# Patient Record
Sex: Male | Born: 1960
Health system: Southern US, Community
[De-identification: ages and names within clinical notes are randomized; demographics above are authoritative.]

## PROBLEM LIST (undated history)

## (undated) DIAGNOSIS — R011 Cardiac murmur, unspecified: Secondary | ICD-10-CM

## (undated) DIAGNOSIS — G4730 Sleep apnea, unspecified: Secondary | ICD-10-CM

## (undated) DIAGNOSIS — E785 Hyperlipidemia, unspecified: Secondary | ICD-10-CM

## (undated) DIAGNOSIS — K219 Gastro-esophageal reflux disease without esophagitis: Secondary | ICD-10-CM

## (undated) DIAGNOSIS — E119 Type 2 diabetes mellitus without complications: Secondary | ICD-10-CM

## (undated) DIAGNOSIS — D509 Iron deficiency anemia, unspecified: Secondary | ICD-10-CM

## (undated) DIAGNOSIS — R21 Rash and other nonspecific skin eruption: Secondary | ICD-10-CM

## (undated) DIAGNOSIS — I10 Essential (primary) hypertension: Secondary | ICD-10-CM

## (undated) HISTORY — DX: Gastro-esophageal reflux disease without esophagitis: K21.9

## (undated) HISTORY — DX: Hyperlipidemia, unspecified: E78.5

## (undated) HISTORY — PX: HAND SURGERY: SHX662

## (undated) HISTORY — DX: Iron deficiency anemia, unspecified: D50.9

## (undated) HISTORY — DX: Sleep apnea, unspecified: G47.30

---

## 1991-10-17 HISTORY — PX: BACK SURGERY: SHX140

## 1998-10-16 HISTORY — PX: CERVICAL SPINE SURGERY: SHX589

## 2001-03-04 ENCOUNTER — Encounter: Payer: Self-pay | Admitting: Neurosurgery

## 2001-03-04 ENCOUNTER — Inpatient Hospital Stay (HOSPITAL_COMMUNITY): Admission: RE | Admit: 2001-03-04 | Discharge: 2001-03-05 | Payer: Self-pay | Admitting: Neurosurgery

## 2001-04-02 ENCOUNTER — Encounter: Payer: Self-pay | Admitting: Neurosurgery

## 2001-04-02 ENCOUNTER — Encounter: Admission: RE | Admit: 2001-04-02 | Discharge: 2001-04-02 | Payer: Self-pay | Admitting: Neurosurgery

## 2001-06-04 ENCOUNTER — Encounter: Payer: Self-pay | Admitting: Neurosurgery

## 2001-06-04 ENCOUNTER — Encounter: Admission: RE | Admit: 2001-06-04 | Discharge: 2001-06-04 | Payer: Self-pay | Admitting: Neurosurgery

## 2003-10-27 ENCOUNTER — Encounter: Admission: RE | Admit: 2003-10-27 | Discharge: 2003-11-19 | Payer: Self-pay | Admitting: *Deleted

## 2003-11-18 ENCOUNTER — Ambulatory Visit (HOSPITAL_COMMUNITY): Admission: RE | Admit: 2003-11-18 | Discharge: 2003-11-18 | Payer: Self-pay | Admitting: Specialist

## 2005-01-23 ENCOUNTER — Emergency Department (HOSPITAL_COMMUNITY): Admission: EM | Admit: 2005-01-23 | Discharge: 2005-01-23 | Payer: Self-pay | Admitting: Family Medicine

## 2005-03-19 ENCOUNTER — Emergency Department (HOSPITAL_COMMUNITY): Admission: EM | Admit: 2005-03-19 | Discharge: 2005-03-19 | Payer: Self-pay | Admitting: Emergency Medicine

## 2011-08-03 ENCOUNTER — Emergency Department (HOSPITAL_COMMUNITY)
Admission: EM | Admit: 2011-08-03 | Discharge: 2011-08-04 | Disposition: A | Discharge status: Home / Self Care | Payer: Self-pay | Attending: Emergency Medicine | Admitting: Emergency Medicine

## 2011-08-03 ENCOUNTER — Emergency Department (HOSPITAL_COMMUNITY): Payer: Self-pay

## 2011-08-03 DIAGNOSIS — I10 Essential (primary) hypertension: Secondary | ICD-10-CM | POA: Insufficient documentation

## 2011-08-03 DIAGNOSIS — R55 Syncope and collapse: Secondary | ICD-10-CM | POA: Insufficient documentation

## 2011-08-03 DIAGNOSIS — F29 Unspecified psychosis not due to a substance or known physiological condition: Secondary | ICD-10-CM | POA: Insufficient documentation

## 2011-08-03 DIAGNOSIS — R569 Unspecified convulsions: Secondary | ICD-10-CM | POA: Insufficient documentation

## 2011-08-03 DIAGNOSIS — R51 Headache: Secondary | ICD-10-CM | POA: Insufficient documentation

## 2011-08-03 HISTORY — DX: Essential (primary) hypertension: I10

## 2011-08-03 LAB — COMPREHENSIVE METABOLIC PANEL
ALT: 46 U/L (ref 0–53)
AST: 35 U/L (ref 0–37)
Alkaline Phosphatase: 74 U/L (ref 39–117)
CO2: 26 mEq/L (ref 19–32)
Chloride: 98 mEq/L (ref 96–112)
Creatinine, Ser: 0.62 mg/dL (ref 0.50–1.35)
GFR calc non Af Amer: >90 mL/min (ref >90)
Potassium: 3.9 mEq/L (ref 3.5–5.1)
Sodium: 135 mEq/L (ref 135–145)
Total Bilirubin: 0.4 mg/dL (ref 0.3–1.2)

## 2011-08-03 LAB — CBC
HCT: 46.1 % (ref 39.0–52.0)
Hemoglobin: 16.3 g/dL (ref 13.0–17.0)
MCH: 31.1 pg (ref 26.0–34.0)
MCHC: 35.4 g/dL (ref 30.0–36.0)
MCV: 88 fL (ref 78.0–100.0)
Platelets: 162 10*3/uL (ref 150–400)
RBC: 5.24 MIL/uL (ref 4.22–5.81)
RDW: 12.2 % (ref 11.5–15.5)
WBC: 6.4 10*3/uL (ref 4.0–10.5)

## 2011-08-03 LAB — DIFFERENTIAL
Basophils Absolute: 0.1 10*3/uL (ref 0.0–0.1)
Basophils Relative: 1 % (ref 0–1)
Eosinophils Absolute: 0.3 10*3/uL (ref 0.0–0.7)
Eosinophils Relative: 4 % (ref 0–5)
Lymphocytes Relative: 32 % (ref 12–46)
Lymphs Abs: 2 10*3/uL (ref 0.7–4.0)
Monocytes Absolute: 0.6 10*3/uL (ref 0.1–1.0)
Monocytes Relative: 10 % (ref 3–12)
Neutro Abs: 3.5 10*3/uL (ref 1.7–7.7)
Neutrophils Relative %: 54 % (ref 43–77)

## 2011-08-04 ENCOUNTER — Encounter (HOSPITAL_COMMUNITY): Payer: Self-pay | Admitting: Radiology

## 2013-04-02 ENCOUNTER — Emergency Department (HOSPITAL_COMMUNITY): Payer: Self-pay

## 2013-04-02 ENCOUNTER — Emergency Department (HOSPITAL_COMMUNITY)
Admission: EM | Admit: 2013-04-02 | Discharge: 2013-04-02 | Disposition: A | Discharge status: Home / Self Care | Payer: Self-pay | Attending: Emergency Medicine | Admitting: Emergency Medicine

## 2013-04-02 ENCOUNTER — Encounter (HOSPITAL_COMMUNITY): Payer: Self-pay | Admitting: Emergency Medicine

## 2013-04-02 DIAGNOSIS — I10 Essential (primary) hypertension: Secondary | ICD-10-CM | POA: Insufficient documentation

## 2013-04-02 DIAGNOSIS — Z79899 Other long term (current) drug therapy: Secondary | ICD-10-CM | POA: Insufficient documentation

## 2013-04-02 DIAGNOSIS — N281 Cyst of kidney, acquired: Secondary | ICD-10-CM

## 2013-04-02 DIAGNOSIS — Q619 Cystic kidney disease, unspecified: Secondary | ICD-10-CM | POA: Insufficient documentation

## 2013-04-02 DIAGNOSIS — N200 Calculus of kidney: Secondary | ICD-10-CM | POA: Insufficient documentation

## 2013-04-02 DIAGNOSIS — R109 Unspecified abdominal pain: Secondary | ICD-10-CM | POA: Insufficient documentation

## 2013-04-02 DIAGNOSIS — E119 Type 2 diabetes mellitus without complications: Secondary | ICD-10-CM | POA: Insufficient documentation

## 2013-04-02 DIAGNOSIS — R110 Nausea: Secondary | ICD-10-CM | POA: Insufficient documentation

## 2013-04-02 DIAGNOSIS — Z791 Long term (current) use of non-steroidal anti-inflammatories (NSAID): Secondary | ICD-10-CM | POA: Insufficient documentation

## 2013-04-02 HISTORY — DX: Type 2 diabetes mellitus without complications: E11.9

## 2013-04-02 LAB — CBC WITH DIFFERENTIAL/PLATELET
Basophils Absolute: 0.1 10*3/uL (ref 0.0–0.1)
Basophils Relative: 1 % (ref 0–1)
Eosinophils Relative: 5 % (ref 0–5)
HCT: 44.3 % (ref 39.0–52.0)
Hemoglobin: 15.7 g/dL (ref 13.0–17.0)
Lymphocytes Relative: 36 % (ref 12–46)
MCHC: 35.4 g/dL (ref 30.0–36.0)
MCV: 87 fL (ref 78.0–100.0)
Monocytes Absolute: 0.6 10*3/uL (ref 0.1–1.0)
Monocytes Relative: 8 % (ref 3–12)
RDW: 12.1 % (ref 11.5–15.5)

## 2013-04-02 LAB — COMPREHENSIVE METABOLIC PANEL
AST: 25 U/L (ref 0–37)
BUN: 14 mg/dL (ref 6–23)
CO2: 24 mEq/L (ref 19–32)
Calcium: 9.5 mg/dL (ref 8.4–10.5)
Chloride: 100 mEq/L (ref 96–112)
Creatinine, Ser: 0.64 mg/dL (ref 0.50–1.35)
GFR calc non Af Amer: >90 mL/min (ref >90)
Total Bilirubin: 0.3 mg/dL (ref 0.3–1.2)

## 2013-04-02 LAB — LIPASE, BLOOD: Lipase: 54 U/L (ref 11–59)

## 2013-04-02 LAB — URINALYSIS, ROUTINE W REFLEX MICROSCOPIC
Bilirubin Urine: NEGATIVE
Glucose, UA: >1000 mg/dL — AB
Hgb urine dipstick: NEGATIVE
Ketones, ur: NEGATIVE mg/dL
Protein, ur: NEGATIVE mg/dL
Urobilinogen, UA: 0.2 mg/dL (ref 0.0–1.0)

## 2013-04-02 MED ORDER — TAMSULOSIN HCL 0.4 MG PO CAPS: 0.4000 mg | ORAL_CAPSULE | Freq: Two times a day (BID) | ORAL | Status: DC

## 2013-04-02 MED ORDER — HYDROCODONE-ACETAMINOPHEN 5-325 MG PO TABS: 1.0000 | ORAL_TABLET | Freq: Four times a day (QID) | ORAL | Status: DC | PRN

## 2013-04-02 MED ORDER — SODIUM CHLORIDE 0.9 % IV SOLN
Freq: Once | INTRAVENOUS | Status: AC
  Administered 2013-04-02: 02:00:00 via INTRAVENOUS

## 2013-04-02 MED ORDER — FENTANYL CITRATE 0.05 MG/ML IJ SOLN
50.0000 ug | Freq: Once | INTRAMUSCULAR | Status: AC
  Administered 2013-04-02: 50 ug via INTRAVENOUS
  Filled 2013-04-02: qty 2

## 2013-04-02 NOTE — ED Provider Notes (Signed)
History     CSN: [id number]  Arrival date & time 04/02/13  0005   First MD Initiated Contact with Patient 04/02/13 0157      Chief Complaint  Patient presents with  . Flank Pain    (Consider location/radiation/quality/duration/timing/severity/associated sxs/prior treatment) HPI Comments: R flank pain that radiates to R testicle pain waxes and wanes in intensity    Patient is a 52 y.o. male presenting with flank pain. The history is provided by the patient.  Flank Pain This is a recurrent problem. The current episode started yesterday. The problem occurs constantly. The problem has been gradually worsening. Associated symptoms include nausea. Pertinent negatives include no abdominal pain, change in bowel habit, congestion, coughing, diaphoresis, fever, headaches, myalgias, rash, urinary symptoms, vertigo or weakness. Nothing aggravates the symptoms. He has tried nothing for the symptoms. The treatment provided no relief.    Past Medical History  Diagnosis Date  . Hypertension   . Diabetes mellitus without complication     Past Surgical History  Procedure Laterality Date  . Back surgery    . Cervical spine surgery    . Hand surgery      Family History  Problem Relation Age of Onset  . CAD Other   . Cancer Other     History  Substance Use Topics  . Smoking status: Never Smoker   . Smokeless tobacco: Not on file  . Alcohol Use: No      Review of Systems  Constitutional: Negative for fever and diaphoresis.  HENT: Negative for congestion.   Respiratory: Negative for cough.   Gastrointestinal: Positive for nausea. Negative for abdominal pain and change in bowel habit.  Genitourinary: Positive for flank pain.  Musculoskeletal: Negative for myalgias.  Skin: Negative for rash.  Neurological: Negative for vertigo, weakness and headaches.    Allergies  Review of patient's allergies indicates no known allergies.  Home Medications   Current Outpatient Rx  Name   Route  Sig  Dispense  Refill  . loratadine (CLARITIN) 10 MG tablet   Oral   Take 10 mg by mouth daily.         . naproxen sodium (ANAPROX) 220 MG tablet   Oral   Take 440 mg by mouth 2 (two) times daily with a meal.         . potassium chloride SA (K-DUR,KLOR-CON) 20 MEQ tablet   Oral   Take 20 mEq by mouth 2 (two) times daily.         . ranitidine (ZANTAC) 75 MG tablet   Oral   Take 75 mg by mouth every evening.         Marland Kitchen HYDROcodone-acetaminophen (NORCO/VICODIN) 5-325 MG per tablet   Oral   Take 1 tablet by mouth every 6 (six) hours as needed for pain.   20 tablet   0     BP 141/58  Pulse 69  Temp(Src) 97.5 F (36.4 C) (Oral)  Resp 20  Wt 235 lb (106.595 kg)  SpO2 99%  Physical Exam  Nursing note and vitals reviewed. Constitutional: He appears well-developed and well-nourished.  HENT:  Head: Normocephalic.  Eyes: Pupils are equal, round, and reactive to light.  Neck: Normal range of motion.  Cardiovascular: Normal rate and regular rhythm.   Pulmonary/Chest: Effort normal and breath sounds normal.  Abdominal: Soft. Bowel sounds are normal. He exhibits no distension. There is no tenderness.  Musculoskeletal: Normal range of motion.  Neurological: He is alert.  Skin: Skin is warm.  ED Course  Procedures (including critical care time)  Labs Reviewed  URINALYSIS, ROUTINE W REFLEX MICROSCOPIC - Abnormal; Notable for the following:    Specific Gravity, Urine 1.045 (*)    Glucose, UA >1000 (*)    All other components within normal limits  COMPREHENSIVE METABOLIC PANEL - Abnormal; Notable for the following:    Glucose, Bld 293 (*)    All other components within normal limits  URINE MICROSCOPIC-ADD ON  CBC WITH DIFFERENTIAL  LIPASE, BLOOD   Ct Abdomen Wo Contrast  04/02/2013   *RADIOLOGY REPORT*  Clinical Data:  Right-sided flank pain.  CT ABDOMEN AND PELVIS WITHOUT CONTRAST  Technique:  Multidetector CT imaging of the abdomen and pelvis was performed  following the standard protocol without intravenous contrast.  Comparison:  No priors.  Findings:  Lung Bases: Atherosclerosis in the mid and distal right coronary artery and the left circumflex coronary artery.  Abdomen/Pelvis:  2 mm nonobstructive calculus in the upper pole collecting system of the left kidney.  No additional calculi are noted within the collecting system of the right kidney, along the course of either ureter, or within the lumen of the urinary bladder.  No hydroureteronephrosis or perinephric stranding to suggest urinary tract obstruction at this time.  A 1.7 cm low attenuation lesion in the interpolar region of the right kidney is incompletely characterized on today's noncontrast CT examination, but may represent a cyst.  The unenhanced appearance of the liver, gallbladder, pancreas, spleen and bilateral adrenal glands is unremarkable.  Normal appendix.  There are a few scattered colonic diverticula, without surrounding inflammatory changes to suggest acute diverticulitis at this time.  No significant volume of ascites.  No pneumoperitoneum. No pathologic distension of small bowel.  No definite pathologic lymphadenopathy identified within the abdomen or pelvis on today's noncontrast CT examination.  Prostate gland and urinary bladder are unremarkable.  Musculoskeletal: There are no aggressive appearing lytic or blastic lesions noted in the visualized portions of the skeleton.  IMPRESSION: 1.  2 mm nonobstructive calculus in the upper pole collecting system of the left kidney. 2.  No ureteral stones or findings of urinary tract obstruction at this time. 3.  Normal appendix. 4.  Mild colonic diverticulosis without evidence to suggest acute diverticulitis at this time. 5.  Atherosclerosis, including at least two-vessel coronary artery disease, as above. Please note that although the presence of coronary artery calcium documents the presence of coronary artery disease, the severity of this disease  and any potential stenosis cannot be assessed on this non-gated CT examination.  Assessment for potential risk factor modification, dietary therapy or pharmacologic therapy may be warranted, if clinically indicated.  6.  1.7 cm low attenuation area in the interpolar region of the right kidney is favored to represent a cyst, but is incompletely characterized on today's noncontrast CT examination.  This could be further evaluated with non emergent renal ultrasound if of clinical concern.   Original Report Authenticated By: Trudie Reed, M.D.     1. Renal cyst   2. Flank pain   3. Kidney stone       MDM    Will DC home with Hydrocodone and FU with urology        Arman Filter, NP 04/02/13 [phone]

## 2013-04-02 NOTE — ED Notes (Signed)
Pt states he is having right flank pain that started yesterday but became very severe about an hour ago  Pt has history of prostatitis but states this pain is worse than that

## 2013-04-04 NOTE — ED Provider Notes (Signed)
Medical screening examination/treatment/procedure(s) were performed by non-physician practitioner and as supervising physician I was immediately available for consultation/collaboration.  Sunnie Nielsen, MD 04/04/13 [phone]

## 2014-04-09 ENCOUNTER — Ambulatory Visit: Payer: Self-pay

## 2015-05-08 ENCOUNTER — Encounter (HOSPITAL_COMMUNITY): Payer: Self-pay | Admitting: Emergency Medicine

## 2015-05-08 ENCOUNTER — Emergency Department (HOSPITAL_COMMUNITY): Payer: Self-pay

## 2015-05-08 ENCOUNTER — Emergency Department (HOSPITAL_COMMUNITY)
Admission: EM | Admit: 2015-05-08 | Discharge: 2015-05-08 | Disposition: A | Discharge status: Home / Self Care | Payer: Self-pay | Attending: Emergency Medicine | Admitting: Emergency Medicine

## 2015-05-08 DIAGNOSIS — G8929 Other chronic pain: Secondary | ICD-10-CM | POA: Insufficient documentation

## 2015-05-08 DIAGNOSIS — R110 Nausea: Secondary | ICD-10-CM | POA: Insufficient documentation

## 2015-05-08 DIAGNOSIS — R0602 Shortness of breath: Secondary | ICD-10-CM | POA: Insufficient documentation

## 2015-05-08 DIAGNOSIS — E119 Type 2 diabetes mellitus without complications: Secondary | ICD-10-CM | POA: Insufficient documentation

## 2015-05-08 DIAGNOSIS — F1123 Opioid dependence with withdrawal: Secondary | ICD-10-CM | POA: Insufficient documentation

## 2015-05-08 DIAGNOSIS — R002 Palpitations: Secondary | ICD-10-CM | POA: Insufficient documentation

## 2015-05-08 DIAGNOSIS — Z791 Long term (current) use of non-steroidal anti-inflammatories (NSAID): Secondary | ICD-10-CM | POA: Insufficient documentation

## 2015-05-08 DIAGNOSIS — F1193 Opioid use, unspecified with withdrawal: Secondary | ICD-10-CM

## 2015-05-08 DIAGNOSIS — I10 Essential (primary) hypertension: Secondary | ICD-10-CM | POA: Insufficient documentation

## 2015-05-08 DIAGNOSIS — F419 Anxiety disorder, unspecified: Secondary | ICD-10-CM | POA: Insufficient documentation

## 2015-05-08 DIAGNOSIS — Z87891 Personal history of nicotine dependence: Secondary | ICD-10-CM | POA: Insufficient documentation

## 2015-05-08 DIAGNOSIS — Z79899 Other long term (current) drug therapy: Secondary | ICD-10-CM | POA: Insufficient documentation

## 2015-05-08 LAB — I-STAT TROPONIN, ED: TROPONIN I, POC: 0 ng/mL (ref 0.00–0.08)

## 2015-05-08 LAB — BASIC METABOLIC PANEL
Anion gap: 7 (ref 5–15)
BUN: 10 mg/dL (ref 6–20)
CALCIUM: 9.3 mg/dL (ref 8.9–10.3)
CO2: 26 mmol/L (ref 22–32)
CREATININE: 0.73 mg/dL (ref 0.61–1.24)
Chloride: 102 mmol/L (ref 101–111)
GFR calc Af Amer: >60 mL/min (ref >60)
Glucose, Bld: 251 mg/dL — ABNORMAL HIGH (ref 65–99)
POTASSIUM: 3.9 mmol/L (ref 3.5–5.1)
Sodium: 135 mmol/L (ref 135–145)

## 2015-05-08 LAB — CBC
HEMATOCRIT: 42.2 % (ref 39.0–52.0)
Hemoglobin: 14.8 g/dL (ref 13.0–17.0)
MCH: 30.9 pg (ref 26.0–34.0)
MCHC: 35.1 g/dL (ref 30.0–36.0)
MCV: 88.1 fL (ref 78.0–100.0)
Platelets: 163 10*3/uL (ref 150–400)
RBC: 4.79 MIL/uL (ref 4.22–5.81)
RDW: 12.4 % (ref 11.5–15.5)
WBC: 6 10*3/uL (ref 4.0–10.5)

## 2015-05-08 MED ORDER — GI COCKTAIL ~~LOC~~
30.0000 mL | Freq: Once | ORAL | Status: DC
  Filled 2015-05-08: qty 30

## 2015-05-08 MED ORDER — CLONIDINE HCL 0.1 MG PO TABS: 0.1000 mg | ORAL_TABLET | Freq: Two times a day (BID) | ORAL | Status: DC

## 2015-05-08 NOTE — ED Notes (Signed)
PT would not let me finish sticking him for an IV and labs.

## 2015-05-08 NOTE — ED Notes (Signed)
Pt states "I hope you can drive the wheelchair better than you stick."

## 2015-05-08 NOTE — ED Notes (Signed)
Unsuccessful attempt at IV during first attempt. Patient declined 2nd attempt at this time.

## 2015-05-08 NOTE — ED Notes (Signed)
Patient reports reaction to chemical trial drug "[id number] in opioid naive subjects with moderate to severe chronic low back pain". Patient reports the last time he took this medication was May 04, 2015. Side effects: chest pain, heart racing, feeling anxious. Reports pain on left side of chest, in a dull nature.

## 2015-05-08 NOTE — ED Notes (Signed)
Pt returned from XRAY. Phlebotomy aware he needs labs drawn.

## 2015-05-08 NOTE — ED Provider Notes (Addendum)
CSN: [id number]     Arrival date & time 05/08/15  7829 History   First MD Initiated Contact with Patient 05/08/15 [phone]     Chief Complaint  Patient presents with  . Chest Pain  . Anxiety     (Consider location/radiation/quality/duration/timing/severity/associated sxs/prior Treatment) HPI Comments: Patient is a 54 year old male with a prior history of chronic back pain who sees pain management. On June 25 started a new trial medication of an opiate-based medication that was not supposed to cross the blood-brain barrier. He had been increasing the dose slowly as directed by his physicians and then on July 17 developed a rash in his upper extremities feeling like his throat was closing and concern for an allergic reaction so he stopped the medication. He took one Benadryl and his symptoms resolved. A day later he started to notice a racing heart, feeling anxious, intermittent diaphoresis and occasional nausea. This is been ongoing for the last 4 days. This morning he woke up at 5 AM with sweating and now some left-sided chest pain. The chest pain has been persistent for over 2 hours now without any exacerbating symptoms. He currently is only taking Aleve and Tylenol. He also denies taking any type of blood pressure medication and has not taken anymore of the trial medication.  Patient is a 54 y.o. male presenting with chest pain and anxiety. The history is provided by the patient.  Chest Pain Pain location:  L chest Pain quality: aching   Pain radiates to:  Does not radiate Pain radiates to the back: no   Pain severity:  Moderate Onset quality:  Sudden Duration:  2 hours Timing:  Constant Progression:  Unchanged Chronicity:  New Relieved by:  None tried Worsened by:  Nothing tried Ineffective treatments:  None tried Associated symptoms: anxiety, diaphoresis, nausea, palpitations and shortness of breath   Associated symptoms: no cough, no dizziness, no dysphagia, no fever, no lower extremity  edema and not vomiting   Risk factors: male sex   Risk factors: no coronary artery disease, not obese, no smoking and no surgery   Risk factors comment:  No family hx of MI Anxiety Associated symptoms include chest pain and shortness of breath.    Past Medical History  Diagnosis Date  . Hypertension   . Diabetes mellitus without complication    Past Surgical History  Procedure Laterality Date  . Back surgery    . Cervical spine surgery    . Hand surgery     Family History  Problem Relation Age of Onset  . CAD Other   . Cancer Other    History  Substance Use Topics  . Smoking status: Former Smoker    Quit date: 10/16/1988  . Smokeless tobacco: Not on file  . Alcohol Use: No    Review of Systems  Constitutional: Positive for diaphoresis. Negative for fever.  HENT: Negative for trouble swallowing.   Respiratory: Positive for shortness of breath. Negative for cough.   Cardiovascular: Positive for chest pain and palpitations.  Gastrointestinal: Positive for nausea. Negative for vomiting.  Neurological: Negative for dizziness.  All other systems reviewed and are negative.     Allergies  Review of patient's allergies indicates no known allergies.  Home Medications   Prior to Admission medications   Medication Sig Start Date End Date Taking? Authorizing Provider  HYDROcodone-acetaminophen (NORCO/VICODIN) 5-325 MG per tablet Take 1 tablet by mouth every 6 (six) hours as needed for pain. 04/02/13   Earley Favor, NP  loratadine (  CLARITIN) 10 MG tablet Take 10 mg by mouth daily.    Historical Provider, MD  naproxen sodium (ANAPROX) 220 MG tablet Take 440 mg by mouth 2 (two) times daily with a meal.    Historical Provider, MD  potassium chloride SA (K-DUR,KLOR-CON) 20 MEQ tablet Take 20 mEq by mouth 2 (two) times daily.    Historical Provider, MD  ranitidine (ZANTAC) 75 MG tablet Take 75 mg by mouth every evening.    Historical Provider, MD  tamsulosin (FLOMAX) 0.4 MG CAPS  Take 1 capsule (0.4 mg total) by mouth 2 (two) times daily. 04/02/13   Kaitlyn Szekalski, PA-C   BP 198/91 mmHg  Pulse 62  Temp(Src) 98 F (36.7 C) (Oral)  Resp 18  Ht 5\' 10"  (1.778 m)  Wt 230 lb (104.327 kg)  BMI 33.00 kg/m2  SpO2 98% Physical Exam  Constitutional: He is oriented to person, place, and time. He appears well-developed and well-nourished. No distress.  HENT:  Head: Normocephalic and atraumatic.  Mouth/Throat: Oropharynx is clear and moist.  Eyes: Conjunctivae and EOM are normal. Pupils are equal, round, and reactive to light.  Neck: Normal range of motion. Neck supple.  Cardiovascular: Normal rate, regular rhythm and intact distal pulses.   No murmur heard. Pulmonary/Chest: Effort normal and breath sounds normal. No respiratory distress. He has no wheezes. He has no rales. He exhibits no tenderness.  Abdominal: Soft. He exhibits no distension. There is no tenderness. There is no rebound and no guarding.  Musculoskeletal: Normal range of motion. He exhibits no edema or tenderness.  Neurological: He is alert and oriented to person, place, and time.  Skin: Skin is warm and dry. No rash noted. No erythema.  Psychiatric: He has a normal mood and affect. His behavior is normal.  Nursing note and vitals reviewed.   ED Course  Procedures (including critical care time) Labs Review Labs Reviewed  BASIC METABOLIC PANEL - Abnormal; Notable for the following:    Glucose, Bld 251 (*)    All other components within normal limits  CBC  I-STAT TROPOININ, ED    Imaging Review Dg Chest 2 View  05/08/2015   CLINICAL DATA:  Left chest pain beginning 6:00 a.m. today.  EXAM: CHEST  2 VIEW  COMPARISON:  None.  FINDINGS: The lungs are clear. Heart size is normal. No pneumothorax pleural effusion. The patient is status post lower cervical fusion.  IMPRESSION: No acute disease.   Electronically Signed   By: Drusilla Kanner M.D.   On: 05/08/2015 08:00     EKG  Interpretation   Date/Time:  Saturday May 08 2015 07:03:47 EDT Ventricular Rate:  58 PR Interval:  159 QRS Duration: 95 QT Interval:  396 QTC Calculation: 389 R Axis:   78 Text Interpretation:  Sinus arrhythmia No significant change since last  tracing Confirmed by Anitra Lauth  MD, Aemilia Dedrick ([id number]) on 05/08/2015 7:28:53 AM      MDM   Final diagnoses:  Opiate withdrawal    Patient presenting with an atypical chest pain that started 2 hours ago when it woke him from sleep in a sweat. Patient recently has discontinued his study medication which was opiate-based less than a week ago when he started having an allergic reaction including a rash and feeling like his throat was closing. After taking Benadryl those symptoms went away and he has had no other symptoms such as that. However for the last 4-5 days he has had intermittent palpitations, feelings of anxiousness, intermittent sweating and occasional  nausea. Today was the first day he developed chest pain. He denies being a smoker and does not drink alcohol. He has been taking intermittent NSAIDs and Tylenol but denies any other medications. His blood pressure has been elevated which is also something new. Normally his blood pressure is in the 130s. Patient appears slightly uncomfortable on exam but has a normal EKG and a low risk for cardiac causes. Heart score 2.  Patient denies any infectious symptoms or respiratory symptoms. Strong suspicion the patient's symptoms are related to opiate withdrawal. He was taking 300 mg of this opiate-based medication and stopped cold [place]. He had been on the medication for over a month. Spoke with his pain specialist on the phone as well who also feels that is most likely withdrawal. We'll check a troponin, chest x-ray, Chem-8 and CBC. If all these are normal we'll treat him for withdrawal with clonidine and he will follow-up with his chronic pain specialist.  9:37 AM Labs and imaging within normal limits.  We'll discharge patient home with clonidine.  Gwyneth Sprout, MD 05/08/15 1478  Gwyneth Sprout, MD 05/08/15 [phone]

## 2015-05-08 NOTE — ED Notes (Signed)
Patient transported to X-ray 

## 2015-05-08 NOTE — ED Notes (Signed)
Patient states he has taken tylenol and aleve for pain management with no results.

## 2015-05-08 NOTE — ED Notes (Signed)
Pt verbally abusive toward staff. Pt stated "those registered nuts aren't going to stick me again". Phlebotomy was able to draw blood. Pt refusing IV at this time.

## 2016-03-16 DIAGNOSIS — R21 Rash and other nonspecific skin eruption: Secondary | ICD-10-CM

## 2016-03-16 HISTORY — DX: Rash and other nonspecific skin eruption: R21

## 2016-04-05 ENCOUNTER — Emergency Department (HOSPITAL_COMMUNITY)
Admission: EM | Admit: 2016-04-05 | Discharge: 2016-04-05 | Disposition: A | Discharge status: Home / Self Care | Payer: Self-pay | Attending: Emergency Medicine | Admitting: Emergency Medicine

## 2016-04-05 ENCOUNTER — Other Ambulatory Visit: Payer: Self-pay

## 2016-04-05 ENCOUNTER — Emergency Department (HOSPITAL_COMMUNITY): Payer: MEDICAID

## 2016-04-05 ENCOUNTER — Encounter (HOSPITAL_COMMUNITY): Payer: Self-pay | Admitting: *Deleted

## 2016-04-05 DIAGNOSIS — R21 Rash and other nonspecific skin eruption: Secondary | ICD-10-CM | POA: Insufficient documentation

## 2016-04-05 DIAGNOSIS — Z87891 Personal history of nicotine dependence: Secondary | ICD-10-CM | POA: Insufficient documentation

## 2016-04-05 DIAGNOSIS — Z791 Long term (current) use of non-steroidal anti-inflammatories (NSAID): Secondary | ICD-10-CM | POA: Insufficient documentation

## 2016-04-05 DIAGNOSIS — I10 Essential (primary) hypertension: Secondary | ICD-10-CM | POA: Insufficient documentation

## 2016-04-05 DIAGNOSIS — E119 Type 2 diabetes mellitus without complications: Secondary | ICD-10-CM | POA: Insufficient documentation

## 2016-04-05 DIAGNOSIS — R0602 Shortness of breath: Secondary | ICD-10-CM | POA: Insufficient documentation

## 2016-04-05 DIAGNOSIS — Z79899 Other long term (current) drug therapy: Secondary | ICD-10-CM | POA: Insufficient documentation

## 2016-04-05 LAB — CBC WITH DIFFERENTIAL/PLATELET
BASOS ABS: 0 10*3/uL (ref 0.0–0.1)
Basophils Relative: 1 %
Eosinophils Absolute: 0.1 10*3/uL (ref 0.0–0.7)
Eosinophils Relative: 2 %
HCT: 42.4 % (ref 39.0–52.0)
Hemoglobin: 14.1 g/dL (ref 13.0–17.0)
LYMPHS PCT: 22 %
Lymphs Abs: 0.8 10*3/uL (ref 0.7–4.0)
MCH: 29.6 pg (ref 26.0–34.0)
MCHC: 33.3 g/dL (ref 30.0–36.0)
MCV: 88.9 fL (ref 78.0–100.0)
Monocytes Absolute: 0.3 10*3/uL (ref 0.1–1.0)
Monocytes Relative: 8 %
NEUTROS ABS: 2.4 10*3/uL (ref 1.7–7.7)
NEUTROS PCT: 66 %
PLATELETS: 111 10*3/uL — AB (ref 150–400)
RBC: 4.77 MIL/uL (ref 4.22–5.81)
RDW: 12.4 % (ref 11.5–15.5)
WBC: 3.6 10*3/uL — AB (ref 4.0–10.5)

## 2016-04-05 LAB — BASIC METABOLIC PANEL
ANION GAP: 8 (ref 5–15)
BUN: 9 mg/dL (ref 6–20)
CO2: 26 mmol/L (ref 22–32)
Calcium: 9.2 mg/dL (ref 8.9–10.3)
Chloride: 102 mmol/L (ref 101–111)
Creatinine, Ser: 0.78 mg/dL (ref 0.61–1.24)
GFR calc Af Amer: >60 mL/min (ref >60)
Glucose, Bld: 305 mg/dL — ABNORMAL HIGH (ref 65–99)
Potassium: 3.7 mmol/L (ref 3.5–5.1)
SODIUM: 136 mmol/L (ref 135–145)

## 2016-04-05 LAB — I-STAT TROPONIN, ED: Troponin i, poc: 0 ng/mL (ref 0.00–0.08)

## 2016-04-05 MED ORDER — DIPHENHYDRAMINE HCL 25 MG PO CAPS
25.0000 mg | ORAL_CAPSULE | Freq: Once | ORAL | Status: AC
  Administered 2016-04-05: 25 mg via ORAL
  Filled 2016-04-05: qty 1

## 2016-04-05 MED ORDER — PREDNISONE 20 MG PO TABS: 40.0000 mg | ORAL_TABLET | Freq: Every day | ORAL | Status: DC

## 2016-04-05 MED ORDER — HYDROXYZINE HCL 25 MG PO TABS
25.0000 mg | ORAL_TABLET | Freq: Once | ORAL | Status: AC
  Administered 2016-04-05: 25 mg via ORAL
  Filled 2016-04-05: qty 1

## 2016-04-05 MED ORDER — PREDNISONE 20 MG PO TABS
60.0000 mg | ORAL_TABLET | Freq: Once | ORAL | Status: AC
  Administered 2016-04-05: 60 mg via ORAL
  Filled 2016-04-05: qty 3

## 2016-04-05 MED ORDER — DOXYCYCLINE HYCLATE 100 MG PO CAPS: 100.0000 mg | ORAL_CAPSULE | Freq: Two times a day (BID) | ORAL | Status: DC

## 2016-04-05 MED ORDER — HYDROXYZINE HCL 25 MG PO TABS: 25.0000 mg | ORAL_TABLET | Freq: Three times a day (TID) | ORAL | Status: DC | PRN

## 2016-04-05 MED FILL — DOXYCYCLINE 100 MG TABLET: 100 | 5 days supply | Qty: 10 | Fill #0

## 2016-04-05 MED FILL — ?HYDROXYZINE HCL 25 MG TAB: 25 | 7 days supply | Qty: 21 | Fill #0

## 2016-04-05 MED FILL — predniSONE 20 MG TABS: 20 | 8 days supply | Qty: 12 | Fill #0

## 2016-04-05 NOTE — ED Provider Notes (Signed)
CSN: [id number]     Arrival date & time 04/05/16  4098 History   First MD Initiated Contact with Patient 04/05/16 1014     Chief Complaint  Patient presents with  . Rash     (Consider location/radiation/quality/duration/timing/severity/associated sxs/prior Treatment) HPI Comments: Jeremy [name] is a 55 y.o. male with history of T2DM and hypertension presents to ED today with rash. Patient states he first noticed the rash approximately 2 days ago on his feet. The rash has since spread to his chest, back, and scalp. Patient states rash is extremely pruritic. He has tried benadryl without relief. He denies changes in soaps, lotions, or laundry detergent. Patient does endorse doing yard work over the weekend and does have dogs, does not believe he has been bit by a tick. No one else in house has similar rash. No known exposure to scabies or bed bugs. NO IVDU. No history of syphilis, HIV, or liver disease. He reports a fever of 101.7 and chills. No night sweats. He is afebrile in ED.   Of note, patient endorses SOB over the last few days. He denies cough or chest pain. No recent long distance travel/hospitalization/surgeries. No h/o blood clot. No h/o cancer or cancer treatment. No hemoptysis.   Patient is a 55 y.o. male presenting with rash. The history is provided by the patient and medical records.  Rash Associated symptoms: abdominal pain ( epigastric, "feels like I'm hungry"), fatigue, fever, shortness of breath and sore throat   Associated symptoms: no diarrhea, no headaches, no nausea and not vomiting     Past Medical History  Diagnosis Date  . Diabetes mellitus without complication (HCC)   . Hypertension    Past Surgical History  Procedure Laterality Date  . Back surgery    . Cervical spine surgery    . Hand surgery     Family History  Problem Relation Age of Onset  . CAD Other   . Cancer Other    Social History  Substance Use Topics  . Smoking status: Former Smoker    Quit  date: 10/16/1988  . Smokeless tobacco: None  . Alcohol Use: No    Review of Systems  Constitutional: Positive for fever, chills, diaphoresis and fatigue.  HENT: Positive for sore throat and trouble swallowing ( Patient is managing his oral secretions).   Eyes: Negative for visual disturbance.  Respiratory: Positive for shortness of breath. Negative for cough.   Cardiovascular: Positive for palpitations. Negative for chest pain.  Gastrointestinal: Positive for abdominal pain ( epigastric, "feels like I'm hungry"). Negative for nausea, vomiting, diarrhea and constipation.  Genitourinary: Negative for dysuria and hematuria.  Musculoskeletal: Negative for neck pain.  Skin: Positive for rash.  Neurological: Negative for weakness, numbness and headaches.      Allergies  Review of patient's allergies indicates no known allergies.  Home Medications   Prior to Admission medications   Medication Sig Start Date End Date Taking? Authorizing Provider  acetaminophen (TYLENOL) 500 MG tablet Take 500 mg by mouth 2 (two) times daily.   Yes Historical Provider, MD  diphenhydrAMINE (BENADRYL) 25 MG tablet Take 25 mg by mouth every 6 (six) hours as needed for itching or allergies.   Yes Historical Provider, MD  loratadine (CLARITIN) 10 MG tablet Take 10 mg by mouth daily.   Yes Historical Provider, MD  naproxen sodium (ANAPROX) 220 MG tablet Take 440 mg by mouth 2 (two) times daily with a meal.   Yes Historical Provider, MD  OVER THE  COUNTER MEDICATION Take 2 tablets by mouth at bedtime. "RESTLESS LEGS OTC"   Yes Historical Provider, MD  POTASSIUM GLUCONATE PO Take 1 tablet by mouth 2 (two) times daily.   Yes Historical Provider, MD  ranitidine (ZANTAC) 150 MG tablet Take 150 mg by mouth 2 (two) times daily.   Yes Historical Provider, MD  doxycycline (VIBRAMYCIN) 100 MG capsule Take 1 capsule (100 mg total) by mouth 2 (two) times daily. 04/05/16   Lona KettleAshley Laurel Haroon Shatto, PA-C  hydrOXYzine  (ATARAX/VISTARIL) 25 MG tablet Take 1 tablet (25 mg total) by mouth every 8 (eight) hours as needed for itching. 04/05/16   Lona KettleAshley Laurel Oaklyn Mans, PA-C  predniSONE (DELTASONE) 20 MG tablet Take 2 tablets (40 mg total) by mouth daily with breakfast. Take 40mg  daily for 4 days. Then take 20mg  daily for four days 04/05/16   Lona KettleAshley Laurel Sinclaire Artiga, PA-C   BP 144/60 mmHg  Pulse 80  Temp(Src) 98.2 F (36.8 C) (Oral)  Resp 16  Ht 5\' 10"  (1.778 m)  Wt 102.059 kg  BMI 32.28 kg/m2  SpO2 97% Physical Exam  Constitutional: He appears well-developed and well-nourished. No distress.  HENT:  Head: Normocephalic and atraumatic.  Right Ear: Tympanic membrane, external ear and ear canal normal.  Left Ear: Tympanic membrane, external ear and ear canal normal.  Mouth/Throat: Uvula is midline, oropharynx is clear and moist and mucous membranes are normal. No trismus in the jaw. No uvula swelling. No oropharyngeal exudate, posterior oropharyngeal edema, posterior oropharyngeal erythema or tonsillar abscesses.  Eyes: Conjunctivae and EOM are normal. Pupils are equal, round, and reactive to light. Right eye exhibits no discharge. Left eye exhibits no discharge. No scleral icterus.  Neck: Normal range of motion. Neck supple.  Cardiovascular: Normal rate, regular rhythm, normal heart sounds and intact distal pulses.   No murmur heard. Pulmonary/Chest: Effort normal and breath sounds normal. No respiratory distress. He has no wheezes. He has no rales.  Abdominal: Soft. Bowel sounds are normal. There is no tenderness. There is no rebound and no guarding.  Musculoskeletal: Normal range of motion.  Lymphadenopathy:    He has no cervical adenopathy.  Neurological: He is alert. Coordination normal.  Skin: Skin is warm and dry. Rash noted. He is not diaphoretic.  Diffuse erythematous papular rash noted on soles of feet, chest, back and scalp. Erosion of some papules noted. No purulent drainage or discharge seen. Lesions on  scalp have scaling.   Psychiatric: He has a normal mood and affect. His behavior is normal.    ED Course  Procedures (including critical care time) Labs Review Labs Reviewed  BASIC METABOLIC PANEL - Abnormal; Notable for the following:    Glucose, Bld 305 (*)    All other components within normal limits  CBC WITH DIFFERENTIAL/PLATELET - Abnormal; Notable for the following:    WBC 3.6 (*)    Platelets 111 (*)    All other components within normal limits  ROCKY MTN SPOTTED FVR ABS PNL(IGG+IGM)  B. BURGDORFI ANTIBODIES  I-STAT TROPOININ, ED    Imaging Review Dg Chest 2 View  04/05/2016  CLINICAL DATA:  Acute onset of hives today from unknown origin. Intermittent shortness of breath. History of hypertension and diabetes. EXAM: CHEST  2 VIEW COMPARISON:  05/08/2015 FINDINGS: The heart size and mediastinal contours are stable. The lungs are clear. There is no pleural effusion or pneumothorax. No acute osseous findings are seen status post lower cervical fusion. IMPRESSION: Stable chest.  No acute cardiopulmonary process. Electronically Signed  By: Carey Bullocks M.D.   On: 04/05/2016 13:21   I have personally reviewed and evaluated these images and lab results as part of my medical decision-making.   EKG Interpretation   Date/Time:  Wednesday April 05 2016 13:29:01 EDT Ventricular Rate:  77 PR Interval:  164 QRS Duration: 94 QT Interval:  360 QTC Calculation: 407 R Axis:   94 Text Interpretation:  Normal sinus rhythm Rightward axis ST abnormality,  possible digitalis effect Abnormal ECG Confirmed by ZAMMIT  MD, Jomarie Longs  [phone]) on 04/05/2016 1:31:27 PM Also confirmed by ZAMMIT  MD, Jomarie Longs  [phone]), editor [place] CT, Jola Babinski [phone])  on 04/05/2016 1:47:13 PM      MDM   Final diagnoses:  Rash  Shortness of breath   Patient is afebrile and non-toxic appearing. His vital signs are stable. Doubt scabies - no linear burrows or rash noted in interdigital web of hands or feet. Doubt  bed bugs - no linear appearance and no one else in house with rash. Concern for possible tick borne rash vs. Dermatitis. Check RMSF and lyme's labs. Will tx for possible tick borne infection. This will also help in covering for possible secondary bacterial infection from scratching. Steroids and vistaril also prescribed.   Regarding shortness of breath - Troponin negative. CXR negative. Well's negative, low suspicion for PE. CBC remarkable for mildly low WBC and platelets. BMP remarkable for elevated blood glucose. Discussed results with patient. Encouraged patient to establish a PCP, provided contact information for [facility]. Provided return precautions. Patient voiced understanding and is agreeable.     Lona Kettle, [place] 04/05/16 2104  Bethann Berkshire, MD 04/06/16 [phone]

## 2016-04-05 NOTE — ED Notes (Signed)
Pt here with rash that started on feet 2 night's ago as blisters on feet.  PT advises he now has rash covering his head, back, and chest.

## 2016-04-05 NOTE — Discharge Instructions (Signed)
Read the information below.   Your blood sugar is elevated today. Your white blood cell count and platelets are mildly low. Be sure to follow up with a primary care provider for further evaluation. Labs were sent to evaluate for [facility] spotted fever and Lyme's disease. If abnormal your be contacted regarding the results. In the meantime you are prescribed an antibiotic. Take as prescribed. You are also prescribed Vistaril and prednisone for itch relief. Use the prescribed medication as directed.  Please discuss all new medications with your pharmacist.   It is important that you establish a primary care provider for continuity of care. I provided the contact information for [place] [facility]. Be sure to schedule a follow-up appointment for reevaluation or rash. You may return to the Emergency Department at any time for worsening condition or any new symptoms that concern you. Return to ED if he develops chest pain, shortness of breath, or loss of consciousness.  [employer] Resource Guide Financial Assistance The United Ways 211 is a great source of information about community services available.  Access by dialing 2-1-1 from anywhere in [place] , or by website -  PooledIncome.plwww.nc211.org.   Other Local Resources (Updated 10/2015)  Financial Assistance   Services    Phone Number and Address  [facility]  Low-cost medical care - 1st and 3rd Saturday of every month  Must not qualify for public or private insurance and must have limited income [phone] [address] [place], [place]    [place] Department of Social Services  Child care  Emergency assistance for housing and [employer]ly[employer]Clarkutilities  Food stamps  Medicaid [phone] [address] [place], [place] [address]   [facility]  Low-cost medical care for children, communicable diseases, sexually-transmitted diseases, immunizations, maternity care, womens health  and family planning [phone] [address] [place], [place] [address]  [facility]   Medication assistance for [facility] [place] residents  Must meet income requirements [phone] [address] [place], [place].    [facility] Social Services  Child care  Emergency assistance for housing and Kimberly-Clarkutilities  Food stamps  Medicaid [phone] [address] [place], [place] [address]  [facility]   Low-cost medical care,   Monday through Friday, 9 am to 6 pm.   Accepts Medicare/Medicaid, and self-pay [phone] [address] [place], [place] [address]  [facility]  Low-cost medical care - Monday through Friday, 8:30 am - 5:30 pm  Accepts Medicaid and self-pay [phone] [address] [place], [place] [address]   [facility]  Primary medical care, including for those with sickle cell disease  Accepts Medicare, Medicaid, insurance and self-pay [phone] [address] [place], [place]  [facility]   Primary medical care  Accepts Medicare, [place], insurance and self-pay [phone] [address] [place], [place] [address]   [facility] Department of Social Services  Child care  Emergency assistance for housing and Kimberly-Clarkutilities  Food stamps  Medicaid [phone] [address] [place], [place] [address]  [facility] Department of Health and [employer]Human Services  Child care  Emergency assistance for housing and Kimberly-Clarkutilities  Food stamps  Medicaid [phone] [address] [place], [place] [address]   [facility] Medication Assistance Program  Medication assistance for [facility] residents with no insurance only  Must have a primary care doctor [phone] [address] [place], [place]  [facility]   Primary medical  care  [place], [place], insurance  [phone] [address] [place], [place]  MedAssist   Medication assistance [phone]  [facility]   Primary medical care  Accepts Medicare, [place], insurance and self-pay [phone] [address] [place], [place] [address]  [facility]   Primary medical care  Accepts Medicare, [place], insurance and self-pay [phone] [address] [place], [place] [address]  [facility]  For [place] residents between the ages of 63 and 49 who do not have any form of health insurance, Medicare, [place], or [place] benefits.  Services are provided free of charge to uninsured patients who fall within federal poverty guidelines.    Hours: Tuesdays and Thursdays, 4:15 - 8 pm [phone] [address] [place], [place] [address]  [facility]     Primary medical care  Dental care  Nutritional counseling  Pharmacy  Accepts Medicaid, Medicare, most insurance.  Fees are adjusted based on ability to pay.   [phone] [facility] [address] [place], [place]  [phone] [facility] [address] [place], [place]  [phone] [facility] [place], [place]  [phone] [facility], [address] [place], [place]  [phone] [facility] [address] [place], [place]  [facility]  Womens health and family planning [phone] [address] [place], [place]  [facility] Department of Social Services  Child care  Emergency assistance for housing and [employer]  Medicaid [phone] [address] [place], [place] [address]   [facility]    Ages 40 and older  Hours: Mondays and Thursdays, 7:00 am - 9:00 am Patients are seen on a first come, first served basis.  [phone], ext. [phone] [place], [place]  [facility] Division of Social Services  Child care  Emergency assistance for housing and [employer]  Medicaid [phone] 65 [place], [place] [address]  [facility]  Medication assistance  Rental assistance  Food pantry  Medication assistance  Housing assistance  Emergency food distribution  Utility assistance [phone] [address] [place], [place]  [phone]  [address] [place], [place] [address] Hours: Tuesdays and Thursdays from 9am - 12 noon by appointment only  [phone] [address] [place], [place] [address]  [facility] Lanae Boast   Accepts private insurance, [place], and [place].  Payment is based on a sliding scale for those without insurance.  Hours: Mondays, Tuesdays and Thursdays, 8:30 am - 5:30 pm.   [phone] [address] [place], [place]  [facility] - Family Medicine at [facility], [place], and [place].  Payment is based on a sliding scale for those without insurance. [phone] [address] [place], [place]  [facility] - Pediatrics at E. Scientist, research (physical sciences), [employer], and [place].  Payment is based on a sliding scale for those without insurance [phone] [address], [employer], [place]  [facility] - Pediatrics at [employer], [place], and [place].  Payment is based on a sliding scale for those without insurance. [phone] [address] [place], [place]  [facility] - Pediatrics at [facility],  Medicare, and Medicaid.  Payment is based on a sliding scale for those without insurance. [phone], ext. [phone] [place], [place].    [facility]  Outpatient Clinic  Maternity care.  Accepts Medicaid and self-pay. [phone] [address] [place], [place]

## 2016-04-06 LAB — B. BURGDORFI ANTIBODIES

## 2016-04-07 ENCOUNTER — Encounter (HOSPITAL_COMMUNITY): Payer: Self-pay | Admitting: Emergency Medicine

## 2016-04-07 ENCOUNTER — Other Ambulatory Visit: Payer: Self-pay

## 2016-04-07 ENCOUNTER — Observation Stay (HOSPITAL_COMMUNITY)
Admission: EM | Admit: 2016-04-07 | Discharge: 2016-04-08 | Discharge status: Against Medical Advice | Payer: MEDICAID | Attending: Internal Medicine | Admitting: Internal Medicine

## 2016-04-07 DIAGNOSIS — B9789 Other viral agents as the cause of diseases classified elsewhere: Secondary | ICD-10-CM | POA: Insufficient documentation

## 2016-04-07 DIAGNOSIS — R0602 Shortness of breath: Secondary | ICD-10-CM

## 2016-04-07 DIAGNOSIS — J069 Acute upper respiratory infection, unspecified: Secondary | ICD-10-CM

## 2016-04-07 DIAGNOSIS — Z87891 Personal history of nicotine dependence: Secondary | ICD-10-CM | POA: Insufficient documentation

## 2016-04-07 DIAGNOSIS — R21 Rash and other nonspecific skin eruption: Secondary | ICD-10-CM | POA: Diagnosis present

## 2016-04-07 DIAGNOSIS — I10 Essential (primary) hypertension: Secondary | ICD-10-CM | POA: Insufficient documentation

## 2016-04-07 DIAGNOSIS — J028 Acute pharyngitis due to other specified organisms: Secondary | ICD-10-CM

## 2016-04-07 DIAGNOSIS — R509 Fever, unspecified: Secondary | ICD-10-CM

## 2016-04-07 DIAGNOSIS — E1165 Type 2 diabetes mellitus with hyperglycemia: Secondary | ICD-10-CM | POA: Insufficient documentation

## 2016-04-07 DIAGNOSIS — E119 Type 2 diabetes mellitus without complications: Secondary | ICD-10-CM

## 2016-04-07 DIAGNOSIS — B029 Zoster without complications: Principal | ICD-10-CM | POA: Insufficient documentation

## 2016-04-07 DIAGNOSIS — D696 Thrombocytopenia, unspecified: Secondary | ICD-10-CM | POA: Insufficient documentation

## 2016-04-07 HISTORY — DX: Rash and other nonspecific skin eruption: R21

## 2016-04-07 LAB — LIPASE, BLOOD: LIPASE: 54 U/L — AB (ref 11–51)

## 2016-04-07 LAB — COMPREHENSIVE METABOLIC PANEL
ALK PHOS: 67 U/L (ref 38–126)
ALT: 184 U/L — ABNORMAL HIGH (ref 17–63)
AST: 165 U/L — AB (ref 15–41)
Albumin: 3.8 g/dL (ref 3.5–5.0)
Anion gap: 10 (ref 5–15)
BILIRUBIN TOTAL: 0.6 mg/dL (ref 0.3–1.2)
BUN: 14 mg/dL (ref 6–20)
CALCIUM: 9.9 mg/dL (ref 8.9–10.3)
CHLORIDE: 97 mmol/L — AB (ref 101–111)
CO2: 28 mmol/L (ref 22–32)
Creatinine, Ser: 0.95 mg/dL (ref 0.61–1.24)
Glucose, Bld: 382 mg/dL — ABNORMAL HIGH (ref 65–99)
Potassium: 4 mmol/L (ref 3.5–5.1)
Sodium: 135 mmol/L (ref 135–145)
Total Protein: 7.4 g/dL (ref 6.5–8.1)

## 2016-04-07 LAB — ROCKY MTN SPOTTED FVR ABS PNL(IGG+IGM)
RMSF IGM: 0.25 {index} (ref 0.00–0.89)
RMSF IgG: NEGATIVE

## 2016-04-07 LAB — CBC WITH DIFFERENTIAL/PLATELET
BASOS PCT: 3 %
Basophils Absolute: 0.2 10*3/uL — ABNORMAL HIGH (ref 0.0–0.1)
EOS PCT: 0 %
Eosinophils Absolute: 0 10*3/uL (ref 0.0–0.7)
HEMATOCRIT: 46.5 % (ref 39.0–52.0)
HEMOGLOBIN: 16.1 g/dL (ref 13.0–17.0)
LYMPHS ABS: 1.9 10*3/uL (ref 0.7–4.0)
LYMPHS PCT: 29 %
MCH: 31.2 pg (ref 26.0–34.0)
MCHC: 34.6 g/dL (ref 30.0–36.0)
MCV: 90.1 fL (ref 78.0–100.0)
Monocytes Absolute: 0.8 10*3/uL (ref 0.1–1.0)
Monocytes Relative: 12 %
NEUTROS ABS: 3.6 10*3/uL (ref 1.7–7.7)
Neutrophils Relative %: 56 %
Platelets: 135 10*3/uL — ABNORMAL LOW (ref 150–400)
RBC: 5.16 MIL/uL (ref 4.22–5.81)
RDW: 12.7 % (ref 11.5–15.5)
WBC: 6.5 10*3/uL (ref 4.0–10.5)

## 2016-04-07 LAB — CBG MONITORING, ED: Glucose-Capillary: 245 mg/dL — ABNORMAL HIGH (ref 65–99)

## 2016-04-07 LAB — URINE MICROSCOPIC-ADD ON: WBC UA: NONE SEEN WBC/hpf (ref 0–5)

## 2016-04-07 LAB — GC/CHLAMYDIA PROBE AMP (~~LOC~~) NOT AT ARMC
Chlamydia: NEGATIVE
Neisseria Gonorrhea: NEGATIVE

## 2016-04-07 LAB — RAPID STREP SCREEN (MED CTR MEBANE ONLY): Streptococcus, Group A Screen (Direct): NEGATIVE

## 2016-04-07 LAB — RAPID HIV SCREEN (HIV 1/2 AB+AG)
HIV 1/2 Antibodies: NONREACTIVE
HIV-1 P24 Antigen - HIV24: NONREACTIVE

## 2016-04-07 LAB — URINALYSIS, ROUTINE W REFLEX MICROSCOPIC
BILIRUBIN URINE: NEGATIVE
Glucose, UA: >1000 mg/dL — AB
HGB URINE DIPSTICK: NEGATIVE
KETONES UR: NEGATIVE mg/dL
Leukocytes, UA: NEGATIVE
NITRITE: NEGATIVE
Protein, ur: NEGATIVE mg/dL
Specific Gravity, Urine: 1.015 (ref 1.005–1.030)
pH: 5 (ref 5.0–8.0)

## 2016-04-07 LAB — I-STAT CG4 LACTIC ACID, ED
LACTIC ACID, VENOUS: 3.48 mmol/L — AB (ref 0.5–2.0)
LACTIC ACID, VENOUS: 1.89 mmol/L (ref 0.5–2.0)

## 2016-04-07 LAB — GLUCOSE, CAPILLARY: GLUCOSE-CAPILLARY: 341 mg/dL — AB (ref 65–99)

## 2016-04-07 LAB — RPR: RPR: NONREACTIVE

## 2016-04-07 LAB — PROTIME-INR
INR: 1.27 (ref 0.00–1.49)
Prothrombin Time: 16 seconds — ABNORMAL HIGH (ref 11.6–15.2)

## 2016-04-07 LAB — APTT: aPTT: 32 seconds (ref 24–37)

## 2016-04-07 LAB — MONONUCLEOSIS SCREEN: Mono Screen: NEGATIVE

## 2016-04-07 MED ORDER — IPRATROPIUM-ALBUTEROL 0.5-2.5 (3) MG/3ML IN SOLN
3.0000 mL | Freq: Once | RESPIRATORY_TRACT | Status: AC
  Administered 2016-04-07: 3 mL via RESPIRATORY_TRACT
  Filled 2016-04-07: qty 3

## 2016-04-07 MED ORDER — ACETAMINOPHEN 650 MG RE SUPP: 650.0000 mg | Freq: Four times a day (QID) | RECTAL | Status: DC | PRN

## 2016-04-07 MED ORDER — SODIUM CHLORIDE 0.9 % IV SOLN
INTRAVENOUS | Status: DC
  Administered 2016-04-07: 10:00:00 via INTRAVENOUS

## 2016-04-07 MED ORDER — ACETAMINOPHEN 325 MG PO TABS: 650.0000 mg | ORAL_TABLET | Freq: Four times a day (QID) | ORAL | Status: DC | PRN

## 2016-04-07 MED ORDER — ENOXAPARIN SODIUM 40 MG/0.4ML ~~LOC~~ SOLN
40.0000 mg | SUBCUTANEOUS | Status: DC
  Filled 2016-04-07: qty 0.4

## 2016-04-07 MED ORDER — VALACYCLOVIR HCL 500 MG PO TABS
1000.0000 mg | ORAL_TABLET | Freq: Three times a day (TID) | ORAL | Status: DC
  Administered 2016-04-07: 1000 mg via ORAL
  Filled 2016-04-07 (×2): qty 2

## 2016-04-07 MED ORDER — IPRATROPIUM-ALBUTEROL 0.5-2.5 (3) MG/3ML IN SOLN
3.0000 mL | Freq: Four times a day (QID) | RESPIRATORY_TRACT | Status: DC
  Administered 2016-04-07: 3 mL via RESPIRATORY_TRACT
  Filled 2016-04-07 (×2): qty 3

## 2016-04-07 MED ORDER — ALBUTEROL SULFATE (2.5 MG/3ML) 0.083% IN NEBU: 2.5000 mg | INHALATION_SOLUTION | RESPIRATORY_TRACT | Status: AC | PRN

## 2016-04-07 MED ORDER — DIPHENHYDRAMINE HCL 50 MG/ML IJ SOLN
25.0000 mg | Freq: Once | INTRAMUSCULAR | Status: AC
  Administered 2016-04-07: 25 mg via INTRAVENOUS
  Filled 2016-04-07: qty 1

## 2016-04-07 MED ORDER — ACETAMINOPHEN 500 MG PO TABS
1000.0000 mg | ORAL_TABLET | Freq: Once | ORAL | Status: AC
  Administered 2016-04-07: 1000 mg via ORAL
  Filled 2016-04-07: qty 2

## 2016-04-07 MED ORDER — LORAZEPAM 2 MG/ML IJ SOLN
1.0000 mg | INTRAMUSCULAR | Status: DC | PRN
Start: 2016-04-07 — End: 2016-04-08

## 2016-04-07 MED ORDER — ONDANSETRON HCL 4 MG/2ML IJ SOLN: 4.0000 mg | Freq: Four times a day (QID) | INTRAMUSCULAR | Status: DC | PRN

## 2016-04-07 MED ORDER — SODIUM CHLORIDE 0.9 % IV BOLUS (SEPSIS)
1000.0000 mL | Freq: Once | INTRAVENOUS | Status: AC
Start: 2016-04-07 — End: 2016-04-07
  Administered 2016-04-07: 1000 mL via INTRAVENOUS

## 2016-04-07 MED ORDER — METHYLPREDNISOLONE SODIUM SUCC 125 MG IJ SOLR
125.0000 mg | Freq: Once | INTRAMUSCULAR | Status: AC
  Administered 2016-04-07: 125 mg via INTRAVENOUS
  Filled 2016-04-07: qty 2

## 2016-04-07 MED ORDER — ONDANSETRON HCL 4 MG PO TABS
4.0000 mg | ORAL_TABLET | Freq: Four times a day (QID) | ORAL | Status: DC | PRN
Start: 2016-04-07 — End: 2016-04-08

## 2016-04-07 MED ORDER — INSULIN ASPART 100 UNIT/ML ~~LOC~~ SOLN
0.0000 [IU] | Freq: Three times a day (TID) | SUBCUTANEOUS | Status: DC
  Administered 2016-04-07: 11 [IU] via SUBCUTANEOUS

## 2016-04-07 MED ORDER — SODIUM CHLORIDE 0.9% FLUSH
3.0000 mL | Freq: Two times a day (BID) | INTRAVENOUS | Status: DC
  Administered 2016-04-07: 3 mL via INTRAVENOUS

## 2016-04-07 MED ORDER — SENNOSIDES-DOCUSATE SODIUM 8.6-50 MG PO TABS: 1.0000 | ORAL_TABLET | Freq: Every evening | ORAL | Status: DC | PRN

## 2016-04-07 MED ORDER — TRIAMCINOLONE ACETONIDE 0.1 % EX CREA
TOPICAL_CREAM | Freq: Four times a day (QID) | CUTANEOUS | Status: DC
  Administered 2016-04-07 (×2): via TOPICAL
  Filled 2016-04-07 (×3): qty 15

## 2016-04-07 MED ORDER — DOXYCYCLINE HYCLATE 100 MG PO TABS
100.0000 mg | ORAL_TABLET | Freq: Two times a day (BID) | ORAL | Status: DC
  Administered 2016-04-07: 100 mg via ORAL
  Filled 2016-04-07: qty 1

## 2016-04-07 MED ORDER — SODIUM CHLORIDE 0.9 % IV BOLUS (SEPSIS)
1000.0000 mL | Freq: Once | INTRAVENOUS | Status: AC
  Administered 2016-04-07: 1000 mL via INTRAVENOUS

## 2016-04-07 MED ORDER — FAMOTIDINE IN NACL 20-0.9 MG/50ML-% IV SOLN
20.0000 mg | Freq: Once | INTRAVENOUS | Status: AC
  Administered 2016-04-07: 20 mg via INTRAVENOUS
  Filled 2016-04-07: qty 50

## 2016-04-07 MED ORDER — LORAZEPAM 2 MG/ML IJ SOLN
1.0000 mg | Freq: Once | INTRAMUSCULAR | Status: AC
  Administered 2016-04-07: 1 mg via INTRAVENOUS
  Filled 2016-04-07: qty 1

## 2016-04-07 MED ORDER — SODIUM CHLORIDE 0.9 % IV SOLN: 250.0000 mL | INTRAVENOUS | Status: DC | PRN

## 2016-04-07 MED ORDER — SODIUM CHLORIDE 0.9% FLUSH: 3.0000 mL | INTRAVENOUS | Status: DC | PRN

## 2016-04-07 NOTE — Progress Notes (Signed)
Procedure Note  PRE-OP DIAGNOSIS:  Rash  POST-OP DIAGNOSIS: Same  PROCEDURE: shave biopsy  Performing Physician:  Dr Johnny BridgeSaraiya Supervising Physician: Dr. Oswaldo DoneVincent   PROCEDURE:  Shave Biopsy  The area surrounding the skin lesion on the left lateral thigh was cleaned and 1% lidocaine was used for local anesthesia. The lesion was removed in the usual manner by the biopsy method noted above. Hemostasis was assured and silver nitrate was applied. The patient tolerated the procedure well. There were no immediate complications.  Signed Quita SkyeParth A Coury Grieger, MD

## 2016-04-07 NOTE — ED Provider Notes (Signed)
CSN: [id number]     Arrival date & time 04/07/16  0004 History   First MD Initiated Contact with Patient 04/07/16 0344     Chief Complaint  Patient presents with  . Rash  . Urticaria     (Consider location/radiation/quality/duration/timing/severity/associated sxs/prior Treatment) The history is provided by the patient.     Patient is a 55 year old male with history of untreated DM, he return to the ER with persistent and worsening itchy rash and hives which was evaluated in the ER 2 days ago, treated with Doxy, Atarax and prednisone, without any improvement.  He states that nearly one week ago he began with vague symptoms including cough, scratchy throat, intermittent wheeziness and shortness of breath.  He then developed body aches, fever, hot and cold chills. 3 days ago he first noticed a red patch on his left hip which then spread to the bottom of his feet and then progressed all over his whole body.  His rash has areas red patches, some areas of smooth red bumps, and also red hives which have become most severe all over his face.  He denies any swelling of his lips, denies numbness tingling or metal taste in his mouth, no sensation of throat closure. He does have ulcers in his mouth located on his bottom lip, and he discontinued complain of sore throat. He also complains of abdomen pain that is generalized across his upper abdomen and left upper quadrant is intermittent and feels like hunger pains.  When he was evaluated 2 days ago he was worked up for tick borne illnesses, which was negative. He was covered with doxycycline for possible tickborne illness and to cover for any secondary infection from some of his excoriated rash.  Since leaving the ER he has been compliant with medications prescribed but he has also been taking 4-8 Benadryl tablets every 2-3 hours for severe itchiness.  He feels extremely hot.  He continues to cough with wheeze and feel short of breath with coughing spells and with  ambulation, he was evaluated for the two days ago and is scheduled to see pulmonology (per pt).  He denies any headache or neck stiffness. He denies any constipation, diarrhea, vomiting, nausea.  He is scheduled to [facility] next week to establish care.   He has a history of smoking but quit in 1990. He denies any illegal drug use. He denies alcohol use. He had a history of hepatitis A when he was 55 years old but otherwise denies any liver disease. He denies any recent new medications or stopped medications over the past 2-3 months, he states he only has over-the-counter medicines which she uses occasionally.  No known back or tick bites. His wife does not have any similar rash. No change to detergents, no new or suspicious foods.   Past Medical History  Diagnosis Date  . Diabetes mellitus without complication (HCC)   . Hypertension    Past Surgical History  Procedure Laterality Date  . Back surgery    . Cervical spine surgery    . Hand surgery     Family History  Problem Relation Age of Onset  . CAD Other   . Cancer Other    Social History  Substance Use Topics  . Smoking status: Former Smoker    Quit date: 10/16/1988  . Smokeless tobacco: None  . Alcohol Use: No    Review of Systems  Constitutional: Positive for fever, chills, diaphoresis and fatigue. Negative  for appetite change.  HENT: Positive for facial swelling, mouth sores and sore throat. Negative for congestion, postnasal drip, rhinorrhea and voice change.   Eyes: Negative.   Respiratory: Positive for cough, shortness of breath and wheezing.   Cardiovascular: Positive for palpitations. Negative for chest pain and leg swelling.  Gastrointestinal: Positive for abdominal pain. Negative for nausea, vomiting, diarrhea, constipation, blood in stool and abdominal distention.  Endocrine: Negative.   Genitourinary: Positive for urgency and frequency. Negative for dysuria, hematuria, decreased  urine volume, discharge, penile swelling, scrotal swelling, difficulty urinating, genital sores, penile pain and testicular pain.  Musculoskeletal: Positive for myalgias. Negative for back pain, joint swelling, arthralgias, gait problem, neck pain and neck stiffness.  Skin: Positive for color change, pallor (in extremeties, finger nails and feet) and rash. Negative for wound.  Allergic/Immunologic: Negative.   Neurological: Positive for headaches. Negative for dizziness, tremors, syncope, facial asymmetry, speech difficulty, weakness, light-headedness and numbness.  Hematological: Negative.   Psychiatric/Behavioral: The patient is hyperactive.   All other systems reviewed and are negative.     Allergies  Review of patient's allergies indicates no known allergies.  Home Medications   Prior to Admission medications   Medication Sig Start Date End Date Taking? Authorizing Provider  acetaminophen (TYLENOL) 500 MG tablet Take 500 mg by mouth 2 (two) times daily.   Yes Historical Provider, MD  diphenhydrAMINE (BENADRYL) 25 MG tablet Take 25 mg by mouth every 6 (six) hours as needed for itching or allergies.   Yes Historical Provider, MD  doxycycline (VIBRAMYCIN) 100 MG capsule Take 1 capsule (100 mg total) by mouth 2 (two) times daily. 04/05/16  Yes Lona Kettle, PA-C  hydrOXYzine (ATARAX/VISTARIL) 25 MG tablet Take 1 tablet (25 mg total) by mouth every 8 (eight) hours as needed for itching. 04/05/16  Yes Lona Kettle, PA-C  loratadine (CLARITIN) 10 MG tablet Take 10 mg by mouth daily.   Yes Historical Provider, MD  naproxen sodium (ANAPROX) 220 MG tablet Take 440 mg by mouth 2 (two) times daily with a meal.   Yes Historical Provider, MD  OVER THE COUNTER MEDICATION Take 2 tablets by mouth at bedtime. "RESTLESS LEGS OTC"   Yes Historical Provider, MD  POTASSIUM GLUCONATE PO Take 1 tablet by mouth 2 (two) times daily.   Yes Historical Provider, MD  predniSONE (DELTASONE) 20 MG tablet  Take 2 tablets (40 mg total) by mouth daily with breakfast. Take  daily for 4 days. Then take  daily for four days 04/05/16  Yes Lona Kettle, PA-C  ranitidine (ZANTAC) 150 MG tablet Take 150 mg by mouth 2 (two) times daily.   Yes Historical Provider, MD   BP 142/60 mmHg  Pulse 107  Temp(Src) 98.6 F (37 C) (Oral)  Resp 26  Ht  (1.778 m)  Wt 102.059 kg  BMI 32.28 kg/m2  SpO2 95% Physical Exam  Constitutional: He is oriented to person, place, and time. He appears well-developed and well-nourished. No distress.  HENT:  Head: Normocephalic and atraumatic.  Right Ear: External ear normal.  Left Ear: External ear normal.  Nose: Nose normal.  Mouth/Throat: Uvula is midline. Mucous membranes are dry and not cyanotic. Oral lesions present. Posterior oropharyngeal erythema present. No oropharyngeal exudate, posterior oropharyngeal edema or tonsillar abscesses.  Eyes: Conjunctivae and EOM are normal. Pupils are equal, round, and reactive to light. Right eye exhibits no discharge. Left eye exhibits no discharge. No scleral icterus.  Neck: Normal range of motion. No JVD present.  No tracheal deviation present. No thyromegaly present.  Cardiovascular: Normal rate, regular rhythm, normal heart sounds and intact distal pulses.  Exam reveals no gallop and no friction rub.   No murmur heard. Pulmonary/Chest: Effort normal. No respiratory distress. He has wheezes. He has no rales. He exhibits no tenderness.  Intermittent cough, expiratory wheeze  Abdominal: Soft. Bowel sounds are normal. He exhibits no distension and no mass. There is tenderness. There is no rebound and no guarding.  ttp in epigastrum w/ mild guarding  Musculoskeletal: Normal range of motion. He exhibits no edema or tenderness.  Lymphadenopathy:    He has no cervical adenopathy.  Neurological: He is alert and oriented to person, place, and time. He has normal reflexes. No cranial nerve deficit. He exhibits normal  muscle tone. Coordination normal.  Skin: Skin is warm and dry. Rash noted. He is not diaphoretic. There is erythema. No pallor.  Pale to cyanotic nailbeds bilaterally in EU with delayed capillary refill, cool to the touch CLammy pale   Psychiatric: He has a normal mood and affect. His behavior is normal. Judgment and thought content normal.  Nursing note and vitals reviewed.   ED Course  Procedures (including critical care time) Labs Review Labs Reviewed  CBC WITH DIFFERENTIAL/PLATELET - Abnormal; Notable for the following:    Platelets 135 (*)    Basophils Absolute 0.2 (*)    All other components within normal limits  COMPREHENSIVE METABOLIC PANEL - Abnormal; Notable for the following:    Chloride 97 (*)    Glucose, Bld 382 (*)    AST 165 (*)    ALT 184 (*)    All other components within normal limits  URINALYSIS, ROUTINE W REFLEX MICROSCOPIC (NOT AT [facility]) - Abnormal; Notable for the following:    Glucose, UA >1000 (*)    All other components within normal limits  URINE MICROSCOPIC-ADD ON - Abnormal; Notable for the following:    Squamous Epithelial / LPF 0-5 (*)    Bacteria, UA RARE (*)    All other components within normal limits  LIPASE, BLOOD - Abnormal; Notable for the following:    Lipase 54 (*)    All other components within normal limits  I-STAT CG4 LACTIC ACID, ED - Abnormal; Notable for the following:    Lactic Acid, Venous 3.48 (*)    All other components within normal limits  CBG MONITORING, ED - Abnormal; Notable for the following:    Glucose-Capillary 245 (*)    All other components within normal limits  RAPID STREP SCREEN (NOT AT [facility])  CULTURE, BLOOD (ROUTINE X 2)  CULTURE, BLOOD (ROUTINE X 2)  CULTURE, GROUP A STREP ([facility])  MONONUCLEOSIS SCREEN  RAPID HIV SCREEN (HIV 1/2 AB+AG)  HEPATITIS PANEL, ACUTE  RPR  ROCKY MTN SPOTTED FVR ABS PNL(IGG+IGM)  I-STAT CG4 LACTIC ACID, ED  GC/CHLAMYDIA PROBE AMP ([place]) NOT AT [facility]    Imaging Review Dg  Chest 2 View  04/05/2016  CLINICAL DATA:  Acute onset of hives today from unknown origin. Intermittent shortness of breath. History of hypertension and diabetes. EXAM: CHEST  2 VIEW COMPARISON:  05/08/2015 FINDINGS: The heart size and mediastinal contours are stable. The lungs are clear. There is no pleural effusion or pneumothorax. No acute osseous findings are seen status post lower cervical fusion. IMPRESSION: Stable chest.  No acute cardiopulmonary process. Electronically Signed   By: Carey Bullocks M.D.   On: 04/05/2016 13:21   I have personally reviewed and evaluated these images and  lab results as part of my medical decision-making.   EKG Interpretation   Date/Time:  Friday April 07 2016 04:31:58 EDT Ventricular Rate:  87 PR Interval:    QRS Duration: 89 QT Interval:  360 QTC Calculation: 433 R Axis:   81 Text Interpretation:  Sinus rhythm Probable left atrial enlargement  Baseline wander in lead(s) V3 Confirmed by HORTON  MD, COURTNEY ([id number]) on  04/07/2016 7:26:44 AM      MDM   6 days of multiple symptoms starting with vague sx including myalgias, arthralgias, fever and chills, then developed runny nose, ST, cough with wheeze and rash.  He also complained of SOB and intermittent abdominal pain.  He states he noticed a "bug bite" on his left thigh initially, which then spread to souls of feet, palms of hands, and eventually extremities, torso and face.  Rash is pruritic, unrelieved by benadryl.  No known bug or tick bite.  He was evaluated in the ER 2 days ago for his rash, workup was significant for leukopenia, from pancytopenia, hyperglycemia.  Patient was afebrile, well-appearing with stable vital signs rash was most concerning for possible tickborne illness versus dermatitis, RMSF and lyme labs obtained, and patient was discharged with doxycycline, steroids and Vistaril.  He complained of shortness of breath and cough, cardiac workup was negative and chest x-ray was negative, he was  then set up for follow-up [place] [facility].  He returns today for worsening rash unimproved despite with large frequent doses of Benadryl, patient also states he's been compliant steroids, Vistaril and doxycycline.  His wife states that he has had on behavior over the past 6 days including agitation, insomnia, talking rapidly.     On exam patient was extremely hyperactive, erythematous, with expiratory wheeze, no evidence of respiratory distress he did have oral ulcers, and rash includes wide distribution of multiple areas including palms and souls of feet, and multiple morphologies of rash. He did give conflicting history of dosing of Benadryl, once stated he took 200 mg at a time every 2 hours, later stated he took 2 benadryl PTA.  He took Tylenol at 8 PM and then presents to the ER little after midnight, temperature is 99.5 VSS.  He did become febrile while in the ER, was given a repeat dose of Tylenol.  Workup was significant for positive lactic acid 3.48, mildly elevated lipase, elevated AST/ALT, 165/184, Mild thrombocytopenia, WBC 6.5, mildly elevated absolute basophils, atypical lymphocytes morphology.  Pt has some cyanotic nail beds in UE when I first examined him, no oral cyanosis, no respiratory distress, normal oxygen saturation, capillary refill improved was brisk after receiving fluids. He was given Ativan and was able to rest and stop itching.    The case was discussed with Dr. Wilkie AyeHorton, who has personally seen and evaluated the pt, feels rash is most suspicious for viral etiology, tick born illness also possible, testing repeated (pt received 2 days of doxy), also disseminated GC/chlamydia/syphilis possible given distribution of rash.  Currently pending is blood cultures, GC chlamydia, RPR, RMSF, hepatitis panel.  Rapid HIV is negative, and a screen and strep screen is negative.  Dr. Wilkie AyeHorton advised that pt should be admitted for further workup and treatment.  Unassigned  paged for admission.  I spoke with Dr. Darl Pikesushil, who will admit from teaching service, Dr. Heide SparkNarendra attending.  I have asked re: abx  tx, they will first evaluate.   Final diagnoses:  Rash  Fever, unspecified fever cause  URI (upper respiratory infection)  SOB (shortness of breath)     Danelle Berry, PA-C 04/09/16 2037  Shon Baton, MD 04/10/16 2257

## 2016-04-07 NOTE — H&P (Signed)
Date: 04/07/2016               Patient Name:  Jeremy [name] MRN: [id number]  DOB: 19-Feb-1961 Age / Sex: 55 y.o., male   PCP: No Pcp Per Patient         Medical Service: Internal Medicine Teaching Service         Attending Physician: Dr. Aldine Contes, MD    First Contact: Dr. Tiburcio Pea Pager: [phone]  Second Contact: Dr. Posey Pronto Pager: [phone]       After Hours (After 5p/  First Contact Pager: [phone]  weekends / holidays): Second Contact Pager: [phone]   Chief Complaint: rash  History of Present Illness:   This is a 55 yo man with history of T2DM and HTn who is here for rash on his body.  He says that last Friday he was working in his yard cutting [place]. He was wearing long pants and shoes. Then Friday night, he noticed an itchy rash on his left lateral thigh.  Then the next day the rash was on his right foot sole.  And subsequently he developed rash over his trunk, underarms, and everywhere else.  He was evaluated in the ER on Wednesday 6/21, and there was concern for tick borne illness, and patient was sent home on doxycycline, vistaril, and prednisone. Since Wednesday, the rash has progressed per his wife and was also having some fevers.  He has been taking his doxycycline and taking both benadryl and vistaril. But both benadryl and vistaril has failed to help his rash.   He is having associated myalgias, joint pains, and flu like symptoms for few days, some blurry vision. He had low grade temp to 100.5, chills, some nausea. Decreased apetite. No chest pain, had some dyspnea and wheezing and he experiences pain when swallowing due to the palatal lesions. Denies any headaches or nuchal rigidity. Denies any dysuria, hematuria, or hematochezia. No penile discharge. He is only sexually active with his wife and no other partners.  He denies past history of chicken pox, and no vaccination for varicella. He denies any history oo travel, any tick bites, no change in soaps or lotion,  no scabies or bed bugs. No history of HIV.  He has a history of Hepatitis A when he was young.   Lab work up from Wednesday ER visit is: RMSF IgG is negative, IgM is < 0.25, borrelia antibody is negative, rapid HIV p24 antigen is negative, mono screen negative,   CBC is notable for mild thrombocytopenia to 135, and elevated AST and ALT of 165 and 184 respectively. Lactate of 3.8  FHL Mi in father, pancreatic cancer in mother . SH: former smoker, quit in 1990. No illicit drug use. No alcohol use.   Meds: Current Facility-Administered Medications  Medication Dose Route Frequency Provider Last Rate Last Dose  . LORazepam (ATIVAN) injection 1 mg  1 mg Intravenous Q2H PRN Delsa Grana, PA-C       Current Outpatient Prescriptions  Medication Sig Dispense Refill  . acetaminophen (TYLENOL) 500 MG tablet Take 500 mg by mouth 2 (two) times daily.    . diphenhydrAMINE (BENADRYL) 25 MG tablet Take 25 mg by mouth every 6 (six) hours as needed for itching or allergies.    Marland Kitchen doxycycline (VIBRAMYCIN) 100 MG capsule Take 1 capsule (100 mg total) by mouth 2 (two) times daily. 10 capsule 0  . hydrOXYzine (ATARAX/VISTARIL) 25 MG tablet Take 1 tablet (25 mg total) by mouth every 8 (eight)  hours as needed for itching. 21 tablet 0  . loratadine (CLARITIN) 10 MG tablet Take 10 mg by mouth daily.    . naproxen sodium (ANAPROX) 220 MG tablet Take 440 mg by mouth 2 (two) times daily with a meal.    . OVER THE COUNTER MEDICATION Take 2 tablets by mouth at bedtime. "RESTLESS LEGS OTC"    . POTASSIUM GLUCONATE PO Take 1 tablet by mouth 2 (two) times daily.    . predniSONE (DELTASONE) 20 MG tablet Take 2 tablets (40 mg total) by mouth daily with breakfast. Take 67m daily for 4 days. Then take 233mdaily for four days 12 tablet 0  . ranitidine (ZANTAC) 150 MG tablet Take 150 mg by mouth 2 (two) times daily.      Allergies: Allergies as of 04/07/2016  . (No Known Allergies)   Past Medical History  Diagnosis Date  .  Diabetes mellitus without complication ([facility]  . Hypertension    Past Surgical History  Procedure Laterality Date  . Back surgery    . Cervical spine surgery    . Hand surgery     Family History  Problem Relation Age of Onset  . CAD Father   . Pancreatic cancer Mother    Social History   Social History  . Marital Status: Married    Spouse Name: N/A  . Number of Children: N/A  . Years of Education: N/A   Occupational History  . Not on file.   Social History Main Topics  . Smoking status: Former Smoker    Quit date: 10/16/1988  . Smokeless tobacco: Not on file  . Alcohol Use: No  . Drug Use: No  . Sexual Activity: Not on file   Other Topics Concern  . Not on file   Social History Narrative   Volunteers as firefighter    Review of Systems: Pertinent items noted in HPI and remainder of comprehensive ROS otherwise negative.  Physical Exam: Blood pressure 142/60, pulse 100, temperature 98.6 F (37 C), temperature source Oral, resp. rate 19, height 5' 10" (1.778 m), weight 225 lb (102.059 kg), SpO2 95 %.  General:  A&O, in NAD.  HEENT: PERRLA, EOMI. Has palatal lesions and 2 lesions in oral mucosa Neck: supple, no lymphadenopathy CV: Regular rate, regular rhythm, no murmurs or rubs appreciated. Resp: equal and symmetric breath sounds, some wheezing Abdomen: soft, nontender, nondistended, +BS in all 4 quadrants GU: lesion around genital area. No penile discharge  Skin:  Pictures present in Dr. HoEliezer Bottomote       Lesions at various stages of healing all over the body, from the scalp to the feet.  Extremities: pulses intact b/l, no edema  Neurologic exam: MS: A&O  CN II-XII grossly intact DTRs: 2+ and symmetric. No hyperreflexia  Sensory: intact to light touch Motor: 5/5 strength in upper, 5/5 in lower extremities, normal muscle tone Psyc: pleasant personality, affect appropriate to mood, no focal deficits      Lab results: Results for orders placed or  performed during the hospital encounter of 04/07/16 (from the past 24 hour(s))  CBC with Differential     Status: Abnormal   Collection Time: 04/07/16 12:32 AM  Result Value Ref Range   WBC 6.5 4.0 - 10.5 K/uL   RBC 5.16 4.22 - 5.81 MIL/uL   Hemoglobin 16.1 13.0 - 17.0 g/dL   HCT 46.5 39.0 - 52.0 %   MCV 90.1 78.0 - 100.0 fL   MCH 31.2 26.0 - 34.0  pg   MCHC 34.6 30.0 - 36.0 g/dL   RDW 12.7 11.5 - 15.5 %   Platelets 135 (L) 150 - 400 K/uL   Neutrophils Relative % 56 %   Lymphocytes Relative 29 %   Monocytes Relative 12 %   Eosinophils Relative 0 %   Basophils Relative 3 %   Neutro Abs 3.6 1.7 - 7.7 K/uL   Lymphs Abs 1.9 0.7 - 4.0 K/uL   Monocytes Absolute 0.8 0.1 - 1.0 K/uL   Eosinophils Absolute 0.0 0.0 - 0.7 K/uL   Basophils Absolute 0.2 (H) 0.0 - 0.1 K/uL   WBC Morphology ATYPICAL LYMPHOCYTES   Comprehensive metabolic panel     Status: Abnormal   Collection Time: 04/07/16 12:32 AM  Result Value Ref Range   Sodium 135 135 - 145 mmol/L   Potassium 4.0 3.5 - 5.1 mmol/L   Chloride 97 (L) 101 - 111 mmol/L   CO2 28 22 - 32 mmol/L   Glucose, Bld 382 (H) 65 - 99 mg/dL   BUN 14 6 - 20 mg/dL   Creatinine, Ser 0.95 0.61 - 1.24 mg/dL   Calcium 9.9 8.9 - 10.3 mg/dL   Total Protein 7.4 6.5 - 8.1 g/dL   Albumin 3.8 3.5 - 5.0 g/dL   AST 165 (H) 15 - 41 U/L   ALT 184 (H) 17 - 63 U/L   Alkaline Phosphatase 67 38 - 126 U/L   Total Bilirubin 0.6 0.3 - 1.2 mg/dL   GFR calc non Af Amer >60 >60 mL/min   GFR calc Af Amer >60 >60 mL/min   Anion gap 10 5 - 15  Urinalysis, Routine w reflex microscopic (not at [facility])     Status: Abnormal   Collection Time: 04/07/16  1:27 AM  Result Value Ref Range   Color, Urine YELLOW YELLOW   APPearance CLEAR CLEAR   Specific Gravity, Urine 1.015 1.005 - 1.030   pH 5.0 5.0 - 8.0   Glucose, UA >1000 (A) NEGATIVE mg/dL   Hgb urine dipstick NEGATIVE NEGATIVE   Bilirubin Urine NEGATIVE NEGATIVE   Ketones, ur NEGATIVE NEGATIVE mg/dL   Protein, ur NEGATIVE  NEGATIVE mg/dL   Nitrite NEGATIVE NEGATIVE   Leukocytes, UA NEGATIVE NEGATIVE  Urine microscopic-add on     Status: Abnormal   Collection Time: 04/07/16  1:27 AM  Result Value Ref Range   Squamous Epithelial / LPF 0-5 (A) NONE SEEN   WBC, UA NONE SEEN 0 - 5 WBC/hpf   RBC / HPF 0-5 0 - 5 RBC/hpf   Bacteria, UA RARE (A) NONE SEEN  Mononucleosis screen     Status: None   Collection Time: 04/07/16  5:08 AM  Result Value Ref Range   Mono Screen NEGATIVE NEGATIVE  Rapid HIV screen (HIV 1/2 Ab+Ag)     Status: None   Collection Time: 04/07/16  5:08 AM  Result Value Ref Range   HIV-1 P24 Antigen - HIV24 NON REACTIVE NON REACTIVE   HIV 1/2 Antibodies NON REACTIVE NON REACTIVE   Interpretation (HIV Ag Ab)      A non reactive test result means that HIV 1 or HIV 2 antibodies and HIV 1 p24 antigen were not detected in the specimen.  Lipase, blood     Status: Abnormal   Collection Time: 04/07/16  5:08 AM  Result Value Ref Range   Lipase 54 (H) 11 - 51 U/L  I-Stat CG4 Lactic Acid, ED     Status: Abnormal   Collection Time: 04/07/16  5:13 AM  Result Value Ref Range   Lactic Acid, Venous 3.48 (HH) 0.5 - 2.0 mmol/L   Comment NOTIFIED PHYSICIAN   Rapid strep screen     Status: None   Collection Time: 04/07/16  5:44 AM  Result Value Ref Range   Streptococcus, Group A Screen (Direct) NEGATIVE NEGATIVE  CBG monitoring, ED     Status: Abnormal   Collection Time: 04/07/16  7:22 AM  Result Value Ref Range   Glucose-Capillary 245 (H) 65 - 99 mg/dL   Comment 1 Notify RN    Comment 2 Document in Chart   I-Stat CG4 Lactic Acid, ED     Status: None   Collection Time: 04/07/16  8:10 AM  Result Value Ref Range   Lactic Acid, Venous 1.89 0.5 - 2.0 mmol/L     Imaging results:  Dg Chest 2 View  04/05/2016  CLINICAL DATA:  Acute onset of hives today from unknown origin. Intermittent shortness of breath. History of hypertension and diabetes. EXAM: CHEST  2 VIEW COMPARISON:  05/08/2015 FINDINGS: The heart  size and mediastinal contours are stable. The lungs are clear. There is no pleural effusion or pneumothorax. No acute osseous findings are seen status post lower cervical fusion. IMPRESSION: Stable chest.  No acute cardiopulmonary process. Electronically Signed   By: Richardean Sale M.D.   On: 04/05/2016 13:21    Other results:   Assessment & Plan by Problem: Active Problems:   Rash  Rash: Patient has rash at varying stages of healing all over his body. The initial rash on his left lateral thigh is crusted and weeping. He also has small palatal lesions in his mouth. The rash spares his hands. He has not had varicella before and no one in family is ill.  Leading differentials are coxsackie virus, and tick borne illnesses. Drug reaction also on differential. Coxsackie virus because of the palatal lesions. His mild thrombocytopenia, and elevated LFTs make tick borne illness likely. No eosinophilia but atypical lymphocytes.   Lab work up from Wednesday ER visit is: RMSF IgG is negative, IgM is < 0.25, borrelia antibody is negative, rapid HIV p24 antigen is negative, mono screen negative. His behavioral change is likely due to the use of the steroids. He received ativan and solumedrol once in the ER.  Other differentials  TTP, ehriliosis, anaplasmosis, measles, viral hepatitis, parvovirus, roseola, vasculitis. Consider ESR.   -droplet precautions -NS 100 cc/hr -continuing doxycycline 100 mg BID  -AM CBC, CMET, PT, PTT -follow up GC , RPR,  -f/u blood cultures    T2DM -A1C -CBGs  HTN -monitor blood pressure  F NS 100cc/hr E N carb modified  DVT ppx: lovenox Code- full    Dispo: Disposition is deferred at this time, awaiting improvement of current medical problems. Anticipated discharge in approximately 2 day(s).   The patient does not have a current PCP (No Pcp Per Patient) and does not need an [facility] hospital follow-up appointment after discharge.  The patient does not have  transportation limitations that hinder transportation to clinic appointments.  Signed: Burgess Estelle, MD 04/07/2016, 8:54 AM

## 2016-04-07 NOTE — Progress Notes (Signed)
Pt refuses bed rail x1; advised it was safety protocol.  Pt still refused.

## 2016-04-07 NOTE — ED Provider Notes (Signed)
Medical screening examination/treatment/procedure(s) were conducted as a shared visit with non-physician practitioner(s) and myself.  I personally evaluated the patient during the encounter.   EKG Interpretation   Date/Time:  Friday April 07 2016 04:31:58 EDT Ventricular Rate:  87 PR Interval:    QRS Duration: 89 QT Interval:  360 QTC Calculation: 433 R Axis:   81 Text Interpretation:  Sinus rhythm Probable left atrial enlargement  Baseline wander in lead(s) V3 Confirmed by Pamla Pangle  MD, Jaysean Manville ([id number]) on  04/07/2016 7:26:44 AM      Patient presents with persistent rash and low-grade fevers at home. Was seen and evaluated 2 days ago for the same. Concern at that time for tick borne illness. RMSF titer sent. Patient was sent home on doxycycline, prednisone, and Vistaril. He reports persistent itchy rash that has now progressed. Initially it was over the palms and soles but now involves his face, trunk and genital area. He also has lesions in his mouth. He reports low-grade fevers at home to 100.4. Denies any sick contacts with similar symptoms. Thinks initially he may have gotten bitten on Friday on the left thigh. However, he did not see a tick. Denies any recent travel. He is nontoxic on exam. Temperature of 100.5. He has a rash at varying stages over his trunk, face, GU area, and the soles of his feet. He also has lesions in his mouth.  Considerations include chickenpox, coxsackie, tick borne illness, syphilis, disseminated GC. Repeat RMSF sent, RPR, blood cultures. Patient was placed on droplet precautions. Lab work notable for mild thrombocytopenia and mild elevation in LFTs which would be most consistent with tick borne illness: However, he has gotten worse on doxycycline. Lactate 3.48. Fluids ordered. He does not appear septic. Leading diagnosis at this time tick borne illness versus coxsackievirus given distribution in the hands or feet, mouth, and GU area. Will discuss with the admitting  hospitalist. Discuss broadening antibiotics with the admitting hospitalist. Will hold at this time.               Jeremy Batonourtney F Karleen Seebeck, MD 04/07/16 [phone]

## 2016-04-07 NOTE — Progress Notes (Signed)
Spoke to Dr. Drue SecondSnider who states that the "misc [facility] lab test" is a swab and does not have to be collected as it can be tested from the earlier swabs that had already been sent to lab.   Spoke to [employer] ControlsLisa, Main Lab, relaying above, and she states that she will call Dr. Drue SecondSnider to clarify this lab collection.

## 2016-04-07 NOTE — Progress Notes (Signed)
Scheduled neb not given, Informed by unit staff that patient left AMA

## 2016-04-07 NOTE — Consult Note (Signed)
[place]         Reason for Consult: Rash    Referring Physician: Internal Medicine Teaching Service  Active Problems:   Rash  HPI: Jeremy [name] is a 55 y.o. male with PMHx of HTN, hyperglycemia, nephrolithiasis, atherosclerosis, and cervical degenerative disc disease s/p cervical fusion who originally presented to the ED on 6/21 with complaint of rash.  Patient states that on 6/18 he developed a blister on his left hip then two blisters on his feet which spread to his head, back and chest and were intensely pruritic. Patient states his pruritic rash was associated with fever, chills, sore throat, and dysphagia. The lesions contain a clear liquid which scab over after rupturing. He denied any new soaps, detergents, or lotions. He denied any sick contacts, recent travel or known tick bites. Patient thinks he may have been bit on the leg on 6/16 but denies finding any ticks. He denies any history or family members with scabies or bedbugs. He denies any pets at home. He denied any IVDU. He denies every having chicken pox or being vaccinated for it in the past.  In the ED on 6/21, patient was afebrile, mildly hypertensive at 159/77, HR 80, RR 18, satting 99% on room air. Lbs showed no leukocytosis, but new mild thrombocytopenia at 111. BMET showed hyperglycemia. CXR was normal. Given concern for Lyme disease and RMSF, titers were obtained which showed Borrelia Burgdorferi antibody less than 0.91 and RMSF IgG negative, and IgM within normal range. Patient was discharged from the ED on Vistaril and Doxycycline.   Patient returned to the ED on 6/23 with persistent and progressive pruritic rash which had now spread more diffusely across his body. Vitals signs this morning showed fever of 100.5, hypertension at 168/85, HR 100, RR 20 and pulse ox of 98% on room air. CBC showed mild thrombocytopenia at 135, no leukocytosis. CMET revealed elevated AST (165) and ALT (184).  Group A Strep was negative. Lactive acid was elevated at 3.48 which improved to 1.89 without fluids. UA negative for infection, HIV non-reactive, BCx were drawn and are pending.   Patient was seen and examined this afternoon. He states he feels well- the pruritus is improving. He denies any other symptoms. He denies any prodrome of symptoms prior to rash such as fever, chills, malaise or pharyngitis. He is around his young grandchildren, but does not know of any of them being sick. He is unsure if he ever had chickenpox as a child. 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   He denies any alcohol, tobacco or illicit drug use.   Past Medical History  Diagnosis Date  . Diabetes mellitus without complication ([place])   . Hypertension   . Rash of entire body 03/2016    Allergies: No Known Allergies  MEDICATIONS: . doxycycline  100 mg Oral BID  . enoxaparin (LOVENOX) injection  40 mg Subcutaneous Q24H  . insulin aspart  0-15 Units Subcutaneous TID WC  . ipratropium-albuterol  3 mL Nebulization Q6H  . sodium chloride flush  3 mL Intravenous Q12H  . triamcinolone cream   Topical QID    Social History  Substance Use Topics  . Smoking status: Former Smoker    Quit date: 10/16/1988  . Smokeless tobacco: Never Used  . Alcohol Use: No    Family History  Problem Relation Age of Onset  . CAD Father   . Pancreatic cancer Mother     Review of Systems: General: Admits to fever, chills (resolved). Denies fatigue, change in appetite and diaphoresis.  HEENT: Admits to sore throat, dysphagia. Respiratory: Denies SOB, cough, chest tightness, and wheezing.   Cardiovascular: Denies chest pain and palpitations.  Gastrointestinal: Denies nausea, vomiting, abdominal pain, diarrhea, constipation Genitourinary: Denies dysuria, urgency, frequency. Endocrine: Denies hot or cold intolerance. Musculoskeletal:  Denies myalgias, joint swelling, arthralgias.  Skin: Admits to diffuse pruritic rash. Denies wounds.  Neurological: Denies dizziness, headaches, weakness, lightheadedness.  OBJECTIVE: Filed Vitals:   04/07/16 0730 04/07/16 0745 04/07/16 1404 04/07/16 1439  BP:    150/70  Pulse: 105 100  90  Temp:    98.2 F (36.8 C)  TempSrc:      Resp: 26 19  17   Height:      Weight:      SpO2: 93% 95% 92%    General: Vital signs reviewed.  Patient is well-developed and well-nourished, in no acute distress and cooperative with exam.  Head: Normocephalic and atraumatic. Eyes: Conjunctivae normal, no scleral icterus.  Mouth: Vesicular lesions on the hard palate on roof and floor of mouth.  Neck: Supple, trachea midline.  Cardiovascular: RRR, S1 normal, S2 normal, no murmurs, gallops, or rubs. Pulmonary/Chest: Mild expiratory wheezes, mild inspiratory crackles in bases. Abdominal: Soft, non-tender, non-distended, BS +, no organomegaly or guarding present.  Extremities: No lower extremity edema bilaterally, pulses symmetric and intact bilaterally.  Neurological: Awake and alert.  Skin: Diffuse vesicular rash on soles of feet, legs, hip (clustered vesicles on left hip), abdomen, chest, back, face, scalp and hard palate of mouth. Different stages- small, unruptured maculopapular to vesicles and scabbed over lesions. Clear fluid filled vesicles.  Psychiatric: Normal mood and affect. speech and behavior is normal. Cognition and memory are normal.   LABS: Results for orders placed or performed during the hospital encounter of 04/07/16 (from the past 48 hour(s))  GC/Chlamydia probe amp ([place])not at [facility]     Status: None   Collection Time: 04/07/16 12:00 AM  Result Value Ref Range   Chlamydia Negative     Comment: Normal Reference Range - Negative   Neisseria gonorrhea Negative     Comment: Normal Reference Range - Negative  CBC with Differential     Status: Abnormal   Collection Time: 04/07/16  12:32 AM  Result Value Ref Range   WBC 6.5 4.0 - 10.5 K/uL   RBC 5.16 4.22 - 5.81 MIL/uL   Hemoglobin 16.1 13.0 - 17.0 g/dL   HCT 46.5 39.0 - 52.0 %   MCV 90.1 78.0 - 100.0 fL   MCH 31.2 26.0 - 34.0 pg   MCHC 34.6 30.0 - 36.0 g/dL   RDW 12.7 11.5 - 15.5 %   Platelets 135 (L) 150 - 400 K/uL   Neutrophils Relative % 56 %   Lymphocytes Relative 29 %   Monocytes Relative 12 %   Eosinophils Relative 0 %   Basophils Relative 3 %   Neutro Abs 3.6 1.7 - 7.7 K/uL   Lymphs Abs 1.9 0.7 - 4.0 K/uL   Monocytes Absolute 0.8 0.1 - 1.0 K/uL   Eosinophils Absolute 0.0 0.0 - 0.7 K/uL   Basophils Absolute 0.2 (H) 0.0 - 0.1 K/uL   WBC Morphology ATYPICAL LYMPHOCYTES   Comprehensive metabolic panel     Status: Abnormal   Collection Time: 04/07/16 12:32 AM  Result Value Ref Range  Sodium 135 135 - 145 mmol/L   Potassium 4.0 3.5 - 5.1 mmol/L   Chloride 97 (L) 101 - 111 mmol/L   CO2 28 22 - 32 mmol/L   Glucose, Bld 382 (H) 65 - 99 mg/dL   BUN 14 6 - 20 mg/dL   Creatinine, Ser 0.95 0.61 - 1.24 mg/dL   Calcium 9.9 8.9 - 10.3 mg/dL   Total Protein 7.4 6.5 - 8.1 g/dL   Albumin 3.8 3.5 - 5.0 g/dL   AST 165 (H) 15 - 41 U/L   ALT 184 (H) 17 - 63 U/L   Alkaline Phosphatase 67 38 - 126 U/L   Total Bilirubin 0.6 0.3 - 1.2 mg/dL   GFR calc non Af Amer >60 >60 mL/min   GFR calc Af Amer >60 >60 mL/min    Comment: (NOTE) The eGFR has been calculated using the CKD EPI equation. This calculation has not been validated in all clinical situations. eGFR's persistently <60 mL/min signify possible Chronic Kidney Disease.    Anion gap 10 5 - 15  Urinalysis, Routine w reflex microscopic (not at [facility])     Status: Abnormal   Collection Time: 04/07/16  1:27 AM  Result Value Ref Range   Color, Urine YELLOW YELLOW   APPearance CLEAR CLEAR   Specific Gravity, Urine 1.015 1.005 - 1.030   pH 5.0 5.0 - 8.0   Glucose, UA >1000 (A) NEGATIVE mg/dL   Hgb urine dipstick NEGATIVE NEGATIVE   Bilirubin Urine NEGATIVE  NEGATIVE   Ketones, ur NEGATIVE NEGATIVE mg/dL   Protein, ur NEGATIVE NEGATIVE mg/dL   Nitrite NEGATIVE NEGATIVE   Leukocytes, UA NEGATIVE NEGATIVE  Urine microscopic-add on     Status: Abnormal   Collection Time: 04/07/16  1:27 AM  Result Value Ref Range   Squamous Epithelial / LPF 0-5 (A) NONE SEEN   WBC, UA NONE SEEN 0 - 5 WBC/hpf   RBC / HPF 0-5 0 - 5 RBC/hpf   Bacteria, UA RARE (A) NONE SEEN  Mononucleosis screen     Status: None   Collection Time: 04/07/16  5:08 AM  Result Value Ref Range   Mono Screen NEGATIVE NEGATIVE  Rapid HIV screen (HIV 1/2 Ab+Ag)     Status: None   Collection Time: 04/07/16  5:08 AM  Result Value Ref Range   HIV-1 P24 Antigen - HIV24 NON REACTIVE NON REACTIVE   HIV 1/2 Antibodies NON REACTIVE NON REACTIVE   Interpretation (HIV Ag Ab)      A non reactive test result means that HIV 1 or HIV 2 antibodies and HIV 1 p24 antigen were not detected in the specimen.  Lipase, blood     Status: Abnormal   Collection Time: 04/07/16  5:08 AM  Result Value Ref Range   Lipase 54 (H) 11 - 51 U/L  RPR     Status: None   Collection Time: 04/07/16  5:08 AM  Result Value Ref Range   RPR Ser Ql Non Reactive Non Reactive    Comment: (NOTE) Performed At: [facility] [address] [place], [place] [id number] Lindon Romp MD ME:[id number]   I-Stat CG4 Lactic Acid, ED     Status: Abnormal   Collection Time: 04/07/16  5:13 AM  Result Value Ref Range   Lactic Acid, Venous 3.48 (HH) 0.5 - 2.0 mmol/L   Comment NOTIFIED PHYSICIAN   Rapid strep screen     Status: None   Collection Time: 04/07/16  5:44 AM  Result Value Ref  Range   Streptococcus, Group A Screen (Direct) NEGATIVE NEGATIVE    Comment: (NOTE) A Rapid Antigen test may result negative if the antigen level in the sample is below the detection level of this test. The FDA has not cleared this test as a stand-alone test therefore the rapid antigen negative result has reflexed to a Group A Strep  culture.   CBG monitoring, ED     Status: Abnormal   Collection Time: 04/07/16  7:22 AM  Result Value Ref Range   Glucose-Capillary 245 (H) 65 - 99 mg/dL   Comment 1 Notify RN    Comment 2 Document in Chart   I-Stat CG4 Lactic Acid, ED     Status: None   Collection Time: 04/07/16  8:10 AM  Result Value Ref Range   Lactic Acid, Venous 1.89 0.5 - 2.0 mmol/L  APTT     Status: None   Collection Time: 04/07/16 10:48 AM  Result Value Ref Range   aPTT 32 24 - 37 seconds  Protime-INR     Status: Abnormal   Collection Time: 04/07/16 10:48 AM  Result Value Ref Range   Prothrombin Time 16.0 (H) 11.6 - 15.2 seconds   INR 1.27 0.00 - 1.49    MICRO: Rapid Group A Strep: Negative Group A Strep Cx: Pending BCx 6/23: Pending  Antibiotics: Doxycycline 6/21>>6/23  IMAGING: CXR 6/21: Stable chest. No acute cardiopulmonary process.  Assessment/Plan:   Mr. Wendorff is a 55 yo male with PMHx of HTN, hyperglycemia, nephrolithiasis, atherosclerosis, and cervical degenerative disc disease s/p cervical fusion who originally presented to the ED on 6/21 with complaint of rash and returned to the ED with worsening pruritc rash.   Diffuse Pruritic Vesicular Rash: Patient has had a 4-5 day history of a diffuse pruritic vesicular rash worsening over the course, and now improving. Pruritus and eruption of new lesions has improved. Vesicles are filled with clear fluid and scab over. Clinically, his rash seems most consistent with Varicella. He is unsure if he has ever had chickenpox as a child or if was ever exposed. 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 The rash normally appears about 15 days after exposure. Normally, varicella rash appears after a prodrome of symptoms which patient denies. His subclinical elevated liver enzymes can also be seen in Varicella infection. Consider treatment with  valacyclovir 1 gram TID as patient still has active infection to reduce the clinical course of disease. However, now that patient is 24 hours out of the infection and improving, he likely can be managed conservatively given he is immunocompetent. Other differentials include Coxsackievirus or other viral illnesses.  -Airborne and contact precautions -Consider Valacyclovir versus conservative approach  Elevated Liver Enzymes: AST 165 and ALT 184 on admission. Likely secondary to viral illness. LFTs were normal in 2012 and 2014. CT abdomen in 2014 showed a normal liver. He denies alcohol use. Abdomen is non-tender.  -Continue to monitor  Mild Thrombocytopenia: Baseline platelets are in the 160s. On 6/21, plts 111 and on 6/23 135. Likely secondary to viral infection. Patient has no know hepatic disease and CT abdomen in 2014 showed a normal liver. He denies alcohol use. -Continue to monitor  Martyn Malay, DO PGY-2 Internal Medicine Resident Pager # [phone] 04/07/2016 2:55 PM

## 2016-04-07 NOTE — ED Notes (Signed)
Admitting at bedside 

## 2016-04-07 NOTE — Progress Notes (Addendum)
Pt leaving AMA; CBG  At 1705 = 341, admin ordered Novolog 11 units.  Pt has history of DM2.  CBG at 0722 = 245.  After administering, pt stated that he was "not" diabetic.  Became violently angry, cursing, pushing bedside table across floor and into window.  Pt self-removed PIV and cardiac monitor. CCMD notified.   Notified and spoke to Dr. Johnny BridgeSaraiya, stating that pt left AMA, even though suspect diagnosis is chickenpox.  Dr. Johnny BridgeSaraiya stated to hold [facility], room 18 until 0001 for pt, just in case he decided to return to hospital.

## 2016-04-07 NOTE — ED Notes (Signed)
Pt. reports persistent itchy skin rashes / hives at face , body and extremities onset 2 days ago seen here this week prescribed with Vistaril , Prednisone and Doxycycline with no relief. Airway intact / respirations unlabored . Pt. added urinary frequency today .

## 2016-04-07 NOTE — Progress Notes (Signed)
Inpatient Diabetes Program Recommendations  AACE/ADA: New Consensus Statement on Inpatient Glycemic Control (2015)  Target Ranges:  Prepandial:   less than 140 mg/dL      Peak postprandial:   less than 180 mg/dL (1-2 hours)      Critically ill patients:  140 - 180 mg/dL   Lab Results  Component Value Date   GLUCAP 245* 04/07/2016    Review of Glycemic Control:  Results for Jeremy [name], Jeremy L (MRN [id number]) as of 04/07/2016 14:37  Ref. Range 04/07/2016 07:22  Glucose-Capillary Latest Ref Range: 65-99 mg/dL 409245 (H)    Diabetes history: Type 2 diabetes Outpatient Diabetes medications: None noted  Inpatient Diabetes Program Recommendations:    Called and discussed with MD.  Recommended Novolog correction scale tid with meals and HS while patient is in the hospital.   Will follow.  Thanks, Beryl MeagerJenny Delbert Darley, RN, BC-ADM Inpatient Diabetes Coordinator Pager [phone] (8a-5p)

## 2016-04-08 DIAGNOSIS — R509 Fever, unspecified: Secondary | ICD-10-CM

## 2016-04-08 DIAGNOSIS — J029 Acute pharyngitis, unspecified: Secondary | ICD-10-CM

## 2016-04-08 DIAGNOSIS — R0602 Shortness of breath: Secondary | ICD-10-CM

## 2016-04-08 LAB — HEMOGLOBIN A1C
HEMOGLOBIN A1C: 9.5 % — AB (ref 4.8–5.6)
MEAN PLASMA GLUCOSE: 226 mg/dL

## 2016-04-08 LAB — HEPATITIS PANEL, ACUTE
HCV AB: 0.2 {s_co_ratio} (ref 0.0–0.9)
HEP A IGM: NEGATIVE
HEP B S AG: NEGATIVE
Hep B C IgM: NEGATIVE

## 2016-04-09 LAB — CULTURE, GROUP A STREP (THRC)

## 2016-04-10 DIAGNOSIS — R0602 Shortness of breath: Secondary | ICD-10-CM | POA: Insufficient documentation

## 2016-04-10 DIAGNOSIS — R509 Fever, unspecified: Secondary | ICD-10-CM | POA: Insufficient documentation

## 2016-04-10 DIAGNOSIS — J028 Acute pharyngitis due to other specified organisms: Secondary | ICD-10-CM

## 2016-04-10 DIAGNOSIS — B9789 Other viral agents as the cause of diseases classified elsewhere: Secondary | ICD-10-CM | POA: Insufficient documentation

## 2016-04-10 LAB — VARICELLA-ZOSTER BY PCR: Varicella-Zoster, PCR: POSITIVE — AB

## 2016-04-11 ENCOUNTER — Telehealth: Payer: Self-pay | Admitting: Internal Medicine

## 2016-04-11 LAB — ROCKY MTN SPOTTED FVR ABS PNL(IGG+IGM)
RMSF IGM: 0.27 {index} (ref 0.00–0.89)
RMSF IgG: NEGATIVE

## 2016-04-11 NOTE — Telephone Encounter (Signed)
i spoke to the patient to let him know that his test came back + for virus that causes chicken pox. Informed him that it is contagious and close family members should follow up with their PCP to ensure they have immunity.   He mentioned that he left the hospital since he was treated poorly once he transferred to airborne isolation room. I recommended that he speak with patient services

## 2016-04-12 ENCOUNTER — Encounter: Payer: Self-pay | Admitting: Critical Care Medicine

## 2016-04-12 ENCOUNTER — Ambulatory Visit: Payer: Self-pay | Attending: Critical Care Medicine | Admitting: Critical Care Medicine

## 2016-04-12 VITALS — BP 146/81 | HR 67 | Temp 98.6°F | Resp 18

## 2016-04-12 DIAGNOSIS — B019 Varicella without complication: Secondary | ICD-10-CM | POA: Insufficient documentation

## 2016-04-12 DIAGNOSIS — B029 Zoster without complications: Secondary | ICD-10-CM | POA: Insufficient documentation

## 2016-04-12 DIAGNOSIS — E119 Type 2 diabetes mellitus without complications: Secondary | ICD-10-CM | POA: Insufficient documentation

## 2016-04-12 DIAGNOSIS — Z8249 Family history of ischemic heart disease and other diseases of the circulatory system: Secondary | ICD-10-CM | POA: Insufficient documentation

## 2016-04-12 DIAGNOSIS — Z79899 Other long term (current) drug therapy: Secondary | ICD-10-CM | POA: Insufficient documentation

## 2016-04-12 DIAGNOSIS — I10 Essential (primary) hypertension: Secondary | ICD-10-CM | POA: Insufficient documentation

## 2016-04-12 DIAGNOSIS — Z87891 Personal history of nicotine dependence: Secondary | ICD-10-CM | POA: Insufficient documentation

## 2016-04-12 LAB — CULTURE, BLOOD (ROUTINE X 2)
Culture: NO GROWTH
Culture: NO GROWTH

## 2016-04-12 LAB — GLUCOSE, POCT (MANUAL RESULT ENTRY): POC Glucose: 201 mg/dl — AB (ref 70–99)

## 2016-04-12 MED ORDER — METFORMIN HCL 500 MG PO TABS: 500.0000 mg | ORAL_TABLET | Freq: Two times a day (BID) | ORAL | Status: DC

## 2016-04-12 MED ORDER — CLONIDINE HCL 0.1 MG PO TABS: 0.1000 mg | ORAL_TABLET | Freq: Two times a day (BID) | ORAL | Status: DC

## 2016-04-12 MED ORDER — TRUE METRIX METER W/DEVICE KIT: 1.0000 | PACK | Freq: Three times a day (TID) | Status: DC

## 2016-04-12 MED ORDER — GLUCOSE BLOOD VI STRP: ORAL_STRIP | Status: DC

## 2016-04-12 MED ORDER — TRUEPLUS LANCETS 28G MISC: 1.0000 | Freq: Three times a day (TID) | Status: DC

## 2016-04-12 MED ORDER — FREESTYLE SYSTEM KIT: 1.0000 | PACK | Status: DC | PRN

## 2016-04-12 NOTE — Progress Notes (Signed)
Patient is here for ED FU SOB  Patient denies any SOB at this time.  Patient denies any pain.  Patient is here for Chicken Pox outbreak and Hypertension.  Patient has not taken medication today and patient has not eaten.

## 2016-04-12 NOTE — Assessment & Plan Note (Addendum)
DM 2  No complication A1c 9.5 chk cbg and A1c Start meformin 500 bid DM education

## 2016-04-12 NOTE — Assessment & Plan Note (Signed)
Acute varicella zoster infection with viremia Now resolving No further rx needed Avoid close contacts till last vesicle dries up No anti viral needed Case discussed with Dr Drue SecondSnider of ID F/u in two weeks Let family members know of exposure

## 2016-04-12 NOTE — Progress Notes (Signed)
Subjective:    Patient ID: Jeremy [name], male    DOB: 11/07/1960, 55 y.o.   MRN: [id number]  HPI 04/12/2016 Chief Complaint  Patient presents with  . Shortness of Breath   Post hosp visit for Varicella Zoster infection with rash.  Pt adm 6/21 but left AMA 6/23  Pt did receive a short course of Valtrex.  Pt here for post hosp f/u and to establish with PCP. Previous hx of CBGs elevated.  Hx of onset of rash one week ago, noted fever, some dyspnea, noted a cough, muscle aches and wheezing noted.  No GI issue.  Noted loss of appt.   One episode of emesis since d/c.  No real chest pain.  Some sinus and sore throat.  Since left hosp no fever.  No dyspnea now, just fatigued.  No hx of chicken pox. Nonsmoker.  No itching     Past Medical History  Diagnosis Date  . Diabetes mellitus without complication ([place])   . Hypertension   . Rash of entire body 03/2016     Family History  Problem Relation Age of Onset  . CAD Father   . Pancreatic cancer Mother      Social History   Social History  . Marital Status: Married    Spouse Name: N/A  . Number of Children: N/A  . Years of Education: N/A   Occupational History  . general contractor    Social History Main Topics  . Smoking status: Former Smoker    Quit date: 10/16/1988  . Smokeless tobacco: Never Used  . Alcohol Use: No  . Drug Use: No  . Sexual Activity: Not on file   Other Topics Concern  . Not on file   Social History Narrative   Volunteers as firefighter     No Known Allergies   Outpatient Prescriptions Prior to Visit  Medication Sig Dispense Refill  . acetaminophen (TYLENOL) 500 MG tablet Take 500 mg by mouth 2 (two) times daily.    . hydrOXYzine (ATARAX/VISTARIL) 25 MG tablet Take 1 tablet (25 mg total) by mouth every 8 (eight) hours as needed for itching. 21 tablet 0  . loratadine (CLARITIN) 10 MG tablet Take 10 mg by mouth daily.    . naproxen sodium (ANAPROX) 220 MG tablet Take 440 mg by mouth 2 (two)  times daily with a meal.    . OVER THE COUNTER MEDICATION Take 2 tablets by mouth at bedtime. "RESTLESS LEGS OTC"    . POTASSIUM GLUCONATE PO Take 1 tablet by mouth 2 (two) times daily.    . ranitidine (ZANTAC) 150 MG tablet Take 150 mg by mouth at bedtime.     . diphenhydrAMINE (BENADRYL) 25 MG tablet Take 25 mg by mouth every 6 (six) hours as needed for itching or allergies. Reported on 04/12/2016    . doxycycline (VIBRAMYCIN) 100 MG capsule Take 1 capsule (100 mg total) by mouth 2 (two) times daily. (Patient not taking: Reported on 04/12/2016) 10 capsule 0  . predniSONE (DELTASONE) 20 MG tablet Take 2 tablets (40 mg total) by mouth daily with breakfast. Take 28m daily for 4 days. Then take 245mdaily for four days (Patient not taking: Reported on 04/12/2016) 12 tablet 0   No facility-administered medications prior to visit.     Review of Systems  Constitutional: Positive for fever, appetite change and fatigue.  HENT: Positive for congestion, postnasal drip, rhinorrhea and sore throat. Negative for sinus pressure and sneezing.   Respiratory: Positive for  cough, shortness of breath and wheezing.   Skin: Positive for rash.        Objective:   Physical Exam  Filed Vitals:   04/12/16 0934  BP: 146/81  Pulse: 67  Temp: 98.6 F (37 C)  TempSrc: Oral  Resp: 18  SpO2: 97%    Gen: Pleasant, well-nourished, in no distress,  normal affect  [id number] lesion in mouth,  mouth clear, no postnasal drip  Neck: No JVD, no TMG, no carotid bruits  Lungs: No use of accessory muscles, no dullness to percussion, clear without rales or rhonchi  Cardiovascular: RRR, heart sounds normal, no murmur or gallops, no peripheral edema  Abdomen: soft and NT, no HSM,  BS normal  Musculoskeletal: No deformities, no cyanosis or clubbing  Neuro: alert, non focal  Skin: Warm,diffuse varicella lesions over entire body All crusting except for few on feet  No results found.'  HgbA1c 9.5 6/23       Assessment & Plan:  I personally reviewed all images and lab data in the [facility] system as well as any outside material available during this office visit and agree with the  radiology impressions.   Essential hypertension HTN  No renal disease Plan Resume clonidene 0.84m bid F/u with PCP to establish  Type 2 diabetes mellitus without complication, without long-term current use of insulin (HCC) DM 2  No complication AQ3F9.5 chk cbg and A1c Start meformin 500 bid DM education  Varicella-zoster infection Acute varicella zoster infection with viremia Now resolving No further rx needed Avoid close contacts till last vesicle dries up No anti viral needed Case discussed with Dr SBaxter Flatteryof ID F/u in two weeks Let family members know of exposure    GBrooxwas seen today for shortness of breath.  Diagnoses and all orders for this visit:  Varicella-zoster infection  Type 2 diabetes mellitus without complication, without long-term current use of insulin ([place]  Essential hypertension  Other orders -     cloNIDine (CATAPRES) 0.1 MG tablet; Take 1 tablet (0.1 mg total) by mouth 2 (two) times daily. -     metFORMIN (GLUCOPHAGE) 500 MG tablet; Take 1 tablet (500 mg total) by mouth 2 (two) times daily with a meal. -     glucose monitoring kit (FREESTYLE) monitoring kit; 1 each by Does not apply route as needed for other. Test glucose twice daily -     glucose blood test strip; Use as instructed    I had an extended discussion with the patient and or family lasting 15 minutes of a 25 minute visit including:  DM education HTN education  Need for f/u

## 2016-04-12 NOTE — Assessment & Plan Note (Signed)
HTN  No renal disease Plan Resume clonidene 0.1mg  bid F/u with PCP to establish

## 2016-04-12 NOTE — Discharge Summary (Signed)
Name: Jeremy AsaGary L [name] MRN: [id number] DOB: November 07, 1960 55 y.o. PCP: No Pcp Per Patient  Date of Admission: 04/07/2016  3:30 AM Date of Discharge: 04/07/2016 Attending Physician: Dr Heide Sparknarendra   Discharge Diagnosis: 1. Varicella zoster Active Problems:   Rash   Discharge Medications: Pt left AMA so discharge meds not reconciled    Medication List    ASK your doctor about these medications        acetaminophen 500 MG tablet  Commonly known as:  TYLENOL  Take 500 mg by mouth 2 (two) times daily.     diphenhydrAMINE 25 MG tablet  Commonly known as:  BENADRYL  Take 25 mg by mouth every 6 (six) hours as needed for itching or allergies. Reported on 04/12/2016     hydrOXYzine 25 MG tablet  Commonly known as:  ATARAX/VISTARIL  Take 1 tablet (25 mg total) by mouth every 8 (eight) hours as needed for itching.     loratadine 10 MG tablet  Commonly known as:  CLARITIN  Take 10 mg by mouth daily.     naproxen sodium 220 MG tablet  Commonly known as:  ANAPROX  Take 440 mg by mouth 2 (two) times daily with a meal.     OVER THE COUNTER MEDICATION  Take 2 tablets by mouth at bedtime. "RESTLESS LEGS OTC"     POTASSIUM GLUCONATE PO  Take 1 tablet by mouth 2 (two) times daily.     ranitidine 150 MG tablet  Commonly known as:  ZANTAC  Take 150 mg by mouth at bedtime.        Disposition and follow-up:   Mr.Jeremy [name] was [facility] in Stable condition.  At the hospital follow up visit please address:  1.  Varicella zoster viremia: VZV PCR positive. He apparently has followed up with community health and wellness. Continue to follow up with PCP.   T2DM: A1c of 9.5 . Follow up with PCP. May need to be started on metformin   2.  Labs / imaging needed at time of follow-up:   3.  Pending labs/ test needing follow-up:   Follow-up Appointments:   HPI: This is a 55 yo man with history of T2DM and HTn who is here for rash on his body.  He says  that last Friday he was working in his yard cutting [place] tree. He was wearing long pants and shoes. Then Friday night, he noticed an itchy rash on his left lateral thigh. Then the next day the rash was on his right foot sole. And subsequently he developed rash over his trunk, underarms, and everywhere else. He was evaluated in the ER on Wednesday 6/21, and there was concern for tick borne illness, and patient was sent home on doxycycline, vistaril, and prednisone. Since Wednesday, the rash has progressed per his wife and was also having some fevers.  He has been taking his doxycycline and taking both benadryl and vistaril. But both benadryl and vistaril has failed to help his rash.   He is having associated myalgias, joint pains, and flu like symptoms for few days, some blurry vision. He had low grade temp to 100.5, chills, some nausea. Decreased apetite. No chest pain, had some dyspnea and wheezing and he experiences pain when swallowing due to the palatal lesions. Denies any headaches or nuchal rigidity. Denies any dysuria, hematuria, or hematochezia. No penile discharge. He is only sexually active with his wife and no other partners. He denies past history  of chicken pox, and no vaccination for varicella. He denies any history oo travel, any tick bites, no change in soaps or lotion, no scabies or bed bugs. No history of HIV. He has a history of Hepatitis A when he was young.   Lab work up from Wednesday ER visit is: RMSF IgG is negative, IgM is < 0.25, borrelia antibody is negative, rapid HIV p24 antigen is negative, mono screen negative,   CBC is notable for mild thrombocytopenia to 135, and elevated AST and ALT of 165 and 184 respectively. Lactate of 3.8  FHL Mi in father, pancreatic cancer in mother . SH: former smoker, quit in 1990. No illicit drug use. No alcohol use.   Hospital Course by problem list: Active Problems:   Rash   Acute varicella zoster infection: Patient has rash at  varying stages of healing all over his body. The initial rash on his left lateral thigh is crusted and weeping. He also has small palatal lesions in his mouth. The rash spares his hands. He has not had varicella before and no one in family is ill. L Lab work up from Wednesday ER visit is: RMSF IgG is negative, IgM is < 0.25, borrelia antibody is negative, rapid HIV p24 antigen is negative, mono screen negative. His behavioral change is likely due to the use of the steroids. He received ativan and solumedrol once in the ER. A shave biopsy was performed. ID was consulted. His varicella zoster by PCR was POSITIVE. He was started on valtrex  Patient left AMA the evening of admission   Discharge Vitals:   BP 149/73 mmHg  Pulse 80  Temp(Src) 98.4 F (36.9 C) (Oral)  Resp 18  Ht 5\' 10"  (1.778 m)  Wt 222 lb 9.6 oz (100.971 kg)  BMI 31.94 kg/m2  SpO2 98%  Pertinent Labs, Studies, and Procedures:  VZV positive  Discharge Instructions:   Signed: Deneise LeverParth Iverson Sees, MD 04/12/2016, 3:54 PM

## 2016-04-12 NOTE — Patient Instructions (Addendum)
Start metformin one twice daily Start clonidene one twice daily Obtain a glucose meter and strips and check blood sugar twice daily Diabetic education will be obtained Stop prednisone and doxycycline Avoid contact until all vesicles scab over Let all family members know you have chicken pox Avoid public areas until your lesions all scab over Return for recheck in two weeks here A Primary Care MD will be obtained

## 2016-04-14 ENCOUNTER — Encounter: Payer: Self-pay | Admitting: Critical Care Medicine

## 2016-04-19 MED FILL — !TRUE METRIX BLOOD GLUCOSE: 1 days supply | Qty: 1 | Fill #0

## 2016-04-19 MED FILL — TRUEplus LANCETS 28G MISC: 25 days supply | Qty: 100 | Fill #0

## 2016-04-19 MED FILL — TRUE METRIX TEST STRIP: 30 days supply | Qty: 100 | Fill #0

## 2016-04-19 MED FILL — ?METFORMIN HCL 500MG TABLET: 500 | 30 days supply | Qty: 60 | Fill #0

## 2016-04-19 MED FILL — cloNIDine HCL 0.1 MG TABS: 0.1 | 30 days supply | Qty: 60 | Fill #0

## 2016-04-26 ENCOUNTER — Ambulatory Visit: Payer: Self-pay | Attending: Internal Medicine | Admitting: Pharmacist

## 2016-04-26 DIAGNOSIS — E119 Type 2 diabetes mellitus without complications: Secondary | ICD-10-CM

## 2016-04-26 MED ORDER — METFORMIN HCL 500 MG PO TABS: 1000.0000 mg | ORAL_TABLET | Freq: Two times a day (BID) | ORAL | Status: DC

## 2016-04-26 NOTE — Patient Instructions (Signed)
Thanks for coming to see me -sorry for the confusion!  Take 2 tablets of the metformin in the morning and 2 in the evening.  If this is too difficult for your schedule or if you get stomach upset, let me know and I can change it to one you only have to take once a day  Most important blood sugar to get is first thing in the morning right now but continue to get some throughout the day a few times a week  Schedule an appointment with a new primary care provider.

## 2016-04-27 ENCOUNTER — Encounter: Payer: Self-pay | Admitting: Pharmacist

## 2016-04-27 NOTE — Progress Notes (Signed)
    S:    Patient arrives in good spirits with his wife.  Presents for diabetes evaluation, education, and management at the request of Dr. Delford FieldWright. Patient was referred on 04/12/16.    Patient reports adherence with medications but does report that he will sometimes miss the morning dose of his metformin if he gets too busy.  Current diabetes medications include: metformin 500 mg BID   Patient denies hypoglycemic events.  Patient reported dietary habits: Eats 3 meals/day and received a lot of information last time he was here on decreasing his carbohydrate intake.   Patient reported exercise habits: none   Patient reports nocturia.  Patient denies neuropathy. Patient denies visual changes. Patient reports self foot exams.    O:  Lab Results  Component Value Date   HGBA1C 9.5* 04/07/2016   There were no vitals filed for this visit.  Home fasting CBG: 180s-200s 2 hour post-prandial/random CBG: 200s-314  A/P: Diabetes newly diagnosed currently UNcontrolled based on A1c of 9.5 but improving control based on CBGs in the upper 100s-200s. Patient denies hypoglycemic events and is able to verbalize appropriate hypoglycemia management plan. Patient reports adherence with medication. Control is suboptimal due to dietary indiscretion and sedentary lifestyle.  Increase metformin to 1000 mg BID. Reviewed the side effects of metformin, including GI upset, and to take the medication with food to prevent GI upset. Reviewed diabetes topics with him, including goal blood glucose levels, goal A1c, hypoglycemia management, importance of lifestyle changes such as decreased carbohydrate intake and exercise.  Next A1C anticipated 06/2016.    Written patient instructions provided.  Total time in face to face counseling 30 minutes.  Follow up in Pharmacist Clinic Visit PRN, next visit with new primary care provider.

## 2016-05-08 MED FILL — ?METFORMIN HCL 500MG TABLET: 500 | 30 days supply | Qty: 120 | Fill #0

## 2016-05-22 MED FILL — cloNIDine HCL 0.1 MG TABS: 0.1 | 30 days supply | Qty: 60 | Fill #1

## 2016-05-23 MED FILL — TRUE METRIX TEST STRIP: 30 days supply | Qty: 100 | Fill #1

## 2016-05-23 MED FILL — TRUEplus LANCETS 28G MISC: 25 days supply | Qty: 100 | Fill #1

## 2016-06-02 ENCOUNTER — Encounter: Payer: Self-pay | Admitting: Family Medicine

## 2016-06-02 ENCOUNTER — Ambulatory Visit: Payer: Self-pay | Attending: Family Medicine | Admitting: Family Medicine

## 2016-06-02 VITALS — BP 127/71 | HR 75 | Temp 97.9°F | Ht 67.5 in | Wt 226.2 lb

## 2016-06-02 DIAGNOSIS — Z7984 Long term (current) use of oral hypoglycemic drugs: Secondary | ICD-10-CM | POA: Insufficient documentation

## 2016-06-02 DIAGNOSIS — T887XXA Unspecified adverse effect of drug or medicament, initial encounter: Secondary | ICD-10-CM

## 2016-06-02 DIAGNOSIS — E119 Type 2 diabetes mellitus without complications: Secondary | ICD-10-CM | POA: Insufficient documentation

## 2016-06-02 DIAGNOSIS — T50905A Adverse effect of unspecified drugs, medicaments and biological substances, initial encounter: Secondary | ICD-10-CM

## 2016-06-02 DIAGNOSIS — I10 Essential (primary) hypertension: Secondary | ICD-10-CM | POA: Insufficient documentation

## 2016-06-02 LAB — GLUCOSE, POCT (MANUAL RESULT ENTRY): POC Glucose: 173 mg/dl — AB (ref 70–99)

## 2016-06-02 MED ORDER — GLIPIZIDE 10 MG PO TABS: 10.0000 mg | ORAL_TABLET | Freq: Two times a day (BID) | ORAL | 3 refills | Status: DC

## 2016-06-02 NOTE — Progress Notes (Signed)
Medication questions- "creeping and crawling skin"

## 2016-06-02 NOTE — Progress Notes (Signed)
Subjective:  Patient ID: Jeremy [name], male    DOB: 04-Feb-1961  Age: 55 y.o. MRN: [id number]  CC: Diabetes and Hypertension   HPI Jeremy [name] is a 55 year old male with a history of type 2 diabetes mellitus (A1c 9.5 from 03/2016), hypertension who comes into the clinic complaining that metformin is causing "creeping and crawling under his skin". She denies visual or auditory hallucinations.   He states the last 2 medications he was placed on were metformin and clonidine and he thinks this is coming from metformin. The sensation occurs in his right temple and then some other times on his elbow or in his arm but not generalized and he denies fever symptoms are described as mild. Also denies change of soaps or creams or lotions. Of note he was treated for varicella a couple of months ago but that has resolved.  I have reviewed his blood sugar log which reveals fasting sugars in the 137 -212 range and random sugars of 200-270  Past Medical History:  Diagnosis Date  . Diabetes mellitus without complication ([place])   . Hypertension   . Rash of entire body 03/2016    Past Surgical History:  Procedure Laterality Date  . BACK SURGERY    . CERVICAL SPINE SURGERY    . HAND SURGERY      No Known Allergies   Outpatient Medications Prior to Visit  Medication Sig Dispense Refill  . acetaminophen (TYLENOL) 500 MG tablet Take 500 mg by mouth 2 (two) times daily.    . Blood Glucose Monitoring Suppl (TRUE METRIX METER) w/Device KIT 1 each by Does not apply route 4 (four) times daily -  before meals and at bedtime. 1 kit 0  . cloNIDine (CATAPRES) 0.1 MG tablet Take 1 tablet (0.1 mg total) by mouth 2 (two) times daily. 60 tablet 4  . diphenhydrAMINE (BENADRYL) 25 MG tablet Take 25 mg by mouth every 6 (six) hours as needed for itching or allergies. Reported on 04/12/2016    . glucose blood (TRUE METRIX BLOOD GLUCOSE TEST) test strip Use as instructed 100 each 12  . glucose blood test strip Use  as instructed 100 each 12  . glucose monitoring kit (FREESTYLE) monitoring kit 1 each by Does not apply route as needed for other. Test glucose twice daily 1 each 0  . loratadine (CLARITIN) 10 MG tablet Take 10 mg by mouth daily.    . naproxen sodium (ANAPROX) 220 MG tablet Take 440 mg by mouth 2 (two) times daily with a meal.    . OVER THE COUNTER MEDICATION Take 2 tablets by mouth at bedtime. "RESTLESS LEGS OTC"    . POTASSIUM GLUCONATE PO Take 1 tablet by mouth 2 (two) times daily.    . ranitidine (ZANTAC) 150 MG tablet Take 150 mg by mouth at bedtime.     . TRUEPLUS LANCETS 28G MISC 1 each by Does not apply route 4 (four) times daily -  before meals and at bedtime. 100 each 12  . metFORMIN (GLUCOPHAGE) 500 MG tablet Take 2 tablets (1,000 mg total) by mouth 2 (two) times daily with a meal. 120 tablet 3  . hydrOXYzine (ATARAX/VISTARIL) 25 MG tablet Take 1 tablet (25 mg total) by mouth every 8 (eight) hours as needed for itching. (Patient not taking: Reported on 06/02/2016) 21 tablet 0   No facility-administered medications prior to visit.     ROS Review of Systems  Constitutional: Negative for activity change and appetite change.  HENT: Negative  for sinus pressure and sore throat.   Eyes: Negative for visual disturbance.  Respiratory: Negative for cough, chest tightness and shortness of breath.   Cardiovascular: Negative for chest pain and leg swelling.  Gastrointestinal: Negative for abdominal distention, abdominal pain, constipation and diarrhea.  Endocrine: Negative.   Genitourinary: Negative for dysuria.  Musculoskeletal: Negative for joint swelling and myalgias.  Skin:       See history of present illness   Allergic/Immunologic: Negative.   Neurological: Negative for weakness, light-headedness and numbness.  Psychiatric/Behavioral: Negative for dysphoric mood and suicidal ideas.    Objective:  BP 127/71 (BP Location: Right Arm, Patient Position: Sitting, Cuff Size: Large)    Pulse 75   Temp 97.9 F (36.6 C) (Oral)   Ht 5' 7.5" (1.715 m)   Wt 226 lb 3.2 oz (102.6 kg)   SpO2 99%   BMI 34.91 kg/m   BP/Weight 06/02/2016 04/12/2016 3/43/5686  Systolic BP 168 372 902  Diastolic BP 71 81 73  Wt. (Lbs) 226.2 - 222.6  BMI 34.91 - 31.94      Physical Exam  Constitutional: He is oriented to person, place, and time. He appears well-developed and well-nourished.  Cardiovascular: Normal rate, normal heart sounds and intact distal pulses.   No murmur heard. Pulmonary/Chest: Effort normal and breath sounds normal. He has no wheezes. He has no rales. He exhibits no tenderness.  Abdominal: Soft. Bowel sounds are normal. He exhibits no distension and no mass. There is no tenderness.  Musculoskeletal: Normal range of motion.  Neurological: He is alert and oriented to person, place, and time.     Assessment & Plan:   1. Type 2 diabetes mellitus without complication, without long-term current use of insulin (HCC) Uncontrolled with A1c of 9.5 Switch from metformin to glipizide given his conception that metformin is causing the current side effects We'll review blood sugar log at next visit Advised to apply for the [facility] discount in the event that he will need a medications from the patient assistance program He has expressed to me that he is not interested in going on insulin if his blood sugars are not controlled - Glucose (CBG) - glipiZIDE (GLUCOTROL) 10 MG tablet; Take 1 tablet (10 mg total) by mouth 2 (two) times daily before a meal.  Dispense: 60 tablet; Refill: 3  2. Essential hypertension Controlled Continue clonidine If symptoms persist we might have to substitute clonidine  3. Medication side effects, initial encounter Will work on the elimination to identify an inciting agent of skin sensation   Meds ordered this encounter  Medications  . glipiZIDE (GLUCOTROL) 10 MG tablet    Sig: Take 1 tablet (10 mg total) by mouth 2 (two) times daily before  a meal.    Dispense:  60 tablet    Refill:  3    Discontinue metformin    Follow-up: Return in about 2 weeks (around 06/16/2016) for follow up on medication change.   Arnoldo Morale MD

## 2016-06-15 MED FILL — ?GLIPIZIDE 10 MG TABLET: 10 | 30 days supply | Qty: 60 | Fill #0

## 2016-06-15 MED FILL — TRUE METRIX TEST STRIP: 30 days supply | Qty: 100 | Fill #2

## 2016-06-15 MED FILL — TRUEplus LANCETS 28G MISC: 25 days supply | Qty: 100 | Fill #2

## 2016-06-23 ENCOUNTER — Telehealth: Payer: Self-pay | Admitting: Internal Medicine

## 2016-06-23 MED ORDER — CLONIDINE HCL 0.1 MG PO TABS: 0.1000 mg | ORAL_TABLET | Freq: Two times a day (BID) | ORAL | 0 refills | Status: DC

## 2016-06-23 NOTE — Telephone Encounter (Signed)
Patient came in to office to pick up prescription but didn't realize that our pharmacy was closed. He needs a refill for his blood pressure medication. Please call it in to [facility] on [address].   Thank you

## 2016-06-23 NOTE — Telephone Encounter (Signed)
[facility]idine [facility] to [facility]

## 2016-07-18 MED FILL — glipiZIDE 10 MG TABS: 10 | 30 days supply | Qty: 60 | Fill #1

## 2016-07-28 MED FILL — cloNIDine HCL 0.1 MG TABS: 0.1 | 30 days supply | Qty: 60 | Fill #2

## 2016-08-21 MED FILL — TRUE METRIX TEST STRIP: 30 days supply | Qty: 100 | Fill #3

## 2016-08-21 MED FILL — TRUEplus LANCETS 28G MISC: 25 days supply | Qty: 100 | Fill #3

## 2016-08-23 MED FILL — ?GLIPIZIDE 10 MG TABLET: 10 | 30 days supply | Qty: 60 | Fill #2

## 2016-09-04 MED FILL — cloNIDine HCL 0.1 MG TABS: 0.1 | 30 days supply | Qty: 60 | Fill #3

## 2016-09-26 MED FILL — glipiZIDE 10 MG TABS: 10 | 30 days supply | Qty: 60 | Fill #3

## 2016-10-02 MED FILL — TRUE METRIX TEST STRIP: 30 days supply | Qty: 100 | Fill #4

## 2016-10-02 MED FILL — TRUEplus LANCETS 28G MISC: 25 days supply | Qty: 100 | Fill #4

## 2016-10-03 ENCOUNTER — Telehealth: Payer: Self-pay | Admitting: *Deleted

## 2016-10-03 NOTE — Telephone Encounter (Addendum)
Pt called to set up appointment to see physician about blood sugars. He stated for about 1 week his blood sugars have been fluctuating. He takes Glipizide but feels he had better control over blood sugars when he was taking Metformin. He stated his blood sugar this morning at 1200 a.m. Was 169. He had eaten tenderloin biscuit, eggs, hash browns, and gravy. This afternoon around 12 noon, "his  Breakfast"  Consisted of cajun filet biscuit meal and sweet tea. His blood sugar was 269. He stated in the past he has been able to tolerate this diet. Informed patient that he has apt 10/17/16 at 0930 with Dr. Julien NordmannLangeland.  He denies any urinary symptoms or dizziness. Just states he feels thirsty and worn out. Pt was encouraged to drink water and do moderate exercise to lower blood sugar. Also instructed to modify carbohydrates intake.  Encouraged to go to the Urgent Care or ED if he is not able to get blood sugar lower from 400 or becomes symptomatic were he feels bad before appointment.  Also instructed to call daily to for any cancellations. Pt verbalized understanding. Guy Francoravia Chaundra Abreu, RN

## 2016-10-04 NOTE — Telephone Encounter (Signed)
Great patient care, excellent documentation. Thank you

## 2016-10-17 ENCOUNTER — Ambulatory Visit: Payer: Self-pay | Attending: Internal Medicine | Admitting: Internal Medicine

## 2016-10-17 ENCOUNTER — Encounter: Payer: Self-pay | Admitting: Internal Medicine

## 2016-10-17 VITALS — BP 170/80 | HR 72 | Temp 98.5°F | Resp 16 | Wt 239.0 lb

## 2016-10-17 DIAGNOSIS — I10 Essential (primary) hypertension: Secondary | ICD-10-CM | POA: Insufficient documentation

## 2016-10-17 DIAGNOSIS — N401 Enlarged prostate with lower urinary tract symptoms: Secondary | ICD-10-CM | POA: Insufficient documentation

## 2016-10-17 DIAGNOSIS — R3911 Hesitancy of micturition: Secondary | ICD-10-CM | POA: Insufficient documentation

## 2016-10-17 DIAGNOSIS — E1143 Type 2 diabetes mellitus with diabetic autonomic (poly)neuropathy: Secondary | ICD-10-CM | POA: Insufficient documentation

## 2016-10-17 DIAGNOSIS — Z6836 Body mass index (BMI) 36.0-36.9, adult: Secondary | ICD-10-CM | POA: Insufficient documentation

## 2016-10-17 DIAGNOSIS — E079 Disorder of thyroid, unspecified: Secondary | ICD-10-CM | POA: Insufficient documentation

## 2016-10-17 DIAGNOSIS — Z114 Encounter for screening for human immunodeficiency virus [HIV]: Secondary | ICD-10-CM | POA: Insufficient documentation

## 2016-10-17 DIAGNOSIS — Z1329 Encounter for screening for other suspected endocrine disorder: Secondary | ICD-10-CM

## 2016-10-17 DIAGNOSIS — N529 Male erectile dysfunction, unspecified: Secondary | ICD-10-CM | POA: Insufficient documentation

## 2016-10-17 DIAGNOSIS — Z8249 Family history of ischemic heart disease and other diseases of the circulatory system: Secondary | ICD-10-CM | POA: Insufficient documentation

## 2016-10-17 DIAGNOSIS — Z87891 Personal history of nicotine dependence: Secondary | ICD-10-CM | POA: Insufficient documentation

## 2016-10-17 DIAGNOSIS — R109 Unspecified abdominal pain: Secondary | ICD-10-CM | POA: Insufficient documentation

## 2016-10-17 LAB — CBC WITH DIFFERENTIAL/PLATELET
BASOS PCT: 1 %
Basophils Absolute: 64 cells/uL (ref 0–200)
Eosinophils Absolute: 384 cells/uL (ref 15–500)
Eosinophils Relative: 6 %
HCT: 48.4 % (ref 38.5–50.0)
Hemoglobin: 16.3 g/dL (ref 13.2–17.1)
Lymphocytes Relative: 35 %
Lymphs Abs: 2240 cells/uL (ref 850–3900)
MCH: 30.8 pg (ref 27.0–33.0)
MCHC: 33.7 g/dL (ref 32.0–36.0)
MCV: 91.5 fL (ref 80.0–100.0)
MONO ABS: 448 {cells}/uL (ref 200–950)
MONOS PCT: 7 %
MPV: 11.3 fL (ref 7.5–12.5)
Neutro Abs: 3264 cells/uL (ref 1500–7800)
Neutrophils Relative %: 51 %
PLATELETS: 212 10*3/uL (ref 140–400)
RBC: 5.29 MIL/uL (ref 4.20–5.80)
RDW: 13.3 % (ref 11.0–15.0)
WBC: 6.4 10*3/uL (ref 3.8–10.8)

## 2016-10-17 LAB — GLUCOSE, POCT (MANUAL RESULT ENTRY): POC Glucose: 124 mg/dl — AB (ref 70–99)

## 2016-10-17 LAB — TSH: TSH: 1.34 mIU/L (ref 0.40–4.50)

## 2016-10-17 LAB — POCT GLYCOSYLATED HEMOGLOBIN (HGB A1C): HEMOGLOBIN A1C: 7.4

## 2016-10-17 MED ORDER — SILDENAFIL CITRATE 50 MG PO TABS: 50.0000 mg | ORAL_TABLET | Freq: Every day | ORAL | 0 refills | Status: DC | PRN

## 2016-10-17 MED ORDER — TAMSULOSIN HCL 0.4 MG PO CAPS: 0.4000 mg | ORAL_CAPSULE | Freq: Every day | ORAL | 3 refills | Status: DC

## 2016-10-17 MED ORDER — PRAVASTATIN SODIUM 20 MG PO TABS: 20.0000 mg | ORAL_TABLET | Freq: Every day | ORAL | 3 refills | Status: DC

## 2016-10-17 MED ORDER — METFORMIN HCL 1000 MG PO TABS: 1000.0000 mg | ORAL_TABLET | Freq: Two times a day (BID) | ORAL | 3 refills | Status: DC

## 2016-10-17 MED ORDER — LISINOPRIL-HYDROCHLOROTHIAZIDE 10-12.5 MG PO TABS: 1.0000 | ORAL_TABLET | Freq: Every day | ORAL | 3 refills | Status: DC

## 2016-10-17 MED FILL — PRAVASTATIN NA 20 MG TAB: 20 | 30 days supply | Qty: 30 | Fill #0

## 2016-10-17 MED FILL — !VIAGRA 50 MG TABLET: 50 | 30 days supply | Qty: 3 | Fill #0

## 2016-10-17 MED FILL — LISINOPRIL-HCTZ 10-12.5 MG: 10-12.5 | 30 days supply | Qty: 30 | Fill #0

## 2016-10-17 MED FILL — ?TAMSULOSIN HCL 0.4 MG CAP: 0.4 | 30 days supply | Qty: 30 | Fill #0

## 2016-10-17 MED FILL — ?METFORMIN HCL 1,000 MG TAB: 1000 | 30 days supply | Qty: 60 | Fill #0

## 2016-10-17 NOTE — Progress Notes (Signed)
Jeremy [name], is a 56 y.o. male  BTD:[id number]  XMI:[id number]  DOB - [birth year]  CC:  Chief Complaint  Patient presents with  . Diabetes  . Hypertension  . Abdominal Pain       HPI: Jeremy [name] is a 56 y.o. male here today to establish medical care, last seen in clinic 06/02/16, w/ sig PMHx of htn, dm w/ neuropathy, morbid obesity, hx of hep A as child.  Pt states he is taking all his meds, would like to go back to metformin b/c glipizide causes his sugars to drop at night, sometimes as low as 40s.  He does not watch his diet much, but says he does not add extra salt.  Ran out of his bp meds about 2 days ago. Denies tob and etoh abuse.  Co of urinary hesitancy, inability to complete urine stream at times.  Also c/o of ED, he does admit he does not have sex drive as much as prior.  Able to get erection, but cannot sustain. Difficult focusing at times during intercourse as well.  Fiance had hysterectomy, so has decrease drive as well.  Last 1-2 days, had abd cramps, n w/ diarrhea. Ate out at [place] 2 days ago, and fiancee also sick w/ similar gi bug. Of note, there are other ppl in his household also sick w/ similar complaints, who did not eat at [place].  Able to keep food/fluids down at this time.    Patient has No headache, No chest pain,  No new weakness tingling or numbness, No Cough - SOB.    Review of Systems: Per hpi, o/w all systems reviewed and negative.    No Known Allergies Past Medical History:  Diagnosis Date  . Diabetes mellitus without complication ([place])   . Hypertension   . Rash of entire body 03/2016   Current Outpatient Prescriptions on File Prior to Visit  Medication Sig Dispense Refill  . acetaminophen (TYLENOL) 500 MG tablet Take 500 mg by mouth 2 (two) times daily.    . Blood Glucose Monitoring Suppl (TRUE METRIX METER) w/Device KIT 1 each by Does not apply route 4 (four) times daily -  before meals and at bedtime. 1 kit 0  . glucose  blood (TRUE METRIX BLOOD GLUCOSE TEST) test strip Use as instructed 100 each 12  . glucose blood test strip Use as instructed 100 each 12  . glucose monitoring kit (FREESTYLE) monitoring kit 1 each by Does not apply route as needed for other. Test glucose twice daily 1 each 0  . loratadine (CLARITIN) 10 MG tablet Take 10 mg by mouth daily.    . naproxen sodium (ANAPROX) 220 MG tablet Take 440 mg by mouth 2 (two) times daily with a meal.    . OVER THE COUNTER MEDICATION Take 2 tablets by mouth at bedtime. "RESTLESS LEGS OTC"    . POTASSIUM GLUCONATE PO Take 1 tablet by mouth 2 (two) times daily.    . ranitidine (ZANTAC) 150 MG tablet Take 150 mg by mouth at bedtime.     . TRUEPLUS LANCETS 28G MISC 1 each by Does not apply route 4 (four) times daily -  before meals and at bedtime. 100 each 12  . diphenhydrAMINE (BENADRYL) 25 MG tablet Take 25 mg by mouth every 6 (six) hours as needed for itching or allergies. Reported on 04/12/2016     No current facility-administered medications on file prior to visit.    Family History  Problem Relation Age of  Onset  . Pancreatic cancer Mother   . CAD Father    Social History   Social History  . Marital status: Married    Spouse name: N/A  . Number of children: N/A  . Years of education: N/A   Occupational History  . general contractor    Social History Main Topics  . Smoking status: Former Smoker    Quit date: 10/16/1988  . Smokeless tobacco: Never Used  . Alcohol use No  . Drug use: No  . Sexual activity: Not on file   Other Topics Concern  . Not on file   Social History Narrative   Volunteers as firefighter    Objective:   Vitals:   10/17/16 0941  BP: (!) 170/80  Pulse: 72  Resp: 16  Temp: 98.5 F (36.9 C)    Filed Weights   10/17/16 0941  Weight: 239 lb (108.4 kg)    BP Readings from Last 3 Encounters:  10/17/16 (!) 170/80  06/02/16 127/71  04/12/16 (!) 146/81    Physical Exam: Constitutional: Patient appears  well-developed and well-nourished. No distress. AAOx3, pleasant. No acute distress.  HENT: Normocephalic, atraumatic, External right and left ear normal. Oropharynx is clear and moist.  bilat TMs clear. Eyes: Conjunctivae and EOM are normal. PERRL, no scleral icterus. Neck: Normal ROM. Neck supple. No JVD.  No goiter palpated. CVS: RRR, S1/S2 +, no murmurs, no gallops, no carotid bruit.  Pulmonary: Effort and breath sounds normal, no stridor, rhonchi, wheezes, rales.  Abdominal: Soft. BS +, obese, NTTP, no g/r. Musculoskeletal: Normal range of motion. No edema and no tenderness.  LE: bilat/ no c/c/e, pulses 2+ bilateral. Foot exam: bilateral peripheral pulses 2+ (dorsalis pedis and post tibialis pulses), no ulcers noted/no ecchymosis, warm to touch, monofilament testing 1/3 bilat. Sensation intact but decreased bilat.  No c/c/e. Neuro: Alert.  muscle tone coordination wnl. No cranial nerve deficit grossly. Skin: Skin is warm and dry. No rash noted. Not diaphoretic. No erythema. No pallor. Psychiatric: Normal mood and affect. Behavior, judgment, thought content normal.  Lab Results  Component Value Date   WBC 6.5 04/07/2016   HGB 16.1 04/07/2016   HCT 46.5 04/07/2016   MCV 90.1 04/07/2016   PLT 135 (L) 04/07/2016   Lab Results  Component Value Date   CREATININE 0.95 04/07/2016   BUN 14 04/07/2016   NA 135 04/07/2016   K 4.0 04/07/2016   CL 97 (L) 04/07/2016   CO2 28 04/07/2016    Lab Results  Component Value Date   HGBA1C 7.4 10/17/2016   Lipid Panel  No results found for: CHOL, TRIG, HDL, CHOLHDL, VLDL, LDLCALC      Depression screen [facility] 2/9 10/17/2016 06/02/2016 04/12/2016 04/12/2016  Decreased Interest 0 0 0 0  Down, Depressed, Hopeless 0 0 0 0  PHQ - 2 Score 0 0 0 0  Altered sleeping 0 - 0 -  Tired, decreased energy 0 - 2 -  Change in appetite 0 - 2 -  Feeling bad or failure about yourself  0 - 1 -  Trouble concentrating 0 - 0 -  Moving slowly or fidgety/restless 0 - 0  -  Suicidal thoughts 0 - 0 -  PHQ-9 Score 0 - 5 -    Assessment and plan:   1. Essential hypertension Uncontrolled. C/o of rebound ha w/ clonidine at times. - dc clonidine. - prinzide 10-12.5 qd - CBC with Differential - Basic Metabolic Panel - bp check w/ RN in 2 wks., if  sbp >130, change prinzide to 20-25 qd  2. 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  3. Benign prostatic hyperplasia with lower urinary tract symptoms, symptom details unspecified - trial flomax 0.4 mg qd - PSA  4. Erectile dysfunction, unspecified erectile dysfunction type Recd spending quality time w/ fiance, stress can play huge part in performance. Better bp control and dm control will also help as well. - Testosterone Total,Free,Bio, Males - trial viagra if can get at pharmacy  5. Encounter for screening for HIV - HIV antibody (with reflex)  6. Thyroid disorder screen - TSH  7. Pt declined pneumococcal vac/flu vac/tdap today.  8. Recd financial aid packet.  Return in about 3 months (around 01/15/2017), or if symptoms worsen or fail to improve.  The patient was given clear instructions to go to ER or return to medical center if symptoms don't improve, worsen or new problems develop. The patient verbalized understanding. The patient was told to call to get lab results if they haven't heard anything in the next week.    This note has been created with [employer], [employer] Any transcriptional errors are unintentional.   DMaren Reamer MD, [place]  10/17/2016, 11:19 AM

## 2016-10-17 NOTE — Patient Instructions (Addendum)
- blood pressure check w/ RN - 2 wks / Jeremy [name]  -  Low-Sodium Eating Plan Sodium raises blood pressure and causes water to be held in the body. Getting less sodium from food will help lower your blood pressure, reduce any swelling, and protect your heart, liver, and kidneys. We get sodium by adding salt (sodium chloride) to food. Most of our sodium comes from canned, boxed, and frozen foods. Restaurant foods, fast foods, and pizza are also very high in sodium. Even if you take medicine to lower your blood pressure or to reduce fluid in your body, getting less sodium from your food is important. What is my plan? Most people should limit their sodium intake to 2,300 mg a day. Your health care provider recommends that you limit your sodium intake to 2,000mg  a day. What do I need to know about this eating plan? For the low-sodium eating plan, you will follow these general guidelines:  Choose foods with a % Daily Value for sodium of less than 5% (as listed on the food label).  Use salt-free seasonings or herbs instead of table salt or sea salt.  Check with your health care provider or pharmacist before using salt substitutes.  Eat fresh foods.  Eat more vegetables and fruits.  Limit canned vegetables. If you do use them, rinse them well to decrease the sodium.  Limit cheese to 1 oz (28 g) per day.  Eat lower-sodium products, often labeled as "lower sodium" or "no salt added."  Avoid foods that contain monosodium glutamate (MSG). MSG is sometimes added to [place] food and some canned foods.  Check food labels (Nutrition Facts labels) on foods to learn how much sodium is in one serving.  Eat more home-cooked food and less restaurant, buffet, and fast food.  When eating at a restaurant, ask that your food be prepared with less salt, or no salt if possible. How do I read food labels for sodium information? The Nutrition Facts label lists the amount of sodium in one serving of the food. If  you eat more than one serving, you must multiply the listed amount of sodium by the number of servings. Food labels may also identify foods as:  Sodium free-Less than 5 mg in a serving.  Very low sodium-35 mg or less in a serving.  Low sodium-140 mg or less in a serving.  Light in sodium-50% less sodium in a serving. For example, if a food that usually has 300 mg of sodium is changed to become light in sodium, it will have 150 mg of sodium.  Reduced sodium-25% less sodium in a serving. For example, if a food that usually has 400 mg of sodium is changed to reduced sodium, it will have 300 mg of sodium. What foods can I eat? Grains  Low-sodium cereals, including oats, puffed wheat and rice, and shredded wheat cereals. Low-sodium crackers. Unsalted rice and pasta. Lower-sodium bread. Vegetables  Frozen or fresh vegetables. Low-sodium or reduced-sodium canned vegetables. Low-sodium or reduced-sodium tomato sauce and paste. Low-sodium or reduced-sodium tomato and vegetable juices. Fruits  Fresh, frozen, and canned fruit. Fruit juice. Meat and Other Protein Products  Low-sodium canned tuna and salmon. Fresh or frozen meat, poultry, seafood, and fish. Lamb. Unsalted nuts. Dried beans, peas, and lentils without added salt. Unsalted canned beans. Homemade soups without salt. Eggs. Dairy  Milk. Soy milk. Ricotta cheese. Low-sodium or reduced-sodium cheeses. Yogurt. Condiments  Fresh and dried herbs and spices. Salt-free seasonings. Onion and garlic powders. Low-sodium varieties of  mustard and ketchup. Fresh or refrigerated horseradish. Lemon juice. Fats and Oils  Reduced-sodium salad dressings. Unsalted butter. Other  Unsalted popcorn and pretzels. The items listed above may not be a complete list of recommended foods or beverages. Contact your dietitian for more options.  What foods are not recommended? Grains  Instant hot cereals. Bread stuffing, pancake, and biscuit mixes. Croutons. Seasoned  rice or pasta mixes. Noodle soup cups. Boxed or frozen macaroni and cheese. Self-rising flour. Regular salted crackers. Vegetables  Regular canned vegetables. Regular canned tomato sauce and paste. Regular tomato and vegetable juices. Frozen vegetables in sauces. Salted [place] fries. Olives. Rosita FirePickles. Relishes. Sauerkraut. Salsa. Meat and Other Protein Products  Salted, canned, smoked, spiced, or pickled meats, seafood, or fish. Bacon, ham, sausage, hot dogs, corned beef, chipped beef, and packaged luncheon meats. Salt pork. Jerky. Pickled herring. Anchovies, regular canned tuna, and sardines. Salted nuts. Dairy  Processed cheese and cheese spreads. Cheese curds. Blue cheese and cottage cheese. Buttermilk. Condiments  Onion and garlic salt, seasoned salt, table salt, and sea salt. Canned and packaged gravies. Worcestershire sauce. Tartar sauce. Barbecue sauce. Teriyaki sauce. Soy sauce, including reduced sodium. Steak sauce. Fish sauce. Oyster sauce. Cocktail sauce. Horseradish that you find on the shelf. Regular ketchup and mustard. Meat flavorings and tenderizers. Bouillon cubes. Hot sauce. Tabasco sauce. Marinades. Taco seasonings. Relishes. Fats and Oils  Regular salad dressings. Salted butter. Margarine. Ghee. Bacon fat. Other  Potato and tortilla chips. Corn chips and puffs. Salted popcorn and pretzels. Canned or dried soups. Pizza. Frozen entrees and pot pies. The items listed above may not be a complete list of foods and beverages to avoid. Contact your dietitian for more information.  This information is not intended to replace advice given to you by your health care provider. Make sure you discuss any questions you have with your health care provider. Document Released: 03/24/2002 Document Revised: 03/09/2016 Document Reviewed: 08/06/2013 Elsevier Interactive Patient Education  2017 Elsevier Inc.   -  Erectile Dysfunction Erectile dysfunction is the inability to get or sustain a good  enough erection to have sexual intercourse. Erectile dysfunction may involve:  Inability to get an erection.  Lack of enough hardness to allow penetration.  Loss of the erection before sex is finished.  Premature ejaculation. CAUSES  Certain drugs, such as:  Pain relievers.  Antihistamines.  Antidepressants.  Blood pressure medicines.  Water pills (diuretics).  Ulcer medicines.  Muscle relaxants.  Illegal drugs.  Excessive drinking.  Psychological causes, such as:  Anxiety.  Depression.  Sadness.  Exhaustion.  Performance fear.  Stress.  Physical causes, such as:  Artery problems. This may include diabetes, smoking, liver disease, or atherosclerosis.  High blood pressure.  Hormonal problems, such as low testosterone.  Obesity.  Nerve problems. This may include back or pelvic injuries, diabetes mellitus, multiple sclerosis, or Parkinson disease. SYMPTOMS  Inability to get an erection.  Lack of enough hardness to allow penetration.  Loss of the erection before sex is finished.  Premature ejaculation.  Normal erections at some times, but with frequent unsatisfactory episodes.  Orgasms that are not satisfactory in sensation or frequency.  Low sexual satisfaction in either partner because of erection problems.  A curved penis occurring with erection. The curve may cause pain or may be too curved to allow for intercourse.  Never having nighttime erections. DIAGNOSIS Your caregiver can often diagnose this condition by:  Performing a physical exam to find other diseases or specific problems with the penis.  Asking  you detailed questions about the problem.  Performing blood tests to check for diabetes mellitus or to measure hormone levels.  Performing urine tests to find other underlying health conditions.  Performing an ultrasound exam to check for scarring.  Performing a test to check blood flow to the penis.  Doing a sleep study at  home to measure nighttime erections. TREATMENT   You may be prescribed medicines by mouth.  You may be given medicine injections into the penis.  You may be prescribed a vacuum pump with a ring.  Penile implant surgery may be performed. You may receive:  An inflatable implant.  A semirigid implant.  Blood vessel surgery may be performed. HOME CARE INSTRUCTIONS  If you are prescribed oral medicine, you should take the medicine as prescribed. Do not increase the dosage without first discussing it with your physician.  If you are using self-injections, be careful to avoid any veins that are on the surface of the penis. Apply pressure to the injection site for 5 minutes.  If you are using a vacuum pump, make sure you have read the instructions before using it. Discuss any questions with your physician before taking the pump home. SEEK MEDICAL CARE IF:  You experience pain that is not responsive to the pain medicine you have been prescribed.  You experience nausea or vomiting. SEEK IMMEDIATE MEDICAL CARE IF:   When taking oral or injectable medications, you experience an erection that lasts longer than 4 hours. If your physician is unavailable, go to the nearest emergency room for evaluation. An erection that lasts much longer than 4 hours can result in permanent damage to your penis.  You have pain that is severe.  You develop redness, severe pain, or severe swelling of your penis.  You have redness spreading up into your groin or lower abdomen.  You are unable to pass your urine. This information is not intended to replace advice given to you by your health care provider. Make sure you discuss any questions you have with your health care provider. Document Released: 09/29/2000 Document Revised: 06/04/2013 Document Reviewed: 03/06/2013 Elsevier Interactive Patient Education  2017 Elsevier Inc.  -  Diabetes Mellitus and Food It is important for you to manage your blood sugar  (glucose) level. Your blood glucose level can be greatly affected by what you eat. Eating healthier foods in the appropriate amounts throughout the day at about the same time each day will help you control your blood glucose level. It can also help slow or prevent worsening of your diabetes mellitus. Healthy eating may even help you improve the level of your blood pressure and reach or maintain a healthy weight. General recommendations for healthful eating and cooking habits include:  Eating meals and snacks regularly. Avoid going long periods of time without eating to lose weight.  Eating a diet that consists mainly of plant-based foods, such as fruits, vegetables, nuts, legumes, and whole grains.  Using low-heat cooking methods, such as baking, instead of high-heat cooking methods, such as deep frying. Work with your dietitian to make sure you understand how to use the Nutrition Facts information on food labels. How can food affect me? Carbohydrates  Carbohydrates affect your blood glucose level more than any other type of food. Your dietitian will help you determine how many carbohydrates to eat at each meal and teach you how to count carbohydrates. Counting carbohydrates is important to keep your blood glucose at a healthy level, especially if you are using insulin or  taking certain medicines for diabetes mellitus. Alcohol  Alcohol can cause sudden decreases in blood glucose (hypoglycemia), especially if you use insulin or take certain medicines for diabetes mellitus. Hypoglycemia can be a life-threatening condition. Symptoms of hypoglycemia (sleepiness, dizziness, and disorientation) are similar to symptoms of having too much alcohol. If your health care provider has given you approval to drink alcohol, do so in moderation and use the following guidelines:  Women should not have more than one drink per day, and men should not have more than two drinks per day. One drink is equal to:  12 oz of  beer.  5 oz of wine.  1 oz of hard liquor.  Do not drink on an empty stomach.  Keep yourself hydrated. Have water, diet soda, or unsweetened iced tea.  Regular soda, juice, and other mixers might contain a lot of carbohydrates and should be counted. What foods are not recommended? As you make food choices, it is important to remember that all foods are not the same. Some foods have fewer nutrients per serving than other foods, even though they might have the same number of calories or carbohydrates. It is difficult to get your body what it needs when you eat foods with fewer nutrients. Examples of foods that you should avoid that are high in calories and carbohydrates but low in nutrients include:  Trans fats (most processed foods list trans fats on the Nutrition Facts label).  Regular soda.  Juice.  Candy.  Sweets, such as cake, pie, doughnuts, and cookies.  Fried foods. What foods can I eat? Eat nutrient-rich foods, which will nourish your body and keep you healthy. The food you should eat also will depend on several factors, including:  The calories you need.  The medicines you take.  Your weight.  Your blood glucose level.  Your blood pressure level.  Your cholesterol level. You should eat a variety of foods, including:  Protein.  Lean cuts of meat.  Proteins low in saturated fats, such as fish, egg whites, and beans. Avoid processed meats.  Fruits and vegetables.  Fruits and vegetables that may help control blood glucose levels, such as apples, mangoes, and yams.  Dairy products.  Choose fat-free or low-fat dairy products, such as milk, yogurt, and cheese.  Grains, bread, pasta, and rice.  Choose whole grain products, such as multigrain bread, whole oats, and brown rice. These foods may help control blood pressure.  Fats.  Foods containing healthful fats, such as nuts, avocado, olive oil, canola oil, and fish. Does everyone with diabetes mellitus  have the same meal plan? Because every person with diabetes mellitus is different, there is not one meal plan that works for everyone. It is very important that you meet with a dietitian who will help you create a meal plan that is just right for you. This information is not intended to replace advice given to you by your health care provider. Make sure you discuss any questions you have with your health care provider. Document Released: 06/29/2005 Document Revised: 03/09/2016 Document Reviewed: 08/29/2013 Elsevier Interactive Patient Education  2017 [employer]. --   Tips for Eating Away From Home If You Have Diabetes Controlling your level of blood glucose, also known as blood sugar, can be challenging. It can be even more difficult when you do not prepare your own meals. The following tips can help you manage your diabetes when you eat away from home. Planning ahead Plan ahead if you know you will be eating  away from home:  Ask your health care provider how to time meals and medicine if you are taking insulin.  Make a list of restaurants near you that offer healthy choices. If they have a carry-out menu, take it home and plan what you will order ahead of time.  Look up the restaurant you want to eat at online. Many chain and fast-food restaurants list nutritional information online. Use this information to choose the healthiest options and to calculate how many carbohydrates will be in your meal.  Use a carbohydrate-counting book or mobile app to look up the carbohydrate content and serving size of the foods you want to eat.  Become familiar with serving sizes and learn to recognize how many servings are in a portion. This will allow you to estimate how many carbohydrates you can eat. Free foods A "free food" is any food or drink that has less than 5 g of carbohydrates per serving. Free foods include:  Many vegetables.  Hard boiled eggs.  Nuts or  seeds.  Olives.  Cheeses.  Meats. These types of foods make good appetizer choices and are often available at salad bars. Lemon juice, vinegar, or a low-calorie salad dressing of fewer than 20 calories per serving can be used as a "free" salad dressing. Choices to reduce carbohydrates  Substitute nonfat sweetened yogurt with a sugar-free yogurt. Yogurt made from soy milk may also be used, but you will still want a sugar-free or plain option to choose a lower carbohydrate amount.  Ask your server to take away the bread basket or chips from your table.  Order fresh fruit. A salad bar often offers fresh fruit choices. Avoid canned fruit because it is usually packed in sugar or syrup.  Order a salad, and eat it without dressing. Or, create a "free" salad dressing.  Ask for substitutions. For example, instead of [place] fries, request an order of a vegetable such as salad, green beans, or broccoli. Other tips  If you take insulin, take the insulin once your food arrives to your table. This will ensure your insulin and food are timed correctly.  Ask your server about the portion size before your order, and ask for a take-out box if the portion has more servings than you should have. When your food comes, leave the amount you should have on the plate, and put the rest in the take-out box.  Consider splitting an entree with someone and ordering a side salad. This information is not intended to replace advice given to you by your health care provider. Make sure you discuss any questions you have with your health care provider. Document Released: 10/02/2005 Document Revised: 03/09/2016 Document Reviewed: 12/30/2013 Elsevier Interactive Patient Education  2017 Elsevier Inc.   -  Diabetes Mellitus and Exercise Exercising regularly is important for your overall health, especially when you have diabetes (diabetes mellitus). Exercising is not only about losing weight. It has many health benefits,  such as increasing muscle strength and bone density and reducing body fat and stress. This leads to improved fitness, flexibility, and endurance, all of which result in better overall health. Exercise has additional benefits for people with diabetes, including:  Reducing appetite.  Helping to lower and control blood glucose.  Lowering blood pressure.  Helping to control amounts of fatty substances (lipids) in the blood, such as cholesterol and triglycerides.  Helping the body to respond better to insulin (improving insulin sensitivity).  Reducing how much insulin the body needs.  Decreasing the risk  for heart disease by:  Lowering cholesterol and triglyceride levels.  Increasing the levels of good cholesterol.  Lowering blood glucose levels. What is my activity plan? Your health care provider or certified diabetes educator can help you make a plan for the type and frequency of exercise (activity plan) that works for you. Make sure that you:  Do at least 150 minutes of moderate-intensity or vigorous-intensity exercise each week. This could be brisk walking, biking, or water aerobics.  Do stretching and strength exercises, such as yoga or weightlifting, at least 2 times a week.  Spread out your activity over at least 3 days of the week.  Get some form of physical activity every day.  Do not go more than 2 days in a row without some kind of physical activity.  Avoid being inactive for more than 90 minutes at a time. Take frequent breaks to walk or stretch.  Choose a type of exercise or activity that you enjoy, and set realistic goals.  Start slowly, and gradually increase the intensity of your exercise over time. What do I need to know about managing my diabetes?  Check your blood glucose before and after exercising.  If your blood glucose is higher than 240 mg/dL (16.1 mmol/L) before you exercise, check your urine for ketones. If you have ketones in your urine, do not  exercise until your blood glucose returns to normal.  Know the symptoms of low blood glucose (hypoglycemia) and how to treat it. Your risk for hypoglycemia increases during and after exercise. Common symptoms of hypoglycemia can include:  Hunger.  Anxiety.  Sweating and feeling clammy.  Confusion.  Dizziness or feeling light-headed.  Increased heart rate or palpitations.  Blurry vision.  Tingling or numbness around the mouth, lips, or tongue.  Tremors or shakes.  Irritability.  Keep a rapid-acting carbohydrate snack available before, during, and after exercise to help prevent or treat hypoglycemia.  Avoid injecting insulin into areas of the body that are going to be exercised. For example, avoid injecting insulin into:  The arms, when playing tennis.  The legs, when jogging.  Keep records of your exercise habits. Doing this can help you and your health care provider adjust your diabetes management plan as needed. Write down:  Food that you eat before and after you exercise.  Blood glucose levels before and after you exercise.  The type and amount of exercise you have done.  When your insulin is expected to peak, if you use insulin. Avoid exercising at times when your insulin is peaking.  When you start a new exercise or activity, work with your health care provider to make sure the activity is safe for you, and to adjust your insulin, medicines, or food intake as needed.  Drink plenty of water while you exercise to prevent dehydration or heat stroke. Drink enough fluid to keep your urine clear or pale yellow. This information is not intended to replace advice given to you by your health care provider. Make sure you discuss any questions you have with your health care provider. Document Released: 12/23/2003 Document Revised: 04/21/2016 Document Reviewed: 03/13/2016 Elsevier Interactive Patient Education  2017 [employer].

## 2016-10-18 ENCOUNTER — Other Ambulatory Visit: Payer: Self-pay | Admitting: Internal Medicine

## 2016-10-18 LAB — TESTOSTERONE TOTAL,FREE,BIO, MALES
Albumin: 4.3 g/dL (ref 3.6–5.1)
SEX HORMONE BINDING: 55 nmol/L — AB (ref 10–50)
TESTOSTERONE: 635 ng/dL (ref 250–827)
Testosterone, Bioavailable: 112.1 ng/dL (ref 110.0–575.0)
Testosterone, Free: 56.9 pg/mL (ref 46.0–224.0)

## 2016-10-18 LAB — BASIC METABOLIC PANEL
BUN: 19 mg/dL (ref 7–25)
CHLORIDE: 103 mmol/L (ref 98–110)
CO2: 22 mmol/L (ref 20–31)
Calcium: 9.4 mg/dL (ref 8.6–10.3)
Creat: 0.72 mg/dL (ref 0.70–1.33)
Glucose, Bld: 178 mg/dL — ABNORMAL HIGH (ref 65–99)
POTASSIUM: 4 mmol/L (ref 3.5–5.3)
SODIUM: 137 mmol/L (ref 135–146)

## 2016-10-18 LAB — LIPID PANEL
CHOLESTEROL: 190 mg/dL (ref <200)
HDL: 40 mg/dL — AB (ref >40)
TRIGLYCERIDES: 523 mg/dL — AB (ref <150)
Total CHOL/HDL Ratio: 4.8 Ratio (ref <5.0)

## 2016-10-18 LAB — PSA: PSA: 0.3 ng/mL (ref <=4.0)

## 2016-10-18 LAB — HIV ANTIBODY (ROUTINE TESTING W REFLEX): HIV: NONREACTIVE

## 2016-10-18 MED ORDER — ATORVASTATIN CALCIUM 40 MG PO TABS: 40.0000 mg | ORAL_TABLET | Freq: Every day | ORAL | 3 refills | Status: DC

## 2016-10-20 MED FILL — ATORVASTATIN 40 MG TABLET: 40 | 30 days supply | Qty: 30 | Fill #0

## 2016-10-23 ENCOUNTER — Other Ambulatory Visit: Payer: Self-pay | Admitting: *Deleted

## 2016-10-23 MED ORDER — SILDENAFIL CITRATE 50 MG PO TABS: 50.0000 mg | ORAL_TABLET | Freq: Every day | ORAL | 3 refills | Status: DC | PRN

## 2016-10-23 NOTE — Telephone Encounter (Signed)
PRINTED FOR PASS PROGRAM 

## 2016-10-24 ENCOUNTER — Telehealth: Payer: Self-pay | Admitting: Internal Medicine

## 2016-10-24 DIAGNOSIS — R682 Dry mouth, unspecified: Secondary | ICD-10-CM

## 2016-10-24 NOTE — Telephone Encounter (Signed)
States he would like to change medication "Can feel HR in teeth"  States he has had a HA for days States that his allergy medication does not agree with the BP medications. He c/o dehydtraiton, dry mouth and cough. Pt informed that dry cough was a common side effect of medication.Congestion for 1 week, feels like he has the flu  Yesterday he checked BP at home. Reading was 164/96. He states he did not take medication yesterday or today.  Denies chest pain, SOB, or swelling in BLE. pls advise

## 2016-10-24 NOTE — Telephone Encounter (Signed)
Patient would like to change his bp med..he is currently taking lisinopril but states he is experiencing dehydration and elevated heart rate.  Please advise

## 2016-10-24 NOTE — Telephone Encounter (Signed)
Can you add him to your clinic or my clinic tomorw am. thanks

## 2016-10-24 NOTE — Telephone Encounter (Signed)
Called patient to add him to nurse schedule, no answer and unable to leave message on VM.

## 2016-10-25 MED ORDER — HYDROXYZINE HCL 10 MG PO TABS: 10.0000 mg | ORAL_TABLET | Freq: Three times a day (TID) | ORAL | 0 refills | Status: DC | PRN

## 2016-10-25 NOTE — Telephone Encounter (Signed)
Pt would like to be back on the medication he was on before he was put on the Lisinopril.

## 2016-10-25 NOTE — Telephone Encounter (Signed)
Called pt, confirmed dob.  He stopped his prinzide Monday, having agitation/hear racing, dry mouth,   Suspect all his sx are from clonidine w/d. dw with him at length about clonidine and risk of dependence/withdrawal problems/rebound ha/ and just not a good med for him  Recd rn /Travia bp chk today. chk bmp - ordered - recd resume his prinzide 10/12.5 qd - rx hydroxyzine rx prn for w/d symptoms on clonidine  - antihistamine, ordered - would NOT recd resuming clonidine. - fu w/ me or bp chk in 2 wks. Thereafter.

## 2016-10-26 ENCOUNTER — Ambulatory Visit: Payer: Self-pay | Attending: Internal Medicine | Admitting: *Deleted

## 2016-10-26 ENCOUNTER — Other Ambulatory Visit: Payer: Self-pay | Admitting: Pharmacist

## 2016-10-26 VITALS — BP 148/64 | HR 81 | Resp 20

## 2016-10-26 DIAGNOSIS — I10 Essential (primary) hypertension: Secondary | ICD-10-CM | POA: Insufficient documentation

## 2016-10-26 DIAGNOSIS — Z79899 Other long term (current) drug therapy: Secondary | ICD-10-CM | POA: Insufficient documentation

## 2016-10-26 MED ORDER — LORATADINE 10 MG PO TABS: 10.0000 mg | ORAL_TABLET | Freq: Every day | ORAL | 0 refills | Status: DC

## 2016-10-26 MED ORDER — ATORVASTATIN CALCIUM 40 MG PO TABS: 40.0000 mg | ORAL_TABLET | Freq: Every day | ORAL | 0 refills | Status: DC

## 2016-10-26 MED ORDER — TAMSULOSIN HCL 0.4 MG PO CAPS: 0.4000 mg | ORAL_CAPSULE | Freq: Every day | ORAL | 3 refills | Status: DC

## 2016-10-26 MED ORDER — RANITIDINE HCL 150 MG PO TABS: 150.0000 mg | ORAL_TABLET | Freq: Every day | ORAL | 0 refills | Status: DC

## 2016-10-26 MED ORDER — HYDROXYZINE HCL 10 MG PO TABS: 10.0000 mg | ORAL_TABLET | Freq: Three times a day (TID) | ORAL | 0 refills | Status: DC | PRN

## 2016-10-26 MED ORDER — METFORMIN HCL 1000 MG PO TABS: 1000.0000 mg | ORAL_TABLET | Freq: Two times a day (BID) | ORAL | 0 refills | Status: DC

## 2016-10-26 MED ORDER — LISINOPRIL-HYDROCHLOROTHIAZIDE 10-12.5 MG PO TABS: 1.0000 | ORAL_TABLET | Freq: Every day | ORAL | 0 refills | Status: DC

## 2016-10-26 MED ORDER — SILDENAFIL CITRATE 50 MG PO TABS: 50.0000 mg | ORAL_TABLET | Freq: Every day | ORAL | 0 refills | Status: DC | PRN

## 2016-10-26 MED FILL — raNITIdine HCL 150 MG TABS: 150 | 30 days supply | Qty: 30 | Fill #0

## 2016-10-26 MED FILL — hydrOXYzine HCL 10 MG TABS: 10 | 10 days supply | Qty: 30 | Fill #0

## 2016-10-26 NOTE — Progress Notes (Signed)
Pt is not excited about changing medication back to Lisinopril and also states he would rather not take HCTZ either. Clarified that physician wanted him to take medications for Clonidine withdrawal Atarax not Adderall. Pt verbalized understanding. Pharmacist assistance, Juanita CraverStacey Karl and Amy used to answer questions about medications for blood pressure. Alternative drug classification made for ARB category. Pt encouraged to go to emergency department if s/s of stroke or heart attack presented. Guy Francoravia Weltha Cathy, RN

## 2016-10-31 ENCOUNTER — Ambulatory Visit: Payer: Self-pay | Attending: Internal Medicine | Admitting: *Deleted

## 2016-10-31 VITALS — BP 130/70 | HR 83 | Resp 20

## 2016-10-31 DIAGNOSIS — I10 Essential (primary) hypertension: Secondary | ICD-10-CM

## 2016-10-31 NOTE — Progress Notes (Signed)
Pt here for BP Check 132/70 - Right arm, manully  130/70- Left arm, manually He denies chest pain, dizziness, BLE edema,  SOB, blurred vision, noticed on yesterday when his blood sugar was elevated.   He is taking Prinzide but he stated before that he would like to discontinue this medication and try different medication for BP due to the side effects.

## 2016-11-17 ENCOUNTER — Ambulatory Visit: Payer: Self-pay | Attending: Internal Medicine

## 2016-11-21 MED FILL — LISINOPRIL-HCTZ 10-12.5 MG: 10-12.5 | 30 days supply | Qty: 30 | Fill #1

## 2016-11-21 MED FILL — ?TAMSULOSIN HCL 0.4 MG CAP: 0.4 | 30 days supply | Qty: 30 | Fill #1

## 2016-11-21 MED FILL — ?METFORMIN HCL 1,000 MG TAB: 1000 | 30 days supply | Qty: 60 | Fill #1

## 2016-11-23 MED FILL — $Viagra 50mg tablet: 50 | 30 days supply | Qty: 30 | Fill #0

## 2016-11-29 MED FILL — ATORVASTATIN 40 MG TABLET: 40 | 30 days supply | Qty: 30 | Fill #1

## 2016-12-07 ENCOUNTER — Telehealth: Payer: Self-pay | Admitting: Internal Medicine

## 2016-12-07 NOTE — Telephone Encounter (Signed)
Pt calling stating he is experiencing lightheadeness, constant stomach pains, jerking of muscles and he states one minute he is sweating and then the next he is freezing cold  Requesting to speak to triage nurse

## 2016-12-07 NOTE — Telephone Encounter (Addendum)
Pt calls to report severe stomach pain almost all the time,  feels shaky inside and when he hold his hands out. He stopped taking his medications for 1 day to see which medication was making him feel bad. He reports dizziness x 2 weeks. He states when he began to take his medication again, he felt pain after taking Ranitidine. Last night he took several TUMS to alleviate the pain. He denies blood in stool or emesis. Two days ago his blood sugar was 260. He checked his blood pressure while on the phone with the writer and reading was at 160/95.  Informed patient next available apt was in 2 weeks. Pt  Does not feel like he can work like this. Recommended to proceed to UC for further eval. Guy Francoravia Beda Dula, RN

## 2016-12-09 ENCOUNTER — Encounter (HOSPITAL_COMMUNITY): Payer: Self-pay | Admitting: Emergency Medicine

## 2016-12-09 ENCOUNTER — Ambulatory Visit (HOSPITAL_COMMUNITY)
Admission: EM | Admit: 2016-12-09 | Discharge: 2016-12-09 | Disposition: A | Discharge status: Home / Self Care | Payer: Self-pay | Attending: Family Medicine | Admitting: Family Medicine

## 2016-12-09 DIAGNOSIS — K2900 Acute gastritis without bleeding: Secondary | ICD-10-CM

## 2016-12-09 DIAGNOSIS — B354 Tinea corporis: Secondary | ICD-10-CM

## 2016-12-09 MED ORDER — GI COCKTAIL ~~LOC~~
30.0000 mL | Freq: Once | ORAL | Status: AC
  Administered 2016-12-09: 30 mL via ORAL

## 2016-12-09 MED ORDER — OMEPRAZOLE 20 MG PO CPDR: 20.0000 mg | DELAYED_RELEASE_CAPSULE | Freq: Every day | ORAL | 1 refills | Status: DC

## 2016-12-09 MED ORDER — GI COCKTAIL ~~LOC~~
ORAL | Status: AC
  Filled 2016-12-09: qty 30

## 2016-12-09 NOTE — Discharge Instructions (Signed)
Consider Lotrimin cream twice a day for the rash if the spray does not work  If the stomach pain is not improving in 3 days, see your physician for further evaluation

## 2016-12-09 NOTE — ED Provider Notes (Signed)
[facility]    CSN: [id number] Arrival date & time: 12/09/16  1407     History   Chief Complaint Chief Complaint  Patient presents with  . Abdominal Pain    HPI Jeremy [name] is a 56 y.o. male.   Pt has been having RUQ gnawing pain for the last week.  He states eating takes the pain away for an hour or two.  He tried Zantac but claims the medication causes gas.  He states if he does not eat at least every two hours his heart races and he gets a headache.  Pt was unable to get in to see his PCP so he came here.  Patient states that he's been on Zantac for a number of years since he had an ulcer that was reportedly caused by taking too much ibuprofen when he had an orthopedic problem. Her graft he stopped the Zantac a couple weeks ago for day or 2 and then developed severe burping and gas. He restarted the Zantac but it didn't work.  Patient describes the pain is in the right upper quadrant and epigastrium. It's burning in nature. He's tried Pepto-Bismol which helped a little bit.  Patient works doing [employer].  Patient also notes that he has jock itch and a rash on his left leg that he's using athlete's foot spray 4.      Past Medical History:  Diagnosis Date  . Diabetes mellitus without complication ([place])   . Hypertension   . Rash of entire body 03/2016    Patient Active Problem List   Diagnosis Date Noted  . Varicella-zoster infection 04/12/2016  . Type 2 diabetes mellitus without complication, without long-term current use of insulin ([place]) 04/12/2016  . Essential hypertension 04/12/2016  . Rash 04/07/2016    Past Surgical History:  Procedure Laterality Date  . BACK SURGERY    . CERVICAL SPINE SURGERY    . HAND SURGERY         Home Medications    Prior to Admission medications   Medication Sig Start Date End Date Taking? Authorizing Provider  atorvastatin (LIPITOR) 40 MG tablet Take 1 tablet (40 mg total) by mouth daily. 10/26/16  Yes Maren Reamer, MD  Blood Glucose Monitoring Suppl (TRUE METRIX METER) w/Device KIT 1 each by Does not apply route 4 (four) times daily -  before meals and at bedtime. 04/12/16  Yes Elsie Stain, MD  diphenhydrAMINE (BENADRYL) 25 MG tablet Take 25 mg by mouth every 6 (six) hours as needed for itching or allergies. Reported on 04/12/2016   Yes Historical Provider, MD  glucose blood (TRUE METRIX BLOOD GLUCOSE TEST) test strip Use as instructed 04/12/16  Yes Elsie Stain, MD  glucose blood test strip Use as instructed 04/12/16  Yes Elsie Stain, MD  glucose monitoring kit (FREESTYLE) monitoring kit 1 each by Does not apply route as needed for other. Test glucose twice daily 04/12/16  Yes Elsie Stain, MD  hydrOXYzine (ATARAX/VISTARIL) 10 MG tablet Take 1 tablet (10 mg total) by mouth 3 (three) times daily as needed for anxiety (agitation, from clonidine w/d). 10/26/16  Yes Maren Reamer, MD  lisinopril-hydrochlorothiazide (PRINZIDE,ZESTORETIC) 10-12.5 MG tablet Take 1 tablet by mouth daily. 10/26/16  Yes Maren Reamer, MD  loratadine (CLARITIN) 10 MG tablet Take 1 tablet (10 mg total) by mouth daily. 10/26/16  Yes Maren Reamer, MD  metFORMIN (GLUCOPHAGE) 1000 MG tablet Take 1 tablet (1,000 mg total) by  mouth 2 (two) times daily with a meal. 10/26/16  Yes Maren Reamer, MD  naproxen sodium (ANAPROX) 220 MG tablet Take 440 mg by mouth 2 (two) times daily with a meal.   Yes Historical Provider, MD  OVER THE COUNTER MEDICATION Take 2 tablets by mouth at bedtime. "RESTLESS LEGS OTC"   Yes Historical Provider, MD  POTASSIUM GLUCONATE PO Take 1 tablet by mouth 2 (two) times daily.   Yes Historical Provider, MD  ranitidine (ZANTAC) 150 MG tablet Take 1 tablet (150 mg total) by mouth at bedtime. 10/26/16  Yes Maren Reamer, MD  tamsulosin (FLOMAX) 0.4 MG CAPS capsule Take 1 capsule (0.4 mg total) by mouth daily. 10/26/16  Yes Dawn Lazarus Gowda, MD  TRUEPLUS LANCETS 28G MISC 1 each by Does not  apply route 4 (four) times daily -  before meals and at bedtime. 04/12/16  Yes Elsie Stain, MD  acetaminophen (TYLENOL) 500 MG tablet Take 500 mg by mouth 2 (two) times daily.    Historical Provider, MD  omeprazole (PRILOSEC) 20 MG capsule Take 1 capsule (20 mg total) by mouth daily. 12/09/16   Robyn Haber, MD  sildenafil (VIAGRA) 50 MG tablet Take 1 tablet (50 mg total) by mouth daily as needed for erectile dysfunction. Trial 1/2 tab first to see if enough 10/26/16   Maren Reamer, MD    Family History Family History  Problem Relation Age of Onset  . Pancreatic cancer Mother   . CAD Father     Social History Social History  Substance Use Topics  . Smoking status: Former Smoker    Quit date: 10/16/1988  . Smokeless tobacco: Never Used  . Alcohol use No     Allergies   Patient has no known allergies.   Review of Systems Review of Systems  Constitutional: Negative.   HENT: Negative.   Respiratory: Negative.   Cardiovascular: Negative.   Gastrointestinal: Positive for abdominal distention and abdominal pain. Negative for constipation, diarrhea, nausea and vomiting.  Musculoskeletal: Negative.   Skin: Positive for rash.  Neurological: Negative.      Physical Exam Triage Vital Signs ED Triage Vitals [12/09/16 1438]  Enc Vitals Group     BP 136/86     Pulse Rate 78     Resp      Temp 97.9 F (36.6 C)     Temp src      SpO2 97 %     Weight      Height      Head Circumference      Peak Flow      Pain Score 4     Pain Loc      Pain Edu?      Excl. in [place]?    No data found.   Updated Vital Signs BP 136/86 (BP Location: Left Arm)   Pulse 78   Temp 97.9 F (36.6 C)   SpO2 97%    Physical Exam  Constitutional: He is oriented to person, place, and time. He appears well-developed and well-nourished.  HENT:  Head: Normocephalic.  Right Ear: External ear normal.  Left Ear: External ear normal.  Mouth/Throat: Oropharynx is clear and moist.  Eyes:  Conjunctivae and EOM are normal. Pupils are equal, round, and reactive to light.  Neck: Normal range of motion. Neck supple.  Cardiovascular: Normal rate, regular rhythm, normal heart sounds and intact distal pulses.   Pulmonary/Chest: Effort normal and breath sounds normal.  Abdominal: Soft. Bowel sounds are normal.  He exhibits distension. He exhibits no mass. There is no tenderness. There is no rebound and no guarding.  Musculoskeletal: Normal range of motion.  Neurological: He is alert and oriented to person, place, and time.  Skin: Skin is warm and dry. Rash noted.  Annular pink rash with some central clearing on the left inside knee  Nursing note and vitals reviewed.    UC Treatments / Results  Labs (all labs ordered are listed, but only abnormal results are displayed) Labs Reviewed - No data to display  EKG  EKG Interpretation None       Radiology No results found.  Procedures Procedures (including critical care time)  Medications Ordered in UC Medications  gi cocktail (Maalox,Lidocaine,Donnatal) (30 mLs Oral Given 12/09/16 1500)     Initial Impression / Assessment and Plan / UC Course  I have reviewed the triage vital signs and the nursing notes.  Pertinent labs & imaging results that were available during my care of the patient were reviewed by me and considered in my medical decision making (see chart for details).     Final Clinical Impressions(s) / UC Diagnoses   Final diagnoses:  Acute superficial gastritis without hemorrhage  Tinea corporis    New Prescriptions New Prescriptions   OMEPRAZOLE (PRILOSEC) 20 MG CAPSULE    Take 1 capsule (20 mg total) by mouth daily.     Robyn Haber, MD 12/09/16 [phone]

## 2016-12-09 NOTE — ED Triage Notes (Signed)
Pt has been having RUQ gnawing pain for the last week.  He states eating takes the pain away for an hour or two.  He tried Zantac but claims the medication causes gas.  He states if he does not eat at least every two hours his heart races and he gets a headache.  Pt was unable to get in to see his PCP so he came here.

## 2016-12-14 MED FILL — ?OMEPRAZOLE DR 20 MG CAPSUL: 20 | 15 days supply | Qty: 15 | Fill #0

## 2016-12-25 ENCOUNTER — Ambulatory Visit: Payer: Self-pay | Attending: Internal Medicine | Admitting: Internal Medicine

## 2016-12-25 ENCOUNTER — Encounter: Payer: Self-pay | Admitting: Internal Medicine

## 2016-12-25 ENCOUNTER — Ambulatory Visit (HOSPITAL_COMMUNITY)
Admission: RE | Admit: 2016-12-25 | Discharge: 2016-12-25 | Disposition: A | Discharge status: Home / Self Care | Payer: Self-pay | Source: Ambulatory Visit | Attending: Internal Medicine | Admitting: Internal Medicine

## 2016-12-25 VITALS — BP 137/69 | HR 66 | Temp 98.1°F | Resp 18 | Ht 67.5 in | Wt 235.6 lb

## 2016-12-25 DIAGNOSIS — M5442 Lumbago with sciatica, left side: Secondary | ICD-10-CM | POA: Insufficient documentation

## 2016-12-25 DIAGNOSIS — I10 Essential (primary) hypertension: Secondary | ICD-10-CM | POA: Insufficient documentation

## 2016-12-25 DIAGNOSIS — M545 Low back pain: Secondary | ICD-10-CM

## 2016-12-25 DIAGNOSIS — G8929 Other chronic pain: Secondary | ICD-10-CM

## 2016-12-25 DIAGNOSIS — Z23 Encounter for immunization: Secondary | ICD-10-CM | POA: Insufficient documentation

## 2016-12-25 DIAGNOSIS — M5136 Other intervertebral disc degeneration, lumbar region: Secondary | ICD-10-CM | POA: Insufficient documentation

## 2016-12-25 DIAGNOSIS — Z7984 Long term (current) use of oral hypoglycemic drugs: Secondary | ICD-10-CM | POA: Insufficient documentation

## 2016-12-25 DIAGNOSIS — E119 Type 2 diabetes mellitus without complications: Secondary | ICD-10-CM | POA: Insufficient documentation

## 2016-12-25 DIAGNOSIS — K219 Gastro-esophageal reflux disease without esophagitis: Secondary | ICD-10-CM | POA: Insufficient documentation

## 2016-12-25 DIAGNOSIS — M5441 Lumbago with sciatica, right side: Secondary | ICD-10-CM | POA: Insufficient documentation

## 2016-12-25 DIAGNOSIS — Z1211 Encounter for screening for malignant neoplasm of colon: Secondary | ICD-10-CM

## 2016-12-25 DIAGNOSIS — M48061 Spinal stenosis, lumbar region without neurogenic claudication: Secondary | ICD-10-CM | POA: Insufficient documentation

## 2016-12-25 LAB — GLUCOSE, POCT (MANUAL RESULT ENTRY): POC GLUCOSE: 173 mg/dL — AB (ref 70–99)

## 2016-12-25 MED ORDER — OMEPRAZOLE 40 MG PO CPDR: 40.0000 mg | DELAYED_RELEASE_CAPSULE | Freq: Every day | ORAL | 0 refills | Status: DC

## 2016-12-25 MED ORDER — LISINOPRIL-HYDROCHLOROTHIAZIDE 10-12.5 MG PO TABS: 1.0000 | ORAL_TABLET | Freq: Every day | ORAL | 3 refills | Status: DC

## 2016-12-25 MED ORDER — ATORVASTATIN CALCIUM 40 MG PO TABS: 40.0000 mg | ORAL_TABLET | Freq: Every day | ORAL | 3 refills | Status: DC

## 2016-12-25 MED ORDER — TAMSULOSIN HCL 0.4 MG PO CAPS: 0.4000 mg | ORAL_CAPSULE | Freq: Every day | ORAL | 3 refills | Status: DC

## 2016-12-25 MED ORDER — GLYBURIDE 5 MG PO TABS
5.0000 mg | ORAL_TABLET | Freq: Every day | ORAL | 3 refills | Status: DC
Start: 2016-12-25 — End: 2017-02-12

## 2016-12-25 MED ORDER — METFORMIN HCL 1000 MG PO TABS: 1000.0000 mg | ORAL_TABLET | Freq: Two times a day (BID) | ORAL | 3 refills | Status: DC

## 2016-12-25 MED ORDER — SUCRALFATE 1 GM/10ML PO SUSP: 1.0000 g | Freq: Three times a day (TID) | ORAL | 0 refills | Status: DC

## 2016-12-25 MED ORDER — DICLOFENAC SODIUM 1 % TD GEL: 2.0000 g | Freq: Four times a day (QID) | TRANSDERMAL | 2 refills | Status: DC

## 2016-12-25 MED FILL — LISINOPRIL-HCTZ 10-12.5 MG: 10-12.5 | 30 days supply | Qty: 30 | Fill #0

## 2016-12-25 MED FILL — ATORVASTATIN 40 MG TABLET: 40 | 30 days supply | Qty: 30 | Fill #0

## 2016-12-25 MED FILL — OMEPRAZOLE DR 40 MG CAPSULE: 40 | 30 days supply | Qty: 30 | Fill #0 | Status: TO

## 2016-12-25 MED FILL — ?TAMSULOSIN HCL 0.4 MG CAP: 0.4 | 30 days supply | Qty: 30 | Fill #0

## 2016-12-25 NOTE — Patient Instructions (Signed)
Low-Sodium Eating Plan Sodium, which is an element that makes up salt, helps you maintain a healthy balance of fluids in your body. Too much sodium can increase your blood pressure and cause fluid and waste to be held in your body. Your health care provider or dietitian may recommend following this plan if you have high blood pressure (hypertension), kidney disease, liver disease, or heart failure. Eating less sodium can help lower your blood pressure, reduce swelling, and protect your heart, liver, and kidneys. What are tips for following this plan? General guidelines   Most people on this plan should limit their sodium intake to 1,500-2,000 mg (milligrams) of sodium each day. Reading food labels   The Nutrition Facts label lists the amount of sodium in one serving of the food. If you eat more than one serving, you must multiply the listed amount of sodium by the number of servings.  Choose foods with less than 140 mg of sodium per serving.  Avoid foods with 300 mg of sodium or more per serving. Shopping   Look for lower-sodium products, often labeled as "low-sodium" or "no salt added."  Always check the sodium content even if foods are labeled as "unsalted" or "no salt added".  Buy fresh foods.  Avoid canned foods and premade or frozen meals.  Avoid canned, cured, or processed meats  Buy breads that have less than 80 mg of sodium per slice. Cooking   Eat more home-cooked food and less restaurant, buffet, and fast food.  Avoid adding salt when cooking. Use salt-free seasonings or herbs instead of table salt or sea salt. Check with your health care provider or pharmacist before using salt substitutes.  Cook with plant-based oils, such as canola, sunflower, or olive oil. Meal planning   When eating at a restaurant, ask that your food be prepared with less salt or no salt, if possible.  Avoid foods that contain MSG (monosodium glutamate). MSG is sometimes added to [place] food,  bouillon, and some canned foods. What foods are recommended? The items listed may not be a complete list. Talk with your dietitian about what dietary choices are best for you. Grains  Low-sodium cereals, including oats, puffed wheat and rice, and shredded wheat. Low-sodium crackers. Unsalted rice. Unsalted pasta. Low-sodium bread. Whole-grain breads and whole-grain pasta. Vegetables  Fresh or frozen vegetables. "No salt added" canned vegetables. "No salt added" tomato sauce and paste. Low-sodium or reduced-sodium tomato and vegetable juice. Fruits  Fresh, frozen, or canned fruit. Fruit juice. Meats and other protein foods  Fresh or frozen (no salt added) meat, poultry, seafood, and fish. Low-sodium canned tuna and salmon. Unsalted nuts. Dried peas, beans, and lentils without added salt. Unsalted canned beans. Eggs. Unsalted nut butters. Dairy  Milk. Soy milk. Cheese that is naturally low in sodium, such as ricotta cheese, fresh mozzarella, or Swiss cheese Low-sodium or reduced-sodium cheese. Cream cheese. Yogurt. Fats and oils  Unsalted butter. Unsalted margarine with no trans fat. Vegetable oils such as canola or olive oils. Seasonings and other foods  Fresh and dried herbs and spices. Salt-free seasonings. Low-sodium mustard and ketchup. Sodium-free salad dressing. Sodium-free light mayonnaise. Fresh or refrigerated horseradish. Lemon juice. Vinegar. Homemade, reduced-sodium, or low-sodium soups. Unsalted popcorn and pretzels. Low-salt or salt-free chips. What foods are not recommended? The items listed may not be a complete list. Talk with your dietitian about what dietary choices are best for you. Grains  Instant hot cereals. Bread stuffing, pancake, and biscuit mixes. Croutons. Seasoned rice or pasta  mixes. Noodle soup cups. Boxed or frozen macaroni and cheese. Regular salted crackers. Self-rising flour. Vegetables  Sauerkraut, pickled vegetables, and relishes. Olives. [place] fries. Onion  rings. Regular canned vegetables (not low-sodium or reduced-sodium). Regular canned tomato sauce and paste (not low-sodium or reduced-sodium). Regular tomato and vegetable juice (not low-sodium or reduced-sodium). Frozen vegetables in sauces. Meats and other protein foods  Meat or fish that is salted, canned, smoked, spiced, or pickled. Bacon, ham, sausage, hotdogs, corned beef, chipped beef, packaged lunch meats, salt pork, jerky, pickled herring, anchovies, regular canned tuna, sardines, salted nuts. Dairy  Processed cheese and cheese spreads. Cheese curds. Blue cheese. Feta cheese. String cheese. Regular cottage cheese. Buttermilk. Canned milk. Fats and oils  Salted butter. Regular margarine. Ghee. Bacon fat. Seasonings and other foods  Onion salt, garlic salt, seasoned salt, table salt, and sea salt. Canned and packaged gravies. Worcestershire sauce. Tartar sauce. Barbecue sauce. Teriyaki sauce. Soy sauce, including reduced-sodium. Steak sauce. Fish sauce. Oyster sauce. Cocktail sauce. Horseradish that you find on the shelf. Regular ketchup and mustard. Meat flavorings and tenderizers. Bouillon cubes. Hot sauce and Tabasco sauce. Premade or packaged marinades. Premade or packaged taco seasonings. Relishes. Regular salad dressings. Salsa. Potato and tortilla chips. Corn chips and puffs. Salted popcorn and pretzels. Canned or dried soups. Pizza. Frozen entrees and pot pies. Summary  Eating less sodium can help lower your blood pressure, reduce swelling, and protect your heart, liver, and kidneys.  Most people on this plan should limit their sodium intake to 1,500-2,000 mg (milligrams) of sodium each day.  Canned, boxed, and frozen foods are high in sodium. Restaurant foods, fast foods, and pizza are also very high in sodium. You also get sodium by adding salt to food.  Try to cook at home, eat more fresh fruits and vegetables, and eat less fast food, canned, processed, or prepared foods. This  information is not intended to replace advice given to you by your health care provider. Make sure you discuss any questions you have with your health care provider. Document Released: 03/24/2002 Document Revised: 09/25/2016 Document Reviewed: 09/25/2016 Elsevier Interactive Patient Education  2017 [employer]   -   Hypertension Hypertension is another name for high blood pressure. High blood pressure forces your heart to work harder to pump blood. This can cause problems over time. There are two numbers in a blood pressure reading. There is a top number (systolic) over a bottom number (diastolic). It is best to have a blood pressure below 120/80. Healthy choices can help lower your blood pressure. You may need medicine to help lower your blood pressure if:  Your blood pressure cannot be lowered with healthy choices.  Your blood pressure is higher than 130/80. Follow these instructions at home: Eating and drinking   If directed, follow the DASH eating plan. This diet includes:  Filling half of your plate at each meal with fruits and vegetables.  Filling one quarter of your plate at each meal with whole grains. Whole grains include whole wheat pasta, brown rice, and whole grain bread.  Eating or drinking low-fat dairy products, such as skim milk or low-fat yogurt.  Filling one quarter of your plate at each meal with low-fat (lean) proteins. Low-fat proteins include fish, skinless chicken, eggs, beans, and tofu.  Avoiding fatty meat, cured and processed meat, or chicken with skin.  Avoiding premade or processed food.  Eat less than 1,500 mg of salt (sodium) a day.  Limit alcohol use to no more than 1  drink a day for nonpregnant women and 2 drinks a day for men. One drink equals 12 oz of beer, 5 oz of wine, or 1 oz of hard liquor. Lifestyle   Work with your doctor to stay at a healthy weight or to lose weight. Ask your doctor what the best weight is for you.  Get at least 30  minutes of exercise that causes your heart to beat faster (aerobic exercise) most days of the week. This may include walking, swimming, or biking.  Get at least 30 minutes of exercise that strengthens your muscles (resistance exercise) at least 3 days a week. This may include lifting weights or pilates.  Do not use any products that contain nicotine or tobacco. This includes cigarettes and e-cigarettes. If you need help quitting, ask your doctor.  Check your blood pressure at home as told by your doctor.  Keep all follow-up visits as told by your doctor. This is important. Medicines   Take over-the-counter and prescription medicines only as told by your doctor. Follow directions carefully.  Do not skip doses of blood pressure medicine. The medicine does not work as well if you skip doses. Skipping doses also puts you at risk for problems.  Ask your doctor about side effects or reactions to medicines that you should watch for. Contact a doctor if:  You think you are having a reaction to the medicine you are taking.  You have headaches that keep coming back (recurring).  You feel dizzy.  You have swelling in your ankles.  You have trouble with your vision. Get help right away if:  You get a very bad headache.  You start to feel confused.  You feel weak or numb.  You feel faint.  You get very bad pain in your:  Chest.  Belly (abdomen).  You throw up (vomit) more than once.  You have trouble breathing. Summary  Hypertension is another name for high blood pressure.  Making healthy choices can help lower blood pressure. If your blood pressure cannot be controlled with healthy choices, you may need to take medicine. This information is not intended to replace advice given to you by your health care provider. Make sure you discuss any questions you have with your health care provider. Document Released: 03/20/2008 Document Revised: 08/30/2016 Document Reviewed:  08/30/2016 Elsevier Interactive Patient Education  2017 [employer].  -   Tips for Eating Away From Home If You Have Diabetes Controlling your level of blood glucose, also known as blood sugar, can be challenging. It can be even more difficult when you do not prepare your own meals. The following tips can help you manage your diabetes when you eat away from home. Planning ahead Plan ahead if you know you will be eating away from home:  Ask your health care provider how to time meals and medicine if you are taking insulin.  Make a list of restaurants near you that offer healthy choices. If they have a carry-out menu, take it home and plan what you will order ahead of time.  Look up the restaurant you want to eat at online. Many chain and fast-food restaurants list nutritional information online. Use this information to choose the healthiest options and to calculate how many carbohydrates will be in your meal.  Use a carbohydrate-counting book or mobile app to look up the carbohydrate content and serving size of the foods you want to eat.  Become familiar with serving sizes and learn to recognize how many servings  are in a portion. This will allow you to estimate how many carbohydrates you can eat. Free foods A "free food" is any food or drink that has less than 5 g of carbohydrates per serving. Free foods include:  Many vegetables.  Hard boiled eggs.  Nuts or seeds.  Olives.  Cheeses.  Meats. These types of foods make good appetizer choices and are often available at salad bars. Lemon juice, vinegar, or a low-calorie salad dressing of fewer than 20 calories per serving can be used as a "free" salad dressing. Choices to reduce carbohydrates  Substitute nonfat sweetened yogurt with a sugar-free yogurt. Yogurt made from soy milk may also be used, but you will still want a sugar-free or plain option to choose a lower carbohydrate amount.  Ask your server to take away the bread  basket or chips from your table.  Order fresh fruit. A salad bar often offers fresh fruit choices. Avoid canned fruit because it is usually packed in sugar or syrup.  Order a salad, and eat it without dressing. Or, create a "free" salad dressing.  Ask for substitutions. For example, instead of [place] fries, request an order of a vegetable such as salad, green beans, or broccoli. Other tips  If you take insulin, take the insulin once your food arrives to your table. This will ensure your insulin and food are timed correctly.  Ask your server about the portion size before your order, and ask for a take-out box if the portion has more servings than you should have. When your food comes, leave the amount you should have on the plate, and put the rest in the take-out box.  Consider splitting an entree with someone and ordering a side salad. This information is not intended to replace advice given to you by your health care provider. Make sure you discuss any questions you have with your health care provider. Document Released: 10/02/2005 Document Revised: 03/09/2016 Document Reviewed: 12/30/2013 Elsevier Interactive Patient Education  2017 [employer]  - Diabetes Mellitus and Food It is important for you to manage your blood sugar (glucose) level. Your blood glucose level can be greatly affected by what you eat. Eating healthier foods in the appropriate amounts throughout the day at about the same time each day will help you control your blood glucose level. It can also help slow or prevent worsening of your diabetes mellitus. Healthy eating may even help you improve the level of your blood pressure and reach or maintain a healthy weight. General recommendations for healthful eating and cooking habits include:  Eating meals and snacks regularly. Avoid going long periods of time without eating to lose weight.  Eating a diet that consists mainly of plant-based foods, such as fruits, vegetables,  nuts, legumes, and whole grains.  Using low-heat cooking methods, such as baking, instead of high-heat cooking methods, such as deep frying. Work with your dietitian to make sure you understand how to use the Nutrition Facts information on food labels. How can food affect me? Carbohydrates  Carbohydrates affect your blood glucose level more than any other type of food. Your dietitian will help you determine how many carbohydrates to eat at each meal and teach you how to count carbohydrates. Counting carbohydrates is important to keep your blood glucose at a healthy level, especially if you are using insulin or taking certain medicines for diabetes mellitus. Alcohol  Alcohol can cause sudden decreases in blood glucose (hypoglycemia), especially if you use insulin or take certain medicines for  diabetes mellitus. Hypoglycemia can be a life-threatening condition. Symptoms of hypoglycemia (sleepiness, dizziness, and disorientation) are similar to symptoms of having too much alcohol. If your health care provider has given you approval to drink alcohol, do so in moderation and use the following guidelines:  Women should not have more than one drink per day, and men should not have more than two drinks per day. One drink is equal to:  12 oz of beer.  5 oz of wine.  1 oz of hard liquor.  Do not drink on an empty stomach.  Keep yourself hydrated. Have water, diet soda, or unsweetened iced tea.  Regular soda, juice, and other mixers might contain a lot of carbohydrates and should be counted. What foods are not recommended? As you make food choices, it is important to remember that all foods are not the same. Some foods have fewer nutrients per serving than other foods, even though they might have the same number of calories or carbohydrates. It is difficult to get your body what it needs when you eat foods with fewer nutrients. Examples of foods that you should avoid that are high in calories and  carbohydrates but low in nutrients include:  Trans fats (most processed foods list trans fats on the Nutrition Facts label).  Regular soda.  Juice.  Candy.  Sweets, such as cake, pie, doughnuts, and cookies.  Fried foods. What foods can I eat? Eat nutrient-rich foods, which will nourish your body and keep you healthy. The food you should eat also will depend on several factors, including:  The calories you need.  The medicines you take.  Your weight.  Your blood glucose level.  Your blood pressure level.  Your cholesterol level. You should eat a variety of foods, including:  Protein.  Lean cuts of meat.  Proteins low in saturated fats, such as fish, egg whites, and beans. Avoid processed meats.  Fruits and vegetables.  Fruits and vegetables that may help control blood glucose levels, such as apples, mangoes, and yams.  Dairy products.  Choose fat-free or low-fat dairy products, such as milk, yogurt, and cheese.  Grains, bread, pasta, and rice.  Choose whole grain products, such as multigrain bread, whole oats, and brown rice. These foods may help control blood pressure.  Fats.  Foods containing healthful fats, such as nuts, avocado, olive oil, canola oil, and fish. Does everyone with diabetes mellitus have the same meal plan? Because every person with diabetes mellitus is different, there is not one meal plan that works for everyone. It is very important that you meet with a dietitian who will help you create a meal plan that is just right for you. This information is not intended to replace advice given to you by your health care provider. Make sure you discuss any questions you have with your health care provider. Document Released: 06/29/2005 Document Revised: 03/09/2016 Document Reviewed: 08/29/2013 Elsevier Interactive Patient Education  2017 [employer]

## 2016-12-25 NOTE — Progress Notes (Signed)
Patient is here for medication concern.  Patient denies pain at this time.  Patient has taken medication today. Patient has not eaten today.  Patient complains of gas/stomach cramping/black stool while taking zantac. Urgent care provided Prilosec as an alternative.  Patient tolerated pneumo 23 and tdap well today.

## 2016-12-25 NOTE — Progress Notes (Signed)
Jeremy [name], is a 56 y.o. male  QNV:[id number]  BMB:[id number]  DOB - 1961-02-20  Chief Complaint  Patient presents with  . Medication Problem        Subjective:   Jeremy [name] is a 56 y.o. male here today for a follow up visit.  Recent urgent care visit 2/24 for indigestion/increase gas/distenstion/abd discomfort w/ zantac for which he has been on for years. They gave him omeprazole 20 qd which is helping some. Of note, hx of ulcers, still taking naproxen daily and tylenol daily for pain. He states he tries to take them w/ food.  Wakes up w/ stomach discomfort/aches, and better w/ food.  Pt had dark stools weeks ago when saw urgent care, thought had pud, but now improving. Stool caliber and color nml now. Denies hematochezia/melena/hemetemasis.  Now [facility].  per pt thought he saw worms in his stool, but white tubes, not moving.   Patient has No headache, No chest pain, No abdominal pain currently - No Nausea, No new weakness tingling or numbness, No Cough - SOB.  No problems updated.  ALLERGIES: No Known Allergies  PAST MEDICAL HISTORY: Past Medical History:  Diagnosis Date  . Diabetes mellitus without complication ([place])   . Hypertension   . Rash of entire body 03/2016    MEDICATIONS AT HOME: Prior to Admission medications   Medication Sig Start Date End Date Taking? Authorizing Provider  acetaminophen (TYLENOL) 500 MG tablet Take 500 mg by mouth 2 (two) times daily.   Yes Historical Provider, MD  atorvastatin (LIPITOR) 40 MG tablet Take 1 tablet (40 mg total) by mouth daily. 12/25/16  Yes Maren Reamer, MD  Blood Glucose Monitoring Suppl (TRUE METRIX METER) w/Device KIT 1 each by Does not apply route 4 (four) times daily -  before meals and at bedtime. 04/12/16  Yes Elsie Stain, MD  diphenhydrAMINE (BENADRYL) 25 MG tablet Take 25 mg by mouth every 6 (six) hours as needed for itching or allergies. Reported on 04/12/2016   Yes Historical  Provider, MD  glucose blood (TRUE METRIX BLOOD GLUCOSE TEST) test strip Use as instructed 04/12/16  Yes Elsie Stain, MD  glucose blood test strip Use as instructed 04/12/16  Yes Elsie Stain, MD  glucose monitoring kit (FREESTYLE) monitoring kit 1 each by Does not apply route as needed for other. Test glucose twice daily 04/12/16  Yes Elsie Stain, MD  lisinopril-hydrochlorothiazide (PRINZIDE,ZESTORETIC) 10-12.5 MG tablet Take 1 tablet by mouth daily. 12/25/16  Yes Maren Reamer, MD  loratadine (CLARITIN) 10 MG tablet Take 1 tablet (10 mg total) by mouth daily. 10/26/16  Yes Maren Reamer, MD  metFORMIN (GLUCOPHAGE) 1000 MG tablet Take 1 tablet (1,000 mg total) by mouth 2 (two) times daily with a meal. 12/25/16  Yes Maren Reamer, MD  naproxen sodium (ANAPROX) 220 MG tablet Take 440 mg by mouth 2 (two) times daily with a meal.   Yes Historical Provider, MD  omeprazole (PRILOSEC) 40 MG capsule Take 1 capsule (40 mg total) by mouth daily. 12/25/16  Yes Maren Reamer, MD  OVER THE COUNTER MEDICATION Take 2 tablets by mouth at bedtime. "RESTLESS LEGS OTC"   Yes Historical Provider, MD  POTASSIUM GLUCONATE PO Take 1 tablet by mouth 2 (two) times daily.   Yes Historical Provider, MD  sildenafil (VIAGRA) 50 MG tablet Take 1 tablet (50 mg total) by mouth daily as needed for erectile dysfunction. Trial 1/2 tab first to see  if enough 10/26/16  Yes Maren Reamer, MD  tamsulosin (FLOMAX) 0.4 MG CAPS capsule Take 1 capsule (0.4 mg total) by mouth daily. 12/25/16  Yes Wylan Gentzler Lazarus Gowda, MD  TRUEPLUS LANCETS 28G MISC 1 each by Does not apply route 4 (four) times daily -  before meals and at bedtime. 04/12/16  Yes Elsie Stain, MD  diclofenac sodium (VOLTAREN) 1 % GEL Apply 2 g topically 4 (four) times daily. 12/25/16   Maren Reamer, MD  glyBURIDE (DIABETA) 5 MG tablet Take 1 tablet (5 mg total) by mouth daily with breakfast. 12/25/16   Maren Reamer, MD  sucralfate (CARAFATE) 1 GM/10ML  suspension Take 10 mLs (1 g total) by mouth 4 (four) times daily -  with meals and at bedtime. 12/25/16   Maren Reamer, MD     Objective:   Vitals:   12/25/16 0925  BP: 137/69  Pulse: 66  Resp: 18  Temp: 98.1 F (36.7 C)  TempSrc: Oral  SpO2: 98%  Weight: 235 lb 9.6 oz (106.9 kg)  Height: 5' 7.5" (1.715 m)    Exam General appearance : Awake, alert, not in any distress. Speech Clear. Not toxic looking, pleasant. Obese. HEENT: Atraumatic and Normocephalic, pupils equally reactive to light. Neck: supple, no JVD.  Chest:Good air entry bilaterally, no added sounds. Nttp on palpation of chest. CVS: S1 S2 regular, no murmurs/gallups or rubs. Abdomen: Bowel sounds active, obese, Non tender and not distended with no gaurding, rigidity or rebound. Extremities: B/L Lower Ext shows no edema, both legs are warm to touch Neurology: Awake alert, and oriented X 3, CN II-XII grossly intact, Non focal Skin:No Rash  Data Review Lab Results  Component Value Date   HGBA1C 7.4 10/17/2016   HGBA1C 9.5 (H) 04/07/2016    Depression screen PHQ 2/9 10/17/2016 06/02/2016 04/12/2016 04/12/2016  Decreased Interest 0 0 0 0  Down, Depressed, Hopeless 0 0 0 0  PHQ - 2 Score 0 0 0 0  Altered sleeping 0 - 0 -  Tired, decreased energy 0 - 2 -  Change in appetite 0 - 2 -  Feeling bad or failure about yourself  0 - 1 -  Trouble concentrating 0 - 0 -  Moving slowly or fidgety/restless 0 - 0 -  Suicidal thoughts 0 - 0 -  PHQ-9 Score 0 - 5 -      Assessment & Plan   1. Type 2 diabetes mellitus without complication, without long-term current use of insulin ([facility]) - add glyburide 5 qd, cautioned hypoglycemia w/ this rx and signs/sx to watch out for. - continue metformin 1000bid - Glucose (CBG) 173 (a1c 7.4 on 10/17/16) - Microalbumin/Creatinine Ratio, Urine  2. Colon cancer screening - Ambulatory referral to Gastroenterology - Hemoccult - 1 Card (office) - Ova and parasite examination -   3. Jerrye Bushy,  concern for pud - increase ppi to 40 qd - added carafate suspension w/ qid x 2 wks  - gi cs., may need egd - avoid NSAIDS.  4. Chronic bilateral low back pain, with sciatica presence unspecified - trail voltaren gel for pain, concern causing ulcers. - avoid NSAIDS as able. - Ambulatory referral to Physical Medicine Rehab - DG Lumbar Spine Complete; Future  5. HTN (hypertension), benign Well controlled - cont prinzide 10-12.5 qd - prior had panic attacks /w/d w/ clonidine - resolved.  6. Pneumococcal 23 v and tdap today.   Patient have been counseled extensively about nutrition and exercise  Return in about  3 months (around 03/27/2017), or if symptoms worsen or fail to improve.  The patient was given clear instructions to go to ER or return to medical center if symptoms don't improve, worsen or new problems develop. The patient verbalized understanding. The patient was told to call to get lab results if they haven't heard anything in the next week.   This note has been created with Surveyor, quantity. Any transcriptional errors are unintentional.   Maren Reamer, MD, [place] [facility] [place], [place]   12/25/2016, 10:24 AM

## 2016-12-26 ENCOUNTER — Other Ambulatory Visit: Payer: Self-pay | Admitting: Internal Medicine

## 2016-12-26 DIAGNOSIS — M48061 Spinal stenosis, lumbar region without neurogenic claudication: Secondary | ICD-10-CM

## 2016-12-26 MED FILL — ?METFORMIN HCL 1,000 MG TAB: 1000 | 30 days supply | Qty: 60 | Fill #2

## 2016-12-27 LAB — MICROALBUMIN / CREATININE URINE RATIO
CREATININE, URINE: 190 mg/dL (ref 20–370)
MICROALB UR: 2.5 mg/dL
MICROALB/CREAT RATIO: 13 ug/mg{creat} (ref <30)

## 2016-12-29 LAB — OVA AND PARASITE EXAMINATION: OP: NONE SEEN

## 2017-01-04 ENCOUNTER — Telehealth: Payer: Self-pay

## 2017-01-04 DIAGNOSIS — Z48816 Encounter for surgical aftercare following surgery on the genitourinary system: Secondary | ICD-10-CM

## 2017-01-04 NOTE — Telephone Encounter (Signed)
Contacted pt to go over lab results pt is aware of results. Pt states he had some other concerns when he was seen in the office. Pt states he doesn't know if he needs to see a urologist or a general surgeon but he needs to be circumcised. Pt states he has been out of his metformin for a week and when they had it ready he didn't have the money for it. Pt states sometimes he feel lightheaded and he checks his sugar his sugar be in the normal range I ask patient does he check his bp and he states he doesn't I informed pt that when he feels like this he needs to check his bp and his sugar pt states he understand and doesn't have any questions or concerns

## 2017-01-04 NOTE — Telephone Encounter (Signed)
While I talked with pt about lab results I also made aware fo his MRI appointment that is scheduled for January 13, 2017 @9am  but need to [facility]

## 2017-01-06 NOTE — Addendum Note (Signed)
Addended byDierdre Searles: Jalyiah Shelley T on: 01/06/2017 07:22 AM   Modules accepted: Orders

## 2017-01-06 NOTE — Telephone Encounter (Signed)
Agree  - next time he feels lightheaded, check his cbg and bp. Or come in for Triage RN visit. I put in referral for Uro for eval for circumcision.

## 2017-01-08 ENCOUNTER — Other Ambulatory Visit: Payer: Self-pay | Admitting: Internal Medicine

## 2017-01-08 ENCOUNTER — Ambulatory Visit: Payer: Self-pay | Attending: Internal Medicine

## 2017-01-08 ENCOUNTER — Encounter: Payer: Self-pay | Admitting: Internal Medicine

## 2017-01-08 DIAGNOSIS — I10 Essential (primary) hypertension: Secondary | ICD-10-CM

## 2017-01-08 DIAGNOSIS — M25562 Pain in left knee: Principal | ICD-10-CM

## 2017-01-08 DIAGNOSIS — M25561 Pain in right knee: Secondary | ICD-10-CM

## 2017-01-08 MED ORDER — OMEPRAZOLE 20 MG PO CPDR: 20.0000 mg | DELAYED_RELEASE_CAPSULE | Freq: Every day | ORAL | 3 refills | Status: DC

## 2017-01-08 NOTE — Progress Notes (Signed)
Will reduce protonix back to 20mg  qd.

## 2017-01-09 ENCOUNTER — Other Ambulatory Visit: Payer: Self-pay | Admitting: Internal Medicine

## 2017-01-09 LAB — BASIC METABOLIC PANEL
BUN / CREAT RATIO: 16 (ref 9–20)
BUN: 12 mg/dL (ref 6–24)
CHLORIDE: 100 mmol/L (ref 96–106)
CO2: 25 mmol/L (ref 18–29)
Calcium: 9.1 mg/dL (ref 8.7–10.2)
Creatinine, Ser: 0.76 mg/dL (ref 0.76–1.27)
GFR calc Af Amer: 120 mL/min/{1.73_m2} (ref >59)
GFR calc non Af Amer: 104 mL/min/{1.73_m2} (ref >59)
GLUCOSE: 223 mg/dL — AB (ref 65–99)
POTASSIUM: 3.4 mmol/L — AB (ref 3.5–5.2)
SODIUM: 142 mmol/L (ref 134–144)

## 2017-01-09 MED ORDER — POTASSIUM CHLORIDE ER 10 MEQ PO TBCR: 10.0000 meq | EXTENDED_RELEASE_TABLET | Freq: Every day | ORAL | 2 refills | Status: DC

## 2017-01-09 NOTE — Telephone Encounter (Signed)
Patient request

## 2017-01-10 ENCOUNTER — Telehealth: Payer: Self-pay | Admitting: Internal Medicine

## 2017-01-10 NOTE — Telephone Encounter (Signed)
Contacted pt and just made him aware that he can stop by tomorrow to pick up 3 new stool cards

## 2017-01-10 NOTE — Telephone Encounter (Signed)
Pt called stating that he had picked up some equipment for an at home test and that he messed them up. He states it was for 2 cc tests and that he dropped them in the toilet. Please f/u with pt.

## 2017-01-13 ENCOUNTER — Ambulatory Visit (HOSPITAL_COMMUNITY)
Admission: RE | Admit: 2017-01-13 | Discharge: 2017-01-13 | Disposition: A | Discharge status: Home / Self Care | Payer: Self-pay | Source: Ambulatory Visit | Attending: Internal Medicine | Admitting: Internal Medicine

## 2017-01-13 DIAGNOSIS — M4807 Spinal stenosis, lumbosacral region: Secondary | ICD-10-CM | POA: Insufficient documentation

## 2017-01-13 DIAGNOSIS — M5126 Other intervertebral disc displacement, lumbar region: Secondary | ICD-10-CM | POA: Insufficient documentation

## 2017-01-13 DIAGNOSIS — M48061 Spinal stenosis, lumbar region without neurogenic claudication: Secondary | ICD-10-CM | POA: Insufficient documentation

## 2017-01-13 MED ORDER — GADOBENATE DIMEGLUMINE 529 MG/ML IV SOLN
20.0000 mL | Freq: Once | INTRAVENOUS | Status: AC | PRN
  Administered 2017-01-13: 20 mL via INTRAVENOUS

## 2017-01-17 ENCOUNTER — Telehealth: Payer: Self-pay

## 2017-01-17 NOTE — Telephone Encounter (Signed)
Pt would like to have colonoscopy scheduled as well as urologist appointment scheduled.

## 2017-01-19 ENCOUNTER — Other Ambulatory Visit: Payer: Self-pay | Admitting: Pharmacist

## 2017-01-19 DIAGNOSIS — Z1211 Encounter for screening for malignant neoplasm of colon: Secondary | ICD-10-CM

## 2017-01-22 ENCOUNTER — Other Ambulatory Visit: Payer: Self-pay | Admitting: Internal Medicine

## 2017-01-22 DIAGNOSIS — M48061 Spinal stenosis, lumbar region without neurogenic claudication: Secondary | ICD-10-CM

## 2017-01-25 MED FILL — LISINOPRIL-HCTZ 10-12.5 MG: 10-12.5 | 30 days supply | Qty: 30 | Fill #1

## 2017-02-12 ENCOUNTER — Other Ambulatory Visit: Payer: Self-pay | Admitting: Internal Medicine

## 2017-02-12 MED ORDER — GLYBURIDE 5 MG PO TABS: 5.0000 mg | ORAL_TABLET | Freq: Every day | ORAL | 3 refills | Status: DC

## 2017-02-14 MED FILL — glyBURIDE 5 MG TABS: 5 | 30 days supply | Qty: 30 | Fill #0

## 2017-02-14 MED FILL — metFORMIN HCL 1000 MG TABS: 1000 | 30 days supply | Qty: 60 | Fill #3

## 2017-02-14 MED FILL — ?ATORVASTATIN 40MG TABLET: 40 | 30 days supply | Qty: 30 | Fill #1

## 2017-02-19 MED FILL — LISINOPRIL-HCTZ 10-12.5 MG: 10-12.5 | 30 days supply | Qty: 30 | Fill #2

## 2017-02-20 MED FILL — TAMSULOSIN HCL 0.4 MG CAP: 0.4 | 30 days supply | Qty: 30 | Fill #2

## 2017-03-01 ENCOUNTER — Encounter: Payer: Self-pay | Admitting: Internal Medicine

## 2017-03-02 ENCOUNTER — Other Ambulatory Visit: Payer: Self-pay | Admitting: Urology

## 2017-03-19 ENCOUNTER — Encounter (HOSPITAL_BASED_OUTPATIENT_CLINIC_OR_DEPARTMENT_OTHER): Payer: Self-pay | Admitting: *Deleted

## 2017-03-19 NOTE — Progress Notes (Signed)
[facility] on [facility]-Ekg with chart Npo after Mn.

## 2017-03-23 MED FILL — ?TAMSULOSIN HCL 0.4 MG CAP: 0.4 | 30 days supply | Qty: 30 | Fill #3

## 2017-03-23 MED FILL — metFORMIN HCL 1000 MG TABS: 1000 | 30 days supply | Qty: 60 | Fill #4

## 2017-03-23 MED FILL — ATORVASTATIN 40 MG TABLET: 40 | 30 days supply | Qty: 30 | Fill #2

## 2017-03-23 MED FILL — glyBURIDE 5 MG TABS: 5 | 30 days supply | Qty: 30 | Fill #1

## 2017-03-23 MED FILL — LISINOPRIL-HCTZ 10-12.5 MG: 10-12.5 | 30 days supply | Qty: 30 | Fill #3

## 2017-03-29 ENCOUNTER — Ambulatory Visit (HOSPITAL_BASED_OUTPATIENT_CLINIC_OR_DEPARTMENT_OTHER): Admission: RE | Admit: 2017-03-29 | Payer: Self-pay | Source: Ambulatory Visit | Admitting: Urology

## 2017-03-29 SURGERY — CIRCUMCISION, ADULT
Anesthesia: General

## 2017-04-06 ENCOUNTER — Ambulatory Visit (HOSPITAL_COMMUNITY)
Admission: RE | Admit: 2017-04-06 | Discharge: 2017-04-06 | Disposition: A | Discharge status: Home / Self Care | Payer: Self-pay | Source: Ambulatory Visit | Attending: Internal Medicine | Admitting: Internal Medicine

## 2017-04-06 DIAGNOSIS — X58XXXA Exposure to other specified factors, initial encounter: Secondary | ICD-10-CM | POA: Insufficient documentation

## 2017-04-06 DIAGNOSIS — M11262 Other chondrocalcinosis, left knee: Secondary | ICD-10-CM | POA: Insufficient documentation

## 2017-04-06 DIAGNOSIS — S83011A Lateral subluxation of right patella, initial encounter: Secondary | ICD-10-CM | POA: Insufficient documentation

## 2017-04-06 DIAGNOSIS — M25562 Pain in left knee: Secondary | ICD-10-CM

## 2017-04-06 DIAGNOSIS — M25561 Pain in right knee: Secondary | ICD-10-CM

## 2017-04-13 ENCOUNTER — Encounter: Payer: Self-pay | Admitting: Internal Medicine

## 2017-04-13 ENCOUNTER — Ambulatory Visit: Payer: Self-pay | Attending: Internal Medicine | Admitting: Internal Medicine

## 2017-04-13 VITALS — BP 146/81 | HR 81 | Temp 98.2°F | Resp 16 | Wt 233.0 lb

## 2017-04-13 DIAGNOSIS — E119 Type 2 diabetes mellitus without complications: Secondary | ICD-10-CM

## 2017-04-13 DIAGNOSIS — B356 Tinea cruris: Secondary | ICD-10-CM

## 2017-04-13 DIAGNOSIS — M25561 Pain in right knee: Secondary | ICD-10-CM

## 2017-04-13 DIAGNOSIS — Z87891 Personal history of nicotine dependence: Secondary | ICD-10-CM | POA: Insufficient documentation

## 2017-04-13 DIAGNOSIS — I10 Essential (primary) hypertension: Secondary | ICD-10-CM

## 2017-04-13 DIAGNOSIS — M545 Low back pain: Secondary | ICD-10-CM

## 2017-04-13 DIAGNOSIS — K219 Gastro-esophageal reflux disease without esophagitis: Secondary | ICD-10-CM

## 2017-04-13 DIAGNOSIS — Z1211 Encounter for screening for malignant neoplasm of colon: Secondary | ICD-10-CM

## 2017-04-13 DIAGNOSIS — M25562 Pain in left knee: Secondary | ICD-10-CM

## 2017-04-13 DIAGNOSIS — Z7984 Long term (current) use of oral hypoglycemic drugs: Secondary | ICD-10-CM | POA: Insufficient documentation

## 2017-04-13 DIAGNOSIS — Z79899 Other long term (current) drug therapy: Secondary | ICD-10-CM | POA: Insufficient documentation

## 2017-04-13 DIAGNOSIS — G8929 Other chronic pain: Secondary | ICD-10-CM

## 2017-04-13 MED ORDER — DULOXETINE HCL 20 MG PO CPEP: 20.0000 mg | ORAL_CAPSULE | Freq: Every day | ORAL | 3 refills | Status: DC

## 2017-04-13 MED ORDER — DICLOFENAC SODIUM 1 % TD GEL: 2.0000 g | Freq: Four times a day (QID) | TRANSDERMAL | 2 refills | Status: DC

## 2017-04-13 MED ORDER — OMEPRAZOLE 20 MG PO CPDR: 20.0000 mg | DELAYED_RELEASE_CAPSULE | Freq: Every day | ORAL | 3 refills | Status: DC

## 2017-04-13 MED ORDER — MICONAZOLE NITRATE 2 % EX CREA: 1.0000 "application " | TOPICAL_CREAM | Freq: Two times a day (BID) | CUTANEOUS | 0 refills | Status: DC

## 2017-04-13 NOTE — Progress Notes (Signed)
Patient ID: DEDRICK [name], male    DOB: November 02, 1960  MRN: [id number]  CC: re-establish   Subjective: Jeremy [name] is a 56 y.o. male who presents for chronic ds management and to become establish with me as PCP. Jeremy [name], is with him. His concerns today include:  Pt with hx of DM, HL, HTN,GERD, chronic LBP. Last saw Jeremy [name] 12/2016  1. DM: -Glucotrol added on last visit. However he discontinued taking due to frequent hypoglycemia.  -checks blood sugars several times a wk in a.m with range 130-150 -eating habits: feels like he  over eating but tries to control portion sizes.  Eating some veggies  2. HTN Compliant with Lisinopril/HCTZ -limits salt Has device but not checking regularly  3.  Colon CA screening -He has the [facility] card. Never received appointment to GI -not pleased that Omeprazole increased to 40 mg on last visit.  20 mg worked just fine. He denies any further abdominal pain or bloating.  4. Knee pain -locking up when he squats down and feel like they want to give out when he first stands up from a seated position No stiffness or swelling.  Had recent x-rays of both knees. I went over the results with him. Has subtle lateral meniscal chondrocalcinosis of the left knee. Has slight lateral patellar subluxation of the right knee  5. Chronic lower back pain -Laminectomy to L4-L5 20 years ago. -Complains of stiffness and tired feeling in his back.   -No radiation of pain down the legs. no numbness or tingling. No weakness in legs only knees.   Neck and shoulders hurt sometimes. Had disc fusion in neck many years ago -taking Aleve 220 BID. Denies any stomach upset with this dose. States he cannot take ibuprofen because it upsets his stomach  6.  Complaints of having jock itch  Patient Active Problem List   Diagnosis Date Noted  . Varicella-zoster infection 04/12/2016  . Type 2 diabetes mellitus without complication, without long-term current use of insulin  ([place]) 04/12/2016  . Essential hypertension 04/12/2016  . Rash 04/07/2016     Current Outpatient Prescriptions on File Prior to Visit  Medication Sig Dispense Refill  . acetaminophen (TYLENOL) 500 MG tablet Take 500 mg by mouth 2 (two) times daily.    Marland Kitchen atorvastatin (LIPITOR) 40 MG tablet Take 1 tablet (40 mg total) by mouth daily. 90 tablet 3  . Blood Glucose Monitoring Suppl (TRUE METRIX METER) w/Device KIT 1 each by Does not apply route 4 (four) times daily -  before meals and at bedtime. 1 kit 0  . diphenhydrAMINE (BENADRYL) 25 MG tablet Take 25 mg by mouth every 6 (six) hours as needed for itching or allergies. Reported on 04/12/2016    . glucose blood (TRUE METRIX BLOOD GLUCOSE TEST) test strip Use as instructed 100 each 12  . glucose blood test strip Use as instructed 100 each 12  . glucose monitoring kit (FREESTYLE) monitoring kit 1 each by Does not apply route as needed for other. Test glucose twice daily 1 each 0  . lisinopril-hydrochlorothiazide (PRINZIDE,ZESTORETIC) 10-12.5 MG tablet Take 1 tablet by mouth daily. 90 tablet 3  . loratadine (CLARITIN) 10 MG tablet Take 1 tablet (10 mg total) by mouth daily. 90 tablet 0  . metFORMIN (GLUCOPHAGE) 1000 MG tablet Take 1 tablet (1,000 mg total) by mouth 2 (two) times daily with a meal. 180 tablet 3  . naproxen sodium (ANAPROX) 220 MG tablet Take 440 mg by mouth 2 (two)  times daily with a meal.    . OVER THE COUNTER MEDICATION Take 2 tablets by mouth at bedtime. "RESTLESS LEGS OTC"    . potassium chloride (K-DUR) 10 MEQ tablet Take 1 tablet (10 mEq total) by mouth daily. 30 tablet 2  . sildenafil (VIAGRA) 50 MG tablet Take 1 tablet (50 mg total) by mouth daily as needed for erectile dysfunction. Trial 1/2 tab first to see if enough 30 tablet 0  . tamsulosin (FLOMAX) 0.4 MG CAPS capsule Take 1 capsule (0.4 mg total) by mouth daily. 90 capsule 3  . TRUEPLUS LANCETS 28G MISC 1 each by Does not apply route 4 (four) times daily -  before meals and  at bedtime. 100 each 12   No current facility-administered medications on file prior to visit.     No Known Allergies  Social History   Social History  . Marital status: Legally Separated    Spouse name: N/A  . Number of children: N/A  . Years of education: N/A   Occupational History  . general contractor    Social History Main Topics  . Smoking status: Former Smoker    Quit date: 10/16/1988  . Smokeless tobacco: Never Used  . Alcohol use No  . Drug use: No  . Sexual activity: Not on file   Other Topics Concern  . Not on file   Social History Narrative   Volunteers as Airline pilot    Family History  Problem Relation Age of Onset  . Pancreatic cancer Mother   . CAD Father     Past Surgical History:  Procedure Laterality Date  . BACK SURGERY  1993   lumbar  . CERVICAL SPINE SURGERY  2000  . HAND SURGERY      ROS: Review of Systems  PHYSICAL EXAM: BP (!) 146/81   Pulse 81   Temp 98.2 F (36.8 C) (Oral)   Resp 16   Wt 233 lb (105.7 kg)   SpO2 96%   BMI 35.95 kg/m   Wt Readings from Last 3 Encounters:  04/13/17 233 lb (105.7 kg)  12/25/16 235 lb 9.6 oz (106.9 kg)  10/17/16 239 lb (108.4 kg)    Physical Exam  General appearance - alert, well appearing, middle-age Caucasian male and in no distress Mental status - alert, oriented to person, place, and time, normal mood, behavior, speech, dress, motor activity, and thought processes Eyes - pupils equal and reactive, extraocular eye movements intact Mouth - mucous membranes moist, pharynx normal without lesions Neck - supple, no significant adenopathy Chest - clear to auscultation, no wheezes, rales or rhonchi, symmetric air entry Heart - normal rate, regular rhythm, normal S1, S2, no murmurs, rubs, clicks or gallops Musculoskeletal -knees: No significant joint enlargement. No point tenderness. Good range of motion.  Lumbar spine: No tenderness on palpation. Extremities -no lower extremity edema. Skin  - slight erythematous rash upper inner thighs  Lab Results  Component Value Date   HGBA1C 7.4 10/17/2016   Depression screen [facility] 2/9 12/25/2016 10/17/2016 06/02/2016 04/12/2016 04/12/2016  Decreased Interest 0 0 0 0 0  Down, Depressed, Hopeless 0 0 0 0 0  PHQ - 2 Score 0 0 0 0 0  Altered sleeping 0 0 - 0 -  Tired, decreased energy 3 0 - 2 -  Change in appetite 0 0 - 2 -  Feeling bad or failure about yourself  0 0 - 1 -  Trouble concentrating 0 0 - 0 -  Moving slowly or fidgety/restless 0  0 - 0 -  Suicidal thoughts 0 0 - 0 -  PHQ-9 Score 3 0 - 5 -    ASSESSMENT AND PLAN: 1. Type 2 diabetes mellitus without complication, without long-term current use of insulin ([place]) -Reported blood sugar levels are okay. Discontinue glipizide due to hypoglycemia. Continue metformin  2. Essential hypertension -Continue lisinopril/HCTZ. Continue DASH diet  3. Colon cancer screening - Ambulatory referral to Gastroenterology  4. Chronic pain of both knees 5. Chronic bilateral low back pain without sciatica -Patient advised that daily use of NSAIDs can sometimes affect the kidneys especially in patients with diabetes. He is willing to try Voltaren gel and a low dose of Cymbalta for his chronic pains. -He declines referral to orthopedics or to a spine surgeon. - diclofenac sodium (VOLTAREN) 1 % GEL; Apply 2 g topically 4 (four) times daily.  Dispense: 100 g; Refill: 2 - DULoxetine (CYMBALTA) 20 MG capsule; Take 1 capsule (20 mg total) by mouth daily.  Dispense: 30 capsule; Refill: 3  6. Gastroesophageal reflux disease without esophagitis - omeprazole (PRILOSEC) 20 MG capsule; Take 1 capsule (20 mg total) by mouth daily.  Dispense: 30 capsule; Refill: 3  7. Tinea cruris - miconazole (MICOTIN) 2 % cream; Apply 1 application topically 2 (two) times daily.  Dispense: 28.35 g; Refill: 0  Patient was given the opportunity to ask questions.  Patient verbalized understanding of the plan and was able to repeat  key elements of the plan.   Orders Placed This Encounter  Procedures  . Ambulatory referral to Gastroenterology     Requested Prescriptions   Signed Prescriptions Disp Refills  . omeprazole (PRILOSEC) 20 MG capsule 30 capsule 3    Sig: Take 1 capsule (20 mg total) by mouth daily.  . diclofenac sodium (VOLTAREN) 1 % GEL 100 g 2    Sig: Apply 2 g topically 4 (four) times daily.  . miconazole (MICOTIN) 2 % cream 28.35 g 0    Sig: Apply 1 application topically 2 (two) times daily.  . DULoxetine (CYMBALTA) 20 MG capsule 30 capsule 3    Sig: Take 1 capsule (20 mg total) by mouth daily.    Return in about 3 months (around 07/14/2017).  Karle Plumber, MD, FACP

## 2017-04-13 NOTE — Patient Instructions (Signed)
Stop Glucotrol.  Continue Metformin. Try using the Voltarin gel and Cymbalta for pain.  Try to cut back on daily use of Aleve.   Duloxetine delayed-release capsules What is this medicine? DULOXETINE (doo LOX e teen) is used to treat depression, anxiety, and different types of chronic pain. This medicine may be used for other purposes; ask your health care provider or pharmacist if you have questions. COMMON BRAND NAME(S): Cymbalta, Irenka What should I tell my health care provider before I take this medicine? They need to know if you have any of these conditions: -bipolar disorder or a family history of bipolar disorder -glaucoma -kidney disease -liver disease -suicidal thoughts or a previous suicide attempt -taken medicines called MAOIs like Carbex, Eldepryl, Marplan, Nardil, and Parnate within 14 days -an unusual reaction to duloxetine, other medicines, foods, dyes, or preservatives -pregnant or trying to get pregnant -breast-feeding How should I use this medicine? Take this medicine by mouth with a glass of water. Follow the directions on the prescription label. Do not cut, crush or chew this medicine. You can take this medicine with or without food. Take your medicine at regular intervals. Do not take your medicine more often than directed. Do not stop taking this medicine suddenly except upon the advice of your doctor. Stopping this medicine too quickly may cause serious side effects or your condition may worsen. A special MedGuide will be given to you by the pharmacist with each prescription and refill. Be sure to read this information carefully each time. Talk to your pediatrician regarding the use of this medicine in children. While this drug may be prescribed for children as young as 587 years of age for selected conditions, precautions do apply. Overdosage: If you think you have taken too much of this medicine contact a poison control center or emergency room at once. NOTE: This  medicine is only for you. Do not share this medicine with others. What if I miss a dose? If you miss a dose, take it as soon as you can. If it is almost time for your next dose, take only that dose. Do not take double or extra doses. What may interact with this medicine? Do not take this medicine with any of the following medications: -desvenlafaxine -levomilnacipran -linezolid -MAOIs like Carbex, Eldepryl, Marplan, Nardil, and Parnate -methylene blue (injected into a vein) -milnacipran -thioridazine -venlafaxine This medicine may also interact with the following medications: -alcohol -amphetamines -aspirin and aspirin-like medicines -certain antibiotics like ciprofloxacin and enoxacin -certain medicines for blood pressure, heart disease, irregular heart beat -certain medicines for depression, anxiety, or psychotic disturbances -certain medicines for migraine headache like almotriptan, eletriptan, frovatriptan, naratriptan, rizatriptan, sumatriptan, zolmitriptan -certain medicines that treat or prevent blood clots like warfarin, enoxaparin, and dalteparin -cimetidine -fentanyl -lithium -NSAIDS, medicines for pain and inflammation, like ibuprofen or naproxen -phentermine -procarbazine -rasagiline -sibutramine -St. John's wort -theophylline -tramadol -tryptophan This list may not describe all possible interactions. Give your health care provider a list of all the medicines, herbs, non-prescription drugs, or dietary supplements you use. Also tell them if you smoke, drink alcohol, or use illegal drugs. Some items may interact with your medicine. What should I watch for while using this medicine? Tell your doctor if your symptoms do not get better or if they get worse. Visit your doctor or health care professional for regular checks on your progress. Because it may take several weeks to see the full effects of this medicine, it is important to continue your treatment as prescribed by  your doctor. Patients and their families should watch out for new or worsening thoughts of suicide or depression. Also watch out for sudden changes in feelings such as feeling anxious, agitated, panicky, irritable, hostile, aggressive, impulsive, severely restless, overly excited and hyperactive, or not being able to sleep. If this happens, especially at the beginning of treatment or after a change in dose, call your health care professional. Bonita Quin may get drowsy or dizzy. Do not drive, use machinery, or do anything that needs mental alertness until you know how this medicine affects you. Do not stand or sit up quickly, especially if you are an older patient. This reduces the risk of dizzy or fainting spells. Alcohol may interfere with the effect of this medicine. Avoid alcoholic drinks. This medicine can cause an increase in blood pressure. This medicine can also cause a sudden drop in your blood pressure, which may make you feel faint and increase the chance of a fall. These effects are most common when you first start the medicine or when the dose is increased, or during use of other medicines that can cause a sudden drop in blood pressure. Check with your doctor for instructions on monitoring your blood pressure while taking this medicine. Your mouth may get dry. Chewing sugarless gum or sucking hard candy, and drinking plenty of water may help. Contact your doctor if the problem does not go away or is severe. What side effects may I notice from receiving this medicine? Side effects that you should report to your doctor or health care professional as soon as possible: -allergic reactions like skin rash, itching or hives, swelling of the face, lips, or tongue -anxious -breathing problems -confusion -changes in vision -chest pain -confusion -elevated mood, decreased need for sleep, racing thoughts, impulsive behavior -eye pain -fast, irregular heartbeat -feeling faint or lightheaded, falls -feeling  agitated, angry, or irritable -hallucination, loss of contact with reality -high blood pressure -loss of balance or coordination -palpitations -redness, blistering, peeling or loosening of the skin, including inside the mouth -restlessness, pacing, inability to keep still -seizures -stiff muscles -suicidal thoughts or other mood changes -trouble passing urine or change in the amount of urine -trouble sleeping -unusual bleeding or bruising -unusually weak or tired -vomiting -yellowing of the eyes or skin Side effects that usually do not require medical attention (report to your doctor or health care professional if they continue or are bothersome): -change in sex drive or performance -change in appetite or weight -constipation -dizziness -dry mouth -headache -increased sweating -nausea -tired This list may not describe all possible side effects. Call your doctor for medical advice about side effects. You may report side effects to FDA at 1-800-FDA-1088. Where should I keep my medicine? Keep out of the reach of children. Store at room temperature between 20 and 25 degrees C (68 to 77 degrees F). Throw away any unused medicine after the expiration date. NOTE: This sheet is a summary. It may not cover all possible information. If you have questions about this medicine, talk to your doctor, pharmacist, or health care provider.  2018 Elsevier/Gold Standard (2016-03-02 18:16:03)

## 2017-04-16 ENCOUNTER — Encounter: Payer: Self-pay | Admitting: Gastroenterology

## 2017-04-16 MED FILL — VOLTAREN 1% GEL: 1 | 12 days supply | Qty: 100 | Fill #0

## 2017-04-16 MED FILL — DULoxetine HCL 20 MG CPEP: 20 | 30 days supply | Qty: 30 | Fill #0

## 2017-04-24 ENCOUNTER — Telehealth: Payer: Self-pay | Admitting: Internal Medicine

## 2017-04-24 MED FILL — ?OMEPRAZOLE DR 20 MG CAPSUL: 20 | 30 days supply | Qty: 30 | Fill #0

## 2017-04-24 NOTE — Telephone Encounter (Signed)
Pt's finace called stating that the miconazole that he has been prescribed is not working and would like to know if there is anything stronger that may be prescribed. Please f/u. Thank you.

## 2017-04-26 MED ORDER — FLUCONAZOLE 150 MG PO TABS: ORAL_TABLET | ORAL | 0 refills | Status: DC

## 2017-04-26 MED FILL — FLUCONAZOLE 150 MG TABLET: 150 | 21 days supply | Qty: 3 | Fill #0

## 2017-04-26 NOTE — Telephone Encounter (Signed)
Contacted pt to make aware about the rx being sent to the pharmacy pt states he will come pick it up

## 2017-04-27 ENCOUNTER — Encounter: Payer: Self-pay | Admitting: Internal Medicine

## 2017-04-30 MED FILL — TAMSULOSIN HCL 0.4 MG CAP: 0.4 | 30 days supply | Qty: 30 | Fill #0

## 2017-04-30 MED FILL — ?METFORMIN HCL 1,000 MG TAB: 1000 | 30 days supply | Qty: 60 | Fill #5

## 2017-04-30 MED FILL — ?ATORVASTATIN 40MG TABLET: 40 | 30 days supply | Qty: 30 | Fill #3

## 2017-05-18 MED FILL — glyBURIDE 5 MG TABS: 5 | 30 days supply | Qty: 30 | Fill #2

## 2017-05-18 MED FILL — LISINOPRIL-HCTZ 10-12.5 MG: 10-12.5 | 30 days supply | Qty: 30 | Fill #4

## 2017-05-18 MED FILL — ?OMEPRAZOLE DR 20 MG CAPSUL: 20 | 30 days supply | Qty: 30 | Fill #1

## 2017-05-18 MED FILL — DULoxetine HCL 20 MG CPEP: 20 | 30 days supply | Qty: 30 | Fill #1

## 2017-06-03 ENCOUNTER — Encounter: Payer: Self-pay | Admitting: Internal Medicine

## 2017-06-03 DIAGNOSIS — M25562 Pain in left knee: Secondary | ICD-10-CM

## 2017-06-03 DIAGNOSIS — M545 Low back pain: Secondary | ICD-10-CM

## 2017-06-03 DIAGNOSIS — K219 Gastro-esophageal reflux disease without esophagitis: Secondary | ICD-10-CM | POA: Insufficient documentation

## 2017-06-03 DIAGNOSIS — G8929 Other chronic pain: Secondary | ICD-10-CM | POA: Insufficient documentation

## 2017-06-03 DIAGNOSIS — M25561 Pain in right knee: Secondary | ICD-10-CM

## 2017-06-04 ENCOUNTER — Telehealth: Payer: Self-pay | Admitting: Internal Medicine

## 2017-06-04 ENCOUNTER — Ambulatory Visit: Payer: Self-pay | Attending: Internal Medicine | Admitting: Internal Medicine

## 2017-06-04 ENCOUNTER — Encounter: Payer: Self-pay | Admitting: Internal Medicine

## 2017-06-04 VITALS — BP 125/80 | HR 84 | Temp 98.6°F | Resp 16 | Wt 226.0 lb

## 2017-06-04 DIAGNOSIS — Z7984 Long term (current) use of oral hypoglycemic drugs: Secondary | ICD-10-CM | POA: Insufficient documentation

## 2017-06-04 DIAGNOSIS — F5232 Male orgasmic disorder: Secondary | ICD-10-CM | POA: Insufficient documentation

## 2017-06-04 DIAGNOSIS — G8929 Other chronic pain: Secondary | ICD-10-CM | POA: Insufficient documentation

## 2017-06-04 DIAGNOSIS — I10 Essential (primary) hypertension: Secondary | ICD-10-CM | POA: Insufficient documentation

## 2017-06-04 DIAGNOSIS — M545 Low back pain, unspecified: Secondary | ICD-10-CM

## 2017-06-04 DIAGNOSIS — E119 Type 2 diabetes mellitus without complications: Secondary | ICD-10-CM | POA: Insufficient documentation

## 2017-06-04 DIAGNOSIS — Z79899 Other long term (current) drug therapy: Secondary | ICD-10-CM | POA: Insufficient documentation

## 2017-06-04 DIAGNOSIS — K219 Gastro-esophageal reflux disease without esophagitis: Secondary | ICD-10-CM | POA: Insufficient documentation

## 2017-06-04 NOTE — Progress Notes (Signed)
Patient ID: Jeremy [name], male    DOB: [birth year]  MRN: [id number]  CC: unable to climax  Subjective:  Jeremy [name] is a 56 y.o. male who presents for UC visit. His fianc Lattie Haw is with him. His concerns today include:  1.  Has problem climaxing during sexual intercourse x 1 mth.  -His fianc noticed that it started around the time that he started Cymbalta given on last visit for chronic pain. Stop Cymbalta this past wkend and has not had intercourse since stopping the medication -Denies any stress in his current relationship. They have been together for 5 years.  2.  Now taking Aleve 220 ans Tylenol 500 mg BID for chronic pain of the knees and lower back. -Did not find the Cymbalta to be helpful. -Did not find Voltaren gel to be helpful but when he did use it, he applied it about once a day  Patient Active Problem List   Diagnosis Date Noted  . GERD (gastroesophageal reflux disease) 06/03/2017  . Chronic pain of both knees 06/03/2017  . Chronic lower back pain 06/03/2017  . Type 2 diabetes mellitus without complication, without long-term current use of insulin ([place]) 04/12/2016  . Essential hypertension 04/12/2016     Current Outpatient Prescriptions on File Prior to Visit  Medication Sig Dispense Refill  . acetaminophen (TYLENOL) 500 MG tablet Take 500 mg by mouth 2 (two) times daily.    Marland Kitchen atorvastatin (LIPITOR) 40 MG tablet Take 1 tablet (40 mg total) by mouth daily. 90 tablet 3  . Blood Glucose Monitoring Suppl (TRUE METRIX METER) w/Device KIT 1 each by Does not apply route 4 (four) times daily -  before meals and at bedtime. 1 kit 0  . diclofenac sodium (VOLTAREN) 1 % GEL Apply 2 g topically 4 (four) times daily. 100 g 2  . diphenhydrAMINE (BENADRYL) 25 MG tablet Take 25 mg by mouth every 6 (six) hours as needed for itching or allergies. Reported on 04/12/2016    . fluconazole (DIFLUCAN) 150 MG tablet 1 tab PO Q week. 3 tablet 0  . glucose blood (TRUE METRIX BLOOD GLUCOSE  TEST) test strip Use as instructed 100 each 12  . glucose blood test strip Use as instructed 100 each 12  . glucose monitoring kit (FREESTYLE) monitoring kit 1 each by Does not apply route as needed for other. Test glucose twice daily 1 each 0  . lisinopril-hydrochlorothiazide (PRINZIDE,ZESTORETIC) 10-12.5 MG tablet Take 1 tablet by mouth daily. 90 tablet 3  . loratadine (CLARITIN) 10 MG tablet Take 1 tablet (10 mg total) by mouth daily. 90 tablet 0  . metFORMIN (GLUCOPHAGE) 1000 MG tablet Take 1 tablet (1,000 mg total) by mouth 2 (two) times daily with a meal. 180 tablet 3  . miconazole (MICOTIN) 2 % cream Apply 1 application topically 2 (two) times daily. 28.35 g 0  . naproxen sodium (ANAPROX) 220 MG tablet Take 440 mg by mouth 2 (two) times daily with a meal.    . omeprazole (PRILOSEC) 20 MG capsule Take 1 capsule (20 mg total) by mouth daily. 30 capsule 3  . OVER THE COUNTER MEDICATION Take 2 tablets by mouth at bedtime. "RESTLESS LEGS OTC"    . potassium chloride (K-DUR) 10 MEQ tablet Take 1 tablet (10 mEq total) by mouth daily. 30 tablet 2  . sildenafil (VIAGRA) 50 MG tablet Take 1 tablet (50 mg total) by mouth daily as needed for erectile dysfunction. Trial 1/2 tab first to see if enough 30 tablet  0  . tamsulosin (FLOMAX) 0.4 MG CAPS capsule Take 1 capsule (0.4 mg total) by mouth daily. 90 capsule 3  . TRUEPLUS LANCETS 28G MISC 1 each by Does not apply route 4 (four) times daily -  before meals and at bedtime. 100 each 12   No current facility-administered medications on file prior to visit.     Allergies  Allergen Reactions  . 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 -Discontinue Cymbalta. If he is still experiencing problem with anorgasmia he may try using the Viagra that was prescribed to him by previous PCP.  2. Chronic bilateral low back pain without sciatica -Advised patient that he can use the Voltaren gel up to 4 times a day. -Last creatinine was 0.76. Okay to use the Aleve and Tylenol BID since this brings relief..   Patient was given the opportunity to ask questions.  Patient verbalized understanding of the plan and was able to repeat key elements of the plan.   No orders of the defined types were placed in this encounter.    Requested Prescriptions    No prescriptions requested or ordered in this encounter    Future Appointments Date Time Provider [place]  06/11/2017 4:30 PM [facility] PRE[facility]  06/25/2017 11:00 AM Armbruster, Renelda Loma, MD [facility]    Karle Plumber, MD, FACP

## 2017-06-04 NOTE — Telephone Encounter (Signed)
Pt came to the office to request that can you please correct the pharmacy on Jeremy [name] MRN [id number], he want only [facility] pharmacy please

## 2017-06-04 NOTE — Patient Instructions (Signed)
Stop Cymbalta. If after 2 weeks you are still unable to have an orgasm, then you can try the Viagra as needed.

## 2017-06-04 NOTE — Telephone Encounter (Signed)
It done

## 2017-06-11 ENCOUNTER — Ambulatory Visit (AMBULATORY_SURGERY_CENTER): Payer: Self-pay | Admitting: *Deleted

## 2017-06-11 VITALS — Ht 68.0 in | Wt 228.0 lb

## 2017-06-11 DIAGNOSIS — Z1211 Encounter for screening for malignant neoplasm of colon: Secondary | ICD-10-CM

## 2017-06-11 MED ORDER — NA SULFATE-K SULFATE-MG SULF 17.5-3.13-1.6 GM/177ML PO SOLN: ORAL | 0 refills | Status: DC

## 2017-06-11 NOTE — Progress Notes (Signed)
Patient denies any allergies to eggs or soy. Patient denies any problems with anesthesia/sedation. Patient denies any oxygen use at home and does not take any diet/weight loss medications. EMMI education assisgned to patient on colonoscopy, this was explained and instructions given to patient. Patient has [place] card, Suprep sample given to patient.

## 2017-06-11 NOTE — Progress Notes (Signed)
After patient read the entire consent and asked many questions he states "forget it!" and got up and walked out of my pre-visit office leaving his wife in chair in my office. I tried to explain that we do not tape the procedure but do take pictures. I then notified wife that his procedure will be cancelled and he may call back to reschedule this. She verbalizes understanding. Suprep was NOT given to patient at this time since he did not stay for instructions.

## 2017-06-12 ENCOUNTER — Encounter: Payer: Self-pay | Admitting: Internal Medicine

## 2017-06-13 ENCOUNTER — Encounter: Payer: Self-pay | Admitting: Internal Medicine

## 2017-06-19 MED FILL — ?OMEPRAZOLE DR 20 MG CAPSUL: 20 | 30 days supply | Qty: 30 | Fill #2

## 2017-06-21 MED FILL — ?TAMSULOSIN HCL 0.4 MG CAP: 0.4 | 30 days supply | Qty: 30 | Fill #1

## 2017-06-21 MED FILL — LISINOPRIL-HCTZ 10-12.5 MG: 10-12.5 | 30 days supply | Qty: 30 | Fill #5

## 2017-06-21 MED FILL — glyBURIDE 5 MG TABS: 5 | 30 days supply | Qty: 30 | Fill #3

## 2017-06-21 MED FILL — ?ATORVASTATIN 40MG TABLET: 40 | 30 days supply | Qty: 30 | Fill #4

## 2017-06-21 MED FILL — ?METFORMIN HCL 1,000 MG TAB: 1000 | 30 days supply | Qty: 60 | Fill #6

## 2017-06-21 MED FILL — DULoxetine HCL 20 MG CPEP: 20 | 30 days supply | Qty: 30 | Fill #2

## 2017-06-25 ENCOUNTER — Encounter: Payer: Self-pay | Admitting: Gastroenterology

## 2017-07-16 MED FILL — ?OMEPRAZOLE DR 20 MG CAPSUL: 20 | 30 days supply | Qty: 30 | Fill #3

## 2017-07-16 MED FILL — ?METFORMIN HCL 1,000 MG TAB: 1000 | 30 days supply | Qty: 60 | Fill #7

## 2017-07-18 MED FILL — ?ATORVASTATIN 40MG TABLET: 40 | 30 days supply | Qty: 30 | Fill #5

## 2017-07-18 MED FILL — ?TAMSULOSIN HCL 0.4 MG CAP: 0.4 | 30 days supply | Qty: 30 | Fill #2

## 2017-08-01 MED FILL — LISINOPRIL-HCTZ 10-12.5 MG: 10-12.5 | 30 days supply | Qty: 30 | Fill #6

## 2017-08-10 MED FILL — ?METFORMIN HCL 1,000 MG TAB: 1000 | 30 days supply | Qty: 60 | Fill #8

## 2017-08-13 ENCOUNTER — Telehealth: Payer: Self-pay | Admitting: Internal Medicine

## 2017-08-13 MED FILL — ?TAMSULOSIN HCL 0.4 MG CAP: 0.4 | 30 days supply | Qty: 30 | Fill #3

## 2017-08-13 MED FILL — ?ATORVASTATIN 40MG TABLET: 40 | 30 days supply | Qty: 30 | Fill #6

## 2017-08-13 MED FILL — ?OMEPRAZOLE DR 20 MG CAPSUL: 20 | 15 days supply | Qty: 15 | Fill #1

## 2017-08-13 NOTE — Telephone Encounter (Signed)
Pt called to request a refill for glyBURIDE (DIABETA) 5 MG tablet   Please sent it to [facility], please follow up

## 2017-08-14 NOTE — Telephone Encounter (Signed)
I think was was discontinued at the last visit due to hypoglycemia. Will forward to Dr. Laural BenesJohnson.

## 2017-08-15 ENCOUNTER — Ambulatory Visit: Payer: Self-pay | Attending: Internal Medicine

## 2017-08-16 ENCOUNTER — Telehealth: Payer: Self-pay | Admitting: Internal Medicine

## 2017-08-16 NOTE — Telephone Encounter (Signed)
Pt fiance call to speak with the nurse about his Glyburide Since no one call him, please call and have a talk with him

## 2017-08-17 NOTE — Telephone Encounter (Signed)
Returned pt to inform him of Dr. Laural BenesJohnson response. Pt states he understands and doesn't have any questions or concerns

## 2017-08-17 NOTE — Telephone Encounter (Signed)
Will forward to pcp

## 2017-08-17 NOTE — Telephone Encounter (Signed)
Pt states he hasn't been checking his sugar. Pt states he doesn't remember having that discussion about beening taken off the Glyburide. Pt states he hasn't taken the medication for almost 2 weeks. Pt states he will check his sugar first and will call us back to tell us his sugar number to even see if he needs the medication

## 2017-08-17 NOTE — Telephone Encounter (Signed)
Pt called back states blood sugar reading was 63.

## 2017-08-21 ENCOUNTER — Telehealth: Payer: Self-pay | Admitting: Internal Medicine

## 2017-08-21 NOTE — Telephone Encounter (Signed)
Pt called to informed you of his Blood sugar level for the last couple of days, they are 163 / 149 / 187 / 192, if you need any more information please call him back

## 2017-08-22 ENCOUNTER — Encounter: Payer: Self-pay | Admitting: Internal Medicine

## 2017-08-22 MED ORDER — GLIPIZIDE 5 MG PO TABS
2.5000 mg | ORAL_TABLET | Freq: Two times a day (BID) | ORAL | 6 refills | Status: DC
Start: 2017-08-22 — End: 2017-11-05

## 2017-08-22 MED FILL — glipiZIDE 5 MG TABS: 5 | 30 days supply | Qty: 30 | Fill #0

## 2017-08-22 NOTE — Telephone Encounter (Signed)
I have replied to pt via MyChart: I received your message regarding your blood sugar readings. Are these morning blood sugar readings? The goal for morning blood sugars before meal is 90-130. We should add a low dose of a diabetic medication called Glucotrol, similar to what you were taking.  I have sent the prescription to our pharmacy.

## 2017-08-30 ENCOUNTER — Other Ambulatory Visit: Payer: Self-pay | Admitting: *Deleted

## 2017-08-30 DIAGNOSIS — K219 Gastro-esophageal reflux disease without esophagitis: Secondary | ICD-10-CM

## 2017-08-30 MED ORDER — OMEPRAZOLE 20 MG PO CPDR: 20.0000 mg | DELAYED_RELEASE_CAPSULE | Freq: Every day | ORAL | 5 refills | Status: DC

## 2017-09-11 MED FILL — ?OMEPRAZOLE DR 20 MG CAPSUL: 20 | 30 days supply | Qty: 30 | Fill #0

## 2017-09-12 MED FILL — LISINOPRIL-HCTZ 10-12.5 MG: 10-12.5 | 30 days supply | Qty: 30 | Fill #7

## 2017-09-17 MED FILL — ?ATORVASTATIN 40MG TABLET: 40 | 30 days supply | Qty: 30 | Fill #7

## 2017-09-17 MED FILL — ?TAMSULOSIN HCL 0.4 MG CAP: 0.4 | 30 days supply | Qty: 30 | Fill #1

## 2017-09-17 MED FILL — ?METFORMIN HCL 1,000 MG TAB: 1000 | 30 days supply | Qty: 60 | Fill #9

## 2017-09-24 MED FILL — ?GLIPIZIDE 5MG TABLET: 5 | 30 days supply | Qty: 30 | Fill #1

## 2017-10-11 MED FILL — LISINOPRIL-HCTZ 10-12.5 MG: 10-12.5 | 30 days supply | Qty: 30 | Fill #8

## 2017-10-20 ENCOUNTER — Other Ambulatory Visit: Payer: Self-pay

## 2017-10-20 ENCOUNTER — Ambulatory Visit (HOSPITAL_COMMUNITY)
Admission: EM | Admit: 2017-10-20 | Discharge: 2017-10-20 | Disposition: A | Discharge status: Home / Self Care | Payer: Self-pay | Attending: Family Medicine | Admitting: Family Medicine

## 2017-10-20 ENCOUNTER — Encounter (HOSPITAL_COMMUNITY): Payer: Self-pay | Admitting: Emergency Medicine

## 2017-10-20 DIAGNOSIS — R059 Cough, unspecified: Secondary | ICD-10-CM

## 2017-10-20 DIAGNOSIS — R05 Cough: Secondary | ICD-10-CM

## 2017-10-20 MED ORDER — HYDROCODONE-HOMATROPINE 5-1.5 MG/5ML PO SYRP: 5.0000 mL | ORAL_SOLUTION | Freq: Four times a day (QID) | ORAL | 0 refills | Status: DC | PRN

## 2017-10-20 MED ORDER — AZITHROMYCIN 250 MG PO TABS: 250.0000 mg | ORAL_TABLET | Freq: Every day | ORAL | 0 refills | Status: DC

## 2017-10-20 NOTE — ED Triage Notes (Signed)
Pt c/o coughing x1 week.

## 2017-10-20 NOTE — Discharge Instructions (Signed)
Be aware, your cough medication may cause drowsiness. Please do not drive, operate heavy machinery or make important decisions while on this medication, it can cloud your judgement.  

## 2017-10-22 MED FILL — ?METFORMIN HCL 1,000 MG TAB: 1000 | 30 days supply | Qty: 60 | Fill #0

## 2017-10-22 MED FILL — ?ATORVASTATIN 40MG TABLET: 40 | 30 days supply | Qty: 30 | Fill #8

## 2017-10-22 MED FILL — ?TAMSULOSIN HCL 0.4 MG CAP: 0.4 | 30 days supply | Qty: 30 | Fill #2

## 2017-10-22 MED FILL — ?OMEPRAZOLE DR 20MG CAPSULE: 20 | 30 days supply | Qty: 30 | Fill #1

## 2017-10-22 NOTE — ED Provider Notes (Signed)
[facility]   [id number] 10/20/17 Arrival Time: 1912  ASSESSMENT & PLAN:  1. Cough    Meds ordered this encounter  Medications  . HYDROcodone-homatropine (HYCODAN) 5-1.5 MG/5ML syrup    Sig: Take 5 mLs by mouth every 6 (six) hours as needed for cough.    Dispense:  90 mL    Refill:  0  . azithromycin (ZITHROMAX) 250 MG tablet    Sig: Take 1 tablet (250 mg total) by mouth daily. Take first 2 tablets together, then 1 every day until finished.    Dispense:  6 tablet    Refill:  0   Medication sedation precautions. OTC symptom care as needed. Ensure adequate fluid intake and rest. May f/u with PCP or here as needed.  Reviewed expectations re: course of current medical issues. Questions answered. Outlined signs and symptoms indicating need for more acute intervention. Patient verbalized understanding. After Visit Summary given.   SUBJECTIVE: History from: patient.  Jeremy [name] is a 57 y.o. male who presents with complaint a persistent dry cough with mild nasal congestion. Onset abrupt, approximately 1 week ago. Some fatigue. SOB: none. Wheezing: none. Fever: no. Overall normal PO intake without n/v. Sick contacts: no. No specific aggravating or alleviating factors reported. OTC treatment: Cough medication without relief.  Social History   Tobacco Use  Smoking Status Former Smoker  . Last attempt to quit: 10/16/1988  . Years since quitting: 29.0  Smokeless Tobacco Never Used    ROS: As per HPI.   OBJECTIVE:  Vitals:   10/20/17 1942  BP: 140/61  Pulse: 86  Resp: 16  Temp: 98.1 F (36.7 C)  SpO2: 97%     General appearance: alert; appears fatigued HEENT: nasal congestion; clear runny nose; throat irritation secondary to post-nasal drainage Neck: supple without LAD Lungs: unlabored respirations, symmetrical air entry; cough: moderate; no respiratory distress Skin: warm and dry Psychological: alert and cooperative; normal mood and  affect   Allergies  Allergen Reactions  . Cymbalta [Duloxetine Hcl] Other (See Comments)    Caused anorgasmia    Past Medical History:  Diagnosis Date  . Diabetes mellitus without complication (HCC)   . GERD (gastroesophageal reflux disease)   . Hyperlipidemia   . Hypertension   . Rash of entire body 03/2016  . Sleep apnea    NO CPAP per pt   Family History  Problem Relation Age of Onset  . Pancreatic cancer Mother   . CAD Father   . Colon cancer Paternal Grandfather    Social History   Socioeconomic History  . Marital status: Legally Separated    Spouse name: Not on file  . Number of children: Not on file  . Years of education: Not on file  . Highest education level: Not on file  Social Needs  . Financial resource strain: Not on file  . Food insecurity - worry: Not on file  . Food insecurity - inability: Not on file  . Transportation needs - medical: Not on file  . Transportation needs - non-medical: Not on file  Occupational History  . Occupation: Product/process development scientistgeneral contractor  Tobacco Use  . Smoking status: Former Smoker    Last attempt to quit: 10/16/1988    Years since quitting: 29.0  . Smokeless tobacco: Never Used  Substance and Sexual Activity  . Alcohol use: No  . Drug use: No  . Sexual activity: Not on file  Other Topics Concern  . Not on file  Social History Narrative  Volunteers as firefighter           Mardella Layman, MD 10/22/17 [phone]

## 2017-10-29 MED FILL — ?GLIPIZIDE 5MG TABLET: 5 | 30 days supply | Qty: 30 | Fill #2

## 2017-11-05 ENCOUNTER — Encounter: Payer: Self-pay | Admitting: Internal Medicine

## 2017-11-05 ENCOUNTER — Ambulatory Visit: Payer: Self-pay | Attending: Internal Medicine | Admitting: Internal Medicine

## 2017-11-05 VITALS — BP 133/74 | HR 73 | Temp 98.0°F | Resp 16 | Ht 70.0 in | Wt 235.0 lb

## 2017-11-05 DIAGNOSIS — Z7984 Long term (current) use of oral hypoglycemic drugs: Secondary | ICD-10-CM | POA: Insufficient documentation

## 2017-11-05 DIAGNOSIS — M545 Low back pain: Secondary | ICD-10-CM | POA: Insufficient documentation

## 2017-11-05 DIAGNOSIS — K219 Gastro-esophageal reflux disease without esophagitis: Secondary | ICD-10-CM | POA: Insufficient documentation

## 2017-11-05 DIAGNOSIS — M25552 Pain in left hip: Secondary | ICD-10-CM | POA: Insufficient documentation

## 2017-11-05 DIAGNOSIS — E669 Obesity, unspecified: Secondary | ICD-10-CM | POA: Insufficient documentation

## 2017-11-05 DIAGNOSIS — Z8249 Family history of ischemic heart disease and other diseases of the circulatory system: Secondary | ICD-10-CM | POA: Insufficient documentation

## 2017-11-05 DIAGNOSIS — G8929 Other chronic pain: Secondary | ICD-10-CM | POA: Insufficient documentation

## 2017-11-05 DIAGNOSIS — I10 Essential (primary) hypertension: Secondary | ICD-10-CM | POA: Insufficient documentation

## 2017-11-05 DIAGNOSIS — M79605 Pain in left leg: Secondary | ICD-10-CM | POA: Insufficient documentation

## 2017-11-05 DIAGNOSIS — Z79899 Other long term (current) drug therapy: Secondary | ICD-10-CM | POA: Insufficient documentation

## 2017-11-05 DIAGNOSIS — E119 Type 2 diabetes mellitus without complications: Secondary | ICD-10-CM | POA: Insufficient documentation

## 2017-11-05 DIAGNOSIS — M25562 Pain in left knee: Secondary | ICD-10-CM | POA: Insufficient documentation

## 2017-11-05 DIAGNOSIS — M25561 Pain in right knee: Secondary | ICD-10-CM | POA: Insufficient documentation

## 2017-11-05 DIAGNOSIS — Z1211 Encounter for screening for malignant neoplasm of colon: Secondary | ICD-10-CM | POA: Insufficient documentation

## 2017-11-05 DIAGNOSIS — Z6830 Body mass index (BMI) 30.0-30.9, adult: Secondary | ICD-10-CM | POA: Insufficient documentation

## 2017-11-05 DIAGNOSIS — Z87891 Personal history of nicotine dependence: Secondary | ICD-10-CM | POA: Insufficient documentation

## 2017-11-05 LAB — POCT GLYCOSYLATED HEMOGLOBIN (HGB A1C): Hemoglobin A1C: 7.7

## 2017-11-05 LAB — GLUCOSE, POCT (MANUAL RESULT ENTRY): POC Glucose: 211 mg/dl — AB (ref 70–99)

## 2017-11-05 MED ORDER — GLIPIZIDE 5 MG PO TABS: 5.0000 mg | ORAL_TABLET | Freq: Two times a day (BID) | ORAL | 6 refills | Status: DC

## 2017-11-05 NOTE — Progress Notes (Signed)
Patient ID: Jeremy [name], male    DOB: 24-Sep-1961  MRN: [id number]  CC: No chief complaint on file.   Subjective: Jeremy [name] is a 57 y.o. male who presents for chronic ds management.  His fiance is with him. His concerns today include:  Pt with hx of DM, HL, HTN,GERD, chronic LBP, ED.  1. DM: Checking BS 3-4 x a mth Eating habits: down to 2 meals a day. Loves white carbs and sweet tea.  Saw a dietian several yrs ago and states he was told to eat seafood and lobster which he felt he could not afford. Med: compliant with Metformin and Glucotrol  2. HTN: compliant with lisinopril/HCTZ No chest pains or shortness of breath at rest on exertion.  3.  Obesity: Gained 8 pounds since August of last year. See #1 above for report on eating habits Not getting in much exercise  4. Slow stabbing pain intermittent mainly in LEs - toe, leg, foot . Lasts 5-8 sec.  Sometimes 3-4 x a day, sometimes Q 3-4 days.  Felt like he pulled a muscle in groin LT in Sept 2018.  Starting after stubbing left foot against a tree trunk.  Since then any wrong move aggravates the pain.  He has not noticed any swelling in the groin to suggest hernia  Patient Active Problem List   Diagnosis Date Noted  . GERD (gastroesophageal reflux disease) 06/03/2017  . Chronic pain of both knees 06/03/2017  . Chronic lower back pain 06/03/2017  . Type 2 diabetes mellitus without complication, without long-term current use of insulin ([place]) 04/12/2016  . Essential hypertension 04/12/2016     Current Outpatient Medications on File Prior to Visit  Medication Sig Dispense Refill  . acetaminophen (TYLENOL) 500 MG tablet Take 500 mg by mouth 2 (two) times daily.    Marland Kitchen atorvastatin (LIPITOR) 40 MG tablet Take 1 tablet (40 mg total) by mouth daily. 90 tablet 3  . Blood Glucose Monitoring Suppl (TRUE METRIX METER) w/Device KIT 1 each by Does not apply route 4 (four) times daily -  before meals and at bedtime. 1 kit 0  . diclofenac  sodium (VOLTAREN) 1 % GEL Apply 2 g topically 4 (four) times daily. 100 g 2  . diphenhydrAMINE (BENADRYL) 25 MG tablet Take 25 mg by mouth every 6 (six) hours as needed for itching or allergies. Reported on 04/12/2016    . glucose blood (TRUE METRIX BLOOD GLUCOSE TEST) test strip Use as instructed 100 each 12  . lisinopril-hydrochlorothiazide (PRINZIDE,ZESTORETIC) 10-12.5 MG tablet Take 1 tablet by mouth daily. 90 tablet 3  . loratadine (CLARITIN) 10 MG tablet Take 1 tablet (10 mg total) by mouth daily. 90 tablet 0  . metFORMIN (GLUCOPHAGE) 1000 MG tablet Take 1 tablet (1,000 mg total) by mouth 2 (two) times daily with a meal. 180 tablet 3  . Na Sulfate-K Sulfate-Mg Sulf 17.5-3.13-1.6 GM/180ML SOLN Suprep (no substitutions)-TAKE AS DIRECTED.  Sample of this drug were given to the patient, quantity x 1 kit, Lot Number [id number] , exp.6/20 354 mL 0  . naproxen sodium (ANAPROX) 220 MG tablet Take 440 mg by mouth 2 (two) times daily with a meal.    . omeprazole (PRILOSEC) 20 MG capsule Take 1 capsule (20 mg total) daily by mouth. 30 capsule 5  . OVER THE COUNTER MEDICATION Take 2 tablets by mouth at bedtime. "RESTLESS LEGS OTC"    . potassium chloride (K-DUR) 10 MEQ tablet Take 1 tablet (10 mEq total) by  mouth daily. 30 tablet 2  . tamsulosin (FLOMAX) 0.4 MG CAPS capsule Take 1 capsule (0.4 mg total) by mouth daily. 90 capsule 3   No current facility-administered medications on file prior to visit.     Allergies  Allergen Reactions  . Cymbalta [Duloxetine Hcl] Other (See Comments)    Caused anorgasmia    Social History   Socioeconomic History  . Marital status: Legally Separated    Spouse name: Not on file  . Number of children: Not on file  . Years of education: Not on file  . Highest education level: Not on file  Social Needs  . Financial resource strain: Not on file  . Food insecurity - worry: Not on file  . Food insecurity - inability: Not on file  . Transportation needs - medical: Not  on file  . Transportation needs - non-medical: Not on file  Occupational History  . Occupation: Clinical biochemist  Tobacco Use  . Smoking status: Former Smoker    Last attempt to quit: 10/16/1988    Years since quitting: 29.0  . Smokeless tobacco: Never Used  Substance and Sexual Activity  . Alcohol use: No  . Drug use: No  . Sexual activity: Not on file  Other Topics Concern  . Not on file  Social History Narrative   Volunteers as Airline pilot    Family History  Problem Relation Age of Onset  . Pancreatic cancer Mother   . CAD Father   . Colon cancer Paternal Grandfather     Past Surgical History:  Procedure Laterality Date  . BACK SURGERY  1993   lumbar  . CERVICAL SPINE SURGERY  2000  . HAND SURGERY      ROS: Review of Systems Negative except as stated above PHYSICAL EXAM: BP 133/74   Pulse 73   Temp 98 F (36.7 C) (Oral)   Resp 16   Ht _0  (1.778 m)   Wt 235 lb (106.6 kg)   SpO2 97%   BMI 33.72 kg/m   Wt Readings from Last 3 Encounters:  11/05/17 235 lb (106.6 kg)  06/11/17 228 lb (103.4 kg)  06/04/17 226 lb (102.5 kg)    Physical Exam General appearance - alert, well appearing, middle age caucasian male  and in no distress Mental status - alert, oriented to person, place, and time, normal mood, behavior, speech, dress, motor activity, and thought processes Neck - supple, no significant adenopathy Chest - clear to auscultation, no wheezes, rales or rhonchi, symmetric air entry Heart - normal rate, regular rhythm, normal S1, S2, no murmurs, rubs, clicks or gallops Musculoskeletal - LT groin: No hernia noted.  Good range of motion of the left hip.  Mild discomfort with flexion of the hip Extremities -no lower extremity edema.  Dorsalis pedis, posterior tibialis pulses are normal bilaterally.  Feet warm. Neurologic: Power in lower extremities 5 out of 5 bilaterally.  Sensation intact.  Gait is normal Diabetic Foot Exam - Simple   Simple Foot  Form Visual Inspection No deformities, no ulcerations, no other skin breakdown bilaterally:  Yes Sensation Testing Intact to touch and monofilament testing bilaterally:  Yes Pulse Check Posterior Tibialis and Dorsalis pulse intact bilaterally:  Yes Comments     Lab Results  Component Value Date   HGBA1C 7.7 11/05/2017   Results for orders placed or performed in visit on 11/05/17  POCT glucose (manual entry)  Result Value Ref Range   POC Glucose 211 (A) 70 - 99 mg/dl  POCT  glycosylated hemoglobin (Hb A1C)  Result Value Ref Range   Hemoglobin A1C 7.7     ASSESSMENT AND PLAN:  1. Essential hypertension At goal.  Continue lisinopril/HCTZ. - Comprehensive metabolic panel; Future - CBC; Future - Lipid panel; Future  2. Colon cancer screening - Fecal occult blood, imunochemical([facility])  3. Type 2 diabetes mellitus without complication, without long-term current use of insulin (HCC) Not at goal.  Increase glipizide to 5 mg twice a day Discussed healthy eating habits.  Encourage him to cut back on white carbohydrates and to discontinue drinking sweet tea.  As a compromise he will try to do half sweet and half on sweet tea Encourage regular aerobic exercise 3-4 times a week for 30 minutes - POCT glucose (manual entry) - POCT glycosylated hemoglobin (Hb A1C) - glipiZIDE (GLUCOTROL) 5 MG tablet; Take 1 tablet (5 mg total) by mouth 2 (two) times daily before a meal.  Dispense: 60 tablet; Refill: 6  4. Pain of left hip joint - DG HIP UNILAT WITH PELVIS 2-3 VIEWS LEFT; Future  5. Obesity (BMI 30-39.9) See #3 above  Patient was given the opportunity to ask questions.  Patient verbalized understanding of the plan and was able to repeat key elements of the plan.   Orders Placed This Encounter  Procedures  . Fecal occult blood, imunochemical([facility])  . DG HIP UNILAT WITH PELVIS 2-3 VIEWS LEFT  . Comprehensive metabolic panel  . CBC  . Lipid panel  . POCT  glucose (manual entry)  . POCT glycosylated hemoglobin (Hb A1C)     Requested Prescriptions   Signed Prescriptions Disp Refills  . glipiZIDE (GLUCOTROL) 5 MG tablet 60 tablet 6    Sig: Take 1 tablet (5 mg total) by mouth 2 (two) times daily before a meal.    Return in about 3 months (around 02/03/2018).  Karle Plumber, MD, FACP

## 2017-11-05 NOTE — Progress Notes (Signed)
Pt states his left leg has been hurting for 4 months  Pt states he has pain is in feet on and off. Pt states it is like a sharp stabbing pain  Pt states his bladder leaks. Pt states it be a time when he gets up and his underwear is wet and smells like urine  Pt states his urine smells  Pt states he has been having gas  Pt states he was prescribed anti-fungal pill for jock itch and would like a refill because the itching is back

## 2017-11-05 NOTE — Patient Instructions (Signed)
Increase Glipizide to 5 mg twice a day.  Cut back on white carbohydrates and sweet tea.    Check your blood sugars 3-4 times a week for the next 2 weeks.

## 2017-11-06 ENCOUNTER — Telehealth: Payer: Self-pay | Admitting: Internal Medicine

## 2017-11-06 ENCOUNTER — Ambulatory Visit (HOSPITAL_COMMUNITY)
Admission: RE | Admit: 2017-11-06 | Discharge: 2017-11-06 | Disposition: A | Discharge status: Home / Self Care | Payer: Self-pay | Source: Ambulatory Visit | Attending: Internal Medicine | Admitting: Internal Medicine

## 2017-11-06 DIAGNOSIS — M25552 Pain in left hip: Secondary | ICD-10-CM | POA: Insufficient documentation

## 2017-11-06 DIAGNOSIS — M47816 Spondylosis without myelopathy or radiculopathy, lumbar region: Secondary | ICD-10-CM | POA: Insufficient documentation

## 2017-11-06 MED ORDER — FLUCONAZOLE 150 MG PO TABS
ORAL_TABLET | ORAL | 0 refills | Status: DC
Start: 2017-11-06 — End: 2018-01-31

## 2017-11-06 NOTE — Telephone Encounter (Signed)
Saw pt in lobby today.  States he has had recurrence of jock itch and request pill that I prescribed back in July of last yr.  I will send rxn to pharmacy for Diflucan.

## 2017-11-06 NOTE — Telephone Encounter (Signed)
Patient called to request a Anti fungal pill  If approved please send to  [facility]

## 2017-11-07 MED FILL — FLUCONAZOLE 150 MG TABLET: 150 | 28 days supply | Qty: 3 | Fill #0

## 2017-11-08 ENCOUNTER — Encounter: Payer: Self-pay | Admitting: Internal Medicine

## 2017-11-08 LAB — FECAL OCCULT BLOOD, IMMUNOCHEMICAL: Fecal Occult Bld: NEGATIVE

## 2017-11-08 NOTE — Telephone Encounter (Signed)
Mychart concern

## 2017-11-12 ENCOUNTER — Other Ambulatory Visit: Payer: Self-pay | Admitting: Internal Medicine

## 2017-11-12 ENCOUNTER — Ambulatory Visit: Payer: Self-pay | Attending: Internal Medicine

## 2017-11-12 DIAGNOSIS — I10 Essential (primary) hypertension: Secondary | ICD-10-CM

## 2017-11-12 DIAGNOSIS — R300 Dysuria: Secondary | ICD-10-CM

## 2017-11-12 NOTE — Progress Notes (Signed)
Patient here for lab visit only 

## 2017-11-13 ENCOUNTER — Ambulatory Visit: Payer: Self-pay | Attending: Internal Medicine

## 2017-11-13 DIAGNOSIS — R300 Dysuria: Secondary | ICD-10-CM

## 2017-11-13 LAB — COMPREHENSIVE METABOLIC PANEL
A/G RATIO: 1.8 (ref 1.2–2.2)
ALBUMIN: 4.6 g/dL (ref 3.5–5.5)
ALT: 43 IU/L (ref 0–44)
AST: 32 IU/L (ref 0–40)
Alkaline Phosphatase: 53 IU/L (ref 39–117)
BILIRUBIN TOTAL: 0.2 mg/dL (ref 0.0–1.2)
BUN / CREAT RATIO: 23 — AB (ref 9–20)
BUN: 16 mg/dL (ref 6–24)
CHLORIDE: 105 mmol/L (ref 96–106)
CO2: 25 mmol/L (ref 20–29)
Calcium: 9.4 mg/dL (ref 8.7–10.2)
Creatinine, Ser: 0.7 mg/dL — ABNORMAL LOW (ref 0.76–1.27)
GFR calc non Af Amer: 107 mL/min/{1.73_m2} (ref >59)
GFR, EST AFRICAN AMERICAN: 124 mL/min/{1.73_m2} (ref >59)
GLOBULIN, TOTAL: 2.6 g/dL (ref 1.5–4.5)
Glucose: 110 mg/dL — ABNORMAL HIGH (ref 65–99)
POTASSIUM: 4.3 mmol/L (ref 3.5–5.2)
SODIUM: 144 mmol/L (ref 134–144)
TOTAL PROTEIN: 7.2 g/dL (ref 6.0–8.5)

## 2017-11-13 LAB — LIPID PANEL
CHOLESTEROL TOTAL: 119 mg/dL (ref 100–199)
Chol/HDL Ratio: 2.3 ratio (ref 0.0–5.0)
HDL: 51 mg/dL (ref >39)
LDL Calculated: 53 mg/dL (ref 0–99)
Triglycerides: 74 mg/dL (ref 0–149)
VLDL CHOLESTEROL CAL: 15 mg/dL (ref 5–40)

## 2017-11-13 LAB — CBC
HEMATOCRIT: 39.4 % (ref 37.5–51.0)
Hemoglobin: 13.2 g/dL (ref 13.0–17.7)
MCH: 29.8 pg (ref 26.6–33.0)
MCHC: 33.5 g/dL (ref 31.5–35.7)
MCV: 89 fL (ref 79–97)
Platelets: 201 10*3/uL (ref 150–379)
RBC: 4.43 x10E6/uL (ref 4.14–5.80)
RDW: 14.1 % (ref 12.3–15.4)
WBC: 6.4 10*3/uL (ref 3.4–10.8)

## 2017-11-17 ENCOUNTER — Encounter (HOSPITAL_COMMUNITY): Payer: Self-pay | Admitting: *Deleted

## 2017-11-17 ENCOUNTER — Ambulatory Visit (HOSPITAL_COMMUNITY)
Admission: EM | Admit: 2017-11-17 | Discharge: 2017-11-17 | Disposition: A | Discharge status: Home / Self Care | Payer: Self-pay | Attending: Family Medicine | Admitting: Family Medicine

## 2017-11-17 ENCOUNTER — Other Ambulatory Visit: Payer: Self-pay

## 2017-11-17 DIAGNOSIS — J111 Influenza due to unidentified influenza virus with other respiratory manifestations: Secondary | ICD-10-CM

## 2017-11-17 DIAGNOSIS — R69 Illness, unspecified: Secondary | ICD-10-CM

## 2017-11-17 MED ORDER — HYDROCODONE-HOMATROPINE 5-1.5 MG/5ML PO SYRP: 5.0000 mL | ORAL_SOLUTION | Freq: Four times a day (QID) | ORAL | 0 refills | Status: DC | PRN

## 2017-11-17 MED ORDER — PREDNISONE 10 MG (21) PO TBPK: ORAL_TABLET | Freq: Every day | ORAL | 0 refills | Status: DC

## 2017-11-17 NOTE — Discharge Instructions (Signed)

## 2017-11-19 MED FILL — ?GLIPIZIDE 5MG TABLET: 5 | 30 days supply | Qty: 60 | Fill #0

## 2017-11-19 MED FILL — LISINOPRIL-HCTZ 10-12.5 MG: 10-12.5 | 30 days supply | Qty: 30 | Fill #9

## 2017-11-19 MED FILL — ?OMEPRAZOLE 20 MG CPDR: 20 | 30 days supply | Qty: 30 | Fill #2

## 2017-11-19 NOTE — ED Provider Notes (Signed)
[facility]   [id number] 11/17/17 Arrival Time: 1930  ASSESSMENT & PLAN:  1. Influenza-like illness     Meds ordered this encounter  Medications  . predniSONE (STERAPRED UNI-PAK 21 TAB) 10 MG (21) TBPK tablet    Sig: Take by mouth daily. Take as directed.    Dispense:  21 tablet    Refill:  0  . HYDROcodone-homatropine (HYCODAN) 5-1.5 MG/5ML syrup    Sig: Take 5 mLs by mouth every 6 (six) hours as needed for cough.    Dispense:  90 mL    Refill:  0   Cough medication sedation precautions. Discussed typical duration of symptoms. OTC symptom care as needed. Ensure adequate fluid intake and rest. May f/u with PCP or here as needed.  Reviewed expectations re: course of current medical issues. Questions answered. Outlined signs and symptoms indicating need for more acute intervention. Patient verbalized understanding. After Visit Summary given.   SUBJECTIVE: History from: patient.  Jeremy [name] is a 57 y.o. male who presents with complaint of nasal congestion, post-nasal drainage, and a persistent dry cough. Onset abrupt, approximately a few days ago. Overall fatigued with body aches. SOB: none. Wheezing: mild. Fever: unsure. Overall normal PO intake without n/v. Sick contacts: no. OTC treatment: cough med without relief.  Received flu shot this year: no.  Social History   Tobacco Use  Smoking Status Former Smoker  . Last attempt to quit: 10/16/1988  . Years since quitting: 29.1  Smokeless Tobacco Never Used    ROS: As per HPI.   OBJECTIVE:  Vitals:   11/17/17 2032  BP: 124/63  Pulse: 95  Resp: 18  Temp: 98.6 F (37 C)  SpO2: 99%     General appearance: alert; appears fatigued HEENT: nasal congestion; clear runny nose; throat irritation secondary to post-nasal drainage Neck: supple without LAD Lungs: unlabored respirations, symmetrical air entry; few exp wheeezes; cough: moderate; no respiratory distress Skin: warm and dry Psychological:  alert and cooperative; normal mood and affect   Allergies  Allergen Reactions  . Cymbalta [Duloxetine Hcl] Other (See Comments)    Caused anorgasmia    Past Medical History:  Diagnosis Date  . Diabetes mellitus without complication (HCC)   . GERD (gastroesophageal reflux disease)   . Hyperlipidemia   . Hypertension   . Rash of entire body 03/2016  . Sleep apnea    NO CPAP per pt   Family History  Problem Relation Age of Onset  . Pancreatic cancer Mother   . CAD Father   . Colon cancer Paternal Grandfather    Social History   Socioeconomic History  . Marital status: Legally Separated    Spouse name: Not on file  . Number of children: Not on file  . Years of education: Not on file  . Highest education level: Not on file  Social Needs  . Financial resource strain: Not on file  . Food insecurity - worry: Not on file  . Food insecurity - inability: Not on file  . Transportation needs - medical: Not on file  . Transportation needs - non-medical: Not on file  Occupational History  . Occupation: Product/process development scientist  Tobacco Use  . Smoking status: Former Smoker    Last attempt to quit: 10/16/1988    Years since quitting: 29.1  . Smokeless tobacco: Never Used  Substance and Sexual Activity  . Alcohol use: No  . Drug use: No  . Sexual activity: Not on file  Other Topics Concern  .  Not on file  Social History Narrative   Volunteers as firefighter           Mardella LaymanHagler, Jairy Angulo, MD 11/19/17 [phone]

## 2017-11-26 ENCOUNTER — Ambulatory Visit: Payer: Self-pay | Attending: Internal Medicine | Admitting: Internal Medicine

## 2017-11-26 ENCOUNTER — Encounter: Payer: Self-pay | Admitting: Internal Medicine

## 2017-11-26 VITALS — BP 144/82 | HR 72 | Temp 98.3°F | Ht 70.0 in | Wt 224.0 lb

## 2017-11-26 DIAGNOSIS — Z6830 Body mass index (BMI) 30.0-30.9, adult: Secondary | ICD-10-CM | POA: Insufficient documentation

## 2017-11-26 DIAGNOSIS — E119 Type 2 diabetes mellitus without complications: Secondary | ICD-10-CM | POA: Insufficient documentation

## 2017-11-26 DIAGNOSIS — G4700 Insomnia, unspecified: Secondary | ICD-10-CM | POA: Insufficient documentation

## 2017-11-26 DIAGNOSIS — R059 Cough, unspecified: Secondary | ICD-10-CM

## 2017-11-26 DIAGNOSIS — E669 Obesity, unspecified: Secondary | ICD-10-CM | POA: Insufficient documentation

## 2017-11-26 DIAGNOSIS — Z7984 Long term (current) use of oral hypoglycemic drugs: Secondary | ICD-10-CM | POA: Insufficient documentation

## 2017-11-26 DIAGNOSIS — M25511 Pain in right shoulder: Secondary | ICD-10-CM | POA: Insufficient documentation

## 2017-11-26 DIAGNOSIS — J209 Acute bronchitis, unspecified: Secondary | ICD-10-CM | POA: Insufficient documentation

## 2017-11-26 DIAGNOSIS — R079 Chest pain, unspecified: Secondary | ICD-10-CM | POA: Insufficient documentation

## 2017-11-26 DIAGNOSIS — R05 Cough: Secondary | ICD-10-CM | POA: Insufficient documentation

## 2017-11-26 DIAGNOSIS — Z888 Allergy status to other drugs, medicaments and biological substances status: Secondary | ICD-10-CM | POA: Insufficient documentation

## 2017-11-26 DIAGNOSIS — K219 Gastro-esophageal reflux disease without esophagitis: Secondary | ICD-10-CM | POA: Insufficient documentation

## 2017-11-26 DIAGNOSIS — Z79899 Other long term (current) drug therapy: Secondary | ICD-10-CM | POA: Insufficient documentation

## 2017-11-26 DIAGNOSIS — M25512 Pain in left shoulder: Secondary | ICD-10-CM | POA: Insufficient documentation

## 2017-11-26 DIAGNOSIS — Z87891 Personal history of nicotine dependence: Secondary | ICD-10-CM | POA: Insufficient documentation

## 2017-11-26 DIAGNOSIS — I10 Essential (primary) hypertension: Secondary | ICD-10-CM | POA: Insufficient documentation

## 2017-11-26 MED ORDER — AZITHROMYCIN 250 MG PO TABS: ORAL_TABLET | ORAL | 0 refills | Status: DC

## 2017-11-26 MED ORDER — HYDROCOD POLST-CPM POLST ER 10-8 MG/5ML PO SUER: 5.0000 mL | Freq: Two times a day (BID) | ORAL | 0 refills | Status: DC | PRN

## 2017-11-26 MED ORDER — ALBUTEROL SULFATE HFA 108 (90 BASE) MCG/ACT IN AERS: 2.0000 | INHALATION_SPRAY | Freq: Four times a day (QID) | RESPIRATORY_TRACT | 0 refills | Status: DC | PRN

## 2017-11-26 MED FILL — !VENTOLIN HFA INHALER: 108 (90 BAS | 25 days supply | Qty: 18 | Fill #0

## 2017-11-26 MED FILL — AZITHROMYCIN 250 MG TABLET: 250 | 5 days supply | Qty: 6 | Fill #0

## 2017-11-26 NOTE — Progress Notes (Signed)
Patient ID: GEVON MARKUS, male    DOB: 25-Nov-1960  MRN: [id number]  CC: Cough; Insomnia (due to cough); Pain (shoulders, throat, chest); Hypertension; and Diabetes   Subjective: Lennell Shanks is a 57 y.o. male who presents for UC visit His concerns today include:  Pt c/o cough productive of green phlegm, SOB x 2 wks  + hoarsness -+ chest pains from coughing -Fever off and on highest 100.3 last wk Tried Dayquil/Nyquil, Robitussin DM  Patient Active Problem List   Diagnosis Date Noted  . Obesity (BMI 30-39.9) 11/05/2017  . GERD (gastroesophageal reflux disease) 06/03/2017  . Chronic pain of both knees 06/03/2017  . Chronic lower back pain 06/03/2017  . Type 2 diabetes mellitus without complication, without long-term current use of insulin ([place]) 04/12/2016  . Essential hypertension 04/12/2016     Current Outpatient Medications on File Prior to Visit  Medication Sig Dispense Refill  . acetaminophen (TYLENOL) 500 MG tablet Take 500 mg by mouth 2 (two) times daily.    Marland Kitchen atorvastatin (LIPITOR) 40 MG tablet Take 1 tablet (40 mg total) by mouth daily. 90 tablet 3  . Blood Glucose Monitoring Suppl (TRUE METRIX METER) w/Device KIT 1 each by Does not apply route 4 (four) times daily -  before meals and at bedtime. 1 kit 0  . fluconazole (DIFLUCAN) 150 MG tablet 1 tab po Q wk x 3 wks 3 tablet 0  . glipiZIDE (GLUCOTROL) 5 MG tablet Take 1 tablet (5 mg total) by mouth 2 (two) times daily before a meal. 60 tablet 6  . glucose blood (TRUE METRIX BLOOD GLUCOSE TEST) test strip Use as instructed 100 each 12  . lisinopril-hydrochlorothiazide (PRINZIDE,ZESTORETIC) 10-12.5 MG tablet Take 1 tablet by mouth daily. 90 tablet 3  . metFORMIN (GLUCOPHAGE) 1000 MG tablet Take 1 tablet (1,000 mg total) by mouth 2 (two) times daily with a meal. 180 tablet 3  . naproxen sodium (ANAPROX) 220 MG tablet Take 440 mg by mouth 2 (two) times daily with a meal.    . omeprazole (PRILOSEC) 20 MG capsule Take 1 capsule  (20 mg total) daily by mouth. 30 capsule 5  . OVER THE COUNTER MEDICATION Take 2 tablets by mouth at bedtime. "RESTLESS LEGS OTC"    . potassium chloride (K-DUR) 10 MEQ tablet Take 1 tablet (10 mEq total) by mouth daily. 30 tablet 2  . tamsulosin (FLOMAX) 0.4 MG CAPS capsule Take 1 capsule (0.4 mg total) by mouth daily. 90 capsule 3  . diclofenac sodium (VOLTAREN) 1 % GEL Apply 2 g topically 4 (four) times daily. (Patient not taking: Reported on 11/26/2017) 100 g 2  . diphenhydrAMINE (BENADRYL) 25 MG tablet Take 25 mg by mouth every 6 (six) hours as needed for itching or allergies. Reported on 04/12/2016    . loratadine (CLARITIN) 10 MG tablet Take 1 tablet (10 mg total) by mouth daily. (Patient not taking: Reported on 11/26/2017) 90 tablet 0  . Na Sulfate-K Sulfate-Mg Sulf 17.5-3.13-1.6 GM/180ML SOLN Suprep (no substitutions)-TAKE AS DIRECTED.  Sample of this drug were given to the patient, quantity x 1 kit, Lot Number [id number] , exp.6/20 (Patient not taking: Reported on 11/26/2017) 354 mL 0  . predniSONE (STERAPRED UNI-PAK 21 TAB) 10 MG (21) TBPK tablet Take by mouth daily. Take as directed. (Patient not taking: Reported on 11/26/2017) 21 tablet 0   No current facility-administered medications on file prior to visit.     Allergies  Allergen Reactions  . Cymbalta [Duloxetine Hcl] Other (See  Comments)    Caused anorgasmia    Social History   Socioeconomic History  . Marital status: Legally Separated    Spouse name: Not on file  . Number of children: Not on file  . Years of education: Not on file  . Highest education level: Not on file  Social Needs  . Financial resource strain: Not on file  . Food insecurity - worry: Not on file  . Food insecurity - inability: Not on file  . Transportation needs - medical: Not on file  . Transportation needs - non-medical: Not on file  Occupational History  . Occupation: Clinical biochemist  Tobacco Use  . Smoking status: Former Smoker    Last attempt  to quit: 10/16/1988    Years since quitting: 29.1  . Smokeless tobacco: Never Used  Substance and Sexual Activity  . Alcohol use: No  . Drug use: No  . Sexual activity: Not on file  Other Topics Concern  . Not on file  Social History Narrative   Volunteers as Airline pilot    Family History  Problem Relation Age of Onset  . Pancreatic cancer Mother   . CAD Father   . Colon cancer Paternal Grandfather     Past Surgical History:  Procedure Laterality Date  . BACK SURGERY  1993   lumbar  . CERVICAL SPINE SURGERY  2000  . HAND SURGERY      ROS: Review of Systems As above PHYSICAL EXAM: BP (!) 144/82 (BP Location: Right Arm, Patient Position: Sitting, Cuff Size: Normal)   Pulse 72   Temp 98.3 F (36.8 C) (Oral)   Ht 5' 10"  (1.778 m)   Wt 224 lb (101.6 kg)   SpO2 95%   BMI 32.14 kg/m   Physical Exam General appearance - alert, middle-aged Caucasian male in NAD.  He has mild audible congestion and hoarseness  mental status - alert, oriented to person, place, and time, normal mood, behavior, speech, dress, motor activity, and thought processes Nose - normal and patent, no erythema, discharge or polyps Neck - supple, no significant adenopathy Chest -popping sounds bilaterally both bases of the lung fields are clear Heart - normal rate, regular rhythm, normal S1, S2, no murmurs, rubs, clicks or gallops   ASSESSMENT AND PLAN: 1. Acute bronchitis, unspecified organism -Bronchitis versus bilateral pneumonia versus flu -Start Zithromax and narcotic cough syrup.  Patient to have chest x-ray done today - chlorpheniramine-HYDROcodone (TUSSIONEX PENNKINETIC ER) 10-8 MG/5ML SUER; Take 5 mLs by mouth every 12 (twelve) hours as needed for cough.  Dispense: 100 mL; Refill: 0 - azithromycin (ZITHROMAX) 250 MG tablet; 2 tabs PO x 1 than 1 tab PO daily  Dispense: 6 tablet; Refill: 0 - albuterol (PROVENTIL HFA;VENTOLIN HFA) 108 (90 Base) MCG/ACT inhaler; Inhale 2 puffs into the lungs every  6 (six) hours as needed for wheezing or shortness of breath.  Dispense: 1 Inhaler; Refill: 0  2. Cough - DG Chest 2 View; Future   Patient was given the opportunity to ask questions.  Patient verbalized understanding of the plan and was able to repeat key elements of the plan.   Orders Placed This Encounter  Procedures  . DG Chest 2 View     Requested Prescriptions   Signed Prescriptions Disp Refills  . chlorpheniramine-HYDROcodone (TUSSIONEX PENNKINETIC ER) 10-8 MG/5ML SUER 100 mL 0    Sig: Take 5 mLs by mouth every 12 (twelve) hours as needed for cough.  Marland Kitchen azithromycin (ZITHROMAX) 250 MG tablet 6 tablet 0  Sig: 2 tabs PO x 1 than 1 tab PO daily  . albuterol (PROVENTIL HFA;VENTOLIN HFA) 108 (90 Base) MCG/ACT inhaler 1 Inhaler 0    Sig: Inhale 2 puffs into the lungs every 6 (six) hours as needed for wheezing or shortness of breath.    No Follow-up on file.  Karle Plumber, MD, FACP

## 2017-11-27 ENCOUNTER — Ambulatory Visit (HOSPITAL_COMMUNITY)
Admission: RE | Admit: 2017-11-27 | Discharge: 2017-11-27 | Disposition: A | Discharge status: Home / Self Care | Payer: Self-pay | Source: Ambulatory Visit | Attending: Internal Medicine | Admitting: Internal Medicine

## 2017-11-27 DIAGNOSIS — R059 Cough, unspecified: Secondary | ICD-10-CM

## 2017-11-27 DIAGNOSIS — R05 Cough: Secondary | ICD-10-CM | POA: Insufficient documentation

## 2017-11-27 MED FILL — ?ATORVASTATIN 40MG TABLET: 40 | 30 days supply | Qty: 30 | Fill #9

## 2017-11-27 MED FILL — ?METFORMIN HCL 1,000 MG TAB: 1000 | 30 days supply | Qty: 60 | Fill #1

## 2017-11-27 MED FILL — ?TAMSULOSIN HCL 0.4 MG CAP: 0.4 | 30 days supply | Qty: 30 | Fill #3

## 2017-12-24 MED FILL — ?OMEPRAZOLE DR 20MG CAPSULE: 20 | 30 days supply | Qty: 30 | Fill #3

## 2017-12-24 MED FILL — ?TAMSULOSIN HCL 0.4 MG CAP: 0.4 | 30 days supply | Qty: 30 | Fill #4

## 2017-12-24 MED FILL — ?ATORVASTATIN 40MG TABLET: 40 | 30 days supply | Qty: 30 | Fill #10

## 2017-12-24 MED FILL — LISINOPRIL-HCTZ 10-12.5 MG: 10-12.5 | 30 days supply | Qty: 30 | Fill #10

## 2018-01-01 ENCOUNTER — Ambulatory Visit: Payer: Self-pay | Attending: Internal Medicine

## 2018-01-01 DIAGNOSIS — R300 Dysuria: Secondary | ICD-10-CM | POA: Insufficient documentation

## 2018-01-01 NOTE — Progress Notes (Signed)
Patient here for lab visit only 

## 2018-01-02 ENCOUNTER — Other Ambulatory Visit: Payer: Self-pay

## 2018-01-02 LAB — URINALYSIS
BILIRUBIN UA: NEGATIVE
KETONES UA: NEGATIVE
Leukocytes, UA: NEGATIVE
Nitrite, UA: NEGATIVE
Protein, UA: NEGATIVE
RBC, UA: NEGATIVE
Specific Gravity, UA: >=1.03 — AB (ref 1.005–1.030)
Urobilinogen, Ur: 0.2 mg/dL (ref 0.2–1.0)
pH, UA: 5.5 (ref 5.0–7.5)

## 2018-01-02 MED ORDER — METFORMIN HCL 1000 MG PO TABS: 1000.0000 mg | ORAL_TABLET | Freq: Two times a day (BID) | ORAL | 3 refills | Status: DC

## 2018-01-02 MED FILL — ?METFORMIN HCL 1,000 MG TAB: 1000 | 30 days supply | Qty: 60 | Fill #0

## 2018-01-24 MED FILL — ?OMEPRAZOLE DR 20MG CAPSULE: 20 | 30 days supply | Qty: 30 | Fill #4

## 2018-01-31 ENCOUNTER — Ambulatory Visit: Payer: Self-pay | Attending: Internal Medicine | Admitting: Internal Medicine

## 2018-01-31 ENCOUNTER — Encounter: Payer: Self-pay | Admitting: Internal Medicine

## 2018-01-31 VITALS — BP 154/74 | HR 70 | Temp 98.6°F | Resp 16 | Wt 229.0 lb

## 2018-01-31 DIAGNOSIS — Z79899 Other long term (current) drug therapy: Secondary | ICD-10-CM | POA: Insufficient documentation

## 2018-01-31 DIAGNOSIS — I10 Essential (primary) hypertension: Secondary | ICD-10-CM | POA: Insufficient documentation

## 2018-01-31 DIAGNOSIS — N39498 Other specified urinary incontinence: Secondary | ICD-10-CM

## 2018-01-31 DIAGNOSIS — R32 Unspecified urinary incontinence: Secondary | ICD-10-CM | POA: Insufficient documentation

## 2018-01-31 DIAGNOSIS — E119 Type 2 diabetes mellitus without complications: Secondary | ICD-10-CM | POA: Insufficient documentation

## 2018-01-31 DIAGNOSIS — N3943 Post-void dribbling: Secondary | ICD-10-CM | POA: Insufficient documentation

## 2018-01-31 DIAGNOSIS — K219 Gastro-esophageal reflux disease without esophagitis: Secondary | ICD-10-CM | POA: Insufficient documentation

## 2018-01-31 DIAGNOSIS — N401 Enlarged prostate with lower urinary tract symptoms: Secondary | ICD-10-CM | POA: Insufficient documentation

## 2018-01-31 DIAGNOSIS — G8929 Other chronic pain: Secondary | ICD-10-CM | POA: Insufficient documentation

## 2018-01-31 DIAGNOSIS — Z87891 Personal history of nicotine dependence: Secondary | ICD-10-CM | POA: Insufficient documentation

## 2018-01-31 DIAGNOSIS — Z7984 Long term (current) use of oral hypoglycemic drugs: Secondary | ICD-10-CM | POA: Insufficient documentation

## 2018-01-31 DIAGNOSIS — Z888 Allergy status to other drugs, medicaments and biological substances status: Secondary | ICD-10-CM | POA: Insufficient documentation

## 2018-01-31 LAB — POCT GLYCOSYLATED HEMOGLOBIN (HGB A1C): Hemoglobin A1C: 8.1

## 2018-01-31 LAB — GLUCOSE, POCT (MANUAL RESULT ENTRY): POC GLUCOSE: 195 mg/dL — AB (ref 70–99)

## 2018-01-31 MED ORDER — TAMSULOSIN HCL 0.4 MG PO CAPS: 0.4000 mg | ORAL_CAPSULE | Freq: Every day | ORAL | 3 refills | Status: DC

## 2018-01-31 MED ORDER — ATORVASTATIN CALCIUM 40 MG PO TABS: 40.0000 mg | ORAL_TABLET | Freq: Every day | ORAL | 3 refills | Status: DC

## 2018-01-31 MED ORDER — GLIPIZIDE 5 MG PO TABS: 5.0000 mg | ORAL_TABLET | Freq: Two times a day (BID) | ORAL | 6 refills | Status: DC

## 2018-01-31 MED ORDER — LISINOPRIL 20 MG PO TABS
20.0000 mg | ORAL_TABLET | Freq: Every day | ORAL | 3 refills | Status: DC
Start: 2018-01-31 — End: 2019-02-24

## 2018-01-31 MED FILL — ?TAMSULOSIN HCL 0.4 MG CAP: 0.4 | 30 days supply | Qty: 30 | Fill #0

## 2018-01-31 MED FILL — LISINOPRIL 20 MG TAB: 20 | 30 days supply | Qty: 30 | Fill #0

## 2018-01-31 MED FILL — glipiZIDE 5 MG TABS: 5 | 30 days supply | Qty: 60 | Fill #0

## 2018-01-31 MED FILL — ?ATORVASTATIN 40MG TABLET: 40 | 30 days supply | Qty: 30 | Fill #0

## 2018-01-31 NOTE — Progress Notes (Signed)
Patient ID: Jeremy [name], male    DOB: Apr 15, 1961  MRN: [id number]  CC: Diabetes and Hypertension   Subjective: Jeremy [name] is a 57 y.o. male who presents for chronic ds managementt His concerns today include:  Pt with hx of DM, HL, HTN,GERD, chronic LBP, ED.  1.  DM: checking BS once a mth. Has medicines with him today and I do not see glipizide bottle.  Patient thinks he may have been out of it for a week.  He is taking the Metformin.  He has cut back on eating white carbohydrates.  He is exercising more; walking his dog twice a week and also working more. -No numbness or tingling in the feet.  2.  Complains of some urinary incontinence over the past 1 month.  He has noticed post void dribbling and then intermittently he can be sitting down and he feels a small amount of leakage that stains his underwear.  He has had 1-2 episodes over the past week where he got leakage of urine before he was able to get to the restroom .he wakes up about once at nights to urinate.  During the day he urinates a little bit more frequently.  If he drinks an 8 of 12 ounce bottle of water he will urinate several times in about 2 hours.  He is on lisinopril HCTZ.  Denies any dysuria or hematuria.  Some incontinue - post void dribbling.  Also feels a drip of urine x 1 mth  3.  HTN: compliant with lisinopril HCTZ.  He is taking an over-the-counter potassium supplement.  Reports dizziness and low blood pressure of  90/72 1 day a few weeks ago.  He held off on taking his blood pressure medicine for about 2 days after that.  He is not had any recurrent episodes.   Patient Active Problem List   Diagnosis Date Noted  . Obesity (BMI 30-39.9) 11/05/2017  . GERD (gastroesophageal reflux disease) 06/03/2017  . Chronic pain of both knees 06/03/2017  . Chronic lower back pain 06/03/2017  . Type 2 diabetes mellitus without complication, without long-term current use of insulin ([place]) 04/12/2016  . Essential hypertension  04/12/2016     Current Outpatient Medications on File Prior to Visit  Medication Sig Dispense Refill  . acetaminophen (TYLENOL) 500 MG tablet Take 500 mg by mouth 2 (two) times daily.    Marland Kitchen albuterol (PROVENTIL HFA;VENTOLIN HFA) 108 (90 Base) MCG/ACT inhaler Inhale 2 puffs into the lungs every 6 (six) hours as needed for wheezing or shortness of breath. 1 Inhaler 0  . Blood Glucose Monitoring Suppl (TRUE METRIX METER) w/Device KIT 1 each by Does not apply route 4 (four) times daily -  before meals and at bedtime. 1 kit 0  . chlorpheniramine-HYDROcodone (TUSSIONEX PENNKINETIC ER) 10-8 MG/5ML SUER Take 5 mLs by mouth every 12 (twelve) hours as needed for cough. (Patient not taking: Reported on 01/31/2018) 100 mL 0  . diclofenac sodium (VOLTAREN) 1 % GEL Apply 2 g topically 4 (four) times daily. (Patient not taking: Reported on 11/26/2017) 100 g 2  . diphenhydrAMINE (BENADRYL) 25 MG tablet Take 25 mg by mouth every 6 (six) hours as needed for itching or allergies. Reported on 04/12/2016    . glucose blood (TRUE METRIX BLOOD GLUCOSE TEST) test strip Use as instructed 100 each 12  . metFORMIN (GLUCOPHAGE) 1000 MG tablet Take 1 tablet (1,000 mg total) by mouth 2 (two) times daily with a meal. 180 tablet 3  .  Na Sulfate-K Sulfate-Mg Sulf 17.5-3.13-1.6 GM/180ML SOLN Suprep (no substitutions)-TAKE AS DIRECTED.  Sample of this drug were given to the patient, quantity x 1 kit, Lot Number [id number] , exp.6/20 (Patient not taking: Reported on 11/26/2017) 354 mL 0  . naproxen sodium (ANAPROX) 220 MG tablet Take 440 mg by mouth 2 (two) times daily with a meal.    . omeprazole (PRILOSEC) 20 MG capsule Take 1 capsule (20 mg total) daily by mouth. 30 capsule 5  . OVER THE COUNTER MEDICATION Take 2 tablets by mouth at bedtime. "RESTLESS LEGS OTC"    . potassium chloride (K-DUR) 10 MEQ tablet Take 1 tablet (10 mEq total) by mouth daily. (Patient not taking: Reported on 01/31/2018) 30 tablet 2   No current  facility-administered medications on file prior to visit.     Allergies  Allergen Reactions  . Cymbalta [Duloxetine Hcl] Other (See Comments)    Caused anorgasmia    Social History   Socioeconomic History  . Marital status: Legally Separated    Spouse name: Not on file  . Number of children: Not on file  . Years of education: Not on file  . Highest education level: Not on file  Occupational History  . Occupation: Furniture conservator/restorer  . Financial resource strain: Not on file  . Food insecurity:    Worry: Not on file    Inability: Not on file  . Transportation needs:    Medical: Not on file    Non-medical: Not on file  Tobacco Use  . Smoking status: Former Smoker    Last attempt to quit: 10/16/1988    Years since quitting: 29.3  . Smokeless tobacco: Never Used  Substance and Sexual Activity  . Alcohol use: No  . Drug use: No  . Sexual activity: Not on file  Lifestyle  . Physical activity:    Days per week: Not on file    Minutes per session: Not on file  . Stress: Not on file  Relationships  . Social connections:    Talks on phone: Not on file    Gets together: Not on file    Attends religious service: Not on file    Active member of club or organization: Not on file    Attends meetings of clubs or organizations: Not on file    Relationship status: Not on file  . Intimate partner violence:    Fear of current or ex partner: Not on file    Emotionally abused: Not on file    Physically abused: Not on file    Forced sexual activity: Not on file  Other Topics Concern  . Not on file  Social History Narrative   Volunteers as Airline pilot    Family History  Problem Relation Age of Onset  . Pancreatic cancer Mother   . CAD Father   . Colon cancer Paternal Grandfather     Past Surgical History:  Procedure Laterality Date  . BACK SURGERY  1993   lumbar  . CERVICAL SPINE SURGERY  2000  . HAND SURGERY      ROS: Review of Systems Negative except  as stated above PHYSICAL EXAM: BP (!) 154/74   Pulse 70   Temp 98.6 F (37 C) (Oral)   Resp 16   Wt 229 lb (103.9 kg)   SpO2 97%   BMI 32.86 kg/m   Wt Readings from Last 3 Encounters:  01/31/18 229 lb (103.9 kg)  11/26/17 224 lb (101.6 kg)  11/05/17 235  lb (106.6 kg)    Physical Exam  General appearance - alert, well appearing, and in no distress Mental status - alert, oriented to person, place, and time, normal mood, behavior, speech, dress, motor activity, and thought processes Neck - supple, no significant adenopathy Chest - clear to auscultation, no wheezes, rales or rhonchi, symmetric air entry Heart - normal rate, regular rhythm, normal S1, S2, no murmurs, rubs, clicks or gallops Extremities - peripheral pulses normal, no pedal edema, no clubbing or cyanosis Diabetic Foot Exam - Simple   Simple Foot Form Visual Inspection See comments:  Yes Sensation Testing Intact to touch and monofilament testing bilaterally:  Yes Pulse Check Posterior Tibialis and Dorsalis pulse intact bilaterally:  Yes Comments Mild peeling of skin around the medial edge of the left foot.  Toenails are clipped.  Ingrown toenail noted of the left big toe.      BS 195/A1C 8.1  ASSESSMENT AND PLAN: 1. Type 2 diabetes mellitus without complication, without long-term current use of insulin (HCC) Not at goal.  Patient has been out of glipizide.  Refill given today.  Continue metformin.  Continue to encourage healthy eating habits and commended him on getting in more physical activity. - POCT glucose (manual entry) - POCT glycosylated hemoglobin (Hb A1C) - glipiZIDE (GLUCOTROL) 5 MG tablet; Take 1 tablet (5 mg total) by mouth 2 (two) times daily before a meal.  Dispense: 60 tablet; Refill: 6  2. Essential hypertension Not at goal.  Given his urinary symptoms we discussed stopping the lisinopril HCTZ and changing him just to lisinopril 20 mg.  Continue to limit salt in the foods. - lisinopril  (PRINIVIL,ZESTRIL) 20 MG tablet; Take 1 tablet (20 mg total) by mouth daily. Stop Lisinopril/HCTZ  Dispense: 90 tablet; Refill: 3  3. Other urinary incontinence Patient is having some post void dribbling and also symptoms suggesting overactive bladder.  We will check a urinalysis to rule out infection.  We agreed to discontinue the diuretic portion of his blood pressure medication to see whether symptoms improve.  If they do not we can put him on medication for overactive bladder and refer to urology.  Follow-up in 6 weeks to see how he is doing. - Urinalysis  4. Benign prostatic hyperplasia with post-void dribbling - tamsulosin (FLOMAX) 0.4 MG CAPS capsule; Take 1 capsule (0.4 mg total) by mouth daily.  Dispense: 90 capsule; Refill: 3  Patient was given the opportunity to ask questions.  Patient verbalized understanding of the plan and was able to repeat key elements of the plan.   Orders Placed This Encounter  Procedures  . Urinalysis  . POCT glucose (manual entry)  . POCT glycosylated hemoglobin (Hb A1C)     Requested Prescriptions   Signed Prescriptions Disp Refills  . atorvastatin (LIPITOR) 40 MG tablet 90 tablet 3    Sig: Take 1 tablet (40 mg total) by mouth daily.  . tamsulosin (FLOMAX) 0.4 MG CAPS capsule 90 capsule 3    Sig: Take 1 capsule (0.4 mg total) by mouth daily.  Marland Kitchen glipiZIDE (GLUCOTROL) 5 MG tablet 60 tablet 6    Sig: Take 1 tablet (5 mg total) by mouth 2 (two) times daily before a meal.  . lisinopril (PRINIVIL,ZESTRIL) 20 MG tablet 90 tablet 3    Sig: Take 1 tablet (20 mg total) by mouth daily. Stop Lisinopril/HCTZ    Return in about 6 weeks (around 03/14/2018).  Karle Plumber, MD, FACP

## 2018-01-31 NOTE — Patient Instructions (Signed)
Stop Lisinopril/HCTZ. Start Lisinopril 20 mg daily.

## 2018-02-04 NOTE — Telephone Encounter (Signed)
Lab visit

## 2018-02-06 MED FILL — ?METFORMIN HCL 1,000 MG TAB: 1000 | 30 days supply | Qty: 60 | Fill #1

## 2018-02-13 LAB — URINALYSIS
BILIRUBIN UA: NEGATIVE
Leukocytes, UA: NEGATIVE
Nitrite, UA: NEGATIVE
PH UA: 5.5 (ref 5.0–7.5)
PROTEIN UA: NEGATIVE
RBC UA: NEGATIVE
UUROB: 1 mg/dL (ref 0.2–1.0)

## 2018-02-22 MED FILL — OMEPRAZOLE 20 MG CAP: 20 | 30 days supply | Qty: 30 | Fill #5

## 2018-03-06 MED FILL — ?TAMSULOSIN HCL 0.4 MG CAP: 0.4 | 30 days supply | Qty: 30 | Fill #1

## 2018-03-06 MED FILL — glipiZIDE 5 MG TABS: 5 | 30 days supply | Qty: 60 | Fill #1

## 2018-03-06 MED FILL — ?METFORMIN HCL 1,000 MG TAB: 1000 | 30 days supply | Qty: 60 | Fill #2

## 2018-03-06 MED FILL — ?ATORVASTATIN 40MG TABLET: 40 | 30 days supply | Qty: 30 | Fill #1

## 2018-03-06 MED FILL — LISINOPRIL 20 MG TAB: 20 | 30 days supply | Qty: 30 | Fill #1

## 2018-03-14 ENCOUNTER — Encounter: Payer: Self-pay | Admitting: Internal Medicine

## 2018-03-14 ENCOUNTER — Ambulatory Visit: Payer: Self-pay | Attending: Internal Medicine | Admitting: Internal Medicine

## 2018-03-14 VITALS — BP 134/78 | HR 70 | Temp 98.1°F | Resp 16 | Wt 233.8 lb

## 2018-03-14 DIAGNOSIS — Z6833 Body mass index (BMI) 33.0-33.9, adult: Secondary | ICD-10-CM | POA: Insufficient documentation

## 2018-03-14 DIAGNOSIS — Z87891 Personal history of nicotine dependence: Secondary | ICD-10-CM | POA: Insufficient documentation

## 2018-03-14 DIAGNOSIS — N400 Enlarged prostate without lower urinary tract symptoms: Secondary | ICD-10-CM | POA: Insufficient documentation

## 2018-03-14 DIAGNOSIS — G8929 Other chronic pain: Secondary | ICD-10-CM | POA: Insufficient documentation

## 2018-03-14 DIAGNOSIS — R32 Unspecified urinary incontinence: Secondary | ICD-10-CM | POA: Insufficient documentation

## 2018-03-14 DIAGNOSIS — Z7984 Long term (current) use of oral hypoglycemic drugs: Secondary | ICD-10-CM | POA: Insufficient documentation

## 2018-03-14 DIAGNOSIS — Z888 Allergy status to other drugs, medicaments and biological substances status: Secondary | ICD-10-CM | POA: Insufficient documentation

## 2018-03-14 DIAGNOSIS — E669 Obesity, unspecified: Secondary | ICD-10-CM | POA: Insufficient documentation

## 2018-03-14 DIAGNOSIS — I10 Essential (primary) hypertension: Secondary | ICD-10-CM | POA: Insufficient documentation

## 2018-03-14 DIAGNOSIS — Z79899 Other long term (current) drug therapy: Secondary | ICD-10-CM | POA: Insufficient documentation

## 2018-03-14 DIAGNOSIS — K219 Gastro-esophageal reflux disease without esophagitis: Secondary | ICD-10-CM | POA: Insufficient documentation

## 2018-03-14 DIAGNOSIS — E119 Type 2 diabetes mellitus without complications: Secondary | ICD-10-CM | POA: Insufficient documentation

## 2018-03-14 DIAGNOSIS — N39498 Other specified urinary incontinence: Secondary | ICD-10-CM

## 2018-03-14 LAB — GLUCOSE, POCT (MANUAL RESULT ENTRY): POC GLUCOSE: 148 mg/dL — AB (ref 70–99)

## 2018-03-14 NOTE — Progress Notes (Signed)
Patient ID: Jeremy [name], male    DOB: 25-Nov-1960  MRN: [id number]  CC: Diabetes and Hypertension   Subjective: Jeremy [name] is a 57 y.o. male who presents for 1 mth f/u on urinary incontinence His concerns today include:  Pt with hx of DM, HL, HTN,GERD, chronic LBP, ED.  On last visit we changed lisinopril HCTZ to just lisinopril to see whether it was decreased incontinence issue.  We also started him on Flomax.  Patient reports no improvement.     Patient Active Problem List   Diagnosis Date Noted  . Benign prostatic hyperplasia with post-void dribbling 01/31/2018  . Obesity (BMI 30-39.9) 11/05/2017  . GERD (gastroesophageal reflux disease) 06/03/2017  . Chronic pain of both knees 06/03/2017  . Chronic lower back pain 06/03/2017  . Type 2 diabetes mellitus without complication, without long-term current use of insulin ([place]) 04/12/2016  . Essential hypertension 04/12/2016     Current Outpatient Medications on File Prior to Visit  Medication Sig Dispense Refill  . acetaminophen (TYLENOL) 500 MG tablet Take 500 mg by mouth 2 (two) times daily.    Marland Kitchen albuterol (PROVENTIL HFA;VENTOLIN HFA) 108 (90 Base) MCG/ACT inhaler Inhale 2 puffs into the lungs every 6 (six) hours as needed for wheezing or shortness of breath. 1 Inhaler 0  . atorvastatin (LIPITOR) 40 MG tablet Take 1 tablet (40 mg total) by mouth daily. 90 tablet 3  . Blood Glucose Monitoring Suppl (TRUE METRIX METER) w/Device KIT 1 each by Does not apply route 4 (four) times daily -  before meals and at bedtime. 1 kit 0  . chlorpheniramine-HYDROcodone (TUSSIONEX PENNKINETIC ER) 10-8 MG/5ML SUER Take 5 mLs by mouth every 12 (twelve) hours as needed for cough. (Patient not taking: Reported on 01/31/2018) 100 mL 0  . diclofenac sodium (VOLTAREN) 1 % GEL Apply 2 g topically 4 (four) times daily. (Patient not taking: Reported on 11/26/2017) 100 g 2  . diphenhydrAMINE (BENADRYL) 25 MG tablet Take 25 mg by mouth every 6 (six) hours as  needed for itching or allergies. Reported on 04/12/2016    . glipiZIDE (GLUCOTROL) 5 MG tablet Take 1 tablet (5 mg total) by mouth 2 (two) times daily before a meal. 60 tablet 6  . glucose blood (TRUE METRIX BLOOD GLUCOSE TEST) test strip Use as instructed 100 each 12  . lisinopril (PRINIVIL,ZESTRIL) 20 MG tablet Take 1 tablet (20 mg total) by mouth daily. Stop Lisinopril/HCTZ 90 tablet 3  . metFORMIN (GLUCOPHAGE) 1000 MG tablet Take 1 tablet (1,000 mg total) by mouth 2 (two) times daily with a meal. 180 tablet 3  . Na Sulfate-K Sulfate-Mg Sulf 17.5-3.13-1.6 GM/180ML SOLN Suprep (no substitutions)-TAKE AS DIRECTED.  Sample of this drug were given to the patient, quantity x 1 kit, Lot Number [id number] , exp.6/20 (Patient not taking: Reported on 11/26/2017) 354 mL 0  . naproxen sodium (ANAPROX) 220 MG tablet Take 440 mg by mouth 2 (two) times daily with a meal.    . omeprazole (PRILOSEC) 20 MG capsule Take 1 capsule (20 mg total) daily by mouth. 30 capsule 5  . OVER THE COUNTER MEDICATION Take 2 tablets by mouth at bedtime. "RESTLESS LEGS OTC"    . potassium chloride (K-DUR) 10 MEQ tablet Take 1 tablet (10 mEq total) by mouth daily. (Patient not taking: Reported on 01/31/2018) 30 tablet 2  . tamsulosin (FLOMAX) 0.4 MG CAPS capsule Take 1 capsule (0.4 mg total) by mouth daily. 90 capsule 3   No current facility-administered medications  on file prior to visit.     Allergies  Allergen Reactions  . Cymbalta [Duloxetine Hcl] Other (See Comments)    Caused anorgasmia    Social History   Socioeconomic History  . Marital status: Legally Separated    Spouse name: Not on file  . Number of children: Not on file  . Years of education: Not on file  . Highest education level: Not on file  Occupational History  . Occupation: Furniture conservator/restorer  . Financial resource strain: Not on file  . Food insecurity:    Worry: Not on file    Inability: Not on file  . Transportation needs:    Medical:  Not on file    Non-medical: Not on file  Tobacco Use  . Smoking status: Former Smoker    Last attempt to quit: 10/16/1988    Years since quitting: 29.4  . Smokeless tobacco: Never Used  Substance and Sexual Activity  . Alcohol use: No  . Drug use: No  . Sexual activity: Not on file  Lifestyle  . Physical activity:    Days per week: Not on file    Minutes per session: Not on file  . Stress: Not on file  Relationships  . Social connections:    Talks on phone: Not on file    Gets together: Not on file    Attends religious service: Not on file    Active member of club or organization: Not on file    Attends meetings of clubs or organizations: Not on file    Relationship status: Not on file  . Intimate partner violence:    Fear of current or ex partner: Not on file    Emotionally abused: Not on file    Physically abused: Not on file    Forced sexual activity: Not on file  Other Topics Concern  . Not on file  Social History Narrative   Volunteers as Airline pilot    Family History  Problem Relation Age of Onset  . Pancreatic cancer Mother   . CAD Father   . Colon cancer Paternal Grandfather     Past Surgical History:  Procedure Laterality Date  . BACK SURGERY  1993   lumbar  . CERVICAL SPINE SURGERY  2000  . HAND SURGERY      ROS: Review of Systems Negative except as stated above PHYSICAL EXAM: BP 134/78   Pulse 70   Temp 98.1 F (36.7 C) (Oral)   Resp 16   Wt 233 lb 12.8 oz (106.1 kg)   SpO2 96%   BMI 33.55 kg/m   Physical Exam  General appearance - alert, well appearing, and in no distress Mental status - normal mood, behavior, speech, dress, motor activity, and thought processes  Results for orders placed or performed in visit on 03/14/18  POCT glucose (manual entry)  Result Value Ref Range   POC Glucose 148 (A) 70 - 99 mg/dl    ASSESSMENT AND PLAN: 1. Other urinary incontinence - Ambulatory referral to Urology  2. Type 2 diabetes mellitus  without complication, without long-term current use of insulin (HCC) - POCT glucose (manual entry)  Patient was given the opportunity to ask questions.  Patient verbalized understanding of the plan and was able to repeat key elements of the plan.   Orders Placed This Encounter  Procedures  . Ambulatory referral to Urology  . POCT glucose (manual entry)     Requested Prescriptions    No prescriptions requested or  ordered in this encounter    Return in about 3 months (around 06/14/2018).  Karle Plumber, MD, FACP

## 2018-03-25 ENCOUNTER — Other Ambulatory Visit: Payer: Self-pay | Admitting: Internal Medicine

## 2018-03-25 DIAGNOSIS — K219 Gastro-esophageal reflux disease without esophagitis: Secondary | ICD-10-CM

## 2018-03-25 MED FILL — OMEPRAZOLE 20 MG CAP: 20 | 30 days supply | Qty: 30 | Fill #0

## 2018-04-08 MED FILL — ?ATORVASTATIN 40MG TABLET: 40 | 30 days supply | Qty: 30 | Fill #2

## 2018-04-08 MED FILL — glipiZIDE 5 MG TABS: 5 | 30 days supply | Qty: 60 | Fill #2

## 2018-04-08 MED FILL — TAMSULOSIN HCL 0.4 MG CAP: 0.4 | 30 days supply | Qty: 30 | Fill #2

## 2018-04-10 ENCOUNTER — Ambulatory Visit: Payer: Self-pay | Attending: Internal Medicine

## 2018-04-15 MED FILL — metFORMIN HCL 1000 MG TABS: 1000 | 30 days supply | Qty: 60 | Fill #3

## 2018-04-22 MED FILL — OMEPRAZOLE 20 MG CAP: 20 | 30 days supply | Qty: 30 | Fill #1

## 2018-04-22 MED FILL — LISINOPRIL 20 MG TAB: 20 | 30 days supply | Qty: 30 | Fill #2

## 2018-05-22 MED FILL — TAMSULOSIN HCL 0.4 MG CAP: 0.4 | 30 days supply | Qty: 30 | Fill #3

## 2018-05-22 MED FILL — ATORVASTATIN 40 MG TABLET: 40 | 30 days supply | Qty: 30 | Fill #3

## 2018-05-22 MED FILL — metFORMIN HCL 1000 MG TABS: 1000 | 30 days supply | Qty: 60 | Fill #4

## 2018-05-22 MED FILL — glipiZIDE 5 MG TABS: 5 | 30 days supply | Qty: 60 | Fill #1

## 2018-05-23 MED FILL — OMEPRAZOLE 20 MG CAP: 20 | 30 days supply | Qty: 30 | Fill #2

## 2018-05-30 MED FILL — LISINOPRIL 20 MG TAB: 20 | 30 days supply | Qty: 30 | Fill #3

## 2018-05-31 ENCOUNTER — Ambulatory Visit: Payer: Self-pay | Attending: Family Medicine

## 2018-06-14 ENCOUNTER — Ambulatory Visit: Payer: Self-pay | Attending: Internal Medicine | Admitting: Internal Medicine

## 2018-06-14 ENCOUNTER — Encounter: Payer: Self-pay | Admitting: Internal Medicine

## 2018-06-14 VITALS — BP 118/74 | HR 71 | Temp 98.3°F | Resp 16 | Wt 233.8 lb

## 2018-06-14 DIAGNOSIS — Z23 Encounter for immunization: Secondary | ICD-10-CM

## 2018-06-14 DIAGNOSIS — E119 Type 2 diabetes mellitus without complications: Secondary | ICD-10-CM

## 2018-06-14 DIAGNOSIS — E785 Hyperlipidemia, unspecified: Secondary | ICD-10-CM

## 2018-06-14 DIAGNOSIS — I10 Essential (primary) hypertension: Secondary | ICD-10-CM

## 2018-06-14 LAB — POCT GLYCOSYLATED HEMOGLOBIN (HGB A1C): HBA1C, POC (PREDIABETIC RANGE): 6.4 % (ref 5.7–6.4)

## 2018-06-14 LAB — GLUCOSE, POCT (MANUAL RESULT ENTRY): POC Glucose: 157 mg/dl — AB (ref 70–99)

## 2018-06-14 MED FILL — metFORMIN HCL 1000 MG TABS: 1000 | 30 days supply | Qty: 60 | Fill #5

## 2018-06-14 MED FILL — glipiZIDE 5 MG TABS: 5 | 30 days supply | Qty: 60 | Fill #2

## 2018-06-14 MED FILL — TAMSULOSIN HCL 0.4 MG CAP: 0.4 | 30 days supply | Qty: 30 | Fill #4

## 2018-06-14 MED FILL — ATORVASTATIN 40 MG TABLET: 40 | 30 days supply | Qty: 30 | Fill #4

## 2018-06-14 NOTE — Progress Notes (Signed)
Patient ID: Jeremy [name], male    DOB: 1960-12-20  MRN: [id number]  CC: Diabetes and Hypertension   Subjective: Jeremy [name] is a 57 y.o. male who presents for chronic ds management.  His concerns today include:  Pt with hx of DM, HL, HTN,GERD, chronic LBP, ED.  C/o cough x 1.5 wks.   Little congestion. No fever. Some rhinorrhea and sneezing.  Use to take Claritin but does not work any more. Taking a different OTC allergy pill.  Symptoms getting better.  DM:  Does not check BS Eating Habits:  Does okay during the day but once he gets home, he eats out of boredom Med:  Compliant with meds -metformin and glipizide Exercise: Does not set aside time for exercise but states that he is always on the go.    HTN:  Compliant with lisinopril and salt restriction.  Denies any chest pains or shortness of breath.  No lower extremity edema.    Patient Active Problem List   Diagnosis Date Noted  . Benign prostatic hyperplasia with post-void dribbling 01/31/2018  . Obesity (BMI 30-39.9) 11/05/2017  . GERD (gastroesophageal reflux disease) 06/03/2017  . Chronic pain of both knees 06/03/2017  . Chronic lower back pain 06/03/2017  . Type 2 diabetes mellitus without complication, without long-term current use of insulin ([place]) 04/12/2016  . Essential hypertension 04/12/2016     Current Outpatient Medications on File Prior to Visit  Medication Sig Dispense Refill  . acetaminophen (TYLENOL) 500 MG tablet Take 500 mg by mouth 2 (two) times daily.    Marland Kitchen albuterol (PROVENTIL HFA;VENTOLIN HFA) 108 (90 Base) MCG/ACT inhaler Inhale 2 puffs into the lungs every 6 (six) hours as needed for wheezing or shortness of breath. 1 Inhaler 0  . atorvastatin (LIPITOR) 40 MG tablet Take 1 tablet (40 mg total) by mouth daily. 90 tablet 3  . Blood Glucose Monitoring Suppl (TRUE METRIX METER) w/Device KIT 1 each by Does not apply route 4 (four) times daily -  before meals and at bedtime. 1 kit 0  .  chlorpheniramine-HYDROcodone (TUSSIONEX PENNKINETIC ER) 10-8 MG/5ML SUER Take 5 mLs by mouth every 12 (twelve) hours as needed for cough. (Patient not taking: Reported on 01/31/2018) 100 mL 0  . diclofenac sodium (VOLTAREN) 1 % GEL Apply 2 g topically 4 (four) times daily. (Patient not taking: Reported on 11/26/2017) 100 g 2  . diphenhydrAMINE (BENADRYL) 25 MG tablet Take 25 mg by mouth every 6 (six) hours as needed for itching or allergies. Reported on 04/12/2016    . glipiZIDE (GLUCOTROL) 5 MG tablet Take 1 tablet (5 mg total) by mouth 2 (two) times daily before a meal. 60 tablet 6  . glucose blood (TRUE METRIX BLOOD GLUCOSE TEST) test strip Use as instructed 100 each 12  . lisinopril (PRINIVIL,ZESTRIL) 20 MG tablet Take 1 tablet (20 mg total) by mouth daily. Stop Lisinopril/HCTZ 90 tablet 3  . metFORMIN (GLUCOPHAGE) 1000 MG tablet Take 1 tablet (1,000 mg total) by mouth 2 (two) times daily with a meal. 180 tablet 3  . Na Sulfate-K Sulfate-Mg Sulf 17.5-3.13-1.6 GM/180ML SOLN Suprep (no substitutions)-TAKE AS DIRECTED.  Sample of this drug were given to the patient, quantity x 1 kit, Lot Number [id number] , exp.6/20 (Patient not taking: Reported on 11/26/2017) 354 mL 0  . naproxen sodium (ANAPROX) 220 MG tablet Take 440 mg by mouth 2 (two) times daily with a meal.    . omeprazole (PRILOSEC) 20 MG capsule TAKE 1 CAPSULE BY  MOUTH DAILY. 30 capsule 5  . OVER THE COUNTER MEDICATION Take 2 tablets by mouth at bedtime. "RESTLESS LEGS OTC"    . potassium chloride (K-DUR) 10 MEQ tablet Take 1 tablet (10 mEq total) by mouth daily. (Patient not taking: Reported on 01/31/2018) 30 tablet 2  . tamsulosin (FLOMAX) 0.4 MG CAPS capsule Take 1 capsule (0.4 mg total) by mouth daily. 90 capsule 3   No current facility-administered medications on file prior to visit.     Allergies  Allergen Reactions  . Cymbalta [Duloxetine Hcl] Other (See Comments)    Caused anorgasmia    Social History   Socioeconomic History  .  Marital status: Legally Separated    Spouse name: Not on file  . Number of children: Not on file  . Years of education: Not on file  . Highest education level: Not on file  Occupational History  . Occupation: Furniture conservator/restorer  . Financial resource strain: Not on file  . Food insecurity:    Worry: Not on file    Inability: Not on file  . Transportation needs:    Medical: Not on file    Non-medical: Not on file  Tobacco Use  . Smoking status: Former Smoker    Last attempt to quit: 10/16/1988    Years since quitting: 29.6  . Smokeless tobacco: Never Used  Substance and Sexual Activity  . Alcohol use: No  . Drug use: No  . Sexual activity: Not on file  Lifestyle  . Physical activity:    Days per week: Not on file    Minutes per session: Not on file  . Stress: Not on file  Relationships  . Social connections:    Talks on phone: Not on file    Gets together: Not on file    Attends religious service: Not on file    Active member of club or organization: Not on file    Attends meetings of clubs or organizations: Not on file    Relationship status: Not on file  . Intimate partner violence:    Fear of current or ex partner: Not on file    Emotionally abused: Not on file    Physically abused: Not on file    Forced sexual activity: Not on file  Other Topics Concern  . Not on file  Social History Narrative   Volunteers as Airline pilot    Family History  Problem Relation Age of Onset  . Pancreatic cancer Mother   . CAD Father   . Colon cancer Paternal Grandfather     Past Surgical History:  Procedure Laterality Date  . BACK SURGERY  1993   lumbar  . CERVICAL SPINE SURGERY  2000  . HAND SURGERY      ROS: Review of Systems Negative except as stated above.  PHYSICAL EXAM: BP 118/74   Pulse 71   Temp 98.3 F (36.8 C) (Oral)   Resp 16   Wt 233 lb 12.8 oz (106.1 kg)   SpO2 98%   BMI 33.55 kg/m   Physical Exam  General appearance - alert, well  appearing, and in no distress Mental status - normal mood, behavior, speech, dress, motor activity, and thought processes Eyes - pupils equal and reactive, extraocular eye movements intact Mouth - mucous membranes moist, pharynx normal without lesions Neck - supple, no significant adenopathy Chest - clear to auscultation, no wheezes, rales or rhonchi, symmetric air entry Heart - normal rate, regular rhythm, normal S1, S2, no murmurs,  rubs, clicks or gallops Extremities - peripheral pulses normal, no pedal edema, no clubbing or cyanosis  A1C 6.4/BS 157.  Lab Results  Component Value Date   WBC 6.4 11/12/2017   HGB 13.2 11/12/2017   HCT 39.4 11/12/2017   MCV 89 11/12/2017   PLT 201 11/12/2017     Chemistry      Component Value Date/Time   NA 144 11/12/2017 1625   K 4.3 11/12/2017 1625   CL 105 11/12/2017 1625   CO2 25 11/12/2017 1625   BUN 16 11/12/2017 1625   CREATININE 0.70 (L) 11/12/2017 1625   CREATININE 0.72 10/17/2016 1012      Component Value Date/Time   CALCIUM 9.4 11/12/2017 1625   ALKPHOS 53 11/12/2017 1625   AST 32 11/12/2017 1625   ALT 43 11/12/2017 1625   BILITOT 0.2 11/12/2017 1625      ASSESSMENT AND PLAN: 1. Type 2 diabetes mellitus without complication, without long-term current use of insulin (HCC) Well-controlled.  Commended him on his A1c dropping 2 points.  Continue to encourage healthy eating habits.  Encourage him to get in some form of physical activity at least 3 to 4 days a week for 30 minutes. - POCT glucose (manual entry) - POCT glycosylated hemoglobin (Hb A1C)  2. Essential hypertension At goal.  Continue lisinopril.  3. Hyperlipidemia, unspecified hyperlipidemia type Continue atorvastatin.  4. Need for immunization against influenza - Flu Vaccine QUAD 36+ mos IM   Patient was given the opportunity to ask questions.  Patient verbalized understanding of the plan and was able to repeat key elements of the plan.   Orders Placed This  Encounter  Procedures  . Flu Vaccine QUAD 36+ mos IM  . POCT glucose (manual entry)  . POCT glycosylated hemoglobin (Hb A1C)     Requested Prescriptions    No prescriptions requested or ordered in this encounter    Return in about 4 months (around 10/14/2018).  Karle Plumber, MD, FACP

## 2018-06-14 NOTE — Patient Instructions (Signed)

## 2018-06-24 MED FILL — OMEPRAZOLE 20 MG CAP: 20 | 30 days supply | Qty: 30 | Fill #3

## 2018-06-26 ENCOUNTER — Encounter: Payer: Self-pay | Admitting: Internal Medicine

## 2018-06-30 ENCOUNTER — Encounter: Payer: Self-pay | Admitting: Internal Medicine

## 2018-07-01 MED FILL — LISINOPRIL 20 MG TAB: 20 | 30 days supply | Qty: 30 | Fill #4

## 2018-07-05 ENCOUNTER — Encounter: Payer: Self-pay | Admitting: Internal Medicine

## 2018-07-08 ENCOUNTER — Ambulatory Visit: Payer: Self-pay | Attending: Family Medicine | Admitting: Family Medicine

## 2018-07-08 ENCOUNTER — Other Ambulatory Visit: Payer: Self-pay

## 2018-07-08 VITALS — BP 139/75 | HR 62 | Temp 98.4°F | Resp 16 | Wt 232.4 lb

## 2018-07-08 DIAGNOSIS — R197 Diarrhea, unspecified: Secondary | ICD-10-CM | POA: Insufficient documentation

## 2018-07-08 DIAGNOSIS — M25562 Pain in left knee: Secondary | ICD-10-CM | POA: Insufficient documentation

## 2018-07-08 DIAGNOSIS — E1165 Type 2 diabetes mellitus with hyperglycemia: Secondary | ICD-10-CM | POA: Insufficient documentation

## 2018-07-08 DIAGNOSIS — Z79899 Other long term (current) drug therapy: Secondary | ICD-10-CM | POA: Insufficient documentation

## 2018-07-08 DIAGNOSIS — Z7984 Long term (current) use of oral hypoglycemic drugs: Secondary | ICD-10-CM | POA: Insufficient documentation

## 2018-07-08 DIAGNOSIS — N3943 Post-void dribbling: Secondary | ICD-10-CM | POA: Insufficient documentation

## 2018-07-08 DIAGNOSIS — Z87891 Personal history of nicotine dependence: Secondary | ICD-10-CM | POA: Insufficient documentation

## 2018-07-08 DIAGNOSIS — G8929 Other chronic pain: Secondary | ICD-10-CM | POA: Insufficient documentation

## 2018-07-08 DIAGNOSIS — N401 Enlarged prostate with lower urinary tract symptoms: Secondary | ICD-10-CM | POA: Insufficient documentation

## 2018-07-08 DIAGNOSIS — K2100 Gastro-esophageal reflux disease with esophagitis, without bleeding: Secondary | ICD-10-CM

## 2018-07-08 DIAGNOSIS — Z6839 Body mass index (BMI) 39.0-39.9, adult: Secondary | ICD-10-CM | POA: Insufficient documentation

## 2018-07-08 DIAGNOSIS — E669 Obesity, unspecified: Secondary | ICD-10-CM | POA: Insufficient documentation

## 2018-07-08 DIAGNOSIS — R0789 Other chest pain: Secondary | ICD-10-CM | POA: Insufficient documentation

## 2018-07-08 DIAGNOSIS — R112 Nausea with vomiting, unspecified: Secondary | ICD-10-CM

## 2018-07-08 DIAGNOSIS — I10 Essential (primary) hypertension: Secondary | ICD-10-CM | POA: Insufficient documentation

## 2018-07-08 DIAGNOSIS — M25561 Pain in right knee: Secondary | ICD-10-CM | POA: Insufficient documentation

## 2018-07-08 LAB — GLUCOSE, POCT (MANUAL RESULT ENTRY): POC Glucose: 224 mg/dl — AB (ref 70–99)

## 2018-07-08 MED ORDER — TAMSULOSIN HCL 0.4 MG PO CAPS: 0.4000 mg | ORAL_CAPSULE | Freq: Every day | ORAL | 3 refills | Status: DC

## 2018-07-08 MED ORDER — PANTOPRAZOLE SODIUM 40 MG PO TBEC: 40.0000 mg | DELAYED_RELEASE_TABLET | Freq: Every day | ORAL | 0 refills | Status: DC

## 2018-07-08 MED FILL — ?TAMSULOSIN HCL 0.4 MG CAP: 0.4 | 30 days supply | Qty: 30 | Fill #0

## 2018-07-08 MED FILL — ?PANTOPRAZOLE SO DR 40MG TA: 40 | 30 days supply | Qty: 30 | Fill #0

## 2018-07-08 NOTE — Progress Notes (Signed)
Patient ID: Jeremy [name], male    DOB: 1961/06/09, 57 y.o.   MRN: [id number]  PCP: [name], MD  Chief Complaint  Patient presents with  . Diarrhea  . Emesis    Subjective:  HPI Jeremy [name] is a 57 y.o. male presents for evaluation diarrhea, nausea, and vomiting intermittently.  He has Type 2 diabetes mellitus without complication, without long-term current use of insulin ([place]); Essential hypertension; GERD (gastroesophageal reflux disease); Chronic pain of both knees; Chronic lower back pain; Obesity (BMI 30-39.9); and Benign prostatic hyperplasia with post-void dribbling on their problem list.  Jeremy [name] describes several weeks of abrupt onset of acid reflux and indigestions symptoms occurring approximately 2 hours after lying down.  Patient reports a long history of GERD and has taken omeprazole chronically for two years but feels his symptoms are non-responsive to medication at this time. Symptoms include chest burning, chest discomfort, throat burning, emesis which causes burning in throat, gas, and persistent belching. Admits to a poor diet although notes symptoms worsen with intake of tomato based foods.  He drinks minimal water and reports hydration obtained from sweet tea and soda. He suffers from T2DM which has been well controlled as of recent. No routine monitoring of blood sugar but check last night reading was 147. He felt symptoms was related to medications and completely stopped taking 3 days ago which did not resolve symptoms. Last A1C  6.4.  He denies SOB, headaches, weakness, abdominal pain, rectal bleeding, or hemoptysis.  Social History   Socioeconomic History  . Marital status: Significant Other    Spouse name: Not on file  . Number of children: Not on file  . Years of education: Not on file  . Highest education level: Not on file  Occupational History  . Occupation: Furniture conservator/restorer  . Financial resource strain: Not on file  . Food  insecurity:    Worry: Not on file    Inability: Not on file  . Transportation needs:    Medical: Not on file    Non-medical: Not on file  Tobacco Use  . Smoking status: Former Smoker    Last attempt to quit: 10/16/1988    Years since quitting: 29.7  . Smokeless tobacco: Never Used  Substance and Sexual Activity  . Alcohol use: No  . Drug use: No  . Sexual activity: Not on file  Lifestyle  . Physical activity:    Days per week: Not on file    Minutes per session: Not on file  . Stress: Not on file  Relationships  . Social connections:    Talks on phone: Not on file    Gets together: Not on file    Attends religious service: Not on file    Active member of club or organization: Not on file    Attends meetings of clubs or organizations: Not on file    Relationship status: Not on file  . Intimate partner violence:    Fear of current or ex partner: Not on file    Emotionally abused: Not on file    Physically abused: Not on file    Forced sexual activity: Not on file  Other Topics Concern  . Not on file  Social History Narrative   Volunteers as Airline pilot    Family History  Problem Relation Age of Onset  . Pancreatic cancer Mother   . CAD Father   . Colon cancer Paternal Grandfather    Review of Systems  HENT:       Burning of throat   Respiratory:       Chest burning   Gastrointestinal: Positive for diarrhea, nausea and vomiting.    Patient Active Problem List   Diagnosis Date Noted  . Benign prostatic hyperplasia with post-void dribbling 01/31/2018  . Obesity (BMI 30-39.9) 11/05/2017  . GERD (gastroesophageal reflux disease) 06/03/2017  . Chronic pain of both knees 06/03/2017  . Chronic lower back pain 06/03/2017  . Type 2 diabetes mellitus without complication, without long-term current use of insulin ([place]) 04/12/2016  . Essential hypertension 04/12/2016    Allergies  Allergen Reactions  . Cymbalta [Duloxetine Hcl] Other (See Comments)    Caused  anorgasmia    Prior to Admission medications   Medication Sig Start Date End Date Taking? Authorizing Provider  acetaminophen (TYLENOL) 500 MG tablet Take 500 mg by mouth 2 (two) times daily.    [provider]  albuterol (PROVENTIL HFA;VENTOLIN HFA) 108 (90 Base) MCG/ACT inhaler Inhale 2 puffs into the lungs every 6 (six) hours as needed for wheezing or shortness of breath. 11/26/17   [name], MD  atorvastatin (LIPITOR) 40 MG tablet Take 1 tablet (40 mg total) by mouth daily. 01/31/18   [name], MD  Blood Glucose Monitoring Suppl (TRUE METRIX METER) w/Device KIT 1 each by Does not apply route 4 (four) times daily -  before meals and at bedtime. 04/12/16   Elsie Stain, MD  chlorpheniramine-HYDROcodone (TUSSIONEX PENNKINETIC ER) 10-8 MG/5ML SUER Take 5 mLs by mouth every 12 (twelve) hours as needed for cough. Patient not taking: Reported on 01/31/2018 11/26/17   [name], MD  diclofenac sodium (VOLTAREN) 1 % GEL Apply 2 g topically 4 (four) times daily. Patient not taking: Reported on 11/26/2017 04/13/17   [name], MD  diphenhydrAMINE (BENADRYL) 25 MG tablet Take 25 mg by mouth every 6 (six) hours as needed for itching or allergies. Reported on 04/12/2016    [provider]  glipiZIDE (GLUCOTROL) 5 MG tablet Take 1 tablet (5 mg total) by mouth 2 (two) times daily before a meal. 01/31/18   [name], MD  glucose blood (TRUE METRIX BLOOD GLUCOSE TEST) test strip Use as instructed 04/12/16   Elsie Stain, MD  lisinopril (PRINIVIL,ZESTRIL) 20 MG tablet Take 1 tablet (20 mg total) by mouth daily. Stop Lisinopril/HCTZ 01/31/18   [name], MD  metFORMIN (GLUCOPHAGE) 1000 MG tablet Take 1 tablet (1,000 mg total) by mouth 2 (two) times daily with a meal. 01/02/18   [name], MD  Na Sulfate-K Sulfate-Mg Sulf 17.5-3.13-1.6 GM/180ML SOLN Suprep (no substitutions)-TAKE AS DIRECTED.  Sample of this drug were given to the  patient, quantity x 1 kit, Lot Number [id number] , exp.6/20 Patient not taking: Reported on 11/26/2017 06/11/17   Yetta Flock, MD  naproxen sodium (ANAPROX) 220 MG tablet Take 440 mg by mouth 2 (two) times daily with a meal.    [provider]  omeprazole (PRILOSEC) 20 MG capsule TAKE 1 CAPSULE BY MOUTH DAILY. 03/25/18   [name], MD  OVER THE COUNTER MEDICATION Take 2 tablets by mouth at bedtime. "RESTLESS LEGS OTC"    [provider]  potassium chloride (K-DUR) 10 MEQ tablet Take 1 tablet (10 mEq total) by mouth daily. Patient not taking: Reported on 01/31/2018 01/09/17   Maren Reamer, MD  tamsulosin (FLOMAX) 0.4 MG CAPS capsule Take 1 capsule (0.4 mg total) by mouth daily.  01/31/18   [name], MD    Past Medical, Surgical Family and Social History reviewed and updated.    Objective:  There were no vitals filed for this visit.  Wt Readings from Last 3 Encounters:  06/14/18 233 lb 12.8 oz (106.1 kg)  03/14/18 233 lb 12.8 oz (106.1 kg)  01/31/18 229 lb (103.9 kg)    Physical Exam BP 139/75   Pulse 62   Temp 98.4 F (36.9 C) (Oral)   Resp 16   Wt 232 lb 6.4 oz (105.4 kg)   SpO2 96%   BMI 33.35 kg/m   General Appearance:    Alert, cooperative, no distress, appears stated age  Head:    Normocephalic, without obvious abnormality, atraumatic  Throat:   Lips, mucosa, and tongue normal; oropharynx normal   Neck:   Supple, symmetrical, trachea midline, no adenopathy;       thyroid:  No enlargement/tenderness/nodules; no carotid   bruit or JVD  Lungs:     Clear to auscultation bilaterally, respirations unlabored  Chest wall:    No tenderness or deformity  Heart:    Regular rate and rhythm, S1 and S2 normal, no murmur, rub   or gallop  Abdomen:     Soft, non-tender, non-distended,  bowel sounds active all four quadrants,    no masses, no organomegaly  Pulses:   2+ and symmetric all extremities  Skin:   Skin color, texture, turgor normal,  no rashes or lesions   Assessment & Plan:  1. Type 2 diabetes mellitus with hyperglycemia, unspecified whether long term insulin use ([place]), uncontrolled today.BG today 224 (non-fasting) although markedly elevated. Patient discontinued medication 72 hours ago due to GI symptoms which did not resolve with discontinuation of medication. -resume medications as previously prescribed -check blood sugar at least once daily (fasting preferably) or check when feeling poorly -reinforced the importance of improving dietary choices in efforts to better improve glycemic control -patient agreed to start by decreasing consumption of sugary beverages and replacing with water or sugar free beverage.  2. Chest discomfort, new Risk factors for MI include positive family history of cardiovascular disease, T2DM, OSA, and history of smoking (quit). EKG today was NSR, negative of ST changes or concern for ischemia. Educated patient to follow-up if chest discomfort continues or doesn't completely resolve with treatment and resolution of acid reflux symptoms.  Patient verbalized understanding and agreement with plan.  3. Nausea and vomiting, intractability of vomiting not specified, unspecified vomiting type-not active today  4. Diarrhea, unspecified type, not active today Patient is currently taking long-term chronic low-dose potassium. Will check potassium level to rule out elevated or low potassium as cause of current symptoms. - Potassium, pending   5. Benign prostatic hyperplasia with post-void dribbling, patient out of medication. Refilled per request. -stable tamsulosin (FLOMAX) 0.4 MG CAPS capsule; Take 1 capsule (0.4 mg total) by mouth daily.  Dispense: 90 capsule; Refill: 3  6. Gastroesophageal reflux disease with esophagitis, currently uncontrolled. Will discontinue omeprazole today and trial pantoprazole 40 mg once daily. Patient advised to avoid foods that known to worsen his symptoms.  Eat at least 4-5  hours prior to bedtime. Encouraged water consumption.  If symptoms worsen or do not improve, return for follow-up, follow-up with PCP, or at the emergency department if severity of symptoms warrant a higher level of care.    Jeremy Sage. Kenton Kingfisher, MSN, [facility], [place], [place] [id number] [phone]

## 2018-07-08 NOTE — Patient Instructions (Signed)
Discontinue omeprazole. Start Protonix 40 mg once daily for treatment of acid reflux and indigestion symptoms. Please schedule a follow-up with Dr. Laural Benes in 6 weeks for follow-up on the effectiveness of changing medication for management of acid reflux symptoms.   I encourage you to resume all other medications. Your blood sugar was elevated today which may also contribute to the nausea, diarrhea you have experienced intermittently.  Make efforts to reduce intake of sugary beverages as this will potentially worsen you overall diabetes control long-term. Make efforts to check you blood sugar daily fasting in the morning and check anytime you are feeling poorly. If blood sugars are consistently greater than 200, notify us here in the office.  Your EKG today is completely normal. If the chest discomfort continues or doesn't improve return here for evaluation or go immediately to the ER for further work-up and evaluation.  I have filled your tamulosin which is used to manage BPH.     Hyperglycemia Hyperglycemia is when the sugar (glucose) level in your blood is too high. It may not cause symptoms. If you do have symptoms, they may include warning signs, such as:  Feeling more thirsty than normal.  Hunger.  Feeling tired.  Needing to pee (urinate) more than normal.  Blurry eyesight (vision).  You may get other symptoms as it gets worse, such as:  Dry mouth.  Not being hungry (loss of appetite).  Fruity-smelling breath.  Weakness.  Weight gain or loss that is not planned. Weight loss may be fast.  A tingling or numb feeling in your hands or feet.  Headache.  Skin that does not bounce back quickly when it is lightly pinched and released (poor skin turgor).  Pain in your belly (abdomen).  Cuts or bruises that heal slowly.  High blood sugar can happen to people who do or do not have diabetes. High blood sugar can happen slowly or quickly, and it can be an emergency. Follow  these instructions at home: General instructions  Take over-the-counter and prescription medicines only as told by your doctor.  Do not use products that contain nicotine or tobacco, such as cigarettes and e-cigarettes. If you need help quitting, ask your doctor.  Limit alcohol intake to no more than 1 drink per day for nonpregnant women and 2 drinks per day for men. One drink equals 12 oz of beer, 5 oz of wine, or 1 oz of hard liquor.  Manage stress. If you need help with this, ask your doctor.  Keep all follow-up visits as told by your doctor. This is important. Eating and drinking  Stay at a healthy weight.  Exercise regularly, as told by your doctor.  Drink enough fluid, especially when you: ? Exercise. ? Get sick. ? Are in hot temperatures.  Eat healthy foods, such as: ? Low-fat (lean) proteins. ? Complex carbs (complex carbohydrates), such as whole wheat bread or brown rice. ? Fresh fruits and vegetables. ? Low-fat dairy products. ? Healthy fats.  Drink enough fluid to keep your pee (urine) clear or pale yellow. If you have diabetes:  Make sure you know the symptoms of hyperglycemia.  Follow your diabetes management plan, as told by your doctor. Make sure you: ? Take insulin and medicines as told. ? Follow your exercise plan. ? Follow your meal plan. Eat on time. Do not skip meals. ? Check your blood sugar as often as told. Make sure to check before and after exercise. If you exercise longer or in a different way  than you normally do, check your blood sugar more often. ? Follow your sick day plan whenever you cannot eat or drink normally. Make this plan ahead of time with your doctor.  Share your diabetes management plan with people in your workplace, school, and household.  Check your urine for ketones when you are ill and as told by your doctor.  Carry a card or wear jewelry that says that you have diabetes. Contact a doctor if:  Your blood sugar level is  higher than 240 mg/dL (16.113.3 mmol/L) for 2 days in a row.  You have problems keeping your blood sugar in your target range.  High blood sugar happens often for you. Get help right away if:  You have trouble breathing.  You have a change in how you think, feel, or act (mental status).  You feel sick to your stomach (nauseous), and that feeling does not go away.  You cannot stop throwing up (vomiting). These symptoms may be an emergency. Do not wait to see if the symptoms will go away. Get medical help right away. Call your local emergency services (911 in the [place] Do not drive yourself to the hospital. Summary  Hyperglycemia is when the sugar (glucose) level in your blood is too high.  High blood sugar can happen to people who do or do not have diabetes.  Make sure you drink enough fluids, eat healthy foods, and exercise regularly.  Contact your doctor if you have problems keeping your blood sugar in your target range. This information is not intended to replace advice given to you by your health care provider. Make sure you discuss any questions you have with your health care provider. Document Released: 07/30/2009 Document Revised: 06/19/2016 Document Reviewed: 06/19/2016 Elsevier Interactive Patient Education  2017 [employer]   Nonspecific Chest Pain Chest pain can be caused by many different conditions. There is a chance that your pain could be related to something serious, such as a heart attack or a blood clot in your lungs. Chest pain can also be caused by conditions that are not life-threatening. If you have chest pain, it is very important to follow up with your doctor. Follow these instructions at home: Medicines  If you were prescribed an antibiotic medicine, take it as told by your doctor. Do not stop taking the antibiotic even if you start to feel better.  Take over-the-counter and prescription medicines only as told by your doctor. Lifestyle  Do not use any  products that contain nicotine or tobacco, such as cigarettes and e-cigarettes. If you need help quitting, ask your doctor.  Do not drink alcohol.  Make lifestyle changes as told by your doctor. These may include: ? Getting regular exercise. Ask your doctor for some activities that are safe for you. ? Eating a heart-healthy diet. A diet specialist (dietitian) can help you to learn healthy eating options. ? Staying at a healthy weight. ? Managing diabetes, if needed. ? Lowering your stress, as with deep breathing or spending time in nature. General instructions  Avoid any activities that make you feel chest pain.  If your chest pain is because of heartburn: ? Raise (elevate) the head of your bed about 6 inches (15 cm). You can do this by putting blocks under the bed legs at the head of the bed. ? Do not sleep with extra pillows under your head. That does not help heartburn.  Keep all follow-up visits as told by your doctor. This is important. This includes any  further testing if your chest pain does not go away. Contact a doctor if:  Your chest pain does not go away.  You have a rash with blisters on your chest.  You have a fever.  You have chills. Get help right away if:  Your chest pain is worse.  You have a cough that gets worse, or you cough up blood.  You have very bad (severe) pain in your belly (abdomen).  You are very weak.  You pass out (faint).  You have either of these for no clear reason: ? Sudden chest discomfort. ? Sudden discomfort in your arms, back, neck, or jaw.  You have shortness of breath at any time.  You suddenly start to sweat, or your skin gets clammy.  You feel sick to your stomach (nauseous).  You throw up (vomit).  You suddenly feel light-headed or dizzy.  Your heart starts to beat fast, or it feels like it is skipping beats. These symptoms may be an emergency. Do not wait to see if the symptoms will go away. Get medical help right  away. Call your local emergency services (911 in the [place] Do not drive yourself to the hospital. This information is not intended to replace advice given to you by your health care provider. Make sure you discuss any questions you have with your health care provider. Document Released: 03/20/2008 Document Revised: 06/26/2016 Document Reviewed: 06/26/2016 Elsevier Interactive Patient Education  2017 [employer].      Food Choices for Gastroesophageal Reflux Disease, Adult When you have gastroesophageal reflux disease (GERD), the foods you eat and your eating habits are very important. Choosing the right foods can help ease your discomfort. What guidelines do I need to follow?  Choose fruits, vegetables, whole grains, and low-fat dairy products.  Choose low-fat meat, fish, and poultry.  Limit fats such as oils, salad dressings, butter, nuts, and avocado.  Keep a food diary. This helps you identify foods that cause symptoms.  Avoid foods that cause symptoms. These may be different for everyone.  Eat small meals often instead of 3 large meals a day.  Eat your meals slowly, in a place where you are relaxed.  Limit fried foods.  Cook foods using methods other than frying.  Avoid drinking alcohol.  Avoid drinking large amounts of liquids with your meals.  Avoid bending over or lying down until 2-3 hours after eating. What foods are not recommended? These are some foods and drinks that may make your symptoms worse: Vegetables Tomatoes. Tomato juice. Tomato and spaghetti sauce. Chili peppers. Onion and garlic. Horseradish. Fruits Oranges, grapefruit, and lemon (fruit and juice). Meats High-fat meats, fish, and poultry. This includes hot dogs, ribs, ham, sausage, salami, and bacon. Dairy Whole milk and chocolate milk. Sour cream. Cream. Butter. Ice cream. Cream cheese. Drinks Coffee and tea. Bubbly (carbonated) drinks or energy drinks. Condiments Hot sauce. Barbecue  sauce. Sweets/Desserts Chocolate and cocoa. Donuts. Peppermint and spearmint. Fats and Oils High-fat foods. This includes [place] fries and potato chips. Other Vinegar. Strong spices. This includes black pepper, white pepper, red pepper, cayenne, curry powder, cloves, ginger, and chili powder. The items listed above may not be a complete list of foods and drinks to avoid. Contact your dietitian for more information. This information is not intended to replace advice given to you by your health care provider. Make sure you discuss any questions you have with your health care provider. Document Released: 04/02/2012 Document Revised: 03/09/2016 Document Reviewed: 08/06/2013 Elsevier Interactive Patient  Education  2017 Elsevier Inc.  

## 2018-07-09 ENCOUNTER — Encounter: Payer: Self-pay | Admitting: Family Medicine

## 2018-07-09 LAB — POTASSIUM: Potassium: 4.1 mmol/L (ref 3.5–5.2)

## 2018-07-10 ENCOUNTER — Ambulatory Visit: Payer: Self-pay

## 2018-07-24 MED FILL — ?METFORMIN HCL 1000MG TABS: 1000 | 30 days supply | Qty: 60 | Fill #6

## 2018-07-24 MED FILL — TAMSULOSIN HCL 0.4 MG CAP: 0.4 | 30 days supply | Qty: 30 | Fill #5

## 2018-07-24 MED FILL — ?ATORVASTATIN 40MG TABLET: 40 | 30 days supply | Qty: 30 | Fill #5

## 2018-07-30 MED FILL — LISINOPRIL 20 MG TAB: 20 | 30 days supply | Qty: 30 | Fill #5

## 2018-08-05 MED FILL — ?PANTOPRAZOLE SO DR 40MG TA: 40 | 30 days supply | Qty: 30 | Fill #1

## 2018-08-19 ENCOUNTER — Encounter: Payer: Self-pay | Admitting: Internal Medicine

## 2018-08-19 ENCOUNTER — Ambulatory Visit: Payer: Self-pay | Attending: Internal Medicine | Admitting: Internal Medicine

## 2018-08-19 VITALS — BP 144/78 | HR 70 | Temp 97.9°F | Resp 16 | Wt 232.8 lb

## 2018-08-19 DIAGNOSIS — Z79899 Other long term (current) drug therapy: Secondary | ICD-10-CM | POA: Insufficient documentation

## 2018-08-19 DIAGNOSIS — R32 Unspecified urinary incontinence: Secondary | ICD-10-CM | POA: Insufficient documentation

## 2018-08-19 DIAGNOSIS — E669 Obesity, unspecified: Secondary | ICD-10-CM | POA: Insufficient documentation

## 2018-08-19 DIAGNOSIS — Z888 Allergy status to other drugs, medicaments and biological substances status: Secondary | ICD-10-CM | POA: Insufficient documentation

## 2018-08-19 DIAGNOSIS — R1310 Dysphagia, unspecified: Secondary | ICD-10-CM

## 2018-08-19 DIAGNOSIS — Z87891 Personal history of nicotine dependence: Secondary | ICD-10-CM | POA: Insufficient documentation

## 2018-08-19 DIAGNOSIS — K219 Gastro-esophageal reflux disease without esophagitis: Secondary | ICD-10-CM

## 2018-08-19 DIAGNOSIS — E119 Type 2 diabetes mellitus without complications: Secondary | ICD-10-CM

## 2018-08-19 DIAGNOSIS — Z7984 Long term (current) use of oral hypoglycemic drugs: Secondary | ICD-10-CM | POA: Insufficient documentation

## 2018-08-19 DIAGNOSIS — R1319 Other dysphagia: Secondary | ICD-10-CM

## 2018-08-19 DIAGNOSIS — I10 Essential (primary) hypertension: Secondary | ICD-10-CM | POA: Insufficient documentation

## 2018-08-19 DIAGNOSIS — Z6833 Body mass index (BMI) 33.0-33.9, adult: Secondary | ICD-10-CM | POA: Insufficient documentation

## 2018-08-19 DIAGNOSIS — N400 Enlarged prostate without lower urinary tract symptoms: Secondary | ICD-10-CM | POA: Insufficient documentation

## 2018-08-19 DIAGNOSIS — N39498 Other specified urinary incontinence: Secondary | ICD-10-CM

## 2018-08-19 LAB — GLUCOSE, POCT (MANUAL RESULT ENTRY): POC GLUCOSE: 107 mg/dL — AB (ref 70–99)

## 2018-08-19 MED ORDER — FAMOTIDINE 20 MG PO TABS: 20.0000 mg | ORAL_TABLET | Freq: Every day | ORAL | 6 refills | Status: DC

## 2018-08-19 MED FILL — FAMOTIDINE 20 MG TABLET: 20 | 30 days supply | Qty: 30 | Fill #0

## 2018-08-19 NOTE — Progress Notes (Signed)
Pt states his right wrist has been hurting as well

## 2018-08-19 NOTE — Progress Notes (Signed)
Patient ID: Jeremy [name], male    DOB: Sep 11, 1961  MRN: [id number]  CC: Follow-up (6 week)   Subjective: Jeremy [name] is a 57 y.o. male who presents for f/u GERd symptoms His concerns today include:  Pt with hx of DM, HL, HTN,GERD, chronic LBP, ED.  Patient seen 6 weeks ago by NP for acid reflux symptoms.  Counseling was given and omeprazole was changed to pantoprazole. Since then patient reports that he was doing good until 1 week ago when he started having symptoms again.  Symptoms occur only at night times.  He eats his last meal anywhere from 1 to 3 hours before laying down at nights.  He sleeps on 2 pillows. -Reports waking up about 3 hours after going to bed with stomach pains and burning in the chest, back of the throat and into the nostrils.  He has thrown up once or twice with these episodes.  Goes away by eating a slice of bread and drinking Dr. Malachi Bonds or tea.  He takes Aleve 440 mg twice a day for his back.  He takes the pantoprazole in the evenings. Also reports feeling of food being stuck in the chest when he eats x 1-2 wks -He does not have any of these symptoms during the day when he is up and active.  Denies any chest pains or shortness of breath on exertion.  Referred to urology in May of this yr but did not have the orange card or cone discount card at the time.  He now has it and would like to move forward with the referral.  Patient was having some incontinence with post void dribbling.  Intermittently he can be sitting down and feels a small amount of leakage that stains his underwear.  We had stopped HCTZ and placed him on Flomax which did not help. Patient Active Problem List   Diagnosis Date Noted  . Benign prostatic hyperplasia with post-void dribbling 01/31/2018  . Obesity (BMI 30-39.9) 11/05/2017  . GERD (gastroesophageal reflux disease) 06/03/2017  . Chronic pain of both knees 06/03/2017  . Chronic lower back pain 06/03/2017  . Type 2 diabetes mellitus  without complication, without long-term current use of insulin ([place]) 04/12/2016  . Essential hypertension 04/12/2016     Current Outpatient Medications on File Prior to Visit  Medication Sig Dispense Refill  . acetaminophen (TYLENOL) 500 MG tablet Take 500 mg by mouth 2 (two) times daily.    Marland Kitchen albuterol (PROVENTIL HFA;VENTOLIN HFA) 108 (90 Base) MCG/ACT inhaler Inhale 2 puffs into the lungs every 6 (six) hours as needed for wheezing or shortness of breath. (Patient not taking: Reported on 08/19/2018) 1 Inhaler 0  . atorvastatin (LIPITOR) 40 MG tablet Take 1 tablet (40 mg total) by mouth daily. 90 tablet 3  . Blood Glucose Monitoring Suppl (TRUE METRIX METER) w/Device KIT 1 each by Does not apply route 4 (four) times daily -  before meals and at bedtime. 1 kit 0  . chlorpheniramine-HYDROcodone (TUSSIONEX PENNKINETIC ER) 10-8 MG/5ML SUER Take 5 mLs by mouth every 12 (twelve) hours as needed for cough. (Patient not taking: Reported on 01/31/2018) 100 mL 0  . diclofenac sodium (VOLTAREN) 1 % GEL Apply 2 g topically 4 (four) times daily. (Patient not taking: Reported on 11/26/2017) 100 g 2  . diphenhydrAMINE (BENADRYL) 25 MG tablet Take 25 mg by mouth every 6 (six) hours as needed for itching or allergies. Reported on 04/12/2016    . glipiZIDE (GLUCOTROL) 5 MG tablet  Take 1 tablet (5 mg total) by mouth 2 (two) times daily before a meal. 60 tablet 6  . glucose blood (TRUE METRIX BLOOD GLUCOSE TEST) test strip Use as instructed 100 each 12  . lisinopril (PRINIVIL,ZESTRIL) 20 MG tablet Take 1 tablet (20 mg total) by mouth daily. Stop Lisinopril/HCTZ 90 tablet 3  . metFORMIN (GLUCOPHAGE) 1000 MG tablet Take 1 tablet (1,000 mg total) by mouth 2 (two) times daily with a meal. 180 tablet 3  . Na Sulfate-K Sulfate-Mg Sulf 17.5-3.13-1.6 GM/180ML SOLN Suprep (no substitutions)-TAKE AS DIRECTED.  Sample of this drug were given to the patient, quantity x 1 kit, Lot Number [id number] , exp.6/20 (Patient not taking: Reported  on 11/26/2017) 354 mL 0  . naproxen sodium (ANAPROX) 220 MG tablet Take 440 mg by mouth 2 (two) times daily with a meal.    . OVER THE COUNTER MEDICATION Take 2 tablets by mouth at bedtime. "RESTLESS LEGS OTC"    . pantoprazole (PROTONIX) 40 MG tablet Take 1 tablet (40 mg total) by mouth daily. 90 tablet 0  . potassium chloride (K-DUR) 10 MEQ tablet Take 1 tablet (10 mEq total) by mouth daily. (Patient not taking: Reported on 01/31/2018) 30 tablet 2  . tamsulosin (FLOMAX) 0.4 MG CAPS capsule Take 1 capsule (0.4 mg total) by mouth daily. 90 capsule 3   No current facility-administered medications on file prior to visit.     Allergies  Allergen Reactions  . Cymbalta [Duloxetine Hcl] Other (See Comments)    Caused anorgasmia    Social History   Socioeconomic History  . Marital status: Significant Other    Spouse name: Not on file  . Number of children: Not on file  . Years of education: Not on file  . Highest education level: Not on file  Occupational History  . Occupation: Furniture conservator/restorer  . Financial resource strain: Not on file  . Food insecurity:    Worry: Not on file    Inability: Not on file  . Transportation needs:    Medical: Not on file    Non-medical: Not on file  Tobacco Use  . Smoking status: Former Smoker    Last attempt to quit: 10/16/1988    Years since quitting: 29.8  . Smokeless tobacco: Never Used  Substance and Sexual Activity  . Alcohol use: No  . Drug use: No  . Sexual activity: Not on file  Lifestyle  . Physical activity:    Days per week: Not on file    Minutes per session: Not on file  . Stress: Not on file  Relationships  . Social connections:    Talks on phone: Not on file    Gets together: Not on file    Attends religious service: Not on file    Active member of club or organization: Not on file    Attends meetings of clubs or organizations: Not on file    Relationship status: Not on file  . Intimate partner violence:     Fear of current or ex partner: Not on file    Emotionally abused: Not on file    Physically abused: Not on file    Forced sexual activity: Not on file  Other Topics Concern  . Not on file  Social History Narrative   Volunteers as Airline pilot    Family History  Problem Relation Age of Onset  . Pancreatic cancer Mother   . CAD Father   . Colon cancer Paternal Grandfather  Past Surgical History:  Procedure Laterality Date  . BACK SURGERY  1993   lumbar  . CERVICAL SPINE SURGERY  2000  . HAND SURGERY      ROS: Review of Systems Negative except as above PHYSICAL EXAM: BP (!) 144/78   Pulse 70   Temp 97.9 F (36.6 C) (Oral)   Resp 16   Wt 232 lb 12.8 oz (105.6 kg)   SpO2 97%   BMI 33.40 kg/m   Wt Readings from Last 3 Encounters:  08/19/18 232 lb 12.8 oz (105.6 kg)  07/08/18 232 lb 6.4 oz (105.4 kg)  06/14/18 233 lb 12.8 oz (106.1 kg)    Physical Exam  General appearance - alert, well appearing, and in no distress Mental status - normal mood, behavior, speech, dress, motor activity, and thought processes Abdomen - soft, nontender, nondistended, no masses or organomegaly  Results for orders placed or performed in visit on 08/19/18  POCT glucose (manual entry)  Result Value Ref Range   POC Glucose 107 (A) 70 - 99 mg/dl    ASSESSMENT AND PLAN: 1. Gastroesophageal reflux disease without esophagitis Continue pantoprazole but I recommend taking it in the mornings about 1 hour before meal.  Add Pepcid to take in the evenings.  Referral to GI for further evaluation. GERD precautions discussed and encouraged.  Advised to eat his last meal at least 3 hours before laying down and to sleep with his head a little elevated.  Advised to avoid foods that are tomato based, spicy foods, fruit juices, caffeine etc. Stop naproxen.  Use Voltaren gel on his back instead - Ambulatory referral to Gastroenterology - famotidine (PEPCID) 20 MG tablet; Take 1 tablet (20 mg total) by  mouth at bedtime.  Dispense: 30 tablet; Refill: 6  2. Esophageal dysphagia - Ambulatory referral to Gastroenterology  3. Other urinary incontinence - Ambulatory referral to Urology  4. Type 2 diabetes mellitus without complication, without long-term current use of insulin ([facility]) This was not addressed today - POCT glucose (manual entry)   Patient was given the opportunity to ask questions.  Patient verbalized understanding of the plan and was able to repeat key elements of the plan.   Orders Placed This Encounter  Procedures  . POCT glucose (manual entry)     Requested Prescriptions    No prescriptions requested or ordered in this encounter    No follow-ups on file.  Karle Plumber, MD, FACP

## 2018-08-19 NOTE — Patient Instructions (Addendum)
Stop Naprosyn/Aleve. Please each her last meal of the day at least 3 hours before you lay down at nights.  Sleep with your head a little elevated.  Try to avoid foods that cause flare of your symptoms.  Take the pantoprazole in the mornings at least an hour before meals. Take the famotidine at bedtime. You have been referred to the gastroenterologist.

## 2018-08-20 MED FILL — glipiZIDE 5 MG TABS: 5 | 30 days supply | Qty: 60 | Fill #3

## 2018-09-02 ENCOUNTER — Telehealth: Payer: Self-pay | Admitting: Internal Medicine

## 2018-09-02 ENCOUNTER — Encounter: Payer: Self-pay | Admitting: Gastroenterology

## 2018-09-02 NOTE — Telephone Encounter (Signed)
Pt called stating he has had diarrhea for 3 days. He has take imodium. Denies blood in stool or  being on ATB's. Yesterday he did not eat so he did not have diarrhea. Advised patient to drink plenty of fluids. Instructed to go to Urgent Care or ED if sx's worsen or fail to improve before appointment on Thursday.  Pt also encourage to take blood sugars despite not eating to ensure blood sugars are not elevated or too low.

## 2018-09-02 NOTE — Telephone Encounter (Signed)
Patient called wanting to speak triad regarding diarrhea he has had for the past 3 days. He says it started Friday morning. Please follow up.

## 2018-09-04 MED FILL — LISINOPRIL 20 MG TAB: 20 | 30 days supply | Qty: 30 | Fill #6

## 2018-09-05 ENCOUNTER — Ambulatory Visit: Payer: Self-pay

## 2018-09-08 ENCOUNTER — Encounter (HOSPITAL_COMMUNITY): Payer: Self-pay

## 2018-09-08 ENCOUNTER — Other Ambulatory Visit: Payer: Self-pay

## 2018-09-08 ENCOUNTER — Ambulatory Visit (HOSPITAL_COMMUNITY)
Admission: EM | Admit: 2018-09-08 | Discharge: 2018-09-08 | Disposition: A | Discharge status: Home / Self Care | Payer: Self-pay | Attending: Family Medicine | Admitting: Family Medicine

## 2018-09-08 ENCOUNTER — Ambulatory Visit (INDEPENDENT_AMBULATORY_CARE_PROVIDER_SITE_OTHER): Payer: Self-pay

## 2018-09-08 DIAGNOSIS — R197 Diarrhea, unspecified: Secondary | ICD-10-CM

## 2018-09-08 DIAGNOSIS — R112 Nausea with vomiting, unspecified: Secondary | ICD-10-CM

## 2018-09-08 DIAGNOSIS — R109 Unspecified abdominal pain: Secondary | ICD-10-CM

## 2018-09-08 LAB — POCT I-STAT, CHEM 8
BUN: 17 mg/dL (ref 6–20)
CALCIUM ION: 1.16 mmol/L (ref 1.15–1.40)
CHLORIDE: 97 mmol/L — AB (ref 98–111)
Creatinine, Ser: 1 mg/dL (ref 0.61–1.24)
Glucose, Bld: 187 mg/dL — ABNORMAL HIGH (ref 70–99)
HEMATOCRIT: 37 % — AB (ref 39.0–52.0)
Hemoglobin: 12.6 g/dL — ABNORMAL LOW (ref 13.0–17.0)
Potassium: 4.2 mmol/L (ref 3.5–5.1)
SODIUM: 134 mmol/L — AB (ref 135–145)
TCO2: 27 mmol/L (ref 22–32)

## 2018-09-08 LAB — POCT URINALYSIS DIP (DEVICE)
BILIRUBIN URINE: NEGATIVE
Glucose, UA: 250 mg/dL — AB
Hgb urine dipstick: NEGATIVE
KETONES UR: NEGATIVE mg/dL
Leukocytes, UA: NEGATIVE
NITRITE: NEGATIVE
PH: 5.5 (ref 5.0–8.0)
PROTEIN: NEGATIVE mg/dL
Specific Gravity, Urine: >=1.03 (ref 1.005–1.030)
Urobilinogen, UA: 0.2 mg/dL (ref 0.0–1.0)

## 2018-09-08 MED ORDER — ONDANSETRON HCL 8 MG PO TABS: 8.0000 mg | ORAL_TABLET | Freq: Three times a day (TID) | ORAL | 0 refills | Status: DC | PRN

## 2018-09-08 NOTE — Discharge Instructions (Addendum)
As we discussed, your symptoms can be caused by a virus.  Sometimes they are caused by a bacterial infection such as food poisoning.  The only way to tell the difference by obtaining a stool sample.  Most infections, bacterial and viral, will clear on their own. The important part of treatment is keeping up with fluids and not becoming dehydrated.  You are slightly dehydrated now. I am giving you Zofran for nausea.  Take this 2 or 3 times a day.  This will keep you from throwing up, and allow you to drink more fluids You may advance to a bland diet as tolerated If you continue to have diarrhea it is safe to take Imodium as indicated on the box (over-the-counter) If you continue to have diarrhea then a stool sample for bacterial testing would be helpful

## 2018-09-08 NOTE — ED Triage Notes (Signed)
Pt  Diarrhea and vomiting  X 2 weeks or more. Pt states she's not eating much.

## 2018-09-08 NOTE — ED Provider Notes (Signed)
[facility]    CSN: [id number] Arrival date & time: 09/08/18  1338     History   Chief Complaint Chief Complaint  Patient presents with  . Diarrhea    HPI Jeremy [name] is a 57 y.o. male.   HPI  Patient is here for nausea vomiting and diarrhea.  Is been going on for almost 2 weeks.  He started off with diarrhea.  Multiple watery stools a day.  This seems like it was getting better after about 5 days.  He had a couple days where he only had minimal loose bowels.  Then he started vomiting.  Now he vomits frequently throughout the day.  When he vomits he also has diarrhea.  He thinks he is keeping enough water down not to be dehydrated.  Every time he tries to eat something he ends up with loose bowels.  He has had some cramping.  He has upper abdominal pain consistent with ulcers and GERD.  This is treated by his PCP.  He has not been able to take his GERD medicines for the last week.  No fever or chills.  He is fatigued.  He has a headache.  He does not feel dizzy when he stands up quickly. Other than his GERD he does not have any known gastrointestinal disorders.  No diverticulosis, no abdominal surgeries, no gallbladder disease. He is a well-controlled diabetic who is compliant with medication. He is a well-controlled hypertensive who is compliant with medication. No change in medicines or new medicines recently. No recent travel No ingestion of food he thought was suspicious No recent antibiotics No one else in his family is sick  Past Medical History:  Diagnosis Date  . Diabetes mellitus without complication ([place])   . GERD (gastroesophageal reflux disease)   . Hyperlipidemia   . Hypertension   . Rash of entire body 03/2016  . Sleep apnea    NO CPAP per pt    Patient Active Problem List   Diagnosis Date Noted  . Esophageal dysphagia 08/19/2018  . Benign prostatic hyperplasia with post-void dribbling 01/31/2018  . Absence of bladder continence 01/31/2018  .  Obesity (BMI 30-39.9) 11/05/2017  . GERD (gastroesophageal reflux disease) 06/03/2017  . Chronic pain of both knees 06/03/2017  . Chronic lower back pain 06/03/2017  . Type 2 diabetes mellitus without complication, without long-term current use of insulin ([place]) 04/12/2016  . Essential hypertension 04/12/2016    Past Surgical History:  Procedure Laterality Date  . BACK SURGERY  1993   lumbar  . CERVICAL SPINE SURGERY  2000  . HAND SURGERY         Home Medications    Prior to Admission medications   Medication Sig Start Date End Date Taking? Authorizing Provider  acetaminophen (TYLENOL) 500 MG tablet Take 500 mg by mouth 2 (two) times daily.    [provider]  atorvastatin (LIPITOR) 40 MG tablet Take 1 tablet (40 mg total) by mouth daily. 01/31/18   [name], MD  Blood Glucose Monitoring Suppl (TRUE METRIX METER) w/Device KIT 1 each by Does not apply route 4 (four) times daily -  before meals and at bedtime. 04/12/16   Elsie Stain, MD  diphenhydrAMINE (BENADRYL) 25 MG tablet Take 25 mg by mouth every 6 (six) hours as needed for itching or allergies. Reported on 04/12/2016    [provider]  famotidine (PEPCID) 20 MG tablet Take 1 tablet (20 mg total) by mouth at bedtime. 08/19/18  [name], MD  glipiZIDE (GLUCOTROL) 5 MG tablet Take 1 tablet (5 mg total) by mouth 2 (two) times daily before a meal. 01/31/18   [name], MD  glucose blood (TRUE METRIX BLOOD GLUCOSE TEST) test strip Use as instructed 04/12/16   Elsie Stain, MD  lisinopril (PRINIVIL,ZESTRIL) 20 MG tablet Take 1 tablet (20 mg total) by mouth daily. Stop Lisinopril/HCTZ 01/31/18   [name], MD  metFORMIN (GLUCOPHAGE) 1000 MG tablet Take 1 tablet (1,000 mg total) by mouth 2 (two) times daily with a meal. 01/02/18   [name], MD  ondansetron (ZOFRAN) 8 MG tablet Take 1 tablet (8 mg total) by mouth every 8 (eight) hours as needed for nausea or vomiting.  09/08/18   Raylene Everts, MD  OVER THE COUNTER MEDICATION Take 2 tablets by mouth at bedtime. "RESTLESS LEGS OTC"    [provider]  pantoprazole (PROTONIX) 40 MG tablet Take 1 tablet (40 mg total) by mouth daily. 07/08/18   Scot Jun, FNP  tamsulosin (FLOMAX) 0.4 MG CAPS capsule Take 1 capsule (0.4 mg total) by mouth daily. 07/08/18   Scot Jun, FNP    Family History Family History  Problem Relation Age of Onset  . Pancreatic cancer Mother   . CAD Father   . Colon cancer Paternal Grandfather     Social History Social History   Tobacco Use  . Smoking status: Former Smoker    Last attempt to quit: 10/16/1988    Years since quitting: 29.9  . Smokeless tobacco: Never Used  Substance Use Topics  . Alcohol use: No  . Drug use: No     Allergies   Cymbalta [duloxetine hcl]   Review of Systems Review of Systems  Constitutional: Positive for fatigue. Negative for chills and fever.  HENT: Negative for ear pain and sore throat.   Eyes: Negative for pain and visual disturbance.  Respiratory: Negative for cough and shortness of breath.   Cardiovascular: Negative for chest pain and palpitations.  Gastrointestinal: Positive for abdominal pain, diarrhea, nausea and vomiting.  Genitourinary: Negative for dysuria and hematuria.  Musculoskeletal: Negative for arthralgias and back pain.  Skin: Negative for color change and rash.  Neurological: Negative for seizures and syncope.  Psychiatric/Behavioral: Negative for sleep disturbance. The patient is not nervous/anxious.   All other systems reviewed and are negative.    Physical Exam Triage Vital Signs ED Triage Vitals  Enc Vitals Group     BP 09/08/18 1515 (!) 152/65     Pulse Rate 09/08/18 1515 66     Resp 09/08/18 1515 18     Temp 09/08/18 1515 98 F (36.7 C)     Temp Source 09/08/18 1515 Oral     SpO2 09/08/18 1515 98 %     Weight 09/08/18 1514 232 lb (105.2 kg)     Height --      Head  Circumference --      Peak Flow --      Pain Score 09/08/18 1514 6     Pain Loc --      Pain Edu? --      Excl. in [place]? --    No data found.  Updated Vital Signs BP (!) 152/65 (BP Location: Right Arm)   Pulse 66   Temp 98 F (36.7 C) (Oral)   Resp 18   Wt 105.2 kg   SpO2 98%   BMI 33.29 kg/m  Physical Exam  Constitutional: He appears well-developed and well-nourished. No distress.  HENT:  Head: Normocephalic and atraumatic.  Right Ear: External ear normal.  Left Ear: External ear normal.  Mouth/Throat: Oropharynx is clear and moist.  Mucous membranes moist  Eyes: Pupils are equal, round, and reactive to light. Conjunctivae are normal.  Neck: Normal range of motion. Neck supple.  Cardiovascular: Normal rate, regular rhythm and normal heart sounds.  Pulmonary/Chest: Effort normal and breath sounds normal. No respiratory distress.  Abdominal: Soft. Bowel sounds are normal. He exhibits no distension. There is tenderness.  Tenderness in the midepigastrium.  Active bowel sounds.  Soft.  No guarding or tenderness.  No rebound.  No organomegaly  Musculoskeletal: Normal range of motion. He exhibits no edema.  Neurological: He is alert.  Skin: Skin is warm and dry.  Psychiatric: He has a normal mood and affect. His behavior is normal.     UC Treatments / Results  Labs (all labs ordered are listed, but only abnormal results are displayed) Labs Reviewed  POCT URINALYSIS DIP (DEVICE) - Abnormal; Notable for the following components:      Result Value   Glucose, UA 250 (*)    All other components within normal limits  POCT I-STAT, CHEM 8 - Abnormal; Notable for the following components:   Sodium 134 (*)    Chloride 97 (*)    Glucose, Bld 187 (*)    Hemoglobin 12.6 (*)    HCT 37.0 (*)    All other components within normal limits  GASTROINTESTINAL PANEL BY PCR, STOOL (REPLACES STOOL CULTURE)    EKG None  Radiology Dg Abdomen Acute W/chest  Result Date:  09/08/2018 CLINICAL DATA:  Diarrhea and vomiting for 2 weeks.  Pain. EXAM: DG ABDOMEN ACUTE W/ 1V CHEST COMPARISON:  Chest x-ray November 27, 2017 FINDINGS: There is no evidence of dilated bowel loops or free intraperitoneal air. No radiopaque calculi or other significant radiographic abnormality is seen. Heart size and mediastinal contours are within normal limits. Both lungs are clear. IMPRESSION: Negative abdominal radiographs.  No acute cardiopulmonary disease. Electronically Signed   By: Dorise Bullion III M.D   On: 09/08/2018 16:36    Procedures Procedures (including critical care time)  Medications Ordered in UC Medications - No data to display  Initial Impression / Assessment and Plan / UC Course  I have reviewed the triage vital signs and the nursing notes.  Pertinent labs & imaging results that were available during my care of the patient were reviewed by me and considered in my medical decision making (see chart for details).     I reviewed the lab test and x-ray test with the patient and his wife.  We discussed that this is a gastroenteritis type of infection.  It can be bacterial or viral.  See the discharge instructions.  I discussed the proper method and timing of bringing back a stool sample.  Discussed symptomatic care.  Discussed prevention of dehydration.  Careful checking of sugar.  Last hemoglobin A1c was 6.4.  Final Clinical Impressions(s) / UC Diagnoses   Final diagnoses:  Nausea vomiting and diarrhea     Discharge Instructions     As we discussed, your symptoms can be caused by a virus.  Sometimes they are caused by a bacterial infection such as food poisoning.  The only way to tell the difference by obtaining a stool sample.  Most infections, bacterial and viral, will clear on their own. The important part of treatment is keeping  up with fluids and not becoming dehydrated.  You are slightly dehydrated now. I am giving you Zofran for nausea.  Take this 2 or 3  times a day.  This will keep you from throwing up, and allow you to drink more fluids You may advance to a bland diet as tolerated If you continue to have diarrhea it is safe to take Imodium as indicated on the box (over-the-counter) If you continue to have diarrhea then a stool sample for bacterial testing would be helpful    ED Prescriptions    Medication Sig Dispense Auth. Provider   ondansetron (ZOFRAN) 8 MG tablet Take 1 tablet (8 mg total) by mouth every 8 (eight) hours as needed for nausea or vomiting. 20 tablet Raylene Everts, MD     Controlled Substance Prescriptions [place] Controlled Substance Registry consulted? Not Applicable   Raylene Everts, MD 09/08/18 1710

## 2018-09-09 MED FILL — PANTOPRAZOLE SOD DR 40 MG T: 40 | 30 days supply | Qty: 30 | Fill #2

## 2018-09-09 MED FILL — ?ATORVASTATIN 40MG TABLET: 40 | 30 days supply | Qty: 30 | Fill #6

## 2018-09-09 MED FILL — ?METFORMIN HCL 1000MG TABS: 1000 | 30 days supply | Qty: 60 | Fill #7

## 2018-09-09 MED FILL — TAMSULOSIN HCL 0.4 MG CAP: 0.4 | 30 days supply | Qty: 30 | Fill #6

## 2018-09-10 MED FILL — ?ONDANSETON HCL 8MG TAB: 8 | 6 days supply | Qty: 20 | Fill #0

## 2018-09-27 MED FILL — glipiZIDE 5 MG TABS: 5 | 30 days supply | Qty: 60 | Fill #4

## 2018-10-11 MED FILL — LISINOPRIL 20 MG TAB: 20 | 30 days supply | Qty: 30 | Fill #7

## 2018-10-11 MED FILL — ?METFORMIN HCL 1,000 MG TAB: 1000 | 30 days supply | Qty: 60 | Fill #8

## 2018-10-11 MED FILL — ?ATORVASTATIN 40MG TABLET: 40 | 30 days supply | Qty: 30 | Fill #7

## 2018-10-18 MED FILL — TAMSULOSIN HCL 0.4 MG CAP: 0.4 | 30 days supply | Qty: 30 | Fill #7

## 2018-10-30 ENCOUNTER — Encounter: Payer: Self-pay | Admitting: Internal Medicine

## 2018-10-30 NOTE — Progress Notes (Signed)
See by [facility] 10/24/2018.  Dx with phimosis, ED and post-void dribbling.  Offered circumcision vs trail of topical steroid cream (Betamethasone).  Pt opted for the latter.

## 2018-11-04 MED FILL — ?GLIPIZIDE 5MG TABLET: 5 | 30 days supply | Qty: 60 | Fill #5

## 2018-11-05 MED FILL — BETAMETHASONE DP 0.05% CRM: 0.05 | 28 days supply | Qty: 45 | Fill #0

## 2018-11-11 ENCOUNTER — Other Ambulatory Visit: Payer: Self-pay | Admitting: Family Medicine

## 2018-11-11 MED FILL — ?ATORVASTATIN 40MG TABLET: 40 | 30 days supply | Qty: 30 | Fill #8

## 2018-11-12 MED FILL — ?PANTOPRAZOLE SO DR 40MG TA: 40 | 30 days supply | Qty: 30 | Fill #0

## 2018-11-18 MED FILL — TAMSULOSIN HCL 0.4 MG CAP: 0.4 | 30 days supply | Qty: 30 | Fill #8

## 2018-11-18 MED FILL — LISINOPRIL 20 MG TAB: 20 | 30 days supply | Qty: 30 | Fill #8

## 2018-11-18 MED FILL — ?METFORMIN HCL 1000MG TABS: 1000 | 30 days supply | Qty: 60 | Fill #9

## 2018-11-19 ENCOUNTER — Encounter: Payer: Self-pay | Admitting: Internal Medicine

## 2018-11-19 ENCOUNTER — Ambulatory Visit: Payer: Self-pay | Attending: Internal Medicine | Admitting: Internal Medicine

## 2018-11-19 VITALS — BP 124/60 | HR 67 | Temp 97.6°F | Resp 16 | Ht 70.0 in | Wt 234.6 lb

## 2018-11-19 DIAGNOSIS — D649 Anemia, unspecified: Secondary | ICD-10-CM

## 2018-11-19 DIAGNOSIS — R200 Anesthesia of skin: Secondary | ICD-10-CM

## 2018-11-19 DIAGNOSIS — E669 Obesity, unspecified: Secondary | ICD-10-CM

## 2018-11-19 DIAGNOSIS — I10 Essential (primary) hypertension: Secondary | ICD-10-CM

## 2018-11-19 DIAGNOSIS — E119 Type 2 diabetes mellitus without complications: Secondary | ICD-10-CM

## 2018-11-19 DIAGNOSIS — N471 Phimosis: Secondary | ICD-10-CM

## 2018-11-19 LAB — POCT URINALYSIS DIP (CLINITEK)
Bilirubin, UA: NEGATIVE
Glucose, UA: 500 mg/dL — AB
Ketones, POC UA: NEGATIVE mg/dL
LEUKOCYTES UA: NEGATIVE
NITRITE UA: NEGATIVE
POC PROTEIN,UA: NEGATIVE
RBC UA: NEGATIVE
Spec Grav, UA: 1.025 (ref 1.010–1.025)
UROBILINOGEN UA: 0.2 U/dL
pH, UA: 5.5 (ref 5.0–8.0)

## 2018-11-19 LAB — POCT GLYCOSYLATED HEMOGLOBIN (HGB A1C): HbA1c, POC (controlled diabetic range): 6.9 % (ref 0.0–7.0)

## 2018-11-19 LAB — GLUCOSE, POCT (MANUAL RESULT ENTRY): POC GLUCOSE: 116 mg/dL — AB (ref 70–99)

## 2018-11-19 MED ORDER — METFORMIN HCL 1000 MG PO TABS: 500.0000 mg | ORAL_TABLET | Freq: Two times a day (BID) | ORAL | 3 refills | Status: DC

## 2018-11-19 MED ORDER — GLIPIZIDE 5 MG PO TABS: ORAL_TABLET | ORAL | 6 refills | Status: DC

## 2018-11-19 MED FILL — metFORMIN HCL 1000 MG TABS: 1000 | 30 days supply | Qty: 30 | Fill #0

## 2018-11-19 MED FILL — ?GLIPIZIDE 5MG TABLET: 5 | 30 days supply | Qty: 90 | Fill #0

## 2018-11-19 NOTE — Patient Instructions (Signed)
Decrease metformin to 500 mg twice a day. Increase glipizide to 10 mg in the morning and 5 mg in the evenings. Try to get your eye exam done as soon as possible.  Follow a Healthy Eating Plan - You can do it! Limit sugary drinks.  Avoid sodas, sweet tea, sport or energy drinks, or fruit drinks.  Drink water, lo-fat milk, or diet drinks. Limit snack foods.   Cut back on candy, cake, cookies, chips, ice cream.  These are a special treat, only in small amounts. Eat plenty of vegetables.  Especially dark green, red, and orange vegetables. Aim for at least 3 servings a day. More is better! Include fruit in your daily diet.  Whole fruit is much healthier than fruit juice! Limit "white" bread, "white" pasta, "white" rice.   Choose "100% whole grain" products, brown or wild rice. Avoid fatty meats. Try "Meatless Monday" and choose eggs or beans one day a week.  When eating meat, choose lean meats like chicken, [place], and fish.  Grill, broil, or bake meats instead of frying, and eat poultry without the skin. Eat less salt.  Avoid frozen pizzas, frozen dinners and salty foods.  Use seasonings other than salt in cooking.  This can help blood pressure and keep you from swelling Beer, wine and liquor have calories.  If you can safely drink alcohol, limit to 1 drink per day for women, 2 drinks for men

## 2018-11-19 NOTE — Progress Notes (Signed)
Patient ID: Jeremy [name], male    DOB: 1961-07-18  MRN: [id number]  CC: Hypertension and Diabetes   Subjective: Jeremy [name] is a 58 y.o. male who presents for chronic ds management. His concerns today include:  Pt with hx of DM, HL, HTN,GERD, chronic LBP, ED.  On last visit, patient was referred to gastroenterology for evaluation of dysphasia and reflux.  He has an appointment with GI later this month.  He has seen the urologist. Dx with phimosis, ED and post-void dribbling.  Offered circumcision vs trail of topical steroid cream (Betamethasone).  Pt opted for the latter. He got the cream but he forgets to use it.  "My kidneys have been feeling heavy. Not really pain but more of a discomfort."  He notices this discomfort first thing in the mornings and when he has to urinate.  He has not felt it in the past few days however.  No fever. No dysuria or hematuria  DM: checks BS 2-3 x a mths Compliant with meds.  Gets bothersome diarrhea about once a week where he has to cancel any appointments that he may have that day. "I'm trying to cut back on some of the starches." Never had eye exam. Has problems seeing things close up.   Intermittent numbness in the forearms and hands that occur about once a day and last for several seconds.  He has to shake the hands " to get the circulation going again."  HTN: Reports compliance with lisinopril.  He limits salt in the foods.  He has not taken lisinopril as yet for the day.  No chest pains or shortness of breath. Reports some dizziness if he is on bended knees and then stands up too quickly. Patient Active Problem List   Diagnosis Date Noted  . Esophageal dysphagia 08/19/2018  . Benign prostatic hyperplasia with post-void dribbling 01/31/2018  . Absence of bladder continence 01/31/2018  . Obesity (BMI 30-39.9) 11/05/2017  . GERD (gastroesophageal reflux disease) 06/03/2017  . Chronic pain of both knees 06/03/2017  . Chronic lower back pain  06/03/2017  . Type 2 diabetes mellitus without complication, without long-term current use of insulin ([place]) 04/12/2016  . Essential hypertension 04/12/2016     Current Outpatient Medications on File Prior to Visit  Medication Sig Dispense Refill  . acetaminophen (TYLENOL) 500 MG tablet Take 500 mg by mouth 2 (two) times daily.    Marland Kitchen atorvastatin (LIPITOR) 40 MG tablet Take 1 tablet (40 mg total) by mouth daily. 90 tablet 3  . Blood Glucose Monitoring Suppl (TRUE METRIX METER) w/Device KIT 1 each by Does not apply route 4 (four) times daily -  before meals and at bedtime. 1 kit 0  . diphenhydrAMINE (BENADRYL) 25 MG tablet Take 25 mg by mouth every 6 (six) hours as needed for itching or allergies. Reported on 04/12/2016    . famotidine (PEPCID) 20 MG tablet Take 1 tablet (20 mg total) by mouth at bedtime. 30 tablet 6  . glucose blood (TRUE METRIX BLOOD GLUCOSE TEST) test strip Use as instructed 100 each 12  . lisinopril (PRINIVIL,ZESTRIL) 20 MG tablet Take 1 tablet (20 mg total) by mouth daily. Stop Lisinopril/HCTZ 90 tablet 3  . ondansetron (ZOFRAN) 8 MG tablet Take 1 tablet (8 mg total) by mouth every 8 (eight) hours as needed for nausea or vomiting. 20 tablet 0  . OVER THE COUNTER MEDICATION Take 2 tablets by mouth at bedtime. "RESTLESS LEGS OTC"    . pantoprazole (PROTONIX)  40 MG tablet TAKE 1 TABLET (40 MG TOTAL) BY MOUTH DAILY. 90 tablet 0  . tamsulosin (FLOMAX) 0.4 MG CAPS capsule Take 1 capsule (0.4 mg total) by mouth daily. 90 capsule 3   No current facility-administered medications on file prior to visit.     Allergies  Allergen Reactions  . Cymbalta [Duloxetine Hcl] Other (See Comments)    Caused anorgasmia    Social History   Socioeconomic History  . Marital status: Significant Other    Spouse name: Not on file  . Number of children: Not on file  . Years of education: Not on file  . Highest education level: Not on file  Occupational History  . Occupation: Statistician  . Financial resource strain: Not on file  . Food insecurity:    Worry: Not on file    Inability: Not on file  . Transportation needs:    Medical: Not on file    Non-medical: Not on file  Tobacco Use  . Smoking status: Former Smoker    Last attempt to quit: 10/16/1988    Years since quitting: 30.1  . Smokeless tobacco: Never Used  Substance and Sexual Activity  . Alcohol use: No  . Drug use: No  . Sexual activity: Not on file  Lifestyle  . Physical activity:    Days per week: Not on file    Minutes per session: Not on file  . Stress: Not on file  Relationships  . Social connections:    Talks on phone: Not on file    Gets together: Not on file    Attends religious service: Not on file    Active member of club or organization: Not on file    Attends meetings of clubs or organizations: Not on file    Relationship status: Not on file  . Intimate partner violence:    Fear of current or ex partner: Not on file    Emotionally abused: Not on file    Physically abused: Not on file    Forced sexual activity: Not on file  Other Topics Concern  . Not on file  Social History Narrative   Volunteers as Airline pilot    Family History  Problem Relation Age of Onset  . Pancreatic cancer Mother   . CAD Father   . Colon cancer Paternal Grandfather     Past Surgical History:  Procedure Laterality Date  . BACK SURGERY  1993   lumbar  . CERVICAL SPINE SURGERY  2000  . HAND SURGERY      ROS: Review of Systems Negative except as above PHYSICAL EXAM: BP 124/60   Pulse 67   Temp 97.6 F (36.4 C) (Oral)   Resp 16   Ht _0  (1.778 m)   Wt 234 lb 9.6 oz (106.4 kg)   SpO2 97%   BMI 33.66 kg/m   Wt Readings from Last 3 Encounters:  11/19/18 234 lb 9.6 oz (106.4 kg)  09/08/18 232 lb (105.2 kg)  08/19/18 232 lb 12.8 oz (105.6 kg)   BP 124/60 Physical Exam General appearance - alert, well appearing, and in no distress Mental status - normal mood,  behavior, speech, dress, motor activity, and thought processes Mouth - mucous membranes moist, pharynx normal without lesions Neck - supple, no significant adenopathy Chest - clear to auscultation, no wheezes, rales or rhonchi, symmetric air entry Heart - normal rate, regular rhythm, normal S1, S2, no murmurs, rubs, clicks or gallops Abdomen -normal bowel sounds,  soft and nontender.  No guarding or rebound.  No flank tenderness Extremities - peripheral pulses normal including radials and brachial, no pedal edema, no clubbing or cyanosis Neurologic: Grip 5/5 bilaterally.  No wasting of intrinsic muscles of the hands.  Gross sensation intact in both hands and arms. Results for orders placed or performed in visit on 11/19/18  POCT glucose (manual entry)  Result Value Ref Range   POC Glucose 116 (A) 70 - 99 mg/dl  POCT glycosylated hemoglobin (Hb A1C)  Result Value Ref Range   Hemoglobin A1C     HbA1c POC (<> result, manual entry)     HbA1c, POC (prediabetic range)     HbA1c, POC (controlled diabetic range) 6.9 0.0 - 7.0 %  POCT URINALYSIS DIP (CLINITEK)  Result Value Ref Range   Color, UA yellow yellow   Clarity, UA clear clear   Glucose, UA =500 (A) negative mg/dL   Bilirubin, UA negative negative   Ketones, POC UA negative negative mg/dL   Spec Grav, UA 1.025 1.010 - 1.025   Blood, UA negative negative   pH, UA 5.5 5.0 - 8.0   POC PROTEIN,UA negative negative, trace   Urobilinogen, UA 0.2 0.2 or 1.0 E.U./dL   Nitrite, UA Negative Negative   Leukocytes, UA Negative Negative    ASSESSMENT AND PLAN: 1. Type 2 diabetes mellitus without complication, without long-term current use of insulin (HCC) Intermittent numbness in hands and arms may be due to diabetes or B12 deficiency given that he has been on metformin for a while.  We will check B12 level. -Patient reports bothersome diarrhea once a week on metformin.  I recommend that we cut the dose in half to 500 mg twice a day to see if  this decreases the diarrhea episodes.  Increase Glucotrol to 10 mg in the morning and continue 5 mg in the evenings - POCT glucose (manual entry) - POCT glycosylated hemoglobin (Hb A1C) - POCT URINALYSIS DIP (CLINITEK) - CBC - Comprehensive metabolic panel - Lipid panel - metFORMIN (GLUCOPHAGE) 1000 MG tablet; Take 0.5 tablets (500 mg total) by mouth 2 (two) times daily with a meal.  Dispense: 90 tablet; Refill: 3 - glipiZIDE (GLUCOTROL) 5 MG tablet; Take 2 tablets PO in the mornings and 1 tablet in the evenings.  Dispense: 90 tablet; Refill: 6  2. Essential hypertension At goal.  Continue lisinopril  3. Obesity (BMI 30.0-34.9) Dietary counseling given.  Encouraged him to increase his physical activity with goal of trying to get in at least 30 minutes 3 to 4 days a week  4. Arm numbness See #1 above - Vitamin B12  5. Phimosis of penis Encourage him to use the cream that was prescribed by urology.  He has a follow-up appointment with them in several weeks to report how he is doing     Patient was given the opportunity to ask questions.  Patient verbalized understanding of the plan and was able to repeat key elements of the plan.   Orders Placed This Encounter  Procedures  . CBC  . Comprehensive metabolic panel  . Lipid panel  . Vitamin B12  . POCT glucose (manual entry)  . POCT glycosylated hemoglobin (Hb A1C)  . POCT URINALYSIS DIP (CLINITEK)     Requested Prescriptions   Signed Prescriptions Disp Refills  . metFORMIN (GLUCOPHAGE) 1000 MG tablet 90 tablet 3    Sig: Take 0.5 tablets (500 mg total) by mouth 2 (two) times daily with a meal.  . glipiZIDE (  GLUCOTROL) 5 MG tablet 90 tablet 6    Sig: Take 2 tablets PO in the mornings and 1 tablet in the evenings.    Return in about 3 months (around 02/17/2019).  Karle Plumber, MD, FACP

## 2018-11-20 ENCOUNTER — Other Ambulatory Visit: Payer: Self-pay | Admitting: Internal Medicine

## 2018-11-20 DIAGNOSIS — D649 Anemia, unspecified: Secondary | ICD-10-CM

## 2018-11-20 LAB — CBC
HEMATOCRIT: 36.2 % — AB (ref 37.5–51.0)
Hemoglobin: 12.1 g/dL — ABNORMAL LOW (ref 13.0–17.7)
MCH: 29.2 pg (ref 26.6–33.0)
MCHC: 33.4 g/dL (ref 31.5–35.7)
MCV: 87 fL (ref 79–97)
Platelets: 206 10*3/uL (ref 150–450)
RBC: 4.15 x10E6/uL (ref 4.14–5.80)
RDW: 13.5 % (ref 11.6–15.4)
WBC: 5.8 10*3/uL (ref 3.4–10.8)

## 2018-11-20 LAB — COMPREHENSIVE METABOLIC PANEL
A/G RATIO: 1.8 (ref 1.2–2.2)
ALBUMIN: 4.3 g/dL (ref 3.8–4.9)
ALK PHOS: 55 IU/L (ref 39–117)
ALT: 37 IU/L (ref 0–44)
AST: 29 IU/L (ref 0–40)
BUN / CREAT RATIO: 20 (ref 9–20)
BUN: 15 mg/dL (ref 6–24)
CHLORIDE: 101 mmol/L (ref 96–106)
CO2: 21 mmol/L (ref 20–29)
CREATININE: 0.74 mg/dL — AB (ref 0.76–1.27)
Calcium: 9.7 mg/dL (ref 8.7–10.2)
GFR calc Af Amer: 120 mL/min/{1.73_m2} (ref >59)
GFR calc non Af Amer: 104 mL/min/{1.73_m2} (ref >59)
GLOBULIN, TOTAL: 2.4 g/dL (ref 1.5–4.5)
Glucose: 102 mg/dL — ABNORMAL HIGH (ref 65–99)
POTASSIUM: 4.1 mmol/L (ref 3.5–5.2)
SODIUM: 137 mmol/L (ref 134–144)
Total Protein: 6.7 g/dL (ref 6.0–8.5)

## 2018-11-20 LAB — LIPID PANEL
CHOLESTEROL TOTAL: 95 mg/dL — AB (ref 100–199)
Chol/HDL Ratio: 2.2 ratio (ref 0.0–5.0)
HDL: 43 mg/dL (ref >39)
LDL CALC: 33 mg/dL (ref 0–99)
Triglycerides: 97 mg/dL (ref 0–149)
VLDL Cholesterol Cal: 19 mg/dL (ref 5–40)

## 2018-11-20 LAB — VITAMIN B12: Vitamin B-12: 469 pg/mL (ref 232–1245)

## 2018-11-20 NOTE — Addendum Note (Signed)
Addended by: Jonah Blue B on: 11/20/2018 09:00 AM   Modules accepted: Orders

## 2018-11-22 ENCOUNTER — Telehealth: Payer: Self-pay

## 2018-11-22 ENCOUNTER — Telehealth: Payer: Self-pay | Admitting: Internal Medicine

## 2018-11-22 ENCOUNTER — Other Ambulatory Visit: Payer: Self-pay | Admitting: Internal Medicine

## 2018-11-22 LAB — IRON,TIBC AND FERRITIN PANEL
FERRITIN: 16 ng/mL — AB (ref 30–400)
IRON SATURATION: 12 % — AB (ref 15–55)
IRON: 42 ug/dL (ref 38–169)
Total Iron Binding Capacity: 339 ug/dL (ref 250–450)
UIBC: 297 ug/dL (ref 111–343)

## 2018-11-22 LAB — SPECIMEN STATUS REPORT

## 2018-11-22 MED ORDER — FERROUS SULFATE 325 (65 FE) MG PO TABS: 325.0000 mg | ORAL_TABLET | Freq: Every day | ORAL | 0 refills | Status: DC

## 2018-11-22 MED FILL — FERROUS SULFATE 325 MG TAB: 325 (65 FE) | 30 days supply | Qty: 30 | Fill #0

## 2018-11-22 NOTE — Telephone Encounter (Signed)
Contacted pt to go over lab results pt didn't answer lvm asking pt to give me a call at his earliest convenience  

## 2018-11-22 NOTE — Telephone Encounter (Signed)
Pt called in wants to be called back by nurse pt did not disclose reason for call

## 2018-11-24 ENCOUNTER — Encounter: Payer: Self-pay | Admitting: Internal Medicine

## 2018-11-25 ENCOUNTER — Encounter: Payer: Self-pay | Admitting: Gastroenterology

## 2018-11-25 ENCOUNTER — Ambulatory Visit (INDEPENDENT_AMBULATORY_CARE_PROVIDER_SITE_OTHER): Payer: Self-pay | Admitting: Gastroenterology

## 2018-11-25 ENCOUNTER — Encounter: Payer: Self-pay | Admitting: *Deleted

## 2018-11-25 ENCOUNTER — Other Ambulatory Visit: Payer: Self-pay | Admitting: *Deleted

## 2018-11-25 VITALS — BP 149/68 | HR 69 | Temp 97.2°F | Ht 70.0 in | Wt 241.2 lb

## 2018-11-25 DIAGNOSIS — K219 Gastro-esophageal reflux disease without esophagitis: Secondary | ICD-10-CM

## 2018-11-25 DIAGNOSIS — D509 Iron deficiency anemia, unspecified: Secondary | ICD-10-CM

## 2018-11-25 MED ORDER — PANTOPRAZOLE SODIUM 40 MG PO TBEC: 40.0000 mg | DELAYED_RELEASE_TABLET | Freq: Two times a day (BID) | ORAL | 1 refills | Status: DC

## 2018-11-25 MED FILL — PANTOPRAZOLE SOD DR 40 MG T: 40 | 30 days supply | Qty: 60 | Fill #0

## 2018-11-25 NOTE — Assessment & Plan Note (Signed)
New onset iron deficiency anemia in the setting of refractory GERD, chronic NSAID use, no prior colonoscopy.  Need to evaluate for occult GI bleeding.  Need for colonoscopy with upper endoscopy in the near future.  I have discussed the risks, alternatives, benefits with regards to but not limited to the risk of reaction to medication, bleeding, infection, perforation and the patient is agreeable to proceed. Written consent to be obtained.

## 2018-11-25 NOTE — Progress Notes (Addendum)
Primary Care Physician:  [name], MD  Primary Gastroenterologist:  Barney Drain, MD REVIEWED. TCS/POSSIBLE EGD DUE TO OBSCURE GI BLEED/NEW ONSET IRON DEFICIENCY ANEMIA. CANCER.  Chief Complaint  Patient presents with  . Gastroesophageal Reflux    HPI:  Jeremy [name] is a 58 y.o. male here at the request of Dr. Wynetta Emery for further evaluation of GERD, dysphagia, iron deficiency anemia.  Patient tells me he was on Zantac for years and eventually it stopped working.  He was then put on a PPI, he cannot remember the name but according to his PCP records he was on omeprazole 20 mg daily for couple of years.  Couple months ago started having refractory reflux particularly at nighttime.  He was switched to pantoprazole in the morning, Pepcid at nighttime.  Really does not feel like Pepcid was helping therefore he stopped it.  He complains of waking up in the middle the night with burning, burping up undigested food.  Last night he burped up onions.  He has had diabetes for more than 10 years.  Really denies any nausea or vomiting.  He notes that he is lactose intolerant.  If he misses a PPI more than 2 days he develops epigastric pain.  He denies any problems swallowing whatsoever.  He was asked multiple times if he felt like food was sticking in the chest and he denies.  He denies any constipation or diarrhea.  No melena or rectal bleeding.  He states he has a history of ulcer diagnosed based on symptoms back in the 90s, after taking ibuprofen thousand milligrams every 4 hours after back surgery.  Currently is taking Aleve 2 in the morning and 2 at bedtime.  No prior colonoscopy.  Paternal grand father had colon cancer.  Labs 1 year ago indicated normal hemoglobin of 13.2.  TWO months ago hemoglobin was 12.6. Labs LAST WEEK 12.1.  MCV normal at 87.  Iron saturations 12% low, TIBC 339, ferritin 16 low, vitamin B12 469.  Current Outpatient Medications  Medication Sig Dispense Refill  .  acetaminophen (TYLENOL) 500 MG tablet Take 500 mg by mouth 2 (two) times daily.    Marland Kitchen atorvastatin (LIPITOR) 40 MG tablet Take 1 tablet (40 mg total) by mouth daily. 90 tablet 3  . Blood Glucose Monitoring Suppl (TRUE METRIX METER) w/Device KIT 1 each by Does not apply route 4 (four) times daily -  before meals and at bedtime. 1 kit 0  . diphenhydrAMINE (BENADRYL) 25 MG tablet Take 25 mg by mouth every 6 (six) hours as needed for itching or allergies. Reported on 04/12/2016    . ferrous sulfate 325 (65 FE) MG tablet Take 1 tablet (325 mg total) by mouth daily with breakfast. 90 tablet 0  . glipiZIDE (GLUCOTROL) 5 MG tablet Take 2 tablets PO in the mornings and 1 tablet in the evenings. 90 tablet 6  . glucose blood (TRUE METRIX BLOOD GLUCOSE TEST) test strip Use as instructed 100 each 12  . lisinopril (PRINIVIL,ZESTRIL) 20 MG tablet Take 1 tablet (20 mg total) by mouth daily. Stop Lisinopril/HCTZ 90 tablet 3  . metFORMIN (GLUCOPHAGE) 1000 MG tablet Take 0.5 tablets (500 mg total) by mouth 2 (two) times daily with a meal. 90 tablet 3  . Naproxen Sodium (ALEVE) 220 MG CAPS Take 2 capsules by mouth 2 (two) times daily.    . ondansetron (ZOFRAN) 8 MG tablet Take 1 tablet (8 mg total) by mouth every 8 (eight) hours as needed for nausea or  vomiting. 20 tablet 0  . OVER THE COUNTER MEDICATION Take 2 tablets by mouth at bedtime. "RESTLESS LEGS OTC"    . pantoprazole (PROTONIX) 40 MG tablet Take 1 tablet (40 mg total) by mouth 2 (two) times daily before a meal. 90 tablet 1  . tamsulosin (FLOMAX) 0.4 MG CAPS capsule Take 1 capsule (0.4 mg total) by mouth daily. 90 capsule 3   No current facility-administered medications for this visit.     Allergies as of 11/25/2018 - Review Complete 11/25/2018  Allergen Reaction Noted  . Cymbalta [duloxetine hcl] Other (See Comments) 06/05/2017    Past Medical History:  Diagnosis Date  . Diabetes mellitus without complication ([place])   . GERD (gastroesophageal reflux  disease)   . Hyperlipidemia   . Hypertension   . Rash of entire body 03/2016  . Sleep apnea    NO CPAP per pt    Past Surgical History:  Procedure Laterality Date  . BACK SURGERY  1993   lumbar  . CERVICAL SPINE SURGERY  2000  . HAND SURGERY      Family History  Problem Relation Age of Onset  . Pancreatic cancer Mother   . CAD Father   . Colon cancer Paternal Grandfather     Social History   Socioeconomic History  . Marital status: Significant Other    Spouse name: Not on file  . Number of children: Not on file  . Years of education: Not on file  . Highest education level: Not on file  Occupational History  . Occupation: Furniture conservator/restorer  . Financial resource strain: Not on file  . Food insecurity:    Worry: Not on file    Inability: Not on file  . Transportation needs:    Medical: Not on file    Non-medical: Not on file  Tobacco Use  . Smoking status: Former Smoker    Last attempt to quit: 10/16/1988    Years since quitting: 30.1  . Smokeless tobacco: Never Used  Substance and Sexual Activity  . Alcohol use: No  . Drug use: No  . Sexual activity: Not on file  Lifestyle  . Physical activity:    Days per week: Not on file    Minutes per session: Not on file  . Stress: Not on file  Relationships  . Social connections:    Talks on phone: Not on file    Gets together: Not on file    Attends religious service: Not on file    Active member of club or organization: Not on file    Attends meetings of clubs or organizations: Not on file    Relationship status: Not on file  . Intimate partner violence:    Fear of current or ex partner: Not on file    Emotionally abused: Not on file    Physically abused: Not on file    Forced sexual activity: Not on file  Other Topics Concern  . Not on file  Social History Narrative   Volunteers as firefighter      ROS:  General: Negative for anorexia, weight loss, fever, chills, fatigue,  weakness. Eyes: Negative for vision changes.  ENT: Negative for hoarseness, difficulty swallowing , nasal congestion. CV: Negative for chest pain, angina, palpitations, dyspnea on exertion, peripheral edema.  Respiratory: Negative for dyspnea at rest, dyspnea on exertion, cough, sputum, wheezing.  GI: See history of present illness. GU:  Negative for dysuria, hematuria, urinary incontinence, urinary frequency, nocturnal urination.  MS:  Negative for joint pain, low back pain.  Derm: Negative for rash or itching.  Neuro: Negative for weakness, abnormal sensation, seizure, frequent headaches, memory loss, confusion.  Psych: Negative for anxiety, depression, suicidal ideation, hallucinations.  Endo: Negative for unusual weight change.  Heme: Negative for bruising or bleeding. Allergy: Negative for rash or hives.    Physical Examination:  BP (!) 149/68   Pulse 69   Temp (!) 97.2 F (36.2 C) (Oral)   Ht 5' 10"  (1.778 m)   Wt 241 lb 3.2 oz (109.4 kg)   BMI 34.61 kg/m    General: Well-nourished, well-developed in no acute distress.  Head: Normocephalic, atraumatic.   Eyes: Conjunctiva pink, no icterus. Mouth: Oropharyngeal mucosa moist and pink , no lesions erythema or exudate. Neck: Supple without thyromegaly, masses, or lymphadenopathy.  Lungs: Clear to auscultation bilaterally.  Heart: Regular rate and rhythm, no murmurs rubs or gallops.  Abdomen: Bowel sounds are normal, nontender, nondistended, no hepatosplenomegaly or masses, no abdominal bruits or    hernia , no rebound or guarding.   Rectal: Not performed Extremities: No lower extremity edema. No clubbing or deformities.  Neuro: Alert and oriented x 4 , grossly normal neurologically.  Skin: Warm and dry, no rash or jaundice.   Psych: Alert and cooperative, normal mood and affect.  Labs: Lab Results  Component Value Date   WBC 5.8 11/19/2018   HGB 12.1 (L) 11/19/2018   HCT 36.2 (L) 11/19/2018   MCV 87 11/19/2018   PLT  206 11/19/2018   Lab Results  Component Value Date   CREATININE 0.74 (L) 11/19/2018   BUN 15 11/19/2018   NA 137 11/19/2018   K 4.1 11/19/2018   CL 101 11/19/2018   CO2 21 11/19/2018   Lab Results  Component Value Date   IRON 42 11/19/2018   TIBC 339 11/19/2018   FERRITIN 16 (L) 11/19/2018   Lab Results  Component Value Date   ALT 37 11/19/2018   AST 29 11/19/2018   ALKPHOS 55 11/19/2018   BILITOT <0.2 11/19/2018   Lab Results  Component Value Date   VITAMINB12 469 11/19/2018   Lab Results  Component Value Date   HGBA1C 6.9 11/19/2018     Imaging Studies: No results found.

## 2018-11-25 NOTE — Patient Instructions (Addendum)
I would encourage you to take pantoprazole twice daily before breakfast and before evening meal to help with your night time symptoms. New RX sent to your pharmacy.   Colonoscopy and upper endoscopy as scheduled. See separate instructions.

## 2018-11-25 NOTE — Assessment & Plan Note (Addendum)
Pleasant 57 year old gentleman with history of chronic GERD, previously well controlled up until the last several months.  More recently on pantoprazole 40 mg daily in the morning, was on Pepcid at bedtime but he stopped this because it was not helping.  Majority of his symptoms occurring at nighttime after he lays down.  Describes heartburn, regurgitation and belching of old food.  If he skips PPI for more than 2 days he has epigastric pain.  He denies any esophageal dysphagia.    He also has epigastric pain in the setting of NSAID use.  Recently determined to have mild iron deficiency anemia as well.  Cannot exclude underlying gastritis, peptic ulcer disease.  Offered him an upper endoscopy for further evaluation of his symptoms.  I have discussed the risks, alternatives, benefits with regards to but not limited to the risk of reaction to medication, bleeding, infection, perforation and the patient is agreeable to proceed. Written consent to be obtained.  Also recommended increasing pantoprazole to 40 mg before breakfast and 40 mg before his evening meal daily.  Prescription sent to the pharmacy.

## 2018-11-25 NOTE — Progress Notes (Signed)
cc'd to pcp 

## 2018-11-25 NOTE — Telephone Encounter (Signed)
Returned pt call pt states he is aware of results and doesn't have any questions or concerns

## 2018-12-02 ENCOUNTER — Other Ambulatory Visit: Payer: Self-pay | Admitting: Internal Medicine

## 2018-12-02 DIAGNOSIS — D509 Iron deficiency anemia, unspecified: Secondary | ICD-10-CM

## 2018-12-25 MED FILL — ?PANTOPRAZOLE SOD DR 40MG T: 40 | 30 days supply | Qty: 30 | Fill #1

## 2018-12-25 MED FILL — FERROUS SULFATE 325 MG TAB: 325 (65 FE) | 30 days supply | Qty: 30 | Fill #1

## 2018-12-25 MED FILL — LISINOPRIL 20 MG TAB: 20 | 30 days supply | Qty: 30 | Fill #9

## 2018-12-25 MED FILL — TAMSULOSIN HCL 0.4 MG CAP: 0.4 | 30 days supply | Qty: 30 | Fill #9

## 2019-01-08 ENCOUNTER — Ambulatory Visit: Payer: Self-pay

## 2019-01-18 MED FILL — ?GLIPIZIDE 5MG TABLET: 5 | 30 days supply | Qty: 90 | Fill #1

## 2019-01-21 ENCOUNTER — Encounter: Payer: Self-pay | Admitting: Internal Medicine

## 2019-01-25 ENCOUNTER — Other Ambulatory Visit: Payer: Self-pay | Admitting: Critical Care Medicine

## 2019-01-25 MED FILL — LISINOPRIL 20 MG TAB: 20 | 30 days supply | Qty: 30 | Fill #10

## 2019-01-25 MED FILL — TAMSULOSIN HCL 0.4 MG CAP: 0.4 | 30 days supply | Qty: 30 | Fill #10

## 2019-01-25 MED FILL — FAMOTIDINE 20 MG TABLET: 20 | 30 days supply | Qty: 30 | Fill #1

## 2019-01-30 ENCOUNTER — Telehealth: Payer: Self-pay

## 2019-01-30 NOTE — Telephone Encounter (Signed)
Called pt, he hasn't started the study medication yet. He is unsure of name of med, it hasn't been approved by FDA. He has an appt 02/04/19 for a breath test and he will be given eggs with seaweed. He's unsure if he will start the new medication next week. He had to stop Pantoprazole for the study and was started on Pepcid. However, Pepcid gave him diarrhea so he stopped it. Now taking Mylanta PRN. He wants to make sure it will be ok for him to have TCS 02/07/19.

## 2019-01-30 NOTE — Telephone Encounter (Signed)
Pt has questions about his procedure next week with SF.He's been taking a medication for a study. Pt can't recall the name of the medicine, he said it's suppose to help digest food. The study made him d/c iron per the study and took the medication that is suppose to help with digestion. Please call. [phone] to help pt with his questions.

## 2019-02-01 ENCOUNTER — Encounter: Payer: Self-pay | Admitting: Internal Medicine

## 2019-02-03 NOTE — Telephone Encounter (Signed)
The egg test and breath test on 4/21 will not interfere with TCS for 02/07/19.   I would tell patient that he should let the study coordinator know he is on schedule for TCS 02/07/19 and preferably not start new medication until after that.   IS [facility] STILL PERFORMING HIS TCS THIS WEEK WITH COVID 19??

## 2019-02-03 NOTE — Telephone Encounter (Signed)
noted 

## 2019-02-03 NOTE — Telephone Encounter (Signed)
Called and informed pt. He decided over the weekend not to do the study medication.  [facility] didn't advise to reschedule TCS.

## 2019-02-03 NOTE — Progress Notes (Unsigned)
Called the patient and left a vm for him to call back made the appointment for in person

## 2019-02-04 ENCOUNTER — Ambulatory Visit: Payer: Self-pay | Admitting: Primary Care

## 2019-02-04 ENCOUNTER — Encounter: Payer: Self-pay | Admitting: Internal Medicine

## 2019-02-04 ENCOUNTER — Other Ambulatory Visit: Payer: Self-pay

## 2019-02-04 ENCOUNTER — Telehealth: Payer: Self-pay | Admitting: *Deleted

## 2019-02-04 NOTE — Telephone Encounter (Signed)
Spoke with patient to see if he would be able to move up procedure time on 4/24 to 9:15am. He stated that was fine. Patient aware to arrive at 8:15am. He will drink 2nd half of prep at 4:15am and NPO after 6:15am. He voiced understanding. Called endo, spoke with Selena Batten and made aware.

## 2019-02-04 NOTE — Telephone Encounter (Signed)
REVIEWED AGAIN APR 21. PROCEED WITH TCS/POSSIBLE EGD DUE TO OBSCURE GI BLEED/NEW ONSET IRON DEFICIENCY ANEMIA ON APR 24.

## 2019-02-04 NOTE — Telephone Encounter (Signed)
Noted  

## 2019-02-05 NOTE — Telephone Encounter (Signed)
TCS/POSSIBLE EGD APR 24 FOR IRON DEFICIENCY ANEMIA/EVALUATE FOR OCCULT MALIGNANCY.

## 2019-02-05 NOTE — Telephone Encounter (Signed)
Noted  

## 2019-02-07 ENCOUNTER — Encounter (HOSPITAL_COMMUNITY): Payer: Self-pay | Admitting: *Deleted

## 2019-02-07 ENCOUNTER — Other Ambulatory Visit: Payer: Self-pay

## 2019-02-07 ENCOUNTER — Encounter (HOSPITAL_COMMUNITY)
Admission: RE | Disposition: A | Discharge status: Home / Self Care | Payer: Self-pay | Source: Home / Self Care | Attending: Gastroenterology

## 2019-02-07 ENCOUNTER — Ambulatory Visit (HOSPITAL_COMMUNITY)
Admission: RE | Admit: 2019-02-07 | Discharge: 2019-02-07 | Disposition: A | Discharge status: Home / Self Care | Payer: Self-pay | Attending: Gastroenterology | Admitting: Gastroenterology

## 2019-02-07 DIAGNOSIS — Z7984 Long term (current) use of oral hypoglycemic drugs: Secondary | ICD-10-CM | POA: Insufficient documentation

## 2019-02-07 DIAGNOSIS — K2950 Unspecified chronic gastritis without bleeding: Secondary | ICD-10-CM | POA: Insufficient documentation

## 2019-02-07 DIAGNOSIS — Q438 Other specified congenital malformations of intestine: Secondary | ICD-10-CM | POA: Insufficient documentation

## 2019-02-07 DIAGNOSIS — K2980 Duodenitis without bleeding: Secondary | ICD-10-CM

## 2019-02-07 DIAGNOSIS — K644 Residual hemorrhoidal skin tags: Secondary | ICD-10-CM | POA: Insufficient documentation

## 2019-02-07 DIAGNOSIS — K2270 Barrett's esophagus without dysplasia: Secondary | ICD-10-CM

## 2019-02-07 DIAGNOSIS — K2970 Gastritis, unspecified, without bleeding: Secondary | ICD-10-CM

## 2019-02-07 DIAGNOSIS — D509 Iron deficiency anemia, unspecified: Secondary | ICD-10-CM | POA: Insufficient documentation

## 2019-02-07 DIAGNOSIS — K5710 Diverticulosis of small intestine without perforation or abscess without bleeding: Secondary | ICD-10-CM | POA: Insufficient documentation

## 2019-02-07 DIAGNOSIS — E119 Type 2 diabetes mellitus without complications: Secondary | ICD-10-CM | POA: Insufficient documentation

## 2019-02-07 DIAGNOSIS — K648 Other hemorrhoids: Secondary | ICD-10-CM

## 2019-02-07 DIAGNOSIS — Z87891 Personal history of nicotine dependence: Secondary | ICD-10-CM | POA: Insufficient documentation

## 2019-02-07 DIAGNOSIS — Z800 Family history of malignant neoplasm of digestive organs: Secondary | ICD-10-CM | POA: Insufficient documentation

## 2019-02-07 DIAGNOSIS — K219 Gastro-esophageal reflux disease without esophagitis: Secondary | ICD-10-CM | POA: Insufficient documentation

## 2019-02-07 DIAGNOSIS — K228 Other specified diseases of esophagus: Secondary | ICD-10-CM

## 2019-02-07 DIAGNOSIS — I10 Essential (primary) hypertension: Secondary | ICD-10-CM | POA: Insufficient documentation

## 2019-02-07 DIAGNOSIS — G4730 Sleep apnea, unspecified: Secondary | ICD-10-CM | POA: Insufficient documentation

## 2019-02-07 DIAGNOSIS — Z8249 Family history of ischemic heart disease and other diseases of the circulatory system: Secondary | ICD-10-CM | POA: Insufficient documentation

## 2019-02-07 DIAGNOSIS — Z888 Allergy status to other drugs, medicaments and biological substances status: Secondary | ICD-10-CM | POA: Insufficient documentation

## 2019-02-07 DIAGNOSIS — Z79899 Other long term (current) drug therapy: Secondary | ICD-10-CM | POA: Insufficient documentation

## 2019-02-07 DIAGNOSIS — K315 Obstruction of duodenum: Secondary | ICD-10-CM | POA: Insufficient documentation

## 2019-02-07 DIAGNOSIS — E785 Hyperlipidemia, unspecified: Secondary | ICD-10-CM | POA: Insufficient documentation

## 2019-02-07 HISTORY — PX: ESOPHAGOGASTRODUODENOSCOPY: SHX5428

## 2019-02-07 HISTORY — PX: BIOPSY: SHX5522

## 2019-02-07 HISTORY — PX: COLONOSCOPY: SHX5424

## 2019-02-07 LAB — GLUCOSE, CAPILLARY: Glucose-Capillary: 184 mg/dL — ABNORMAL HIGH (ref 70–99)

## 2019-02-07 SURGERY — COLONOSCOPY
Anesthesia: Moderate Sedation

## 2019-02-07 MED ORDER — MEPERIDINE HCL 100 MG/ML IJ SOLN
INTRAMUSCULAR | Status: DC | PRN
  Administered 2019-02-07: 50 mg
  Administered 2019-02-07 (×2): 25 mg

## 2019-02-07 MED ORDER — LIDOCAINE VISCOUS HCL 2 % MT SOLN
OROMUCOSAL | Status: AC
  Filled 2019-02-07: qty 15

## 2019-02-07 MED ORDER — MIDAZOLAM HCL 5 MG/5ML IJ SOLN
INTRAMUSCULAR | Status: AC
  Filled 2019-02-07: qty 10

## 2019-02-07 MED ORDER — MIDAZOLAM HCL 5 MG/5ML IJ SOLN
INTRAMUSCULAR | Status: DC | PRN
  Administered 2019-02-07: 2 mg via INTRAVENOUS
  Administered 2019-02-07: 1 mg via INTRAVENOUS

## 2019-02-07 MED ORDER — MIDAZOLAM HCL 5 MG/5ML IJ SOLN
INTRAMUSCULAR | Status: AC
  Filled 2019-02-07: qty 5

## 2019-02-07 MED ORDER — PROMETHAZINE HCL 25 MG/ML IJ SOLN
INTRAMUSCULAR | Status: AC
  Filled 2019-02-07: qty 1

## 2019-02-07 MED ORDER — MEPERIDINE HCL 100 MG/ML IJ SOLN
INTRAMUSCULAR | Status: AC
  Filled 2019-02-07: qty 2

## 2019-02-07 MED ORDER — PROMETHAZINE HCL 25 MG/ML IJ SOLN
INTRAMUSCULAR | Status: DC | PRN
  Administered 2019-02-07: 12.5 mg via INTRAVENOUS

## 2019-02-07 MED ORDER — SODIUM CHLORIDE 0.9 % IV SOLN
INTRAVENOUS | Status: DC
  Administered 2019-02-07: 10:00:00 via INTRAVENOUS

## 2019-02-07 MED ORDER — STERILE WATER FOR IRRIGATION IR SOLN
Status: DC | PRN
  Administered 2019-02-07: 11:00:00

## 2019-02-07 MED ORDER — PANTOPRAZOLE SODIUM 40 MG PO TBEC: 40.0000 mg | DELAYED_RELEASE_TABLET | Freq: Two times a day (BID) | ORAL | 3 refills | Status: DC

## 2019-02-07 MED ORDER — LIDOCAINE VISCOUS HCL 2 % MT SOLN
OROMUCOSAL | Status: DC | PRN
  Administered 2019-02-07: 1 via OROMUCOSAL

## 2019-02-07 NOTE — Op Note (Signed)
[facility] Patient Name: Jeremy [name] Procedure Date: 02/07/2019 9:21 AM MRN: [id number] Date of Birth: 08/31/1961 Attending MD: Jonette Eva MD, MD CSN: [id number] Age: 58 Admit Type: Outpatient Procedure:                Colonoscopy, DIAGNOSTIC Indications:              Iron deficiency anemia: FEB 2020 Hb 12.1, FERRITIN                            16 Providers:                Jonette Eva MD, MD, Jannett Celestine, RN, Edythe Clarity,                            Technician, Pandora Leiter, Technician Referring MD:             Marcine Matar Medicines:                Meperidine 75 mg IV, Midazolam 2 mg IV,                            Promethazine 12.5 mg IV Complications:            No immediate complications. Estimated Blood Loss:     Estimated blood loss: none. Procedure:                Pre-Anesthesia Assessment:                           - Prior to the procedure, a History and Physical                            was performed, and patient medications and                            allergies were reviewed. The patient's tolerance of                            previous anesthesia was also reviewed. The risks                            and benefits of the procedure and the sedation                            options and risks were discussed with the patient.                            All questions were answered, and informed consent                            was obtained. Prior Anticoagulants: The patient has                            taken no previous anticoagulant or antiplatelet  agents except for NSAID medication. ASA Grade                            Assessment: II - A patient with mild systemic                            disease. After reviewing the risks and benefits,                            the patient was deemed in satisfactory condition to                            undergo the procedure. After obtaining informed                            consent,  the colonoscope was passed under direct                            vision. Throughout the procedure, the patient's                            blood pressure, pulse, and oxygen saturations were                            monitored continuously. The CF-[id number] ([id number])                            scope was introduced through the anus and advanced                            to the 10 cm into the ileum. The colonoscopy was                            performed without difficulty. The patient tolerated                            the procedure well. The quality of the bowel                            preparation was excellent. The terminal ileum,                            ileocecal valve, appendiceal orifice, and rectum                            were photographed. Scope In: Scope Out: 10:53:46 AM Scope Withdrawal Time: 0 hours 19 minutes 41 seconds  Findings:      The terminal ileum appeared normal.      The recto-sigmoid colon and sigmoid colon were mildly redundant.      External hemorrhoids were found. The hemorrhoids were small.      The exam was otherwise without abnormality. Impression:               - NO SOURCE FOR FEDA IDENTIFIED.                           -  MILDLY Redundant LEFT colon.                           - External hemorrhoids. Moderate Sedation:      Moderate (conscious) sedation was administered by the endoscopy nurse       and supervised by the endoscopist. The following parameters were       monitored: oxygen saturation, heart rate, blood pressure, and response       to care. Total physician intraservice time was 72 minutes. Recommendation:           - Patient has a contact number available for                            emergencies. The signs and symptoms of potential                            delayed complications were discussed with the                            patient. Return to normal activities tomorrow.                            Written discharge instructions  were provided to the                            patient.                           - High fiber diet.                           - Continue present medications.                           - Repeat colonoscopy in 10 years for surveillance.                            PROCEED WITH EGD.                           - Return to GI office in 4 months. Procedure Code(s):        --- Professional ---                           [phone], Colonoscopy, flexible; diagnostic, including                            collection of specimen(s) by brushing or washing,                            when performed (separate procedure)                           99153, Moderate sedation; each additional 15  minutes intraservice time                           99153, Moderate sedation; each additional 15                            minutes intraservice time                           99153, Moderate sedation; each additional 15                            minutes intraservice time                           99153, Moderate sedation; each additional 15                            minutes intraservice time                           G0500, Moderate sedation services provided by the                            same physician or other qualified health care                            professional performing a gastrointestinal                            endoscopic service that sedation supports,                            requiring the presence of an independent trained                            observer to assist in the monitoring of the                            patient's level of consciousness and physiological                            status; initial 15 minutes of intra-service time;                            patient age 50 years or older (additional time may                            be reported with 16109, as appropriate) Diagnosis Code(s):        --- Professional ---                           K64.4,  Residual hemorrhoidal skin tags                           D50.9, Iron deficiency anemia, unspecified  Q43.8, Other specified congenital malformations of                            intestine CPT copyright 2019 [employer]. All rights reserved. The codes documented in this report are preliminary and upon coder review may  be revised to meet current compliance requirements. Jonette Eva, MD Jonette Eva MD, MD 02/07/2019 11:49:35 AM This report has been signed electronically. Number of Addenda: 0

## 2019-02-07 NOTE — H&P (Signed)
Primary Care Physician:  [name], MD Primary Gastroenterologist:  Dr. Oneida Alar  Pre-Procedure History & Physical: HPI:  Jeremy [name] is a 58 y.o. male here for IRON DEFICIENCY ANEMIA.  Past Medical History:  Diagnosis Date  . Diabetes mellitus without complication ([place])   . GERD (gastroesophageal reflux disease)   . Hyperlipidemia   . Hypertension   . Rash of entire body 03/2016  . Sleep apnea    NO CPAP per pt    Past Surgical History:  Procedure Laterality Date  . BACK SURGERY  1993   lumbar  . CERVICAL SPINE SURGERY  2000  . HAND SURGERY      Prior to Admission medications   Medication Sig Start Date End Date Taking? Authorizing Provider  acetaminophen (TYLENOL) 500 MG tablet Take 1,000 mg by mouth 2 (two) times daily.    Yes [provider]  alum & mag hydroxide-simeth (MAALOX/MYLANTA) 200-200-20 MG/5ML suspension Take 15 mLs by mouth every 6 (six) hours as needed for indigestion or heartburn.   Yes [provider]  famotidine (PEPCID) 20 MG tablet Take 20 mg by mouth at bedtime.   Yes [provider]  ferrous sulfate 325 (65 FE) MG tablet Take 1 tablet (325 mg total) by mouth daily with breakfast. 11/22/18  Yes [name], MD  glipiZIDE (GLUCOTROL) 5 MG tablet Take 2 tablets PO in the mornings and 1 tablet in the evenings. Patient taking differently: Take 5-10 mg by mouth See admin instructions. Take 10 mg in the morning and 5 mg at night 11/19/18  Yes [name], MD  Liniments (BLUE-EMU SUPER STRENGTH EX) Apply 1 application topically daily as needed (pain).   Yes [provider]  lisinopril (PRINIVIL,ZESTRIL) 20 MG tablet Take 1 tablet (20 mg total) by mouth daily. Stop Lisinopril/HCTZ 01/31/18  Yes [name], MD  loperamide (IMODIUM A-D) 2 MG tablet Take 2-4 mg by mouth as needed for diarrhea or loose stools.   Yes [provider]  loratadine (CLARITIN) 10 MG tablet Take 10 mg by mouth daily.    Yes [provider]  metFORMIN (GLUCOPHAGE) 1000 MG tablet Take 0.5 tablets (500 mg total) by mouth 2 (two) times daily with a meal. 11/19/18  Yes [name], MD  naproxen sodium (ALEVE) 220 MG tablet Take 440 mg by mouth 2 (two) times daily.   Yes [provider]  OVER THE COUNTER MEDICATION Take 2 tablets by mouth at bedtime. "RESTLESS LEGS PM OTC"   Yes [provider]  pantoprazole (PROTONIX) 40 MG tablet Take 1 tablet (40 mg total) by mouth 2 (two) times daily before a meal. Patient taking differently: Take 40 mg by mouth daily.  11/25/18  Yes Mahala Menghini, PA-C  Potassium 99 MG TABS Take 99 mg by mouth daily.   Yes [provider]  tamsulosin (FLOMAX) 0.4 MG CAPS capsule Take 1 capsule (0.4 mg total) by mouth daily. Patient taking differently: Take 0.4 mg by mouth every evening.  07/08/18  Yes Scot Jun, FNP  atorvastatin (LIPITOR) 40 MG tablet Take 1 tablet (40 mg total) by mouth daily. 01/31/18   [name], MD  Blood Glucose Monitoring Suppl (TRUE METRIX METER) w/Device KIT 1 each by Does not apply route 4 (four) times daily -  before meals and at bedtime. 04/12/16   Elsie Stain, MD  diphenhydrAMINE (BENADRYL) 25 MG tablet Take 50 mg by mouth every 6 (six) hours as needed for  itching or allergies (bee stings).     [provider]  glucose blood (TRUE METRIX BLOOD GLUCOSE TEST) test strip Use as instructed 04/12/16   Elsie Stain, MD  ondansetron (ZOFRAN) 8 MG tablet Take 1 tablet (8 mg total) by mouth every 8 (eight) hours as needed for nausea or vomiting. Patient not taking: Reported on 01/24/2019 09/08/18   Raylene Everts, MD    Allergies as of 11/25/2018 - Review Complete 11/25/2018  Allergen Reaction Noted  . Cymbalta [duloxetine hcl] Other (See Comments) 06/05/2017    Family History  Problem Relation Age of Onset  . Pancreatic cancer Mother   . CAD Father   . Colon cancer Paternal Grandfather      Social History   Socioeconomic History  . Marital status: Significant Other    Spouse name: Not on file  . Number of children: Not on file  . Years of education: Not on file  . Highest education level: Not on file  Occupational History  . Occupation: Furniture conservator/restorer  . Financial resource strain: Not on file  . Food insecurity:    Worry: Not on file    Inability: Not on file  . Transportation needs:    Medical: Not on file    Non-medical: Not on file  Tobacco Use  . Smoking status: Former Smoker    Last attempt to quit: 10/16/1988    Years since quitting: 30.3  . Smokeless tobacco: Never Used  Substance and Sexual Activity  . Alcohol use: No  . Drug use: No  . Sexual activity: Not on file  Lifestyle  . Physical activity:    Days per week: Not on file    Minutes per session: Not on file  . Stress: Not on file  Relationships  . Social connections:    Talks on phone: Not on file    Gets together: Not on file    Attends religious service: Not on file    Active member of club or organization: Not on file    Attends meetings of clubs or organizations: Not on file    Relationship status: Not on file  . Intimate partner violence:    Fear of current or ex partner: Not on file    Emotionally abused: Not on file    Physically abused: Not on file    Forced sexual activity: Not on file  Other Topics Concern  . Not on file  Social History Narrative   Volunteers as Airline pilot    Review of Systems: See HPI, otherwise negative ROS   Physical Exam: BP (!) 162/84   Pulse (!) 59   Temp 98.3 F (36.8 C) (Oral)   Resp 19   Ht 5' 10"  (1.778 m)   Wt 103.4 kg   SpO2 100%   BMI 32.71 kg/m  General:   Alert,  pleasant and cooperative in NAD Head:  Normocephalic and atraumatic. Neck:  Supple; Lungs:  Clear throughout to auscultation.    Heart:  Regular rate and rhythm. Abdomen:  Soft, nontender and nondistended. Normal bowel sounds, without guarding,  and without rebound.   Neurologic:  Alert and  oriented x4;  grossly normal neurologically.  Impression/Plan:    IRON DEFICIENCY ANEMIA.  Plan:  1. TCS TODAY DISCUSSED PROCEDURE, BENEFITS, & RISKS: < 1% chance of medication reaction, bleeding, perforation, ASPIRATION, or rupture of spleen/liver requiring surgery to fix it and missed polyps < 1 cm 10-20% of the time.

## 2019-02-07 NOTE — Discharge Instructions (Signed)
NO OBVIOUS SOURCE FOR YOUR LOW BLOOD COUNT/IRON WAS IDENTIFIED. YOU HAVE EXTERNAL hemorrhoids. YOU DID NOT HAVE ANY POLYPS. 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 You have gastritis MOST LIKELY DUE TO ASPIRIN AND NAPROXEN. YOU HAVE A DUODENAL WEB THAT CAN CAUSE INTERMITTENT OBSTRUCTION AND ABDOMINAL PAIN. IT CAME FROM HAVING ULCERS IN THE PAST. YOU HAVE DIVERTICULOSIS IN YOUR SMALL BOWL.  I BIOPSIED YOUR ESOPHAGUS, STOMACH, AND SMALL BOWEL.    DRINK WATER TO KEEP YOUR URINE LIGHT YELLOW.  FOLLOW A HIGH FIBER/LOW FAT DIET. AVOID ITEMS THAT CAUSE BLOATING. SEE INFO BELOW.  CONTINUE PROTONIX. TAKE 30 MINUTES PRIOR TO MEALS TWICE DAILY.   YOUR BIOPSY RESULTS WILL BE BACK IN 5 BUSINESS DAYS. IF YOUR BIOPSIES DO NOT REVEAL A SOURCE FOR LOW BLOOD COUNT/IRON, YOU WILL NEED THE SMALL BOWEL CAPSULE STUDY placed via upper endoscopy.  FOLLOW UP IN 4 MOS.   NEXT UPPER ENDOSCOPY in 3-5 years.  Next colonoscopy in 10 years.   ENDOSCOPY Care After Read the instructions outlined below and refer to this sheet in the next week. These discharge instructions provide you with general information on caring for yourself after you leave the hospital. While your treatment has been planned according to the most current medical practices available, unavoidable complications occasionally occur. If you have any problems or questions after discharge, call DR. Jammie Troup, [phone].  ACTIVITY  You may resume your regular activity, but move at a slower pace for the next 24 hours.   Take frequent rest periods for the next 24 hours.   Walking will help get rid of the air and reduce the bloated feeling in your belly (abdomen).   No driving for 24 hours (because of the medicine (anesthesia) used during the test).   You may shower.   Do not sign any important legal documents or operate any machinery for 24 hours (because of the anesthesia used during the test).    NUTRITION  Drink plenty of fluids.   You may  resume your normal diet as instructed by your doctor.   Begin with a light meal and progress to your normal diet. Heavy or fried foods are harder to digest and may make you feel sick to your stomach (nauseated).   Avoid alcoholic beverages for 24 hours or as instructed.    MEDICATIONS  You may resume your normal medications.   WHAT YOU CAN EXPECT TODAY  Some feelings of bloating in the abdomen.   Passage of more gas than usual.   Spotting of blood in your stool or on the toilet paper  .  IF YOU HAD POLYPS REMOVED DURING THE ENDOSCOPY:  Eat a soft diet IF YOU HAVE NAUSEA, BLOATING, ABDOMINAL PAIN, OR VOMITING.    FINDING OUT THE RESULTS OF YOUR TEST Not all test results are available during your visit. DR. Darrick PennaFIELDS WILL CALL YOU WITHIN 14 DAYS OF YOUR PROCEDUE WITH YOUR RESULTS. Do not assume everything is normal if you have not heard from DR. Karlisa Gaubert IN TWO WEEKS, CALL HER OFFICE AT [phone].  SEEK IMMEDIATE MEDICAL ATTENTION AND CALL THE OFFICE: [phone] IF:  You have more than a spotting of blood in your stool.   Your belly is swollen (abdominal distention).   You are nauseated or vomiting.   You have a temperature over 101F.   You have abdominal pain or discomfort that is severe or gets worse throughout the day.   Gastritis  Gastritis is an inflammation (the body's way of reacting to injury and/or infection) of  the stomach. It is often caused by viral or bacterial (germ) infections. It can also be caused BY ALCOHOL, ASPIRIN, BC/GOODY POWDER'S, (IBUPROFEN) MOTRIN, OR ALEVE (NAPROXEN), chemicals (including alcohol), SPICY FOODS, and medications. This illness may be associated with generalized malaise (feeling tired, not well), UPPER ABDOMINAL STOMACH cramps, and fever. One common bacterial cause of gastritis is an organism known as H. Pylori. This can be treated with antibiotics.    High-Fiber Diet A high-fiber diet changes your normal diet to include more whole  grains, legumes, fruits, and vegetables. Changes in the diet involve replacing refined carbohydrates with unrefined foods. The calorie level of the diet is essentially unchanged. The Dietary Reference Intake (recommended amount) for adult males is 38 grams per day. For adult females, it is 25 grams per day. Pregnant and lactating women should consume 28 grams of fiber per day. Fiber is the intact part of a plant that is not broken down during digestion. Functional fiber is fiber that has been isolated from the plant to provide a beneficial effect in the body.  PURPOSE  Increase stool bulk.   Ease and regulate bowel movements.   Lower cholesterol.   REDUCE RISK OF COLON CANCER  INDICATIONS THAT YOU NEED MORE FIBER  Constipation and hemorrhoids.   Uncomplicated diverticulosis (intestine condition) and irritable bowel syndrome.   Weight management.   As a protective measure against hardening of the arteries (atherosclerosis), diabetes, and cancer.   GUIDELINES FOR INCREASING FIBER IN THE DIET  Start adding fiber to the diet slowly. A gradual increase of about 5 more grams (2 slices of whole-wheat bread, 2 servings of most fruits or vegetables, or 1 bowl of high-fiber cereal) per day is best. Too rapid an increase in fiber may result in constipation, flatulence, and bloating.   Drink enough water and fluids to keep your urine clear or pale yellow. Water, juice, or caffeine-free drinks are recommended. Not drinking enough fluid may cause constipation.   Eat a variety of high-fiber foods rather than one type of fiber.   Try to increase your intake of fiber through using high-fiber foods rather than fiber pills or supplements that contain small amounts of fiber.   The goal is to change the types of food eaten. Do not supplement your present diet with high-fiber foods, but replace foods in your present diet.   INCLUDE A VARIETY OF FIBER SOURCES  Replace refined and processed grains with  whole grains, canned fruits with fresh fruits, and incorporate other fiber sources. White rice, white breads, and most bakery goods contain little or no fiber.   Brown whole-grain rice, buckwheat oats, and many fruits and vegetables are all good sources of fiber. These include: broccoli, Brussels sprouts, cabbage, cauliflower, beets, sweet potatoes, white potatoes (skin on), carrots, tomatoes, eggplant, squash, berries, fresh fruits, and dried fruits.   Cereals appear to be the richest source of fiber. Cereal fiber is found in whole grains and bran. Bran is the fiber-rich outer coat of cereal grain, which is largely removed in refining. In whole-grain cereals, the bran remains. In breakfast cereals, the largest amount of fiber is found in those with "bran" in their names. The fiber content is sometimes indicated on the label.   You may need to include additional fruits and vegetables each day.   In baking, for 1 cup white flour, you may use the following substitutions:   1 cup whole-wheat flour minus 2 tablespoons.   1/2 cup white flour plus 1/2 cup whole-wheat flour.  Low-Fat Diet BREADS, CEREALS, PASTA, RICE, DRIED PEAS, AND BEANS These products are high in carbohydrates and most are low in fat. Therefore, they can be increased in the diet as substitutes for fatty foods. They too, however, contain calories and should not be eaten in excess. Cereals can be eaten for snacks as well as for breakfast.  Include foods that contain fiber (fruits, vegetables, whole grains, and legumes). Research shows that fiber may lower blood cholesterol levels, especially the water-soluble fiber found in fruits, vegetables, oat products, and legumes. FRUITS AND VEGETABLES It is good to eat fruits and vegetables. Besides being sources of fiber, both are rich in vitamins and some minerals. They help you get the daily allowances of these nutrients. Fruits and vegetables can be used for snacks and  desserts. MEATS Limit lean meat, chicken, [place], and fish to no more than 6 ounces per day. Beef, Pork, and Lamb Use lean cuts of beef, pork, and lamb. Lean cuts include:  Extra-lean ground beef.  Arm roast.  Sirloin tip.  Center-cut ham.  Round steak.  Loin chops.  Rump roast.  Tenderloin.  Trim all fat off the outside of meats before cooking. It is not necessary to severely decrease the intake of red meat, but lean choices should be made. Lean meat is rich in protein and contains a highly absorbable form of iron. Premenopausal women, in particular, should avoid reducing lean red meat because this could increase the risk for low red blood cells (iron-deficiency anemia). The organ meats, such as liver, sweetbreads, kidneys, and brain are very rich in cholesterol. They should be limited. Chicken and [place] These are good sources of protein. The fat of poultry can be reduced by removing the skin and underlying fat layers before cooking. Chicken and [place] can be substituted for lean red meat in the diet. Poultry should not be fried or covered with high-fat sauces. Fish and Shellfish Fish is a good source of protein. Shellfish contain cholesterol, but they usually are low in saturated fatty acids. The preparation of fish is important. Like chicken and [place], they should not be fried or covered with high-fat sauces. EGGS Egg whites contain no fat or cholesterol. They can be eaten often. Try 1 to 2 egg whites instead of whole eggs in recipes or use egg substitutes that do not contain yolk. MILK AND DAIRY PRODUCTS Use skim or 1% milk instead of 2% or whole milk. Decrease whole milk, natural, and processed cheeses. Use nonfat or low-fat (2%) cottage cheese or low-fat cheeses made from vegetable oils. Choose nonfat or low-fat (1 to 2%) yogurt. Experiment with evaporated skim milk in recipes that call for heavy cream. Substitute low-fat yogurt or low-fat cottage cheese for sour cream in dips and salad  dressings. Have at least 2 servings of low-fat dairy products, such as 2 glasses of skim (or 1%) milk each day to help get your daily calcium intake.  FATS AND OILS Reduce the total intake of fats, especially saturated fat. Butterfat, lard, and beef fats are high in saturated fat and cholesterol. These should be avoided as much as possible. Vegetable fats do not contain cholesterol, but certain vegetable fats, such as coconut oil, palm oil, and palm kernel oil are very high in saturated fats. These should be limited. These fats are often used in bakery goods, processed foods, popcorn, oils, and nondairy creamers. Vegetable shortenings and some peanut butters contain hydrogenated oils, which are also saturated fats. Read the labels on these foods and check  for saturated vegetable oils. Unsaturated vegetable oils and fats do not raise blood cholesterol. However, they should be limited because they are fats and are high in calories. Total fat should still be limited to 30% of your daily caloric intake. Desirable liquid vegetable oils are corn oil, cottonseed oil, olive oil, canola oil, safflower oil, soybean oil, and sunflower oil. Peanut oil is not as good, but small amounts are acceptable. Buy a heart-healthy tub margarine that has no partially hydrogenated oils in the ingredients. Mayonnaise and salad dressings often are made from unsaturated fats, but they should also be limited because of their high calorie and fat content. Seeds, nuts, peanut butter, olives, and avocados are high in fat, but the fat is mainly the unsaturated type. These foods should be limited mainly to avoid excess calories and fat. OTHER EATING TIPS Snacks  Most sweets should be limited as snacks. They tend to be rich in calories and fats, and their caloric content outweighs their nutritional value. Some good choices in snacks are graham crackers, melba toast, soda crackers, bagels (no egg), English muffins, fruits, and vegetables.  These snacks are preferable to snack crackers, [place] fries, and chips. Popcorn should be air-popped or cooked in small amounts of liquid vegetable oil. Desserts Eat fruit, low-fat yogurt, and fruit ices. AVOID pastries, cake, and cookies. Sherbet, angel food cake, gelatin dessert, frozen low-fat yogurt, or other frozen products that do not contain saturated fat (pure fruit juice bars, frozen ice pops) are also acceptable.  COOKING METHODS Choose those methods that use little or no fat. They include: Poaching.  Braising.  Steaming.  Grilling.  Baking.  Stir-frying.  Broiling.  Microwaving.  Foods can be cooked in a nonstick pan without added fat, or use a nonfat cooking spray in regular cookware. Limit fried foods and avoid frying in saturated fat. Add moisture to lean meats by using water, broth, cooking wines, and other nonfat or low-fat sauces along with the cooking methods mentioned above. Soups and stews should be chilled after cooking. The fat that forms on top after a few hours in the refrigerator should be skimmed off. When preparing meals, avoid using excess salt. Salt can contribute to raising blood pressure in some people. EATING AWAY FROM HOME Order entres, potatoes, and vegetables without sauces or butter. When meat exceeds the size of a deck of cards (3 to 4 ounces), the rest can be taken home for another meal. Choose vegetable or fruit salads and ask for low-calorie salad dressings to be served on the side. Use dressings sparingly. Limit high-fat toppings, such as bacon, crumbled eggs, cheese, sunflower seeds, and olives. Ask for heart-healthy tub margarine instead of butter.   Hemorrhoids Hemorrhoids are dilated (enlarged) veins around the rectum. Sometimes clots will form in the veins. This makes them swollen and painful. These are called thrombosed hemorrhoids.  Causes of hemorrhoids include:  Constipation.   Straining to have a bowel movement.   HEAVY LIFTING  HOME  CARE INSTRUCTIONS  Eat a well balanced diet and drink 6 to 8 glasses of water every day to avoid constipation. You may also use a bulk laxative.   Avoid straining to have bowel movements.   Keep anal area dry and clean.   Do not use a donut shaped pillow or sit on the toilet for long periods. This increases blood pooling and pain.   Move your bowels when your body has the urge; this will require less straining and will decrease pain and pressure.

## 2019-02-07 NOTE — Op Note (Signed)
[facility] Patient Name: Jeremy [name] Procedure Date: 02/07/2019 10:58 AM MRN: [id number] Date of Birth: 03/07/1961 Attending MD: Jonette Eva MD, MD CSN: [id number] Age: 58 Admit Type: Outpatient Procedure:                Upper GI endoscopy WITH COLD FORCEPS BIOPSY Indications:              Epigastric abdominal pain, Suspected upper                            gastrointestinal bleeding in patient with                            unexplained iron deficiency anemia, Esophageal                            reflux Providers:                Jonette Eva MD, MD, Jannett Celestine, RN, Pandora Leiter, Technician, Edythe Clarity, Technician Referring MD:             Marcine Matar Medicines:                TCS + Meperidine 25 mg IV, Midazolam 1 mg IV Complications:            No immediate complications. Estimated Blood Loss:     Estimated blood loss was minimal. Procedure:                Pre-Anesthesia Assessment:                           - Prior to the procedure, a History and Physical                            was performed, and patient medications and                            allergies were reviewed. The patient's tolerance of                            previous anesthesia was also reviewed. The risks                            and benefits of the procedure and the sedation                            options and risks were discussed with the patient.                            All questions were answered, and informed consent                            was obtained. Prior Anticoagulants: The patient has  taken no previous anticoagulant or antiplatelet                            agents except for NSAID medication. ASA Grade                            Assessment: II - A patient with mild systemic                            disease. After reviewing the risks and benefits,                            the patient was deemed in satisfactory  condition to                            undergo the procedure. After obtaining informed                            consent, the endoscope was passed under direct                            vision. Throughout the procedure, the patient's                            blood pressure, pulse, and oxygen saturations were                            monitored continuously. The GIF-H190 ([id number]) was                            introduced through the mouth, and advanced to the                            second part of duodenum. The upper GI endoscopy was                            accomplished without difficulty. The patient                            tolerated the procedure well. Scope In: Scope Out: 11:26:44 AM Findings:      Circumferential salmon-colored mucosa was present from 30 to 40 cm and       scattered islands of salmon-colored mucosa were present at 38 cm. No       other visible abnormalities were present. The maximum longitudinal       extent of these esophageal mucosal changes was 10 cm in length. Mucosa       was biopsied with a cold forceps for histology in 4 quadrants at       intervals of 2 cm from 30 to 38 cm from the incisors. A total of 5       specimen bottles(#4: 38 CM, #5: 36 CM, #6: 34 CM #7:32 CM, #8: 30 CM       FROM THE INCISORS), were sent to pathology. EGJ 40 CM FROM THE INCISORS.  Diffuse mild inflammation characterized by congestion (edema), erosions       and erythema was found in the entire examined stomach. Biopsies(2: BODY,       1:INCISURA, 2: ANRUM) were taken with a cold forceps for Helicobacter       pylori testing AND ATROPHIC GASTRITIS.      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 Biopsies(3) for       histology were taken with a cold forceps for evaluation of celiac       disease.      A small non-bleeding diverticulum was found at the major papilla.      No  gross lesions were noted in the second portion of the duodenum.       Biopsies(4) for histology were taken with a cold forceps for evaluation       of celiac disease. Impression:               - Salmon-colored mucosa suspicious for long-segment                            Barrett's esophagus. Biopsied.                           - MODERATE NSAID Gastritis.                           - Acquired duodenal WEB LIKELY DUE TO PRIOR PUD.                           - Non-bleeding duodenal diverticulum.                           - NO OBVIOUS SOURCE FOR ANEMIA IDENTIFIED. Moderate Sedation:      Moderate (conscious) sedation was administered by the endoscopy nurse       and supervised by the endoscopist. The following parameters were       monitored: oxygen saturation, heart rate, blood pressure, and response       to care. Total physician intraservice time was 72 minutes. Recommendation:           - Patient has a contact number available for                            emergencies. The signs and symptoms of potential                            delayed complications were discussed with the                            patient. Return to normal activities tomorrow.                            Written discharge instructions were provided to the                            patient.                           -  High fiber diet and low fat diet. AVOID REFLUX                            TRIGGERS.                           - Continue present medications. PROTONIX BID.                           - Await pathology results.                           - Return to GI office in 4 months. Procedure Code(s):        --- Professional ---                           [phone], Esophagogastroduodenoscopy, flexible,                            transoral; with biopsy, single or multiple                           99153, Moderate sedation; each additional 15                            minutes intraservice time                            99153, Moderate sedation; each additional 15                            minutes intraservice time                           99153, Moderate sedation; each additional 15                            minutes intraservice time                           99153, Moderate sedation; each additional 15                            minutes intraservice time                           G0500, Moderate sedation services provided by the                            same physician or other qualified health care                            professional performing a gastrointestinal                            endoscopic service that sedation supports,  requiring the presence of an independent trained                            observer to assist in the monitoring of the                            patient's level of consciousness and physiological                            status; initial 15 minutes of intra-service time;                            patient age 81 years or older (additional time may                            be reported with [id number], as appropriate) Diagnosis Code(s):        --- Professional ---                           K22.8, Other specified diseases of esophagus                           K29.70, Gastritis, unspecified, without bleeding                           K31.5, Obstruction of duodenum                           R10.13, Epigastric pain                           D50.9, Iron deficiency anemia, unspecified                           K21.9, Gastro-esophageal reflux disease without                            esophagitis                           K57.10, Diverticulosis of small intestine without                            perforation or abscess without bleeding CPT copyright 2019 [employer]. All rights reserved. The codes documented in this report are preliminary and upon coder review may  be revised to meet current compliance requirements. Jonette EvaSandi Steven Veazie,  MD Jonette EvaSandi Marshall Roehrich MD, MD 02/07/2019 12:02:52 PM This report has been signed electronically. Number of Addenda: 0

## 2019-02-10 ENCOUNTER — Encounter: Payer: Self-pay | Admitting: Internal Medicine

## 2019-02-10 ENCOUNTER — Other Ambulatory Visit: Payer: Self-pay

## 2019-02-10 ENCOUNTER — Ambulatory Visit: Payer: Self-pay | Admitting: Internal Medicine

## 2019-02-10 ENCOUNTER — Ambulatory Visit: Payer: Self-pay | Attending: Internal Medicine | Admitting: Internal Medicine

## 2019-02-10 ENCOUNTER — Telehealth: Payer: Self-pay | Admitting: Internal Medicine

## 2019-02-10 VITALS — BP 160/89 | HR 61 | Temp 97.9°F | Resp 16 | Wt 238.2 lb

## 2019-02-10 DIAGNOSIS — I10 Essential (primary) hypertension: Secondary | ICD-10-CM

## 2019-02-10 DIAGNOSIS — T39395A Adverse effect of other nonsteroidal anti-inflammatory drugs [NSAID], initial encounter: Secondary | ICD-10-CM

## 2019-02-10 DIAGNOSIS — H9312 Tinnitus, left ear: Secondary | ICD-10-CM

## 2019-02-10 DIAGNOSIS — E119 Type 2 diabetes mellitus without complications: Secondary | ICD-10-CM

## 2019-02-10 DIAGNOSIS — H9192 Unspecified hearing loss, left ear: Secondary | ICD-10-CM

## 2019-02-10 DIAGNOSIS — D509 Iron deficiency anemia, unspecified: Secondary | ICD-10-CM

## 2019-02-10 DIAGNOSIS — K2960 Other gastritis without bleeding: Secondary | ICD-10-CM

## 2019-02-10 MED ORDER — GLIPIZIDE 5 MG PO TABS: ORAL_TABLET | ORAL | 6 refills | Status: DC

## 2019-02-10 MED ORDER — GLUCOSE BLOOD VI STRP: ORAL_STRIP | 12 refills | Status: DC

## 2019-02-10 MED ORDER — TRUEPLUS LANCETS 28G MISC: 12 refills | Status: DC

## 2019-02-10 MED FILL — TRUEplus LANCETS 28G MISC: 30 days supply | Qty: 100 | Fill #0

## 2019-02-10 MED FILL — TRUE METRIX TEST STRIP: 25 days supply | Qty: 100 | Fill #0

## 2019-02-10 NOTE — Patient Instructions (Signed)
Due to the gastritis, I recommend stopping Naprosyn.  It is okay for you to continue using Tylenol thousand milligrams twice a day as needed.  Use the Voltaren gel.  It can be used up to 3 times a day as needed on your lower back.  I have referred you to an ear nose and throat specialist.

## 2019-02-10 NOTE — Progress Notes (Signed)
Patient ID: Jeremy [name], male    DOB: 1960/11/17  MRN: [id number]  CC:  LT ear pain  Subjective:  Jeremy [name] is a 58 y.o. male who presents for UC visit. His concerns today include:  Pt with hx of DM, HL, HTN,GERD, IDA,chronic LBP, ED.  Patient was seen by nurse practitioner last week for left ear pain.  Patient reportedly became upset and left after the nurse practitioner told him that she would look in his ear but that he would have to return to his car outside for her to do a telephone encounter with him.  This was requested because patient had reported symptoms of cough, fever and diarrhea.  We are currently in the [place] pandemic and are trying to limit exposure of ourselves and other patients to any potential COVID case.  Patient states that this was not explained to him in that manner and this is why he became upset.  I did apologize to him today for that experience.  Patient also apologize for the way that he acted on his visit last week.    Pt c/o waking up every night with a "tone" in LT ear.  Like a hum. Duration: 1 mth Dec hearing:  Yes.  Constant.  He denies constant or repeated exposure to loud noise.  .   Pain:  Constant at about a 4 and intermittent at about a 7.  Placed Peroxide in it without improvement.  DM: On last visit we had decreased the dose of metformin due to diarrhea and increase the morning dose of Glucotrol to 10 mg and kept the evening dose at 5 mg.  However patient states that he had to decrease the Glucotrol back to 5 mg twice a day because he was having some hypoglycemic episodes.  He has been out of strips for the past 3 weeks and requests refills.  Prior to running out his blood sugars in the mornings were between 103-112.    GI: Seen by GI for dysphasia and iron deficiency anemia.  Had EGD and colonoscopy.  EGD revealed changes consistent with Barrett's esophagus and moderate gastritis thought to be NSAID induced.  Gastroenterologist increase Protonix to  40 mg twice a day.  He has cut back on Naprosyn to one 220 mg tab BID and Tyl to 500 mg BID.  He takes these for chronic back pain.  He has Voltaren gel at home which he uses once a day on his lower back. States he was told he has OSA when he went in to have his colonoscopy.  This is on his problem list but patient states he was never diagnosed with obstructive sleep apnea and does not have CPAP.    HTN: Blood pressure noted to be elevated today.  Patient states he has not taken his medicine as yet for today.  He plans to take when he returns home Patient Active Problem List   Diagnosis Date Noted  . Iron deficiency anemia 11/25/2018  . Phimosis of penis 11/19/2018  . Esophageal dysphagia 08/19/2018  . Benign prostatic hyperplasia with post-void dribbling 01/31/2018  . Absence of bladder continence 01/31/2018  . Obesity (BMI 30-39.9) 11/05/2017  . GERD (gastroesophageal reflux disease) 06/03/2017  . Chronic pain of both knees 06/03/2017  . Chronic lower back pain 06/03/2017  . Type 2 diabetes mellitus without complication, without long-term current use of insulin ([place]) 04/12/2016  . Essential hypertension 04/12/2016     Current Outpatient Medications on File Prior to Visit  Medication Sig Dispense Refill  . acetaminophen (TYLENOL) 500 MG tablet Take 1,000 mg by mouth 2 (two) times daily.     Marland Kitchen alum & mag hydroxide-simeth (MAALOX/MYLANTA) 200-200-20 MG/5ML suspension Take 15 mLs by mouth every 6 (six) hours as needed for indigestion or heartburn.    Marland Kitchen atorvastatin (LIPITOR) 40 MG tablet Take 1 tablet (40 mg total) by mouth daily. 90 tablet 3  . Blood Glucose Monitoring Suppl (TRUE METRIX METER) w/Device KIT 1 each by Does not apply route 4 (four) times daily -  before meals and at bedtime. 1 kit 0  . diphenhydrAMINE (BENADRYL) 25 MG tablet Take 50 mg by mouth every 6 (six) hours as needed for itching or allergies (bee stings).     . famotidine (PEPCID) 20 MG tablet Take 20 mg by mouth at  bedtime.    . ferrous sulfate 325 (65 FE) MG tablet Take 1 tablet (325 mg total) by mouth daily with breakfast. 90 tablet 0  . glipiZIDE (GLUCOTROL) 5 MG tablet Take 2 tablets PO in the mornings and 1 tablet in the evenings. (Patient taking differently: Take 5-10 mg by mouth See admin instructions. Take 10 mg in the morning and 5 mg at night) 90 tablet 6  . glucose blood (TRUE METRIX BLOOD GLUCOSE TEST) test strip Use as instructed 100 each 12  . Liniments (BLUE-EMU SUPER STRENGTH EX) Apply 1 application topically daily as needed (pain).    Marland Kitchen lisinopril (PRINIVIL,ZESTRIL) 20 MG tablet Take 1 tablet (20 mg total) by mouth daily. Stop Lisinopril/HCTZ 90 tablet 3  . loperamide (IMODIUM A-D) 2 MG tablet Take 2-4 mg by mouth as needed for diarrhea or loose stools.    Marland Kitchen loratadine (CLARITIN) 10 MG tablet Take 10 mg by mouth daily.    . metFORMIN (GLUCOPHAGE) 1000 MG tablet Take 0.5 tablets (500 mg total) by mouth 2 (two) times daily with a meal. 90 tablet 3  . naproxen sodium (ALEVE) 220 MG tablet Take 440 mg by mouth 2 (two) times daily.    Marland Kitchen OVER THE COUNTER MEDICATION Take 2 tablets by mouth at bedtime. "RESTLESS LEGS PM OTC"    . pantoprazole (PROTONIX) 40 MG tablet Take 1 tablet (40 mg total) by mouth 2 (two) times daily before a meal. 180 tablet 3  . Potassium 99 MG TABS Take 99 mg by mouth daily.    . tamsulosin (FLOMAX) 0.4 MG CAPS capsule Take 1 capsule (0.4 mg total) by mouth daily. (Patient taking differently: Take 0.4 mg by mouth every evening. ) 90 capsule 3   No current facility-administered medications on file prior to visit.     Allergies  Allergen Reactions  . Cymbalta [Duloxetine Hcl] Other (See Comments)    Caused anorgasmia  . Other Nausea And Vomiting    Mayonnaise      ROS: Review of Systems Negative except as above.  PHYSICAL EXAM: BP (!) 160/89   Pulse 61   Temp 97.9 F (36.6 C) (Oral)   Resp 16   Wt 238 lb 3.2 oz (108 kg)   SpO2 98%   BMI 34.18 kg/m    Physical Exam   General appearance - alert, well appearing, and in no distress Mental status - normal mood, behavior, speech, dress, motor activity, and thought processes Ears - bilateral TM's and external ear canals normal Nose - normal and patent, no erythema, discharge or polyps Mouth - mucous membranes moist, pharynx normal without lesions Neck - supple, no significant adenopathy Chest - clear  to auscultation, no wheezes, rales or rhonchi, symmetric air entry Heart - normal rate, regular rhythm, normal S1, S2, no murmurs, rubs, clicks or gallops  Results for orders placed or performed during the hospital encounter of 02/07/19  Glucose, capillary  Result Value Ref Range   Glucose-Capillary 184 (H) 70 - 99 mg/dL   ASSESSMENT AND PLAN: 1. Tinnitus of left ear Ear does not appear infected at this time. - Ambulatory referral to ENT  2. Decreased hearing of left ear See #1 above  3. Gastritis due to nonsteroidal anti-inflammatory drug (NSAID) Advised to stop Naprosyn. Advised to use Voltaren gel up to 3 times a day as needed.  Okay to use Tylenol Dose of Protonix increase per gastroenterology  4. Iron deficiency anemia, unspecified iron deficiency anemia type On iron supplement  5. Type 2 diabetes mellitus without complication, without long-term current use of insulin ([place]) Patient told it is okay to cut back the dose of Glucotrol to 5 mg twice a day.  I have refilled his test strips so that he can check his blood sugars daily - glucose blood (TRUE METRIX BLOOD GLUCOSE TEST) test strip; Use as instructed  Dispense: 100 each; Refill: 12 - glipiZIDE (GLUCOTROL) 5 MG tablet; Take 1 tablets PO in the mornings and 1 tablet in the evenings.  Dispense: 60 tablet; Refill: 6  6. Essential hypertension Patient to take lisinopril when he returns home.  I have updated his record and took obstructive sleep apnea off his past medical history list. Patient was given the opportunity to ask  questions.  Patient verbalized understanding of the plan and was able to repeat key elements of the plan.   No orders of the defined types were placed in this encounter.    Requested Prescriptions    No prescriptions requested or ordered in this encounter    Future Appointments  Date Time Provider [place]  02/18/2019  9:10 AM [name], MD [id number] None    Karle Plumber, MD, FACP

## 2019-02-11 ENCOUNTER — Encounter: Payer: Self-pay | Admitting: Gastroenterology

## 2019-02-11 NOTE — Progress Notes (Signed)
Will schedule procedure when opening available on [facility]'s schedule.

## 2019-02-11 NOTE — Progress Notes (Signed)
ON RECALL AND APPT MADE ° °

## 2019-02-13 ENCOUNTER — Encounter (HOSPITAL_COMMUNITY): Payer: Self-pay | Admitting: Gastroenterology

## 2019-02-18 ENCOUNTER — Ambulatory Visit: Payer: Self-pay | Admitting: Internal Medicine

## 2019-02-18 ENCOUNTER — Other Ambulatory Visit: Payer: Self-pay

## 2019-02-18 DIAGNOSIS — D509 Iron deficiency anemia, unspecified: Secondary | ICD-10-CM

## 2019-02-18 NOTE — Progress Notes (Signed)
Called pt, EGD/[facility] placement scheduled for 05/01/19 at 1:15pm (1st available, per [facility] he has been placed on cancellation list). Pt aware he will be contacted if we have cancellation to move procedure up. Orders entered. Instructions mailed.  Pt states he stopped Iron. Also states he does not use a CPAP.

## 2019-02-24 ENCOUNTER — Other Ambulatory Visit: Payer: Self-pay | Admitting: Internal Medicine

## 2019-02-24 ENCOUNTER — Encounter: Payer: Self-pay | Admitting: Internal Medicine

## 2019-02-24 DIAGNOSIS — I10 Essential (primary) hypertension: Secondary | ICD-10-CM

## 2019-02-24 MED FILL — LISINOPRIL 20 MG TAB: 20 | 30 days supply | Qty: 30 | Fill #0

## 2019-02-24 MED FILL — ?METFORMIN HCL 1000 MG TAB: 1000 | 30 days supply | Qty: 30 | Fill #1

## 2019-03-03 MED FILL — TAMSULOSIN HCL 0.4 MG CAP: 0.4 | 30 days supply | Qty: 30 | Fill #1

## 2019-03-04 ENCOUNTER — Telehealth: Payer: Self-pay | Admitting: Gastroenterology

## 2019-03-04 ENCOUNTER — Other Ambulatory Visit: Payer: Self-pay

## 2019-03-04 DIAGNOSIS — D509 Iron deficiency anemia, unspecified: Secondary | ICD-10-CM

## 2019-03-04 NOTE — Telephone Encounter (Signed)
PLEASE CALL PT. HE NEEDS A CBC WITHIN 7 DAYS AND REPEAT IN ONE MONTH BECAUSE HIS EGD HAS BEEN DELAYED UNTIL JUL 2020 DUE TO COVID 19 RESTRICTIONS.

## 2019-03-04 NOTE — Addendum Note (Signed)
Addended by: Corrie Mckusick on: 03/04/2019 12:58 PM   Modules accepted: Orders

## 2019-03-04 NOTE — Telephone Encounter (Signed)
Called and informed pt, states he may go to [facility] and they use [facility]. Orders entered for CBC and mailed to pt.

## 2019-03-13 ENCOUNTER — Ambulatory Visit: Payer: Self-pay | Attending: Family Medicine

## 2019-03-13 ENCOUNTER — Other Ambulatory Visit: Payer: Self-pay

## 2019-03-14 LAB — CBC WITH DIFFERENTIAL/PLATELET
Basophils Absolute: 0.1 10*3/uL (ref 0.0–0.2)
Basos: 1 %
EOS (ABSOLUTE): 0.2 10*3/uL (ref 0.0–0.4)
Eos: 4 %
Hematocrit: 40.3 % (ref 37.5–51.0)
Hemoglobin: 13.4 g/dL (ref 13.0–17.7)
Immature Grans (Abs): 0 10*3/uL (ref 0.0–0.1)
Immature Granulocytes: 1 %
Lymphocytes Absolute: 1.9 10*3/uL (ref 0.7–3.1)
Lymphs: 37 %
MCH: 29.8 pg (ref 26.6–33.0)
MCHC: 33.3 g/dL (ref 31.5–35.7)
MCV: 90 fL (ref 79–97)
Monocytes Absolute: 0.5 10*3/uL (ref 0.1–0.9)
Monocytes: 10 %
Neutrophils Absolute: 2.4 10*3/uL (ref 1.4–7.0)
Neutrophils: 47 %
Platelets: 192 10*3/uL (ref 150–450)
RBC: 4.49 x10E6/uL (ref 4.14–5.80)
RDW: 13.4 % (ref 11.6–15.4)
WBC: 5.2 10*3/uL (ref 3.4–10.8)

## 2019-03-17 ENCOUNTER — Telehealth: Payer: Self-pay | Admitting: Gastroenterology

## 2019-03-17 MED FILL — ?GLIPIZIDE 5MG TABLET: 5 | 30 days supply | Qty: 90 | Fill #2

## 2019-03-17 NOTE — Progress Notes (Signed)
PT is aware.

## 2019-03-17 NOTE — Telephone Encounter (Signed)
[phone] PLEASE CALL PATIENT, HE HAS A QUESTION ABOUT GETTING HIS LABS DONE

## 2019-03-17 NOTE — Telephone Encounter (Signed)
Pt wanted to know if he is still supposed to repeat lab in one month. I told him yes ( see Dr. Darrick Penna note of 03/04/2019).

## 2019-03-27 NOTE — Telephone Encounter (Signed)
Had cancellations for procedures next week. Called pt and offered to move up EGD/[facility]. He was going to move up to 04/04/19 but then declined after being told he would need COVID test and be quarantined after test until procedure. Pt wants to keep procedure 05/01/19 as scheduled. FYI to [facility].

## 2019-03-31 MED FILL — metFORMIN HCL 1000 MG TABS: 1000 | 30 days supply | Qty: 30 | Fill #2

## 2019-03-31 MED FILL — PANTOPRAZOLE SOD DR 40 MG T: 40 | 30 days supply | Qty: 60 | Fill #1

## 2019-03-31 MED FILL — LISINOPRIL 20 MG TABLET: 20 | 30 days supply | Qty: 30 | Fill #1

## 2019-03-31 MED FILL — TAMSULOSIN HCL 0.4 MG CAP: 0.4 | 30 days supply | Qty: 30 | Fill #2

## 2019-04-01 NOTE — Telephone Encounter (Signed)
REVIEWED-NO ADDITIONAL RECOMMENDATIONS. 

## 2019-04-02 ENCOUNTER — Encounter: Payer: Self-pay | Admitting: Internal Medicine

## 2019-04-02 DIAGNOSIS — R0683 Snoring: Secondary | ICD-10-CM

## 2019-04-03 ENCOUNTER — Encounter: Payer: Self-pay | Admitting: Internal Medicine

## 2019-04-03 NOTE — Telephone Encounter (Signed)
Dr Wynetta Emery  I will sent referral to Dr Benjamine Mola at [place] location  Is affiliated with [facility]

## 2019-04-16 ENCOUNTER — Telehealth: Payer: Self-pay | Admitting: *Deleted

## 2019-04-16 ENCOUNTER — Encounter: Payer: Self-pay | Admitting: Gastroenterology

## 2019-04-16 NOTE — Telephone Encounter (Signed)
Called patient to make aware it is a requirement that he will have to have COVID-19 testing prior to procedure scheduled for 7/16. He stated "just cancel everything then I am not doing that". Called endo and LMOVM to cancel procedure. FYI to [facility] and LSL

## 2019-04-16 NOTE — Telephone Encounter (Signed)
PATIENT SCHEDULED  °

## 2019-04-16 NOTE — Telephone Encounter (Signed)
REVIEWED. Pt needs opv in 4 mos to discuss benefits v. Risks of EGD FOR IRON DEFICIENCY ANEMIA.

## 2019-04-22 ENCOUNTER — Ambulatory Visit: Payer: Self-pay | Admitting: Internal Medicine

## 2019-04-22 ENCOUNTER — Telehealth: Payer: Self-pay | Admitting: Gastroenterology

## 2019-04-22 NOTE — Telephone Encounter (Signed)
Pt had recently cancelled his covid test and procedure. SF wanted him to follow up in 4 months and is scheduled to come to the office in [place]. Pt said he is having a covid test this Saturday prior to his sleep study on Sunday and wanted to know if he could go ahead and reschedule his procedure and use the covid test for two different things instead of having the covid test done twice. Please advise. [phone]

## 2019-04-22 NOTE — Telephone Encounter (Signed)
Called patient. Made aware his procedure/slot has already been taken on schedule after he cancelled. Currently nothing sooner and per endo COVID-19 testing is only good prior to procedure being done. He would need to be retested if not. Patient aware [facility] currently booking in October and we do not have that schedule yet. He is scheduled for see LSL and states he will wait until that appt to r/s.

## 2019-04-25 NOTE — Telephone Encounter (Signed)
REVIEWED-NO ADDITIONAL RECOMMENDATIONS. 

## 2019-04-26 ENCOUNTER — Other Ambulatory Visit (HOSPITAL_COMMUNITY)
Admission: RE | Admit: 2019-04-26 | Discharge: 2019-04-26 | Disposition: A | Discharge status: Home / Self Care | Payer: HRSA Program | Source: Ambulatory Visit | Attending: Internal Medicine | Admitting: Internal Medicine

## 2019-04-26 DIAGNOSIS — Z1159 Encounter for screening for other viral diseases: Secondary | ICD-10-CM | POA: Insufficient documentation

## 2019-04-26 DIAGNOSIS — Z01812 Encounter for preprocedural laboratory examination: Secondary | ICD-10-CM | POA: Insufficient documentation

## 2019-04-26 LAB — SARS CORONAVIRUS 2 (TAT 6-24 HRS): SARS Coronavirus 2: NEGATIVE

## 2019-04-27 ENCOUNTER — Ambulatory Visit (HOSPITAL_BASED_OUTPATIENT_CLINIC_OR_DEPARTMENT_OTHER): Payer: Self-pay | Attending: Internal Medicine | Admitting: Internal Medicine

## 2019-04-27 ENCOUNTER — Other Ambulatory Visit: Payer: Self-pay

## 2019-04-27 DIAGNOSIS — E669 Obesity, unspecified: Secondary | ICD-10-CM | POA: Insufficient documentation

## 2019-04-27 DIAGNOSIS — G4700 Insomnia, unspecified: Secondary | ICD-10-CM | POA: Insufficient documentation

## 2019-04-27 DIAGNOSIS — R0683 Snoring: Secondary | ICD-10-CM

## 2019-04-27 DIAGNOSIS — G4733 Obstructive sleep apnea (adult) (pediatric): Secondary | ICD-10-CM | POA: Insufficient documentation

## 2019-04-27 DIAGNOSIS — Z6833 Body mass index (BMI) 33.0-33.9, adult: Secondary | ICD-10-CM | POA: Insufficient documentation

## 2019-04-28 ENCOUNTER — Other Ambulatory Visit: Payer: Self-pay

## 2019-04-28 ENCOUNTER — Encounter: Payer: Self-pay | Admitting: Internal Medicine

## 2019-04-29 ENCOUNTER — Encounter: Payer: Self-pay | Admitting: Internal Medicine

## 2019-04-29 DIAGNOSIS — R0683 Snoring: Secondary | ICD-10-CM

## 2019-04-29 NOTE — Procedures (Signed)
   Patient Name: Jeremy [name], Jeremy [name] Date: 04/27/2019 Gender: Male D.O.B: [birth year] Age (years): 58 Referring Provider: Karle Plumber MD Height (inches): 40 Interpreting Physician: Baird Lyons MD, ABSM Weight (lbs): 228 RPSGT: Baxter Flattery BMI: 33 MRN: [id number] Neck Size: 18.00  CLINICAL INFORMATION Sleep Study Type: NPSG Indication for sleep study: Insomnia, Obesity, Snoring, Witnesses Apnea / Gasping During Sleep Epworth Sleepiness Score: 9  SLEEP STUDY TECHNIQUE As per the AASM Manual for the Scoring of Sleep and Associated Events v2.3 (April 2016) with a hypopnea requiring 4% desaturations.  The channels recorded and monitored were frontal, central and occipital EEG, electrooculogram (EOG), submentalis EMG (chin), nasal and oral airflow, thoracic and abdominal wall motion, anterior tibialis EMG, snore microphone, electrocardiogram, and pulse oximetry.  MEDICATIONS Medications self-administered by patient taken the night of the study : none reported  SLEEP ARCHITECTURE The study was initiated at 11:14:38 PM and ended at 5:23:43 AM.  Sleep onset time was 16.4 minutes and the sleep efficiency was 87.9%%. The total sleep time was 324.5 minutes.  Stage REM latency was 63.0 minutes.  The patient spent 2.2%% of the night in stage N1 sleep, 78.6%% in stage N2 sleep, 0.0%% in stage N3 and 19.3% in REM.  Alpha intrusion was absent.  Supine sleep was 2.16%.  RESPIRATORY PARAMETERS The overall apnea/hypopnea index (AHI) was 11.6 per hour. There were 6 total apneas, including 6 obstructive, 0 central and 0 mixed apneas. There were 57 hypopneas and 0 RERAs.  The AHI during Stage REM sleep was 25.9 per hour.  AHI while supine was 85.7 per hour.  The mean oxygen saturation was 93.0%. The minimum SpO2 during sleep was 86.0%.  loud snoring was noted during this study.  CARDIAC DATA The 2 lead EKG demonstrated sinus rhythm. The mean heart rate was 63.3 beats per minute.  Other EKG findings include: None.  LEG MOVEMENT DATA The total PLMS were 0 with a resulting PLMS index of 0.0. Associated arousal with leg movement index was 0.2 .  IMPRESSIONS - Mild obstructive sleep apnea occurred during this study (AHI = 11.6/h). - No significant central sleep apnea occurred during this study (CAI = 0.0/h). - Mild oxygen desaturation was noted during this study (Min O2 = 86.0%). Mean sat 93%. - The patient snored with loud snoring volume. - No cardiac abnormalities were noted during this study. - Clinically significant periodic limb movements did not occur during sleep. No significant associated arousals.  DIAGNOSIS - Obstructive Sleep Apnea (327.23 [G47.33 ICD-10])  RECOMMENDATIONS - Suggest CPAP autopap or CPAP titration sleep study. Other options would be based on clinical judgment. - Be careful with alcohol, sedatives and other CNS depressants that may worsen sleep apnea and disrupt normal sleep architecture. - Sleep hygiene should be reviewed to assess factors that may improve sleep quality. - Weight management and regular exercise should be initiated or continued if appropriate.  [Electronically signed] 04/29/2019 03:08 PM  Baird Lyons MD, [place], American Board of Sleep Medicine   NPI: [id number]                          [place], [place] of Sleep Medicine  ELECTRONICALLY SIGNED ON:  04/29/2019, 3:06 PM [place] PH: [phone]   FX: [phone] [place]

## 2019-04-30 ENCOUNTER — Other Ambulatory Visit: Payer: Self-pay | Admitting: Internal Medicine

## 2019-04-30 DIAGNOSIS — G4733 Obstructive sleep apnea (adult) (pediatric): Secondary | ICD-10-CM | POA: Insufficient documentation

## 2019-04-30 MED ORDER — MICONAZOLE NITRATE 2 % EX CREA: 1.0000 "application " | TOPICAL_CREAM | Freq: Two times a day (BID) | CUTANEOUS | 0 refills | Status: DC

## 2019-05-01 ENCOUNTER — Encounter (HOSPITAL_COMMUNITY): Payer: Self-pay

## 2019-05-01 ENCOUNTER — Ambulatory Visit (HOSPITAL_COMMUNITY): Admit: 2019-05-01 | Payer: Self-pay | Admitting: Gastroenterology

## 2019-05-01 SURGERY — EGD (ESOPHAGOGASTRODUODENOSCOPY)
Anesthesia: Moderate Sedation

## 2019-05-05 ENCOUNTER — Other Ambulatory Visit: Payer: Self-pay

## 2019-05-05 ENCOUNTER — Ambulatory Visit (INDEPENDENT_AMBULATORY_CARE_PROVIDER_SITE_OTHER): Payer: Self-pay | Admitting: Otolaryngology

## 2019-05-05 DIAGNOSIS — H903 Sensorineural hearing loss, bilateral: Secondary | ICD-10-CM

## 2019-05-05 DIAGNOSIS — H9312 Tinnitus, left ear: Secondary | ICD-10-CM

## 2019-05-08 MED FILL — metFORMIN HCL 1000 MG TABS: 1000 | 30 days supply | Qty: 30 | Fill #3

## 2019-05-08 MED FILL — TAMSULOSIN HCL 0.4 MG CAP: 0.4 | 30 days supply | Qty: 30 | Fill #3

## 2019-05-08 MED FILL — LISINOPRIL 20 MG TABLET: 20 | 30 days supply | Qty: 30 | Fill #2

## 2019-05-15 MED FILL — ?GLIPIZIDE 5MG TABLET: 5 | 30 days supply | Qty: 90 | Fill #3

## 2019-05-19 ENCOUNTER — Encounter: Payer: Self-pay | Admitting: Internal Medicine

## 2019-05-19 ENCOUNTER — Other Ambulatory Visit: Payer: Self-pay

## 2019-05-19 ENCOUNTER — Ambulatory Visit: Payer: Self-pay | Attending: Internal Medicine | Admitting: Internal Medicine

## 2019-05-19 DIAGNOSIS — E119 Type 2 diabetes mellitus without complications: Secondary | ICD-10-CM

## 2019-05-19 DIAGNOSIS — K2270 Barrett's esophagus without dysplasia: Secondary | ICD-10-CM | POA: Insufficient documentation

## 2019-05-19 DIAGNOSIS — I10 Essential (primary) hypertension: Secondary | ICD-10-CM

## 2019-05-19 DIAGNOSIS — G4733 Obstructive sleep apnea (adult) (pediatric): Secondary | ICD-10-CM

## 2019-05-19 DIAGNOSIS — K219 Gastro-esophageal reflux disease without esophagitis: Secondary | ICD-10-CM

## 2019-05-19 DIAGNOSIS — M79671 Pain in right foot: Secondary | ICD-10-CM

## 2019-05-19 NOTE — Progress Notes (Signed)
Virtual Visit via Telephone Note Due to current restrictions/limitations of in-office visits due to the COVID-19 pandemic, this scheduled clinical appointment was converted to a telehealth visit  I connected with Jeremy [name] on 05/19/19 at 2:36 p.m by telephone and verified that I am speaking with the correct person using two identifiers. I am in my office.  The patient is at home.  Only the patient and myself participated in this encounter.  I discussed the limitations, risks, security and privacy concerns of performing an evaluation and management service by telephone and the availability of in person appointments. I also discussed with the patient that there may be a patient responsible charge related to this service. The patient expressed understanding and agreed to proceed.   History of Present Illness: Pt with hx of DM, HL, HTN,GERD, IDA (Barrett's esophagus and moderate gastritis thought to be NSAID induced on EGD) ,chronic LBP, ED, OSA.  OSA: had pos sleep study. Has 2nd part of sleep study next wk  DM:  Taking a supplement nutrition shake called Pleaix which reportedly helps lower BS and helps decrease heart burn.   He is not sure of the ingredients of it.  He checks blood sugars daily.  Gives range of A.m BS 102-140 Doing well with eating habits.  Admits his weakness though is sweet tea.  B/w he and fiancee they drink about 1 gallon sweet tea a day Over due for eye exam.  Not cover by [facility]  HTN:  Checks BP but not lately Compliant with Lisinopril and salt restriction  Barrett's/Gastritis: wean self down to Protonix 40 mg every 3rd day from BID since using this supplement nutrition shake which was mentioned earlier.  Has f/u endoscopy 07/2019  Taking a supplement called Eve that helps to reduce inflammation.  Not sure what is in them  Saw ENT for tinnitis.  Told it was likely a virus.  Reports being told that because it is not getting with them right away it was too late and  he will have the ringing in the ear for the rest of his life.    Wants Podiatry referral for bone spur RT heel.  Pain in heel x 2 wks.  Hurts to step on heel.   Saw ortho in past for same and had inj which helped  Current Outpatient Medications on File Prior to Visit  Medication Sig Dispense Refill  . acetaminophen (TYLENOL) 500 MG tablet Take 1,000 mg by mouth 2 (two) times daily.     Marland Kitchen alum & mag hydroxide-simeth (MAALOX/MYLANTA) 200-200-20 MG/5ML suspension Take 15 mLs by mouth every 6 (six) hours as needed for indigestion or heartburn.    Marland Kitchen atorvastatin (LIPITOR) 40 MG tablet Take 1 tablet (40 mg total) by mouth daily. 90 tablet 3  . Blood Glucose Monitoring Suppl (TRUE METRIX METER) w/Device KIT 1 each by Does not apply route 4 (four) times daily -  before meals and at bedtime. 1 kit 0  . diphenhydrAMINE (BENADRYL) 25 MG tablet Take 50 mg by mouth every 6 (six) hours as needed for itching or allergies (bee stings).     . ferrous sulfate 325 (65 FE) MG tablet Take 1 tablet (325 mg total) by mouth daily with breakfast. 90 tablet 0  . glipiZIDE (GLUCOTROL) 5 MG tablet Take 1 tablets PO in the mornings and 1 tablet in the evenings. 60 tablet 6  . glucose blood (TRUE METRIX BLOOD GLUCOSE TEST) test strip Use as instructed 100 each 12  . Liniments (  BLUE-EMU SUPER STRENGTH EX) Apply 1 application topically daily as needed (pain).    Marland Kitchen lisinopril (ZESTRIL) 20 MG tablet TAKE 1 TABLET BY MOUTH DAILY. STOP LISINOPRIL/HCTZ 90 tablet 0  . loperamide (IMODIUM A-D) 2 MG tablet Take 2-4 mg by mouth as needed for diarrhea or loose stools.    Marland Kitchen loratadine (CLARITIN) 10 MG tablet Take 10 mg by mouth daily.    . metFORMIN (GLUCOPHAGE) 1000 MG tablet Take 0.5 tablets (500 mg total) by mouth 2 (two) times daily with a meal. 90 tablet 3  . miconazole (MICOTIN) 2 % cream Apply 1 application topically 2 (two) times daily. 28.35 g 0  . OVER THE COUNTER MEDICATION Take 2 tablets by mouth at bedtime. "RESTLESS LEGS PM  OTC"    . pantoprazole (PROTONIX) 40 MG tablet Take 1 tablet (40 mg total) by mouth 2 (two) times daily before a meal. 180 tablet 3  . Potassium 99 MG TABS Take 99 mg by mouth daily.    . tamsulosin (FLOMAX) 0.4 MG CAPS capsule Take 1 capsule (0.4 mg total) by mouth daily. (Patient taking differently: Take 0.4 mg by mouth every evening. ) 90 capsule 3  . TRUEplus Lancets 28G MISC Check blood sugar fasting and before meals and again if pt feels bad (symptoms of hypo). 100 each 12   No current facility-administered medications on file prior to visit.     Observations/Objective: Results for orders placed or performed during the hospital encounter of 04/26/19  SARS Coronavirus 2 (Performed in [place] hospital lab)   Specimen: Nasal Swab  Result Value Ref Range   SARS Coronavirus 2 NEGATIVE NEGATIVE     Chemistry      Component Value Date/Time   NA 137 11/19/2018 1040   K 4.1 11/19/2018 1040   CL 101 11/19/2018 1040   CO2 21 11/19/2018 1040   BUN 15 11/19/2018 1040   CREATININE 0.74 (L) 11/19/2018 1040   CREATININE 0.72 10/17/2016 1012      Component Value Date/Time   CALCIUM 9.7 11/19/2018 1040   ALKPHOS 55 11/19/2018 1040   AST 29 11/19/2018 1040   ALT 37 11/19/2018 1040   BILITOT <0.2 11/19/2018 1040     Lab Results  Component Value Date   WBC 5.2 03/13/2019   HGB 13.4 03/13/2019   HCT 40.3 03/13/2019   MCV 90 03/13/2019   PLT 192 03/13/2019  \  Assessment and Plan: 1. Type 2 diabetes mellitus without complication, without long-term current use of insulin ([place]) Reported blood sugars are good. Dietary counseling given.  Encouraged him to cut out sugary drinks including sweet tea. Continue metformin and glipizide.  2. Essential hypertension Continue low-salt diet.  Continue lisinopril.  Advised to check blood pressure at least once a week with goal being 130/80 or lower  3. Gastroesophageal reflux disease without esophagitis Recommend that he does not stop the  Protonix completely given his history of Barrett's esophagus.  Recommend decreasing to once a day  4. Barrett's esophagus without dysplasia See #3 above  5. Pain of right heel - Ambulatory referral to [place] Complete Right; Future  6. OSA (obstructive sleep apnea) CPAP titration study scheduled for next week   Follow Up Instructions: F/u in 3 mths   I discussed the assessment and treatment plan with the patient. The patient was provided an opportunity to ask questions and all were answered. The patient agreed with the plan and demonstrated an understanding of the instructions.   The  patient was advised to call back or seek an in-person evaluation if the symptoms worsen or if the condition fails to improve as anticipated.  I provided 15 minutes of non-face-to-face time during this encounter.   Karle Plumber, MD

## 2019-05-19 NOTE — Progress Notes (Signed)
Pt states he is having some stiffness in his back

## 2019-05-21 ENCOUNTER — Other Ambulatory Visit: Payer: Self-pay

## 2019-05-21 ENCOUNTER — Ambulatory Visit (HOSPITAL_COMMUNITY)
Admission: RE | Admit: 2019-05-21 | Discharge: 2019-05-21 | Disposition: A | Discharge status: Home / Self Care | Payer: Self-pay | Source: Ambulatory Visit | Attending: Internal Medicine | Admitting: Internal Medicine

## 2019-05-21 DIAGNOSIS — M79671 Pain in right foot: Secondary | ICD-10-CM | POA: Insufficient documentation

## 2019-05-23 ENCOUNTER — Other Ambulatory Visit (HOSPITAL_COMMUNITY)
Admission: RE | Admit: 2019-05-23 | Discharge: 2019-05-23 | Disposition: A | Discharge status: Home / Self Care | Payer: No Typology Code available for payment source | Source: Ambulatory Visit | Attending: Internal Medicine | Admitting: Internal Medicine

## 2019-05-23 DIAGNOSIS — Z20828 Contact with and (suspected) exposure to other viral communicable diseases: Secondary | ICD-10-CM | POA: Insufficient documentation

## 2019-05-23 DIAGNOSIS — Z01812 Encounter for preprocedural laboratory examination: Secondary | ICD-10-CM | POA: Insufficient documentation

## 2019-05-23 LAB — SARS CORONAVIRUS 2 (TAT 6-24 HRS): SARS Coronavirus 2: NEGATIVE

## 2019-05-25 ENCOUNTER — Ambulatory Visit (HOSPITAL_BASED_OUTPATIENT_CLINIC_OR_DEPARTMENT_OTHER): Payer: No Typology Code available for payment source | Attending: Internal Medicine | Admitting: Internal Medicine

## 2019-05-25 ENCOUNTER — Other Ambulatory Visit: Payer: Self-pay

## 2019-05-25 DIAGNOSIS — G4761 Periodic limb movement disorder: Secondary | ICD-10-CM | POA: Insufficient documentation

## 2019-05-25 DIAGNOSIS — G4733 Obstructive sleep apnea (adult) (pediatric): Secondary | ICD-10-CM | POA: Insufficient documentation

## 2019-05-29 ENCOUNTER — Ambulatory Visit: Payer: No Typology Code available for payment source | Admitting: Podiatry

## 2019-05-29 ENCOUNTER — Other Ambulatory Visit: Payer: Self-pay

## 2019-05-29 ENCOUNTER — Encounter: Payer: Self-pay | Admitting: Podiatry

## 2019-05-29 ENCOUNTER — Ambulatory Visit: Payer: Self-pay | Attending: Internal Medicine | Admitting: Physician Assistant

## 2019-05-29 VITALS — BP 149/75 | HR 71 | Temp 97.5°F

## 2019-05-29 DIAGNOSIS — E119 Type 2 diabetes mellitus without complications: Secondary | ICD-10-CM

## 2019-05-29 DIAGNOSIS — M79671 Pain in right foot: Secondary | ICD-10-CM

## 2019-05-29 DIAGNOSIS — M25552 Pain in left hip: Secondary | ICD-10-CM

## 2019-05-29 DIAGNOSIS — T148XXA Other injury of unspecified body region, initial encounter: Secondary | ICD-10-CM

## 2019-05-29 DIAGNOSIS — M722 Plantar fascial fibromatosis: Secondary | ICD-10-CM

## 2019-05-29 DIAGNOSIS — M7731 Calcaneal spur, right foot: Secondary | ICD-10-CM

## 2019-05-29 DIAGNOSIS — S3091XA Unspecified superficial injury of lower back and pelvis, initial encounter: Secondary | ICD-10-CM

## 2019-05-29 DIAGNOSIS — X500XXA Overexertion from strenuous movement or load, initial encounter: Secondary | ICD-10-CM

## 2019-05-29 MED ORDER — NAPROXEN 500 MG PO TABS: 500.0000 mg | ORAL_TABLET | Freq: Two times a day (BID) | ORAL | 0 refills | Status: DC

## 2019-05-29 MED ORDER — METHOCARBAMOL 500 MG PO TABS: 1000.0000 mg | ORAL_TABLET | Freq: Three times a day (TID) | ORAL | 0 refills | Status: DC | PRN

## 2019-05-29 MED FILL — NAPROXEN 500 MG TABLET: 500 | 30 days supply | Qty: 60 | Fill #0

## 2019-05-29 MED FILL — METHOCARBAMOL 500 MG TABS: 500 | 15 days supply | Qty: 90 | Fill #0

## 2019-05-29 NOTE — Progress Notes (Signed)
Subjective:  Patient ID: Jeremy [name], male    DOB: 1961-06-09,  MRN: [id number]  Chief Complaint  Patient presents with  . Foot Pain    R bottom of heel; "have history of heel spurs; have one on bottom of foot thats getting painfu; x83mo    58y.o. male presents with the above complaint. Hx as above  Review of Systems: Negative except as noted in the HPI. Denies N/V/F/Ch.  Past Medical History:  Diagnosis Date  . Diabetes mellitus without complication ([place]   . GERD (gastroesophageal reflux disease)   . Hyperlipidemia   . Hypertension   . Rash of entire body 03/2016    Current Outpatient Medications:  .  acetaminophen (TYLENOL) 500 MG tablet, Take 1,000 mg by mouth 2 (two) times daily. , Disp: , Rfl:  .  alum & mag hydroxide-simeth (MAALOX/MYLANTA) 200-200-20 MG/5ML suspension, Take 15 mLs by mouth every 6 (six) hours as needed for indigestion or heartburn., Disp: , Rfl:  .  atorvastatin (LIPITOR) 40 MG tablet, Take 1 tablet (40 mg total) by mouth daily., Disp: 90 tablet, Rfl: 3 .  Blood Glucose Monitoring Suppl (TRUE METRIX METER) w/Device KIT, 1 each by Does not apply route 4 (four) times daily -  before meals and at bedtime., Disp: 1 kit, Rfl: 0 .  diphenhydrAMINE (BENADRYL) 25 MG tablet, Take 50 mg by mouth every 6 (six) hours as needed for itching or allergies (bee stings). , Disp: , Rfl:  .  ferrous sulfate 325 (65 FE) MG tablet, Take 1 tablet (325 mg total) by mouth daily with breakfast., Disp: 90 tablet, Rfl: 0 .  glipiZIDE (GLUCOTROL) 5 MG tablet, Take 1 tablets PO in the mornings and 1 tablet in the evenings., Disp: 60 tablet, Rfl: 6 .  glucose blood (TRUE METRIX BLOOD GLUCOSE TEST) test strip, Use as instructed, Disp: 100 each, Rfl: 12 .  Liniments (BLUE-EMU SUPER STRENGTH EX), Apply 1 application topically daily as needed (pain)., Disp: , Rfl:  .  lisinopril (ZESTRIL) 20 MG tablet, TAKE 1 TABLET BY MOUTH DAILY. STOP LISINOPRIL/HCTZ, Disp: 90 tablet, Rfl: 0 .   loperamide (IMODIUM A-D) 2 MG tablet, Take 2-4 mg by mouth as needed for diarrhea or loose stools., Disp: , Rfl:  .  loratadine (CLARITIN) 10 MG tablet, Take 10 mg by mouth daily., Disp: , Rfl:  .  metFORMIN (GLUCOPHAGE) 1000 MG tablet, Take 0.5 tablets (500 mg total) by mouth 2 (two) times daily with a meal., Disp: 90 tablet, Rfl: 3 .  miconazole (MICOTIN) 2 % cream, Apply 1 application topically 2 (two) times daily., Disp: 28.35 g, Rfl: 0 .  OVER THE COUNTER MEDICATION, Take 2 tablets by mouth at bedtime. "RESTLESS LEGS PM OTC", Disp: , Rfl:  .  pantoprazole (PROTONIX) 40 MG tablet, Take 1 tablet (40 mg total) by mouth 2 (two) times daily before a meal., Disp: 180 tablet, Rfl: 3 .  Potassium 99 MG TABS, Take 99 mg by mouth daily., Disp: , Rfl:  .  tamsulosin (FLOMAX) 0.4 MG CAPS capsule, Take 1 capsule (0.4 mg total) by mouth daily. (Patient taking differently: Take 0.4 mg by mouth every evening. ), Disp: 90 capsule, Rfl: 3 .  TRUEplus Lancets 28G MISC, Check blood sugar fasting and before meals and again if pt feels bad (symptoms of hypo)., Disp: 100 each, Rfl: 12  Social History   Tobacco Use  Smoking Status Former Smoker  . Quit date: 10/16/1988  . Years since quitting: 30.6  Smokeless  Tobacco Never Used    Allergies  Allergen Reactions  . Cymbalta [Duloxetine Hcl] Other (See Comments)    Caused anorgasmia  . Other Nausea And Vomiting    Mayonnaise    Objective:   Vitals:   05/29/19 0927  BP: (!) 149/75  Pulse: 71  Temp: (!) 97.5 F (36.4 C)   There is no height or weight on file to calculate BMI. Constitutional Well developed. Well nourished.  Vascular Dorsalis pedis pulses palpable bilaterally. Posterior tibial pulses palpable bilaterally. Capillary refill normal to all digits.  No cyanosis or clubbing noted. Pedal hair growth normal.  Neurologic Normal speech. Oriented to person, place, and time. Epicritic sensation to light touch grossly present bilaterally.   Dermatologic Nails well groomed and normal in appearance. No open wounds. No skin lesions.  Orthopedic: Normal joint ROM without pain or crepitus bilaterally. No visible deformities. Tender to palpation at the calcaneal tuber right, central heel No pain with calcaneal squeeze right. Ankle ROM diminished range of motion right. Silfverskiold Test: positive right.   Radiographs: Prior XR reviewed Large plantar spur  Assessment:   1. Plantar fasciitis   2. Calcaneal spur of right foot   3. Pain of right heel    Plan:  Patient was evaluated and treated and all questions answered.  Plantar Fasciitis, right - XR reviewed as above.  - Educated on icing and stretching. Instructions given.  - Injection delivered to the plantar fascia as below. - Heel pads dispensed.  Procedure: Injection Tendon/Ligament Location: Right plantar fascia at the glabrous junction; medial approach. Skin Prep: alcohol Injectate: 1 cc 0.5% marcaine plain, 1 cc dexamethasone phosphate, 0.5 cc kenalog 10. Disposition: Patient tolerated procedure well. Injection site dressed with a band-aid.  Return in about 2 months (around 07/29/2019) for Plantar fasciitis .

## 2019-05-29 NOTE — Patient Instructions (Signed)

## 2019-05-29 NOTE — Progress Notes (Signed)
Patient states that her heard something pop in his buttocks.

## 2019-05-29 NOTE — Progress Notes (Signed)
Patient ID: Jeremy [name], male   DOB: 07-24-1961, 58 y.o.   MRN: [id number] Virtual Visit via Telephone Note  I connected with Jeremy [name] on 05/29/19 at 10:30 AM EDT by telephone and verified that I am speaking with the correct person using two identifiers.   I discussed the limitations, risks, security and privacy concerns of performing an evaluation and management service by telephone and the availability of in person appointments. I also discussed with the patient that there may be a patient responsible charge related to this service. The patient expressed understanding and agreed to proceed.  Patient location:  home My Location:  [place] office Persons on the call:  Me and the patient   History of Present Illness:  Patient c/o pain in L buttocks after lifting a toilet in a box 2 days ago.  Felt a pop.  Ambulates ok but painful aleve and tylenol helping some.  No numbness or weakness.  When he sits down, he feels a knot in the area of his L buttocks.  I had him perform flexion and extension of the LLE which he said he was able to perform.   I also asked him to flex and extend from the quadricep and hamstring/buttocks which he was also able to do but stated it was uncomfortable.  He was able to abduct and adduct.     blood sugars under 150.    Observations/Objective:  A&Ox3   Assessment and Plan: 1. Pulled muscle With possible partial tear.  He can cancel the referral appt if he is improving.  RICE therapy.  Muscle massage.    - naproxen (NAPROSYN) 500 MG tablet; Take 1 tablet (500 mg total) by mouth 2 (two) times daily with a meal.  Dispense: 60 tablet; Refill: 0 - methocarbamol (ROBAXIN) 500 MG tablet; Take 2 tablets (1,000 mg total) by mouth every 8 (eight) hours as needed for muscle spasms.  Dispense: 90 tablet; Refill: 0 - Ambulatory referral to Orthopedic Surgery  2. Type 2 diabetes mellitus without complication, without long-term current use of insulin ([place]) Work on diet.   Continue current regimen.   I have had a lengthy discussion and provided education about insulin resistance and the intake of too much sugar/refined carbohydrates.  I have advised the patient to work at a goal of eliminating sugary drinks, candy, desserts, sweets, refined sugars, processed foods, and white carbohydrates.  The patient expresses understanding.   Follow Up Instructions: 11/10 appt with PCP   I discussed the assessment and treatment plan with the patient. The patient was provided an opportunity to ask questions and all were answered. The patient agreed with the plan and demonstrated an understanding of the instructions.   The patient was advised to call back or seek an in-person evaluation if the symptoms worsen or if the condition fails to improve as anticipated.  I provided 14 minutes of non-face-to-face time during this encounter.   Freeman Caldron, PA-C

## 2019-05-31 DIAGNOSIS — G4733 Obstructive sleep apnea (adult) (pediatric): Secondary | ICD-10-CM

## 2019-05-31 NOTE — Procedures (Signed)
Patient Name: Jeremy [name], Jeremy [name] Date: 05/25/2019 Gender: Male D.O.B: 09/10/1961 Age (years): 49 Referring Provider: Karle Plumber MD Height (inches): 70 Interpreting Physician: Baird Lyons MD, ABSM Weight (lbs): 228 RPSGT: Gwenyth Allegra BMI: 33 MRN: [id number] Neck Size: 18.00  CLINICAL INFORMATION The patient is referred for a CPAP titration to treat sleep apnea.  Date of NPSG, Split Night or HST:  NPSG 04/27/2019  AHI 11.6/ hr, desaturation to 86%, body weight 228 lbs  SLEEP STUDY TECHNIQUE As per the AASM Manual for the Scoring of Sleep and Associated Events v2.3 (April 2016) with a hypopnea requiring 4% desaturations.  The channels recorded and monitored were frontal, central and occipital EEG, electrooculogram (EOG), submentalis EMG (chin), nasal and oral airflow, thoracic and abdominal wall motion, anterior tibialis EMG, snore microphone, electrocardiogram, and pulse oximetry. Continuous positive airway pressure (CPAP) was initiated at the beginning of the study and titrated to treat sleep-disordered breathing.  MEDICATIONS Medications self-administered by patient taken the night of the study : none reported  TECHNICIAN COMMENTS Comments added by technician: NONE Comments added by scorer: N/A  RESPIRATORY PARAMETERS Optimal PAP Pressure (cm): 19 AHI at Optimal Pressure (/hr): 0.0 Overall Minimal O2 (%): 90.0 Supine % at Optimal Pressure (%): 32 Minimal O2 at Optimal Pressure (%): 94.0   SLEEP ARCHITECTURE The study was initiated at 10:50:46 PM and ended at 5:00:10 AM.  Sleep onset time was 25.7 minutes and the sleep efficiency was 80.3%%. The total sleep time was 296.7 minutes.  The patient spent 3.5%% of the night in stage N1 sleep, 78.6%% in stage N2 sleep, 0.0%% in stage N3 and 17.9% in REM.Stage REM latency was 90.5 minutes  Wake after sleep onset was 47.0. Alpha intrusion was absent. Supine sleep was 37.75%.  CARDIAC DATA The 2 lead EKG demonstrated  sinus rhythm. The mean heart rate was 61.9 beats per minute. Other EKG findings include: None.  LEG MOVEMENT DATA  A total of 334 Limb Movements recorded. Limb Movements with arousal 13 (PLMA2.6/ hour).  IMPRESSIONS - The optimal PAP pressure was 19 cm of water. - Central sleep apnea was not noted during this titration (CAI = 0.0/h). - Significant oxygen desaturations were not observed during this titration (min O2 = 90.0%). - The patient snored with soft snoring volume during this titration study. - No cardiac abnormalities were observed during this study. - Complaint of sore shoulder noted. Limb movements caused some sleep disturbance.  DIAGNOSIS - Obstructive Sleep Apnea (327.23 [G47.33 ICD-10]) - Periodic Limb Movement Sleep Disturbance  RECOMMENDATIONS - Trial of CPAP therapy on 19 cm H2O or autopap 15-20. - Patient used a Large size Fisher&Paykel Full Face Mask Simplus mask and heated humidification. - Be careful with alcohol, sedatives and other CNS depressants that may worsen sleep apnea and disrupt normal sleep architecture. - Consider trial of Requip, Mirapex or Sinemet if limb movement sleep disorder is clinically significant. - Sleep hygiene should be reviewed to assess factors that may improve sleep quality. - Weight management and regular exercise should be initiated or continued.  [Electronically signed] 05/31/2019 11:28 AM  Baird Lyons MD, ABSM Diplomate, American Board of Sleep Medicine   NPI: [id number]                          [place]  ELECTRONICALLY SIGNED ON:  05/31/2019, 11:22 AM [place] PH: [phone]   FX: [phone] [place]  [place]

## 2019-06-02 ENCOUNTER — Telehealth: Payer: Self-pay

## 2019-06-02 ENCOUNTER — Encounter: Payer: Self-pay | Admitting: Family Medicine

## 2019-06-02 ENCOUNTER — Ambulatory Visit (INDEPENDENT_AMBULATORY_CARE_PROVIDER_SITE_OTHER): Payer: Self-pay | Admitting: Family Medicine

## 2019-06-02 DIAGNOSIS — M25552 Pain in left hip: Secondary | ICD-10-CM

## 2019-06-02 MED ORDER — CYCLOBENZAPRINE HCL 10 MG PO TABS: 10.0000 mg | ORAL_TABLET | Freq: Three times a day (TID) | ORAL | 3 refills | Status: DC | PRN

## 2019-06-02 MED FILL — CYCLOBENZAPRINE 10 MG TAB: 10 | 20 days supply | Qty: 60 | Fill #0

## 2019-06-02 NOTE — Telephone Encounter (Addendum)
Call placed to patient to discuss the need for CPAP. He said that he has already reviewed the results in [place].  He does not have insurance at this time and does not think that he is eligible for medicaid.  He is agreeable to sending the order to [facility] and speaking to them about their hardship program.    Referral faxed to [place]

## 2019-06-02 NOTE — Progress Notes (Signed)
Jeremy [name] - 58 y.o. male MRN [id number]  Date of birth: 12/25/1960  Office Visit Note: Visit Date: 06/02/2019 PCP: [name], MD Referred by: Argentina Donovan, PA-C  Subjective: Chief Complaint  Patient presents with  . left buttock pain after lifting toilet early last week   HPI: Jeremy [name] is a 58 y.o. male who comes in today with acute injury to left buttocks 6 days ago. He reports that he was lifting a 100 lb toilet seat using his leg has leverage when he felt a pop in his buttocks and a sharp pain that ran down his leg to his foot. Since the injury, he has been able to ambulate although it is painful. His fiance noticed that he had swelling in the back of his leg but no bruising. Has been taking methocarbamol which has helped pain but has been causing hiccups.   Reports that he has had a herniated disc in the past and pain felt differently- had more numbness/tingling in his leg and had to be very careful about how he sat.    ROS Otherwise per HPI.  Assessment & Plan: Visit Diagnoses:  1. Pain in left hip   Left buttocks pain that is either from proximal hamstring injury vs possible disc herniation compressing on sciatic nerve given history of spinal stenosis and prior disc herniation at L3-4 with laminectomy at L4-5. Will provide exercises for hamstring rehabilitation as well as disc herniation. If pain is not improving, he will return in several weeks for further imaging. Will provide new muscle relaxer   Plan:  - flexeril PRN for pain - PT referral and home exercises provided  Meds & Orders:  Meds ordered this encounter  Medications  . cyclobenzaprine (FLEXERIL) 10 MG tablet    Sig: Take 1 tablet (10 mg total) by mouth 3 (three) times daily as needed for muscle spasms.    Dispense:  60 tablet    Refill:  3    Orders Placed This Encounter  Procedures  . Ambulatory referral to Physical Therapy    Follow-up: No follow-ups on file.   Procedures: No  procedures performed  No notes on file   Clinical History: No specialty comments available.   He reports that he quit smoking about 30 years ago. He has never used smokeless tobacco.  Recent Labs    06/14/18 1356 11/19/18 0926  HGBA1C 6.4 6.9    Objective:  VS:  HT:    WT:   BMI:     BP:   HR: bpm  TEMP: ( )  RESP:  Physical Exam  PHYSICAL EXAM: Gen: NAD, alert, cooperative with exam, well-appearing HEENT: clear conjunctiva,  CV:  no edema, capillary refill brisk, normal rate Resp: non-labored Skin: no rashes, normal turgor  Neuro: no gross deficits.  Psych:  alert and oriented  Ortho Exam  Inspection: no swelling, bruising over back of left leg Palpation: TTP at ischial tuberosity, palpation causes shooting pain down leg ROM: full ROM of leg with extension, flexion, adduction, abduction Strength: good strength of hamstring- tested at 20 degrees, 45 degrees, 90 degrees. Pain with resisted extension at all fields Neurovascularly intact.  Spine: No abnormalities on inspection No TTP at spinous processes or paraspinal muscles Imaging: No results found.  Past Medical/Family/Surgical/Social History: Medications & Allergies reviewed per EMR, new medications updated. Patient Active Problem List   Diagnosis Date Noted  . Barrett's esophagus without dysplasia 05/19/2019  . OSA (obstructive sleep apnea) 04/30/2019  .  Iron deficiency anemia 11/25/2018  . Phimosis of penis 11/19/2018  . Esophageal dysphagia 08/19/2018  . Benign prostatic hyperplasia with post-void dribbling 01/31/2018  . Absence of bladder continence 01/31/2018  . Obesity (BMI 30-39.9) 11/05/2017  . GERD (gastroesophageal reflux disease) 06/03/2017  . Chronic pain of both knees 06/03/2017  . Chronic lower back pain 06/03/2017  . Type 2 diabetes mellitus without complication, without long-term current use of insulin ([facility]) 04/12/2016  . Essential hypertension 04/12/2016   Past Medical History:   Diagnosis Date  . Diabetes mellitus without complication ([facility])   . GERD (gastroesophageal reflux disease)   . Hyperlipidemia   . Hypertension   . Rash of entire body 03/2016   Family History  Problem Relation Age of Onset  . Pancreatic cancer Mother   . CAD Father   . Colon cancer Paternal Grandfather    Past Surgical History:  Procedure Laterality Date  . BACK SURGERY  1993   lumbar  . BIOPSY  02/07/2019   Procedure: BIOPSY;  Surgeon: West BaliFields, Sandi L, MD;  Location: [facility];  Service: Endoscopy;;  . CERVICAL SPINE SURGERY  2000  . COLONOSCOPY N/A 02/07/2019   Procedure: COLONOSCOPY;  Surgeon: West BaliFields, Sandi L, MD;  Location: [facility];  Service: Endoscopy;  Laterality: N/A;  10:30am  . ESOPHAGOGASTRODUODENOSCOPY N/A 02/07/2019   Procedure: ESOPHAGOGASTRODUODENOSCOPY (EGD);  Surgeon: West BaliFields, Sandi L, MD;  Location: [facility];  Service: Endoscopy;  Laterality: N/A;  . HAND SURGERY     Social History   Occupational History  . Occupation: Product/process development scientistgeneral contractor  Tobacco Use  . Smoking status: Former Smoker    Quit date: 10/16/1988    Years since quitting: 30.6  . Smokeless tobacco: Never Used  Substance and Sexual Activity  . Alcohol use: No  . Drug use: No  . Sexual activity: Not on file

## 2019-06-02 NOTE — Progress Notes (Signed)
I saw and examined the patient with Dr. Mayer Masker and agree with assessment and plan as outlined.  Left posterior hip pain after lifting a commode, possibilities include proximal hamstring strain/tendon tear versus recurrent lumbar disc herniation.  We will try home exercises, Flexeril as needed, physical therapy.  If no improvement, then additional imaging.

## 2019-06-03 ENCOUNTER — Telehealth: Payer: Self-pay

## 2019-06-03 NOTE — Telephone Encounter (Signed)
Contacted pt to go over Dr. Wynetta Emery message regarding sleep study pt states that case worker already contacted him regarding the cpap machine. Pt doesn't have any questions or concerns

## 2019-06-06 ENCOUNTER — Encounter: Payer: Self-pay | Admitting: Internal Medicine

## 2019-06-10 ENCOUNTER — Ambulatory Visit: Payer: No Typology Code available for payment source | Attending: Family Medicine | Admitting: Physical Therapy

## 2019-06-10 ENCOUNTER — Ambulatory Visit: Payer: Self-pay | Admitting: Gastroenterology

## 2019-06-10 ENCOUNTER — Telehealth: Payer: Self-pay

## 2019-06-10 ENCOUNTER — Other Ambulatory Visit: Payer: Self-pay

## 2019-06-10 ENCOUNTER — Encounter: Payer: Self-pay | Admitting: Physical Therapy

## 2019-06-10 DIAGNOSIS — M62838 Other muscle spasm: Secondary | ICD-10-CM | POA: Insufficient documentation

## 2019-06-10 DIAGNOSIS — M25552 Pain in left hip: Secondary | ICD-10-CM | POA: Insufficient documentation

## 2019-06-10 DIAGNOSIS — M6281 Muscle weakness (generalized): Secondary | ICD-10-CM | POA: Insufficient documentation

## 2019-06-10 NOTE — Telephone Encounter (Signed)
Call placed to [facility] to check on status of CPAP application. Spoke to [place] and confirmed that patient does not have insurance as [employer] will not cover this and he tested negative for COVID.  She will note in his record that he needs a hardship application.

## 2019-06-10 NOTE — Therapy (Signed)
Miguel [name] [place], [place], [address] Phone: [phone]   Fax:  [phone]  Physical Therapy Evaluation  Patient Details  Name: Jeremy [name] MRN: [id number] Date of Birth: 14-Jun-1961 Referring Provider (PT): Dr Eunice Blase   Encounter Date: 06/10/2019  PT End of Session - 06/10/19 0959    Visit Number  1    Number of Visits  4    Date for PT Re-Evaluation  07/08/19    PT Start Time  0922    PT Stop Time  1005    PT Time Calculation (min)  43 min    Activity Tolerance  Patient tolerated treatment well    Behavior During Therapy  [facility] for tasks assessed/performed       Past Medical History:  Diagnosis Date  . Diabetes mellitus without complication ([place])   . GERD (gastroesophageal reflux disease)   . Hyperlipidemia   . Hypertension   . Rash of entire body 03/2016    Past Surgical History:  Procedure Laterality Date  . BACK SURGERY  1993   lumbar  . BIOPSY  02/07/2019   Procedure: BIOPSY;  Surgeon: Danie Binder, MD;  [facility];  Service: Endoscopy;;  . CERVICAL SPINE SURGERY  2000  . COLONOSCOPY N/A 02/07/2019   Procedure: COLONOSCOPY;  Surgeon: Danie Binder, MD;  [facility];  Service: Endoscopy;  Laterality: N/A;  10:30am  . ESOPHAGOGASTRODUODENOSCOPY N/A 02/07/2019   Procedure: ESOPHAGOGASTRODUODENOSCOPY (EGD);  Surgeon: Danie Binder, MD;  [facility];  Service: Endoscopy;  Laterality: N/A;  . HAND SURGERY      There were no vitals filed for this visit.   Subjective Assessment - 06/10/19 0923    Subjective  Pt reports he was lifitng a commode and felt like someone thumped him in the Lt buttocks.  He used some OTC meds and still has discomfort.  Now has trouble moving from sit to stand and floor to stand.    Currently in Pain?  Yes    Pain Score  4     Pain Location  Buttocks    Pain Orientation  Left    Pain Descriptors / Indicators  Other  (Comment)    Pain Type  Acute pain    Pain Radiating Towards  feels like I am stiing on a rock.    Pain Onset  1 to 4 weeks ago    Pain Frequency  Constant    Aggravating Factors   transitioning from sit to stand, floor to stand, kicking heel of shoe off    Pain Relieving Factors  rest, muscle rub         [facility] PT Assessment - 06/10/19 0001      Assessment   Medical Diagnosis  Lt hip pain    Referring Provider (PT)  Dr Legrand Como Hilts    Onset Date/Surgical Date  05/26/19    Hand Dominance  Left    Next MD Visit  after PT if needed    Prior Therapy  not for this      Precautions   Precautions  None      Balance Screen   Has the patient fallen in the past 6 months  Yes    How many times?  1   floor mat slipped out from him   Has the patient had a decrease in activity level because of a fear of falling?   No    Is the  patient reluctant to leave their home because of a fear of falling?   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      Observation/Other Assessments   Focus on Therapeutic Outcomes (FOTO)   57% limited      Posture/Postural Control   Posture/Postural Control  Postural limitations    Postural Limitations  Rounded Shoulders;Forward head;Increased thoracic kyphosis      ROM / Strength   AROM / PROM / Strength  AROM;Strength      AROM   AROM Assessment Site  Lumbar;Hip    Right/Left Hip  Left;Right    Right Hip External Rotation   60    Right Hip Internal Rotation   5    Left Hip External Rotation   60    Left Hip Internal Rotation   0    Lumbar Flexion  to mid thigh - pulling into Lt buttocks    Lumbar Extension  WNL    Lumbar - Right Rotation  90% present    Lumbar - Left Rotation  WNL      Strength    Strength Assessment Site  Hip;Knee    Right/Left Hip  --   bilat WNL   Right/Left Knee  Left   Rt 5/5   Left Knee Flexion  4-/5   with pain into Lt buttocks   Left Knee Extension  4/5      Flexibility   Soft Tissue Assessment /Muscle Length  yes    Hamstrings  supine SLR Rt 84, Lt 80    Quadriceps  prone knee flexion WNL       Palpation   Palpation comment  tight and tender Lt biceps femoris mid range and Lt piriformis with palpable trigger pointsw                Objective measurements completed on examination: See above findings.      [facility] Adult PT Treatment/Exercise - 06/10/19 0001      Self-Care   Self-Care  Other Self-Care Comments    Other Self-Care Comments   self TPR using a ball and wooden dowel to Lt piriformis/gluts and hamstring.       Exercises   Exercises  Knee/Hip      Knee/Hip Exercises: Stretches   Passive Hamstring Stretch  Left;2 reps;30 seconds   holding behind knee in supine   Piriformis Stretch  Left;2 reps;20 seconds   lt LE crossed over Rt in hooklying            PT Education - 06/10/19 1001    Education Details  HEP an dPOC    Person(s) Educated  Patient    Methods  Explanation;Demonstration;Handout    Comprehension  Returned demonstration;Verbalized understanding          PT Long Term Goals - 06/10/19 1028      PT LONG TERM GOAL #1   Title  I with advanced HEP to include eccentric hamstring work ( 07/08/2019)    Time  4    Period  Weeks    Status  New    Target Date  07/08/19      PT  LONG TERM GOAL #2   Title  improve FOTO =/< 38% limited ( 07/08/2019)    Time  4    Period  Weeks    Status  New    Target Date  07/08/19      PT LONG TERM GOAL #3   Title  increase bilat hip IR =/> 15 to decrease LE stress during transfers ( 07/08/2019)    Time  4    Period  Weeks    Status  New    Target Date  07/08/19      PT LONG TERM GOAL #4   Title  transfer floor to stand at work per his previous level with minimal to  no Lt HS/buttock pain ( 07/08/2019)    Time  4    Period  Weeks    Status  New    Target Date  07/08/19      PT LONG TERM GOAL #5   Title  improve Lt knee strength to = Rt to allow him to easily climb into fire truck ( 07/08/2019)    Time  4    Period  Weeks    Status  New    Target Date  07/08/19             Plan - 06/10/19 1023    Clinical Impression Statement  58 yo male with ~ 2 wk h/o Lt buttock and thigh pain after hearing a pop while lifitng a commode.  He presents with palpable trigger points in the Lt rectus femoris and piriformis. He also has weakness in this hamstring.  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    Personal Factors and Comorbidities  Comorbidity 3+    Comorbidities  DM, HTN, arthritis, cervical fusion, lumbar surgery, sleep apnea    Examination-Activity Limitations  Squat;Transfers    Examination-Participation Restrictions  Other;Volunteer    Stability/Clinical Decision Making  Stable/Uncomplicated    Clinical Decision Making  Low    Rehab Potential  Excellent    PT Frequency  1x / week    PT Duration  4 weeks    PT Treatment/Interventions  Iontophoresis 4mg /ml Dexamethasone;Patient/family education;Moist Heat;Ultrasound;Therapeutic exercise;Dry needling;Taping;Manual techniques;Cryotherapy;Electrical Stimulation    PT Next Visit Plan  manual work to loosen up trigger points in Lt hamstring/piriformis, possible taping if no improvement, add in eccentric strengthening.    Consulted and Agree with Plan of Care  Patient       Patient will benefit from skilled therapeutic intervention in order to improve the following deficits and impairments:  Obesity, Increased muscle spasms, Pain, Decreased strength, Decreased mobility  Visit Diagnosis: Pain in left hip - Plan: PT plan of care cert/re-cert  Muscle weakness (generalized) - Plan: PT plan  of care cert/re-cert  Other muscle spasm - Plan: PT plan of care cert/re-cert     Problem List Patient Active Problem List   Diagnosis Date Noted  . Barrett's esophagus without dysplasia 05/19/2019  . OSA (obstructive sleep apnea) 04/30/2019  . Iron deficiency anemia 11/25/2018  . Phimosis of penis 11/19/2018  . Esophageal dysphagia 08/19/2018  . Benign prostatic hyperplasia with post-void dribbling 01/31/2018  . Absence of bladder continence 01/31/2018  . Obesity (BMI 30-39.9) 11/05/2017  . GERD (gastroesophageal reflux disease) 06/03/2017  . Chronic pain of both knees 06/03/2017  . Chronic lower back pain 06/03/2017  . Type 2 diabetes mellitus  without complication, without long-term current use of insulin ([facility]) 04/12/2016  . Essential hypertension 04/12/2016    Roderic ScarceSusan Shaver PT  06/10/2019, 10:33 AM  [facility] [address] [place], [place], [address] Phone: [phone]   Fax:  [phone]  Name: Annye AsaGary L Wiseman MRN: [id number] Date of Birth: 05/13/1961

## 2019-06-11 MED ORDER — ROPINIROLE HCL 0.25 MG PO TABS: 0.2500 mg | ORAL_TABLET | Freq: Every day | ORAL | 2 refills | Status: DC

## 2019-06-11 MED FILL — rOPINIRole HCL 0.25 MG TABS: 0.25 | 30 days supply | Qty: 30 | Fill #0

## 2019-06-13 ENCOUNTER — Other Ambulatory Visit: Payer: Self-pay | Admitting: Internal Medicine

## 2019-06-13 DIAGNOSIS — I10 Essential (primary) hypertension: Secondary | ICD-10-CM

## 2019-06-14 MED ORDER — LISINOPRIL 20 MG PO TABS: 20.0000 mg | ORAL_TABLET | Freq: Every day | ORAL | 2 refills | Status: DC

## 2019-06-16 MED FILL — metFORMIN HCL 1000 MG TABS: 1000 | 30 days supply | Qty: 30 | Fill #4

## 2019-06-16 MED FILL — TAMSULOSIN HCL 0.4 MG CAP: 0.4 | 30 days supply | Qty: 30 | Fill #4

## 2019-06-16 MED FILL — LISINOPRIL 20 MG TABLET: 20 | 30 days supply | Qty: 30 | Fill #0

## 2019-06-17 ENCOUNTER — Encounter: Payer: Self-pay | Admitting: Physical Therapy

## 2019-06-17 ENCOUNTER — Ambulatory Visit: Payer: No Typology Code available for payment source | Attending: Family Medicine | Admitting: Physical Therapy

## 2019-06-17 ENCOUNTER — Other Ambulatory Visit: Payer: Self-pay

## 2019-06-17 DIAGNOSIS — M25552 Pain in left hip: Secondary | ICD-10-CM | POA: Insufficient documentation

## 2019-06-17 DIAGNOSIS — M62838 Other muscle spasm: Secondary | ICD-10-CM | POA: Insufficient documentation

## 2019-06-17 DIAGNOSIS — M6281 Muscle weakness (generalized): Secondary | ICD-10-CM | POA: Insufficient documentation

## 2019-06-17 NOTE — Therapy (Signed)
[facility] [address] [place], [place], [address] Phone: [phone]   Fax:  [phone]  Physical Therapy Treatment  Patient Details  Name: Jeremy [name] MRN: [id number] Date of Birth: [birth year] Referring Provider (PT): Dr Lavada MesiMichael Hilts   Encounter Date: 06/17/2019  PT End of Session - 06/17/19 1025    Visit Number  2    Number of Visits  4    Date for PT Re-Evaluation  07/08/19    Authorization Type  CAFA 100%    PT Start Time  1019    PT Stop Time  1100    PT Time Calculation (min)  41 min       Past Medical History:  Diagnosis Date  . Diabetes mellitus without complication ([facility])   . GERD (gastroesophageal reflux disease)   . Hyperlipidemia   . Hypertension   . Rash of entire body 03/2016    Past Surgical History:  Procedure Laterality Date  . BACK SURGERY  1993   lumbar  . BIOPSY  02/07/2019   Procedure: BIOPSY;  Surgeon: West BaliFields, Sandi L, MD;  [facility];  Service: Endoscopy;;  . CERVICAL SPINE SURGERY  2000  . COLONOSCOPY N/A 02/07/2019   Procedure: COLONOSCOPY;  Surgeon: West BaliFields, Sandi L, MD;  [facility];  Service: Endoscopy;  Laterality: N/A;  10:30am  . ESOPHAGOGASTRODUODENOSCOPY N/A 02/07/2019   Procedure: ESOPHAGOGASTRODUODENOSCOPY (EGD);  Surgeon: West BaliFields, Sandi L, MD;  [facility];  Service: Endoscopy;  Laterality: N/A;  . HAND SURGERY      There were no vitals filed for this visit.  Subjective Assessment - 06/17/19 1023    Subjective  Pt reports he is about the same.                       [facility] Adult PT Treatment/Exercise - 06/17/19 0001      Knee/Hip Exercises: Stretches   Passive Hamstring Stretch  3 reps;30 seconds    Passive Hamstring Stretch Limitations  supine with strap     Piriformis Stretch  3 reps;30 seconds    Piriformis Stretch Limitations  modified knee to opp shoulder    also in sitting     Knee/Hip Exercises: Aerobic   Nustep  L3 x 5minutes    reduced intensity from level 5 to level 3 due to c/o knee      Knee/Hip Exercises: Supine   Quad Sets  10 reps    Bridges  10 reps    Bridges with Harley-DavidsonBall Squeeze  10 reps    Straight Leg Raises  10 reps    Other Supine Knee/Hip Exercises  ball squeeze      Manual Therapy   Manual therapy comments  Prone manual TPR to pleft piriformis and passive IR stretching              PT Education - 06/17/19 1101    Education Details  HEP    Person(s) Educated  Patient    Methods  Explanation;Handout    Comprehension  Verbalized understanding          PT Long Term Goals - 06/10/19 1028      PT LONG TERM GOAL #1   Title  I with advanced HEP to include eccentric hamstring work ( 07/08/2019)    Time  4    Period  Weeks    Status  New    Target Date  07/08/19  PT LONG TERM GOAL #2   Title  improve FOTO =/< 38% limited ( 07/08/2019)    Time  4    Period  Weeks    Status  New    Target Date  07/08/19      PT LONG TERM GOAL #3   Title  increase bilat hip IR =/> 15 to decrease LE stress during transfers ( 07/08/2019)    Time  4    Period  Weeks    Status  New    Target Date  07/08/19      PT LONG TERM GOAL #4   Title  transfer floor to stand at work per his previous level with minimal to no Lt HS/buttock pain ( 07/08/2019)    Time  4    Period  Weeks    Status  New    Target Date  07/08/19      PT LONG TERM GOAL #5   Title  improve Lt knee strength to = 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 Increased lateral knee pain on Nustep, needs cues to keep knees aligned on Nustep. Reviewed HEP and progressed with hip strength. Trigger point release performed in prone to left piriformis with passive hip IR stretching. Encouraged tennis ball use at  home. Updated HEP.    PT Next Visit Plan  manual work to loosen up trigger points in Lt hamstring/piriformis, possible taping if no improvement, add in eccentric strengthening.    PT Home Exercise Plan  bridge with ball, Sit-stand, SLR , piriformis stretch, hamstring stretch       Patient will benefit from skilled therapeutic intervention in order to improve the following deficits and impairments:  Obesity, Increased muscle spasms, Pain, Decreased strength, Decreased mobility  Visit Diagnosis: Pain in left hip  Muscle weakness (generalized)  Other muscle spasm     Problem List Patient Active Problem List   Diagnosis Date Noted  . Barrett's esophagus without dysplasia 05/19/2019  . OSA (obstructive sleep apnea) 04/30/2019  . Iron deficiency anemia 11/25/2018  . Phimosis of penis 11/19/2018  . Esophageal dysphagia 08/19/2018  . Benign prostatic hyperplasia with post-void dribbling 01/31/2018  . Absence of bladder continence 01/31/2018  . Obesity (BMI 30-39.9) 11/05/2017  . GERD (gastroesophageal reflux disease) 06/03/2017  . Chronic pain of both knees 06/03/2017  . Chronic lower back pain 06/03/2017  . Type 2 diabetes mellitus without complication, without long-term current use of insulin ([place]) 04/12/2016  . Essential hypertension 04/12/2016    Dorene Ar, PTA 06/17/2019, 11:09 AM  [facility] [address] [place], [place], [address] Phone: [phone]   Fax:  [phone]  Name: Jeremy [name] MRN: [id number] Date of Birth: [birth year]

## 2019-06-18 ENCOUNTER — Telehealth: Payer: Self-pay

## 2019-06-18 NOTE — Telephone Encounter (Signed)
Call placed to [facility], spoke to Patience who stated that they have all documentation needed for the CPAP order. The patient is supposed to come to their store today to complete the hardship application.

## 2019-06-24 ENCOUNTER — Encounter: Payer: Self-pay | Admitting: Physical Therapy

## 2019-06-24 ENCOUNTER — Ambulatory Visit: Payer: No Typology Code available for payment source | Admitting: Physical Therapy

## 2019-06-24 ENCOUNTER — Other Ambulatory Visit: Payer: Self-pay

## 2019-06-24 DIAGNOSIS — M6281 Muscle weakness (generalized): Secondary | ICD-10-CM

## 2019-06-24 DIAGNOSIS — M25552 Pain in left hip: Secondary | ICD-10-CM

## 2019-06-24 DIAGNOSIS — M62838 Other muscle spasm: Secondary | ICD-10-CM

## 2019-06-24 NOTE — Therapy (Signed)
[place], [place], [address] Phone: [phone]   Fax:  [phone]  Physical Therapy Treatment  Patient Details  Name: Jeremy [name] MRN: [id number] Date of Birth: 06-17-1961 Referring Provider (PT): Dr Eunice Blase   Encounter Date: 06/24/2019  PT End of Session - 06/24/19 0924    Visit Number  3    Number of Visits  4    Date for PT Re-Evaluation  07/08/19    Authorization Type  CAFA 100%    PT Start Time  0924    PT Stop Time  1013    PT Time Calculation (min)  49 min    Activity Tolerance  Patient tolerated treatment well    Behavior During Therapy  [facility] for tasks assessed/performed       Past Medical History:  Diagnosis Date  . Diabetes mellitus without complication ([place])   . GERD (gastroesophageal reflux disease)   . Hyperlipidemia   . Hypertension   . Rash of entire body 03/2016    Past Surgical History:  Procedure Laterality Date  . BACK SURGERY  1993   lumbar  . BIOPSY  02/07/2019   Procedure: BIOPSY;  Surgeon: Danie Binder, MD;  [facility];  Service: Endoscopy;;  . CERVICAL SPINE SURGERY  2000  . COLONOSCOPY N/A 02/07/2019   Procedure: COLONOSCOPY;  Surgeon: Danie Binder, MD;  [facility];  Service: Endoscopy;  Laterality: N/A;  10:30am  . ESOPHAGOGASTRODUODENOSCOPY N/A 02/07/2019   Procedure: ESOPHAGOGASTRODUODENOSCOPY (EGD);  Surgeon: Danie Binder, MD;  [facility];  Service: Endoscopy;  Laterality: N/A;  . HAND SURGERY      There were no vitals filed for this visit.  Subjective Assessment - 06/24/19 0925    Subjective  Pt reports he feels alittle better, was very sore after the last visit and it settled down two days later. He has been using the ball for self TPR, Rt heel has flared up - has a bone spur and he gets injections for this.    Currently in Pain?  No/denies         [facility] PT Assessment - 06/24/19 0001      Assessment    Medical Diagnosis  Lt hip pain    Referring Provider (PT)  Dr Legrand Como Hilts    Onset Date/Surgical Date  05/26/19      AROM   Left Hip Internal Rotation   22                   [facility] Adult PT Treatment/Exercise - 06/24/19 0001      Exercises   Exercises  Knee/Hip      Knee/Hip Exercises: Stretches   ITB Stretch  Left;3 reps;30 seconds   cross body with strap in supine   Piriformis Stretch  Left;3 reps;30 seconds    Piriformis Stretch Limitations  progressed to using strap behind Rt knee and pulling up    Gastroc Stretch  1 rep;30 seconds;Left    Other Knee/Hip Stretches  3x30 sec Lt SKTC    Other Knee/Hip Stretches  lateral Lt hip stretch with trunk rotation      Knee/Hip Exercises: Aerobic   Nustep  L3 x 19mnutes       Knee/Hip Exercises: Standing   SLS  10 reps left modified dead lifts     Other Standing Knee Exercises  2x10 half kneel to stand pressing up through [place] R[place] knee  on 8" stool - fatigued  at end of reps.    limited d/t lateral Lt knee pain     Knee/Hip Exercises: Prone   Hamstring Curl  2 sets;10 reps   focus on eccentric motion   Hamstring Curl Limitations  using 7.5#    Other Prone Exercises  15 reps bent knee hip rotation IR/ER       Manual Therapy   Manual therapy comments  Prone manual TPR to left piriformis and hamstring, passive IR stretching . Pt only reports tenderness at HS origin and piriformis. Nothing into muscle belly of Lt hamstring   STM/myofascial release to lateral Lt knee + ice massage                 PT Long Term Goals - 06/24/19 5462      PT LONG TERM GOAL #1   Title  I with advanced HEP to include eccentric hamstring work ( 07/08/2019)    Status  On-going      PT LONG TERM GOAL #2   Title  improve FOTO =/< 38% limited ( 07/08/2019)    Status  On-going      PT LONG TERM GOAL #3   Title  increase bilat hip IR =/> 15 to decrease LE stress during transfers ( 07/08/2019)    Status  Achieved      PT LONG  TERM GOAL #4   Title  transfer floor to stand at work per his previous level with minimal to no Lt HS/buttock pain ( 07/08/2019)    Baseline  about the same    Status  On-going      PT LONG TERM GOAL #5   Title  improve Lt knee strength to = 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  He no longer has pain in theLt hamstring muscle belly, it is currently at the origin/ischial tuberosity and into the piriformis.  Pain and fatigue with performinghalf kneel to stand and in lateral Left knee.  There is a palpable nodule that is tender.  If ischial tuberosity pain conitnues he may benefit form iontophoresis.  He is due for a reassesment at next visit.    Rehab Potential  Excellent    PT Frequency  1x / week    PT Duration  4 weeks    PT Treatment/Interventions  Iontophoresis 75m/ml Dexamethasone;Patient/family education;Moist Heat;Ultrasound;Therapeutic exercise;Dry needling;Taping;Manual techniques;Cryotherapy;Electrical Stimulation    PT Next Visit Plan  FOTO and reassess    Consulted and Agree with Plan of Care  Patient       Patient will benefit from skilled therapeutic intervention in order to improve the following deficits and impairments:  Obesity, Increased muscle spasms, Pain, Decreased strength, Decreased mobility  Visit Diagnosis: Pain in left hip  Muscle weakness (generalized)  Other muscle spasm     Problem List Patient Active Problem List   Diagnosis Date Noted  . Barrett's esophagus without dysplasia 05/19/2019  . OSA (obstructive sleep apnea) 04/30/2019  . Iron deficiency anemia 11/25/2018  . Phimosis of penis 11/19/2018  . Esophageal dysphagia 08/19/2018  . Benign prostatic hyperplasia with post-void dribbling 01/31/2018  . Absence of bladder  continence 01/31/2018  .  Obesity (BMI 30-39.9) 11/05/2017  . GERD (gastroesophageal reflux disease) 06/03/2017  . Chronic pain of both knees 06/03/2017  . Chronic lower back pain 06/03/2017  . Type 2 diabetes mellitus without complication, without long-term current use of insulin ([place]) 04/12/2016  . Essential hypertension 04/12/2016    Jeral Pinch PT  06/24/2019, 10:26 AM  [facility] [address] [place], [place], [address] Phone: [phone]   Fax:  [phone]  Name: Jeremy [name] MRN: [id number] Date of Birth: 1961-02-03

## 2019-06-27 ENCOUNTER — Telehealth: Payer: Self-pay

## 2019-06-27 NOTE — Telephone Encounter (Signed)
Call placed to [facility], spoke to [place] who stated that per her records, the patient picked up the CPAP on 06/25/2019.

## 2019-07-01 ENCOUNTER — Ambulatory Visit: Payer: No Typology Code available for payment source | Admitting: Physical Therapy

## 2019-07-01 ENCOUNTER — Encounter: Payer: Self-pay | Admitting: Physical Therapy

## 2019-07-01 ENCOUNTER — Other Ambulatory Visit: Payer: Self-pay

## 2019-07-01 DIAGNOSIS — M25552 Pain in left hip: Secondary | ICD-10-CM

## 2019-07-01 DIAGNOSIS — M6281 Muscle weakness (generalized): Secondary | ICD-10-CM

## 2019-07-01 DIAGNOSIS — M62838 Other muscle spasm: Secondary | ICD-10-CM

## 2019-07-01 NOTE — Therapy (Signed)
[place], [place], [address] Phone: [phone]   Fax:  [phone]  Physical Therapy Treatment/Discharge Patient Details  Name: Jeremy [name] MRN: [id number] Date of Birth: November 20, 1960 Referring Provider (PT): Dr Eunice Blase   Encounter Date: 07/01/2019  PT End of Session - 07/01/19 1053    Visit Number  4    Number of Visits  4    Date for PT Re-Evaluation  07/08/19    Authorization Type  CAFA 100%    PT Start Time  1021    PT Stop Time  1056    PT Time Calculation (min)  35 min    Activity Tolerance  Patient tolerated treatment well    Behavior During Therapy  [facility] for tasks assessed/performed       Past Medical History:  Diagnosis Date  . Diabetes mellitus without complication ([place])   . GERD (gastroesophageal reflux disease)   . Hyperlipidemia   . Hypertension   . Rash of entire body 03/2016    Past Surgical History:  Procedure Laterality Date  . BACK SURGERY  1993   lumbar  . BIOPSY  02/07/2019   Procedure: BIOPSY;  Surgeon: Danie Binder, MD;  [facility];  Service: Endoscopy;;  . CERVICAL SPINE SURGERY  2000  . COLONOSCOPY N/A 02/07/2019   Procedure: COLONOSCOPY;  Surgeon: Danie Binder, MD;  [facility];  Service: Endoscopy;  Laterality: N/A;  10:30am  . ESOPHAGOGASTRODUODENOSCOPY N/A 02/07/2019   Procedure: ESOPHAGOGASTRODUODENOSCOPY (EGD);  Surgeon: Danie Binder, MD;  Location[facility];  Service: Endoscopy;  Laterality: N/A;  . HAND SURGERY      There were no vitals filed for this visit.  Subjective Assessment - 07/01/19 1024    Subjective  Pt reports he is feeling better, mainly has pain at the base of the buttocks in the mornings and evenings.    Currently in Pain?  Yes    Pain Score  4     Pain Location  Buttocks    Pain Orientation  Left    Pain Descriptors / Indicators  Other (Comment)   feels like sitting on a rock   Pain Type  Acute  pain    Pain Onset  More than a month ago    Pain Frequency  Intermittent    Aggravating Factors   sitting ,    Pain Relieving Factors  rest         [facility] PT Assessment - 07/01/19 0001      Assessment   Medical Diagnosis  Lt hip pain    Referring Provider (PT)  Dr Legrand Como Hilts    Onset Date/Surgical Date  05/26/19      Observation/Other Assessments   Focus on Therapeutic Outcomes (FOTO)   35% limited                   [facility] Adult PT Treatment/Exercise - 07/01/19 0001      Knee/Hip Exercises: Stretches   Passive Hamstring Stretch  Left;30 seconds   seated   Other Knee/Hip Stretches  LTR 5x 10 sec eaach side      Knee/Hip Exercises: Aerobic   Nustep  L4 x 12mnutes       Knee/Hip Exercises: Standing   Other Standing Knee Exercises  3x10 kick backs iwth Left LE , green band as resistance.       Knee/Hip Exercises: Seated   Other Seated Knee/Hip Exercises  half kneel  to stand leading , 2/10 pain with Lt LE    Other Seated Knee/Hip Exercises  3x10 IR, ER  green band     Sit to Sand  5 reps;without UE support   progressed to modified single leg Lt      Knee/Hip Exercises: Supine   Other Supine Knee/Hip Exercises  head presses and then seated cervical rotation to stretch out sore neck             PT Education - 07/01/19 1053    Education Details  HEP and discharge plan to continue progression    Person(s) Educated  Patient    Methods  Explanation;Demonstration;Handout    Comprehension  Returned demonstration;Verbalized understanding          PT Long Term Goals - 07/01/19 1030      PT LONG TERM GOAL #1   Title  I with advanced HEP to include eccentric hamstring work ( 07/08/2019)    Status  Achieved      PT LONG TERM GOAL #2   Title  improve FOTO =/< 38% limited ( 07/08/2019)    Baseline  35% limited    Status  Achieved      PT LONG TERM GOAL #3   Title  increase bilat hip IR =/> 15 to decrease LE stress during transfers ( 07/08/2019)     Status  Achieved      PT LONG TERM GOAL #4   Title  transfer floor to stand at work per his previous level with minimal to no Lt HS/buttock pain ( 07/08/2019)    Baseline  2/10 pain - not back to baseline yet    Status  Partially Met      PT LONG TERM GOAL #5   Title  improve Lt knee strength to = Rt 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  He does still have some pain at the origin of the Lt hamstrings - possibly some tendonitis.  He will ice, perform trigger point releases and continue with eccentric hamstring work. He has met all goals.    PT Next Visit Plan  pt discharged to HEP       Patient will benefit from skilled therapeutic intervention in order to improve the following deficits and impairments:     Visit Diagnosis: No diagnosis found.     Problem List Patient Active Problem List   Diagnosis Date Noted  . Barrett's esophagus without dysplasia 05/19/2019  . OSA (obstructive sleep apnea) 04/30/2019  . Iron deficiency anemia 11/25/2018  . Phimosis of penis 11/19/2018  . Esophageal dysphagia 08/19/2018  . Benign prostatic hyperplasia with post-void dribbling 01/31/2018  . Absence of bladder continence 01/31/2018  . Obesity (BMI 30-39.9) 11/05/2017  . GERD (gastroesophageal reflux disease) 06/03/2017  . Chronic pain of both knees 06/03/2017  . Chronic lower back pain 06/03/2017  . Type 2 diabetes mellitus without complication, without long-term current use of insulin ([place]) 04/12/2016  . Essential hypertension 04/12/2016    Jeral Pinch PT 07/01/2019, 11:50 AM  [facility] [address] [place], [place], [address] Phone: [phone]   Fax:  [phone]  Name: Jeremy [name] MRN: [id number] Date of Birth: 05/18/1961   PHYSICAL THERAPY DISCHARGE SUMMARY  Visits from Start of Care: 4  Current functional level related to goals / functional outcomes: See above   Remaining deficits: Intermittent pain at origin of Lt hamstring    Education / Equipment: HEP  Plan: Patient agrees to discharge.  Patient goals were partially met. Patient is being discharged due to being pleased with the current functional level.  ?????    Jeral Pinch, PT 07/01/19 11:51 AM

## 2019-07-02 ENCOUNTER — Other Ambulatory Visit: Payer: Self-pay | Admitting: Physician Assistant

## 2019-07-02 DIAGNOSIS — T148XXA Other injury of unspecified body region, initial encounter: Secondary | ICD-10-CM

## 2019-07-02 MED FILL — CYCLOBENZAPRINE 10 MG TAB: 10 | 20 days supply | Qty: 60 | Fill #1

## 2019-07-02 MED FILL — glipiZIDE 5 MG TABS: 5 | 30 days supply | Qty: 90 | Fill #4

## 2019-07-03 MED FILL — NAPROXEN 500 MG TABLET: 500 | 30 days supply | Qty: 60 | Fill #0

## 2019-07-08 ENCOUNTER — Ambulatory Visit: Payer: No Typology Code available for payment source | Admitting: Physical Therapy

## 2019-07-17 MED FILL — rOPINIRole HCL 0.25 MG TABS: 0.25 | 30 days supply | Qty: 30 | Fill #1

## 2019-07-28 ENCOUNTER — Other Ambulatory Visit: Payer: Self-pay | Admitting: Family Medicine

## 2019-07-28 DIAGNOSIS — N3943 Post-void dribbling: Secondary | ICD-10-CM

## 2019-07-28 MED FILL — TAMSULOSIN HCL 0.4 MG CAP: 0.4 | 30 days supply | Qty: 30 | Fill #0

## 2019-07-28 MED FILL — LISINOPRIL 20 MG TABLET: 20 | 30 days supply | Qty: 30 | Fill #1

## 2019-07-29 ENCOUNTER — Ambulatory Visit: Payer: No Typology Code available for payment source | Admitting: Podiatry

## 2019-07-29 ENCOUNTER — Other Ambulatory Visit: Payer: Self-pay

## 2019-07-29 DIAGNOSIS — M722 Plantar fascial fibromatosis: Secondary | ICD-10-CM

## 2019-08-04 ENCOUNTER — Other Ambulatory Visit: Payer: Self-pay | Admitting: Internal Medicine

## 2019-08-04 DIAGNOSIS — T148XXA Other injury of unspecified body region, initial encounter: Secondary | ICD-10-CM

## 2019-08-04 MED FILL — PANTOPRAZOLE SOD DR 40 MG T: 40 | 30 days supply | Qty: 60 | Fill #2

## 2019-08-04 MED FILL — NAPROXEN 500 MG TABLET: 500 | 30 days supply | Qty: 60 | Fill #0

## 2019-08-04 MED FILL — metFORMIN HCL 1000 MG TABS: 1000 | 30 days supply | Qty: 30 | Fill #5

## 2019-08-11 MED FILL — rOPINIRole HCL 0.25 MG TABS: 0.25 | 30 days supply | Qty: 30 | Fill #2

## 2019-08-11 MED FILL — CYCLOBENZAPRINE 10 MG TAB: 10 | 20 days supply | Qty: 60 | Fill #2

## 2019-08-12 ENCOUNTER — Telehealth: Payer: Self-pay | Admitting: Gastroenterology

## 2019-08-12 ENCOUNTER — Encounter: Payer: Self-pay | Admitting: Gastroenterology

## 2019-08-12 ENCOUNTER — Ambulatory Visit (INDEPENDENT_AMBULATORY_CARE_PROVIDER_SITE_OTHER): Payer: Self-pay | Admitting: Gastroenterology

## 2019-08-12 ENCOUNTER — Other Ambulatory Visit: Payer: Self-pay

## 2019-08-12 VITALS — BP 129/73 | HR 82 | Temp 97.0°F | Ht 70.0 in | Wt 230.4 lb

## 2019-08-12 DIAGNOSIS — K2100 Gastro-esophageal reflux disease with esophagitis, without bleeding: Secondary | ICD-10-CM

## 2019-08-12 DIAGNOSIS — D509 Iron deficiency anemia, unspecified: Secondary | ICD-10-CM

## 2019-08-12 MED ORDER — RABEPRAZOLE SODIUM 20 MG PO TBEC: 20.0000 mg | DELAYED_RELEASE_TABLET | Freq: Every day | ORAL | 5 refills | Status: DC

## 2019-08-12 MED FILL — RABEPRAZOLE SOD DR 20 MG TA: 20 | 30 days supply | Qty: 30 | Fill #0

## 2019-08-12 NOTE — Telephone Encounter (Signed)
[phone] PATIENT HAS A QUESTION ABOUT WHEN TO TAKE HIS PRESCRIPTION, STATED BOTTLE SAYS IN THE MORNING, BUT HIS ABD PAIN IS AT NIGHT

## 2019-08-12 NOTE — Assessment & Plan Note (Signed)
IDA as outlined.  Previously recommended to have an EGD with capsule placement to complete IDA work-up.  Patient has postponed due to the inability to quarantine for Covid testing prior to procedure.  At this time he would like to follow-up with CBC, iron studies before deciding whether to pursue capsule placement.  Discussed at length the reasoning behind capsule study is because his IDA was not explained by prior EGD and colonoscopy findings.  Cannot rule out possibility of occult malignancy.

## 2019-08-12 NOTE — Telephone Encounter (Signed)
Reminder in epic °

## 2019-08-12 NOTE — Telephone Encounter (Signed)
Spoke with pt he is aware that he should take his medication in the mornings to try to prevent having Gerd symptoms all day. If he takes it in the mornings and doesn't notice a change, pt should contact our office.

## 2019-08-12 NOTE — Progress Notes (Signed)
Primary Care Physician: [name], MD  Primary Gastroenterologist:  Barney Drain, MD   Chief Complaint  Patient presents with  . Gastroesophageal Reflux    as of yesterday taking pantoprazole every 3rd day. When taking daily makes him feel bloated and will only have BM once a week.    HPI: Jeremy [name] is a 58 y.o. male here for follow-up.  Seen back in February for GERD, dysphagia, IDA.  He had had a 1 g drop in hemoglobin along with low iron saturation was 12%, ferritin low at 16 with normal B12 and TIBC.  He underwent an EGD and colonoscopy in April 2020.  He had newly diagnosed Barrett's esophagus without dysplasia, chronic inactive gastritis but no H. 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  External hemorrhoids but otherwise normal colon.  Next colonoscopy planned for 10 years.  It was advised that he complete a EGD with capsule placement to complete IDA work-up.  Patient has postponed mostly in part due to the inability to be quarantined after Covid testing in preparation for another procedure.  He has done this several times this year did not feel like he could take more time off of work.  Repeat labs in May showed normal hemoglobin of 13.4 on iron.  Patient states he stopped iron 3 months ago.  He has changed his pantoprazole.  When he was taken twice daily he would have a bowel movement only once per week.  Also felt bloated.  When he decreased dose to every third day, he has a bowel movement about 3 times per week.  No melena or rectal bleeding.  He has gained 10 pounds in the past couple of months.  He tried stopping pantoprazole but about after 2 weeks he had severe recurrent reflux and will take several days to get symptoms back under control after resuming medication.  Dairy causes more bloating and gas.  In the  past failed omeprazole.  Current Outpatient Medications  Medication Sig Dispense Refill  . acetaminophen (TYLENOL) 500 MG tablet Take 1,000 mg by mouth 2 (two) times daily.     Marland Kitchen alum & mag hydroxide-simeth (MAALOX/MYLANTA) 200-200-20 MG/5ML suspension Take 15 mLs by mouth every 6 (six) hours as needed for indigestion or heartburn.    . Blood Glucose Monitoring Suppl (TRUE METRIX METER) w/Device KIT 1 each by Does not apply route 4 (four) times daily -  before meals and at bedtime. (Patient taking differently: 1 each by Does not apply route 4 (four) times daily -  before meals and at bedtime. 1-2 times per month) 1 kit 0  . cyclobenzaprine (FLEXERIL) 10 MG tablet Take 1 tablet (10 mg total) by mouth 3 (three) times daily as needed for muscle spasms. 60 tablet 3  . diphenhydrAMINE (BENADRYL) 25 MG tablet Take 50 mg by mouth every 6 (six) hours as needed for itching or allergies (bee stings).     Marland Kitchen glipiZIDE (GLUCOTROL) 5 MG tablet Take 1 tablets PO in the mornings and 1 tablet in the evenings. 60 tablet 6  . glucose blood (TRUE METRIX BLOOD GLUCOSE TEST) test strip Use as instructed 100 each 12  . Liniments (BLUE-EMU SUPER STRENGTH EX) Apply 1 application topically daily as needed (pain).    Marland Kitchen lisinopril (ZESTRIL) 20 MG tablet Take 1 tablet (20 mg total) by mouth daily.  90 tablet 2  . loperamide (IMODIUM A-D) 2 MG tablet Take 2-4 mg by mouth as needed for diarrhea or loose stools.    Marland Kitchen loratadine (CLARITIN) 10 MG tablet Take 10 mg by mouth daily.    . metFORMIN (GLUCOPHAGE) 1000 MG tablet Take 0.5 tablets (500 mg total) by mouth 2 (two) times daily with a meal. 90 tablet 3  . miconazole (MICOTIN) 2 % cream Apply 1 application topically 2 (two) times daily. 28.35 g 0  . naproxen (NAPROSYN) 500 MG tablet TAKE 1 TABLET (500 MG TOTAL) BY MOUTH 2 (TWO) TIMES DAILY WITH A MEAL. 60 tablet 0  . pantoprazole (PROTONIX) 40 MG tablet Take 1 tablet (40 mg total) by mouth 2 (two) times daily before a meal.  (Patient taking differently: Take 40 mg by mouth. Every 3rd day) 180 tablet 3  . Potassium 99 MG TABS Take 99 mg by mouth daily.    Marland Kitchen rOPINIRole (REQUIP) 0.25 MG tablet Take 1 tablet (0.25 mg total) by mouth at bedtime. 30 tablet 2  . tamsulosin (FLOMAX) 0.4 MG CAPS capsule TAKE 1 CAPSULE (0.4 MG TOTAL) BY MOUTH DAILY. 30 capsule 3  . TRUEplus Lancets 28G MISC Check blood sugar fasting and before meals and again if pt feels bad (symptoms of hypo). 100 each 12   No current facility-administered medications for this visit.     Allergies as of 08/12/2019 - Review Complete 08/12/2019  Allergen Reaction Noted  . Cymbalta [duloxetine hcl] Other (See Comments) 06/05/2017  . Other Nausea And Vomiting 01/24/2019  . Robaxin [methocarbamol] Other (See Comments) 06/02/2019    ROS:  General: Negative for anorexia, weight loss, fever, chills, fatigue, weakness. ENT: Negative for hoarseness, difficulty swallowing , nasal congestion. CV: Negative for chest pain, angina, palpitations, dyspnea on exertion, peripheral edema.  Respiratory: Negative for dyspnea at rest, dyspnea on exertion, cough, sputum, wheezing.  GI: See history of present illness. GU:  Negative for dysuria, hematuria, urinary incontinence, urinary frequency, nocturnal urination.  Endo: Negative for unusual weight change.    Physical Examination:   BP 129/73   Pulse 82   Temp (!) 97 F (36.1 C) (Oral)   Ht '5\' 10"'$  (1.778 m)   Wt 230 lb 6.4 oz (104.5 kg)   BMI 33.06 kg/m   General: Well-nourished, well-developed in no acute distress.  Eyes: No icterus. Mouth: Oropharyngeal mucosa moist and pink , no lesions erythema or exudate. Lungs: Clear to auscultation bilaterally.  Heart: Regular rate and rhythm, no murmurs rubs or gallops.  Abdomen: Bowel sounds are normal, nontender, nondistended, no hepatosplenomegaly or masses, no abdominal bruits or hernia , no rebound or guarding.   Extremities: No lower extremity edema. No clubbing  or deformities. Neuro: Alert and oriented x 4   Skin: Warm and dry, no jaundice.   Psych: Alert and cooperative, normal mood and affect.  Labs:  Lab Results  Component Value Date   CREATININE 0.74 (L) 11/19/2018   BUN 15 11/19/2018   NA 137 11/19/2018   K 4.1 11/19/2018   CL 101 11/19/2018   CO2 21 11/19/2018   Lab Results  Component Value Date   WBC 5.2 03/13/2019   HGB 13.4 03/13/2019   HCT 40.3 03/13/2019   MCV 90 03/13/2019   PLT 192 03/13/2019   Lab Results  Component Value Date   IRON 42 11/19/2018   TIBC 339 11/19/2018   FERRITIN 16 (L) 11/19/2018   Lab Results  Component Value Date   VITAMINB12 469 11/19/2018  No results found for: FOLATE  Imaging Studies: No results found.

## 2019-08-12 NOTE — Telephone Encounter (Signed)
ASGE RECOMMENDS EGD IN 5 YEARS NOT 3.

## 2019-08-12 NOTE — Assessment & Plan Note (Signed)
Pleasant 58 year old gentleman with history of chronic GERD, newly diagnosed Barrett's esophagus without dysplasia, presenting for follow-up.  Typical reflux symptoms well controlled on pantoprazole however he feels that regular pantoprazole use is causing bloating and constipation.  He is not able to stop pantoprazole without recurrent heartburn/chest pain/abdominal pain.  He needs to maintain PPI for history of Barrett's.  We will try AcipHex 20 mg daily before breakfast.  He will call if he has persistent bloating and constipation, or poorly controlled reflux symptoms.  He will need surveillance EGD in 3 years.

## 2019-08-12 NOTE — Patient Instructions (Addendum)
1. Stop pantoprazole. 2. Start Aciphex 20mg  daily before breakfast.  3. Please go for labs. We will contact you with results and next step.  4. Call if you feel like your bowel movements are still not effective after stopping pantoprazole. We can add something like Miralax one capful daily.

## 2019-08-12 NOTE — Telephone Encounter (Signed)
Please NIC for Barrett's surveillance in 3 years (01/2022).   FYI SLF. Looks like Barrett's surveillance in 3 years, new dx EGD in 01/2019, 4 quadrants bx every 2 cm were negative for dysplasia.

## 2019-08-13 ENCOUNTER — Ambulatory Visit: Payer: Self-pay | Attending: Family Medicine

## 2019-08-13 DIAGNOSIS — K2100 Gastro-esophageal reflux disease with esophagitis, without bleeding: Secondary | ICD-10-CM

## 2019-08-13 DIAGNOSIS — D509 Iron deficiency anemia, unspecified: Secondary | ICD-10-CM

## 2019-08-14 ENCOUNTER — Encounter: Payer: Self-pay | Admitting: Internal Medicine

## 2019-08-14 LAB — CBC WITH DIFFERENTIAL/PLATELET
Basophils Absolute: 0.1 10*3/uL (ref 0.0–0.2)
Basos: 1 %
EOS (ABSOLUTE): 0.4 10*3/uL (ref 0.0–0.4)
Eos: 6 %
Hematocrit: 40.7 % (ref 37.5–51.0)
Hemoglobin: 13.5 g/dL (ref 13.0–17.7)
Immature Grans (Abs): 0.1 10*3/uL (ref 0.0–0.1)
Immature Granulocytes: 1 %
Lymphocytes Absolute: 2 10*3/uL (ref 0.7–3.1)
Lymphs: 30 %
MCH: 30.1 pg (ref 26.6–33.0)
MCHC: 33.2 g/dL (ref 31.5–35.7)
MCV: 91 fL (ref 79–97)
Monocytes Absolute: 0.6 10*3/uL (ref 0.1–0.9)
Monocytes: 9 %
Neutrophils Absolute: 3.6 10*3/uL (ref 1.4–7.0)
Neutrophils: 53 %
Platelets: 221 10*3/uL (ref 150–450)
RBC: 4.49 x10E6/uL (ref 4.14–5.80)
RDW: 12.9 % (ref 11.6–15.4)
WBC: 6.7 10*3/uL (ref 3.4–10.8)

## 2019-08-14 LAB — IRON,TIBC AND FERRITIN PANEL
Ferritin: 44 ng/mL (ref 30–400)
Iron Saturation: 19 % (ref 15–55)
Iron: 53 ug/dL (ref 38–169)
Total Iron Binding Capacity: 286 ug/dL (ref 250–450)
UIBC: 233 ug/dL (ref 111–343)

## 2019-08-16 NOTE — Progress Notes (Signed)
Patient ID: Jeremy [name], male   DOB: 07/13/1961, 58 y.o.   MRN: [id number]  Chief Complaint  Patient presents with  . Plantar Fasciitis    F/U Rt PF Pt. states,' was  a lot better, but week 1/2 ago it started getting tender again; 4/10 tenderness -wrose in AM -pt deneis redness/swelling Tx: stretching and aleve    Subjective:  History as above.  Objective:   Constitutional Well developed. Well nourished.  Vascular Dorsalis pedis pulses palpable bilaterally. Posterior tibial pulses palpable bilaterally. Capillary refill normal to all digits. No cyanosis or clubbing noted. Pedal hair growth normal.  Neurologic Normal speech. Oriented to person, place, and time. Epicritic sensation to light touch grossly present bilaterally.  Dermatologic Nails well groomed and normal in appearance. No open wounds. No skin lesions.  Orthopedic: Pain to palpation about the right calcaneal tuber.    Assessment/Plan:  Patient was evaluated and treated and all questions answered.  1.  Plantar fasciitis. - Injection delivered to the right heel.  This is injection #2. - New heel pads dispensed. - Followup should pain persist.  Procedure: Injection Tendon/Ligament Consent: Verbal consent obtained. Location: Right plantar fascia at the glabrous junction; medial approach. Skin Prep: Alcohol. Injectate: 1 cc 0.5% Marcaine plain, 1 cc dexamethasone phosphate, 0.5 cc Kenalog 10. Disposition: Patient tolerated procedure well. Injection site dressed with a band-aid.

## 2019-08-17 NOTE — Telephone Encounter (Signed)
Noted. Make recall for April 2025.

## 2019-08-18 ENCOUNTER — Telehealth: Payer: Self-pay

## 2019-08-18 MED ORDER — DEXILANT 60 MG PO CPDR: 60.0000 mg | DELAYED_RELEASE_CAPSULE | Freq: Every day | ORAL | 1 refills | Status: DC

## 2019-08-18 MED FILL — !DEXILANT DR 60 MG CAPSULE: 60 | 30 days supply | Qty: 30 | Fill #0

## 2019-08-18 NOTE — Telephone Encounter (Signed)
He can stop Aciphex if felt bloated.  Can try Dexilant 60mg  daily before breakfast OR before evening meal, whichever he prefers. RX sent.If not covered, let me know and we can try lansoprazole. He already failed omeprazole and pantoprazole caused side effects.

## 2019-08-18 NOTE — Telephone Encounter (Signed)
Pt said he started the Aciphex and took one Friday morning and one Sat morning.  Both days he felt so bloated all day and was burping so he would have to stop and get his breath good.  He hasn't taken one since and I told him not to until he hears back from [name].  He is aware Neil Crouch, PA had left for the day and I will call him back when she addresses.  He said that is fine.  Magda Paganini, please advise!

## 2019-08-18 NOTE — Telephone Encounter (Signed)
LMOM to call.

## 2019-08-19 NOTE — Telephone Encounter (Signed)
Pt is aware and will try the [place].

## 2019-08-25 ENCOUNTER — Encounter: Payer: Self-pay | Admitting: Internal Medicine

## 2019-08-25 ENCOUNTER — Ambulatory Visit: Payer: Self-pay | Admitting: Internal Medicine

## 2019-08-25 NOTE — Progress Notes (Signed)
Receive note from Dr. Benjamine Mola with [facility] ENT specialty.  Patient was seen in 05/05/2019.  According to his note the patient's ear canals, tympanic membranes and middle ear spaces were all normal.  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  Slight asymmetry was noted on the left at low frequencies.  Strategies of coping with tinnitus which include the use of mask, hearing aids, avoidance of caffeine and alcohol and tinnitus retraining therapy were discussed with the patient.  The small possibility of a caustic neuroma or retrocochlear lesion causing the asymmetric hearing loss and tinnitus were discussed.  Options of conservative observation versus MRI were also discussed.  The patient opted for conservative observation.  He recommended reevaluation in 6 months.

## 2019-08-26 ENCOUNTER — Ambulatory Visit: Payer: Self-pay | Admitting: Internal Medicine

## 2019-08-29 ENCOUNTER — Other Ambulatory Visit: Payer: Self-pay | Admitting: Internal Medicine

## 2019-08-29 MED FILL — TAMSULOSIN HCL 0.4 MG CAP: 0.4 | 30 days supply | Qty: 30 | Fill #1

## 2019-08-29 MED FILL — LISINOPRIL 20 MG TABLET: 20 | 30 days supply | Qty: 30 | Fill #2

## 2019-08-29 MED FILL — glipiZIDE 5 MG TABS: 5 | 30 days supply | Qty: 90 | Fill #5

## 2019-08-29 MED FILL — metFORMIN HCL 1000 MG TABS: 1000 | 30 days supply | Qty: 30 | Fill #6

## 2019-09-05 ENCOUNTER — Encounter: Payer: Self-pay | Admitting: Internal Medicine

## 2019-09-05 ENCOUNTER — Ambulatory Visit: Payer: Self-pay | Attending: Internal Medicine | Admitting: Internal Medicine

## 2019-09-05 ENCOUNTER — Ambulatory Visit (HOSPITAL_BASED_OUTPATIENT_CLINIC_OR_DEPARTMENT_OTHER): Payer: Self-pay | Admitting: Pharmacist

## 2019-09-05 ENCOUNTER — Other Ambulatory Visit: Payer: Self-pay

## 2019-09-05 VITALS — BP 148/86 | HR 88 | Temp 98.4°F | Resp 16 | Wt 233.4 lb

## 2019-09-05 DIAGNOSIS — Z23 Encounter for immunization: Secondary | ICD-10-CM

## 2019-09-05 DIAGNOSIS — K2270 Barrett's esophagus without dysplasia: Secondary | ICD-10-CM

## 2019-09-05 DIAGNOSIS — E119 Type 2 diabetes mellitus without complications: Secondary | ICD-10-CM

## 2019-09-05 DIAGNOSIS — D509 Iron deficiency anemia, unspecified: Secondary | ICD-10-CM

## 2019-09-05 DIAGNOSIS — G4733 Obstructive sleep apnea (adult) (pediatric): Secondary | ICD-10-CM

## 2019-09-05 DIAGNOSIS — I10 Essential (primary) hypertension: Secondary | ICD-10-CM

## 2019-09-05 LAB — POCT GLYCOSYLATED HEMOGLOBIN (HGB A1C): HbA1c, POC (prediabetic range): 6.3 % (ref 5.7–6.4)

## 2019-09-05 LAB — GLUCOSE, POCT (MANUAL RESULT ENTRY): POC Glucose: 135 mg/dl — AB (ref 70–99)

## 2019-09-05 MED ORDER — PANTOPRAZOLE SODIUM 20 MG PO TBEC: 20.0000 mg | DELAYED_RELEASE_TABLET | Freq: Every day | ORAL | 2 refills | Status: DC

## 2019-09-05 MED FILL — rOPINIRole HCL 0.25 MG TABS: 0.25 | 30 days supply | Qty: 30 | Fill #0

## 2019-09-05 MED FILL — PANTOPRAZOLE SOD DR 20 MG T: 20 | 30 days supply | Qty: 30 | Fill #0

## 2019-09-05 NOTE — Progress Notes (Signed)
Patient ID: Jeremy [name], male    DOB: [birth year]  MRN: [id number]  CC: Diabetes and Hypertension   Subjective: Jeremy [name] is a 58 y.o. male who presents for chronic ds management.  His concerns today include:  Pt with hx of DM, HL, HTN,GERD,IDA (Barrett's esophagus and moderate gastritis thought to be NSAID induced on EGD) ,chronic LBP, ED, OSA.  IDA: Saw gastroenterology in follow-up.  Capsule endoscopy recommended to complete work-up.  However patient wanted to put it off until his repeat lab study.  His repeat iron study was okay and at that point he had been out of iron for 3 months.  Patient told to stay off of iron supplement.  We will plan to recheck CBC on his next visit.  He is agreeable to having the capsule endoscopy done if the gastroenterologist still thinks he needs to have it.  The other issue is he has Barrett's esophagus and lifelong PPI recommended.  However he has had problems tolerating several PPIs.  Most recent 1 that he was placed on by GI was Dexilant. PPI causes a lot of gas and burping especially at nights.  Went back to Protonix 40 mg once every 10 days. If take every day it causes significant constipation as well as gas and burping.  HYPERTENSION Currently taking: see medication list Med Adherence: [x] Yes - did not take as yet for today   [] No Medication side effects: [] Yes    [x] No Adherence with salt restriction: [] Yes    [] No Home Monitoring?: [] Yes    [] No Monitoring Frequency: [] Yes    [] No Home BP results range: [] Yes    [] No SOB? [] Yes    [x] No Chest Pain?: [] Yes    [x] No Leg swelling?: [] Yes    [x] No Headaches?: [] Yes    [x] No Dizziness? [] Yes    [x] No Comments:   DIABETES TYPE 2 Last A1C:   Results for orders placed or performed in visit on 09/05/19  Glucose (CBG)  Result Value Ref Range   POC Glucose 135 (A) 70 - 99 mg/dl  HgB A1c  Result Value Ref Range   Hemoglobin A1C     HbA1c POC (<> result, manual entry)      HbA1c, POC (prediabetic range) 6.3 5.7 - 6.4 %   HbA1c, POC (controlled diabetic range)      Med Adherence:  [x] Yes    [] No Medication side effects:  [] Yes    [] No Home Monitoring?  [x] Yes    Once a mh Home glucose results range:115-125 Diet Adherence: [x] Yes    [] No Exercise: [] Yes    [] No Hypoglycemic episodes?: [] Yes    [] No Numbness of the feet? [] Yes    [] No Retinopathy hx? [] Yes    [] No Last eye exam:  Over due for eye exam Comments:   OSA:   Filled out hardship application to get CPAP machine.  Still needed $140 to get it from the company.  Did not have the funds.  His significant other also has sleep apnea and recently got a new machine.  Her old one is still functional.  He plans to clean it and put that to the setting of 19 cm of water pressure and use it..   Patient Active Problem List   Diagnosis Date Noted  .  Barrett's esophagus without dysplasia 05/19/2019  . OSA (obstructive sleep apnea) 04/30/2019  . Iron deficiency anemia 11/25/2018  . Phimosis of penis 11/19/2018  . Esophageal dysphagia 08/19/2018  . Benign prostatic hyperplasia with post-void dribbling 01/31/2018  . Absence of bladder continence 01/31/2018  . Obesity (BMI 30-39.9) 11/05/2017  . GERD (gastroesophageal reflux disease) 06/03/2017  . Chronic pain of both knees 06/03/2017  . Chronic lower back pain 06/03/2017  . Type 2 diabetes mellitus without complication, without long-term current use of insulin ([place]) 04/12/2016  . Essential hypertension 04/12/2016     Current Outpatient Medications on File Prior to Visit  Medication Sig Dispense Refill  . acetaminophen (TYLENOL) 500 MG tablet Take 1,000 mg by mouth 2 (two) times daily.     Marland Kitchen alum & mag hydroxide-simeth (MAALOX/MYLANTA) 200-200-20 MG/5ML suspension Take 15 mLs by mouth every 6 (six) hours as needed for indigestion or heartburn.    . Blood Glucose Monitoring Suppl (TRUE METRIX METER) w/Device KIT 1 each by Does not apply route 4  (four) times daily -  before meals and at bedtime. (Patient taking differently: 1 each by Does not apply route 4 (four) times daily -  before meals and at bedtime. 1-2 times per month) 1 kit 0  . cyclobenzaprine (FLEXERIL) 10 MG tablet Take 1 tablet (10 mg total) by mouth 3 (three) times daily as needed for muscle spasms. 60 tablet 3  . diphenhydrAMINE (BENADRYL) 25 MG tablet Take 50 mg by mouth every 6 (six) hours as needed for itching or allergies (bee stings).     Marland Kitchen glipiZIDE (GLUCOTROL) 5 MG tablet Take 1 tablets PO in the mornings and 1 tablet in the evenings. 60 tablet 6  . glucose blood (TRUE METRIX BLOOD GLUCOSE TEST) test strip Use as instructed 100 each 12  . Liniments (BLUE-EMU SUPER STRENGTH EX) Apply 1 application topically daily as needed (pain).    Marland Kitchen lisinopril (ZESTRIL) 20 MG tablet Take 1 tablet (20 mg total) by mouth daily. 90 tablet 2  . loperamide (IMODIUM A-D) 2 MG tablet Take 2-4 mg by mouth as needed for diarrhea or loose stools.    Marland Kitchen loratadine (CLARITIN) 10 MG tablet Take 10 mg by mouth daily.    . metFORMIN (GLUCOPHAGE) 1000 MG tablet Take 0.5 tablets (500 mg total) by mouth 2 (two) times daily with a meal. 90 tablet 3  . miconazole (MICOTIN) 2 % cream Apply 1 application topically 2 (two) times daily. 28.35 g 0  . naproxen (NAPROSYN) 500 MG tablet TAKE 1 TABLET (500 MG TOTAL) BY MOUTH 2 (TWO) TIMES DAILY WITH A MEAL. 60 tablet 0  . Potassium 99 MG TABS Take 99 mg by mouth daily.    Marland Kitchen rOPINIRole (REQUIP) 0.25 MG tablet TAKE 1 TABLET (0.25 MG TOTAL) BY MOUTH AT BEDTIME. 30 tablet 0  . tamsulosin (FLOMAX) 0.4 MG CAPS capsule TAKE 1 CAPSULE (0.4 MG TOTAL) BY MOUTH DAILY. 30 capsule 3  . TRUEplus Lancets 28G MISC Check blood sugar fasting and before meals and again if pt feels bad (symptoms of hypo). 100 each 12   No current facility-administered medications on file prior to visit.     Allergies  Allergen Reactions  . Cymbalta [Duloxetine Hcl] Other (See Comments)     Caused anorgasmia  . Other Nausea And Vomiting    Mayonnaise   . Robaxin [Methocarbamol] Other (See Comments)    Had hiccups x 4 hours after taking    Social History   Socioeconomic History  .  Marital status: Significant Other    Spouse name: Not on file  . Number of children: Not on file  . Years of education: Not on file  . Highest education level: Not on file  Occupational History  . Occupation: Furniture conservator/restorer  . Financial resource strain: Not on file  . Food insecurity    Worry: Not on file    Inability: Not on file  . Transportation needs    Medical: Not on file    Non-medical: Not on file  Tobacco Use  . Smoking status: Former Smoker    Quit date: 10/16/1988    Years since quitting: 30.9  . Smokeless tobacco: Never Used  Substance and Sexual Activity  . Alcohol use: No  . Drug use: No  . Sexual activity: Not on file  Lifestyle  . Physical activity    Days per week: Not on file    Minutes per session: Not on file  . Stress: Not on file  Relationships  . Social Herbalist on phone: Not on file    Gets together: Not on file    Attends religious service: Not on file    Active member of club or organization: Not on file    Attends meetings of clubs or organizations: Not on file    Relationship status: Not on file  . Intimate partner violence    Fear of current or ex partner: Not on file    Emotionally abused: Not on file    Physically abused: Not on file    Forced sexual activity: Not on file  Other Topics Concern  . Not on file  Social History Narrative   Volunteers as Airline pilot    Family History  Problem Relation Age of Onset  . Pancreatic cancer Mother   . CAD Father   . Colon cancer Paternal Grandfather     Past Surgical History:  Procedure Laterality Date  . BACK SURGERY  1993   lumbar  . BIOPSY  02/07/2019   Procedure: BIOPSY;  Surgeon: Danie Binder, MD;  Location: [facility];  Service: Endoscopy;;  .  CERVICAL SPINE SURGERY  2000  . COLONOSCOPY N/A 02/07/2019   Dr. Oneida Alar: External hemorrhoids next colonoscopy in 10 years  . ESOPHAGOGASTRODUODENOSCOPY N/A 02/07/2019   Dr. Oneida Alar: Barrett's esophagus without dysplasia chronic inactive gastritis but no H. 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 HAND SURGERY      ROS: Review of Systems Negative except as stated above  PHYSICAL EXAM: BP (!) 148/86   Pulse 88   Temp 98.4 F (36.9 C) (Oral)   Resp 16   Wt 233 lb 6.4 oz (105.9 kg)   SpO2 96%   BMI 33.49 kg/m   Physical Exam Constitutional:      Appearance: Normal appearance.     Comments: Obese middle age caucasian male  Neck:     Musculoskeletal: Neck supple.  Cardiovascular:     Rate and Rhythm: Normal rate and regular rhythm.     Pulses: Normal pulses.     Heart sounds: Normal heart sounds.     Comments: No LE edema Pulmonary:     Breath sounds: Normal breath sounds.  Neurological:     Mental Status: He is alert.     CMP Latest Ref Rng & Units 11/19/2018 09/08/2018 07/08/2018  Glucose 65 - 99 mg/dL 102(H) 187(H) -  BUN  6 - 24 mg/dL 15 17 -  Creatinine 0.76 - 1.27 mg/dL 0.74(L) 1.00 -  Sodium 134 - 144 mmol/L 137 134(L) -  Potassium 3.5 - 5.2 mmol/L 4.1 4.2 4.1  Chloride 96 - 106 mmol/L 101 97(L) -  CO2 20 - 29 mmol/L 21 - -  Calcium 8.7 - 10.2 mg/dL 9.7 - -  Total Protein 6.0 - 8.5 g/dL 6.7 - -  Total Bilirubin 0.0 - 1.2 mg/dL <0.2 - -  Alkaline Phos 39 - 117 IU/L 55 - -  AST 0 - 40 IU/L 29 - -  ALT 0 - 44 IU/L 37 - -   Lipid Panel     Component Value Date/Time   CHOL 95 (L) 11/19/2018 1040   TRIG 97 11/19/2018 1040   HDL 43 11/19/2018 1040   CHOLHDL 2.2 11/19/2018 1040   CHOLHDL 4.8 10/17/2016 1012   VLDL NOT CALC 10/17/2016 1012   LDLCALC 33 11/19/2018 1040    CBC    Component Value Date/Time   WBC 6.7 08/13/2019 1617   WBC 6.4 10/17/2016 1012   RBC 4.49  08/13/2019 1617   RBC 5.29 10/17/2016 1012   HGB 13.5 08/13/2019 1617   HCT 40.7 08/13/2019 1617   PLT 221 08/13/2019 1617   MCV 91 08/13/2019 1617   MCH 30.1 08/13/2019 1617   MCH 30.8 10/17/2016 1012   MCHC 33.2 08/13/2019 1617   MCHC 33.7 10/17/2016 1012   RDW 12.9 08/13/2019 1617   LYMPHSABS 2.0 08/13/2019 1617   MONOABS 448 10/17/2016 1012   EOSABS 0.4 08/13/2019 1617   BASOSABS 0.1 08/13/2019 1617   Results for orders placed or performed in visit on 09/05/19  Glucose (CBG)  Result Value Ref Range   POC Glucose 135 (A) 70 - 99 mg/dl  HgB A1c  Result Value Ref Range   Hemoglobin A1C     HbA1c POC (<> result, manual entry)     HbA1c, POC (prediabetic range) 6.3 5.7 - 6.4 %   HbA1c, POC (controlled diabetic range)     ASSESSMENT AND PLAN: 1. Type 2 diabetes mellitus without complication, without long-term current use of insulin (HCC) Diabetes at goal.  Continue current medications.  Continue healthy eating habits.  Try to move more. - Glucose (CBG) - HgB A1c  2. Essential hypertension Not at goal but patient has not taken medicines as yet for today.  He will take as soon as he returns home.  3. Barrett's esophagus without dysplasia I discussed with patient the reasoning for him to be on PPI as Barrett's esophagus puts him at risk for esophageal cancer.  He will try taking half of the 40 mg of Protonix to see if he tolerates it better. - pantoprazole (PROTONIX) 20 MG tablet; Take 1 tablet (20 mg total) by mouth daily.  Dispense: 30 tablet; Refill: 2  4. Iron deficiency anemia, unspecified iron deficiency anemia type Patient agreeable to doing capsule endoscopy if gastroenterologist still thinks he needs to have it done.  His repeat iron level was good off of iron supplement so we will continue to monitor  5. OSA (obstructive sleep apnea) Message will be sent to our case manager to let her know that patient was not able to get his CPAP machine due to cost.  If he does plan  to use his significant other is old device, I have told him to make sure and clean it properly.  Patient was given the opportunity to ask questions.  Patient verbalized understanding  of the plan and was able to repeat key elements of the plan.   Orders Placed This Encounter  Procedures  . Glucose (CBG)  . HgB A1c     Requested Prescriptions   Signed Prescriptions Disp Refills  . pantoprazole (PROTONIX) 20 MG tablet 30 tablet 2    Sig: Take 1 tablet (20 mg total) by mouth daily.    Return in about 4 months (around 01/03/2020).  Karle Plumber, MD, FACP

## 2019-09-05 NOTE — Patient Instructions (Addendum)
Please try to get your eye exam done before the end of the year.  Try taking the Protonix 20 mg daily.  Influenza Virus Vaccine injection (Fluarix) What is this medicine? INFLUENZA VIRUS VACCINE (in floo EN zuh VAHY ruhs vak SEEN) helps to reduce the risk of getting influenza also known as the flu. This medicine may be used for other purposes; ask your health care provider or pharmacist if you have questions. COMMON BRAND NAME(S): Fluarix, Fluzone What should I tell my health care provider before I take this medicine? They need to know if you have any of these conditions:  bleeding disorder like hemophilia  fever or infection  Guillain-Barre syndrome or other neurological problems  immune system problems  infection with the human immunodeficiency virus (HIV) or AIDS  low blood platelet counts  multiple sclerosis  an unusual or allergic reaction to influenza virus vaccine, eggs, chicken proteins, latex, gentamicin, other medicines, foods, dyes or preservatives  pregnant or trying to get pregnant  breast-feeding How should I use this medicine? This vaccine is for injection into a muscle. It is given by a health care professional. A copy of Vaccine Information Statements will be given before each vaccination. Read this sheet carefully each time. The sheet may change frequently. Talk to your pediatrician regarding the use of this medicine in children. Special care may be needed. Overdosage: If you think you have taken too much of this medicine contact a poison control center or emergency room at once. NOTE: This medicine is only for you. Do not share this medicine with others. What if I miss a dose? This does not apply. What may interact with this medicine?  chemotherapy or radiation therapy  medicines that lower your immune system like etanercept, anakinra, infliximab, and adalimumab  medicines that treat or prevent blood clots like warfarin  phenytoin  steroid medicines  like prednisone or cortisone  theophylline  vaccines This list may not describe all possible interactions. Give your health care provider a list of all the medicines, herbs, non-prescription drugs, or dietary supplements you use. Also tell them if you smoke, drink alcohol, or use illegal drugs. Some items may interact with your medicine. What should I watch for while using this medicine? Report any side effects that do not go away within 3 days to your doctor or health care professional. Call your health care provider if any unusual symptoms occur within 6 weeks of receiving this vaccine. You may still catch the flu, but the illness is not usually as bad. You cannot get the flu from the vaccine. The vaccine will not protect against colds or other illnesses that may cause fever. The vaccine is needed every year. What side effects may I notice from receiving this medicine? Side effects that you should report to your doctor or health care professional as soon as possible:  allergic reactions like skin rash, itching or hives, swelling of the face, lips, or tongue Side effects that usually do not require medical attention (report to your doctor or health care professional if they continue or are bothersome):  fever  headache  muscle aches and pains  pain, tenderness, redness, or swelling at site where injected  weak or tired This list may not describe all possible side effects. Call your doctor for medical advice about side effects. You may report side effects to FDA at 1-800-FDA-1088. Where should I keep my medicine? This vaccine is only given in a clinic, pharmacy, doctor's office, or other health care setting and  will not be stored at home. NOTE: This sheet is a summary. It may not cover all possible information. If you have questions about this medicine, talk to your doctor, pharmacist, or health care provider.  2020 Elsevier/Gold Standard (2008-04-29 09:30:40)

## 2019-09-05 NOTE — Progress Notes (Signed)
Patient presents for vaccination against influenza per orders of Dr. Johnson. Consent given. Counseling provided. No contraindications exists. Vaccine administered without incident.   

## 2019-09-08 ENCOUNTER — Telehealth: Payer: Self-pay

## 2019-09-08 ENCOUNTER — Telehealth: Payer: Self-pay | Admitting: Internal Medicine

## 2019-09-08 ENCOUNTER — Telehealth: Payer: Self-pay | Admitting: Gastroenterology

## 2019-09-08 NOTE — Telephone Encounter (Signed)
error 

## 2019-09-08 NOTE — Telephone Encounter (Signed)
Call placed to [facility] [employer] regarding patient's CPAP order. Spoke to [place] who said that the patient was not given a CPAP machine on 06/25/2019. He was approved for hardship program 80% and would owe $142.29.  He told them that he could not afford that and would need to get back to them.    This information was shared with Dr Wynetta Emery.

## 2019-09-08 NOTE — Telephone Encounter (Signed)
-----   Message from Mahala Menghini, PA-C sent at 09/08/2019  8:14 PM EST ----- OK thanks. I will address with Dr. Oneida Alar who originally provided the recommendations back in 01/2019. Now that his IDA has resolved (albeit on iron supplements) it may be something we monitor further.   Magda Paganini ----- Message ----- From: [name], MD Sent: 09/05/2019   6:56 PM EST To: Mahala Menghini, PA-C  I am the PCP for this patient.  He is willing to have the capsule endoscopy if you thinks that he still needs to have it done.  I saw him today and spoke with him about it.

## 2019-09-09 ENCOUNTER — Telehealth: Payer: Self-pay | Admitting: Gastroenterology

## 2019-09-09 NOTE — Telephone Encounter (Signed)
Please reach out to patient and let him know Dr. Oneida Alar recommends he complete capsule study for IDA.   Needs EGD with balloon dilation of duodenal web AND capsule placement for IDA. Conscious sedation but augment with phenergan 12.5mg  IV 30 mins before.    HOLD IRON SEVEN DAYS BEFORE.

## 2019-09-09 NOTE — Telephone Encounter (Signed)
Tried to call pt, no answer, LMOVM for return call.  

## 2019-09-09 NOTE — Telephone Encounter (Signed)
Pt called office, he would like to wait until March 2021 to have procedure. He is aware we will contact him when [facility] March schedule is available.

## 2019-09-09 NOTE — Telephone Encounter (Signed)
-----   Message from [name], MD sent at 09/05/2019  6:56 PM EST ----- I am the PCP for this patient.  He is willing to have the capsule endoscopy if you thinks that he still needs to have it done.  I saw him today and spoke with him about it.

## 2019-09-15 ENCOUNTER — Other Ambulatory Visit: Payer: Self-pay | Admitting: *Deleted

## 2019-09-15 DIAGNOSIS — D509 Iron deficiency anemia, unspecified: Secondary | ICD-10-CM

## 2019-09-15 NOTE — Telephone Encounter (Signed)
Called pt. He has been scheduled for 3/22 at 10:30am. I have also scheduled his covid testing with him for 3/19 at 3:05pm. Aware will mail instructions to him with his covid appt. Orders entered.

## 2019-09-18 ENCOUNTER — Telehealth: Payer: Self-pay

## 2019-09-18 NOTE — Telephone Encounter (Signed)
Attempted to contact the patient # [phone] to inform him that [facility] has funds available to pay for CPAP cost due to [facility].  Message left with call back requested to this CM

## 2019-09-19 ENCOUNTER — Telehealth: Payer: Self-pay | Admitting: Internal Medicine

## 2019-09-19 MED FILL — rOPINIRole HCL 0.25 MG TABS: 0.25 | 30 days supply | Qty: 30 | Fill #0

## 2019-09-19 NOTE — Telephone Encounter (Signed)
Patient called and was infromed that we now have funds to pay for the CPAP

## 2019-09-22 ENCOUNTER — Telehealth: Payer: Self-pay

## 2019-09-22 MED FILL — CYCLOBENZAPRINE 10 MG TAB: 10 | 20 days supply | Qty: 60 | Fill #3

## 2019-09-22 NOTE — Telephone Encounter (Signed)
Call placed to patient and informed him that [facility] will pay for the balance of his CPAP order as he is not able to afford it. He said that he is not working enough to afford the cost. Informed him that [facility] will contact him about the machine set up.  Call placed to [facility], spoke to [place] and made payment for the CPAP machine - $142.29 on behalf of [facility]   email receipt requested. Provided her with this CM's email.  Juliann Pulse said that they would send the receipt when the order is placed to schedule patient set up .

## 2019-09-29 MED FILL — LISINOPRIL 20 MG TABLET: 20 | 30 days supply | Qty: 30 | Fill #3

## 2019-09-29 MED FILL — TAMSULOSIN HCL 0.4 MG CAP: 0.4 | 30 days supply | Qty: 30 | Fill #2

## 2019-10-15 ENCOUNTER — Telehealth: Payer: Self-pay | Admitting: Internal Medicine

## 2019-10-15 NOTE — Telephone Encounter (Signed)
Pt called since he still have problem with the CPAP machine, since the company that was advice to call was sold and is a new company, so he need to know what to do now, please follow up

## 2019-10-15 NOTE — Telephone Encounter (Signed)
Call returned to the patient. He explained that he has not heard from anyone about his CPAP machine. He call [employer] in [place] as well as their corporate office and was told that they have no record of his order. He said that he understands that [employer] was bought by [employer] but he would like to know the status of his order. Informed him that this CM would check with [place] as the order was placed months ago and payment was made for the machine on 09/22/2019.  Call placed to [facility] to check on status of the order. She requested that this CM send her community message with this information and should would check on it.  Message sent to [facility] with information as requested.

## 2019-10-21 ENCOUNTER — Encounter: Payer: Self-pay | Admitting: Internal Medicine

## 2019-10-21 ENCOUNTER — Other Ambulatory Visit: Payer: Self-pay | Admitting: Internal Medicine

## 2019-10-21 MED FILL — TAMSULOSIN HCL 0.4 MG CAP: 0.4 | 30 days supply | Qty: 30 | Fill #2

## 2019-10-21 MED FILL — LISINOPRIL 20 MG TABLET: 20 | 30 days supply | Qty: 30 | Fill #3

## 2019-10-22 MED ORDER — ROPINIROLE HCL 0.5 MG PO TABS: 0.5000 mg | ORAL_TABLET | Freq: Every day | ORAL | 4 refills | Status: DC

## 2019-10-22 MED FILL — rOPINIRole HCL 0.5 MG TABS: 0.5 | 30 days supply | Qty: 30 | Fill #0

## 2019-10-24 ENCOUNTER — Telehealth: Payer: Self-pay

## 2019-10-24 MED FILL — metFORMIN HCL 1000 MG TABS: 1000 | 30 days supply | Qty: 30 | Fill #7

## 2019-10-24 MED FILL — glipiZIDE 5 MG TABS: 5 | 30 days supply | Qty: 90 | Fill #6

## 2019-10-24 NOTE — Telephone Encounter (Addendum)
Opened in error

## 2019-10-27 ENCOUNTER — Telehealth: Payer: Self-pay

## 2019-10-27 NOTE — Telephone Encounter (Signed)
Call received from the patient requesting an update on the status of CPAP order. Informed him that [employer] is aware of the issue. Henderson Newcomer has been notified and another message will be sent to her today requesting an update on the order.    Message sent to [facility] as noted.

## 2019-10-28 ENCOUNTER — Encounter: Payer: Self-pay | Admitting: Internal Medicine

## 2019-10-28 DIAGNOSIS — N3943 Post-void dribbling: Secondary | ICD-10-CM

## 2019-10-29 ENCOUNTER — Telehealth: Payer: Self-pay

## 2019-10-29 MED ORDER — TAMSULOSIN HCL 0.4 MG PO CAPS: 0.8000 mg | ORAL_CAPSULE | Freq: Every day | ORAL | 3 refills | Status: DC

## 2019-10-29 NOTE — Telephone Encounter (Signed)
As recommendation of Melissa Stenson/[employer], SECURE email sent to [employer] regarding the status of the hardship application for patient's CPAP machine. Her [employer], Lupita Leash oversees the hardship program

## 2019-11-07 ENCOUNTER — Other Ambulatory Visit: Payer: Self-pay | Admitting: Internal Medicine

## 2019-11-07 MED FILL — TAMSULOSIN HCL 0.4 MG CAP: 0.4 | 30 days supply | Qty: 60 | Fill #0

## 2019-11-11 ENCOUNTER — Telehealth: Payer: Self-pay | Admitting: Internal Medicine

## 2019-11-14 ENCOUNTER — Telehealth: Payer: Self-pay | Admitting: Internal Medicine

## 2019-11-14 NOTE — Telephone Encounter (Signed)
Patient calling in regards to CPAP machine. Please contact patient in regards to update.

## 2019-11-17 NOTE — Telephone Encounter (Signed)
Call returned to the patient and informed him that this CM has been in touch with [employer] and they are still working on trying to resolve the issue of his hardship application for his CPAP machine.

## 2019-11-20 MED FILL — rOPINIRole HCL 0.5 MG TABS: 0.5 | 30 days supply | Qty: 30 | Fill #1

## 2019-11-20 MED FILL — LISINOPRIL 20 MG TABLET: 20 | 30 days supply | Qty: 30 | Fill #4

## 2019-11-26 ENCOUNTER — Encounter: Payer: Self-pay | Admitting: Internal Medicine

## 2019-12-03 ENCOUNTER — Other Ambulatory Visit: Payer: Self-pay | Admitting: Internal Medicine

## 2019-12-03 DIAGNOSIS — E119 Type 2 diabetes mellitus without complications: Secondary | ICD-10-CM

## 2019-12-03 MED FILL — metFORMIN HCL 1000 MG TABS: 1000 | 30 days supply | Qty: 30 | Fill #0

## 2019-12-03 MED FILL — ?GLIPIZIDE 5MG TABLET: 5 | 30 days supply | Qty: 60 | Fill #0

## 2019-12-10 ENCOUNTER — Other Ambulatory Visit: Payer: Self-pay | Admitting: Internal Medicine

## 2019-12-10 DIAGNOSIS — E119 Type 2 diabetes mellitus without complications: Secondary | ICD-10-CM

## 2019-12-10 MED FILL — ?GLIPIZIDE 5MG TABLET: 5 | 30 days supply | Qty: 60 | Fill #1

## 2019-12-10 MED FILL — TAMSULOSIN HCL 0.4 MG CAP: 0.4 | 30 days supply | Qty: 60 | Fill #1

## 2019-12-10 MED FILL — rOPINIRole HCL 0.5 MG TABS: 0.5 | 30 days supply | Qty: 30 | Fill #2

## 2019-12-12 ENCOUNTER — Other Ambulatory Visit: Payer: Self-pay

## 2019-12-12 ENCOUNTER — Encounter: Payer: Self-pay | Admitting: Internal Medicine

## 2019-12-12 ENCOUNTER — Ambulatory Visit: Payer: Self-pay | Attending: Internal Medicine | Admitting: Internal Medicine

## 2019-12-12 ENCOUNTER — Telehealth: Payer: Self-pay | Admitting: Internal Medicine

## 2019-12-12 VITALS — BP 137/79 | HR 91 | Temp 97.7°F | Resp 16 | Wt 233.6 lb

## 2019-12-12 DIAGNOSIS — T464X5A Adverse effect of angiotensin-converting-enzyme inhibitors, initial encounter: Secondary | ICD-10-CM

## 2019-12-12 DIAGNOSIS — I10 Essential (primary) hypertension: Secondary | ICD-10-CM

## 2019-12-12 DIAGNOSIS — R05 Cough: Secondary | ICD-10-CM

## 2019-12-12 DIAGNOSIS — G4733 Obstructive sleep apnea (adult) (pediatric): Secondary | ICD-10-CM

## 2019-12-12 MED ORDER — LOSARTAN POTASSIUM 25 MG PO TABS: 25.0000 mg | ORAL_TABLET | Freq: Every day | ORAL | 1 refills | Status: DC

## 2019-12-12 MED FILL — metFORMIN HCL 1000 MG TABS: 1000 | 30 days supply | Qty: 30 | Fill #0

## 2019-12-12 MED FILL — LOSARTAN POTASSIUM 25 MG TA: 25 | 30 days supply | Qty: 30 | Fill #0

## 2019-12-12 NOTE — Telephone Encounter (Signed)
Patient called and requested for Face to face notes regarding him getting a cpap and a printed script for the cpap machine. Please follow up at your earliest convenience. Patient requested for a call when ready for pick up.

## 2019-12-12 NOTE — Telephone Encounter (Signed)
Pt is seeing pcp for a face to face for cpap

## 2019-12-12 NOTE — Patient Instructions (Signed)
Check your blood pressure at least twice a week.  The goal is 130/80 or lower. Stop lisinopril.  We will use more Losartan 25 mg instead

## 2019-12-12 NOTE — Progress Notes (Signed)
Patient ID: Jeremy [name], male    DOB: 12/21/1960  MRN: [id number]  CC: cpap   Subjective: Jeremy [name] is a 59 y.o. male who presents for face-to-face visit for medical equipment/CPAP His concerns today include:  Pt with hx of DM, HL, HTN,GERD,IDA(Barrett's esophagus and moderate gastritis thought to be NSAID induced on EGD),chronic LBP, ED, OSA.  Patient has been working with our case manager trying to get CPAP machine since being diagnosed with sleep apnea in August of last year.   Patient states he spoke with [facility] today and was told that they need a new prescription from [name] and hardcopy of face-to-face note.   Patient endorses morning headaches, loud snoring, witnessed apnea through the night by his significant other and daytime sleepiness.  Patient also complains of dry cough which he thinks is due to the lisinopril.  States that he has had it for a while but he finally figured out that his the medication that is causing it.   He checks blood pressure occasionally.  Most recent blood pressure at home had systolic blood pressure around 140. Patient Active Problem List   Diagnosis Date Noted  . Barrett's esophagus without dysplasia 05/19/2019  . OSA (obstructive sleep apnea) 04/30/2019  . Iron deficiency anemia 11/25/2018  . Phimosis of penis 11/19/2018  . Esophageal dysphagia 08/19/2018  . Benign prostatic hyperplasia with post-void dribbling 01/31/2018  . Absence of bladder continence 01/31/2018  . Obesity (BMI 30-39.9) 11/05/2017  . GERD (gastroesophageal reflux disease) 06/03/2017  . Chronic pain of both knees 06/03/2017  . Chronic lower back pain 06/03/2017  . Type 2 diabetes mellitus without complication, without long-term current use of insulin ([place]) 04/12/2016  . Essential hypertension 04/12/2016     Current Outpatient Medications on File Prior to Visit  Medication Sig Dispense Refill  . acetaminophen (TYLENOL) 500 MG tablet Take 1,000 mg by mouth 2 (two) times  daily.     Marland Kitchen alum & mag hydroxide-simeth (MAALOX/MYLANTA) 200-200-20 MG/5ML suspension Take 15 mLs by mouth every 6 (six) hours as needed for indigestion or heartburn.    . Blood Glucose Monitoring Suppl (TRUE METRIX METER) w/Device KIT 1 each by Does not apply route 4 (four) times daily -  before meals and at bedtime. (Patient taking differently: 1 each by Does not apply route 4 (four) times daily -  before meals and at bedtime. 1-2 times per month) 1 kit 0  . cyclobenzaprine (FLEXERIL) 10 MG tablet Take 1 tablet (10 mg total) by mouth 3 (three) times daily as needed for muscle spasms. 60 tablet 3  . diphenhydrAMINE (BENADRYL) 25 MG tablet Take 50 mg by mouth every 6 (six) hours as needed for itching or allergies (bee stings).     Marland Kitchen glipiZIDE (GLUCOTROL) 5 MG tablet Take 1 tablets PO in the mornings and 1 tablet in the evenings. 60 tablet 6  . glucose blood (TRUE METRIX BLOOD GLUCOSE TEST) test strip Use as instructed 100 each 12  . Liniments (BLUE-EMU SUPER STRENGTH EX) Apply 1 application topically daily as needed (pain).    Marland Kitchen lisinopril (ZESTRIL) 20 MG tablet Take 1 tablet (20 mg total) by mouth daily. 90 tablet 2  . loperamide (IMODIUM A-D) 2 MG tablet Take 2-4 mg by mouth as needed for diarrhea or loose stools.    Marland Kitchen loratadine (CLARITIN) 10 MG tablet Take 10 mg by mouth daily.    . metFORMIN (GLUCOPHAGE) 1000 MG tablet TAKE 1/2 TABLET (500 MG TOTAL) BY MOUTH 2 (  TWO) TIMES DAILY WITH A MEAL. 30 tablet 0  . miconazole (MICOTIN) 2 % cream Apply 1 application topically 2 (two) times daily. 28.35 g 0  . naproxen (NAPROSYN) 500 MG tablet TAKE 1 TABLET (500 MG TOTAL) BY MOUTH 2 (TWO) TIMES DAILY WITH A MEAL. 60 tablet 0  . pantoprazole (PROTONIX) 20 MG tablet Take 1 tablet (20 mg total) by mouth daily. 30 tablet 2  . Potassium 99 MG TABS Take 99 mg by mouth daily.    Marland Kitchen rOPINIRole (REQUIP) 0.5 MG tablet Take 1 tablet (0.5 mg total) by mouth at bedtime. 30 tablet 4  . tamsulosin (FLOMAX) 0.4 MG CAPS  capsule Take 2 capsules (0.8 mg total) by mouth daily. 60 capsule 3  . TRUEplus Lancets 28G MISC Check blood sugar fasting and before meals and again if pt feels bad (symptoms of hypo). 100 each 12   No current facility-administered medications on file prior to visit.    Allergies  Allergen Reactions  . Cymbalta [Duloxetine Hcl] Other (See Comments)    Caused anorgasmia  . Other Nausea And Vomiting    Mayonnaise   . Robaxin [Methocarbamol] Other (See Comments)    Had hiccups x 4 hours after taking    Social History   Socioeconomic History  . Marital status: Significant Other    Spouse name: Not on file  . Number of children: Not on file  . Years of education: Not on file  . Highest education level: Not on file  Occupational History  . Occupation: Clinical biochemist  Tobacco Use  . Smoking status: Former Smoker    Quit date: 10/16/1988    Years since quitting: 31.1  . Smokeless tobacco: Never Used  Substance and Sexual Activity  . Alcohol use: No  . Drug use: No  . Sexual activity: Not on file  Other Topics Concern  . Not on file  Social History Narrative   Volunteers as Airline pilot   Social Determinants of Health   Financial Resource Strain:   . Difficulty of Paying Living Expenses: Not on file  Food Insecurity:   . Worried About Charity fundraiser in the Last Year: Not on file  . Ran Out of Food in the Last Year: Not on file  Transportation Needs:   . Lack of Transportation (Medical): Not on file  . Lack of Transportation (Non-Medical): Not on file  Physical Activity:   . Days of Exercise per Week: Not on file  . Minutes of Exercise per Session: Not on file  Stress:   . Feeling of Stress : Not on file  Social Connections:   . Frequency of Communication with Friends and Family: Not on file  . Frequency of Social Gatherings with Friends and Family: Not on file  . Attends Religious Services: Not on file  . Active Member of Clubs or Organizations: Not on file    . Attends Archivist Meetings: Not on file  . Marital Status: Not on file  Intimate Partner Violence:   . Fear of Current or Ex-Partner: Not on file  . Emotionally Abused: Not on file  . Physically Abused: Not on file  . Sexually Abused: Not on file    Family History  Problem Relation Age of Onset  . Pancreatic cancer Mother   . CAD Father   . Colon cancer Paternal Grandfather     Past Surgical History:  Procedure Laterality Date  . BACK SURGERY  1993   lumbar  . BIOPSY  02/07/2019   Procedure: BIOPSY;  Surgeon: Danie Binder, MD;  Location: [facility];  Service: Endoscopy;;  . CERVICAL SPINE SURGERY  2000  . COLONOSCOPY N/A 02/07/2019   Dr. Oneida Alar: External hemorrhoids next colonoscopy in 10 years  . ESOPHAGOGASTRODUODENOSCOPY N/A 02/07/2019   Dr. Oneida Alar: Barrett's esophagus without dysplasia chronic inactive gastritis but no H. 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 HAND SURGERY      ROS: Review of Systems Negative except as stated above  PHYSICAL EXAM: BP 137/79   Pulse 91   Temp 97.7 F (36.5 C)   Resp 16   Wt 233 lb 9.6 oz (106 kg)   SpO2 99%   BMI 33.52 kg/m   Physical Exam   General appearance - alert, well appearing, middle-age Caucasian male and in no distress Mental status - normal mood, behavior, speech, dress, motor activity, and thought processes Chest - clear to auscultation, no wheezes, rales or rhonchi, symmetric air entry Heart - normal rate, regular rhythm, normal S1, S2, no murmurs, rubs, clicks or gallops  CMP Latest Ref Rng & Units 11/19/2018 09/08/2018 07/08/2018  Glucose 65 - 99 mg/dL 102(H) 187(H) -  BUN 6 - 24 mg/dL 15 17 -  Creatinine 0.76 - 1.27 mg/dL 0.74(L) 1.00 -  Sodium 134 - 144 mmol/L 137 134(L) -  Potassium 3.5 - 5.2 mmol/L 4.1 4.2 4.1  Chloride 96 - 106 mmol/L 101 97(L) -  CO2 20 - 29 mmol/L 21 - -  Calcium 8.7 - 10.2  mg/dL 9.7 - -  Total Protein 6.0 - 8.5 g/dL 6.7 - -  Total Bilirubin 0.0 - 1.2 mg/dL <0.2 - -  Alkaline Phos 39 - 117 IU/L 55 - -  AST 0 - 40 IU/L 29 - -  ALT 0 - 44 IU/L 37 - -   Lipid Panel     Component Value Date/Time   CHOL 95 (L) 11/19/2018 1040   TRIG 97 11/19/2018 1040   HDL 43 11/19/2018 1040   CHOLHDL 2.2 11/19/2018 1040   CHOLHDL 4.8 10/17/2016 1012   VLDL NOT CALC 10/17/2016 1012   LDLCALC 33 11/19/2018 1040    CBC    Component Value Date/Time   WBC 6.7 08/13/2019 1617   WBC 6.4 10/17/2016 1012   RBC 4.49 08/13/2019 1617   RBC 5.29 10/17/2016 1012   HGB 13.5 08/13/2019 1617   HCT 40.7 08/13/2019 1617   PLT 221 08/13/2019 1617   MCV 91 08/13/2019 1617   MCH 30.1 08/13/2019 1617   MCH 30.8 10/17/2016 1012   MCHC 33.2 08/13/2019 1617   MCHC 33.7 10/17/2016 1012   RDW 12.9 08/13/2019 1617   LYMPHSABS 2.0 08/13/2019 1617   MONOABS 448 10/17/2016 1012   EOSABS 0.4 08/13/2019 1617   BASOSABS 0.1 08/13/2019 1617    ASSESSMENT AND PLAN: 1. OSA (obstructive sleep apnea) Written prescription for CPAP was given to patient. I will have our caseworker fax a copy of my note to them next week. Patient has sleep apnea by history and also by sleep study done 05/2019 and would benefit from having the CPAP machine.  2. Cough due to ACE inhibitor Stop lisinopril.  Changed to Cozaar instead  3. Essential hypertension Advised to check blood pressure at least twice a week with goal being 130/80 or lower.  If he is running higher than that he will let me know so that we can titrate  the Cozaar. - losartan (COZAAR) 25 MG tablet; Take 1 tablet (25 mg total) by mouth daily.  Dispense: 90 tablet; Refill: 1     Patient was given the opportunity to ask questions.  Patient verbalized understanding of the plan and was able to repeat key elements of the plan.   No orders of the defined types were placed in this encounter.    Requested Prescriptions    No prescriptions  requested or ordered in this encounter    No follow-ups on file.  Karle Plumber, MD, FACP

## 2019-12-15 ENCOUNTER — Telehealth: Payer: Self-pay

## 2019-12-15 NOTE — Telephone Encounter (Signed)
This CM spoke to [facility] regarding the CPAP order. She state that she has been in touch with their [facility] office and they have received the new CPAP prescription and the documentation that is needed to process the order.  She noted that the patient stated that [facility] is paying for the machine.  This CM explained that he had been approved for the hardship program and that this CM spoke to [facility] on 09/21/2020 and made a credit card payment on behalf of [facility] for $142.29 and was told that a receipt would be emailed to this CM  when the order is placed for patient set up.  Dois Davenport said that [employer] office has been in touch with the patient/

## 2019-12-17 ENCOUNTER — Telehealth: Payer: Self-pay

## 2019-12-17 NOTE — Telephone Encounter (Signed)
Call received today from [facility].  She confirmed that they have spoken to him a couple of times in the past few days. They have the prescription needed for the CPAP and did not need any additional information.  She said that the patient has been approved for hardship program at 80%.  She will have a staff member contact this CM for payment for the remaining 20% - a one time payment approximately $140.  She explained that they did not process the payment that this CM made in the past. She said that the payment would be requested when they are ready to schedule pick up of the CPAP and that process to schedule pick up may take up to a week.   Andrey Campanile can be reached at [email or web address]

## 2019-12-25 ENCOUNTER — Telehealth: Payer: Self-pay | Admitting: Gastroenterology

## 2019-12-25 NOTE — Telephone Encounter (Signed)
Error

## 2020-01-02 ENCOUNTER — Other Ambulatory Visit: Payer: Self-pay

## 2020-01-02 ENCOUNTER — Other Ambulatory Visit (HOSPITAL_COMMUNITY)
Admission: RE | Admit: 2020-01-02 | Discharge: 2020-01-02 | Disposition: A | Discharge status: Home / Self Care | Payer: Self-pay | Source: Ambulatory Visit | Attending: Gastroenterology | Admitting: Gastroenterology

## 2020-01-02 ENCOUNTER — Telehealth: Payer: Self-pay

## 2020-01-02 DIAGNOSIS — Z20822 Contact with and (suspected) exposure to covid-19: Secondary | ICD-10-CM | POA: Insufficient documentation

## 2020-01-02 DIAGNOSIS — Z01812 Encounter for preprocedural laboratory examination: Secondary | ICD-10-CM | POA: Insufficient documentation

## 2020-01-02 NOTE — Telephone Encounter (Signed)
Call placed to the patient to inquire about the status of his CPAP machine.  He said that he was supposed to pick it up today but forgot what time  He had to go for a COVID test this morning for an upcoming procedure.  He said that he has the phone number for [facility] and will call them to reschedule pick up.

## 2020-01-03 ENCOUNTER — Encounter: Payer: Self-pay | Admitting: Gastroenterology

## 2020-01-03 ENCOUNTER — Encounter: Payer: Self-pay | Admitting: Internal Medicine

## 2020-01-03 LAB — SARS CORONAVIRUS 2 (TAT 6-24 HRS): SARS Coronavirus 2: NEGATIVE

## 2020-01-05 ENCOUNTER — Telehealth: Payer: Self-pay | Admitting: Gastroenterology

## 2020-01-05 ENCOUNTER — Telehealth: Payer: Self-pay

## 2020-01-05 ENCOUNTER — Ambulatory Visit (HOSPITAL_COMMUNITY)
Admission: RE | Admit: 2020-01-05 | Discharge: 2020-01-05 | Disposition: A | Discharge status: Home / Self Care | Payer: Self-pay | Attending: Gastroenterology | Admitting: Gastroenterology

## 2020-01-05 ENCOUNTER — Other Ambulatory Visit: Payer: Self-pay

## 2020-01-05 ENCOUNTER — Encounter (HOSPITAL_COMMUNITY)
Admission: RE | Disposition: A | Discharge status: Home / Self Care | Payer: Self-pay | Source: Home / Self Care | Attending: Gastroenterology

## 2020-01-05 ENCOUNTER — Other Ambulatory Visit: Payer: Self-pay | Admitting: Internal Medicine

## 2020-01-05 ENCOUNTER — Encounter (HOSPITAL_COMMUNITY): Payer: Self-pay | Admitting: Gastroenterology

## 2020-01-05 DIAGNOSIS — K2270 Barrett's esophagus without dysplasia: Secondary | ICD-10-CM

## 2020-01-05 DIAGNOSIS — R1110 Vomiting, unspecified: Secondary | ICD-10-CM

## 2020-01-05 DIAGNOSIS — K311 Adult hypertrophic pyloric stenosis: Secondary | ICD-10-CM

## 2020-01-05 DIAGNOSIS — D509 Iron deficiency anemia, unspecified: Secondary | ICD-10-CM | POA: Insufficient documentation

## 2020-01-05 DIAGNOSIS — Z7984 Long term (current) use of oral hypoglycemic drugs: Secondary | ICD-10-CM | POA: Insufficient documentation

## 2020-01-05 DIAGNOSIS — Z800 Family history of malignant neoplasm of digestive organs: Secondary | ICD-10-CM | POA: Insufficient documentation

## 2020-01-05 DIAGNOSIS — K315 Obstruction of duodenum: Secondary | ICD-10-CM

## 2020-01-05 DIAGNOSIS — Z87891 Personal history of nicotine dependence: Secondary | ICD-10-CM | POA: Insufficient documentation

## 2020-01-05 DIAGNOSIS — Q438 Other specified congenital malformations of intestine: Secondary | ICD-10-CM

## 2020-01-05 DIAGNOSIS — Q400 Congenital hypertrophic pyloric stenosis: Secondary | ICD-10-CM

## 2020-01-05 DIAGNOSIS — I10 Essential (primary) hypertension: Secondary | ICD-10-CM | POA: Insufficient documentation

## 2020-01-05 DIAGNOSIS — Z8249 Family history of ischemic heart disease and other diseases of the circulatory system: Secondary | ICD-10-CM | POA: Insufficient documentation

## 2020-01-05 DIAGNOSIS — E785 Hyperlipidemia, unspecified: Secondary | ICD-10-CM | POA: Insufficient documentation

## 2020-01-05 DIAGNOSIS — K219 Gastro-esophageal reflux disease without esophagitis: Secondary | ICD-10-CM | POA: Insufficient documentation

## 2020-01-05 DIAGNOSIS — T39395A Adverse effect of other nonsteroidal anti-inflammatory drugs [NSAID], initial encounter: Secondary | ICD-10-CM | POA: Insufficient documentation

## 2020-01-05 DIAGNOSIS — K2960 Other gastritis without bleeding: Secondary | ICD-10-CM | POA: Insufficient documentation

## 2020-01-05 DIAGNOSIS — E119 Type 2 diabetes mellitus without complications: Secondary | ICD-10-CM | POA: Insufficient documentation

## 2020-01-05 DIAGNOSIS — Z79899 Other long term (current) drug therapy: Secondary | ICD-10-CM | POA: Insufficient documentation

## 2020-01-05 DIAGNOSIS — Z888 Allergy status to other drugs, medicaments and biological substances status: Secondary | ICD-10-CM | POA: Insufficient documentation

## 2020-01-05 HISTORY — PX: ESOPHAGOGASTRODUODENOSCOPY: SHX5428

## 2020-01-05 HISTORY — PX: SAVORY DILATION: SHX5439

## 2020-01-05 LAB — CBC WITH DIFFERENTIAL/PLATELET
Abs Immature Granulocytes: 0.04 10*3/uL (ref 0.00–0.07)
Basophils Absolute: 0.1 10*3/uL (ref 0.0–0.1)
Basophils Relative: 1 %
Eosinophils Absolute: 0.3 10*3/uL (ref 0.0–0.5)
Eosinophils Relative: 5 %
HCT: 38.2 % — ABNORMAL LOW (ref 39.0–52.0)
Hemoglobin: 12.2 g/dL — ABNORMAL LOW (ref 13.0–17.0)
Immature Granulocytes: 1 %
Lymphocytes Relative: 26 %
Lymphs Abs: 1.4 10*3/uL (ref 0.7–4.0)
MCH: 29 pg (ref 26.0–34.0)
MCHC: 31.9 g/dL (ref 30.0–36.0)
MCV: 91 fL (ref 80.0–100.0)
Monocytes Absolute: 0.5 10*3/uL (ref 0.1–1.0)
Monocytes Relative: 9 %
Neutro Abs: 3.3 10*3/uL (ref 1.7–7.7)
Neutrophils Relative %: 58 %
Platelets: 196 10*3/uL (ref 150–400)
RBC: 4.2 MIL/uL — ABNORMAL LOW (ref 4.22–5.81)
RDW: 13.2 % (ref 11.5–15.5)
WBC: 5.6 10*3/uL (ref 4.0–10.5)
nRBC: 0 % (ref 0.0–0.2)

## 2020-01-05 LAB — GLUCOSE, CAPILLARY: Glucose-Capillary: 123 mg/dL — ABNORMAL HIGH (ref 70–99)

## 2020-01-05 LAB — FERRITIN: Ferritin: 13 ng/mL — ABNORMAL LOW (ref 24–336)

## 2020-01-05 SURGERY — EGD (ESOPHAGOGASTRODUODENOSCOPY)
Anesthesia: Moderate Sedation

## 2020-01-05 MED ORDER — SODIUM CHLORIDE 0.9 % IV SOLN: INTRAVENOUS | Status: DC

## 2020-01-05 MED ORDER — FERROUS SULFATE 325 (65 FE) MG PO TABS: 325.0000 mg | ORAL_TABLET | Freq: Every day | ORAL | 1 refills | Status: DC

## 2020-01-05 MED ORDER — LIDOCAINE VISCOUS HCL 2 % MT SOLN
OROMUCOSAL | Status: AC
  Filled 2020-01-05: qty 15

## 2020-01-05 MED ORDER — PROMETHAZINE HCL 25 MG/ML IJ SOLN
INTRAMUSCULAR | Status: AC
  Administered 2020-01-05: 12.5 mg
  Filled 2020-01-05: qty 1

## 2020-01-05 MED ORDER — MINERAL OIL PO OIL
TOPICAL_OIL | ORAL | Status: AC
  Filled 2020-01-05: qty 30

## 2020-01-05 MED ORDER — MIDAZOLAM HCL 5 MG/5ML IJ SOLN
INTRAMUSCULAR | Status: AC
  Filled 2020-01-05: qty 10

## 2020-01-05 MED ORDER — SODIUM CHLORIDE FLUSH 0.9 % IV SOLN
INTRAVENOUS | Status: AC
  Filled 2020-01-05: qty 10

## 2020-01-05 MED ORDER — LIDOCAINE VISCOUS HCL 2 % MT SOLN
OROMUCOSAL | Status: DC | PRN
  Administered 2020-01-05: 1 via OROMUCOSAL

## 2020-01-05 MED ORDER — PROMETHAZINE HCL 25 MG/ML IJ SOLN: 12.5000 mg | Freq: Once | INTRAMUSCULAR | Status: DC

## 2020-01-05 MED ORDER — MIDAZOLAM HCL 5 MG/5ML IJ SOLN
INTRAMUSCULAR | Status: DC | PRN
  Administered 2020-01-05: 2 mg via INTRAVENOUS
  Administered 2020-01-05: 1 mg via INTRAVENOUS
  Administered 2020-01-05: 2 mg via INTRAVENOUS

## 2020-01-05 MED ORDER — MEPERIDINE HCL 100 MG/ML IJ SOLN
INTRAMUSCULAR | Status: AC
  Filled 2020-01-05: qty 2

## 2020-01-05 MED ORDER — MEPERIDINE HCL 100 MG/ML IJ SOLN
INTRAMUSCULAR | Status: DC | PRN
  Administered 2020-01-05 (×3): 25 mg via INTRAVENOUS

## 2020-01-05 NOTE — Discharge Instructions (Signed)
I STRETCHED your PYLORUS AND THE SMALL BOWEL. I COULD PASS CAPSULE THROUGH YOU PYLORUS BUT NOT THE DUODENAL WEB. THE WEB IS DUE TO YOUR NSAID USE. You have BARRETT'S ESOPHAGUS AND  MILD gastritis.   DRINK WATER TO KEEP YOUR URINE LIGHT YELLOW.  FOLLOW A LOW FAT DIET. SEE INFO BELOW.   CONSIDER AN ALTERNATIVE TO NAPROXEN. TRY TO USE TYLENOL INSTEAD.  CONTINUE PROTONIX. TAKE 30 MINUTES PRIOR TO BREAKFAST.   SEE SURGERY(DR. BRIDGES OR JENKINS) TO ADDRESS YOUR BOWEL OBSTRUCTION.  I WILL CHECK YOUR BLOOD COUNT AND IRON STORES TODAY.  FOLLOW UP APPT IN 6 MONTHS.    UPPER ENDOSCOPY AFTER CARE Read the instructions outlined below and refer to this sheet in the next week. These discharge instructions provide you with general information on caring for yourself after you leave the hospital. While your treatment has been planned according to the most current medical practices available, unavoidable complications occasionally occur. If you have any problems or questions after discharge, call DR. FIELDS, [phone].  ACTIVITY  You may resume your regular activity, but move at a slower pace for the next 24 hours.   Take frequent rest periods for the next 24 hours.   Walking will help get rid of the air and reduce the bloated feeling in your belly (abdomen).   No driving for 24 hours (because of the medicine (anesthesia) used during the test).   You may shower.   Do not sign any important legal documents or operate any machinery for 24 hours (because of the anesthesia used during the test).    NUTRITION  Drink plenty of fluids.   You may resume your normal diet as instructed by your doctor.   Begin with a light meal and progress to your normal diet. Heavy or fried foods are harder to digest and may make you feel sick to your stomach (nauseated).   Avoid alcoholic beverages for 24 hours or as instructed.    MEDICATIONS  You may resume your normal medications.   WHAT YOU CAN  EXPECT TODAY  Some feelings of bloating in the abdomen.   Passage of more gas than usual.    IF YOU HAD A BIOPSY TAKEN DURING THE UPPER ENDOSCOPY:  Eat a soft diet IF YOU HAVE NAUSEA, BLOATING, ABDOMINAL PAIN, OR VOMITING.    FINDING OUT THE RESULTS OF YOUR TEST Not all test results are available during your visit. DR. Oneida Alar WILL CALL YOU WITHIN 14 DAYS OF YOUR PROCEDUE WITH YOUR RESULTS. Do not assume everything is normal if you have not heard from DR. FIELDS IN 14 DAYS, CALL HER OFFICE AT [phone].  SEEK IMMEDIATE MEDICAL ATTENTION AND CALL THE OFFICE: [phone] IF:  You have more than a spotting of blood in your stool.   Your belly is swollen (abdominal distention).   You are nauseated or vomiting.   You have a temperature over 101F.   You have abdominal pain or discomfort that is severe or gets worse throughout the day.   Pyloric Stenosis/DUODENAL WEB   Pyloric stenosis means that the opening (pylorus) out of the stomach is narrowed (stenosis). The narrowing is caused by an increase in the muscles of the pylorus. A DUODENAL WEB means that the opening OF THE SMALL BOWEL is narrowed (stenosis). The narrowing is caused by NSAIDs.  This blocks food from passing out of the stomach AND the small bowel.    Gastritis  Gastritis is an inflammation (the body's way of reacting to injury and/or infection)  of the stomach. It is often caused by bacterial (germ) infections. It can also be caused BY ASPIRIN, BC/GOODY POWDER'S, (IBUPROFEN) MOTRIN, OR ALEVE (NAPROXEN), chemicals (including alcohol), SPICY FOODS, and medications. This illness may be associated with generalized malaise (feeling tired, not well), UPPER ABDOMINAL STOMACH cramps, and fever. One common bacterial cause of gastritis is an organism known as H. Pylori. This can be treated with antibiotics.    REFLUX REGURGITATION can be caused by stomach acid OR FOOD backing up into the tube that carries food from the mouth  down to the stomach (lower esophagus).  TREATMENT There are a number of medicines used to treat reflux, including: Antacids.  PEPCID OR TAGAMET Proton-pump inhibitors: PROTONIX  HOME CARE INSTRUCTIONS Eat 2-3 hours before going to bed.  Try to reach and maintain a healthy weight.  Do not eat just a few very large meals. Instead, eat 4 TO 6 smaller meals throughout the day.  Try to identify foods and beverages that make your symptoms worse, and avoid these.  Avoid tight clothing.  Do not exercise right after eating.

## 2020-01-05 NOTE — Telephone Encounter (Signed)
REFER RO SURGERY [place], Dx: DUODENAL OBSTRUCTION/POSTPRANDIAL VOMITING.

## 2020-01-05 NOTE — Addendum Note (Signed)
Addended by: Armstead Peaks on: 01/05/2020 12:14 PM   Modules accepted: Orders

## 2020-01-05 NOTE — H&P (Addendum)
Primary Care Physician:  [name], MD Primary Gastroenterologist:  Dr. Oneida Alar  Pre-Procedure History & Physical: HPI:  Jeremy [name] is a 59 y.o. male here for IRON DEFICIENCY ANEMIA.  Past Medical History:  Diagnosis Date  . Diabetes mellitus without complication ([place])   . GERD (gastroesophageal reflux disease)   . Hyperlipidemia   . Hypertension   . IDA (iron deficiency anemia)   . Rash of entire body 03/2016    Past Surgical History:  Procedure Laterality Date  . BACK SURGERY  1993   lumbar  . BIOPSY  02/07/2019   Procedure: BIOPSY;  Surgeon: Danie Binder, MD;  Location: [facility];  Service: Endoscopy;;  . CERVICAL SPINE SURGERY  2000  . COLONOSCOPY N/A 02/07/2019   Dr. Oneida Alar: External hemorrhoids next colonoscopy in 10 years  . ESOPHAGOGASTRODUODENOSCOPY N/A 02/07/2019   Dr. Oneida Alar: Barrett's esophagus without dysplasia chronic inactive gastritis but no H. 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 HAND SURGERY      Prior to Admission medications   Medication Sig Start Date End Date Taking? Authorizing Provider  acetaminophen (TYLENOL) 500 MG tablet Take 500-1,000 mg by mouth See admin instructions. 1000 mg in the morning and 500 mg in the evening   Yes [provider]  bismuth subsalicylate (PEPTO BISMOL) 262 MG/15ML suspension Take 30 mLs by mouth every 6 (six) hours as needed for indigestion.   Yes [provider]  Blood Glucose Monitoring Suppl (TRUE METRIX METER) w/Device KIT 1 each by Does not apply route 4 (four) times daily -  before meals and at bedtime. Patient taking differently: 1 each by Does not apply route 4 (four) times daily -  before meals and at bedtime. 1-2 times per month 04/12/16  Yes Elsie Stain, MD  diphenhydrAMINE (BENADRYL) 25 MG tablet Take 50 mg by mouth every 6 (six) hours as needed for itching or allergies (bee  stings).    Yes [provider]  glipiZIDE (GLUCOTROL) 5 MG tablet Take 1 tablets PO in the mornings and 1 tablet in the evenings. Patient taking differently: Take 5 mg by mouth in the morning and at bedtime.  02/10/19  Yes [name], MD  glucose blood (TRUE METRIX BLOOD GLUCOSE TEST) test strip Use as instructed 02/10/19  Yes [name], MD  Liniments (BLUE-EMU SUPER STRENGTH EX) Apply 1 application topically daily as needed (pain).   Yes [provider]  loperamide (IMODIUM A-D) 2 MG tablet Take 2-4 mg by mouth as needed for diarrhea or loose stools.   Yes [provider]  loratadine (CLARITIN) 10 MG tablet Take 10 mg by mouth daily.   Yes [provider]  losartan (COZAAR) 25 MG tablet Take 1 tablet (25 mg total) by mouth daily. 12/12/19  Yes [name], MD  metFORMIN (GLUCOPHAGE) 1000 MG tablet TAKE 1/2 TABLET (500 MG TOTAL) BY MOUTH 2 (TWO) TIMES DAILY WITH A MEAL. Patient taking differently: Take 500 mg by mouth daily.  12/10/19  Yes [name], MD  naproxen (NAPROSYN) 500 MG tablet TAKE 1 TABLET (500 MG TOTAL) BY MOUTH 2 (TWO) TIMES DAILY WITH A MEAL. Patient taking differently: Take 440 mg by mouth 2 (two) times daily with a meal.  08/04/19  Yes [name], MD  pantoprazole (PROTONIX) 20 MG tablet Take 1 tablet (20 mg total) by mouth daily. Patient taking differently: Take 40  mg by mouth every other day.  09/05/19  Yes [name], MD  Potassium 99 MG TABS Take 99 mg by mouth in the morning and at bedtime.    Yes [provider]  rOPINIRole (REQUIP) 0.5 MG tablet Take 1 tablet (0.5 mg total) by mouth at bedtime. 10/22/19  Yes [name], MD  tamsulosin (FLOMAX) 0.4 MG CAPS capsule Take 2 capsules (0.8 mg total) by mouth daily. 10/29/19  Yes [name], MD  TRUEplus Lancets 28G MISC Check blood sugar fasting and before meals and again if pt feels bad (symptoms of hypo). 02/10/19  Yes [name], MD  cyclobenzaprine (FLEXERIL) 10 MG tablet Take 1 tablet (10 mg total) by mouth 3 (three) times daily as needed for muscle spasms. Patient not taking: Reported on 12/30/2019 06/02/19   Hilts, Legrand Como, MD  miconazole (MICOTIN) 2 % cream Apply 1 application topically 2 (two) times daily. Patient not taking: Reported on 12/30/2019 04/30/19   [name], MD    Allergies as of 09/15/2019 - Review Complete 09/05/2019  Allergen Reaction Noted  . Cymbalta [duloxetine hcl] Other (See Comments) 06/05/2017  . Other Nausea And Vomiting 01/24/2019  . Robaxin [methocarbamol] Other (See Comments) 06/02/2019    Family History  Problem Relation Age of Onset  . Pancreatic cancer Mother   . CAD Father   . Colon cancer Paternal Grandfather     Social History   Socioeconomic History  . Marital status: Significant Other    Spouse name: Not on file  . Number of children: Not on file  . Years of education: Not on file  . Highest education level: Not on file  Occupational History  . Occupation: Clinical biochemist  Tobacco Use  . Smoking status: Former Smoker    Quit date: 10/16/1988    Years since quitting: 31.2  . Smokeless tobacco: Never Used  Substance and Sexual Activity  . Alcohol use: No  . Drug use: No  . Sexual activity: Not on file  Other Topics Concern  . Not on file  Social History Narrative   Volunteers as Airline pilot   Social Determinants of Radio broadcast assistant Strain:   . Difficulty of Paying Living Expenses:   Food Insecurity:   . Worried About Charity fundraiser in the Last Year:   . Arboriculturist in the Last Year:   Transportation Needs:   . Film/video editor (Medical):   Marland Kitchen Lack of Transportation (Non-Medical):   Physical Activity:   . Days of Exercise per Week:   . Minutes of Exercise per Session:   Stress:   . Feeling of Stress :   Social Connections:   . Frequency of Communication with Friends and Family:   . Frequency of Social  Gatherings with Friends and Family:   . Attends Religious Services:   . Active Member of Clubs or Organizations:   . Attends Archivist Meetings:   Marland Kitchen Marital Status:   Intimate Partner Violence:   . Fear of Current or Ex-Partner:   . Emotionally Abused:   Marland Kitchen Physically Abused:   . Sexually Abused:     Review of Systems: See HPI, otherwise negative ROS   Physical Exam: BP (!) 142/65   Pulse 61   Temp 97.9 F (36.6 C) (Oral)   Resp (!) 22   SpO2 100%  General:   Alert,  pleasant and cooperative in NAD Head:  Normocephalic and atraumatic. Neck:  Supple; Lungs:  Clear throughout to auscultation.    Heart:  Regular rate and rhythm. Abdomen:  Soft, nontender and nondistended. Normal bowel sounds, without guarding, and without rebound.   Neurologic:  Alert and  oriented x4;  grossly normal neurologically.  Impression/Plan:    IRON DEFICIENCY ANEMIA.  PLAN:  1. EGD/dil to place givens capsule TODAY.  DISCUSSED PROCEDURE, BENEFITS, & RISKS: < 1% chance of medication reaction, bleeding, perforation, CAPSULE RETENTION, or ASPIRATION.

## 2020-01-05 NOTE — Op Note (Signed)
[facility] Patient Name: Jeremy SohoGary [name] Procedure Date: 01/05/2020 10:16 AM MRN: [id number] Date of Birth: 03/23/1961 Attending MD: Jonette EvaSandi Taniah Reinecke MD, MD CSN: [id number] Age: 59 Admit Type: Outpatient Procedure:                Upper GI endoscopy WITH BALLOON DILATION Indications:              Suspected upper gastrointestinal bleeding in                            patient with unexplained iron deficiency anemia,                            Iron deficiency anemia with no gastrointestinal                            bleeding source identified during previous                            colonoscopy Providers:                Jonette EvaSandi [name] Googe MD, MD, Armando Gangracy Greene, RN, Burke Keelsrisann                            Tilley, Technician Referring MD:             Marcine Matareborah B. Johnson Medicines:                Meperidine 75 mg IV, Midazolam 5 mg IV,                            Promethazine 12.5 mg IV Complications:            No immediate complications. Estimated Blood Loss:     Estimated blood loss was minimal. Procedure:                Pre-Anesthesia Assessment:                           - Prior to the procedure, a History and Physical                            was performed, and patient medications and                            allergies were reviewed. The patient's tolerance of                            previous anesthesia was also reviewed. The risks                            and benefits of the procedure and the sedation                            options and risks were discussed with the patient.  All questions were answered, and informed consent                            was obtained. Prior Anticoagulants: The patient has                            taken no previous anticoagulant or antiplatelet                            agents except for NSAID medication. ASA Grade                            Assessment: II - A patient with mild systemic                            disease.  After reviewing the risks and benefits,                            the patient was deemed in satisfactory condition to                            undergo the procedure. After obtaining informed                            consent, the endoscope was passed under direct                            vision. Throughout the procedure, the patient's                            blood pressure, pulse, and oxygen saturations were                            monitored continuously. The GIF-H190 ([id number])                            scope was introduced through the mouth, and                            advanced to the second part of duodenum. The upper                            GI endoscopy was somewhat difficult due to the                            patient's agitation. The patient tolerated the                            procedure fairly well. Scope In: 10:52:16 AM Scope Out: 11:16:30 AM Total Procedure Duration: 0 hours 24 minutes 14 seconds  Findings:      Barrett's esophagus was present in the mid esophagus and in the distal       esophagus.      Localized mild inflammation characterized by  congestion (edema) and       erythema was found in the gastric antrum.      A benign-appearing, intrinsic moderate stenosis was found at the       pylorus. This was traversed. A TTS dilator was passed through the scope.       Dilation with a 12 mm pyloric balloon dilator was performed. The       dilation site was examined following endoscope reinsertion and showed       mild mucosal disruption. ABLE TO PASS GIVENS CAPSULE INTO DUODENAL BULB.      SMALL AMOUNT OF RETAINED FOOD IN DUIODENAL BULB. An acquired       benign-appearing, intrinsic severe stenosis was found AT D1/D2and was       traversed. The dilation site was examined following endoscope       reinsertion and showed moderate mucosal disruption and mild improvement       in luminal narrowing. IN SPITE OF ADEQUATE DILATION WHEN BALLOON IS       PRESENT,  UNABLE TO PASS GIVENS CAPSULE THROUGH WEB. Impression:               - Barrett's esophagus.                           - MILD NSAID Gastritis.                           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                            Dilated.                           - Acquired duodenal WEB WITH OBSTRUCTION.                           - UNABLE TO PASS GIVENS CAPSULE INTO SMALL BOWEL Moderate Sedation:      Moderate (conscious) sedation was administered by the endoscopy nurse       and supervised by the endoscopist. The following parameters were       monitored: oxygen saturation, heart rate, blood pressure, and response       to care. Total physician intraservice time was 38 minutes. Recommendation:           - Patient has a contact number available for                            emergencies. The signs and symptoms of potential                            delayed complications were discussed with the                            patient. Return to normal activities tomorrow.                            Written discharge instructions were provided to the  patient.                           - Resume previous diet.                           - Continue present medications. CHECK CBC/FERRITIN                            TODAY.                           - Return to GI office in 6 months. Procedure Code(s):        --- Professional ---                           [phone], Esophagogastroduodenoscopy, flexible,                            transoral; with dilation of gastric/duodenal                            stricture(s) (eg, balloon, bougie)                           99153, Moderate sedation; each additional 15                            minutes intraservice time                           99153, Moderate sedation; each additional 15                            minutes intraservice time                           G0500, Moderate sedation services provided by the                             same physician or other qualified health care                            professional performing a gastrointestinal                            endoscopic service that sedation supports,                            requiring the presence of an independent trained                            observer to assist in the monitoring of the                            patient's level of consciousness and physiological  status; initial 15 minutes of intra-service time;                            patient age 71 years or older (additional time may                            be reported with [phone], as appropriate) Diagnosis Code(s):        --- Professional ---                           K22.70, Barrett's esophagus without dysplasia                           K29.70, Gastritis, unspecified, without bleeding                           K31.1, Adult hypertrophic pyloric stenosis                           K31.5, Obstruction of duodenum                           D50.9, Iron deficiency anemia, unspecified CPT copyright 2019 [employer] Medical Association. All rights reserved. The codes documented in this report are preliminary and upon coder review may  be revised to meet current compliance requirements. Jeremy Drain, MD Jeremy Drain MD, MD 01/05/2020 11:50:36 AM This report has been signed electronically. Number of Addenda: 0

## 2020-01-05 NOTE — Telephone Encounter (Signed)
Message received from [facility]/ [facility] # [phone] stating that the patient was a no call/no show for pick up of his CPAP.   A balance of $ 142.29 is owed. He requested a call back.

## 2020-01-05 NOTE — Telephone Encounter (Signed)
Referral sent 

## 2020-01-06 MED FILL — FERROUS SULFATE 325 MG TAB: 325 (65 FE) | 30 days supply | Qty: 30 | Fill #0

## 2020-01-06 NOTE — Telephone Encounter (Signed)
Call received from [facility] Supply-Intake Specialist&scheduler.  He said that he just received a call from the patient and he is now scheduled for CPAP pick up tomorrow 01/07/2020 in [place] office of [facility].  Credit card information given to Mr Clair Gulling to pay $142.29 for patient's CPAP machine.  This is a one time charge.  This CM requested a receipt.  He said that he will request an email receipt be sent to this CM after the patient picks up the machine.  The card will not be charged until the machine is picked up.  He confirmed this CM email address. Marland Kitchen

## 2020-01-06 NOTE — Telephone Encounter (Signed)
Another message received today from [facility] noting that he called the patient about the no call/ no show to pick up his CPAP and the patient hung up on him. This CM returned call to [facility] # [phone], there was no option to transfer to his extension [phone].  Spoke to [place] who transferred the call and this CM left message for him  with call back # [phone].   This CM spoke with the patient 01/02/2020 and instructed him to call [facility] about the CPAP machine.   [facility] was to pay the balance of what he owed $142.29 but he needs to call to schedule the pick up.

## 2020-01-11 ENCOUNTER — Encounter: Payer: Self-pay | Admitting: Internal Medicine

## 2020-01-11 MED ORDER — AMLODIPINE BESYLATE 5 MG PO TABS: 5.0000 mg | ORAL_TABLET | Freq: Every day | ORAL | 3 refills | Status: DC

## 2020-01-12 MED ORDER — LISINOPRIL 20 MG PO TABS: 20.0000 mg | ORAL_TABLET | Freq: Every day | ORAL | 3 refills | Status: DC

## 2020-01-12 MED FILL — LISINOPRIL 20 MG TABLET: 20 | 30 days supply | Qty: 30 | Fill #0

## 2020-01-12 NOTE — Addendum Note (Signed)
Addended by: Jonah Blue B on: 01/12/2020 04:00 PM   Modules accepted: Orders

## 2020-01-13 NOTE — Telephone Encounter (Signed)
error 

## 2020-01-13 NOTE — Telephone Encounter (Signed)
Call placed to [facility] to confirm that the patient picked up his CPAP machine.  A receipt for payment has not been received.  Message left with call back requested to this CM # [phone]

## 2020-01-14 MED FILL — PANTOPRAZOLE SOD DR 40 MG T: 40 | 30 days supply | Qty: 60 | Fill #0

## 2020-01-14 MED FILL — ?GLIPIZIDE 5MG TABLET: 5 | 30 days supply | Qty: 60 | Fill #2

## 2020-01-15 ENCOUNTER — Encounter: Payer: Self-pay | Admitting: General Surgery

## 2020-01-15 ENCOUNTER — Ambulatory Visit (INDEPENDENT_AMBULATORY_CARE_PROVIDER_SITE_OTHER): Payer: Self-pay | Admitting: General Surgery

## 2020-01-15 ENCOUNTER — Other Ambulatory Visit: Payer: Self-pay

## 2020-01-15 VITALS — BP 147/75 | HR 62 | Temp 97.5°F | Resp 12 | Ht 69.0 in | Wt 236.0 lb

## 2020-01-15 DIAGNOSIS — K311 Adult hypertrophic pyloric stenosis: Secondary | ICD-10-CM

## 2020-01-15 NOTE — Progress Notes (Signed)
Jeremy [name]; [id number]; 1961/04/08   HPI Patient is a 59 year old white male who was referred to my care by Dr. Wynetta Emery and Dr. Oneida Alar of GI for evaluation treatment of gastric outlet obstruction secondary to pyloric stenosis.  Patient recently underwent an EGD and was noted to have a duodenal obstruction that appeared to be benign in nature.  Patient was told that it was due to with nonsteroidal anti-inflammatory use over the many years.  He also is a diabetic.  He states that he is still eating regular foods and only intermittently has episodes of heartburn and emesis.  He has not lost any weight recently.  He states he is better since his EGD with dilatation.  He is on PPI which he adjusts on his own.  He currently has no abdominal pain. Past Medical History:  Diagnosis Date  . Diabetes mellitus without complication ([place])   . GERD (gastroesophageal reflux disease)   . Hyperlipidemia   . Hypertension   . IDA (iron deficiency anemia)   . Rash of entire body 03/2016    Past Surgical History:  Procedure Laterality Date  . BACK SURGERY  1993   lumbar  . BIOPSY  02/07/2019   Procedure: BIOPSY;  Surgeon: Danie Binder, MD;  Location: [facility];  Service: Endoscopy;;  . CERVICAL SPINE SURGERY  2000  . COLONOSCOPY N/A 02/07/2019   Dr. Oneida Alar: External hemorrhoids next colonoscopy in 10 years  . ESOPHAGOGASTRODUODENOSCOPY N/A 02/07/2019   Dr. Oneida Alar: Barrett's esophagus without dysplasia chronic inactive gastritis but no H. 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 ESOPHAGOGASTRODUODENOSCOPY N/A 01/05/2020   Procedure: ESOPHAGOGASTRODUODENOSCOPY (EGD);  Surgeon: Danie Binder, MD;  Location: [facility];  Service: Endoscopy;  Laterality: N/A;  10:30am  . HAND SURGERY    . SAVORY DILATION N/A 01/05/2020   Procedure: SAVORY DILATION;  Surgeon: Danie Binder, MD;  Location: [facility];   Service: Endoscopy;  Laterality: N/A;    Family History  Problem Relation Age of Onset  . Pancreatic cancer Mother   . CAD Father   . Colon cancer Paternal Grandfather     Current Outpatient Medications on File Prior to Visit  Medication Sig Dispense Refill  . acetaminophen (TYLENOL) 500 MG tablet Take 500-1,000 mg by mouth See admin instructions. 1000 mg in the morning and 500 mg in the evening    . bismuth subsalicylate (PEPTO BISMOL) 262 MG/15ML suspension Take 30 mLs by mouth every 6 (six) hours as needed for indigestion.    . Blood Glucose Monitoring Suppl (TRUE METRIX METER) w/Device KIT 1 each by Does not apply route 4 (four) times daily -  before meals and at bedtime. (Patient taking differently: 1 each by Does not apply route 4 (four) times daily -  before meals and at bedtime. 1-2 times per month) 1 kit 0  . diphenhydrAMINE (BENADRYL) 25 MG tablet Take 50 mg by mouth every 6 (six) hours as needed for itching or allergies (bee stings).     . ferrous sulfate 325 (65 FE) MG tablet Take 1 tablet (325 mg total) by mouth daily with breakfast. 100 tablet 1  . glipiZIDE (GLUCOTROL) 5 MG tablet Take 1 tablets PO in the mornings and 1 tablet in the evenings. (Patient taking differently: Take 5 mg by mouth in the morning and at bedtime. ) 60 tablet 6  . glucose blood (TRUE METRIX BLOOD GLUCOSE TEST) test  strip Use as instructed 100 each 12  . Liniments (BLUE-EMU SUPER STRENGTH EX) Apply 1 application topically daily as needed (pain).    Marland Kitchen lisinopril (ZESTRIL) 20 MG tablet Take 1 tablet (20 mg total) by mouth daily. 90 tablet 3  . loperamide (IMODIUM A-D) 2 MG tablet Take 2-4 mg by mouth as needed for diarrhea or loose stools.    Marland Kitchen loratadine (CLARITIN) 10 MG tablet Take 10 mg by mouth daily.    . metFORMIN (GLUCOPHAGE) 1000 MG tablet TAKE 1/2 TABLET (500 MG TOTAL) BY MOUTH 2 (TWO) TIMES DAILY WITH A MEAL. (Patient taking differently: Take 500 mg by mouth daily. ) 30 tablet 0  . naproxen  (NAPROSYN) 500 MG tablet TAKE 1 TABLET (500 MG TOTAL) BY MOUTH 2 (TWO) TIMES DAILY WITH A MEAL. (Patient taking differently: Take 440 mg by mouth 2 (two) times daily with a meal. ) 60 tablet 0  . pantoprazole (PROTONIX) 20 MG tablet Take 1 tablet (20 mg total) by mouth daily. (Patient taking differently: Take 40 mg by mouth every other day. ) 30 tablet 2  . Potassium 99 MG TABS Take 99 mg by mouth in the morning and at bedtime.     Marland Kitchen rOPINIRole (REQUIP) 0.5 MG tablet Take 1 tablet (0.5 mg total) by mouth at bedtime. 30 tablet 4  . tamsulosin (FLOMAX) 0.4 MG CAPS capsule Take 2 capsules (0.8 mg total) by mouth daily. 60 capsule 3  . TRUEplus Lancets 28G MISC Check blood sugar fasting and before meals and again if pt feels bad (symptoms of hypo). 100 each 12   No current facility-administered medications on file prior to visit.    Allergies  Allergen Reactions  . Cymbalta [Duloxetine Hcl] Other (See Comments)    Caused anorgasmia  . Lisinopril Cough  . Other Nausea And Vomiting    Mayonnaise mustard ketchup  . Robaxin [Methocarbamol] Other (See Comments)    Had hiccups x 4 hours after taking    Social History   Substance and Sexual Activity  Alcohol Use No    Social History   Tobacco Use  Smoking Status Former Smoker  . Quit date: 10/16/1988  . Years since quitting: 31.2  Smokeless Tobacco Never Used    Review of Systems  Constitutional: Negative.   HENT: Positive for sinus pain and sore throat.   Eyes: Negative.   Respiratory: Positive for cough and shortness of breath.   Cardiovascular: Negative.   Gastrointestinal: Positive for abdominal pain, heartburn, nausea and vomiting.  Genitourinary: Negative.   Musculoskeletal: Positive for back pain, joint pain and neck pain.  Skin: Negative.   Neurological: Positive for dizziness and tingling.  Endo/Heme/Allergies: Negative.   Psychiatric/Behavioral: Negative.     Objective   Vitals:   01/15/20 1106  BP: (!) 147/75   Pulse: 62  Resp: 12  Temp: (!) 97.5 F (36.4 C)  SpO2: 97%    Physical Exam Vitals reviewed.  Constitutional:      Appearance: Normal appearance. He is obese. He is not ill-appearing.  HENT:     Head: Normocephalic and atraumatic.  Cardiovascular:     Rate and Rhythm: Normal rate and regular rhythm.     Heart sounds: Normal heart sounds. No murmur. No friction rub. No gallop.   Pulmonary:     Effort: Pulmonary effort is normal. No respiratory distress.     Breath sounds: Normal breath sounds. No stridor. No wheezing, rhonchi or rales.  Abdominal:     General: Bowel sounds  are normal. There is no distension.     Palpations: Abdomen is soft. There is no mass.     Tenderness: There is no abdominal tenderness. There is no guarding or rebound.     Hernia: No hernia is present.  Skin:    General: Skin is warm and dry.  Neurological:     Mental Status: He is alert and oriented to person, place, and time.    EGD report reviewed Assessment  Partial gastric outlet obstruction secondary to duodenal/pyloric stenosis, benign Clinically, the patient reports that he is doing better rPlan   At this point, patient does not require urgent gastrojejunostomy.  Patient is very hesitant on surgery and does not want surgery unless absolutely necessary.  He was given strict instructions on the signs and symptoms of worsening gastric outlet obstruction and malnutrition.  He will contact me should he worsen.  Follow up with me expectantly.  The risks and benefits of the surgery including bleeding, infection, and anastomotic leak were fully explained to the patient.

## 2020-01-20 ENCOUNTER — Encounter: Payer: Self-pay | Admitting: Internal Medicine

## 2020-01-21 ENCOUNTER — Telehealth: Payer: Self-pay

## 2020-01-21 MED FILL — metFORMIN HCL 1000 MG TABS: 1000 | 30 days supply | Qty: 30 | Fill #0

## 2020-01-21 MED FILL — rOPINIRole HCL 0.5 MG TABS: 0.5 | 30 days supply | Qty: 30 | Fill #3

## 2020-01-21 NOTE — Telephone Encounter (Signed)
Call placed to [facility] to confirm that the patient picked up his CPAP machine.  A receipt for payment has not been received.  Message left with call back requested to this CM # [phone].

## 2020-01-21 NOTE — Telephone Encounter (Signed)
Call received from [facility].  He confirmed that the patient picked up his CPAP machine.  Informed him that this CM has not received a receipt as requested.  He stated that he would contact the representative who set the patient up with the CPAP and request a receipt again.  He confirmed this CM email address.

## 2020-02-04 MED FILL — TAMSULOSIN HCL 0.4 MG CAP: 0.4 | 30 days supply | Qty: 60 | Fill #2

## 2020-02-05 ENCOUNTER — Encounter: Payer: Self-pay | Admitting: Internal Medicine

## 2020-02-05 ENCOUNTER — Other Ambulatory Visit: Payer: Self-pay

## 2020-02-05 ENCOUNTER — Ambulatory Visit: Payer: Self-pay | Attending: Internal Medicine | Admitting: Internal Medicine

## 2020-02-05 VITALS — BP 130/62 | HR 68 | Temp 98.1°F | Resp 16 | Wt 237.4 lb

## 2020-02-05 DIAGNOSIS — K311 Adult hypertrophic pyloric stenosis: Secondary | ICD-10-CM

## 2020-02-05 DIAGNOSIS — D509 Iron deficiency anemia, unspecified: Secondary | ICD-10-CM

## 2020-02-05 DIAGNOSIS — F1193 Opioid use, unspecified with withdrawal: Secondary | ICD-10-CM | POA: Insufficient documentation

## 2020-02-05 DIAGNOSIS — R358 Other polyuria: Secondary | ICD-10-CM

## 2020-02-05 DIAGNOSIS — I10 Essential (primary) hypertension: Secondary | ICD-10-CM

## 2020-02-05 DIAGNOSIS — Z7189 Other specified counseling: Secondary | ICD-10-CM

## 2020-02-05 DIAGNOSIS — F1123 Opioid dependence with withdrawal: Secondary | ICD-10-CM

## 2020-02-05 DIAGNOSIS — G4733 Obstructive sleep apnea (adult) (pediatric): Secondary | ICD-10-CM

## 2020-02-05 DIAGNOSIS — E119 Type 2 diabetes mellitus without complications: Secondary | ICD-10-CM

## 2020-02-05 DIAGNOSIS — R3589 Other polyuria: Secondary | ICD-10-CM

## 2020-02-05 LAB — GLUCOSE, POCT (MANUAL RESULT ENTRY): POC Glucose: 122 mg/dl — AB (ref 70–99)

## 2020-02-05 NOTE — Progress Notes (Signed)
Patient ID: Jeremy [name], male    DOB: [birth year]  MRN: [id number]  CC: Diabetes and Hypertension   Subjective: Ruven [name] is a 59 y.o. male who presents for chronic ds management His concerns today include:  Pt with hx of DM, HL, HTN,GERD,IDA(Barrett's esophagus and moderate gastritis thought to be NSAID induced on EGD),chronic LBP, ED, OSA, RLS.  HTN: On last visit we had changed lisinopril to Cozaar due to cough.  However patient sent me a MyChart message requesting to be changed back to lisinopril.  He states that the Cozaar was causing problems with him being able to maintain erection and was causing a wet irritating cough.  He preferred to go back to the lisinopril until with a dry cough.  I try changing him to amlodipine instead but patient insisted that he wanted to be back on the lisinopril.  Problems keeping erection went away after going back to Lisinopril -He limits salt in the foods.  No device to check blood pressure.  Iron deficiency anemia: Saw the gastroenterologist Dr. Dayton Scrape last month.  She did EGD and patient was found to have pyloric stenosis.  She did dilation of the pylorus during the procedure.  She referred him to the surgeon Dr. Aviva Signs to be considered for surgery.  However patient does not want to pursue surgical option at this time because he feels he is doing okay and has not had any major weight changes.  He states that the only time his stomach bothers him is if he eats too much at one time.   -Recent ferritin level was 13 and he has developed a mild anemia again with H&H of 12.2/38 previously 13.5/40.7.  I had sent patient a my chart message recommending that he restart iron and take it daily.  However patient is not able to tolerate taking it daily.  He states that it makes his stools too sticky and thick.  In the past he was able to take it 3-4 times a week and did okay with that.  OSA: He finally got the CPAP machine.  He states that he is doing  okay with it except he wonders whether the mass may be too small because sometimes he wake up in the middle of the night feeling air leaks.  The other major thing is that he wakes up in the morning feeling bloated as if air was pushed into the stomach.  Current pressure setting is at 19 cm of water pressure.    DM:  Not checking BS regularly Compliant with Metformine and Glipizide.  However he is supposed to be on Metformin 1 g taking half a tablet twice a day.  However he tells me that he takes half a tab only once a day because when he takes the other half in the evening he wakes up in the morning feeling very spent.  He feels he does okay with his eating habits.   -Complains of some frequent urination and hurting in his kidneys at times when he urinates.  No dysuria.  HM: He has decided not to get COVID-19 vaccination.  He feels the vaccine was developed too quickly.     Patient Active Problem List   Diagnosis Date Noted  . Congenital hypertrophic pyloric stenosis   . Duodenal web   . Barrett's esophagus without dysplasia 05/19/2019  . OSA (obstructive sleep apnea) 04/30/2019  . Iron deficiency anemia 11/25/2018  . Phimosis of penis 11/19/2018  . Esophageal dysphagia 08/19/2018  .  Benign prostatic hyperplasia with post-void dribbling 01/31/2018  . Absence of bladder continence 01/31/2018  . Obesity (BMI 30-39.9) 11/05/2017  . GERD (gastroesophageal reflux disease) 06/03/2017  . Chronic pain of both knees 06/03/2017  . Chronic lower back pain 06/03/2017  . Type 2 diabetes mellitus without complication, without long-term current use of insulin ([place]) 04/12/2016  . Essential hypertension 04/12/2016     Current Outpatient Medications on File Prior to Visit  Medication Sig Dispense Refill  . acetaminophen (TYLENOL) 500 MG tablet Take 500-1,000 mg by mouth See admin instructions. 1000 mg in the morning and 500 mg in the evening    . bismuth subsalicylate (PEPTO BISMOL) 262 MG/15ML  suspension Take 30 mLs by mouth every 6 (six) hours as needed for indigestion.    . Blood Glucose Monitoring Suppl (TRUE METRIX METER) w/Device KIT 1 each by Does not apply route 4 (four) times daily -  before meals and at bedtime. (Patient taking differently: 1 each by Does not apply route 4 (four) times daily -  before meals and at bedtime. 1-2 times per month) 1 kit 0  . diphenhydrAMINE (BENADRYL) 25 MG tablet Take 50 mg by mouth every 6 (six) hours as needed for itching or allergies (bee stings).     . ferrous sulfate 325 (65 FE) MG tablet Take 1 tablet (325 mg total) by mouth daily with breakfast. 100 tablet 1  . glipiZIDE (GLUCOTROL) 5 MG tablet Take 1 tablets PO in the mornings and 1 tablet in the evenings. (Patient taking differently: Take 5 mg by mouth in the morning and at bedtime. ) 60 tablet 6  . glucose blood (TRUE METRIX BLOOD GLUCOSE TEST) test strip Use as instructed 100 each 12  . Liniments (BLUE-EMU SUPER STRENGTH EX) Apply 1 application topically daily as needed (pain).    Marland Kitchen lisinopril (ZESTRIL) 20 MG tablet Take 1 tablet (20 mg total) by mouth daily. 90 tablet 3  . loperamide (IMODIUM A-D) 2 MG tablet Take 2-4 mg by mouth as needed for diarrhea or loose stools.    Marland Kitchen loratadine (CLARITIN) 10 MG tablet Take 10 mg by mouth daily.    . metFORMIN (GLUCOPHAGE) 1000 MG tablet TAKE 1/2 TABLET (500 MG TOTAL) BY MOUTH 2 (TWO) TIMES DAILY WITH A MEAL. (Patient taking differently: Take 500 mg by mouth daily. ) 30 tablet 0  . naproxen (NAPROSYN) 500 MG tablet TAKE 1 TABLET (500 MG TOTAL) BY MOUTH 2 (TWO) TIMES DAILY WITH A MEAL. (Patient taking differently: Take 440 mg by mouth 2 (two) times daily with a meal. ) 60 tablet 0  . pantoprazole (PROTONIX) 20 MG tablet Take 1 tablet (20 mg total) by mouth daily. (Patient taking differently: Take 40 mg by mouth every other day. ) 30 tablet 2  . Potassium 99 MG TABS Take 99 mg by mouth in the morning and at bedtime.     Marland Kitchen rOPINIRole (REQUIP) 0.5 MG  tablet Take 1 tablet (0.5 mg total) by mouth at bedtime. 30 tablet 4  . tamsulosin (FLOMAX) 0.4 MG CAPS capsule Take 2 capsules (0.8 mg total) by mouth daily. 60 capsule 3  . TRUEplus Lancets 28G MISC Check blood sugar fasting and before meals and again if pt feels bad (symptoms of hypo). 100 each 12   No current facility-administered medications on file prior to visit.    Allergies  Allergen Reactions  . Cymbalta [Duloxetine Hcl] Other (See Comments)    Caused anorgasmia  . Lisinopril Cough  . Other Nausea  And Vomiting    Mayonnaise mustard ketchup  . Robaxin [Methocarbamol] Other (See Comments)    Had hiccups x 4 hours after taking    Social History   Socioeconomic History  . Marital status: Significant Other    Spouse name: Not on file  . Number of children: Not on file  . Years of education: Not on file  . Highest education level: Not on file  Occupational History  . Occupation: Clinical biochemist  Tobacco Use  . Smoking status: Former Smoker    Quit date: 10/16/1988    Years since quitting: 31.3  . Smokeless tobacco: Never Used  Substance and Sexual Activity  . Alcohol use: No  . Drug use: No  . Sexual activity: Not on file  Other Topics Concern  . Not on file  Social History Narrative   Volunteers as Airline pilot   Social Determinants of Radio broadcast assistant Strain:   . Difficulty of Paying Living Expenses:   Food Insecurity:   . Worried About Charity fundraiser in the Last Year:   . Arboriculturist in the Last Year:   Transportation Needs:   . Film/video editor (Medical):   Marland Kitchen Lack of Transportation (Non-Medical):   Physical Activity:   . Days of Exercise per Week:   . Minutes of Exercise per Session:   Stress:   . Feeling of Stress :   Social Connections:   . Frequency of Communication with Friends and Family:   . Frequency of Social Gatherings with Friends and Family:   . Attends Religious Services:   . Active Member of Clubs or  Organizations:   . Attends Archivist Meetings:   Marland Kitchen Marital Status:   Intimate Partner Violence:   . Fear of Current or Ex-Partner:   . Emotionally Abused:   Marland Kitchen Physically Abused:   . Sexually Abused:     Family History  Problem Relation Age of Onset  . Pancreatic cancer Mother   . CAD Father   . Colon cancer Paternal Grandfather     Past Surgical History:  Procedure Laterality Date  . BACK SURGERY  1993   lumbar  . BIOPSY  02/07/2019   Procedure: BIOPSY;  Surgeon: Danie Binder, MD;  Location: [facility];  Service: Endoscopy;;  . CERVICAL SPINE SURGERY  2000  . COLONOSCOPY N/A 02/07/2019   Dr. Oneida Alar: External hemorrhoids next colonoscopy in 10 years  . ESOPHAGOGASTRODUODENOSCOPY N/A 02/07/2019   Dr. Oneida Alar: Barrett's esophagus without dysplasia chronic inactive gastritis but no H. 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 ESOPHAGOGASTRODUODENOSCOPY N/A 01/05/2020   Procedure: ESOPHAGOGASTRODUODENOSCOPY (EGD);  Surgeon: Danie Binder, MD;  Location: [facility];  Service: Endoscopy;  Laterality: N/A;  10:30am  . HAND SURGERY    . SAVORY DILATION N/A 01/05/2020   Procedure: SAVORY DILATION;  Surgeon: Danie Binder, MD;  Location: [facility];  Service: Endoscopy;  Laterality: N/A;    ROS: Review of Systems Negative except as stated above  PHYSICAL EXAM: BP 130/62   Pulse 68   Temp 98.1 F (36.7 C)   Resp 16   Wt 237 lb 6.4 oz (107.7 kg)   SpO2 98%   BMI 35.06 kg/m   Wt Readings from Last 3 Encounters:  02/05/20 237 lb 6.4 oz (107.7 kg)  01/15/20 236 lb (107 kg)  12/12/19 233 lb 9.6 oz (106 kg)  Physical Exam General appearance - alert, well appearing, obese middle-age Caucasian male and in no distress Mental status - normal mood, behavior, speech, dress, motor activity, and thought processes Neck - supple, no significant adenopathy Chest - clear to  auscultation, no wheezes, rales or rhonchi, symmetric air entry Heart - normal rate, regular rhythm, normal S1, S2, no murmurs, rubs, clicks or gallops Extremities - peripheral pulses normal, no pedal edema, no clubbing or cyanosis  CMP Latest Ref Rng & Units 11/19/2018 09/08/2018 07/08/2018  Glucose 65 - 99 mg/dL 102(H) 187(H) -  BUN 6 - 24 mg/dL 15 17 -  Creatinine 0.76 - 1.27 mg/dL 0.74(L) 1.00 -  Sodium 134 - 144 mmol/L 137 134(L) -  Potassium 3.5 - 5.2 mmol/L 4.1 4.2 4.1  Chloride 96 - 106 mmol/L 101 97(L) -  CO2 20 - 29 mmol/L 21 - -  Calcium 8.7 - 10.2 mg/dL 9.7 - -  Total Protein 6.0 - 8.5 g/dL 6.7 - -  Total Bilirubin 0.0 - 1.2 mg/dL <0.2 - -  Alkaline Phos 39 - 117 IU/L 55 - -  AST 0 - 40 IU/L 29 - -  ALT 0 - 44 IU/L 37 - -   Lipid Panel     Component Value Date/Time   CHOL 95 (L) 11/19/2018 1040   TRIG 97 11/19/2018 1040   HDL 43 11/19/2018 1040   CHOLHDL 2.2 11/19/2018 1040   CHOLHDL 4.8 10/17/2016 1012   VLDL NOT CALC 10/17/2016 1012   LDLCALC 33 11/19/2018 1040    CBC    Component Value Date/Time   WBC 5.6 01/05/2020 1157   RBC 4.20 (L) 01/05/2020 1157   HGB 12.2 (L) 01/05/2020 1157   HGB 13.5 08/13/2019 1617   HCT 38.2 (L) 01/05/2020 1157   HCT 40.7 08/13/2019 1617   PLT 196 01/05/2020 1157   PLT 221 08/13/2019 1617   MCV 91.0 01/05/2020 1157   MCV 91 08/13/2019 1617   MCH 29.0 01/05/2020 1157   MCHC 31.9 01/05/2020 1157   RDW 13.2 01/05/2020 1157   RDW 12.9 08/13/2019 1617   LYMPHSABS 1.4 01/05/2020 1157   LYMPHSABS 2.0 08/13/2019 1617   MONOABS 0.5 01/05/2020 1157   EOSABS 0.3 01/05/2020 1157   EOSABS 0.4 08/13/2019 1617   BASOSABS 0.1 01/05/2020 1157   BASOSABS 0.1 08/13/2019 1617    ASSESSMENT AND PLAN: 1. Type 2 diabetes mellitus without complication, without long-term current use of insulin ([place]) Patient will continue glipizide and Metformin 500 mg daily.  Encourage healthy eating habits - POCT glucose (manual entry) - Lipid panel -  Comprehensive metabolic panel - Hemoglobin A1c  2. Essential hypertension At goal.  Continue lisinopril  3. OSA (obstructive sleep apnea) Advised patient to try to decrease the pressure on the CPAP machine from 19 to 18 to see whether it decreases the feeling of abdominal fullness that he has in the mornings.  4. Iron deficiency anemia, unspecified iron deficiency anemia type Patient will try taking the iron 3-4 times a week.  Also discussed iron rich foods like spinach.  Encouraged him to google iron rich foods to get a more comprehensive list  5. Pyloric stenosis in adult Not bothersome to him at this time.  Went over signs and symptoms that would suggest obstruction and require emergent evaluation.  6. Educated about COVID-19 virus infection Discussed Covid vaccination and encouraged him to get the vaccine but patient has decided that he does not want it.  7. Polyuria - Urinalysis  Patient was given the opportunity to ask questions.  Patient verbalized understanding of the plan and was able to repeat key elements of the plan.   Orders Placed This Encounter  Procedures  . Lipid panel  . Comprehensive metabolic panel  . Hemoglobin A1c  . Urinalysis  . POCT glucose (manual entry)     Requested Prescriptions    No prescriptions requested or ordered in this encounter    Return in about 4 months (around 06/06/2020).  Karle Plumber, MD, FACP

## 2020-02-05 NOTE — Patient Instructions (Signed)
Check your blood pressure at least about once a week.  Goal is 130/80 or lower.  Try taking the iron supplement tablets at least 3-4 times a week.  Also google iron rich foods and try to eat the several times a week.  Try decreasing the pressure on your CPAP machine to 18.

## 2020-02-06 LAB — LIPID PANEL
Chol/HDL Ratio: 3.8 ratio (ref 0.0–5.0)
Cholesterol, Total: 150 mg/dL (ref 100–199)
HDL: 40 mg/dL (ref >39)
LDL Chol Calc (NIH): 81 mg/dL (ref 0–99)
Triglycerides: 168 mg/dL — ABNORMAL HIGH (ref 0–149)
VLDL Cholesterol Cal: 29 mg/dL (ref 5–40)

## 2020-02-06 LAB — COMPREHENSIVE METABOLIC PANEL
ALT: 31 IU/L (ref 0–44)
AST: 30 IU/L (ref 0–40)
Albumin/Globulin Ratio: 1.8 (ref 1.2–2.2)
Albumin: 4.4 g/dL (ref 3.8–4.9)
Alkaline Phosphatase: 63 IU/L (ref 39–117)
BUN/Creatinine Ratio: 18 (ref 9–20)
BUN: 15 mg/dL (ref 6–24)
Bilirubin Total: <0.2 mg/dL (ref 0.0–1.2)
CO2: 24 mmol/L (ref 20–29)
Calcium: 10.2 mg/dL (ref 8.7–10.2)
Chloride: 103 mmol/L (ref 96–106)
Creatinine, Ser: 0.83 mg/dL (ref 0.76–1.27)
GFR calc Af Amer: 114 mL/min/{1.73_m2} (ref >59)
GFR calc non Af Amer: 98 mL/min/{1.73_m2} (ref >59)
Globulin, Total: 2.5 g/dL (ref 1.5–4.5)
Glucose: 104 mg/dL — ABNORMAL HIGH (ref 65–99)
Potassium: 4.2 mmol/L (ref 3.5–5.2)
Sodium: 140 mmol/L (ref 134–144)
Total Protein: 6.9 g/dL (ref 6.0–8.5)

## 2020-02-06 LAB — HEMOGLOBIN A1C
Est. average glucose Bld gHb Est-mCnc: 140 mg/dL
Hgb A1c MFr Bld: 6.5 % — ABNORMAL HIGH (ref 4.8–5.6)

## 2020-02-11 MED FILL — LISINOPRIL 20 MG TABLET: 20 | 30 days supply | Qty: 30 | Fill #1

## 2020-02-17 ENCOUNTER — Ambulatory Visit: Payer: Self-pay

## 2020-02-19 ENCOUNTER — Other Ambulatory Visit: Payer: Self-pay | Admitting: Internal Medicine

## 2020-02-19 DIAGNOSIS — E119 Type 2 diabetes mellitus without complications: Secondary | ICD-10-CM

## 2020-02-19 MED FILL — rOPINIRole HCL 0.5 MG TABS: 0.5 | 30 days supply | Qty: 30 | Fill #4

## 2020-02-20 ENCOUNTER — Other Ambulatory Visit: Payer: Self-pay | Admitting: Internal Medicine

## 2020-02-22 ENCOUNTER — Encounter: Payer: Self-pay | Admitting: Internal Medicine

## 2020-02-27 ENCOUNTER — Other Ambulatory Visit: Payer: Self-pay

## 2020-02-27 ENCOUNTER — Encounter: Payer: Self-pay | Admitting: Internal Medicine

## 2020-02-27 ENCOUNTER — Ambulatory Visit: Payer: Self-pay | Attending: Internal Medicine

## 2020-03-11 MED FILL — TAMSULOSIN HCL 0.4 MG CAP: 0.4 | 30 days supply | Qty: 60 | Fill #3

## 2020-03-19 ENCOUNTER — Other Ambulatory Visit: Payer: Self-pay | Admitting: Internal Medicine

## 2020-03-19 DIAGNOSIS — E119 Type 2 diabetes mellitus without complications: Secondary | ICD-10-CM

## 2020-03-19 MED FILL — PANTOPRAZOLE SOD DR 40 MG T: 40 | 30 days supply | Qty: 60 | Fill #0

## 2020-03-19 MED FILL — LISINOPRIL 20 MG TABLET: 20 | 30 days supply | Qty: 30 | Fill #2

## 2020-03-19 MED FILL — metFORMIN HCL 1000 MG TABS: 1000 | 30 days supply | Qty: 30 | Fill #0

## 2020-04-20 ENCOUNTER — Other Ambulatory Visit: Payer: Self-pay | Admitting: Internal Medicine

## 2020-04-20 DIAGNOSIS — N401 Enlarged prostate with lower urinary tract symptoms: Secondary | ICD-10-CM

## 2020-04-20 MED FILL — ?ROPINIROLE HCL 0.5MG TAB: 0.5 | 30 days supply | Qty: 30 | Fill #1

## 2020-04-20 MED FILL — TAMSULOSIN HCL 0.4 MG CAP: 0.4 | 30 days supply | Qty: 60 | Fill #0

## 2020-04-20 MED FILL — LISINOPRIL 20 MG TABLET: 20 | 30 days supply | Qty: 30 | Fill #3

## 2020-05-03 MED FILL — ?GLIPIZIDE 5MG TABLET: 5 | 30 days supply | Qty: 60 | Fill #2

## 2020-05-04 ENCOUNTER — Other Ambulatory Visit: Payer: Self-pay | Admitting: Internal Medicine

## 2020-05-04 ENCOUNTER — Encounter: Payer: Self-pay | Admitting: Internal Medicine

## 2020-05-04 MED ORDER — LISINOPRIL 20 MG PO TABS: 30.0000 mg | ORAL_TABLET | Freq: Every day | ORAL | 3 refills | Status: DC

## 2020-05-04 MED FILL — LISINOPRIL 20 MG TABLET: 20 | 30 days supply | Qty: 45 | Fill #0

## 2020-05-12 MED FILL — ?GLIPIZIDE 5MG TABLET: 5 | 30 days supply | Qty: 60 | Fill #2

## 2020-05-17 ENCOUNTER — Other Ambulatory Visit: Payer: Self-pay | Admitting: Internal Medicine

## 2020-05-17 DIAGNOSIS — E119 Type 2 diabetes mellitus without complications: Secondary | ICD-10-CM

## 2020-05-17 MED FILL — metFORMIN HCL 1000 MG TABS: 1000 | 30 days supply | Qty: 30 | Fill #0

## 2020-05-17 MED FILL — ?ROPINIROLE HCL 0.5MG TAB: 0.5 | 30 days supply | Qty: 30 | Fill #2

## 2020-05-24 MED FILL — TAMSULOSIN HCL 0.4 MG CAP: 0.4 | 30 days supply | Qty: 60 | Fill #1

## 2020-05-28 MED FILL — ?ROPINIROLE HCL 0.5MG TAB: 0.5 | 30 days supply | Qty: 30 | Fill #2

## 2020-05-28 MED FILL — metFORMIN HCL 1000 MG TABS: 1000 | 30 days supply | Qty: 30 | Fill #0

## 2020-06-07 ENCOUNTER — Ambulatory Visit (HOSPITAL_BASED_OUTPATIENT_CLINIC_OR_DEPARTMENT_OTHER): Payer: Self-pay | Admitting: Licensed Clinical Social Worker

## 2020-06-07 ENCOUNTER — Ambulatory Visit: Payer: Self-pay | Attending: Internal Medicine | Admitting: Internal Medicine

## 2020-06-07 ENCOUNTER — Encounter: Payer: Self-pay | Admitting: Internal Medicine

## 2020-06-07 ENCOUNTER — Other Ambulatory Visit: Payer: Self-pay

## 2020-06-07 VITALS — BP 120/60 | HR 62 | Temp 98.0°F | Resp 16 | Wt 231.0 lb

## 2020-06-07 DIAGNOSIS — G4733 Obstructive sleep apnea (adult) (pediatric): Secondary | ICD-10-CM

## 2020-06-07 DIAGNOSIS — Z2821 Immunization not carried out because of patient refusal: Secondary | ICD-10-CM

## 2020-06-07 DIAGNOSIS — I10 Essential (primary) hypertension: Secondary | ICD-10-CM

## 2020-06-07 DIAGNOSIS — F4320 Adjustment disorder, unspecified: Secondary | ICD-10-CM | POA: Insufficient documentation

## 2020-06-07 DIAGNOSIS — B351 Tinea unguium: Secondary | ICD-10-CM

## 2020-06-07 DIAGNOSIS — Z23 Encounter for immunization: Secondary | ICD-10-CM

## 2020-06-07 DIAGNOSIS — E1142 Type 2 diabetes mellitus with diabetic polyneuropathy: Secondary | ICD-10-CM

## 2020-06-07 DIAGNOSIS — F4321 Adjustment disorder with depressed mood: Secondary | ICD-10-CM | POA: Insufficient documentation

## 2020-06-07 DIAGNOSIS — G5603 Carpal tunnel syndrome, bilateral upper limbs: Secondary | ICD-10-CM

## 2020-06-07 DIAGNOSIS — F4323 Adjustment disorder with mixed anxiety and depressed mood: Secondary | ICD-10-CM

## 2020-06-07 LAB — POCT GLYCOSYLATED HEMOGLOBIN (HGB A1C): HbA1c, POC (controlled diabetic range): 6.5 % (ref 0.0–7.0)

## 2020-06-07 LAB — GLUCOSE, POCT (MANUAL RESULT ENTRY): POC Glucose: 103 mg/dl — AB (ref 70–99)

## 2020-06-07 MED ORDER — TERBINAFINE HCL 250 MG PO TABS: 250.0000 mg | ORAL_TABLET | Freq: Every day | ORAL | 0 refills | Status: DC

## 2020-06-07 MED FILL — ?GLIPIZIDE 5MG TABLET: 5 | 30 days supply | Qty: 60 | Fill #3

## 2020-06-07 MED FILL — LISINOPRIL 20 MG TABLET: 20 | 30 days supply | Qty: 45 | Fill #1

## 2020-06-07 MED FILL — TERBINAFINE HCL 250 MG TABS: 250 | 30 days supply | Qty: 30 | Fill #0

## 2020-06-07 NOTE — Patient Instructions (Addendum)
Start Lamisil 250 mg daily for the fungus on the toenails.  If you develop any pain in the right upper part of the abdomen, yellowing of the skin or unexplained nausea/vomiting, you should stop the medication and come in to be seen.  If we do not have the wrist splints today, you can purchase them at any medical supply store.  Please cut back on the sweet drinks including sodas and sweet tea.  Influenza Virus Vaccine injection (Fluarix) What is this medicine? INFLUENZA VIRUS VACCINE (in floo EN zuh VAHY ruhs vak SEEN) helps to reduce the risk of getting influenza also known as the flu. This medicine may be used for other purposes; ask your health care provider or pharmacist if you have questions. COMMON BRAND NAME(S): Fluarix, Fluzone What should I tell my health care provider before I take this medicine? They need to know if you have any of these conditions:  bleeding disorder like hemophilia  fever or infection  Guillain-Barre syndrome or other neurological problems  immune system problems  infection with the human immunodeficiency virus (HIV) or AIDS  low blood platelet counts  multiple sclerosis  an unusual or allergic reaction to influenza virus vaccine, eggs, chicken proteins, latex, gentamicin, other medicines, foods, dyes or preservatives  pregnant or trying to get pregnant  breast-feeding How should I use this medicine? This vaccine is for injection into a muscle. It is given by a health care professional. A copy of Vaccine Information Statements will be given before each vaccination. Read this sheet carefully each time. The sheet may change frequently. Talk to your pediatrician regarding the use of this medicine in children. Special care may be needed. Overdosage: If you think you have taken too much of this medicine contact a poison control center or emergency room at once. NOTE: This medicine is only for you. Do not share this medicine with others. What if I miss a  dose? This does not apply. What may interact with this medicine?  chemotherapy or radiation therapy  medicines that lower your immune system like etanercept, anakinra, infliximab, and adalimumab  medicines that treat or prevent blood clots like warfarin  phenytoin  steroid medicines like prednisone or cortisone  theophylline  vaccines This list may not describe all possible interactions. Give your health care provider a list of all the medicines, herbs, non-prescription drugs, or dietary supplements you use. Also tell them if you smoke, drink alcohol, or use illegal drugs. Some items may interact with your medicine. What should I watch for while using this medicine? Report any side effects that do not go away within 3 days to your doctor or health care professional. Call your health care provider if any unusual symptoms occur within 6 weeks of receiving this vaccine. You may still catch the flu, but the illness is not usually as bad. You cannot get the flu from the vaccine. The vaccine will not protect against colds or other illnesses that may cause fever. The vaccine is needed every year. What side effects may I notice from receiving this medicine? Side effects that you should report to your doctor or health care professional as soon as possible:  allergic reactions like skin rash, itching or hives, swelling of the face, lips, or tongue Side effects that usually do not require medical attention (report to your doctor or health care professional if they continue or are bothersome):  fever  headache  muscle aches and pains  pain, tenderness, redness, or swelling at site where injected  weak or tired This list may not describe all possible side effects. Call your doctor for medical advice about side effects. You may report side effects to FDA at 1-800-FDA-1088. Where should I keep my medicine? This vaccine is only given in a clinic, pharmacy, doctor's office, or other health care  setting and will not be stored at home. NOTE: This sheet is a summary. It may not cover all possible information. If you have questions about this medicine, talk to your doctor, pharmacist, or health care provider.  2020 Elsevier/Gold Standard (2008-04-29 09:30:40)

## 2020-06-07 NOTE — Progress Notes (Signed)
Patient ID: Jeremy [name], male    DOB: 04-28-1961  MRN: [id number]  CC: Diabetes and Hypertension   Subjective: Jeremy [name] is a 59 y.o. male who presents for chronic ds management His concerns today include:  Pt with hx of DM, HL, HTN,GERD,IDA(Barrett's esophagus and moderate gastritis thought to be NSAID induced on EGD), pyloric stenosis, chronic LBP, ED, OSA, RLS.  HM: Will get flu shot.  Does not get COVID vaccine and does not want it.  Needs eye exam  Daughter died in Apr 12, 2023.  He is still grieving the loss.  She was 57 years old.  HTN:  Compliant with Lisinopril but taking only 20 mg 5 days and 30 mg 2 days a wk. he states he takes it this way because if he takes 30 mg every day it interferes with his ability to get and maintain an erection during intercourse.  He limits salt in the foods.  No chest pains. -He checks blood pressure intermittently at home.  Reports systolic blood pressure range has been in the low 140s.  OSA: tolerating CPAP.  Dec pressure to 18 as discussed on last visit.   DIABETES TYPE 2/Obesity Last A1C:   Results for orders placed or performed in visit on 06/07/20  POCT glucose (manual entry)  Result Value Ref Range   POC Glucose 103 (A) 70 - 99 mg/dl  POCT glycosylated hemoglobin (Hb A1C)  Result Value Ref Range   Hemoglobin A1C     HbA1c POC (<> result, manual entry)     HbA1c, POC (prediabetic range)     HbA1c, POC (controlled diabetic range) 6.5 0.0 - 7.0 %    Med Adherence:  [x]  Yes -He cut back on Metformin 1 gram 1/2 BID to 1/4 pill BID.  Tolerating Glucotrol Medication side effects:  []  Yes    [x]  No Home Monitoring?  []  Yes    [x]  No Home glucose results range: Diet Adherence: [x]  Yes  -cut back on red meat.  He admits to drinking sweet tea and regular soda.  Also drinking more water.  Lost 6 pounds since last visit. Numbness of the feet? []  Yes    [x]  No Retinopathy hx? []  Yes    []  No Last eye exam: Overdue for eye exam.  He is  uninsured. Comments: Complains of discoloration of some of the toenails.  Complains of numbness in both hands when he wakes up during the night and in the mornings.  He has noticed this for about the past 2 months. Patient Active Problem List   Diagnosis Date Noted  . Opiate withdrawal ([place]) 02/05/2020  . 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 Congenital hypertrophic pyloric stenosis   . Duodenal web   . Barrett's esophagus without dysplasia 05/19/2019  . OSA (obstructive sleep apnea) 04/30/2019  . Iron deficiency anemia 11/25/2018  . Phimosis of penis 11/19/2018  . Esophageal dysphagia 08/19/2018  . Benign prostatic hyperplasia with post-void dribbling 01/31/2018  . Absence of bladder continence 01/31/2018  . Obesity (BMI 30-39.9) 11/05/2017  . GERD (gastroesophageal reflux disease) 06/03/2017  . Chronic pain of both knees 06/03/2017  . Chronic lower back pain 06/03/2017  . Type 2 diabetes mellitus without complication, without long-term current use of insulin ([place]) 04/12/2016  . Essential hypertension 04/12/2016     Current Outpatient Medications on File Prior to Visit  Medication Sig Dispense Refill  . acetaminophen (TYLENOL) 500 MG tablet Take 500-1,000 mg by mouth See admin instructions. 1000 mg in  the morning and 500 mg in the evening    . diphenhydrAMINE (BENADRYL) 25 MG tablet Take 50 mg by mouth every 6 (six) hours as needed for itching or allergies (bee stings).     Marland Kitchen glipiZIDE (GLUCOTROL) 5 MG tablet TAKE 1 TABLETS BY MOUTH IN THE MORNINGS AND 1 TABLET IN THE EVENINGS. 60 tablet 6  . lisinopril (ZESTRIL) 20 MG tablet Take 1.5 tablets (30 mg total) by mouth daily. 135 tablet 3  . loratadine (CLARITIN) 10 MG tablet Take 10 mg by mouth daily.    . metFORMIN (GLUCOPHAGE) 1000 MG tablet TAKE 1/2 TABLET (500 MG TOTAL) BY MOUTH 2 (TWO) TIMES DAILY WITH A MEAL. 30 tablet 0  . naproxen (NAPROSYN) 500 MG tablet TAKE 1 TABLET (500 MG TOTAL) BY MOUTH 2 (TWO) TIMES DAILY WITH A MEAL.  (Patient taking differently: Take 440 mg by mouth 2 (two) times daily with a meal. ) 60 tablet 0  . pantoprazole (PROTONIX) 20 MG tablet Take 1 tablet (20 mg total) by mouth daily. (Patient taking differently: Take 40 mg by mouth daily. ) 30 tablet 2  . Potassium 99 MG TABS Take 99 mg by mouth in the morning and at bedtime.     Marland Kitchen rOPINIRole (REQUIP) 0.5 MG tablet TAKE 1 TABLET (0.5 MG TOTAL) BY MOUTH AT BEDTIME. 30 tablet 4  . tamsulosin (FLOMAX) 0.4 MG CAPS capsule TAKE 2 CAPSULES (0.8 MG TOTAL) BY MOUTH DAILY. 60 capsule 3  . bismuth subsalicylate (PEPTO BISMOL) 262 MG/15ML suspension Take 30 mLs by mouth every 6 (six) hours as needed for indigestion. (Patient not taking: Reported on 06/07/2020)    . Blood Glucose Monitoring Suppl (TRUE METRIX METER) w/Device KIT 1 each by Does not apply route 4 (four) times daily -  before meals and at bedtime. (Patient taking differently: 1 each by Does not apply route 4 (four) times daily -  before meals and at bedtime. 1-2 times per month) 1 kit 0  . ferrous sulfate 325 (65 FE) MG tablet Take 1 tablet (325 mg total) by mouth daily with breakfast. (Patient not taking: Reported on 06/07/2020) 100 tablet 1  . glucose blood (TRUE METRIX BLOOD GLUCOSE TEST) test strip Use as instructed 100 each 12  . Liniments (BLUE-EMU SUPER STRENGTH EX) Apply 1 application topically daily as needed (pain).    Marland Kitchen loperamide (IMODIUM A-D) 2 MG tablet Take 2-4 mg by mouth as needed for diarrhea or loose stools. (Patient not taking: Reported on 06/07/2020)    . pantoprazole (PROTONIX) 40 MG tablet TAKE 1 TABLET (40 MG TOTAL) BY MOUTH 2 (TWO) TIMES DAILY BEFORE A MEAL. 60 tablet 3  . TRUEplus Lancets 28G MISC Check blood sugar fasting and before meals and again if pt feels bad (symptoms of hypo). 100 each 12   No current facility-administered medications on file prior to visit.    Allergies  Allergen Reactions  . Cymbalta [Duloxetine Hcl] Other (See Comments)    Caused anorgasmia  .  Lisinopril Cough  . Other Nausea And Vomiting    Mayonnaise mustard ketchup  . Robaxin [Methocarbamol] Other (See Comments)    Had hiccups x 4 hours after taking    Social History   Socioeconomic History  . Marital status: Significant Other    Spouse name: Not on file  . Number of children: Not on file  . Years of education: Not on file  . Highest education level: Not on file  Occupational History  . Occupation: Clinical biochemist  Tobacco Use  . Smoking status: Former Smoker    Quit date: 10/16/1988    Years since quitting: 31.6  . Smokeless tobacco: Never Used  Vaping Use  . Vaping Use: Never used  Substance and Sexual Activity  . Alcohol use: No  . Drug use: No  . Sexual activity: Not on file  Other Topics Concern  . Not on file  Social History Narrative   Volunteers as Airline pilot   Social Determinants of Health   Financial Resource Strain:   . Difficulty of Paying Living Expenses: Not on file  Food Insecurity:   . Worried About Charity fundraiser in the Last Year: Not on file  . Ran Out of Food in the Last Year: Not on file  Transportation Needs:   . Lack of Transportation (Medical): Not on file  . Lack of Transportation (Non-Medical): Not on file  Physical Activity:   . Days of Exercise per Week: Not on file  . Minutes of Exercise per Session: Not on file  Stress:   . Feeling of Stress : Not on file  Social Connections:   . Frequency of Communication with Friends and Family: Not on file  . Frequency of Social Gatherings with Friends and Family: Not on file  . Attends Religious Services: Not on file  . Active Member of Clubs or Organizations: Not on file  . Attends Archivist Meetings: Not on file  . Marital Status: Not on file  Intimate Partner Violence:   . Fear of Current or Ex-Partner: Not on file  . Emotionally Abused: Not on file  . Physically Abused: Not on file  . Sexually Abused: Not on file    Family History  Problem Relation Age  of Onset  . Pancreatic cancer Mother   . CAD Father   . Colon cancer Paternal Grandfather     Past Surgical History:  Procedure Laterality Date  . BACK SURGERY  1993   lumbar  . BIOPSY  02/07/2019   Procedure: BIOPSY;  Surgeon: Danie Binder, MD;  Location: [facility];  Service: Endoscopy;;  . CERVICAL SPINE SURGERY  2000  . COLONOSCOPY N/A 02/07/2019   Dr. Oneida Alar: External hemorrhoids next colonoscopy in 10 years  . ESOPHAGOGASTRODUODENOSCOPY N/A 02/07/2019   Dr. Oneida Alar: Barrett's esophagus without dysplasia chronic inactive gastritis but no H. 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 ESOPHAGOGASTRODUODENOSCOPY N/A 01/05/2020   Procedure: ESOPHAGOGASTRODUODENOSCOPY (EGD);  Surgeon: Danie Binder, MD;  Location: [facility];  Service: Endoscopy;  Laterality: N/A;  10:30am  . HAND SURGERY    . SAVORY DILATION N/A 01/05/2020   Procedure: SAVORY DILATION;  Surgeon: Danie Binder, MD;  Location: [facility];  Service: Endoscopy;  Laterality: N/A;    ROS: Review of Systems Negative except as stated above  PHYSICAL EXAM: BP 120/60   Pulse 62   Temp 98 F (36.7 C)   Resp 16   Wt 231 lb (104.8 kg)   SpO2 96%   BMI 34.11 kg/m   Wt Readings from Last 3 Encounters:  06/07/20 231 lb (104.8 kg)  02/05/20 237 lb 6.4 oz (107.7 kg)  01/15/20 236 lb (107 kg)    Physical Exam   General appearance - alert, well appearing, and in no distress Mental status - normal mood, behavior, speech, dress, motor activity, and thought processes Mouth - mucous membranes moist, pharynx normal without lesions Neck -  supple, no significant adenopathy Chest - clear to auscultation, no wheezes, rales or rhonchi, symmetric air entry Heart - normal rate, regular rhythm, normal S1, S2, no murmurs, rubs, clicks or gallops Extremities - peripheral pulses normal, no pedal edema, no clubbing or cyanosis MSK:  Hands -no wasting of intrinsic hand muscles.  Grip is 5/5 bilaterally.  Tinel's sign positive bilaterally. Diabetic Foot Exam - Simple   Simple Foot Form Visual Inspection See comments: Yes Sensation Testing See comments: Yes Pulse Check Posterior Tibialis and Dorsalis pulse intact bilaterally: Yes Comments Some of the toenails especially the ones on the big toe are thick and discolored.  He has decreased sensation on the plantar surface of both feet.     Depression screen [facility] 2/9 06/07/2020 07/08/2018 11/05/2017  Decreased Interest 0 0 3  Down, Depressed, Hopeless 2 0 0  PHQ - 2 Score 2 0 3  Altered sleeping 0 - 1  Tired, decreased energy 3 - 1  Change in appetite 1 - 1  Feeling bad or failure about yourself  1 - 0  Trouble concentrating 0 - 0  Moving slowly or fidgety/restless 0 - 0  Suicidal thoughts 0 - 0  PHQ-9 Score 7 - 6    CMP Latest Ref Rng & Units 02/05/2020 11/19/2018 09/08/2018  Glucose 65 - 99 mg/dL 104(H) 102(H) 187(H)  BUN 6 - 24 mg/dL 15 15 17   Creatinine 0.76 - 1.27 mg/dL 0.83 0.74(L) 1.00  Sodium 134 - 144 mmol/L 140 137 134(L)  Potassium 3.5 - 5.2 mmol/L 4.2 4.1 4.2  Chloride 96 - 106 mmol/L 103 101 97(L)  CO2 20 - 29 mmol/L 24 21 -  Calcium 8.7 - 10.2 mg/dL 10.2 9.7 -  Total Protein 6.0 - 8.5 g/dL 6.9 6.7 -  Total Bilirubin 0.0 - 1.2 mg/dL <0.2 <0.2 -  Alkaline Phos 39 - 117 IU/L 63 55 -  AST 0 - 40 IU/L 30 29 -  ALT 0 - 44 IU/L 31 37 -   Lipid Panel     Component Value Date/Time   CHOL 150 02/05/2020 1442   TRIG 168 (H) 02/05/2020 1442   HDL 40 02/05/2020 1442   CHOLHDL 3.8 02/05/2020 1442   CHOLHDL 4.8 10/17/2016 1012   VLDL NOT CALC 10/17/2016 1012   LDLCALC 81 02/05/2020 1442    CBC    Component Value Date/Time   WBC 5.6 01/05/2020 1157   RBC 4.20 (L) 01/05/2020 1157   HGB 12.2 (L) 01/05/2020 1157   HGB 13.5 08/13/2019 1617   HCT 38.2 (L) 01/05/2020 1157   HCT 40.7 08/13/2019 1617   PLT 196 01/05/2020 1157   PLT 221 08/13/2019 1617    MCV 91.0 01/05/2020 1157   MCV 91 08/13/2019 1617   MCH 29.0 01/05/2020 1157   MCHC 31.9 01/05/2020 1157   RDW 13.2 01/05/2020 1157   RDW 12.9 08/13/2019 1617   LYMPHSABS 1.4 01/05/2020 1157   LYMPHSABS 2.0 08/13/2019 1617   MONOABS 0.5 01/05/2020 1157   EOSABS 0.3 01/05/2020 1157   EOSABS 0.4 08/13/2019 1617   BASOSABS 0.1 01/05/2020 1157   BASOSABS 0.1 08/13/2019 1617    ASSESSMENT AND PLAN: 1. Type 2 diabetes mellitus with diabetic polyneuropathy, without long-term current use of insulin (HCC) At goal.  Continue Metformin and Glucotrol. Advised to get an eye exam when he is financially able. Encourage healthy eating habits.  Advised him to eliminate sugary drinks from the diet. - POCT glucose (manual entry) - POCT glycosylated hemoglobin (Hb  A1C)  2. Essential hypertension At goal.  Continue lisinopril  3. OSA (obstructive sleep apnea) Tolerating CPAP.  4. Grief reaction Seen by LCSW today.  5. Onychomycosis of toenail Discussed treatment of onychomycosis with Lamisil.  Informed patient that sometimes the nail does not return to normal appearance even with treatment.  Went over possible side effects of Lamisil especially drug-induced hepatitis.  Advised that if he develops any pain in the right side of his abdomen, yellowing of the skin or unexplained nausea/vomiting, he should stop the medicine and be seen.  He is willing to try the Lamisil. - terbinafine (LAMISIL) 250 MG tablet; Take 1 tablet (250 mg total) by mouth daily.  Dispense: 30 tablet; Refill: 0 - Hepatic Function Panel  6. Bilateral carpal tunnel syndrome Discussed diagnosis and management.  Advised of use of wrist splint.  7. Need for influenza vaccination Given today.  8. COVID-19 virus vaccination declined    Patient was given the opportunity to ask questions.  Patient verbalized understanding of the plan and was able to repeat key elements of the plan.   Orders Placed This Encounter  Procedures  .  Flu Vaccine QUAD 36+ mos IM  . Hepatic Function Panel  . POCT glucose (manual entry)  . POCT glycosylated hemoglobin (Hb A1C)     Requested Prescriptions   Signed Prescriptions Disp Refills  . terbinafine (LAMISIL) 250 MG tablet 30 tablet 0    Sig: Take 1 tablet (250 mg total) by mouth daily.    Return in about 4 months (around 10/07/2020).  Karle Plumber, MD, FACP

## 2020-06-08 ENCOUNTER — Encounter: Payer: Self-pay | Admitting: Internal Medicine

## 2020-06-08 LAB — HEPATIC FUNCTION PANEL
ALT: 27 IU/L (ref 0–44)
AST: 28 IU/L (ref 0–40)
Albumin: 4.6 g/dL (ref 3.8–4.9)
Alkaline Phosphatase: 60 IU/L (ref 48–121)
Bilirubin Total: 0.3 mg/dL (ref 0.0–1.2)
Bilirubin, Direct: 0.11 mg/dL (ref 0.00–0.40)
Total Protein: 7.2 g/dL (ref 6.0–8.5)

## 2020-06-10 ENCOUNTER — Encounter: Payer: Self-pay | Admitting: Internal Medicine

## 2020-06-13 ENCOUNTER — Encounter: Payer: Self-pay | Admitting: Internal Medicine

## 2020-06-16 ENCOUNTER — Encounter: Payer: Self-pay | Admitting: Internal Medicine

## 2020-06-16 NOTE — BH Specialist Note (Signed)
Integrated Behavioral Health Initial Visit  MRN: [id number] Name: Jeremy [name]  Number of Integrated Behavioral Health Clinician visits:: 1/6 Session Start time: 3:10 PM  Session End time: 3:30 PM Total time: 20  Type of Service: Integrated Behavioral Health- Individual Interpretor:No. Interpretor Name and Language: NA   Warm Hand Off Completed.       SUBJECTIVE: Jeremy [name] is a 59 y.o. male accompanied by Self Patient was referred by Dr. Laural Benes for grief. Patient reports the following symptoms/concerns: Patient reports difficulty managing depression and anxiety symptoms triggered by recent loss of adult daughter Duration of problem: June 2021; Severity of problem: moderate  OBJECTIVE: Mood: Appropriate and Affect: Appropriate Risk of harm to self or others: No plan to harm self or others  LIFE CONTEXT: Family and Social: She receives support from family and friends School/Work: Patient reports financial strain.  Employed at fire [employer] Self-Care: Patient participated in therapy however reported negative experience with counselor Life Changes: Patient has difficulty managing grief after the loss of adult daughter  GOALS ADDRESSED: Patient will: 1. Reduce symptoms of: grief 2. Increase knowledge and/or ability of: coping skills  3. Demonstrate ability to: Begin healthy grieving over loss  INTERVENTIONS: Interventions utilized: Supportive Counseling, Psychoeducation and/or Health Education and Link to [facility]  Standardized Assessments completed: GAD-7 and PHQ 2&9  ASSESSMENT: Patient currently experiencing difficulty managing symptoms of grief after the loss of adult daughter in June 2021.  Jeremy [name] and/or homicidal ideations and receives limited support from family and friends.   Patient may benefit from initiating grief therapy to strengthen support system and to assist in management of symptoms.  LCSW provided validation and encouragement.  Stages of  grief were discussed and patient was successful in identifying healthy coping skills. He is open to behavioral health services.  LCSW provided patient with information on Civil engineer, contracting.  PLAN: 1. Follow up with behavioral health clinician on : Contact LCSW with additional behavioral health and/or resource needs 2. Behavioral recommendations: Continue utilizing healthy coping skills on a daily basis and follow up with [facility] to establish individual counseling 3. Referral(s): Industrial/product designer 4. "From scale of 1-10, how likely are you to follow plan?":   Jeremy Larsson, LCSW 06/16/2020 7:47 PM

## 2020-06-22 MED FILL — ?ROPINIROLE HCL 0.5MG TAB: 0.5 | 30 days supply | Qty: 30 | Fill #3

## 2020-06-26 ENCOUNTER — Encounter: Payer: Self-pay | Admitting: Internal Medicine

## 2020-06-28 ENCOUNTER — Encounter: Payer: Self-pay | Admitting: Internal Medicine

## 2020-06-28 MED FILL — TAMSULOSIN HCL 0.4 MG CAP: 0.4 | 30 days supply | Qty: 60 | Fill #2

## 2020-07-01 ENCOUNTER — Encounter: Payer: Self-pay | Admitting: Internal Medicine

## 2020-07-01 ENCOUNTER — Ambulatory Visit (INDEPENDENT_AMBULATORY_CARE_PROVIDER_SITE_OTHER): Payer: Self-pay | Admitting: Internal Medicine

## 2020-07-01 ENCOUNTER — Other Ambulatory Visit: Payer: Self-pay

## 2020-07-01 VITALS — BP 150/67 | HR 61 | Temp 97.5°F | Ht 70.0 in | Wt 229.0 lb

## 2020-07-01 DIAGNOSIS — K2270 Barrett's esophagus without dysplasia: Secondary | ICD-10-CM

## 2020-07-01 DIAGNOSIS — Q438 Other specified congenital malformations of intestine: Secondary | ICD-10-CM

## 2020-07-01 DIAGNOSIS — K311 Adult hypertrophic pyloric stenosis: Secondary | ICD-10-CM

## 2020-07-01 DIAGNOSIS — K2100 Gastro-esophageal reflux disease with esophagitis, without bleeding: Secondary | ICD-10-CM

## 2020-07-01 MED ORDER — SUCRALFATE 1 G PO TABS
1.0000 g | ORAL_TABLET | Freq: Four times a day (QID) | ORAL | 2 refills | Status: DC
Start: 2020-07-01 — End: 2020-09-20

## 2020-07-01 NOTE — Patient Instructions (Signed)
Continue on Protonix 40 mg.  I will send in a prescription to [facility] to take up to 4 times needed.  Crush pill and mix with 10 to 15 mL of water to make a slurry prior to ingestion.  I printed off information for a low FODMAP diet.  Follow-up with GI in 4 months or sooner if needed.  At [facility] we value your feedback. You may receive a survey about your visit today. Please share your experience as we strive to create trusting relationships with our patients to provide genuine, compassionate, quality care.  We appreciate your understanding and patience as we review any laboratory studies, imaging, and other diagnostic tests that are ordered as we care for you. Our office policy is 5 business days for review of these results, and any emergent or urgent results are addressed in a timely manner for your best interest. If you do not hear from our office in 1 week, please contact us.   We also encourage the use of MyChart, which contains your medical information for your review as well. If you are not enrolled in this feature, an access code is on this after visit summary for your convenience. Thank you for allowing [name] to be involved in your care.  It was great to see you today!  I hope you have a great rest of your summer!!  Hennie Duos. Marletta Lor, D.O. Gastroenterology and Hepatology [facility]

## 2020-07-02 MED FILL — SUCRALFATE 1 GM TABLET: 1 | 30 days supply | Qty: 120 | Fill #0

## 2020-07-03 ENCOUNTER — Encounter: Payer: Self-pay | Admitting: Internal Medicine

## 2020-07-05 MED FILL — ?ROPINIROLE HCL 0.5MG TAB: 0.5 | 30 days supply | Qty: 30 | Fill #3

## 2020-07-05 MED FILL — TAMSULOSIN HCL 0.4 MG CAP: 0.4 | 30 days supply | Qty: 60 | Fill #2

## 2020-07-07 NOTE — Progress Notes (Signed)
Referring Provider: [name], MD Primary Care Physician:  [name], MD Primary GI:  Dr. Abbey Chatters  Chief Complaint  Patient presents with  . Follow-up    saw blood in stool last week     HPI:   Jeremy [name] is a 59 y.o. male who presents to the clinic today for follow-up visit.  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  The duodenal stenosis was unable to be traversed with endoscope on last EGD.  We attempted to place a capsule endoscopy past the stricture which was unsuccessful.  He underwent dilation of both his pylorus and duodenal stricture with minimal improvement in his symptoms.  He has seen surgery who recommends conservative management at this stage as patient is hesitant to undergo any surgical fixation at this time.  Patient states he is doing about the same.  Currently takes Protonix.  He is having issues with diarrhea.  Patient notes taking naproxen 2 times daily.  Also has a history of Barrett's esophagus without dysplasia.  Past Medical History:  Diagnosis Date  . Diabetes mellitus without complication ([place])   . GERD (gastroesophageal reflux disease)   . Hyperlipidemia   . Hypertension   . IDA (iron deficiency anemia)   . Rash of entire body 03/2016    Past Surgical History:  Procedure Laterality Date  . BACK SURGERY  1993   lumbar  . BIOPSY  02/07/2019   Procedure: BIOPSY;  Surgeon: Danie Binder, MD;  Location: [facility];  Service: Endoscopy;;  . CERVICAL SPINE SURGERY  2000  . COLONOSCOPY N/A 02/07/2019   Dr. Oneida Alar: External hemorrhoids next colonoscopy in 10 years  . ESOPHAGOGASTRODUODENOSCOPY N/A 02/07/2019   Dr. Oneida Alar: Barrett's esophagus without dysplasia chronic inactive gastritis but no H. 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 ESOPHAGOGASTRODUODENOSCOPY  N/A 01/05/2020   Procedure: ESOPHAGOGASTRODUODENOSCOPY (EGD);  Surgeon: Danie Binder, MD;  Location: [facility];  Service: Endoscopy;  Laterality: N/A;  10:30am  . HAND SURGERY    . SAVORY DILATION N/A 01/05/2020   Procedure: SAVORY DILATION;  Surgeon: Danie Binder, MD;  Location: [facility];  Service: Endoscopy;  Laterality: N/A;    Current Outpatient Medications  Medication Sig Dispense Refill  . acetaminophen (TYLENOL) 500 MG tablet Take 500-1,000 mg by mouth See admin instructions. 1000 mg in the morning and 500 mg in the evening    . glipiZIDE (GLUCOTROL) 5 MG tablet TAKE 1 TABLETS BY MOUTH IN THE MORNINGS AND 1 TABLET IN THE EVENINGS. 60 tablet 6  . Liniments (BLUE-EMU SUPER STRENGTH EX) Apply 1 application topically daily as needed (pain).    Marland Kitchen lisinopril (ZESTRIL) 20 MG tablet Take 1.5 tablets (30 mg total) by mouth daily. (Patient taking differently: Take 30 mg by mouth daily. Takes 59m every other day and 267mon the other days) 135 tablet 3  . loratadine (CLARITIN) 10 MG tablet Take 10 mg by mouth daily.    . metFORMIN (GLUCOPHAGE) 1000 MG tablet TAKE 1/2 TABLET (500 MG TOTAL) BY MOUTH 2 (TWO) TIMES DAILY WITH A MEAL. (Patient taking differently: Takes 1/4 twice a day) 30 tablet 0  . naproxen (NAPROSYN) 500 MG tablet TAKE 1 TABLET (500 MG TOTAL) BY MOUTH 2 (TWO) TIMES DAILY WITH A MEAL. (Patient taking differently: Take 440 mg by mouth  2 (two) times daily with a meal. ) 60 tablet 0  . pantoprazole (PROTONIX) 20 MG tablet Take 1 tablet (20 mg total) by mouth daily. (Patient taking differently: Take 40 mg by mouth daily. Takes 2 weekly) 30 tablet 2  . Potassium 99 MG TABS Take 99 mg by mouth in the morning and at bedtime.     Marland Kitchen rOPINIRole (REQUIP) 0.5 MG tablet TAKE 1 TABLET (0.5 MG TOTAL) BY MOUTH AT BEDTIME. 30 tablet 4  . tamsulosin (FLOMAX) 0.4 MG CAPS capsule TAKE 2 CAPSULES (0.8 MG TOTAL) BY MOUTH DAILY. (Patient taking differently: Take 0.8 mg by mouth daily. Takes 2  capsules every other day and 1 capsule on the other days) 60 capsule 3  . terbinafine (LAMISIL) 250 MG tablet Take 1 tablet (250 mg total) by mouth daily. 30 tablet 0  . bismuth subsalicylate (PEPTO BISMOL) 262 MG/15ML suspension Take 30 mLs by mouth every 6 (six) hours as needed for indigestion. (Patient not taking: Reported on 06/07/2020)    . Blood Glucose Monitoring Suppl (TRUE METRIX METER) w/Device KIT 1 each by Does not apply route 4 (four) times daily -  before meals and at bedtime. (Patient taking differently: 1 each by Does not apply route 4 (four) times daily -  before meals and at bedtime. 1-2 times per month) 1 kit 0  . diphenhydrAMINE (BENADRYL) 25 MG tablet Take 50 mg by mouth every 6 (six) hours as needed for itching or allergies (bee stings).  (Patient not taking: Reported on 07/01/2020)    . ferrous sulfate 325 (65 FE) MG tablet Take 1 tablet (325 mg total) by mouth daily with breakfast. (Patient not taking: Reported on 06/07/2020) 100 tablet 1  . glucose blood (TRUE METRIX BLOOD GLUCOSE TEST) test strip Use as instructed 100 each 12  . loperamide (IMODIUM A-D) 2 MG tablet Take 2-4 mg by mouth as needed for diarrhea or loose stools. (Patient not taking: Reported on 06/07/2020)    . pantoprazole (PROTONIX) 40 MG tablet TAKE 1 TABLET (40 MG TOTAL) BY MOUTH 2 (TWO) TIMES DAILY BEFORE A MEAL. (Patient not taking: Reported on 07/01/2020) 60 tablet 3  . sucralfate (CARAFATE) 1 g tablet Take 1 tablet (1 g total) by mouth 4 (four) times daily. 120 tablet 2  . TRUEplus Lancets 28G MISC Check blood sugar fasting and before meals and again if pt feels bad (symptoms of hypo). 100 each 12   No current facility-administered medications for this visit.    Allergies as of 07/01/2020 - Review Complete 07/01/2020  Allergen Reaction Noted  . Cymbalta [duloxetine hcl] Other (See Comments) 06/05/2017  . Lisinopril Cough 12/12/2019  . Other Nausea And Vomiting 01/24/2019  . Robaxin [methocarbamol] Other  (See Comments) 06/02/2019    Family History  Problem Relation Age of Onset  . Pancreatic cancer Mother   . CAD Father   . Colon cancer Paternal Grandfather     Social History   Socioeconomic History  . Marital status: Significant Other    Spouse name: Not on file  . Number of children: Not on file  . Years of education: Not on file  . Highest education level: Not on file  Occupational History  . Occupation: Clinical biochemist  Tobacco Use  . Smoking status: Former Smoker    Quit date: 10/16/1988    Years since quitting: 31.7  . Smokeless tobacco: Never Used  Vaping Use  . Vaping Use: Never used  Substance and Sexual Activity  . Alcohol use:  No  . Drug use: No  . Sexual activity: Not on file  Other Topics Concern  . Not on file  Social History Narrative   Volunteers as Airline pilot   Social Determinants of Health   Financial Resource Strain:   . Difficulty of Paying Living Expenses: Not on file  Food Insecurity:   . Worried About Charity fundraiser in the Last Year: Not on file  . Ran Out of Food in the Last Year: Not on file  Transportation Needs:   . Lack of Transportation (Medical): Not on file  . Lack of Transportation (Non-Medical): Not on file  Physical Activity:   . Days of Exercise per Week: Not on file  . Minutes of Exercise per Session: Not on file  Stress:   . Feeling of Stress : Not on file  Social Connections:   . Frequency of Communication with Friends and Family: Not on file  . Frequency of Social Gatherings with Friends and Family: Not on file  . Attends Religious Services: Not on file  . Active Member of Clubs or Organizations: Not on file  . Attends Archivist Meetings: Not on file  . Marital Status: Not on file    Subjective: Review of Systems  Constitutional: Negative for chills and fever.  HENT: Negative for congestion and hearing loss.   Eyes: Negative for blurred vision and double vision.  Respiratory: Negative for cough  and shortness of breath.   Cardiovascular: Negative for chest pain and palpitations.  Gastrointestinal: Positive for abdominal pain, diarrhea and heartburn. Negative for blood in stool, constipation, melena and vomiting.  Genitourinary: Negative for dysuria and urgency.  Musculoskeletal: Negative for joint pain and myalgias.  Skin: Negative for itching and rash.  Neurological: Negative for dizziness and headaches.  Psychiatric/Behavioral: Negative for depression. The patient is not nervous/anxious.      Objective: BP (!) 150/67   Pulse 61   Temp (!) 97.5 F (36.4 C) (Temporal)   Ht 5' 10"  (1.778 m)   Wt 229 lb (103.9 kg)   BMI 32.86 kg/m  Physical Exam Constitutional:      Appearance: Normal appearance.  HENT:     Head: Normocephalic and atraumatic.  Eyes:     Extraocular Movements: Extraocular movements intact.     Conjunctiva/sclera: Conjunctivae normal.  Cardiovascular:     Rate and Rhythm: Normal rate and regular rhythm.  Pulmonary:     Effort: Pulmonary effort is normal.     Breath sounds: Normal breath sounds.  Abdominal:     General: Bowel sounds are normal.     Palpations: Abdomen is soft.  Musculoskeletal:        General: Normal range of motion.     Cervical back: Normal range of motion and neck supple.  Skin:    General: Skin is warm.  Neurological:     General: No focal deficit present.     Mental Status: He is alert and oriented to person, place, and time.  Psychiatric:        Mood and Affect: Mood normal.        Behavior: Behavior normal.      Assessment: *Chronic reflux *Duodenal/pyloric stenosis *Barrett's esophagus without dysplasia *Diarrhea  Plan: -Discussed EGD findings in depth with patient today.  We will continue with conservative management at this time.   -Patient counseled to avoid NSAIDs as best he can.   -Continue on Protonix.   -We will add Carafate to take as needed for pain.   -  Printed off a low-FODMAP diet for  diarrhea. -Repeat EGD April 2023 for Barrett's surveillance. -Patient follow-up in 4 months or sooner if needed.  07/07/2020 1:31 PM   Disclaimer: This note was dictated with voice recognition software. Similar sounding words can inadvertently be transcribed and may not be corrected upon review.

## 2020-07-08 ENCOUNTER — Other Ambulatory Visit: Payer: Self-pay

## 2020-07-08 ENCOUNTER — Ambulatory Visit: Payer: Self-pay | Attending: Internal Medicine

## 2020-07-12 ENCOUNTER — Other Ambulatory Visit: Payer: Self-pay | Admitting: Internal Medicine

## 2020-07-12 DIAGNOSIS — B351 Tinea unguium: Secondary | ICD-10-CM

## 2020-07-12 MED FILL — ?GLIPIZIDE 5MG TABLET: 5 | 30 days supply | Qty: 60 | Fill #4

## 2020-07-12 NOTE — Telephone Encounter (Signed)
Requested medication (s) are due for refill today: yes  Requested medication (s) are on the active medication list: yes  Last refill: 06/07/20 #30  0 refills  Future visit scheduled: No  Notes to clinic:  Off-protocol    Requested Prescriptions  Pending Prescriptions Disp Refills   terbinafine (LAMISIL) 250 MG tablet [Pharmacy Med Name: TERBINAFINE HCL 250 MG TABS 250 Tablet] 30 tablet 0    Sig: Take 1 tablet (250 mg total) by mouth daily.      Off-Protocol Failed - 07/12/2020  9:10 AM      Failed - Medication not assigned to a protocol, review manually.      Passed - Valid encounter within last 12 months    Recent Outpatient Visits           1 month ago Type 2 diabetes mellitus with diabetic polyneuropathy, without long-term current use of insulin (HCC)   [facility] [place], Gavin Pound B, MD   5 months ago Type 2 diabetes mellitus without complication, without long-term current use of insulin (HCC)   [place] [facility] Marcine Matar, MD   7 months ago OSA (obstructive sleep apnea)   [facility] Marcine Matar, MD   10 months ago Need for influenza vaccination   [facility] [place], Cornelius Moras, RPH-CPP   10 months ago Type 2 diabetes mellitus without complication, without long-term current use of insulin [facility])   [facility] Marcine Matar, MD

## 2020-07-13 MED FILL — TERBINAFINE HCL 250 MG TABS: 250 | 30 days supply | Qty: 30 | Fill #0

## 2020-07-20 ENCOUNTER — Ambulatory Visit: Payer: Self-pay | Admitting: Internal Medicine

## 2020-07-20 NOTE — Telephone Encounter (Signed)
Pt is scheduled for an appt with pcp 10/7 at 150pm

## 2020-07-20 NOTE — Telephone Encounter (Signed)
Will forward to pcp

## 2020-07-20 NOTE — Telephone Encounter (Signed)
Pt reports chest pain, onset 2 weeks ago. States constant 3/10. "A little right of breastbone, under nipple line, radiates to left side at times." Denies any SOB, no nausea, no sweating or dizziness BP checked during call, 170/62. States "I can hear my heart beat in my left ear at times."  Advised UC/ED, pt declined. Would like to hear from Dr. Laural Benes. Assured pt NT would route to practice for PCPs review. Advised ED if symptoms worsen. Pt states "Im hoping she tells me something else." Reason for Disposition  [1] Chest pain lasts > 5 minutes AND [2] occurred in past 3 days (72 hours) (Exception: feels exactly the same as previously diagnosed heartburn and has accompanying sour taste in mouth)  Answer Assessment - Initial Assessment Questions 1. LOCATION: "Where does it hurt?"        A little left of center of breast bone and a little below nipple line 2. RADIATION: "Does the pain go anywhere else?" (e.g., into neck, jaw, arms, back)     A little to left breast to side, mild 3. ONSET: "When did the chest pain begin?" (Minutes, hours or days)      2 weeks ago 4. PATTERN "Does the pain come and go, or has it been constant since it started?"  "Does it get worse with exertion?"      Constant, no worse with exertion. 5. DURATION: "How long does it last" (e.g., seconds, minutes, hours)     NA 6. SEVERITY: "How bad is the pain?"  (e.g., Scale 1-10; mild, moderate, or severe)    - MILD (1-3): doesn't interfere with normal activities     - MODERATE (4-7): interferes with normal activities or awakens from sleep    - SEVERE (8-10): excruciating pain, unable to do any normal activities       3/10 7. CARDIAC RISK FACTORS: "Do you have any history of heart problems or risk factors for heart disease?" (e.g., angina, prior heart attack; diabetes, high blood pressure, high cholesterol, smoker, or strong family history of heart disease)     HTN 8. PULMONARY RISK FACTORS: "Do you have any history of lung  disease?"  (e.g., blood clots in lung, asthma, emphysema, birth control pills)      9. CAUSE: "What do you think is causing the chest pain?"     unsure 10. OTHER SYMPTOMS: "Do you have any other symptoms?" (e.g., dizziness, nausea, vomiting, sweating, fever, difficulty breathing, cough)     None  Protocols used: CHEST PAIN-A-AH

## 2020-07-21 ENCOUNTER — Encounter: Payer: Self-pay | Admitting: Internal Medicine

## 2020-07-22 ENCOUNTER — Ambulatory Visit: Payer: Self-pay | Attending: Internal Medicine | Admitting: Internal Medicine

## 2020-07-22 ENCOUNTER — Other Ambulatory Visit: Payer: Self-pay

## 2020-07-22 ENCOUNTER — Encounter (HOSPITAL_COMMUNITY): Payer: Self-pay | Admitting: Emergency Medicine

## 2020-07-22 ENCOUNTER — Encounter: Payer: Self-pay | Admitting: Internal Medicine

## 2020-07-22 ENCOUNTER — Emergency Department (HOSPITAL_COMMUNITY)
Admission: EM | Admit: 2020-07-22 | Discharge: 2020-07-22 | Disposition: A | Discharge status: Home / Self Care | Payer: Self-pay | Attending: Emergency Medicine | Admitting: Emergency Medicine

## 2020-07-22 ENCOUNTER — Emergency Department (HOSPITAL_COMMUNITY): Payer: Self-pay

## 2020-07-22 VITALS — BP 162/78 | HR 46 | Resp 16 | Wt 234.4 lb

## 2020-07-22 DIAGNOSIS — Z20822 Contact with and (suspected) exposure to covid-19: Secondary | ICD-10-CM | POA: Insufficient documentation

## 2020-07-22 DIAGNOSIS — Z79899 Other long term (current) drug therapy: Secondary | ICD-10-CM | POA: Insufficient documentation

## 2020-07-22 DIAGNOSIS — E669 Obesity, unspecified: Secondary | ICD-10-CM | POA: Insufficient documentation

## 2020-07-22 DIAGNOSIS — R079 Chest pain, unspecified: Secondary | ICD-10-CM | POA: Insufficient documentation

## 2020-07-22 DIAGNOSIS — E785 Hyperlipidemia, unspecified: Secondary | ICD-10-CM | POA: Insufficient documentation

## 2020-07-22 DIAGNOSIS — Z7984 Long term (current) use of oral hypoglycemic drugs: Secondary | ICD-10-CM | POA: Insufficient documentation

## 2020-07-22 DIAGNOSIS — K219 Gastro-esophageal reflux disease without esophagitis: Secondary | ICD-10-CM | POA: Insufficient documentation

## 2020-07-22 DIAGNOSIS — Z791 Long term (current) use of non-steroidal anti-inflammatories (NSAID): Secondary | ICD-10-CM | POA: Insufficient documentation

## 2020-07-22 DIAGNOSIS — D509 Iron deficiency anemia, unspecified: Secondary | ICD-10-CM | POA: Insufficient documentation

## 2020-07-22 DIAGNOSIS — Z6833 Body mass index (BMI) 33.0-33.9, adult: Secondary | ICD-10-CM | POA: Insufficient documentation

## 2020-07-22 DIAGNOSIS — R600 Localized edema: Secondary | ICD-10-CM | POA: Insufficient documentation

## 2020-07-22 DIAGNOSIS — N401 Enlarged prostate with lower urinary tract symptoms: Secondary | ICD-10-CM | POA: Insufficient documentation

## 2020-07-22 DIAGNOSIS — E119 Type 2 diabetes mellitus without complications: Secondary | ICD-10-CM | POA: Insufficient documentation

## 2020-07-22 DIAGNOSIS — Z888 Allergy status to other drugs, medicaments and biological substances status: Secondary | ICD-10-CM | POA: Insufficient documentation

## 2020-07-22 DIAGNOSIS — Z87891 Personal history of nicotine dependence: Secondary | ICD-10-CM | POA: Insufficient documentation

## 2020-07-22 DIAGNOSIS — I10 Essential (primary) hypertension: Secondary | ICD-10-CM | POA: Insufficient documentation

## 2020-07-22 DIAGNOSIS — G4733 Obstructive sleep apnea (adult) (pediatric): Secondary | ICD-10-CM | POA: Insufficient documentation

## 2020-07-22 DIAGNOSIS — R059 Cough, unspecified: Secondary | ICD-10-CM | POA: Insufficient documentation

## 2020-07-22 DIAGNOSIS — Z8719 Personal history of other diseases of the digestive system: Secondary | ICD-10-CM | POA: Insufficient documentation

## 2020-07-22 DIAGNOSIS — A084 Viral intestinal infection, unspecified: Secondary | ICD-10-CM | POA: Insufficient documentation

## 2020-07-22 DIAGNOSIS — K2270 Barrett's esophagus without dysplasia: Secondary | ICD-10-CM | POA: Insufficient documentation

## 2020-07-22 DIAGNOSIS — R109 Unspecified abdominal pain: Secondary | ICD-10-CM | POA: Insufficient documentation

## 2020-07-22 LAB — HEPATIC FUNCTION PANEL
ALT: 23 U/L (ref 0–44)
AST: 26 U/L (ref 15–41)
Albumin: 4.1 g/dL (ref 3.5–5.0)
Alkaline Phosphatase: 54 U/L (ref 38–126)
Bilirubin, Direct: 0.1 mg/dL (ref 0.0–0.2)
Indirect Bilirubin: 0.5 mg/dL (ref 0.3–0.9)
Total Bilirubin: 0.6 mg/dL (ref 0.3–1.2)
Total Protein: 7.4 g/dL (ref 6.5–8.1)

## 2020-07-22 LAB — CBC
HCT: 43.8 % (ref 39.0–52.0)
Hemoglobin: 13.7 g/dL (ref 13.0–17.0)
MCH: 29.1 pg (ref 26.0–34.0)
MCHC: 31.3 g/dL (ref 30.0–36.0)
MCV: 93.2 fL (ref 80.0–100.0)
Platelets: 202 10*3/uL (ref 150–400)
RBC: 4.7 MIL/uL (ref 4.22–5.81)
RDW: 13.1 % (ref 11.5–15.5)
WBC: 9.4 10*3/uL (ref 4.0–10.5)
nRBC: 0 % (ref 0.0–0.2)

## 2020-07-22 LAB — RESPIRATORY PANEL BY RT PCR (FLU A&B, COVID)
Influenza A by PCR: NEGATIVE
Influenza B by PCR: NEGATIVE
SARS Coronavirus 2 by RT PCR: NEGATIVE

## 2020-07-22 LAB — TROPONIN I (HIGH SENSITIVITY)
Troponin I (High Sensitivity): 7 ng/L (ref <18)
Troponin I (High Sensitivity): 7 ng/L (ref <18)

## 2020-07-22 LAB — BASIC METABOLIC PANEL
Anion gap: 11 (ref 5–15)
BUN: 13 mg/dL (ref 6–20)
CO2: 27 mmol/L (ref 22–32)
Calcium: 10.1 mg/dL (ref 8.9–10.3)
Chloride: 101 mmol/L (ref 98–111)
Creatinine, Ser: 0.94 mg/dL (ref 0.61–1.24)
GFR calc non Af Amer: >60 mL/min (ref >60)
Glucose, Bld: 183 mg/dL — ABNORMAL HIGH (ref 70–99)
Potassium: 3.9 mmol/L (ref 3.5–5.1)
Sodium: 139 mmol/L (ref 135–145)

## 2020-07-22 LAB — BRAIN NATRIURETIC PEPTIDE: B Natriuretic Peptide: 73.5 pg/mL (ref 0.0–100.0)

## 2020-07-22 LAB — LIPASE, BLOOD: Lipase: 37 U/L (ref 11–51)

## 2020-07-22 MED ORDER — PROMETHAZINE HCL 25 MG/ML IJ SOLN
12.5000 mg | Freq: Once | INTRAMUSCULAR | Status: AC
  Administered 2020-07-22: 12.5 mg via INTRAVENOUS
  Filled 2020-07-22: qty 1

## 2020-07-22 MED ORDER — ROPINIROLE HCL 0.5 MG PO TABS
0.5000 mg | ORAL_TABLET | Freq: Once | ORAL | Status: AC
  Administered 2020-07-22: 0.5 mg via ORAL
  Filled 2020-07-22: qty 1

## 2020-07-22 MED ORDER — SODIUM CHLORIDE 0.9 % IV BOLUS
1000.0000 mL | Freq: Once | INTRAVENOUS | Status: AC
  Administered 2020-07-22: 1000 mL via INTRAVENOUS

## 2020-07-22 MED ORDER — LIDOCAINE VISCOUS HCL 2 % MT SOLN
15.0000 mL | Freq: Once | OROMUCOSAL | Status: AC
  Administered 2020-07-22: 15 mL via ORAL
  Filled 2020-07-22: qty 15

## 2020-07-22 MED ORDER — ONDANSETRON 4 MG PO TBDP: 4.0000 mg | ORAL_TABLET | Freq: Three times a day (TID) | ORAL | 0 refills | Status: DC | PRN

## 2020-07-22 MED ORDER — ALUM & MAG HYDROXIDE-SIMETH 200-200-20 MG/5ML PO SUSP
30.0000 mL | Freq: Once | ORAL | Status: AC
  Administered 2020-07-22: 30 mL via ORAL
  Filled 2020-07-22: qty 30

## 2020-07-22 MED ORDER — KETOROLAC TROMETHAMINE 30 MG/ML IJ SOLN
30.0000 mg | Freq: Once | INTRAMUSCULAR | Status: AC
  Administered 2020-07-22: 30 mg via INTRAVENOUS
  Filled 2020-07-22: qty 1

## 2020-07-22 NOTE — Patient Instructions (Signed)
Please go to the emergency room at [facility] for further evaluation of your abdominal and chest pains.

## 2020-07-22 NOTE — ED Notes (Signed)
Pt d/c by MD and is provided with d/c instructions and follow up care, pt out of the ED in wheel chair  

## 2020-07-22 NOTE — ED Provider Notes (Signed)
°[facility] EMERGENCY DEPARTMENT °Provider Note ° ° °CSN: [id number] °Arrival date & time: 07/22/20  1552 ° °  ° °History °Chief Complaint  °Patient presents with  °• Abdominal Pain  °• Chest Pain  ° ° °Jeremy [name] is a 59 y.o. male with past medical history of diabetes, hypertension, hyperlipidemia, GERD, anemia who presents with chest pain and abdominal pain.  Patient states that he has had a dull, left-sided chest pain that wraps around his rib cage for 1 week.  He thinks it has been constant, but when he gets busy and distracted, he does not know if the pain is still there or not.  Pain has been present since at least this morning.  No associated shortness of breath.  Does state he has been coughing more.  Patient has also noted 2 to 3 days of epigastric/right upper quadrant pain that he describes as dull.  Patient has had at least 4 episodes of emesis today with associated nausea.  Pain worsens with eating.  Denies diarrhea, dysuria, fever. ° ° °Chest Pain °Pain location:  L chest °Pain quality: dull   °Pain severity:  Mild °Duration:  1 week °Timing:  Constant °Progression:  Unchanged °Chronicity:  New °Relieved by:  None tried °Worsened by:  Nothing °Ineffective treatments:  None tried °Associated symptoms: abdominal pain, cough, nausea and vomiting   °Associated symptoms: no back pain, no fever, no palpitations and no shortness of breath   ° ° °  ° °Past Medical History:  °Diagnosis Date  °• Diabetes mellitus without complication (HCC)   °• GERD (gastroesophageal reflux disease)   °• Hyperlipidemia   °• Hypertension   °• IDA (iron deficiency anemia)   °• Rash of entire body 03/2016  ° ° °Patient Active Problem List  ° Diagnosis Date Noted  °• Bilateral carpal tunnel syndrome 06/07/2020  °• Grief reaction 06/07/2020  °• Pyloric stenosis in adult 02/05/2020  °• Congenital hypertrophic pyloric stenosis   °• Duodenal web   °• Barrett's esophagus without dysplasia 05/19/2019  °• OSA  (obstructive sleep apnea) 04/30/2019  °• Iron deficiency anemia 11/25/2018  °• Phimosis of penis 11/19/2018  °• Esophageal dysphagia 08/19/2018  °• Benign prostatic hyperplasia with post-void dribbling 01/31/2018  °• Absence of bladder continence 01/31/2018  °• Obesity (BMI 30-39.9) 11/05/2017  °• GERD (gastroesophageal reflux disease) 06/03/2017  °• Chronic pain of both knees 06/03/2017  °• Chronic lower back pain 06/03/2017  °• Type 2 diabetes mellitus without complication, without long-term current use of insulin (HCC) 04/12/2016  °• Essential hypertension 04/12/2016  ° ° °Past Surgical History:  °Procedure Laterality Date  °• BACK SURGERY  1993  ° lumbar  °• BIOPSY  02/07/2019  ° Procedure: BIOPSY;  Surgeon: Fields, Sandi L, MD;  Location: [facility];  Service: Endoscopy;;  °• CERVICAL SPINE SURGERY  2000  °• COLONOSCOPY N/A 02/07/2019  ° Dr. Fields: External hemorrhoids next colonoscopy in 10 years  °• ESOPHAGOGASTRODUODENOSCOPY N/A 02/07/2019  ° Dr. Fields: Barrett's esophagus without dysplasia chronic inactive gastritis but no H. 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, nonbleeding duodenal diverticulum,  °• ESOPHAGOGASTRODUODENOSCOPY N/A 01/05/2020  ° Procedure: ESOPHAGOGASTRODUODENOSCOPY (EGD);  Surgeon: Fields, Sandi L, MD;  Location: [facility];  Service: Endoscopy;  Laterality: N/A;  10:30am  °• HAND SURGERY    °• SAVORY DILATION N/A 01/05/2020  ° Procedure: SAVORY DILATION;  Surgeon: Fields, Sandi L, MD;  Location: [facility];    Service: Endoscopy;  Laterality: N/A;  °  ° ° ° °Family History  °Problem Relation Age of Onset  °• Pancreatic cancer Mother   °• CAD Father   °• Colon cancer Paternal Grandfather   ° ° °Social History  ° °Tobacco Use  °• Smoking status: Former Smoker  °  Quit date: 10/16/1988  °  Years since quitting: 31.7  °• Smokeless tobacco: Never Used  °Vaping Use  °• Vaping Use: Never used  °Substance Use Topics  °• Alcohol use: No    °• Drug use: No  ° ° °Home Medications °Prior to Admission medications   °Medication Sig Start Date End Date Taking? Authorizing Provider  °acetaminophen (TYLENOL) 500 MG tablet Take 1,000 mg by mouth in the morning and at bedtime.    Yes [provider]  °glipiZIDE (GLUCOTROL) 5 MG tablet TAKE 1 TABLETS BY MOUTH IN THE MORNINGS AND 1 TABLET IN THE EVENINGS. 02/20/20  Yes Johnson, Deborah B, MD  °lisinopril (ZESTRIL) 20 MG tablet Take 1.5 tablets (30 mg total) by mouth daily. 05/04/20  Yes Johnson, Deborah B, MD  °loratadine (CLARITIN) 10 MG tablet Take 10 mg by mouth daily.   Yes [provider]  °naproxen (NAPROSYN) 500 MG tablet TAKE 1 TABLET (500 MG TOTAL) BY MOUTH 2 (TWO) TIMES DAILY WITH A MEAL. °Patient taking differently: Take 440 mg by mouth 2 (two) times daily with a meal.  08/04/19  Yes Johnson, Deborah B, MD  °pantoprazole (PROTONIX) 20 MG tablet Take 1 tablet (20 mg total) by mouth daily. °Patient taking differently: Take 40 mg by mouth daily. Takes 2 weekly 09/05/19  Yes Johnson, Deborah B, MD  °Potassium 99 MG TABS Take 99 mg by mouth daily.    Yes [provider]  °rOPINIRole (REQUIP) 0.5 MG tablet TAKE 1 TABLET (0.5 MG TOTAL) BY MOUTH AT BEDTIME. 03/19/20  Yes Johnson, Deborah B, MD  °tamsulosin (FLOMAX) 0.4 MG CAPS capsule TAKE 2 CAPSULES (0.8 MG TOTAL) BY MOUTH DAILY. °Patient taking differently: Take 0.8 mg by mouth every evening.  04/20/20  Yes Johnson, Deborah B, MD  °terbinafine (LAMISIL) 250 MG tablet TAKE 1 TABLET (250 MG TOTAL) BY MOUTH DAILY. 07/13/20  Yes Johnson, Deborah B, MD  °Blood Glucose Monitoring Suppl (TRUE METRIX METER) w/Device KIT 1 each by Does not apply route 4 (four) times daily -  before meals and at bedtime. °Patient taking differently: 1 each by Does not apply route 4 (four) times daily -  before meals and at bedtime. 1-2 times per month 04/12/16   Wright, Patrick E, MD  °ferrous sulfate 325 (65 FE) MG tablet Take 1 tablet (325 mg total) by mouth daily  with breakfast. °Patient not taking: Reported on 06/07/2020 01/05/20   Johnson, Deborah B, MD  °glucose blood (TRUE METRIX BLOOD GLUCOSE TEST) test strip Use as instructed 02/10/19   Johnson, Deborah B, MD  °Liniments (BLUE-EMU SUPER STRENGTH EX) Apply 1 application topically daily as needed (pain).    [provider]  °metFORMIN (GLUCOPHAGE) 1000 MG tablet TAKE 1/2 TABLET (500 MG TOTAL) BY MOUTH 2 (TWO) TIMES DAILY WITH A MEAL. °Patient not taking: Reported on 07/22/2020 05/17/20   Johnson, Deborah B, MD  °ondansetron (ZOFRAN ODT) 4 MG disintegrating tablet Take 1 tablet (4 mg total) by mouth every 8 (eight) hours as needed for up to 2 days for nausea or vomiting. 07/22/20 07/24/20  , , MD  °pantoprazole (PROTONIX) 40 MG tablet TAKE 1 TABLET (40 MG TOTAL) BY MOUTH   2 (TWO) TIMES DAILY BEFORE A MEAL. Patient not taking: Reported on 07/01/2020 03/19/20   [name], MD  sucralfate (CARAFATE) 1 g tablet Take 1 tablet (1 g total) by mouth 4 (four) times daily. Patient not taking: Reported on 07/22/2020 07/01/20 09/29/20  Eloise Harman, DO  TRUEplus Lancets 28G MISC Check blood sugar fasting and before meals and again if pt feels bad (symptoms of hypo). 02/10/19   [name], MD    Allergies    Cymbalta [duloxetine hcl], Lisinopril, Other, and Robaxin [methocarbamol]  Review of Systems   Review of Systems  Constitutional: Negative for chills and fever.  HENT: Negative for ear pain and sore throat.   Eyes: Negative for pain and visual disturbance.  Respiratory: Positive for cough. Negative for shortness of breath.   Cardiovascular: Positive for chest pain. Negative for palpitations.  Gastrointestinal: Positive for abdominal pain, nausea and vomiting.  Genitourinary: Negative for dysuria and hematuria.  Musculoskeletal: Negative for arthralgias and back pain.  Skin: Negative for color change and rash.  Neurological: Negative for seizures and syncope.  All other systems  reviewed and are negative.   Physical Exam Updated Vital Signs BP (!) 178/53    Pulse 64    Temp 98.2 F (36.8 C) (Oral)    Resp 20    SpO2 97%   Physical Exam Vitals and nursing note reviewed.  Constitutional:      Appearance: He is well-developed.  HENT:     Head: Normocephalic and atraumatic.  Eyes:     Conjunctiva/sclera: Conjunctivae normal.  Cardiovascular:     Rate and Rhythm: Normal rate and regular rhythm.     Heart sounds: No murmur heard.   Pulmonary:     Effort: Pulmonary effort is normal. No respiratory distress.     Breath sounds: Normal breath sounds.  Abdominal:     Palpations: Abdomen is soft.     Tenderness: There is no abdominal tenderness.  Musculoskeletal:     Cervical back: Neck supple.     Right lower leg: Edema present.     Left lower leg: Edema present.  Skin:    General: Skin is warm and dry.     Findings: No rash.  Neurological:     General: No focal deficit present.     Mental Status: He is alert and oriented to person, place, and time.     ED Results / Procedures / Treatments   Labs (all labs ordered are listed, but only abnormal results are displayed) Labs Reviewed  BASIC METABOLIC PANEL - Abnormal; Notable for the following components:      Result Value   Glucose, Bld 183 (*)    All other components within normal limits  RESPIRATORY PANEL BY RT PCR (FLU A&B, COVID)  CBC  HEPATIC FUNCTION PANEL  BRAIN NATRIURETIC PEPTIDE  LIPASE, BLOOD  TROPONIN I (HIGH SENSITIVITY)  TROPONIN I (HIGH SENSITIVITY)    EKG EKG Interpretation  Date/Time:  Thursday July 22 2020 15:58:14 EDT Ventricular Rate:  52 PR Interval:  176 QRS Duration: 90 QT Interval:  424 QTC Calculation: 394 R Axis:   93 Text Interpretation: Sinus bradycardia Rightward axis Nonspecific ST abnormality Abnormal ECG No significant change since last tracing Confirmed by Wandra Arthurs [phone]) on 07/22/2020 4:42:28 PM   Radiology DG Chest 2 View  Result Date:  07/22/2020 CLINICAL DATA:  Chest pain EXAM: CHEST - 2 VIEW COMPARISON:  11/27/2017 FINDINGS: Surgical hardware in the cervical spine. No focal opacity  or pleural effusion. Mild chronic bronchitic changes. Normal cardiomediastinal silhouette. No pneumothorax. IMPRESSION: No active cardiopulmonary disease. Electronically Signed   By: Kim  Fujinaga M.D.   On: 07/22/2020 16:35  ° ° °Procedures °Procedures (including critical care time) ° °Medications Ordered in ED °Medications  °alum & mag hydroxide-simeth (MAALOX/MYLANTA) 200-200-20 MG/5ML suspension 30 mL (30 mLs Oral Given 07/22/20 1720)  °  And  °lidocaine (XYLOCAINE) 2 % viscous mouth solution 15 mL (15 mLs Oral Given 07/22/20 1720)  °promethazine (PHENERGAN) injection 12.5 mg (12.5 mg Intravenous Given 07/22/20 1818)  °sodium chloride 0.9 % bolus 1,000 mL (0 mLs Intravenous Stopped 07/22/20 2013)  °ketorolac (TORADOL) 30 MG/ML injection 30 mg (30 mg Intravenous Given 07/22/20 1836)  °rOPINIRole (REQUIP) tablet 0.5 mg (0.5 mg Oral Given 07/22/20 1940)  ° ° °ED Course  °I have reviewed the triage vital signs and the nursing notes. ° °Pertinent labs & imaging results that were available during my care of the patient were reviewed by me and considered in my medical decision making (see chart for details). ° °  °MDM Rules/Calculators/A&P °                        ° °CBC, BMP, LFTs, lipase unremarkable.  Initial troponin was 7, repeat 7. EKG with sinus bradycardia, normal PR interval, no acute signs of ischemia.  ° °GI symptoms consistent with viral gastroenteritis. Abdominal exam benign. Based on work-up, chest pain not consistent with ACS.  History and exam not consistent with PE, aortic dissection. ° °Patient is ambulatory and comfortable.  He is appropriate for discharge at this time.  He was given prescription for several doses of Zofran and instructed to follow-up with PCP regarding his chest pain.  Patient verbalized understanding and agreement. ° °Patient was seen with  Dr. Yao. °Final Clinical Impression(s) / ED Diagnoses °Final diagnoses:  °Viral gastroenteritis  °Chest pain, unspecified type  ° ° °Rx / DC Orders °ED Discharge Orders   °      Ordered  °  ondansetron (ZOFRAN ODT) 4 MG disintegrating tablet  Every 8 hours PRN       ° 07/22/20 1951  °  °  °  ° °  °, , MD °07/22/20 2042 ° °  °Yao, David Hsienta, MD °07/22/20 2258 ° °

## 2020-07-22 NOTE — Progress Notes (Signed)
Patient ID: Jeremy [name], male    DOB: 1961/09/11  MRN: [id number]  CC: Chest Pain and Emesis   Subjective: Jeremy [name] is a 59 y.o. male who presents for acute visit His concerns today include:  Pt with hx of DM, HL, HTN,GERD,IDA(Barrett's esophagus and moderate gastritis thought to be NSAID induced on EGD), pyloric stenosis, chronic LBP, ED, OSA, RLS  Patient presents today for an acute visit with complaint of pain across his upper abdomen x2 to 3 days.  He started vomiting this morning around 5 AM.  Vomited 3 times.  Did not take any of his medications and was unable to eat breakfast.  He ate some eggs and ham for lunch today and felt that the pain was worse with food.  He ate some [place] food for lunch yesterday and wonders whether this may be causing it.  No diarrhea or fever but he has had chills.  Denies any recent sick contacts. -He is on Lamisil for nail fungus.  He states he had taken it for about a month and then had stopped it for a week but has been back on it for the past 3 weeks.  He also complains of left-sided chest pain below the left breast x1 week.  The pain is intermittent.  No radiation.  He describes it as a dull ache.  Blood pressure elevated today.  He has not taken blood pressure medication today due to nausea and vomiting.    Patient Active Problem List   Diagnosis Date Noted   Bilateral carpal tunnel syndrome 06/07/2020   Grief reaction 06/07/2020   Pyloric stenosis in adult 02/05/2020   Congenital hypertrophic pyloric stenosis    Duodenal web    Barrett's esophagus without dysplasia 05/19/2019   OSA (obstructive sleep apnea) 04/30/2019   Iron deficiency anemia 11/25/2018   Phimosis of penis 11/19/2018   Esophageal dysphagia 08/19/2018   Benign prostatic hyperplasia with post-void dribbling 01/31/2018   Absence of bladder continence 01/31/2018   Obesity (BMI 30-39.9) 11/05/2017   GERD (gastroesophageal reflux disease) 06/03/2017    Chronic pain of both knees 06/03/2017   Chronic lower back pain 06/03/2017   Type 2 diabetes mellitus without complication, without long-term current use of insulin ([facility]) 04/12/2016   Essential hypertension 04/12/2016     Current Outpatient Medications on File Prior to Visit  Medication Sig Dispense Refill   acetaminophen (TYLENOL) 500 MG tablet Take 500-1,000 mg by mouth See admin instructions. 1000 mg in the morning and 500 mg in the evening     bismuth subsalicylate (PEPTO BISMOL) 262 MG/15ML suspension Take 30 mLs by mouth every 6 (six) hours as needed for indigestion. (Patient not taking: Reported on 06/07/2020)     Blood Glucose Monitoring Suppl (TRUE METRIX METER) w/Device KIT 1 each by Does not apply route 4 (four) times daily -  before meals and at bedtime. (Patient taking differently: 1 each by Does not apply route 4 (four) times daily -  before meals and at bedtime. 1-2 times per month) 1 kit 0   diphenhydrAMINE (BENADRYL) 25 MG tablet Take 50 mg by mouth every 6 (six) hours as needed for itching or allergies (bee stings).  (Patient not taking: Reported on 07/01/2020)     ferrous sulfate 325 (65 FE) MG tablet Take 1 tablet (325 mg total) by mouth daily with breakfast. (Patient not taking: Reported on 06/07/2020) 100 tablet 1   glipiZIDE (GLUCOTROL) 5 MG tablet TAKE 1 TABLETS BY MOUTH IN THE  MORNINGS AND 1 TABLET IN THE EVENINGS. 60 tablet 6   glucose blood (TRUE METRIX BLOOD GLUCOSE TEST) test strip Use as instructed 100 each 12   Liniments (BLUE-EMU SUPER STRENGTH EX) Apply 1 application topically daily as needed (pain).     lisinopril (ZESTRIL) 20 MG tablet Take 1.5 tablets (30 mg total) by mouth daily. (Patient taking differently: Take 30 mg by mouth daily. Takes 60m every other day and 224mon the other days) 135 tablet 3   loperamide (IMODIUM A-D) 2 MG tablet Take 2-4 mg by mouth as needed for diarrhea or loose stools. (Patient not taking: Reported on 06/07/2020)      loratadine (CLARITIN) 10 MG tablet Take 10 mg by mouth daily.     metFORMIN (GLUCOPHAGE) 1000 MG tablet TAKE 1/2 TABLET (500 MG TOTAL) BY MOUTH 2 (TWO) TIMES DAILY WITH A MEAL. (Patient taking differently: Takes 1/4 twice a day) 30 tablet 0   naproxen (NAPROSYN) 500 MG tablet TAKE 1 TABLET (500 MG TOTAL) BY MOUTH 2 (TWO) TIMES DAILY WITH A MEAL. (Patient taking differently: Take 440 mg by mouth 2 (two) times daily with a meal. ) 60 tablet 0   pantoprazole (PROTONIX) 20 MG tablet Take 1 tablet (20 mg total) by mouth daily. (Patient taking differently: Take 40 mg by mouth daily. Takes 2 weekly) 30 tablet 2   pantoprazole (PROTONIX) 40 MG tablet TAKE 1 TABLET (40 MG TOTAL) BY MOUTH 2 (TWO) TIMES DAILY BEFORE A MEAL. (Patient not taking: Reported on 07/01/2020) 60 tablet 3   Potassium 99 MG TABS Take 99 mg by mouth in the morning and at bedtime.      rOPINIRole (REQUIP) 0.5 MG tablet TAKE 1 TABLET (0.5 MG TOTAL) BY MOUTH AT BEDTIME. 30 tablet 4   sucralfate (CARAFATE) 1 g tablet Take 1 tablet (1 g total) by mouth 4 (four) times daily. 120 tablet 2   tamsulosin (FLOMAX) 0.4 MG CAPS capsule TAKE 2 CAPSULES (0.8 MG TOTAL) BY MOUTH DAILY. (Patient taking differently: Take 0.8 mg by mouth daily. Takes 2 capsules every other day and 1 capsule on the other days) 60 capsule 3   terbinafine (LAMISIL) 250 MG tablet TAKE 1 TABLET (250 MG TOTAL) BY MOUTH DAILY. 30 tablet 2   TRUEplus Lancets 28G MISC Check blood sugar fasting and before meals and again if pt feels bad (symptoms of hypo). 100 each 12   No current facility-administered medications on file prior to visit.    Allergies  Allergen Reactions   Cymbalta [Duloxetine Hcl] Other (See Comments)    Caused anorgasmia   Lisinopril Cough   Other Nausea And Vomiting    Mayonnaise mustard ketchup   Robaxin [Methocarbamol] Other (See Comments)    Had hiccups x 4 hours after taking    Social History   Socioeconomic History   Marital status:  Significant Other    Spouse name: Not on file   Number of children: Not on file   Years of education: Not on file   Highest education level: Not on file  Occupational History   Occupation: geClinical biochemistTobacco Use   Smoking status: Former Smoker    Quit date: 10/16/1988    Years since quitting: 31.7   Smokeless tobacco: Never Used  VaScientific laboratory technicianse: Never used  Substance and Sexual Activity   Alcohol use: No   Drug use: No   Sexual activity: Not on file  Other Topics Concern   Not on file  Social History Narrative   Volunteers as Community education officer Strain:    Difficulty of Paying Living Expenses: Not on file  Food Insecurity:    Worried About Charity fundraiser in the Last Year: Not on file   [employer] of Food in the Last Year: Not on file  Transportation Needs:    Lack of Transportation (Medical): Not on file   Lack of Transportation (Non-Medical): Not on file  Physical Activity:    Days of Exercise per Week: Not on file   Minutes of Exercise per Session: Not on file  Stress:    Feeling of Stress : Not on file  Social Connections:    Frequency of Communication with Friends and Family: Not on file   Frequency of Social Gatherings with Friends and Family: Not on file   Attends Religious Services: Not on file   Active Member of Clubs or Organizations: Not on file   Attends Archivist Meetings: Not on file   Marital Status: Not on file  Intimate Partner Violence:    Fear of Current or Ex-Partner: Not on file   Emotionally Abused: Not on file   Physically Abused: Not on file   Sexually Abused: Not on file    Family History  Problem Relation Age of Onset   Pancreatic cancer Mother    CAD Father    Colon cancer Paternal Grandfather     Past Surgical History:  Procedure Laterality Date   BACK SURGERY  1993   lumbar   BIOPSY  02/07/2019   Procedure: BIOPSY;   Surgeon: Danie Binder, MD;  [facility]: [facility];  Service: Endoscopy;;   CERVICAL SPINE SURGERY  2000   COLONOSCOPY N/A 02/07/2019   Dr. Oneida Alar: External hemorrhoids next colonoscopy in 10 years   ESOPHAGOGASTRODUODENOSCOPY N/A 02/07/2019   Dr. Oneida Alar: Barrett's esophagus without dysplasia chronic inactive gastritis but no H. 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, nonbleeding duodenal diverticulum,   ESOPHAGOGASTRODUODENOSCOPY N/A 01/05/2020   Procedure: ESOPHAGOGASTRODUODENOSCOPY (EGD);  Surgeon: Danie Binder, MD;  Location: [facility];  Service: Endoscopy;  Laterality: N/A;  10:30am   HAND SURGERY     SAVORY DILATION N/A 01/05/2020   Procedure: SAVORY DILATION;  Surgeon: Danie Binder, MD;  Location: [facility];  Service: Endoscopy;  Laterality: N/A;    ROS: Review of Systems Negative except as stated above  PHYSICAL EXAM: BP (!) 162/78    Pulse (!) 46    Resp 16    Wt 234 lb 6.4 oz (106.3 kg)    SpO2 98%    BMI 33.63 kg/m   Physical Exam  General appearance -patient was in no acute distress when I entered the room but was not his usual perky self.  He started vomiting and vomited about 3 times in my presence.  This was associated with diaphoresis.   Mental status - normal mood, behavior, speech, dress, motor activity, and thought processes Chest - clear to auscultation, no wheezes, rales or rhonchi, symmetric air entry Heart - normal rate, regular rhythm, normal S1, S2, no murmurs, rubs, clicks or gallops Abdomen -positive tenderness across the upper abdomen but more so in the epigastric area.  CMP Latest Ref Rng & Units 06/07/2020 02/05/2020 11/19/2018  Glucose 65 - 99 mg/dL - 104(H) 102(H)  BUN 6 - 24 mg/dL - 15 15  Creatinine 0.76 - 1.27 mg/dL -  0.83 0.74(L)  Sodium 134 - 144 mmol/L - 140 137  Potassium 3.5 - 5.2 mmol/L - 4.2 4.1  Chloride 96 - 106 mmol/L - 103 101  CO2 20 - 29 mmol/L - 24 21  Calcium  8.7 - 10.2 mg/dL - 10.2 9.7  Total Protein 6.0 - 8.5 g/dL 7.2 6.9 6.7  Total Bilirubin 0.0 - 1.2 mg/dL 0.3 <0.2 <0.2  Alkaline Phos 48 - 121 IU/L 60 63 55  AST 0 - 40 IU/L _0 ALT 0 - 44 IU/L 27 31 37   Lipid Panel     Component Value Date/Time   CHOL 150 02/05/2020 1442   TRIG 168 (H) 02/05/2020 1442   HDL 40 02/05/2020 1442   CHOLHDL 3.8 02/05/2020 1442   CHOLHDL 4.8 10/17/2016 1012   VLDL NOT CALC 10/17/2016 1012   LDLCALC 81 02/05/2020 1442    CBC    Component Value Date/Time   WBC 5.6 01/05/2020 1157   RBC 4.20 (L) 01/05/2020 1157   HGB 12.2 (L) 01/05/2020 1157   HGB 13.5 08/13/2019 1617   HCT 38.2 (L) 01/05/2020 1157   HCT 40.7 08/13/2019 1617   PLT 196 01/05/2020 1157   PLT 221 08/13/2019 1617   MCV 91.0 01/05/2020 1157   MCV 91 08/13/2019 1617   MCH 29.0 01/05/2020 1157   MCHC 31.9 01/05/2020 1157   RDW 13.2 01/05/2020 1157   RDW 12.9 08/13/2019 1617   LYMPHSABS 1.4 01/05/2020 1157   LYMPHSABS 2.0 08/13/2019 1617   MONOABS 0.5 01/05/2020 1157   EOSABS 0.3 01/05/2020 1157   EOSABS 0.4 08/13/2019 1617   BASOSABS 0.1 01/05/2020 1157   BASOSABS 0.1 08/13/2019 1617   EKG reveals sinus bradycardia with heart rate of 49.  EKG appears to be normal with questionable slight T wave inversion in V1 and V2. ASSESSMENT AND PLAN:  1. Acute abdominal pain 2. Chest pain in adult Given his current symptoms and vomiting x3 times in my presence, I recommend that he be seen in the emergency room for further evaluation.  He will need a gallbladder ultrasound to rule out gallstones and evaluate for acute coronary syndrome masking of his GI symptoms. - EKG 12-Lead    Patient was given the opportunity to ask questions.  Patient verbalized understanding of the plan and was able to repeat key elements of the plan.   Orders Placed This Encounter  Procedures   EKG 12-Lead     Requested Prescriptions    No prescriptions requested or ordered in this encounter    No  follow-ups on file.  Karle Plumber, MD, FACP

## 2020-07-22 NOTE — ED Triage Notes (Signed)
Patient arrives to ED with complaints of a dull intermittent left sided chest pain x1 week as well as abdominal bloating x2 days and emesis starting last night.

## 2020-07-22 NOTE — Discharge Instructions (Signed)
Please schedule an appointment with your primary care doctor to talk by your chest pain.  You are not having a heart attack during your emergency department visit, but we do not know what is ultimately causing her chest pain.  Regarding abdominal pain and your nausea and vomiting, you likely have a viral stomach bug.  This will go away on its own with time.  You may take Zofran once every 8 hours as needed for nausea over the next couple days.  Otherwise, you can take Tylenol for abdominal pain.  Please come back to the emergency department if you have severe worsening of your symptoms.

## 2020-07-23 ENCOUNTER — Telehealth: Payer: Self-pay | Admitting: Nurse Practitioner

## 2020-07-23 ENCOUNTER — Encounter (HOSPITAL_COMMUNITY): Payer: Self-pay | Admitting: *Deleted

## 2020-07-23 ENCOUNTER — Other Ambulatory Visit: Payer: Self-pay

## 2020-07-23 ENCOUNTER — Emergency Department (HOSPITAL_COMMUNITY): Payer: Self-pay

## 2020-07-23 ENCOUNTER — Emergency Department (HOSPITAL_COMMUNITY)
Admission: EM | Admit: 2020-07-23 | Discharge: 2020-07-24 | Disposition: A | Discharge status: Home / Self Care | Payer: Self-pay | Attending: Emergency Medicine | Admitting: Emergency Medicine

## 2020-07-23 DIAGNOSIS — I10 Essential (primary) hypertension: Secondary | ICD-10-CM | POA: Insufficient documentation

## 2020-07-23 DIAGNOSIS — K219 Gastro-esophageal reflux disease without esophagitis: Secondary | ICD-10-CM | POA: Insufficient documentation

## 2020-07-23 DIAGNOSIS — R112 Nausea with vomiting, unspecified: Secondary | ICD-10-CM

## 2020-07-23 DIAGNOSIS — Z7984 Long term (current) use of oral hypoglycemic drugs: Secondary | ICD-10-CM | POA: Insufficient documentation

## 2020-07-23 DIAGNOSIS — R079 Chest pain, unspecified: Secondary | ICD-10-CM | POA: Insufficient documentation

## 2020-07-23 DIAGNOSIS — E119 Type 2 diabetes mellitus without complications: Secondary | ICD-10-CM | POA: Insufficient documentation

## 2020-07-23 DIAGNOSIS — Z87891 Personal history of nicotine dependence: Secondary | ICD-10-CM | POA: Insufficient documentation

## 2020-07-23 DIAGNOSIS — Z79899 Other long term (current) drug therapy: Secondary | ICD-10-CM | POA: Insufficient documentation

## 2020-07-23 DIAGNOSIS — R1110 Vomiting, unspecified: Secondary | ICD-10-CM | POA: Insufficient documentation

## 2020-07-23 DIAGNOSIS — R1084 Generalized abdominal pain: Secondary | ICD-10-CM | POA: Insufficient documentation

## 2020-07-23 LAB — COMPREHENSIVE METABOLIC PANEL
ALT: 26 U/L (ref 0–44)
AST: 29 U/L (ref 15–41)
Albumin: 4.3 g/dL (ref 3.5–5.0)
Alkaline Phosphatase: 54 U/L (ref 38–126)
Anion gap: 11 (ref 5–15)
BUN: 17 mg/dL (ref 6–20)
CO2: 28 mmol/L (ref 22–32)
Calcium: 9.7 mg/dL (ref 8.9–10.3)
Chloride: 98 mmol/L (ref 98–111)
Creatinine, Ser: 0.91 mg/dL (ref 0.61–1.24)
GFR, Estimated: >60 mL/min (ref >60)
Glucose, Bld: 130 mg/dL — ABNORMAL HIGH (ref 70–99)
Potassium: 3.6 mmol/L (ref 3.5–5.1)
Sodium: 137 mmol/L (ref 135–145)
Total Bilirubin: 0.7 mg/dL (ref 0.3–1.2)
Total Protein: 8 g/dL (ref 6.5–8.1)

## 2020-07-23 LAB — CBC
HCT: 46.4 % (ref 39.0–52.0)
Hemoglobin: 14.9 g/dL (ref 13.0–17.0)
MCH: 29.3 pg (ref 26.0–34.0)
MCHC: 32.1 g/dL (ref 30.0–36.0)
MCV: 91.2 fL (ref 80.0–100.0)
Platelets: 215 10*3/uL (ref 150–400)
RBC: 5.09 MIL/uL (ref 4.22–5.81)
RDW: 12.9 % (ref 11.5–15.5)
WBC: 10.4 10*3/uL (ref 4.0–10.5)
nRBC: 0 % (ref 0.0–0.2)

## 2020-07-23 LAB — TROPONIN I (HIGH SENSITIVITY)
Troponin I (High Sensitivity): 5 ng/L (ref <18)
Troponin I (High Sensitivity): 5 ng/L (ref <18)

## 2020-07-23 LAB — LIPASE, BLOOD: Lipase: 31 U/L (ref 11–51)

## 2020-07-23 MED ORDER — SODIUM CHLORIDE 0.9 % IV BOLUS
1000.0000 mL | Freq: Once | INTRAVENOUS | Status: AC
  Administered 2020-07-23: 1000 mL via INTRAVENOUS

## 2020-07-23 MED ORDER — IOHEXOL 350 MG/ML SOLN
100.0000 mL | Freq: Once | INTRAVENOUS | Status: AC | PRN
  Administered 2020-07-24: 100 mL via INTRAVENOUS

## 2020-07-23 MED ORDER — ONDANSETRON HCL 4 MG/2ML IJ SOLN
4.0000 mg | Freq: Once | INTRAMUSCULAR | Status: AC
  Administered 2020-07-23: 4 mg via INTRAVENOUS
  Filled 2020-07-23: qty 2

## 2020-07-23 MED FILL — ONDANSETRON ODT 4 MG TABLET: 4 | 2 days supply | Qty: 6 | Fill #0

## 2020-07-23 NOTE — Telephone Encounter (Signed)
Paged by switchboard to call patient. States he has had n/V, unable to keep down food/fluids. Was recently in the [facility] ED with epigastric/abdominal pain, N/V, chest pain. Cardiac workup normal. Dx likely viral gastroenteritis and d/c to home.  EGD 12/2019 with pyloric stenosis s/p dilation and mucosal disruption. 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  Concerned about progressive pyloric stenosis, duodenal stenosis with retained gastric contents resulting in vomiting. Hasn't been able to keep down food/fluids 48+ hours. Recommended come to [facility] ED for further evaluation; depending on findings may need endoscopic evaluation.

## 2020-07-23 NOTE — ED Provider Notes (Signed)
[facility] EMERGENCY DEPARTMENT Provider Note   CSN: [id number] Arrival date & time: 07/23/20  1711     History Chief Complaint  Patient presents with  . Abdominal Pain  . Chest Pain    Jeremy [name] is a 59 y.o. male.  HPI He presents for evaluation of abdominal chest pain.  He was evaluated yesterday by his PCP for same, referred to the ED where he was comprehensively evaluated.  He reports onset of this problem 3 days ago with persistent vomiting.  He feels like he is vomiting undigested food that has been present for several days.  He denies fever, chills, blood in emesis, diarrhea, constipation, weakness or dizziness.  He was given a prescription for antiemetic yesterday after he left the ED but did not get it filled yet.  He denies prior similar problems or prior abdominal surgery.  There are no other known modifying factors.    Past Medical History:  Diagnosis Date  . Diabetes mellitus without complication ([place])   . GERD (gastroesophageal reflux disease)   . Hyperlipidemia   . Hypertension   . IDA (iron deficiency anemia)   . Rash of entire body 03/2016    Patient Active Problem List   Diagnosis Date Noted  . Bilateral carpal tunnel syndrome 06/07/2020  . Grief reaction 06/07/2020  . 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 Congenital hypertrophic pyloric stenosis   . Duodenal web   . Barrett's esophagus without dysplasia 05/19/2019  . OSA (obstructive sleep apnea) 04/30/2019  . Iron deficiency anemia 11/25/2018  . Phimosis of penis 11/19/2018  . Esophageal dysphagia 08/19/2018  . Benign prostatic hyperplasia with post-void dribbling 01/31/2018  . Absence of bladder continence 01/31/2018  . Obesity (BMI 30-39.9) 11/05/2017  . GERD (gastroesophageal reflux disease) 06/03/2017  . Chronic pain of both knees 06/03/2017  . Chronic lower back pain 06/03/2017  . Type 2 diabetes mellitus without complication, without long-term current use of insulin ([place]) 04/12/2016    . Essential hypertension 04/12/2016    Past Surgical History:  Procedure Laterality Date  . BACK SURGERY  1993   lumbar  . BIOPSY  02/07/2019   Procedure: BIOPSY;  Surgeon: Danie Binder, MD;  Location: [facility];  Service: Endoscopy;;  . CERVICAL SPINE SURGERY  2000  . COLONOSCOPY N/A 02/07/2019   Dr. Oneida Alar: External hemorrhoids next colonoscopy in 10 years  . ESOPHAGOGASTRODUODENOSCOPY N/A 02/07/2019   Dr. Oneida Alar: Barrett's esophagus without dysplasia chronic inactive gastritis but no H. 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 ESOPHAGOGASTRODUODENOSCOPY N/A 01/05/2020   Procedure: ESOPHAGOGASTRODUODENOSCOPY (EGD);  Surgeon: Danie Binder, MD;  Location: [facility];  Service: Endoscopy;  Laterality: N/A;  10:30am  . HAND SURGERY    . SAVORY DILATION N/A 01/05/2020   Procedure: SAVORY DILATION;  Surgeon: Danie Binder, MD;  Location: [facility];  Service: Endoscopy;  Laterality: N/A;       Family History  Problem Relation Age of Onset  . Pancreatic cancer Mother   . CAD Father   . Colon cancer Paternal Grandfather     Social History   Tobacco Use  . Smoking status: Former Smoker    Quit date: 10/16/1988    Years since quitting: 31.7  . Smokeless tobacco: Never Used  Vaping Use  . Vaping Use: Never used  Substance Use Topics  . Alcohol use: No  . Drug use: No  Home Medications Prior to Admission medications   Medication Sig Start Date End Date Taking? Authorizing Provider  acetaminophen (TYLENOL) 500 MG tablet Take 1,000 mg by mouth in the morning and at bedtime.    Yes [provider]  Blood Glucose Monitoring Suppl (TRUE METRIX METER) w/Device KIT 1 each by Does not apply route 4 (four) times daily -  before meals and at bedtime. Patient taking differently: 1 each by Does not apply route 4 (four) times daily -  before meals and at bedtime. 1-2 times  per month 04/12/16  Yes Elsie Stain, MD  ferrous sulfate 325 (65 FE) MG tablet Take 1 tablet (325 mg total) by mouth daily with breakfast. 01/05/20  Yes [name], MD  glipiZIDE (GLUCOTROL) 5 MG tablet TAKE 1 TABLETS BY MOUTH IN THE MORNINGS AND 1 TABLET IN THE EVENINGS. 02/20/20  Yes [name], MD  glucose blood (TRUE METRIX BLOOD GLUCOSE TEST) test strip Use as instructed 02/10/19  Yes [name], MD  Liniments (BLUE-EMU SUPER STRENGTH EX) Apply 1 application topically daily as needed (pain).   Yes [provider]  lisinopril (ZESTRIL) 20 MG tablet Take 1.5 tablets (30 mg total) by mouth daily. 05/04/20  Yes [name], MD  loratadine (CLARITIN) 10 MG tablet Take 10 mg by mouth daily.   Yes [provider]  metFORMIN (GLUCOPHAGE) 1000 MG tablet TAKE 1/2 TABLET (500 MG TOTAL) BY MOUTH 2 (TWO) TIMES DAILY WITH A MEAL. Patient taking differently: Take 500 mg by mouth 2 (two) times daily with a meal.  05/17/20  Yes [name], MD  naproxen (NAPROSYN) 500 MG tablet TAKE 1 TABLET (500 MG TOTAL) BY MOUTH 2 (TWO) TIMES DAILY WITH A MEAL. Patient taking differently: Take 440 mg by mouth 2 (two) times daily with a meal.  08/04/19  Yes [name], MD  ondansetron (ZOFRAN ODT) 4 MG disintegrating tablet Take 1 tablet (4 mg total) by mouth every 8 (eight) hours as needed for up to 2 days for nausea or vomiting. 07/22/20 07/24/20 Yes Asencion Noble, MD  pantoprazole (PROTONIX) 20 MG tablet Take 1 tablet (20 mg total) by mouth daily. Patient taking differently: Take 40 mg by mouth daily. Takes 2 weekly 09/05/19  Yes [name], MD  pantoprazole (PROTONIX) 40 MG tablet TAKE 1 TABLET (40 MG TOTAL) BY MOUTH 2 (TWO) TIMES DAILY BEFORE A MEAL. 03/19/20  Yes [name], MD  Potassium 99 MG TABS Take 99 mg by mouth daily.    Yes [provider]  rOPINIRole (REQUIP) 0.5 MG tablet TAKE 1 TABLET (0.5 MG TOTAL) BY MOUTH AT BEDTIME. 03/19/20   Yes [name], MD  sucralfate (CARAFATE) 1 g tablet Take 1 tablet (1 g total) by mouth 4 (four) times daily. 07/01/20 09/29/20 Yes Carver, Elon Alas, DO  tamsulosin (FLOMAX) 0.4 MG CAPS capsule TAKE 2 CAPSULES (0.8 MG TOTAL) BY MOUTH DAILY. Patient taking differently: Take 0.8 mg by mouth every evening.  04/20/20  Yes [name], MD  terbinafine (LAMISIL) 250 MG tablet TAKE 1 TABLET (250 MG TOTAL) BY MOUTH DAILY. 07/13/20  Yes [name], MD  TRUEplus Lancets 28G MISC Check blood sugar fasting and before meals and again if pt feels bad (symptoms of hypo). 02/10/19  Yes [name], MD    Allergies    Cymbalta [duloxetine hcl], Lisinopril, Other, and Robaxin [methocarbamol]  Review of Systems   Review of Systems  All other  systems reviewed and are negative.   Physical Exam Updated Vital Signs BP (!) 139/59 (BP Location: Left Arm)   Pulse 68   Temp 98.4 F (36.9 C) (Oral)   Resp 17   SpO2 99%   Physical Exam Vitals and nursing note reviewed.  Constitutional:      General: He is not in acute distress.    Appearance: He is well-developed. He is not ill-appearing, toxic-appearing or diaphoretic.  HENT:     Head: Normocephalic and atraumatic.     Right Ear: External ear normal.     Left Ear: External ear normal.  Eyes:     Conjunctiva/sclera: Conjunctivae normal.     Pupils: Pupils are equal, round, and reactive to light.  Neck:     Trachea: Phonation normal.  Cardiovascular:     Rate and Rhythm: Normal rate and regular rhythm.     Heart sounds: Normal heart sounds.  Pulmonary:     Effort: Pulmonary effort is normal.     Breath sounds: Normal breath sounds.  Abdominal:     General: There is no distension.     Palpations: Abdomen is soft.     Tenderness: There is no abdominal tenderness.  Musculoskeletal:        General: Normal range of motion.     Cervical back: Normal range of motion and neck supple.  Skin:    General: Skin is warm and dry.    Neurological:     Mental Status: He is alert and oriented to person, place, and time.     Cranial Nerves: No cranial nerve deficit.     Sensory: No sensory deficit.     Motor: No abnormal muscle tone.     Coordination: Coordination normal.  Psychiatric:        Mood and Affect: Mood normal.        Behavior: Behavior normal.        Thought Content: Thought content normal.        Judgment: Judgment normal.     ED Results / Procedures / Treatments   Labs (all labs ordered are listed, but only abnormal results are displayed) Labs Reviewed  COMPREHENSIVE METABOLIC PANEL - Abnormal; Notable for the following components:      Result Value   Glucose, Bld 130 (*)    All other components within normal limits  LIPASE, BLOOD  CBC  URINALYSIS, ROUTINE W REFLEX MICROSCOPIC  TROPONIN I (HIGH SENSITIVITY)  TROPONIN I (HIGH SENSITIVITY)    EKG EKG Interpretation  Date/Time:  Friday July 23 2020 17:26:50 EDT Ventricular Rate:  58 PR Interval:  166 QRS Duration: 86 QT Interval:  386 QTC Calculation: 378 R Axis:   100 Text Interpretation: Sinus bradycardia Rightward axis Nonspecific ST abnormality Abnormal ECG since last tracing no significant change Confirmed by Daleen Bo [phone]) on 07/23/2020 5:41:51 PM   Radiology DG Chest 2 View  Result Date: 07/22/2020 CLINICAL DATA:  Chest pain EXAM: CHEST - 2 VIEW COMPARISON:  11/27/2017 FINDINGS: Surgical hardware in the cervical spine. No focal opacity or pleural effusion. Mild chronic bronchitic changes. Normal cardiomediastinal silhouette. No pneumothorax. IMPRESSION: No active cardiopulmonary disease. Electronically Signed   By: Donavan Foil M.D.   On: 07/22/2020 16:35    Procedures Procedures (including critical care time)  Medications Ordered in ED Medications  iohexol (OMNIPAQUE) 350 MG/ML injection 100 mL (has no administration in time range)  ondansetron (ZOFRAN) injection 4 mg (4 mg Intravenous Given 07/23/20 2116)  sodium  chloride 0.9 %  bolus 1,000 mL (0 mLs Intravenous Stopped 07/23/20 2256)    ED Course  I have reviewed the triage vital signs and the nursing notes.  Pertinent labs & imaging results that were available during my care of the patient were reviewed by me and considered in my medical decision making (see chart for details).  Clinical Course as of Jul 23 2344  Fri Jul 23, 2020  2103 Normal  Troponin I (High Sensitivity) [EW]  2103 Normal  Lipase, blood [EW]  2103 Normal  CBC [EW]  2103 Normal except glucose high  Comprehensive metabolic panel(!) [EW]  1308 nRBC: 0.0 [EW]  2343 Initial results reassuring, urine pending   [EW]    Clinical Course User Index [EW] Daleen Bo, MD   MDM Rules/Calculators/A&P                           Patient Vitals for the past 24 hrs:  BP Temp Temp src Pulse Resp SpO2  07/23/20 2230 (!) 139/59 -- -- 68 17 99 %  07/23/20 2100 132/61 98.4 F (36.9 C) Oral 65 14 98 %  07/23/20 1736 139/62 98.2 F (36.8 C) -- 63 18 99 %      Medical Decision Making:  This patient is presenting for evaluation of abdominal discomfort and ongoing vomiting for 3 days, which does require a range of treatment options, and is a complaint that involves a moderate risk of morbidity and mortality. The differential diagnoses include gastritis, enteritis, acute intra-abdominal disorders. I decided to review old records, and in summary patient comprehensively evaluated yesterday at another ED, presenting now with same symptoms..  I did not require additional historical information from anyone.  Clinical Laboratory Tests Ordered, included CBC, Metabolic panel, Urinalysis and Troponin testing. Review indicates initial results reassuring, urine pending. Radiologic Tests Ordered, included CT abdomen pelvis.     Critical Interventions-clinical evaluation, laboratory testing, CT imaging  Disposition will be by Dr. Maurie Boettcher following return of CT and urine testing  CRITICAL  CARE-no Performed by: Daleen Bo  Nursing Notes Reviewed/ Care Coordinated Applicable Imaging Reviewed Interpretation of Laboratory Data incorporated into ED treatment     Final Clinical Impression(s) / ED Diagnoses Final diagnoses:  Generalized abdominal pain  Non-intractable vomiting with nausea, unspecified vomiting type    Rx / DC Orders ED Discharge Orders    None       Daleen Bo, MD 07/23/20 2345

## 2020-07-23 NOTE — ED Triage Notes (Signed)
C/o abdominal pain and pain in chest, advised to come in by his doctor. Seen at [facility] for same yesterday, states they ruled out a cardiac event and sent him home. States he has had symptoms for the past 3 days, unable to keep anything on stomach.

## 2020-07-23 NOTE — ED Notes (Signed)
PT resting on stretcher rails up x 2 call light in hand. PT remains a/o x 4 skin warm dry intact. Pt VSS NAD PT was able to self ambulate to restroom but unable to provide UA at this time. PT continues on cardiac monitor and room air, family at bedside.

## 2020-07-24 LAB — URINALYSIS, ROUTINE W REFLEX MICROSCOPIC
Bilirubin Urine: NEGATIVE
Glucose, UA: 50 mg/dL — AB
Hgb urine dipstick: NEGATIVE
Ketones, ur: NEGATIVE mg/dL
Nitrite: NEGATIVE
Protein, ur: NEGATIVE mg/dL
Specific Gravity, Urine: >1.046 — ABNORMAL HIGH (ref 1.005–1.030)
WBC, UA: >50 WBC/hpf — ABNORMAL HIGH (ref 0–5)
pH: 6 (ref 5.0–8.0)

## 2020-07-24 NOTE — ED Notes (Signed)
PT educated with DC instructions and verbalized complete understanding, denies questions at this time. Pt Iv removed fully intact and dressing applied. Pt self ambulated to exit with steady gait.

## 2020-07-24 NOTE — Discharge Instructions (Addendum)
You were evaluated in the Emergency Department and after careful evaluation, we did not find any emergent condition requiring admission or further testing in the hospital.  Your exam/testing today was overall reassuring.  Your symptoms seem to be due to a viral gastroenteritis.  Please take the prescribed nausea medicine and drink plenty of fluids.  Please return to the Emergency Department if you experience any worsening of your condition.  Thank you for allowing [name] to be a part of your care.

## 2020-07-24 NOTE — ED Notes (Signed)
PT to attempt to provide UA. Pt educated on need to complete urinary cath if unable to provide UA. Pt verbalized complete understanding.

## 2020-07-24 NOTE — ED Provider Notes (Signed)
  Provider Note MRN:  [id number]  Arrival date & time: 07/24/20    ED Course and Medical Decision Making  Assumed care from Dr. Effie Shy at shift change.  Patient is well-appearing and CT is overall reassuring, likely infectious enteritis.  Advised fluids, nausea medicine at home.  Patient is requesting discharge, tolerating p.o.  Will follow up with PCP.  Procedures  Final Clinical Impressions(s) / ED Diagnoses     ICD-10-CM   1. Generalized abdominal pain  R10.84   2. Non-intractable vomiting with nausea, unspecified vomiting type  R11.2     ED Discharge Orders    None        Discharge Instructions     You were evaluated in the Emergency Department and after careful evaluation, we did not find any emergent condition requiring admission or further testing in the hospital.  Your exam/testing today was overall reassuring.  Your symptoms seem to be due to a viral gastroenteritis.  Please take the prescribed nausea medicine and drink plenty of fluids.  Please return to the Emergency Department if you experience any worsening of your condition.  Thank you for allowing [name] to be a part of your care.     Elmer Sow. Pilar Plate, MD [facility] Medicine [facility] [email or web address]    Sabas Sous, MD 07/24/20 [phone]

## 2020-07-26 MED FILL — LISINOPRIL 20 MG TABLET: 20 | 30 days supply | Qty: 45 | Fill #2

## 2020-07-28 ENCOUNTER — Other Ambulatory Visit: Payer: Self-pay | Admitting: Critical Care Medicine

## 2020-07-28 ENCOUNTER — Encounter: Payer: Self-pay | Admitting: Internal Medicine

## 2020-07-28 DIAGNOSIS — E119 Type 2 diabetes mellitus without complications: Secondary | ICD-10-CM

## 2020-07-28 MED FILL — TRUE METRIX TEST STRIP: 25 days supply | Qty: 100 | Fill #0

## 2020-07-29 ENCOUNTER — Other Ambulatory Visit: Payer: Self-pay | Admitting: Internal Medicine

## 2020-07-29 MED FILL — TRUEplus LANCETS 28G MISC: 25 days supply | Qty: 100 | Fill #0

## 2020-07-30 MED FILL — ?ROPINIROLE HCL 0.5MG TAB: 0.5 | 30 days supply | Qty: 30 | Fill #4

## 2020-08-02 MED FILL — TAMSULOSIN HCL 0.4 MG CAP: 0.4 | 30 days supply | Qty: 60 | Fill #3

## 2020-08-13 ENCOUNTER — Telehealth: Payer: Self-pay | Admitting: Internal Medicine

## 2020-08-13 MED FILL — TRUEplus LANCETS 28G MISC: 25 days supply | Qty: 100 | Fill #0

## 2020-08-13 MED FILL — TAMSULOSIN HCL 0.4 MG CAP: 0.4 | 30 days supply | Qty: 60 | Fill #3

## 2020-08-13 NOTE — Telephone Encounter (Signed)
Pt call to speak with the person that order the Cpap machine since he need supplies, he was inform that the Case manager will call him back

## 2020-08-13 NOTE — Telephone Encounter (Signed)
Copied from CRM [id number]. Topic: General - Inquiry >> Aug 13, 2020 12:53 PM Adrian Prince D wrote: Reason for CRM: Patient called requesting a call back from [facility], in reference to his orange card. He can be reached at [phone]. Please advise

## 2020-08-13 NOTE — Telephone Encounter (Signed)
I call back the Pt, spoke with him an explain him that his cafa exp 08/28/20

## 2020-08-16 MED FILL — ?ROPINIROLE HCL 0.5MG TAB: 0.5 | 30 days supply | Qty: 30 | Fill #4

## 2020-08-16 MED FILL — ?GLIPIZIDE 5MG TABLET: 5 | 30 days supply | Qty: 60 | Fill #5

## 2020-08-16 NOTE — Telephone Encounter (Signed)
Call returned to the patient.  He said that he understands he needs to purchase the CPAP mask. He has already contacted [employer] who provided his CPAP machine and supplies. He stated that [employer] told him that the credit card they have on file is from [facility]. This CM explained that he can contact [employer] and use his credit card for the mask purchase.   Call placed to [employer], spoke to [place] who stated that this CM would need to speak to billing about removing the [facility] credit card information. She took message to have billing return a call to this CM.  She also twice transferred this call to billing department and both times this CM attempted to leave a message and the call was dropped.   SECURE email and voicemail message sent to [facility] requesting assistance with removing the [facility] credit card information

## 2020-08-17 ENCOUNTER — Telehealth: Payer: Self-pay

## 2020-08-17 NOTE — Telephone Encounter (Signed)
Call received from [facility].  Informed her of the need to remove [facility] credit card from patient's account and she said she would follow up with finance/billing  department to have it removed

## 2020-08-30 ENCOUNTER — Other Ambulatory Visit: Payer: Self-pay | Admitting: Internal Medicine

## 2020-08-30 ENCOUNTER — Ambulatory Visit: Payer: Self-pay

## 2020-08-30 ENCOUNTER — Ambulatory Visit (HOSPITAL_BASED_OUTPATIENT_CLINIC_OR_DEPARTMENT_OTHER): Payer: Self-pay | Admitting: Internal Medicine

## 2020-08-30 ENCOUNTER — Other Ambulatory Visit: Payer: Self-pay

## 2020-08-30 DIAGNOSIS — I2584 Coronary atherosclerosis due to calcified coronary lesion: Secondary | ICD-10-CM

## 2020-08-30 DIAGNOSIS — G5603 Carpal tunnel syndrome, bilateral upper limbs: Secondary | ICD-10-CM

## 2020-08-30 DIAGNOSIS — E1142 Type 2 diabetes mellitus with diabetic polyneuropathy: Secondary | ICD-10-CM

## 2020-08-30 DIAGNOSIS — I700 Atherosclerosis of aorta: Secondary | ICD-10-CM

## 2020-08-30 DIAGNOSIS — M25551 Pain in right hip: Secondary | ICD-10-CM

## 2020-08-30 DIAGNOSIS — I2510 Atherosclerotic heart disease of native coronary artery without angina pectoris: Secondary | ICD-10-CM

## 2020-08-30 MED ORDER — TRAMADOL HCL 50 MG PO TABS: 50.0000 mg | ORAL_TABLET | Freq: Three times a day (TID) | ORAL | 0 refills | Status: DC | PRN

## 2020-08-30 MED ORDER — ATORVASTATIN CALCIUM 10 MG PO TABS: 10.0000 mg | ORAL_TABLET | Freq: Every day | ORAL | 3 refills | Status: DC

## 2020-08-30 MED FILL — LISINOPRIL 20 MG TABLET: 20 | 30 days supply | Qty: 45 | Fill #3

## 2020-08-30 MED FILL — ?ATORVASTATIN 10 MG TABLET: 10 | 30 days supply | Qty: 30 | Fill #0

## 2020-08-30 MED FILL — traMADol HCL 50 MG TABS: 50 | 10 days supply | Qty: 30 | Fill #0

## 2020-08-30 NOTE — Progress Notes (Signed)
Virtual Visit via Telephone Note Due to current restrictions/limitations of in-office visits due to the COVID-19 pandemic, this scheduled clinical appointment was converted to a telehealth visit  I connected with Jeremy [name] on 08/30/20 at 11:21 a.m by telephone and verified that I am speaking with the correct person using two identifiers.  Location: Patient: home Provider: office   I discussed the limitations, risks, security and privacy concerns of performing an evaluation and management service by telephone and the availability of in person appointments. I also discussed with the patient that there may be a patient responsible charge related to this service. The patient expressed understanding and agreed to proceed.   History of Present Illness: Pt with hx of DM, HL, HTN,GERD,IDA(Barrett's esophagus and moderate gastritis thought to be NSAID induced on EGD),pyloric stenosis,chronic LBP, ED, OSA, RLS.  Thinks Flomax was causing numbness in his hands.  Numbness in hands for mths.  Dose of Flomax double about 3 mths ago.  Ran out of it for 10 days.  Did not notice numbness had gone away until he restarted the med and numbness reappeared. Also the med caused delayed ejaculation for him  C/o RT hip pain from waistline around the leg to anterior knee.  Present x 1.5 mths but more unbearable in last 2 wks.  Worse with walking.  Rates pain 9/10.  No numbness "but sometimes it feels like it's not going to hold me up."  Difficulty climbing steps; he has to step up with left leg first.   No skin discoloration.  Moving bowel and urine okay.  Takes Tylenol 1 gram 3-4 x a day.  Also takes 2 Aleves 2-3 times a day  Acute GI symptoms that he was having on last visit and for which he was seen in the emergency room have resolved.   No further CP.  However CAT scan of the abdomen showed incidental findings of aortic atherosclerosis and extensive calcification of the coronary arteries.  DM: has not  check BS in few wks.  Last readings were around 140. Last A1c done in August was 6.5.  He feels he is doing okay with his eating habits.  Outpatient Encounter Medications as of 08/30/2020  Medication Sig  . acetaminophen (TYLENOL) 500 MG tablet Take 1,000 mg by mouth in the morning and at bedtime.   . Blood Glucose Monitoring Suppl (TRUE METRIX METER) w/Device KIT 1 each by Does not apply route 4 (four) times daily -  before meals and at bedtime. (Patient taking differently: 1 each by Does not apply route 4 (four) times daily -  before meals and at bedtime. 1-2 times per month)  . ferrous sulfate 325 (65 FE) MG tablet Take 1 tablet (325 mg total) by mouth daily with breakfast.  . glipiZIDE (GLUCOTROL) 5 MG tablet TAKE 1 TABLETS BY MOUTH IN THE MORNINGS AND 1 TABLET IN THE EVENINGS.  Marland Kitchen glucose blood test strip Use as instructed  . Liniments (BLUE-EMU SUPER STRENGTH EX) Apply 1 application topically daily as needed (pain).  Marland Kitchen lisinopril (ZESTRIL) 20 MG tablet Take 1.5 tablets (30 mg total) by mouth daily.  Marland Kitchen loratadine (CLARITIN) 10 MG tablet Take 10 mg by mouth daily.  . metFORMIN (GLUCOPHAGE) 1000 MG tablet TAKE 1/2 TABLET (500 MG TOTAL) BY MOUTH 2 (TWO) TIMES DAILY WITH A MEAL. (Patient taking differently: Take 500 mg by mouth 2 (two) times daily with a meal. )  . naproxen (NAPROSYN) 500 MG tablet TAKE 1 TABLET (500 MG TOTAL) BY MOUTH  2 (TWO) TIMES DAILY WITH A MEAL. (Patient taking differently: Take 440 mg by mouth 2 (two) times daily with a meal. )  . pantoprazole (PROTONIX) 20 MG tablet Take 1 tablet (20 mg total) by mouth daily. (Patient taking differently: Take 40 mg by mouth daily. Takes 2 weekly)  . pantoprazole (PROTONIX) 40 MG tablet TAKE 1 TABLET (40 MG TOTAL) BY MOUTH 2 (TWO) TIMES DAILY BEFORE A MEAL.  Marland Kitchen Potassium 99 MG TABS Take 99 mg by mouth daily.   Marland Kitchen rOPINIRole (REQUIP) 0.5 MG tablet TAKE 1 TABLET (0.5 MG TOTAL) BY MOUTH AT BEDTIME.  . sucralfate (CARAFATE) 1 g tablet Take 1  tablet (1 g total) by mouth 4 (four) times daily.  . tamsulosin (FLOMAX) 0.4 MG CAPS capsule TAKE 2 CAPSULES (0.8 MG TOTAL) BY MOUTH DAILY. (Patient taking differently: Take 0.8 mg by mouth every evening. )  . terbinafine (LAMISIL) 250 MG tablet TAKE 1 TABLET (250 MG TOTAL) BY MOUTH DAILY.  . TRUEplus Lancets 28G MISC CHECK BLOOD SUGAR FASTING AND BEFORE MEALS AND AGAIN IF PT FEELS BAD (SYMPTOMS OF HYPO).   No facility-administered encounter medications on file as of 08/30/2020.     Observations/Objective: Results for orders placed or performed during the hospital encounter of 07/23/20  Lipase, blood  Result Value Ref Range   Lipase 31 11 - 51 U/L  Comprehensive metabolic panel  Result Value Ref Range   Sodium 137 135 - 145 mmol/L   Potassium 3.6 3.5 - 5.1 mmol/L   Chloride 98 98 - 111 mmol/L   CO2 28 22 - 32 mmol/L   Glucose, Bld 130 (H) 70 - 99 mg/dL   BUN 17 6 - 20 mg/dL   Creatinine, Ser 0.91 0.61 - 1.24 mg/dL   Calcium 9.7 8.9 - 10.3 mg/dL   Total Protein 8.0 6.5 - 8.1 g/dL   Albumin 4.3 3.5 - 5.0 g/dL   AST 29 15 - 41 U/L   ALT 26 0 - 44 U/L   Alkaline Phosphatase 54 38 - 126 U/L   Total Bilirubin 0.7 0.3 - 1.2 mg/dL   GFR, Estimated >60 >60 mL/min   Anion gap 11 5 - 15  CBC  Result Value Ref Range   WBC 10.4 4.0 - 10.5 K/uL   RBC 5.09 4.22 - 5.81 MIL/uL   Hemoglobin 14.9 13.0 - 17.0 g/dL   HCT 46.4 39 - 52 %   MCV 91.2 80.0 - 100.0 fL   MCH 29.3 26.0 - 34.0 pg   MCHC 32.1 30.0 - 36.0 g/dL   RDW 12.9 11.5 - 15.5 %   Platelets 215 150 - 400 K/uL   nRBC 0.0 0.0 - 0.2 %  Urinalysis, Routine w reflex microscopic  Result Value Ref Range   Color, Urine YELLOW YELLOW   APPearance CLOUDY (A) CLEAR   Specific Gravity, Urine >1.046 (H) 1.005 - 1.030   pH 6.0 5.0 - 8.0   Glucose, UA 50 (A) NEGATIVE mg/dL   Hgb urine dipstick NEGATIVE NEGATIVE   Bilirubin Urine NEGATIVE NEGATIVE   Ketones, ur NEGATIVE NEGATIVE mg/dL   Protein, ur NEGATIVE NEGATIVE mg/dL   Nitrite  NEGATIVE NEGATIVE   Leukocytes,Ua LARGE (A) NEGATIVE   RBC / HPF 21-50 0 - 5 RBC/hpf   WBC, UA >50 (H) 0 - 5 WBC/hpf   Bacteria, UA RARE (A) NONE SEEN   Squamous Epithelial / LPF 21-50 0 - 5   Mucus PRESENT   Troponin I (High Sensitivity)  Result Value  Ref Range   Troponin I (High Sensitivity) 5 <18 ng/L  Troponin I (High Sensitivity)  Result Value Ref Range   Troponin I (High Sensitivity) 5 <18 ng/L   Lab Results  Component Value Date   HGBA1C 6.5 06/07/2020     Assessment and Plan: 1. Type 2 diabetes mellitus with diabetic polyneuropathy, without long-term current use of insulin ([place]) 2. Bilateral carpal tunnel syndrome Patient reports that numbness in his hands resolved when he was off Flomax.  He is also reporting problems with delayed ejaculation.  The latter is a side effect of Flomax but not so much the formal.  I told him to stop the Flomax.  3. Hip pain, right Questionable etiology.  I recommend that we get an x-ray of the right hip to evaluate this further.  Advised to stop Aleve given his history of gastritis that was thought to be induced by NSAIDs.  Advised that I will give a limited supply of tramadol to use as needed.  Informed that the tramadol is a controlled substance that can cause drowsiness.  Advised not to share his medication with others.  [place] reviewed. - DG Hip Unilat W OR W/O Pelvis Min 4 Views Right; Future - traMADol (ULTRAM) 50 MG tablet; Take 1 tablet (50 mg total) by mouth every 8 (eight) hours as needed.  Dispense: 30 tablet; Refill: 0  4. Aortic atherosclerosis ([place]) 5. Coronary artery calcification Recommend starting statin therapy.  Last LDL done earlier this year was 24.  Also advised taking a baby aspirin daily.  Both the aspirin and statin therapy will help to prevent acute cardiovascular events.  We will refer him to cardiology given findings on CT scan that showed extensive calcification of the coronary arteries. - Ambulatory referral to  Cardiology - atorvastatin (LIPITOR) 10 MG tablet; Take 1 tablet (10 mg total) by mouth daily.  Dispense: 30 tablet; Refill: 3   Follow Up Instructions: 4 mths   I discussed the assessment and treatment plan with the patient. The patient was provided an opportunity to ask questions and all were answered. The patient agreed with the plan and demonstrated an understanding of the instructions.   The patient was advised to call back or seek an in-person evaluation if the symptoms worsen or if the condition fails to improve as anticipated.  I provided 19 minutes of non-face-to-face time during this encounter.   Karle Plumber, MD

## 2020-08-30 NOTE — Progress Notes (Signed)
Pt states he ran out of flomax and he has been having issues with numbness in his hands   Pt states when he not on the flomax he doesn't have numbness in his hands

## 2020-09-12 ENCOUNTER — Encounter: Payer: Self-pay | Admitting: Internal Medicine

## 2020-09-13 ENCOUNTER — Other Ambulatory Visit: Payer: Self-pay | Admitting: Internal Medicine

## 2020-09-13 DIAGNOSIS — M25551 Pain in right hip: Secondary | ICD-10-CM

## 2020-09-13 MED ORDER — ROPINIROLE HCL 0.5 MG PO TABS
0.5000 mg | ORAL_TABLET | Freq: Every day | ORAL | 1 refills | Status: DC
Start: 2020-09-13 — End: 2020-09-13

## 2020-09-13 MED FILL — ?GLIPIZIDE 5MG TABLET: 5 | 30 days supply | Qty: 60 | Fill #6

## 2020-09-13 NOTE — Telephone Encounter (Signed)
Requested medication (s) are due for refill today: Yes  Requested medication (s) are on the active medication list: Yes  Last refill:  08/30/20  Future visit scheduled: Yes  Notes to clinic:  Unable to refill per protocol, cannot delegate     Requested Prescriptions  Pending Prescriptions Disp Refills   traMADol (ULTRAM) 50 MG tablet [Pharmacy Med Name: traMADol HCL 50 MG TABS 50 Tablet] 30 tablet 0    Sig: TAKE 1 TABLET (50 MG TOTAL) BY MOUTH EVERY 8 (EIGHT) HOURS AS NEEDED.      Not Delegated - Analgesics:  Opioid Agonists Failed - 09/13/2020  4:22 PM      Failed - This refill cannot be delegated      Failed - Urine Drug Screen completed in last 360 days      Passed - Valid encounter within last 6 months    Recent Outpatient Visits           2 weeks ago Type 2 diabetes mellitus with diabetic polyneuropathy, without long-term current use of insulin (HCC)   [place] [facility] Marcine Matar, MD   1 month ago Acute abdominal pain   [place] [facility] Jonah Blue B, MD   3 months ago Type 2 diabetes mellitus with diabetic polyneuropathy, without long-term current use of insulin (HCC)   [place] [facility] Jonah Blue B, MD   7 months ago Type 2 diabetes mellitus without complication, without long-term current use of insulin (HCC)   [place] [facility] Marcine Matar, MD   9 months ago OSA (obstructive sleep apnea)   [place] [facility] Marcine Matar, MD       Future Appointments             In 1 week Shari Prows, Kathlynn Grate, MD [facility] [address] Office, [facility]   In 3 months Marcine Matar, MD [facility]             Signed Prescriptions Disp Refills   rOPINIRole (REQUIP) 0.5 MG tablet 90 tablet 1    Sig: TAKE 1 TABLET (0.5 MG TOTAL) BY MOUTH AT BEDTIME.      Neurology:  Parkinsonian  Agents Passed - 09/13/2020  4:22 PM      Passed - Last BP in normal range    BP Readings from Last 1 Encounters:  07/24/20 124/61          Passed - Valid encounter within last 12 months    Recent Outpatient Visits           2 weeks ago Type 2 diabetes mellitus with diabetic polyneuropathy, without long-term current use of insulin (HCC)   [place] [facility] Marcine Matar, MD   1 month ago Acute abdominal pain   [place] [facility] Jonah Blue B, MD   3 months ago Type 2 diabetes mellitus with diabetic polyneuropathy, without long-term current use of insulin [facility])   [place] [facility] Jonah Blue B, MD   7 months ago Type 2 diabetes mellitus without complication, without long-term current use of insulin [facility])   [place] [facility] Marcine Matar, MD   9 months ago OSA (obstructive sleep apnea)   [facility] Marcine Matar, MD       Future Appointments  In 1 week [place], Kathlynn Grate, MD [facility] [address] Office, [facility]   In 3 months Laural Benes, Binnie Rail, MD [facility]

## 2020-09-14 MED FILL — ?ROPINIROLE HCL 0.5MG TAB: 0.5 | 30 days supply | Qty: 30 | Fill #0

## 2020-09-15 ENCOUNTER — Other Ambulatory Visit: Payer: Self-pay

## 2020-09-15 ENCOUNTER — Ambulatory Visit (HOSPITAL_COMMUNITY)
Admission: RE | Admit: 2020-09-15 | Discharge: 2020-09-15 | Disposition: A | Discharge status: Home / Self Care | Payer: Self-pay | Source: Ambulatory Visit | Attending: Internal Medicine | Admitting: Internal Medicine

## 2020-09-15 ENCOUNTER — Other Ambulatory Visit: Payer: Self-pay | Admitting: Internal Medicine

## 2020-09-15 DIAGNOSIS — M25551 Pain in right hip: Secondary | ICD-10-CM

## 2020-09-17 ENCOUNTER — Encounter: Payer: Self-pay | Admitting: Internal Medicine

## 2020-09-18 NOTE — Progress Notes (Signed)
Cardiology Office Note:    Date:  09/20/2020   ID:  Jeremy [name], DOB 1961/02/06, MRN [id number]  PCP:  [name], MD  [facility] Cardiologist:  Freada Bergeron, MD  [facility] Electrophysiologist:  None   Referring MD: [name], MD    History of Present Illness:    Jeremy [name] is a 59 y.o. male with a hx of HTN, HLD, DMII, OSA, aortic atherosclerosis who was referred by Dr. Wynetta Emery for further evaluation of coronary calcification.  Patient presented to the ED in 07/2020 for abdominal and vomiting. Trop was negative, BNP normal. ECG with NSR, non-specific ST changes.  CT abdomen pelvis noted to have extensive multi-vessel coronary artery calcification. He was started on ASA and statin and referred to Cardiology for further management.  Patient states that he has occasional chest discomfort and shortness of breath when walking an incline. Symptoms resolve with rest. Occurs almost every time he exerts himself and goes away after a while with rest. No nausea, vomiting. Has mild LE edema at the end of the day. No orthopnea. No lightheadedness, dizziness or syncope. No bleeding issues that he knows of. Has esophageal strictures s/p dilation of one (has one that is residual). Symptoms of GERD have improved especially since starting CPAP for OSA.  Family history: Father with CAD with MI and passed away in 15s. Mother deceased from pancreatic cancer. Brother passed away with COPD. Brother passed away from COVID-19. Brother with testicular cancer (still alive)  HgB A1C and 6.5.  Past Medical History:  Diagnosis Date  . Diabetes mellitus without complication ([place])   . GERD (gastroesophageal reflux disease)   . Hyperlipidemia   . Hypertension   . IDA (iron deficiency anemia)   . Rash of entire body 03/2016    Past Surgical History:  Procedure Laterality Date  . BACK SURGERY  1993   lumbar  . BIOPSY  02/07/2019   Procedure: BIOPSY;  Surgeon: Danie Binder, MD;  [facility];  Service: Endoscopy;;  . CERVICAL SPINE SURGERY  2000  . COLONOSCOPY N/A 02/07/2019   Dr. Oneida Alar: External hemorrhoids next colonoscopy in 10 years  . ESOPHAGOGASTRODUODENOSCOPY N/A 02/07/2019   Dr. Oneida Alar: Barrett's esophagus without dysplasia chronic inactive gastritis but no H. 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 ESOPHAGOGASTRODUODENOSCOPY N/A 01/05/2020   Procedure: ESOPHAGOGASTRODUODENOSCOPY (EGD);  Surgeon: Danie Binder, MD;  [facility];  Service: Endoscopy;  Laterality: N/A;  10:30am  . HAND SURGERY    . SAVORY DILATION N/A 01/05/2020   Procedure: SAVORY DILATION;  Surgeon: Danie Binder, MD;  [facility];  Service: Endoscopy;  Laterality: N/A;    Current Medications: Current Meds  Medication Sig  . acetaminophen (TYLENOL) 500 MG tablet Take 1,000 mg by mouth in the morning and at bedtime.   . Blood Glucose Monitoring Suppl (TRUE METRIX METER) w/Device KIT 1 each by Does not apply route 4 (four) times daily -  before meals and at bedtime.  . ferrous sulfate 325 (65 FE) MG tablet Take 1 tablet (325 mg total) by mouth daily with breakfast.  . glipiZIDE (GLUCOTROL) 5 MG tablet TAKE 1 TABLETS BY MOUTH IN THE MORNINGS AND 1 TABLET IN THE EVENINGS.  Marland Kitchen glucose blood test strip Use as instructed  . Liniments (BLUE-EMU SUPER STRENGTH EX) Apply 1 application topically daily as needed (pain).  Marland Kitchen loratadine (  CLARITIN) 10 MG tablet Take 10 mg by mouth daily.  . pantoprazole (PROTONIX) 40 MG tablet TAKE 1 TABLET (40 MG TOTAL) BY MOUTH 2 (TWO) TIMES DAILY BEFORE A MEAL.  . pantoprazole (PROTONIX) 40 MG tablet Take 40 mg by mouth 3 (three) times a week.  . Potassium 99 MG TABS Take 99 mg by mouth daily.   Marland Kitchen rOPINIRole (REQUIP) 0.5 MG tablet Take 1 tablet (0.5 mg total) by mouth at bedtime.  . TRUEplus Lancets 28G MISC CHECK BLOOD SUGAR FASTING  AND BEFORE MEALS AND AGAIN IF PT FEELS BAD (SYMPTOMS OF HYPO).  . [DISCONTINUED] atorvastatin (LIPITOR) 10 MG tablet Take 1 tablet (10 mg total) by mouth daily.  . [DISCONTINUED] lisinopril (ZESTRIL) 20 MG tablet Take 1.5 tablets (30 mg total) by mouth daily.     Allergies:   Cymbalta [duloxetine hcl], Flomax [tamsulosin], Lisinopril, Other, and Robaxin [methocarbamol]   Social History   Socioeconomic History  . Marital status: Significant Other    Spouse name: Not on file  . Number of children: Not on file  . Years of education: Not on file  . Highest education level: Not on file  Occupational History  . Occupation: Clinical biochemist  Tobacco Use  . Smoking status: Former Smoker    Quit date: 10/16/1988    Years since quitting: 31.9  . Smokeless tobacco: Never Used  Vaping Use  . Vaping Use: Never used  Substance and Sexual Activity  . Alcohol use: No  . Drug use: No  . Sexual activity: Not on file  Other Topics Concern  . Not on file  Social History Narrative   Volunteers as Airline pilot   Social Determinants of Health   Financial Resource Strain:   . Difficulty of Paying Living Expenses: Not on file  Food Insecurity:   . Worried About Charity fundraiser in the Last Year: Not on file  . Ran Out of Food in the Last Year: Not on file  Transportation Needs:   . Lack of Transportation (Medical): Not on file  . Lack of Transportation (Non-Medical): Not on file  Physical Activity:   . Days of Exercise per Week: Not on file  . Minutes of Exercise per Session: Not on file  Stress:   . Feeling of Stress : Not on file  Social Connections:   . Frequency of Communication with Friends and Family: Not on file  . Frequency of Social Gatherings with Friends and Family: Not on file  . Attends Religious Services: Not on file  . Active Member of Clubs or Organizations: Not on file  . Attends Archivist Meetings: Not on file  . Marital Status: Not on file     Family  History: The patient's family history includes CAD in his father; Colon cancer in his paternal grandfather; Pancreatic cancer in his mother.  ROS:   Please see the history of present illness.    Review of Systems  Constitutional: Negative for chills and fever.  HENT: Negative for hearing loss.   Eyes: Negative for blurred vision.  Respiratory: Positive for shortness of breath.   Cardiovascular: Positive for chest pain and leg swelling. Negative for palpitations, orthopnea, claudication and PND.  Gastrointestinal: Negative for abdominal pain, nausea and vomiting.  Genitourinary: Negative for flank pain.  Musculoskeletal: Positive for joint pain.  Neurological: Negative for dizziness and loss of consciousness.  Endo/Heme/Allergies: Negative for polydipsia.  Psychiatric/Behavioral: Negative for substance abuse.    EKGs/Labs/Other Studies Reviewed:    The  following studies were reviewed today: CT abdomen/pelvis: FINDINGS: Lower chest: The visualized lung bases are clear bilaterally. Extensive multi-vessel coronary artery calcification. Global cardiac size is within normal limits.  Hepatobiliary: No focal liver abnormality is seen. No gallstones, gallbladder wall thickening, or biliary dilatation.  Pancreas: Unremarkable  Spleen: Unremarkable  Adrenals/Urinary Tract: The adrenal glands are unremarkable. Punctate 1-2 mm nonobstructing calculi are seen within the lower pole of the kidneys bilaterally. Simple cortical cyst noted within the lower pole of the right kidney. The kidneys are otherwise unremarkable. The bladder is unremarkable.  Stomach/Bowel: There is periluminal inflammatory stranding and hyperemia involving the terminal gastric antrum, pylorus, and duodenal bulb in keeping with an infectious or inflammatory antritis/duodenitis, as can be seen with caustic ingestion, drug toxicity, Helicobacter pylori infection, and hyper secretory conditions. No evidence of  perforation. No obstruction. The small and large bowel are unremarkable. Appendix normal. No free intraperitoneal gas or fluid.  Vascular/Lymphatic: Mild aortoiliac atherosclerotic calcification without evidence of aneurysm. No pathologic adenopathy within the abdomen and pelvis.  Reproductive: Prostate is unremarkable.  Other: Rectum unremarkable  Musculoskeletal: Degenerative changes seen within the lumbar spine with calcified broad-based posterior disc bulges throughout the lumbar spine resulting in multilevel central canal stenosis. No acute bone abnormality.  IMPRESSION: Inflammatory changes in keeping with infectious or inflammatory gastritis/duodenitis. No evidence of obstruction or perforation. Correlation with endoscopy would be helpful for further evaluation.  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 This was better assessed on prior MRI examination of 01/13/2017.  EKG:  EKG with NSR with non-specific ST-changes on 07/26/20  Recent Labs: 07/22/2020: B Natriuretic Peptide 73.5 07/23/2020: ALT 26; BUN 17; Creatinine, Ser 0.91; Hemoglobin 14.9; Platelets 215; Potassium 3.6; Sodium 137  Recent Lipid Panel    Component Value Date/Time   CHOL 150 02/05/2020 1442   TRIG 168 (H) 02/05/2020 1442   HDL 40 02/05/2020 1442   CHOLHDL 3.8 02/05/2020 1442   CHOLHDL 4.8 10/17/2016 1012   VLDL NOT CALC 10/17/2016 1012   LDLCALC 81 02/05/2020 1442      Physical Exam:    VS:  BP 140/70   Pulse 64   Ht 5' 10"  (1.778 m)   Wt 232 lb 9.6 oz (105.5 kg)   SpO2 96%   BMI 33.37 kg/m     Wt Readings from Last 3 Encounters:  09/20/20 232 lb 9.6 oz (105.5 kg)  07/22/20 234 lb 6.4 oz (106.3 kg)  07/01/20 229 lb (103.9 kg)     GEN: Well nourished, well developed in no acute distress HEENT: Normal NECK: No JVD; No carotid bruits CARDIAC: UVO,5/3 systolic mumur, no rubs, gallops RESPIRATORY:  Clear to  auscultation without rales, wheezing or rhonchi  ABDOMEN: Soft, non-tender, non-distended MUSCULOSKELETAL:  No edema; No deformity  SKIN: Warm and dry NEUROLOGIC:  Alert and oriented x 3 PSYCHIATRIC:  Normal affect   ASSESSMENT:    1. Coronary artery disease of native artery of native heart with stable angina pectoris ([place])   2. Primary hypertension   3. Pure hypercholesterolemia   4. Type 2 diabetes mellitus without complication, without long-term current use of insulin (HCC)    PLAN:    In order of problems listed above:  #Chest Pain #Multivessel CAD on CT chest: Patient with exertional angina and dyspnea that is relieved with rest. Symptoms occur with slight incline or walking stairs. Have been getting worse since his daughter passed away in 18-Apr-2023. CT abdomen/pelvis with extensive multivessel coronary  calcification. Given symptoms and CT findings, will proceed with coronary angiography -Plan for coronary angiography  -Check TTE -Start ASA 59m daily -Increase lipitor 86mdaily -Start coreg 3.12546mID -Increase lisinopril to 34m55mily  #HTN Elevated at 140s today. Goal <120s/80s -Increase lisinopril to 34mg78mly -Start coreg 3.125mg 44m #HLD -Increase atorvastatin to 80mg d68m -Repeat cholesterol in 6 weeks  #DMII Well controlled. HgA1C 6.5 -Continue management per PCP    Shared Decision Making/Informed Consent    The risks [stroke (1 in 1000), death (1 in 1000), kidney failure [usually temporary] (1 in 500), bleeding (1 in 200), allergic reaction [possibly serious] (1 in 200)], benefits (diagnostic support and management of coronary artery disease) and alternatives of a cardiac catheterization were discussed in detail with Mr. CockmanJaroszewski is willing to proceed.  Medication Adjustments/Labs and Tests Ordered: Current medicines are reviewed at length with the patient today.  Concerns regarding medicines are outlined above.  Orders Placed This Encounter    Procedures  . Basic Metabolic Panel (BMET)  . CBC  . EKG 12-Lead  . ECHOCARDIOGRAM COMPLETE   Meds ordered this encounter  Medications  . aspirin EC 81 MG tablet    Sig: Take 1 tablet (81 mg total) by mouth daily. Swallow whole.    Dispense:  90 tablet    Refill:  3  . lisinopril (ZESTRIL) 40 MG tablet    Sig: Take 1 tablet (40 mg total) by mouth daily.    Dispense:  90 tablet    Refill:  2    Dose increase  . carvedilol (COREG) 3.125 MG tablet    Sig: Take 1 tablet (3.125 mg total) by mouth 2 (two) times daily with a meal.    Dispense:  180 tablet    Refill:  2  . atorvastatin (LIPITOR) 80 MG tablet    Sig: Take 1 tablet (80 mg total) by mouth daily.    Dispense:  90 tablet    Refill:  2    Dose increase    Patient Instructions  Medication Instructions:  Your physician has recommended you make the following change in your medication: Start Aspirin 81 mg by mouth daily. Start Carvedilol 3.125 mg by mouth twice daily. Increase Atorvastatin to 80 mg by mouth daily.  Increase Lisinopril to 40 mg by mouth daily.   *If you need a refill on your cardiac medications before your next appointment, please call your pharmacy*   Lab Work: Lab work to be done today --BMP, CBC If you have labs (blood work) drawn today and your tests are completely normal, you will receive your results only by: . MyChaMarland Kitchent Message (if you have MyChart) OR . A paper copy in the mail If you have any lab test that is abnormal or we need to change your treatment, we will call you to review the results.   Testing/Procedures: Your physician has requested that you have a cardiac catheterization. Cardiac catheterization is used to diagnose and/or treat various heart conditions. Doctors may recommend this procedure for a number of different reasons. The most common reason is to evaluate chest pain. Chest pain can be a symptom of coronary artery disease (CAD), and cardiac catheterization can show whether plaque  is narrowing or blocking your heart's arteries. This procedure is also used to evaluate the valves, as well as measure the blood flow and oxygen levels in different parts of your heart. For further information please visit [email or web address] follow instruction sheet, as given. Scheduled for  December 29,2021  Your physician has requested that you have an echocardiogram. Echocardiography is a painless test that uses sound waves to create images of your heart. It provides your doctor with information about the size and shape of your heart and how well your heart's chambers and valves are working. This procedure takes approximately one hour. There are no restrictions for this procedure.    Follow-Up: At [facility], you and your health needs are our priority.  As part of our continuing mission to provide you with exceptional heart care, we have created designated Provider Care Teams.  These Care Teams include your primary Cardiologist (physician) and Advanced Practice Providers (APPs -  Physician Assistants and Nurse Practitioners) who all work together to provide you with the care you need, when you need it.  We recommend signing up for the patient portal called "MyChart".  Sign up information is provided on this After Visit Summary.  MyChart is used to connect with patients for Virtual Visits (Telemedicine).  Patients are able to view lab/test results, encounter notes, upcoming appointments, etc.  Non-urgent messages can be sent to your provider as well.   To learn more about what you can do with MyChart, go to [email or web address].    Your next appointment:   One month  The format for your next appointment:   In Person  Provider:   You may see Freada Bergeron, MD or one of the following Advanced Practice Providers on your designated Care Team:    Richardson Dopp, PA-C  Robbie Lis, [place]    Other Instructions     [place] OFFICE [place], [place] [address] Dept: [phone] Loc: [place]  09/20/2020  You are scheduled for a Cardiac Catheterization on Wednesday, December 29 with Dr. Lauree Chandler.  1. Please arrive at the [facility] (Main Entrance A) at [facility]: [address] [place], [place] [address] at 6:30 AM(This time is two hours before your procedure to ensure your preparation). Free valet parking service is available.   Special note: Every effort is made to have your procedure done on time. Please understand that emergencies sometimes delay scheduled procedures.  2. Diet: Do not eat solid foods after midnight.  The patient may have clear liquids until 5am upon the day of the procedure.  3. Labs: Done in office on 09/20/20  4. Medication instructions in preparation for your procedure:   Contrast Allergy: No   Do not take glipizide the morning of the procedure  On the morning of your procedure, take your Aspirin and any morning medicines NOT listed above.  You may use sips of water.  5. Plan for one night stay--bring personal belongings. 6. Bring a current list of your medications and current insurance cards. 7. You MUST have a responsible person to drive you home. 8. Someone MUST be with you the first 24 hours after you arrive home or your discharge will be delayed. 9. Please wear clothes that are easy to get on and off and wear slip-on shoes.  Thank you for allowing [name] to care for you!   -- [place] [facility]       Due to recent COVID-19 restrictions implemented by our local and state authorities and in an effort to keep both patients and staff as safe as possible, our hospital system requires COVID-19 testing prior to certain scheduled hospital procedures. Please  go to [address] [place], [place] [address] on 10/11/20 at 2:05 PM This is a drive up  testing site. You will not need to exit your vehicle. You will not be billed at the time of testing but may receive a bill later depending on your insurance. The approximate cost of the test is $100. You must agree to self-quarantine from the time of your testing until the procedure date on 10/13/20 This should included staying home with ONLY the people you live with. Avoid take-out, grocery store shopping or leaving the house for any non-emergent reason. Failure to have your COVID-19 test done on the date and time you have been scheduled will result in cancellation of your procedure. Please call our office at [phone] if you have any questions     Signed, Freada Bergeron, MD  09/20/2020 4:52 PM    [place]

## 2020-09-20 ENCOUNTER — Other Ambulatory Visit: Payer: Self-pay

## 2020-09-20 ENCOUNTER — Ambulatory Visit (INDEPENDENT_AMBULATORY_CARE_PROVIDER_SITE_OTHER): Payer: Self-pay | Admitting: Cardiology

## 2020-09-20 ENCOUNTER — Other Ambulatory Visit: Payer: Self-pay | Admitting: Cardiology

## 2020-09-20 ENCOUNTER — Encounter: Payer: Self-pay | Admitting: Cardiology

## 2020-09-20 VITALS — BP 140/70 | HR 64 | Ht 70.0 in | Wt 232.6 lb

## 2020-09-20 DIAGNOSIS — I10 Essential (primary) hypertension: Secondary | ICD-10-CM

## 2020-09-20 DIAGNOSIS — E7800 Pure hypercholesterolemia, unspecified: Secondary | ICD-10-CM

## 2020-09-20 DIAGNOSIS — I25118 Atherosclerotic heart disease of native coronary artery with other forms of angina pectoris: Secondary | ICD-10-CM

## 2020-09-20 DIAGNOSIS — E119 Type 2 diabetes mellitus without complications: Secondary | ICD-10-CM

## 2020-09-20 MED ORDER — ASPIRIN EC 81 MG PO TBEC: 81.0000 mg | DELAYED_RELEASE_TABLET | Freq: Every day | ORAL | 3 refills | Status: DC

## 2020-09-20 MED ORDER — ATORVASTATIN CALCIUM 80 MG PO TABS: 80.0000 mg | ORAL_TABLET | Freq: Every day | ORAL | 2 refills | Status: DC

## 2020-09-20 MED ORDER — CARVEDILOL 3.125 MG PO TABS: 3.1250 mg | ORAL_TABLET | Freq: Two times a day (BID) | ORAL | 2 refills | Status: DC

## 2020-09-20 MED ORDER — LISINOPRIL 40 MG PO TABS: 40.0000 mg | ORAL_TABLET | Freq: Every day | ORAL | 2 refills | Status: DC

## 2020-09-20 NOTE — Patient Instructions (Addendum)
Medication Instructions:  Your physician has recommended you make the following change in your medication: Start Aspirin 81 mg by mouth daily. Start Carvedilol 3.125 mg by mouth twice daily. Increase Atorvastatin to 80 mg by mouth daily.  Increase Lisinopril to 40 mg by mouth daily.   *If you need a refill on your cardiac medications before your next appointment, please call your pharmacy*   Lab Work: Lab work to be done today --BMP, CBC If you have labs (blood work) drawn today and your tests are completely normal, you will receive your results only by: Marland Kitchen MyChart Message (if you have MyChart) OR . A paper copy in the mail If you have any lab test that is abnormal or we need to change your treatment, we will call you to review the results.   Testing/Procedures: Your physician has requested that you have a cardiac catheterization. Cardiac catheterization is used to diagnose and/or treat various heart conditions. Doctors may recommend this procedure for a number of different reasons. The most common reason is to evaluate chest pain. Chest pain can be a symptom of coronary artery disease (CAD), and cardiac catheterization can show whether plaque is narrowing or blocking your heart's arteries. This procedure is also used to evaluate the valves, as well as measure the blood flow and oxygen levels in different parts of your heart. For further information please visit [email or web address] Please follow instruction sheet, as given. Scheduled for December 29,2021  Your physician has requested that you have an echocardiogram. Echocardiography is a painless test that uses sound waves to create images of your heart. It provides your doctor with information about the size and shape of your heart and how well your heart's chambers and valves are working. This procedure takes approximately one hour. There are no restrictions for this procedure.    Follow-Up: At [facility], you and your health needs  are our priority.  As part of our continuing mission to provide you with exceptional heart care, we have created designated Provider Care Teams.  These Care Teams include your primary Cardiologist (physician) and Advanced Practice Providers (APPs -  Physician Assistants and Nurse Practitioners) who all work together to provide you with the care you need, when you need it.  We recommend signing up for the patient portal called "MyChart".  Sign up information is provided on this After Visit Summary.  MyChart is used to connect with patients for Virtual Visits (Telemedicine).  Patients are able to view lab/test results, encounter notes, upcoming appointments, etc.  Non-urgent messages can be sent to your provider as well.   To learn more about what you can do with MyChart, go to [email or web address].    Your next appointment:   One month  The format for your next appointment:   In Person  Provider:   You may see Meriam Sprague, MD or one of the following Advanced Practice Providers on your designated Care Team:    Tereso Newcomer, PA-C  Chelsea Aus, [place]    Other Instructions     [facility] CARDIOVASCULAR DIVISION [facility] OFFICE [address] [place] [address] Dept: [phone] Loc: [phone]  Jeremy [name]  09/20/2020  You are scheduled for a Cardiac Catheterization on Wednesday, December 29 with Dr. Verne Carrow.  1. Please arrive at the [facility] (Main Entrance A) at [facility]: [address] [place], [place] [address] at 6:30 AM(This time is two hours before your procedure  to ensure your preparation). Free valet parking service is available.   Special note: Every effort is made to have your procedure done on time. Please understand that emergencies sometimes delay scheduled procedures.  2. Diet: Do not eat solid foods after midnight.  The patient may have clear liquids until 5am upon the  day of the procedure.  3. Labs: Done in office on 09/20/20  4. Medication instructions in preparation for your procedure:   Contrast Allergy: No   Do not take glipizide the morning of the procedure  On the morning of your procedure, take your Aspirin and any morning medicines NOT listed above.  You may use sips of water.  5. Plan for one night stay--bring personal belongings. 6. Bring a current list of your medications and current insurance cards. 7. You MUST have a responsible person to drive you home. 8. Someone MUST be with you the first 24 hours after you arrive home or your discharge will be delayed. 9. Please wear clothes that are easy to get on and off and wear slip-on shoes.  Thank you for allowing [name] to care for you!   -- [place] [facility]       Due to recent COVID-19 restrictions implemented by our local and state authorities and in an effort to keep both patients and staff as safe as possible, our hospital system requires COVID-19 testing prior to certain scheduled hospital procedures. Please go to [address] [facility] [place], [place] [address] on 10/11/20 at 2:05 PM This is a drive up testing site. You will not need to exit your vehicle. You will not be billed at the time of testing but may receive a bill later depending on your insurance. The approximate cost of the test is $100. You must agree to self-quarantine from the time of your testing until the procedure date on 10/13/20 This should included staying home with ONLY the people you live with. Avoid take-out, grocery store shopping or leaving the house for any non-emergent reason. Failure to have your COVID-19 test done on the date and time you have been scheduled will result in cancellation of your procedure. Please call our office at [phone] if you have any questions

## 2020-09-21 LAB — CBC
Hematocrit: 39.2 % (ref 37.5–51.0)
Hemoglobin: 13.4 g/dL (ref 13.0–17.7)
MCH: 29.3 pg (ref 26.6–33.0)
MCHC: 34.2 g/dL (ref 31.5–35.7)
MCV: 86 fL (ref 79–97)
Platelets: 198 10*3/uL (ref 150–450)
RBC: 4.57 x10E6/uL (ref 4.14–5.80)
RDW: 12.5 % (ref 11.6–15.4)
WBC: 5.5 10*3/uL (ref 3.4–10.8)

## 2020-09-21 LAB — BASIC METABOLIC PANEL
BUN/Creatinine Ratio: 16 (ref 9–20)
BUN: 14 mg/dL (ref 6–24)
CO2: 25 mmol/L (ref 20–29)
Calcium: 9.2 mg/dL (ref 8.7–10.2)
Chloride: 106 mmol/L (ref 96–106)
Creatinine, Ser: 0.86 mg/dL (ref 0.76–1.27)
GFR calc Af Amer: 111 mL/min/{1.73_m2} (ref >59)
GFR calc non Af Amer: 96 mL/min/{1.73_m2} (ref >59)
Glucose: 128 mg/dL — ABNORMAL HIGH (ref 65–99)
Potassium: 4 mmol/L (ref 3.5–5.2)
Sodium: 141 mmol/L (ref 134–144)

## 2020-09-21 MED FILL — ?CARVEDILOL 3.125 MG TABLET: 3.125 | 30 days supply | Qty: 60 | Fill #0

## 2020-09-21 MED FILL — ATORVASTATIN CALCIUM 80 MG: 80 | 30 days supply | Qty: 30 | Fill #0

## 2020-09-21 MED FILL — LISINOPRIL 40 MG TABLET: 40 | 30 days supply | Qty: 30 | Fill #0

## 2020-10-04 ENCOUNTER — Encounter: Payer: Self-pay | Admitting: Internal Medicine

## 2020-10-11 ENCOUNTER — Other Ambulatory Visit (HOSPITAL_COMMUNITY): Payer: Self-pay

## 2020-10-11 NOTE — Progress Notes (Signed)
Left voice mail with the following instructions.  Arrival time 6:30, nothing to eat or drink after midnight, will need responsible person to drive you home as well as stay with you for 1st 24 hours.   Take all of you morning medication the morning of procedure.

## 2020-10-12 ENCOUNTER — Telehealth: Payer: Self-pay | Admitting: Internal Medicine

## 2020-10-12 MED FILL — ?ROPINIROLE HCL 0.5MG TAB: 0.5 | 30 days supply | Qty: 30 | Fill #1

## 2020-10-12 NOTE — Telephone Encounter (Signed)
Copied from CRM [id number]. Topic: Appointment Scheduling - Scheduling Inquiry for Clinic >> Oct 11, 2020  4:21 PM Elliot Gault wrote: Lorain Childes for the practice - patient states he missed his COVID test today and would like to [facility]. Patient confirmed he has a upcoming surgery appt within [facility] therefore transferred him to [facility] [phone] - pre procedure testing  Just an FYI.

## 2020-10-12 NOTE — Progress Notes (Signed)
Called patient to go over instructions for procedure for tomorrow and to see if he could go get covid test today.  He states he hasn't been able to get covid test done, he wants to cancel procedure for tomorrow and reschedule for a later date.  He will call the office when he is ready to get procedure done.

## 2020-10-13 ENCOUNTER — Encounter (HOSPITAL_COMMUNITY): Admission: RE | Payer: Self-pay | Source: Home / Self Care

## 2020-10-13 ENCOUNTER — Ambulatory Visit (HOSPITAL_COMMUNITY): Admission: RE | Admit: 2020-10-13 | Payer: Self-pay | Source: Home / Self Care | Admitting: Cardiovascular Disease

## 2020-10-13 SURGERY — LEFT HEART CATH AND CORONARY ANGIOGRAPHY
Anesthesia: LOCAL

## 2020-10-18 ENCOUNTER — Other Ambulatory Visit: Payer: Self-pay | Admitting: Internal Medicine

## 2020-10-18 ENCOUNTER — Telehealth: Payer: Self-pay | Admitting: Cardiology

## 2020-10-18 DIAGNOSIS — E119 Type 2 diabetes mellitus without complications: Secondary | ICD-10-CM

## 2020-10-18 MED FILL — ?GLIPIZIDE 5MG TABLET: 5 | 30 days supply | Qty: 60 | Fill #0

## 2020-10-18 MED FILL — ATORVASTATIN CALCIUM 80 MG: 80 | 30 days supply | Qty: 30 | Fill #1

## 2020-10-18 NOTE — Telephone Encounter (Signed)
Patient calling to reschedule his catheretization

## 2020-10-18 NOTE — Progress Notes (Deleted)
Cardiology Office Note:    Date:  10/18/2020   ID:  Jeremy [name], DOB 1961-09-13, MRN [id number]  PCP:  Marcine Matar, MD  [facility] Cardiologist:  Meriam Sprague, MD  [facility] Electrophysiologist:  None   Referring MD: Marcine Matar, MD    History of Present Illness:    Jeremy [name] is a 60 y.o. male with a hx of HTN, HLD, DMII, OSA, aortic atherosclerosis who presents to clinic for follow-up.  During our last visit, the patient was seen for multivessel, extensive coronary calcification noted on CT abdomen/pelvis. He was also complaining of exertional chest discomfort and SOB at that time that was relieved with rest. Symptoms were particularly worse when walking an incline. During that visit, we recommended coronary angiography but he had to re-schedule.  Family history: Father with CAD with MI and passed away in 48s. Mother deceased from pancreatic cancer. Brother passed away with COPD. Brother passed away from COVID-19. Brother with testicular cancer (still alive)  Past Medical History:  Diagnosis Date  . Diabetes mellitus without complication (HCC)   . GERD (gastroesophageal reflux disease)   . Hyperlipidemia   . Hypertension   . IDA (iron deficiency anemia)   . Rash of entire body 03/2016    Past Surgical History:  Procedure Laterality Date  . BACK SURGERY  1993   lumbar  . BIOPSY  02/07/2019   Procedure: BIOPSY;  Surgeon: West Bali, MD;  [facility]: [facility];  Service: Endoscopy;;  . CERVICAL SPINE SURGERY  2000  . COLONOSCOPY N/A 02/07/2019   Dr. Darrick Penna: External hemorrhoids next colonoscopy in 10 years  . ESOPHAGOGASTRODUODENOSCOPY N/A 02/07/2019   Dr. Darrick Penna: Barrett's esophagus without dysplasia chronic inactive gastritis but no H. 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 ESOPHAGOGASTRODUODENOSCOPY N/A 01/05/2020   Procedure:  ESOPHAGOGASTRODUODENOSCOPY (EGD);  Surgeon: West Bali, MD;  Location: [facility];  Service: Endoscopy;  Laterality: N/A;  10:30am  . HAND SURGERY    . SAVORY DILATION N/A 01/05/2020   Procedure: SAVORY DILATION;  Surgeon: West Bali, MD;  Location: [facility];  Service: Endoscopy;  Laterality: N/A;    Current Medications: No outpatient medications have been marked as taking for the 10/28/20 encounter (Appointment) with Meriam Sprague, MD.     Allergies:   Cymbalta [duloxetine hcl], Flomax [tamsulosin], Other, and Robaxin [methocarbamol]   Social History   Socioeconomic History  . Marital status: Significant Other    Spouse name: Not on file  . Number of children: Not on file  . Years of education: Not on file  . Highest education level: Not on file  Occupational History  . Occupation: Product/process development scientist  Tobacco Use  . Smoking status: Former Smoker    Quit date: 10/16/1988    Years since quitting: 32.0  . Smokeless tobacco: Never Used  Vaping Use  . Vaping Use: Never used  Substance and Sexual Activity  . Alcohol use: No  . Drug use: No  . Sexual activity: Not on file  Other Topics Concern  . Not on file  Social History Narrative   Volunteers as IT sales professional   Social Determinants of Corporate investment banker Strain: Not on file  Food Insecurity: Not on file  Transportation Needs: Not on file  Physical Activity: Not on file  Stress: Not on file  Social Connections: Not on file  Family History: The patient's ***family history includes CAD in his father; Colon cancer in his paternal grandfather; Pancreatic cancer in his mother.  ROS:   Please see the history of present illness.    *** All other systems reviewed and are negative.  EKGs/Labs/Other Studies Reviewed:    The following studies were reviewed today: CT abdomen/pelvis: FINDINGS: Lower chest: The visualized lung bases are clear bilaterally. Extensive multi-vessel coronary artery  calcification. Global cardiac size is within normal limits.  Hepatobiliary: No focal liver abnormality is seen. No gallstones, gallbladder wall thickening, or biliary dilatation.  Pancreas: Unremarkable  Spleen: Unremarkable  Adrenals/Urinary Tract: The adrenal glands are unremarkable. Punctate 1-2 mm nonobstructing calculi are seen within the lower pole of the kidneys bilaterally. Simple cortical cyst noted within the lower pole of the right kidney. The kidneys are otherwise unremarkable. The bladder is unremarkable.  Stomach/Bowel: There is periluminal inflammatory stranding and hyperemia involving the terminal gastric antrum, pylorus, and duodenal bulb in keeping with an infectious or inflammatory antritis/duodenitis, as can be seen with caustic ingestion, drug toxicity, Helicobacter pylori infection, and hyper secretory conditions. No evidence of perforation. No obstruction. The small and large bowel are unremarkable. Appendix normal. No free intraperitoneal gas or fluid.  Vascular/Lymphatic: Mild aortoiliac atherosclerotic calcification without evidence of aneurysm. No pathologic adenopathy within the abdomen and pelvis.  Reproductive: Prostate is unremarkable.  Other: Rectum unremarkable  Musculoskeletal: Degenerative changes seen within the lumbar spine with calcified broad-based posterior disc bulges throughout the lumbar spine resulting in multilevel central canal stenosis. No acute bone abnormality.  IMPRESSION: Inflammatory changes in keeping with infectious or inflammatory gastritis/duodenitis. No evidence of obstruction or perforation. Correlation with endoscopy would be helpful for further evaluation.  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 This was better assessed on prior MRI examination of 01/13/2017.  EKG:  EKG is *** ordered today.  The ekg ordered today demonstrates  ***  Recent Labs: 07/22/2020: B Natriuretic Peptide 73.5 07/23/2020: ALT 26 09/20/2020: BUN 14; Creatinine, Ser 0.86; Hemoglobin 13.4; Platelets 198; Potassium 4.0; Sodium 141  Recent Lipid Panel    Component Value Date/Time   CHOL 150 02/05/2020 1442   TRIG 168 (H) 02/05/2020 1442   HDL 40 02/05/2020 1442   CHOLHDL 3.8 02/05/2020 1442   CHOLHDL 4.8 10/17/2016 1012   VLDL NOT CALC 10/17/2016 1012   LDLCALC 81 02/05/2020 1442     Risk Assessment/Calculations:   {Does this patient have ATRIAL FIBRILLATION?[phone]}   Physical Exam:    VS:  There were no vitals taken for this visit.    Wt Readings from Last 3 Encounters:  09/20/20 232 lb 9.6 oz (105.5 kg)  07/22/20 234 lb 6.4 oz (106.3 kg)  07/01/20 229 lb (103.9 kg)     GEN: *** Well nourished, well developed in no acute distress HEENT: Normal NECK: No JVD; No carotid bruits LYMPHATICS: No lymphadenopathy CARDIAC: ***RRR, no murmurs, rubs, gallops RESPIRATORY:  Clear to auscultation without rales, wheezing or rhonchi  ABDOMEN: Soft, non-tender, non-distended MUSCULOSKELETAL:  No edema; No deformity  SKIN: Warm and dry NEUROLOGIC:  Alert and oriented x 3 PSYCHIATRIC:  Normal affect   ASSESSMENT:    No diagnosis found. 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 Symptoms occur with slight incline or walking stairs. Have been getting worse since his daughter passed away in 17-Apr-2023.  CT abdomen/pelvis with extensive multivessel coronary calcification. Given symptoms and CT findings, will proceed with coronary angiography -Plan for coronary angiography  -Check TTE -Start ASA 81mg  daily -Increase lipitor 80mg  daily -Start coreg 3.125mg  BID -Increase lisinopril to 40mg  daily  #HTN Elevated at 140s today. Goal <120s/80s -Increase lisinopril to 40mg  daily -Start coreg 3.125mg  BID  #HLD -Increase  atorvastatin to 80mg  daily -Repeat cholesterol in 6 weeks  #DMII Well controlled. HgA1C 6.5 -Continue management per PCP   {Are you ordering a CV Procedure (e.g. stress test, cath, DCCV, TEE, etc)?   Press F2        :    Medication Adjustments/Labs and Tests Ordered: Current medicines are reviewed at length with the patient today.  Concerns regarding medicines are outlined above.  No orders of the defined types were placed in this encounter.  No orders of the defined types were placed in this encounter.   There are no Patient Instructions on file for this visit.   Signed, , MD  10/18/2020 2:42 PM    [facility]

## 2020-10-18 NOTE — Telephone Encounter (Signed)
Pt missed COVID appt 10/11/20 prior to [facility] 10/13/20, [facility] was cancelled. Since last office visit/BMP/CBC were 09/20/20, pt is close to being within 30 day timeframe for office visit and BMP/CBC required prior to [facility]. Discussed with patient need for office visit/BMP/CBC within 30 days of [facility] and offered to try to schedule [facility] 10/20/20. Pt decided that he would like to get echo 10/21/20 and see Dr Shari Prows 10/28/20 to discuss results before rescheduling [facility]. Pt reports cardiac symptoms are about the same as at time of office visit with Dr Shari Prows 09/20/20.   At end of conversation, pt did mention that he currently has cold symptoms that are improving, associated with fever that has resolved, and to his knowledge, has not been around anyone with known COVID-19. Pt reports he has not had COVID-19 test since fever/cold symptoms started, I discussed getting COVID-19 test now to determine if current symptoms COVID related, not sure if patient will plan to do that.

## 2020-10-19 MED FILL — ?ROPINIROLE HCL 0.5MG TAB: 0.5 | 30 days supply | Qty: 30 | Fill #1

## 2020-10-21 ENCOUNTER — Encounter (HOSPITAL_COMMUNITY): Payer: Self-pay | Admitting: Cardiology

## 2020-10-21 ENCOUNTER — Other Ambulatory Visit (HOSPITAL_COMMUNITY): Payer: Self-pay

## 2020-10-25 MED FILL — ?CARVEDILOL 3.125 MG TABLET: 3.125 | 30 days supply | Qty: 60 | Fill #1

## 2020-10-25 MED FILL — LISINOPRIL 40 MG TABLET: 40 | 30 days supply | Qty: 30 | Fill #1

## 2020-10-28 ENCOUNTER — Ambulatory Visit: Payer: Self-pay | Admitting: Cardiology

## 2020-11-02 NOTE — Progress Notes (Signed)
Cardiology Office Note:    Date:  11/12/2020   ID:  Jeremy [name], DOB 12-Jul-1961, MRN [id number]  PCP:  [name], MD  [facility] Cardiologist:  Freada Bergeron, MD  [facility] Electrophysiologist:  None   Referring MD: [name], MD   No chief complaint on file.   History of Present Illness:    Jeremy [name] is a 60 y.o. male with a hx of HTN, HLD, DMII, OSA, aortic atherosclerosis who presents to clinic for follow-up.  During our last visit, the patient was seen for multivessel, extensive coronary calcification noted on CT abdomen/pelvis. He was also complaining of exertional chest discomfort and SOB at that time that was relieved with rest. Symptoms were particularly worse when walking an incline. During that visit, we recommended coronary angiography but he had to re-schedule due to feeling sick with congestion, fevers and cough.   Patient states that he feels better from his recent illness. He continues to have chest discomfort, but instead of occurring with exertion, it is occurring all the time. He states now that he has been paying attention to his pain, it does not seem to change with exertion. States it feels like reflux and improves somewhat with protonix, however, he can only take protonix 2x/week because it makes him feel bloated.. Notably he has been seen by GI in the past where he was diagnosed with "narrowings" in his duodenum one of which has been dilated but he has another one which was unable to be intervened up. He is wondering if this may be contributing to his symptoms. He has follow-up with GI later this month.  The patient also states that he has been having difficulty with intimacy since starting the coreg. His blood pressure is still high but he is hoping for an alternative medication to treat it.    TTE 11/11/20: LVEF 60-65%, moderate LVH, no significant valvular disease  Family history: Father with CAD with MI and passed away in  74s. Mother deceased from pancreatic cancer. Brother passed away with COPD. Brother passed away from COVID-19. Brother with testicular cancer (still alive)  Past Medical History:  Diagnosis Date  . Diabetes mellitus without complication ([place])   . GERD (gastroesophageal reflux disease)   . Hyperlipidemia   . Hypertension   . IDA (iron deficiency anemia)   . Rash of entire body 03/2016    Past Surgical History:  Procedure Laterality Date  . BACK SURGERY  1993   lumbar  . BIOPSY  02/07/2019   Procedure: BIOPSY;  Surgeon: Danie Binder, MD;  [facility]: [facility];  Service: Endoscopy;;  . CERVICAL SPINE SURGERY  2000  . COLONOSCOPY N/A 02/07/2019   Dr. Oneida Alar: External hemorrhoids next colonoscopy in 10 years  . ESOPHAGOGASTRODUODENOSCOPY N/A 02/07/2019   Dr. Oneida Alar: Barrett's esophagus without dysplasia chronic inactive gastritis but no H. 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 ESOPHAGOGASTRODUODENOSCOPY N/A 01/05/2020   Procedure: ESOPHAGOGASTRODUODENOSCOPY (EGD);  Surgeon: Danie Binder, MD;  Location: [facility];  Service: Endoscopy;  Laterality: N/A;  10:30am  . HAND SURGERY    . SAVORY DILATION N/A 01/05/2020   Procedure: SAVORY DILATION;  Surgeon: Danie Binder, MD;  Location: [facility];  Service: Endoscopy;  Laterality: N/A;    Current Medications: Current Meds  Medication Sig  . acetaminophen (TYLENOL) 500 MG tablet Take 1,000 mg by mouth in the morning  and at bedtime.   Marland Kitchen amLODipine (NORVASC) 10 MG tablet Take 1 tablet (10 mg total) by mouth daily.  Marland Kitchen aspirin 81 MG chewable tablet Chew 81 mg by mouth daily.  Marland Kitchen atorvastatin (LIPITOR) 80 MG tablet Take 1 tablet (80 mg total) by mouth daily.  . Blood Glucose Monitoring Suppl (TRUE METRIX METER) w/Device KIT 1 each by Does not apply route 4 (four) times daily -  before meals and at bedtime.  Marland Kitchen glipiZIDE (GLUCOTROL) 5 MG  tablet TAKE 1 TABLETS BY MOUTH IN THE MORNINGS AND 1 TABLET IN THE EVENINGS.  Marland Kitchen glucose blood test strip Use as instructed  . lisinopril (ZESTRIL) 40 MG tablet Take 1 tablet (40 mg total) by mouth daily.  Marland Kitchen loratadine (CLARITIN) 10 MG tablet Take 10 mg by mouth daily.  . naproxen sodium (ALEVE) 220 MG tablet Take 440 mg by mouth 2 (two) times daily with a meal.  . pantoprazole (PROTONIX) 40 MG tablet TAKE 1 TABLET (40 MG TOTAL) BY MOUTH 2 (TWO) TIMES DAILY BEFORE A MEAL.  Marland Kitchen Potassium 99 MG TABS Take 99 mg by mouth daily.   Marland Kitchen rOPINIRole (REQUIP) 0.5 MG tablet Take 1 tablet (0.5 mg total) by mouth at bedtime.  . TRUEplus Lancets 28G MISC CHECK BLOOD SUGAR FASTING AND BEFORE MEALS AND AGAIN IF PT FEELS BAD (SYMPTOMS OF HYPO).  . [DISCONTINUED] carvedilol (COREG) 3.125 MG tablet Take 1 tablet (3.125 mg total) by mouth 2 (two) times daily with a meal.     Allergies:   Cymbalta [duloxetine hcl], Flomax [tamsulosin], Other, and Robaxin [methocarbamol]   Social History   Socioeconomic History  . Marital status: Significant Other    Spouse name: Not on file  . Number of children: Not on file  . Years of education: Not on file  . Highest education level: Not on file  Occupational History  . Occupation: Clinical biochemist  Tobacco Use  . Smoking status: Former Smoker    Quit date: 10/16/1988    Years since quitting: 32.0  . Smokeless tobacco: Never Used  Vaping Use  . Vaping Use: Never used  Substance and Sexual Activity  . Alcohol use: No  . Drug use: No  . Sexual activity: Not on file  Other Topics Concern  . Not on file  Social History Narrative   Volunteers as Airline pilot   Social Determinants of Radio broadcast assistant Strain: Not on file  Food Insecurity: Not on file  Transportation Needs: Not on file  Physical Activity: Not on file  Stress: Not on file  Social Connections: Not on file     Family History: The patient's family history includes CAD in his father; Colon  cancer in his paternal grandfather; Pancreatic cancer in his mother.  ROS:   Please see the history of present illness.    Review of Systems  Constitutional: Negative for chills and fever.  HENT: Negative for congestion.   Eyes: Negative for blurred vision.  Respiratory: Positive for shortness of breath.   Cardiovascular: Positive for chest pain. Negative for palpitations, orthopnea, claudication, leg swelling and PND.  Gastrointestinal: Positive for heartburn. Negative for nausea and vomiting.  Genitourinary: Negative for hematuria.  Musculoskeletal: Negative for joint pain.  Neurological: Negative for dizziness and loss of consciousness.  Endo/Heme/Allergies: Negative for polydipsia.  Psychiatric/Behavioral: Negative for substance abuse.    EKGs/Labs/Other Studies Reviewed:    The following studies were reviewed today: CT abdomen/pelvis: FINDINGS: Lower chest: The visualized lung bases are clear bilaterally. Extensive  multi-vessel coronary artery calcification. Global cardiac size is within normal limits.  Hepatobiliary: No focal liver abnormality is seen. No gallstones, gallbladder wall thickening, or biliary dilatation.  Pancreas: Unremarkable  Spleen: Unremarkable  Adrenals/Urinary Tract: The adrenal glands are unremarkable. Punctate 1-2 mm nonobstructing calculi are seen within the lower pole of the kidneys bilaterally. Simple cortical cyst noted within the lower pole of the right kidney. The kidneys are otherwise unremarkable. The bladder is unremarkable.  Stomach/Bowel: There is periluminal inflammatory stranding and hyperemia involving the terminal gastric antrum, pylorus, and duodenal bulb in keeping with an infectious or inflammatory antritis/duodenitis, as can be seen with caustic ingestion, drug toxicity, Helicobacter pylori infection, and hyper secretory conditions. No evidence of perforation. No obstruction. The small and large bowel are unremarkable.  Appendix normal. No free intraperitoneal gas or fluid.  Vascular/Lymphatic: Mild aortoiliac atherosclerotic calcification without evidence of aneurysm. No pathologic adenopathy within the abdomen and pelvis.  Reproductive: Prostate is unremarkable.  Other: Rectum unremarkable  Musculoskeletal: Degenerative changes seen within the lumbar spine with calcified broad-based posterior disc bulges throughout the lumbar spine resulting in multilevel central canal stenosis. No acute bone abnormality.  IMPRESSION: Inflammatory changes in keeping with infectious or inflammatory gastritis/duodenitis. No evidence of obstruction or perforation. Correlation with endoscopy would be helpful for further evaluation.  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 This was better assessed on prior MRI examination of 01/13/2017.  TTE 11/11/20: IMPRESSIONS  1. Left ventricular ejection fraction, by estimation, is 60 to 65%. The  left ventricle has normal function. The left ventricle has no regional  wall motion abnormalities. There is moderate left ventricular hypertrophy.  Left ventricular diastolic  parameters were normal.  2. Right ventricular systolic function is normal. The right ventricular  size is normal. Tricuspid regurgitation signal is inadequate for assessing  PA pressure.  3. The mitral valve is normal in structure. Trivial mitral valve  regurgitation. No evidence of mitral stenosis.  4. The aortic valve is tricuspid. Aortic valve regurgitation is trivial.  No aortic stenosis is present.  5. The inferior vena cava is normal in size with greater than 50%  respiratory variability, suggesting right atrial pressure of 3 mmHg.   FINDINGS  Left Ventricle: Left ventricular ejection fraction, by estimation, is 60  to 65%. The left ventricle has normal function. The left ventricle has no  regional wall motion  abnormalities. The left ventricular internal cavity  size was normal in size. There is  moderate left ventricular hypertrophy. Left ventricular diastolic  parameters were normal.   Right Ventricle: The right ventricular size is normal. No increase in  right ventricular wall thickness. Right ventricular systolic function is  normal. Tricuspid regurgitation signal is inadequate for assessing PA  pressure.   Left Atrium: Left atrial size was normal in size.   Right Atrium: Right atrial size was normal in size.   Pericardium: There is no evidence of pericardial effusion.   Mitral Valve: The mitral valve is normal in structure. Trivial mitral  valve regurgitation. No evidence of mitral valve stenosis.   Tricuspid Valve: The tricuspid valve is normal in structure. Tricuspid  valve regurgitation is trivial. No evidence of tricuspid stenosis.   Aortic Valve: The aortic valve is tricuspid. Aortic valve regurgitation is  trivial. No aortic stenosis is present.   Pulmonic Valve: The pulmonic valve was normal in structure. Pulmonic valve  regurgitation is trivial. No evidence of pulmonic stenosis.   Aorta: The aortic root is  normal in size and structure.   Venous: The inferior vena cava is normal in size with greater than 50%  respiratory variability, suggesting right atrial pressure of 3 mmHg.   IAS/Shunts: No atrial level shunt detected by color flow Doppler.    Recent Labs: 07/22/2020: B Natriuretic Peptide 73.5 07/23/2020: ALT 26 09/20/2020: BUN 14; Creatinine, Ser 0.86; Hemoglobin 13.4; Platelets 198; Potassium 4.0; Sodium 141  Recent Lipid Panel    Component Value Date/Time   CHOL 150 02/05/2020 1442   TRIG 168 (H) 02/05/2020 1442   HDL 40 02/05/2020 1442   CHOLHDL 3.8 02/05/2020 1442   CHOLHDL 4.8 10/17/2016 1012   VLDL NOT CALC 10/17/2016 1012   LDLCALC 81 02/05/2020 1442      Physical Exam:    VS:  BP (!) 146/70   Pulse 65   Ht 5' 10"  (1.778 m)   Wt 235 lb (106.6  kg)   SpO2 95%   BMI 33.72 kg/m     Wt Readings from Last 3 Encounters:  11/12/20 235 lb (106.6 kg)  09/20/20 232 lb 9.6 oz (105.5 kg)  07/22/20 234 lb 6.4 oz (106.3 kg)     GEN:  Well nourished, well developed in no acute distress HEENT: Normal CARDIAC: RRR, no murmurs, rubs, gallops RESPIRATORY:  Clear to auscultation without rales, wheezing or rhonchi  ABDOMEN: Soft, non-tender, non-distended MUSCULOSKELETAL:  No edema; No deformity  SKIN: Warm and dry NEUROLOGIC:  Alert and oriented x 3 PSYCHIATRIC:  Normal affect   ASSESSMENT:    1. Coronary artery disease of native artery of native heart with stable angina pectoris ([place])   2. Other chest pain   3. Primary hypertension   4. Pure hypercholesterolemia   5. Type 2 diabetes mellitus without complication, without long-term current use of insulin (HCC)    PLAN:    In order of problems listed above:  #Chest Pain #Multivessel CAD on CT chest: Patient states that since paying more attention to his chest pain, it seems to be occurring all day with no worsening with exertion. It feels like a constant discomfort that somewhat improves with protonix. Has history of what sounds to be strictures (?duodenal) and bad reflux in the past which may be driving his symptoms. However, he does notably have multivessel calcification on CT chest. After long discussion with the patient, he would like to proceed with lexiscan rather than cath at this time. If negative, will pursue GI work-up. If positive, can proceed with cath. -Plan for myovioew -TTE with normal BiV function, no significant valvular abnormalities -Continue ASA 64m daily -Continue lipitor 864mdaily -Stop coreg as patient having issues with intimacy on the medication -Start amlodipine 1071maily -Continue lisinopril 63m55mly  #?Duodenal strictures #GERD: Patient with history of "narrowings" in his GI tract which have been dilated in the past. Can only tolerate protonix  2x/week due to significant bloating if he takes more frequently. Concerned that possible strictures and reflux are contributing to his chest discomfort as above. -Agree with GI work-up--plan to schedule this month  #HTN Elevated at 140stoday. Goal <120s/80s -Continue lisinopril 63mg55mly -Change coreg to amlodipine 10mg 75mbove -Patient will monitor blood pressure at home  #HLD -Increase atorvastatin to 80mg d35m -Repeat cholesterol at next visit  #DMII Well controlled. HgA1C 6.5 -Continue management per PCP   Shared Decision Making/Informed Consent The risks [chest pain, shortness of breath, cardiac arrhythmias, dizziness, blood pressure fluctuations, myocardial infarction, stroke/transient ischemic attack, nausea, vomiting, allergic reaction, radiation exposure, metallic  taste sensation and life-threatening complications (estimated to be 1 in 10,000)], benefits (risk stratification, diagnosing coronary artery disease, treatment guidance) and alternatives of a nuclear stress test were discussed in detail with Mr. Loken and he agrees to proceed.    Medication Adjustments/Labs and Tests Ordered: Current medicines are reviewed at length with the patient today.  Concerns regarding medicines are outlined above.  Orders Placed This Encounter  Procedures  . MYOCARDIAL PERFUSION IMAGING   Meds ordered this encounter  Medications  . amLODipine (NORVASC) 10 MG tablet    Sig: Take 1 tablet (10 mg total) by mouth daily.    Dispense:  90 tablet    Refill:  3    Patient Instructions  Medication Instructions:  1) Discontinue Carvedilol   2) Start Amlodipine 10 mg daily   *If you need a refill on your cardiac medications before your next appointment, please call your pharmacy*   Lab Work: None ordered   If you have labs (blood work) drawn today and your tests are completely normal, you will receive your results only by: Marland Kitchen MyChart Message (if you have MyChart) OR . A paper  copy in the mail If you have any lab test that is abnormal or we need to change your treatment, we will call you to review the results.   Testing/Procedures: Your physician has requested that you have a lexiscan myoview. For further information please visit [email or web address]. Please follow instruction sheet, as given.  Follow-Up: At [facility], you and your health needs are our priority.  As part of our continuing mission to provide you with exceptional heart care, we have created designated Provider Care Teams.  These Care Teams include your primary Cardiologist (physician) and Advanced Practice Providers (APPs -  Physician Assistants and Nurse Practitioners) who all work together to provide you with the care you need, when you need it.  We recommend signing up for the patient portal called "MyChart".  Sign up information is provided on this After Visit Summary.  MyChart is used to connect with patients for Virtual Visits (Telemedicine).  Patients are able to view lab/test results, encounter notes, upcoming appointments, etc.  Non-urgent messages can be sent to your provider as well.   To learn more about what you can do with MyChart, go to [email or web address].    Your next appointment:   2 month(s)  The format for your next appointment:   In Person  Provider:   You may see Freada Bergeron, MD or one of the following Advanced Practice Providers on your designated Care Team:    Richardson Dopp, PA-C  Robbie Lis, [place]    Other Instructions None      Signed, Freada Bergeron, MD  11/12/2020 9:07 AM    [place]

## 2020-11-04 ENCOUNTER — Other Ambulatory Visit (HOSPITAL_COMMUNITY): Payer: Self-pay

## 2020-11-11 ENCOUNTER — Other Ambulatory Visit: Payer: Self-pay

## 2020-11-11 ENCOUNTER — Ambulatory Visit (HOSPITAL_COMMUNITY): Payer: Self-pay | Attending: Internal Medicine

## 2020-11-11 DIAGNOSIS — I25118 Atherosclerotic heart disease of native coronary artery with other forms of angina pectoris: Secondary | ICD-10-CM | POA: Insufficient documentation

## 2020-11-11 LAB — ECHOCARDIOGRAM COMPLETE
Area-P 1/2: 5.17 cm2
S' Lateral: 2.8 cm

## 2020-11-11 MED ORDER — PERFLUTREN LIPID MICROSPHERE
1.0000 mL | INTRAVENOUS | Status: AC | PRN
  Administered 2020-11-11: 3 mL via INTRAVENOUS

## 2020-11-12 ENCOUNTER — Encounter: Payer: Self-pay | Admitting: Cardiology

## 2020-11-12 ENCOUNTER — Ambulatory Visit (INDEPENDENT_AMBULATORY_CARE_PROVIDER_SITE_OTHER): Payer: Self-pay | Admitting: Cardiology

## 2020-11-12 ENCOUNTER — Other Ambulatory Visit: Payer: Self-pay | Admitting: Cardiology

## 2020-11-12 VITALS — BP 146/70 | HR 65 | Ht 70.0 in | Wt 235.0 lb

## 2020-11-12 DIAGNOSIS — E119 Type 2 diabetes mellitus without complications: Secondary | ICD-10-CM

## 2020-11-12 DIAGNOSIS — I25118 Atherosclerotic heart disease of native coronary artery with other forms of angina pectoris: Secondary | ICD-10-CM

## 2020-11-12 DIAGNOSIS — R079 Chest pain, unspecified: Secondary | ICD-10-CM

## 2020-11-12 DIAGNOSIS — I10 Essential (primary) hypertension: Secondary | ICD-10-CM

## 2020-11-12 DIAGNOSIS — R0789 Other chest pain: Secondary | ICD-10-CM

## 2020-11-12 DIAGNOSIS — E7800 Pure hypercholesterolemia, unspecified: Secondary | ICD-10-CM

## 2020-11-12 MED ORDER — AMLODIPINE BESYLATE 10 MG PO TABS: 10.0000 mg | ORAL_TABLET | Freq: Every day | ORAL | 3 refills | Status: DC

## 2020-11-12 MED FILL — AMLODIPINE BESYLATE 10 MG T: 10 | 30 days supply | Qty: 30 | Fill #0

## 2020-11-12 NOTE — Patient Instructions (Signed)
Medication Instructions:  1) Discontinue Carvedilol   2) Start Amlodipine 10 mg daily   *If you need a refill on your cardiac medications before your next appointment, please call your pharmacy*   Lab Work: None ordered   If you have labs (blood work) drawn today and your tests are completely normal, you will receive your results only by: Marland Kitchen MyChart Message (if you have MyChart) OR . A paper copy in the mail If you have any lab test that is abnormal or we need to change your treatment, we will call you to review the results.   Testing/Procedures: Your physician has requested that you have a lexiscan myoview. For further information please visit [email or web address] Please follow instruction sheet, as given.  Follow-Up: At [facility], you and your health needs are our priority.  As part of our continuing mission to provide you with exceptional heart care, we have created designated Provider Care Teams.  These Care Teams include your primary Cardiologist (physician) and Advanced Practice Providers (APPs -  Physician Assistants and Nurse Practitioners) who all work together to provide you with the care you need, when you need it.  We recommend signing up for the patient portal called "MyChart".  Sign up information is provided on this After Visit Summary.  MyChart is used to connect with patients for Virtual Visits (Telemedicine).  Patients are able to view lab/test results, encounter notes, upcoming appointments, etc.  Non-urgent messages can be sent to your provider as well.   To learn more about what you can do with MyChart, go to [email or web address].    Your next appointment:   2 month(s)  The format for your next appointment:   In Person  Provider:   You may see Meriam Sprague, MD or one of the following Advanced Practice Providers on your designated Care Team:    Tereso Newcomer, PA-C  Chelsea Aus, [place]    Other Instructions None

## 2020-11-15 ENCOUNTER — Other Ambulatory Visit: Payer: Self-pay

## 2020-11-15 ENCOUNTER — Ambulatory Visit: Payer: Self-pay | Attending: Internal Medicine | Admitting: Internal Medicine

## 2020-11-15 ENCOUNTER — Other Ambulatory Visit: Payer: Self-pay | Admitting: Internal Medicine

## 2020-11-15 ENCOUNTER — Encounter: Payer: Self-pay | Admitting: Internal Medicine

## 2020-11-15 VITALS — BP 132/70 | HR 66 | Temp 98.3°F | Resp 16 | Wt 233.0 lb

## 2020-11-15 DIAGNOSIS — M25561 Pain in right knee: Secondary | ICD-10-CM

## 2020-11-15 DIAGNOSIS — I10 Essential (primary) hypertension: Secondary | ICD-10-CM

## 2020-11-15 DIAGNOSIS — M545 Low back pain, unspecified: Secondary | ICD-10-CM

## 2020-11-15 DIAGNOSIS — G8929 Other chronic pain: Secondary | ICD-10-CM

## 2020-11-15 DIAGNOSIS — M160 Bilateral primary osteoarthritis of hip: Secondary | ICD-10-CM

## 2020-11-15 MED ORDER — TRAMADOL HCL 50 MG PO TABS: 50.0000 mg | ORAL_TABLET | Freq: Two times a day (BID) | ORAL | 1 refills | Status: DC | PRN

## 2020-11-15 MED FILL — ?ROPINIROLE HCL 0.5MG TAB: 0.5 | 30 days supply | Qty: 30 | Fill #2

## 2020-11-15 MED FILL — traMADol HCL 50 MG TABS: 50 | 30 days supply | Qty: 60 | Fill #0

## 2020-11-15 NOTE — Patient Instructions (Signed)
I do not think you fractured the knee.  I think you bruised the soft tissue.  Use heat as needed and anti-inflammatory rub.  Follow up if no improvement.
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
## 2020-11-18 ENCOUNTER — Telehealth (HOSPITAL_COMMUNITY): Payer: Self-pay

## 2020-11-18 NOTE — Telephone Encounter (Signed)
Spoke with the patient, detailed instructions given. He stated that he will be here for his test, Asked to call back with any questions. S.[name] EMTP

## 2020-11-22 MED FILL — ATORVASTATIN CALCIUM 80 MG: 80 | 30 days supply | Qty: 30 | Fill #2

## 2020-11-22 MED FILL — LISINOPRIL 40 MG TABLET: 40 | 30 days supply | Qty: 30 | Fill #2

## 2020-11-22 MED FILL — ?GLIPIZIDE 5MG TABLET: 5 | 30 days supply | Qty: 60 | Fill #1

## 2020-11-23 ENCOUNTER — Ambulatory Visit (HOSPITAL_COMMUNITY): Payer: Self-pay | Attending: Cardiovascular Disease

## 2020-11-23 ENCOUNTER — Other Ambulatory Visit: Payer: Self-pay

## 2020-11-23 DIAGNOSIS — R0789 Other chest pain: Secondary | ICD-10-CM | POA: Insufficient documentation

## 2020-11-23 LAB — MYOCARDIAL PERFUSION IMAGING
LV dias vol: 123 mL (ref 62–150)
LV sys vol: 48 mL
Peak HR: 84 {beats}/min
Rest HR: 67 {beats}/min
SDS: 0
SRS: 0
SSS: 0
TID: 1.09

## 2020-11-23 MED ORDER — TECHNETIUM TC 99M TETROFOSMIN IV KIT
10.9000 | PACK | Freq: Once | INTRAVENOUS | Status: AC | PRN
  Administered 2020-11-23: 10.9 via INTRAVENOUS
  Filled 2020-11-23: qty 11

## 2020-11-23 MED ORDER — REGADENOSON 0.4 MG/5ML IV SOLN
0.4000 mg | Freq: Once | INTRAVENOUS | Status: AC
  Administered 2020-11-23: 0.4 mg via INTRAVENOUS

## 2020-11-23 MED ORDER — TECHNETIUM TC 99M TETROFOSMIN IV KIT
30.9000 | PACK | Freq: Once | INTRAVENOUS | Status: AC | PRN
  Administered 2020-11-23: 30.9 via INTRAVENOUS
  Filled 2020-11-23: qty 31

## 2020-11-24 ENCOUNTER — Ambulatory Visit (INDEPENDENT_AMBULATORY_CARE_PROVIDER_SITE_OTHER): Payer: Self-pay | Admitting: Internal Medicine

## 2020-11-24 ENCOUNTER — Encounter: Payer: Self-pay | Admitting: Internal Medicine

## 2020-11-24 ENCOUNTER — Telehealth: Payer: Self-pay | Admitting: *Deleted

## 2020-11-24 VITALS — BP 157/68 | HR 79 | Temp 98.2°F | Ht 70.0 in | Wt 232.4 lb

## 2020-11-24 DIAGNOSIS — Q438 Other specified congenital malformations of intestine: Secondary | ICD-10-CM

## 2020-11-24 DIAGNOSIS — K311 Adult hypertrophic pyloric stenosis: Secondary | ICD-10-CM

## 2020-11-24 DIAGNOSIS — K2100 Gastro-esophageal reflux disease with esophagitis, without bleeding: Secondary | ICD-10-CM

## 2020-11-24 NOTE — Telephone Encounter (Signed)
LMOVM for pt to schedule EGD/DIL with Dr. Marletta Lor, ASA 2

## 2020-11-24 NOTE — Patient Instructions (Signed)
We will schedule you for EGD with dilation to further evaluate your worsening GI symptoms.  Continue on pantoprazole for now.  We may change this up depending on EGD findings.  Avoid NSAIDs especially.  At [facility] we value your feedback. You may receive a survey about your visit today. Please share your experience as we strive to create trusting relationships with our patients to provide genuine, compassionate, quality care.  We appreciate your understanding and patience as we review any laboratory studies, imaging, and other diagnostic tests that are ordered as we care for you. Our office policy is 5 business days for review of these results, and any emergent or urgent results are addressed in a timely manner for your best interest. If you do not hear from our office in 1 week, please contact us.   We also encourage the use of MyChart, which contains your medical information for your review as well. If you are not enrolled in this feature, an access code is on this after visit summary for your convenience. Thank you for allowing [name] to be involved in your care.  It was great to see you today!  I hope you have a great rest of your winter!!    Hennie Duos. Marletta Lor, D.O. Gastroenterology and Hepatology [facility]

## 2020-11-25 NOTE — Telephone Encounter (Signed)
Called pt. He has been scheduled for 2/21 at 11:45am. He is aware instructions with covid test appt will be mailed and sent to  [facility]. He asked if he could have covid test done in [place]. I spoke with endo and they were not aware of having our patients go to [facility] for covid testing for procedure.   I called patient and made aware. He voiced understanding
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
## 2020-11-25 NOTE — Progress Notes (Signed)
Referring Provider: [name], MD Primary Care Physician:  [name], MD Primary GI:  Dr. Abbey Chatters  Chief Complaint  Patient presents with  . Chest Pain    Chest pain 15 to 30 after eating, negative stress test yesterday, rumbling and moving sensation in stomach area, burping x 1 wk    HPI:   Jeremy [name] is a 60 y.o. male who presents to the clinic today for follow up visit.  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   We attempted to place a capsule endoscopy past the stricture which was unsuccessful.  He underwent dilation of both his pylorus and duodenal stricture with minimal improvement in his symptoms.  He has seen surgery who recommends conservative management at this stage as patient is hesitant to undergo any surgical fixation at this time.   Also has a history of Barrett's esophagus without dysplasia.  Today, patient states he is starting to get worse again.  Notes abdominal pain.  States he can only take Protonix a few times a week as it makes his symptoms worse if he takes it more often than this.  He wishes to undergo EGD with repeat dilation as he feels like his symptoms are getting bad again.  No longer taking NSAIDs if he can avoid them.   Past Medical History:  Diagnosis Date  . Diabetes mellitus without complication ([place])   . GERD (gastroesophageal reflux disease)   . Hyperlipidemia   . Hypertension   . IDA (iron deficiency anemia)   . Rash of entire body 03/2016    Past Surgical History:  Procedure Laterality Date  . BACK SURGERY  1993   lumbar  . BIOPSY  02/07/2019   Procedure: BIOPSY;  Surgeon: Danie Binder, MD;  [facility]: [facility];  Service: Endoscopy;;  . CERVICAL SPINE SURGERY  2000  . COLONOSCOPY N/A 02/07/2019   Dr. Oneida Alar: External hemorrhoids next colonoscopy in 10 years  . ESOPHAGOGASTRODUODENOSCOPY N/A 02/07/2019   Dr. Oneida Alar: Barrett's  esophagus without dysplasia chronic inactive gastritis but no H. 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 ESOPHAGOGASTRODUODENOSCOPY N/A 01/05/2020   Procedure: ESOPHAGOGASTRODUODENOSCOPY (EGD);  Surgeon: Danie Binder, MD;  Location: [facility];  Service: Endoscopy;  Laterality: N/A;  10:30am  . HAND SURGERY    . SAVORY DILATION N/A 01/05/2020   Procedure: SAVORY DILATION;  Surgeon: Danie Binder, MD;  Location: [facility];  Service: Endoscopy;  Laterality: N/A;    Current Outpatient Medications  Medication Sig Dispense Refill  . acetaminophen (TYLENOL) 500 MG tablet Take 1,000 mg by mouth in the morning and at bedtime.     Marland Kitchen amLODipine (NORVASC) 10 MG tablet Take 1 tablet (10 mg total) by mouth daily. 90 tablet 3  . aspirin 81 MG chewable tablet Chew 81 mg by mouth 2 (two) times daily.    Marland Kitchen atorvastatin (LIPITOR) 80 MG tablet Take 1 tablet (80 mg total) by mouth daily. 90 tablet 2  . Blood Glucose Monitoring Suppl (TRUE METRIX METER) w/Device KIT 1 each by Does not apply route 4 (four) times daily -  before meals and at bedtime. 1 kit 0  . glipiZIDE (GLUCOTROL) 5 MG tablet TAKE 1 TABLETS BY MOUTH IN THE MORNINGS AND 1 TABLET IN THE EVENINGS. 60 tablet 1  . glucose blood test  strip Use as instructed 100 each 12  . lisinopril (ZESTRIL) 40 MG tablet Take 1 tablet (40 mg total) by mouth daily. (Patient taking differently: Take 40 mg by mouth daily. Takes 20 mg in the morning and 20 mg in the evening.  40 mg total daily.) 90 tablet 2  . loratadine (CLARITIN) 10 MG tablet Take 10 mg by mouth daily.    . pantoprazole (PROTONIX) 40 MG tablet TAKE 1 TABLET (40 MG TOTAL) BY MOUTH 2 (TWO) TIMES DAILY BEFORE A MEAL. (Patient taking differently: Take 40 mg by mouth 2 (two) times a week.) 60 tablet 3  . Potassium 99 MG TABS Take 99 mg by mouth daily.     Marland Kitchen rOPINIRole (REQUIP) 0.5 MG tablet Take 1 tablet  (0.5 mg total) by mouth at bedtime. 90 tablet 1  . traMADol (ULTRAM) 50 MG tablet Take 1 tablet (50 mg total) by mouth every 12 (twelve) hours as needed. 60 tablet 1  . TRUEplus Lancets 28G MISC CHECK BLOOD SUGAR FASTING AND BEFORE MEALS AND AGAIN IF PT FEELS BAD (SYMPTOMS OF HYPO). 100 each 12   No current facility-administered medications for this visit.    Allergies as of 11/24/2020 - Review Complete 11/24/2020  Allergen Reaction Noted  . Cymbalta [duloxetine hcl] Other (See Comments) 06/05/2017  . Flomax [tamsulosin]  08/30/2020  . Other Nausea And Vomiting 01/24/2019  . Robaxin [methocarbamol] Other (See Comments) 06/02/2019    Family History  Problem Relation Age of Onset  . Pancreatic cancer Mother   . CAD Father   . Colon cancer Paternal Grandfather     Social History   Socioeconomic History  . Marital status: Significant Other    Spouse name: Not on file  . Number of children: Not on file  . Years of education: Not on file  . Highest education level: Not on file  Occupational History  . Occupation: Clinical biochemist  Tobacco Use  . Smoking status: Former Smoker    Quit date: 10/16/1988    Years since quitting: 32.1  . Smokeless tobacco: Never Used  Vaping Use  . Vaping Use: Never used  Substance and Sexual Activity  . Alcohol use: No  . Drug use: No  . Sexual activity: Not on file  Other Topics Concern  . Not on file  Social History Narrative   Volunteers as Airline pilot   Social Determinants of Radio broadcast assistant Strain: Not on file  Food Insecurity: Not on file  Transportation Needs: Not on file  Physical Activity: Not on file  Stress: Not on file  Social Connections: Not on file    Subjective: Review of Systems  Constitutional: Negative for chills and fever.  HENT: Negative for congestion and hearing loss.   Eyes: Negative for blurred vision and double vision.  Respiratory: Negative for cough and shortness of breath.   Cardiovascular:  Negative for chest pain and palpitations.  Gastrointestinal: Positive for abdominal pain and heartburn. Negative for blood in stool, constipation, diarrhea, melena and vomiting.  Genitourinary: Negative for dysuria and urgency.  Musculoskeletal: Negative for joint pain and myalgias.  Skin: Negative for itching and rash.  Neurological: Negative for dizziness and headaches.  Psychiatric/Behavioral: Negative for depression. The patient is not nervous/anxious.      Objective: BP (!) 157/68   Pulse 79   Temp 98.2 F (36.8 C) (Temporal)   Ht _0  (1.778 m)   Wt 232 lb 6.4 oz (105.4 kg)   BMI 33.35 kg/m  Physical  Exam Constitutional:      Appearance: Normal appearance.  HENT:     Head: Normocephalic and atraumatic.  Eyes:     Extraocular Movements: Extraocular movements intact.     Conjunctiva/sclera: Conjunctivae normal.  Cardiovascular:     Rate and Rhythm: Normal rate and regular rhythm.  Pulmonary:     Effort: Pulmonary effort is normal.     Breath sounds: Normal breath sounds.  Abdominal:     General: Bowel sounds are normal.     Palpations: Abdomen is soft.  Musculoskeletal:        General: Normal range of motion.     Cervical back: Normal range of motion and neck supple.  Skin:    General: Skin is warm.  Neurological:     General: No focal deficit present.     Mental Status: He is alert and oriented to person, place, and time.  Psychiatric:        Mood and Affect: Mood normal.        Behavior: Behavior normal.      Assessment: *Chronic reflux *Duodenal/pyloric stenosis *Barrett's esophagus without dysplasia *Diarrhea  Plan: Patient notes his symptoms are worsening again and is interested in undergoing repeat EGD with dilation. We will schedule this today.   The risks including infection, bleed, or perforation as well as benefits, limitations, alternatives and imponderables have been reviewed with the patient. Potential for esophageal dilation, biopsy,  etc. have also been reviewed.  Questions have been answered. All parties agreeable.  Continue on pantoprazole. Avoid NSAIDs as best as he can.  Further recommendations to follow.   11/25/2020 11:16 AM   Disclaimer: This note was dictated with voice recognition software. Similar sounding words can inadvertently be transcribed and may not be corrected upon review.

## 2020-12-03 ENCOUNTER — Other Ambulatory Visit (HOSPITAL_COMMUNITY)
Admission: RE | Admit: 2020-12-03 | Discharge: 2020-12-03 | Disposition: A | Discharge status: Home / Self Care | Payer: Self-pay | Source: Ambulatory Visit | Attending: Internal Medicine | Admitting: Internal Medicine

## 2020-12-03 ENCOUNTER — Encounter (HOSPITAL_COMMUNITY): Payer: Self-pay

## 2020-12-03 ENCOUNTER — Telehealth: Payer: Self-pay | Admitting: Cardiology

## 2020-12-03 ENCOUNTER — Telehealth: Payer: Self-pay | Admitting: Medical

## 2020-12-03 ENCOUNTER — Other Ambulatory Visit: Payer: Self-pay | Admitting: Cardiology

## 2020-12-03 ENCOUNTER — Other Ambulatory Visit: Payer: Self-pay

## 2020-12-03 DIAGNOSIS — Z01812 Encounter for preprocedural laboratory examination: Secondary | ICD-10-CM | POA: Insufficient documentation

## 2020-12-03 DIAGNOSIS — Z20822 Contact with and (suspected) exposure to covid-19: Secondary | ICD-10-CM | POA: Insufficient documentation

## 2020-12-03 LAB — SARS CORONAVIRUS 2 (TAT 6-24 HRS): SARS Coronavirus 2: NEGATIVE

## 2020-12-03 MED ORDER — HYDROCHLOROTHIAZIDE 25 MG PO TABS: 25.0000 mg | ORAL_TABLET | Freq: Every day | ORAL | 3 refills | Status: DC

## 2020-12-03 NOTE — Telephone Encounter (Signed)
Patient calling in regarding his Lisinopril. He is concerned because he has been having headaches, feels his heart beat in his teeth. Denies CP, SOB, N/V, swelling or diaphoresis.   Patient has been taking 20 mg BID but has increased to 60 mg daily due to recent symptoms. Symptoms have not gotten any better with the increase in Lisinopril.   He has been checking his BP daily and it is always  Most recent BP 168/88 HR 60 while supine at rest.   Patient admits that he may be a little dehydrated but denies any additional sodium or caffeine in his diet.   Will send to [facility], MD for advisement.

## 2020-12-03 NOTE — Telephone Encounter (Signed)
   Patient called the after hours line to report trouble obtaining his HCTZ which was called in today. His pharmacy closed prior to being able to fill the prescription. Rx sent to alternative pharmacy. Patient was appreciative of the call.   Beatriz Stallion, PA-C  12/03/20; 5:25 PM

## 2020-12-03 NOTE — Telephone Encounter (Signed)
    Pt is calling to follow up his mychart message about his BP, he said if he can get a call today or a response on his mychart. He would like to get his BP regulated before his procedure on Monday

## 2020-12-03 NOTE — Telephone Encounter (Signed)
Meriam Sprague, MD  You 3 minutes ago (4:42 PM)   Can we send him HCTZ 25mg  daily to his pharmacy and repeat BMET in 10 days    Follow up BMET ordered for Wed March 2nd-Patient aware.  New Rx sent to patients pharmacy of choice.  He is aware to call back with any new questions or concerns.

## 2020-12-06 ENCOUNTER — Other Ambulatory Visit: Payer: Self-pay

## 2020-12-06 ENCOUNTER — Ambulatory Visit (HOSPITAL_COMMUNITY): Payer: Self-pay | Admitting: Anesthesiology

## 2020-12-06 ENCOUNTER — Encounter (HOSPITAL_COMMUNITY): Payer: Self-pay | Admitting: *Deleted

## 2020-12-06 ENCOUNTER — Ambulatory Visit (HOSPITAL_COMMUNITY)
Admission: RE | Admit: 2020-12-06 | Discharge: 2020-12-06 | Disposition: A | Discharge status: Home / Self Care | Payer: Self-pay | Attending: Internal Medicine | Admitting: Internal Medicine

## 2020-12-06 ENCOUNTER — Encounter (HOSPITAL_COMMUNITY)
Admission: RE | Disposition: A | Discharge status: Home / Self Care | Payer: Self-pay | Source: Home / Self Care | Attending: Internal Medicine

## 2020-12-06 DIAGNOSIS — Z7982 Long term (current) use of aspirin: Secondary | ICD-10-CM | POA: Insufficient documentation

## 2020-12-06 DIAGNOSIS — R12 Heartburn: Secondary | ICD-10-CM | POA: Insufficient documentation

## 2020-12-06 DIAGNOSIS — Z87891 Personal history of nicotine dependence: Secondary | ICD-10-CM | POA: Insufficient documentation

## 2020-12-06 DIAGNOSIS — Z79899 Other long term (current) drug therapy: Secondary | ICD-10-CM | POA: Insufficient documentation

## 2020-12-06 DIAGNOSIS — K315 Obstruction of duodenum: Secondary | ICD-10-CM | POA: Insufficient documentation

## 2020-12-06 DIAGNOSIS — Z7984 Long term (current) use of oral hypoglycemic drugs: Secondary | ICD-10-CM | POA: Insufficient documentation

## 2020-12-06 HISTORY — PX: ESOPHAGOGASTRODUODENOSCOPY (EGD) WITH PROPOFOL: SHX5813

## 2020-12-06 LAB — GLUCOSE, CAPILLARY: Glucose-Capillary: 128 mg/dL — ABNORMAL HIGH (ref 70–99)

## 2020-12-06 SURGERY — ESOPHAGOGASTRODUODENOSCOPY (EGD) WITH PROPOFOL
Anesthesia: General

## 2020-12-06 MED ORDER — STERILE WATER FOR IRRIGATION IR SOLN
Status: DC | PRN
  Administered 2020-12-06: 100 mL

## 2020-12-06 MED ORDER — PROPOFOL 10 MG/ML IV BOLUS
INTRAVENOUS | Status: DC | PRN
  Administered 2020-12-06: 40 mg via INTRAVENOUS
  Administered 2020-12-06: 130 mg via INTRAVENOUS

## 2020-12-06 MED ORDER — LACTATED RINGERS IV SOLN: INTRAVENOUS | Status: DC

## 2020-12-06 MED ORDER — LIDOCAINE HCL (CARDIAC) PF 100 MG/5ML IV SOSY
PREFILLED_SYRINGE | INTRAVENOUS | Status: DC | PRN
  Administered 2020-12-06: 50 mg via INTRAVENOUS

## 2020-12-06 MED FILL — HYDROCHLOROTHIAZIDE 25 MG T: 25 | 30 days supply | Qty: 30 | Fill #0

## 2020-12-06 MED FILL — AMLODIPINE BESYLATE 10 MG T: 10 | 30 days supply | Qty: 30 | Fill #1

## 2020-12-06 MED FILL — PANTOPRAZOLE SOD DR 40 MG T: 40 | 30 days supply | Qty: 60 | Fill #1

## 2020-12-06 NOTE — Anesthesia Procedure Notes (Signed)
Date/Time: 12/06/2020 11:30 AM Performed by: Julian Reil, CRNA Pre-anesthesia Checklist: Patient identified, Emergency Drugs available, Suction available and Patient being monitored Patient Re-evaluated:Patient Re-evaluated prior to induction Oxygen Delivery Method: Nasal cannula Induction Type: IV induction Placement Confirmation: positive ETCO2

## 2020-12-06 NOTE — Anesthesia Postprocedure Evaluation (Signed)
Anesthesia Post Note  Patient: Jeremy [name]  Procedure(s) Performed: ESOPHAGOGASTRODUODENOSCOPY (EGD) WITH PROPOFOL (N/A )  Patient location during evaluation: Endoscopy Anesthesia Type: General Level of consciousness: awake and alert and oriented Pain management: pain level controlled Vital Signs Assessment: post-procedure vital signs reviewed and stable Respiratory status: spontaneous breathing, nonlabored ventilation and respiratory function stable Cardiovascular status: blood pressure returned to baseline and stable Postop Assessment: no apparent nausea or vomiting Anesthetic complications: no   No complications documented.   Last Vitals:  Vitals:   12/06/20 1021 12/06/20 1144  BP: 133/67 101/64  Pulse:  69  Resp: 17 (!) 23  Temp: 36.4 C 36.5 C  SpO2: 99% 98%    Last Pain:  Vitals:   12/06/20 1144  TempSrc: Oral  PainSc: 0-No pain                 Julian Reil

## 2020-12-06 NOTE — Interval H&P Note (Signed)
History and Physical Interval Note:  12/06/2020 10:45 AM  Jeremy [name]  has presented today for surgery, with the diagnosis of gerd.  The various methods of treatment have been discussed with the patient and family. After consideration of risks, benefits and other options for treatment, the patient has consented to  Procedure(s) with comments: ESOPHAGOGASTRODUODENOSCOPY (EGD) WITH PROPOFOL (N/A) - 11:45am BALLOON DILATION (N/A) as a surgical intervention.  The patient's history has been reviewed, patient examined, no change in status, stable for surgery.  I have reviewed the patient's chart and labs.  Questions were answered to the patient's satisfaction.     Lanelle Bal

## 2020-12-06 NOTE — Transfer of Care (Signed)
Immediate Anesthesia Transfer of Care Note  Patient: Jeremy [name]  Procedure(s) Performed: ESOPHAGOGASTRODUODENOSCOPY (EGD) WITH PROPOFOL (N/A )  Patient Location: Endoscopy Unit  Anesthesia Type:General  Level of Consciousness: drowsy  Airway & Oxygen Therapy: Patient Spontanous Breathing  Post-op Assessment: Report given to RN and Post -op Vital signs reviewed and stable  Post vital signs: Reviewed and stable  Last Vitals:  Vitals Value Taken Time  BP    Temp    Pulse    Resp    SpO2      Last Pain:  Vitals:   12/06/20 1127  TempSrc:   PainSc: 0-No pain      Patients Stated Pain Goal: 5 (12/06/20 1021)  Complications: No complications documented.

## 2020-12-06 NOTE — Op Note (Addendum)
[facility] Patient Name: Jeremy [name] Procedure Date: 12/06/2020 11:20 AM MRN: [id number] Date of Birth: 06/02/1961 Attending MD: Elon Alas. Abbey Chatters DO CSN: [id number] Age: 60 Admit Type: Outpatient Procedure:                Upper GI endoscopy Indications:              Epigastric abdominal pain, Heartburn Providers:                Elon Alas. Abbey Chatters, DO, Marcus Page, [place]                            Risa Grill, Technician Referring MD:              Medicines:                See the Anesthesia note for documentation of the                            administered medications Complications:            No immediate complications. Estimated Blood Loss:     Estimated blood loss: none. Procedure:                Pre-Anesthesia Assessment:                           - The anesthesia plan was to use monitored                            anesthesia care (MAC).                           After obtaining informed consent, the endoscope was                            passed under direct vision. Throughout the                            procedure, the patient's blood pressure, pulse, and                            oxygen saturations were monitored continuously. The                            [id number]-[id number] ([id number]) scope was introduced through the                            mouth, and advanced to the duodenal bulb. The upper                            GI endoscopy was accomplished without difficulty.                            The patient tolerated the procedure well. Scope In: 11:31:18 AM Scope Out: 11:38:27 AM Total Procedure Duration: 0 hours 7 minutes 9 seconds  Findings:      There is no endoscopic evidence of bleeding, areas  of erosion,       esophagitis, ulcerations or varices in the entire esophagus.      A large amount of food (residue) was found in the entire examined       stomach.      Food (residue) was found in the duodenal bulb.      An acquired benign-appearing, intrinsic  severe stenosis was found in the       first portion of the duodenum and was non-traversed as the endoscope       would not fit through. The large amound of food in dudoenal bulb made it       difficult to fully characterize the stricture. Impression:               - A large amount of food (residue) in the stomach.                           - Retained food in the duodenum.                           - Acquired duodenal stenosis.                           - No specimens collected. Moderate Sedation:      Per Anesthesia Care Recommendation:           - Patient has a contact number available for                            emergencies. The signs and symptoms of potential                            delayed complications were discussed with the                            patient. Return to normal activities tomorrow.                            Written discharge instructions were provided to the                            patient.                           - Resume previous diet.                           - Continue present medications.                           - Unable to fully charcaterize duodenal stricture                            on today's exam due to large amount of food and                            debris in the duodenal bulb. Dilation not attempted  due to poor visualization. Of note, endoscope would                            not fit through stricture. Continue on PPIs and                            avoid NSAIDs. We can consider repeat EGD in the                            near future and attempt dilation but patient will                            need to be on liquid diet only for 48 hours.                            Consider surgical referral. Will discuss more with                            patient on follow up visit. Procedure Code(s):        --- Professional ---                           [phone], Esophagogastroduodenoscopy, flexible,                             transoral; diagnostic, including collection of                            specimen(s) by brushing or washing, when performed                            (separate procedure) Diagnosis Code(s):        --- Professional ---                           K31.5, Obstruction of duodenum                           R10.13, Epigastric pain                           R12, Heartburn CPT copyright 2019 American Medical Association. All rights reserved. The codes documented in this report are preliminary and upon coder review may  be revised to meet current compliance requirements. Elon Alas. Abbey Chatters, DO [place] Abbey Chatters, DO 12/06/2020 11:52:06 AM This report has been signed electronically. Number of Addenda: 0

## 2020-12-06 NOTE — Anesthesia Preprocedure Evaluation (Addendum)
Anesthesia Evaluation  Patient identified by MRN, date of birth, ID band Patient awake    Reviewed: Allergy & Precautions, H&P , NPO status , Patient's Chart, lab work & pertinent test results, reviewed documented beta blocker date and time   Airway Mallampati: II  TM Distance: >3 FB Neck ROM: full    Dental  (+) Missing, Chipped, Dental Advisory Given, Loose, Poor Dentition,    Pulmonary sleep apnea , former smoker,    Pulmonary exam normal breath sounds clear to auscultation       Cardiovascular Exercise Tolerance: Good hypertension, + CAD   Rhythm:regular Rate:Normal     Neuro/Psych  Neuromuscular disease negative psych ROS   GI/Hepatic Neg liver ROS, GERD  ,  Endo/Other  negative endocrine ROSdiabetes  Renal/GU negative Renal ROS  negative genitourinary   Musculoskeletal   Abdominal   Peds  Hematology  (+) Blood dyscrasia, anemia ,   Anesthesia Other Findings   Reproductive/Obstetrics negative OB ROS                            Anesthesia Physical Anesthesia Plan  ASA: II  Anesthesia Plan: General   Post-op Pain Management:    Induction:   PONV Risk Score and Plan: Ondansetron  Airway Management Planned:   Additional Equipment:   Intra-op Plan:   Post-operative Plan:   Informed Consent: I have reviewed the patients History and Physical, chart, labs and discussed the procedure including the risks, benefits and alternatives for the proposed anesthesia with the patient or authorized representative who has indicated his/her understanding and acceptance.     Dental Advisory Given  Plan Discussed with: CRNA  Anesthesia Plan Comments:         Anesthesia Quick Evaluation

## 2020-12-06 NOTE — Discharge Instructions (Signed)
EGD Discharge instructions Please read the instructions outlined below and refer to this sheet in the next few weeks. These discharge instructions provide you with general information on caring for yourself after you leave the hospital. Your doctor may also give you specific instructions. While your treatment has been planned according to the most current medical practices available, unavoidable complications occasionally occur. If you have any problems or questions after discharge, please call your doctor. ACTIVITY  You may resume your regular activity but move at a slower pace for the next 24 hours.   Take frequent rest periods for the next 24 hours.   Walking will help expel (get rid of) the air and reduce the bloated feeling in your abdomen.   No driving for 24 hours (because of the anesthesia (medicine) used during the test).   You may shower.   Do not sign any important legal documents or operate any machinery for 24 hours (because of the anesthesia used during the test).  NUTRITION  Drink plenty of fluids.   You may resume your normal diet.   Begin with a light meal and progress to your normal diet.   Avoid alcoholic beverages for 24 hours or as instructed by your caregiver.  MEDICATIONS  You may resume your normal medications unless your caregiver tells you otherwise.  WHAT YOU CAN EXPECT TODAY  You may experience abdominal discomfort such as a feeling of fullness or gas pains.  FOLLOW-UP  Your doctor will discuss the results of your test with you.  SEEK IMMEDIATE MEDICAL ATTENTION IF ANY OF THE FOLLOWING OCCUR:  Excessive nausea (feeling sick to your stomach) and/or vomiting.   Severe abdominal pain and distention (swelling).   Trouble swallowing.   Temperature over 101 F (37.8 C).   Rectal bleeding or vomiting of blood.   I was unable to fully charcaterize duodenal stricture on today's exam due to large amount of food and debris in the duodenal bulb.  Dilation not attempted due to poor visualization. Of note, endoscope would not fit through stricture. Continue on PPIs and avoid NSAIDs as best as you can. We can consider repeat EGD in the near future and attempt dilation but you will likely need to be on liquid diet only for 48 hours prior to procedure. We may need to consider repeat evaluation by surgery.   I hope you have a great rest of your week!  Hennie Duos. Marletta Lor, D.O. Gastroenterology and Hepatology [facility]

## 2020-12-08 ENCOUNTER — Telehealth: Payer: Self-pay | Admitting: Internal Medicine

## 2020-12-08 NOTE — Telephone Encounter (Signed)
Pt had procedure on Monday. He asked when he needed to follow up. Please advise. I didn't see anything in the recommendation. [phone]

## 2020-12-08 NOTE — Telephone Encounter (Signed)
Phoned and spoke with the pt. I looked at the last office note and it is saying 4 months but when I told the pt he stated that Dr Marletta Lor told him one month because something about his procedure didn't go right. I advised the pt that I would put a note to you and call him back once you clarify with me. Pt sates it is ok to leave it on the answering machine if he doesn't pick up. Please advise.

## 2020-12-09 ENCOUNTER — Encounter (HOSPITAL_COMMUNITY): Payer: Self-pay | Admitting: Internal Medicine

## 2020-12-09 NOTE — Telephone Encounter (Signed)
I called and discussed case with patient.  He does not need follow-up with me currently.  I discussed case with Dr. Lovell Sheehan who agrees that surgery is likely the next best option.  I told patient to call Dr. Lovell Sheehan office and schedule appointment with him as he has been seen there previously.  Thank you

## 2020-12-09 NOTE — Telephone Encounter (Signed)
NOTED

## 2020-12-09 NOTE — Telephone Encounter (Signed)
Conversation: Advice Only (Newest Message First)  Jeremy Hawking, RN     12/03/20 4:50 PM Note Jeremy Sprague, MD  You 3 minutes ago (4:42 PM)   Can we send him HCTZ 25mg  daily to his pharmacy and repeat BMET in 10 days    Follow up BMET ordered for Wed March 2nd-Patient aware.  New Rx sent to patients pharmacy of choice.  He is aware to call back with any new questions or concerns

## 2020-12-13 MED FILL — traMADol HCL 50 MG TABS: 50 | 30 days supply | Qty: 60 | Fill #1

## 2020-12-13 MED FILL — ?ROPINIROLE HCL 0.5MG TAB: 0.5 | 30 days supply | Qty: 30 | Fill #3

## 2020-12-16 ENCOUNTER — Ambulatory Visit (INDEPENDENT_AMBULATORY_CARE_PROVIDER_SITE_OTHER): Payer: Self-pay | Admitting: General Surgery

## 2020-12-16 ENCOUNTER — Encounter: Payer: Self-pay | Admitting: General Surgery

## 2020-12-16 ENCOUNTER — Other Ambulatory Visit: Payer: Self-pay

## 2020-12-16 VITALS — BP 125/76 | HR 82 | Temp 97.8°F | Resp 12 | Ht 70.0 in | Wt 222.0 lb

## 2020-12-16 DIAGNOSIS — K311 Adult hypertrophic pyloric stenosis: Secondary | ICD-10-CM

## 2020-12-17 NOTE — Progress Notes (Signed)
Jeremy [name]; [id number]; Aug 21, 1961   HPI Patient is a 60 year old white male who was referred back to my care by Dr. Abbey Chatters of GI for evaluation and treatment of a gastric outlet obstruction.  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  He has chronic back pain.  He states that currently he takes Aleve because he has to take something for relief of his back pain.  He recently underwent an EGD and Dr. Abbey Chatters could not pass the endoscope through the pylorus.  No frank ulcerations were seen.  He states that recently, he has been having some nausea and vomiting and a decreased appetite due to fullness.  At the time of EGD, Dr. Abbey Chatters did note a dilated stomach with retained food.  He has not lost any significant weight recently.  He does have some epigastric pain with the fullness. Past Medical History:  Diagnosis Date  . Diabetes mellitus without complication ([place])   . GERD (gastroesophageal reflux disease)   . Hyperlipidemia   . Hypertension   . IDA (iron deficiency anemia)   . Rash of entire body 03/2016    Past Surgical History:  Procedure Laterality Date  . BACK SURGERY  1993   lumbar  . BIOPSY  02/07/2019   Procedure: BIOPSY;  Surgeon: Danie Binder, MD;  Location: [facility];  Service: Endoscopy;;  . CERVICAL SPINE SURGERY  2000  . COLONOSCOPY N/A 02/07/2019   Dr. Oneida Alar: External hemorrhoids next colonoscopy in 10 years  . ESOPHAGOGASTRODUODENOSCOPY N/A 02/07/2019   Dr. Oneida Alar: Barrett's esophagus without dysplasia chronic inactive gastritis but no H. 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 ESOPHAGOGASTRODUODENOSCOPY N/A 01/05/2020   Procedure: ESOPHAGOGASTRODUODENOSCOPY (EGD);  Surgeon: Danie Binder, MD;  [facility]: [facility];  Service: Endoscopy;  Laterality: N/A;  10:30am  . ESOPHAGOGASTRODUODENOSCOPY (EGD) WITH PROPOFOL N/A  12/06/2020   Procedure: ESOPHAGOGASTRODUODENOSCOPY (EGD) WITH PROPOFOL;  Surgeon: Eloise Harman, DO;  [facility];  Service: Endoscopy;  Laterality: N/A;  11:45am  . HAND SURGERY    . SAVORY DILATION N/A 01/05/2020   Procedure: SAVORY DILATION;  Surgeon: Danie Binder, MD;  [facility]: [facility];  Service: Endoscopy;  Laterality: N/A;    Family History  Problem Relation Age of Onset  . Pancreatic cancer Mother   . CAD Father   . Colon cancer Paternal Grandfather     Current Outpatient Medications on File Prior to Visit  Medication Sig Dispense Refill  . acetaminophen (TYLENOL) 500 MG tablet Take 1,000 mg by mouth in the morning and at bedtime.     Marland Kitchen amLODipine (NORVASC) 10 MG tablet Take 1 tablet (10 mg total) by mouth daily. (Patient taking differently: Take 10 mg by mouth every evening.) 90 tablet 3  . aspirin 81 MG chewable tablet Chew 81 mg by mouth every evening.    Marland Kitchen atorvastatin (LIPITOR) 80 MG tablet Take 1 tablet (80 mg total) by mouth daily. (Patient taking differently: Take 80 mg by mouth every evening.) 90 tablet 2  . Blood Glucose Monitoring Suppl (TRUE METRIX METER) w/Device KIT 1 each by Does not apply route 4 (four) times daily -  before meals and at bedtime. 1 kit 0  . glipiZIDE (GLUCOTROL) 5 MG tablet TAKE 1 TABLETS BY MOUTH IN THE MORNINGS AND 1 TABLET IN THE EVENINGS. (Patient taking differently: Take 5  mg by mouth in the morning and at bedtime. TAKE 1 TABLETS BY MOUTH IN THE MORNINGS AND 1 TABLET IN THE EVENINGS.) 60 tablet 1  . glucose blood test strip Use as instructed 100 each 12  . hydrochlorothiazide (HYDRODIURIL) 25 MG tablet Take 1 tablet (25 mg total) by mouth daily. 90 tablet 3  . lisinopril (ZESTRIL) 40 MG tablet Take 1 tablet (40 mg total) by mouth daily. (Patient taking differently: Take 20 mg by mouth in the morning and at bedtime.) 90 tablet 2  . loratadine (CLARITIN) 10 MG tablet Take 10 mg by mouth daily.    . magnesium oxide (MAG-OX)  400 MG tablet Take 400 mg by mouth daily.    . naproxen sodium (ALEVE) 220 MG tablet Take 220 mg by mouth in the morning and at bedtime.    . pantoprazole (PROTONIX) 40 MG tablet TAKE 1 TABLET (40 MG TOTAL) BY MOUTH 2 (TWO) TIMES DAILY BEFORE A MEAL. (Patient taking differently: Take 40 mg by mouth every Wednesday.) 60 tablet 3  . Potassium 99 MG TABS Take 99 mg by mouth every evening.    Marland Kitchen rOPINIRole (REQUIP) 0.5 MG tablet Take 1 tablet (0.5 mg total) by mouth at bedtime. 90 tablet 1  . traMADol (ULTRAM) 50 MG tablet Take 1 tablet (50 mg total) by mouth every 12 (twelve) hours as needed. (Patient taking differently: Take 50 mg by mouth 2 (two) times daily.) 60 tablet 1  . TRUEplus Lancets 28G MISC CHECK BLOOD SUGAR FASTING AND BEFORE MEALS AND AGAIN IF PT FEELS BAD (SYMPTOMS OF HYPO). 100 each 12   No current facility-administered medications on file prior to visit.    Allergies  Allergen Reactions  . Cymbalta [Duloxetine Hcl] Other (See Comments)    Caused anorgasmia  . Flomax [Tamsulosin]     Numbness in hands  . Other Nausea And Vomiting    Mayonnaise mustard ketchup  . Robaxin [Methocarbamol] Other (See Comments)    Had hiccups x 4 hours after taking    Social History   Substance and Sexual Activity  Alcohol Use No    Social History   Tobacco Use  Smoking Status Former Smoker  . Quit date: 10/16/1988  . Years since quitting: 32.1  Smokeless Tobacco Never Used    Review of Systems  Constitutional: Positive for chills.  HENT: Positive for sore throat.   Eyes: Positive for blurred vision and pain.  Respiratory: Positive for cough.   Cardiovascular: Negative.   Gastrointestinal: Positive for abdominal pain, heartburn, nausea and vomiting.  Genitourinary: Positive for frequency and urgency.  Musculoskeletal: Positive for back pain, joint pain and neck pain.  Skin: Negative.   Neurological: Positive for headaches.  Endo/Heme/Allergies: Negative.    Psychiatric/Behavioral: Negative.     Objective   Vitals:   12/16/20 1017  BP: 125/76  Pulse: 82  Resp: 12  Temp: 97.8 F (36.6 C)  SpO2: 98%    Physical Exam Vitals reviewed.  Constitutional:      Appearance: Normal appearance. He is not ill-appearing.  HENT:     Head: Normocephalic and atraumatic.  Cardiovascular:     Rate and Rhythm: Normal rate and regular rhythm.     Heart sounds: Normal heart sounds. No murmur heard. No friction rub. No gallop.   Pulmonary:     Effort: Pulmonary effort is normal. No respiratory distress.     Breath sounds: Normal breath sounds. No stridor. No wheezing, rhonchi or rales.  Abdominal:  General: Bowel sounds are normal. There is no distension.     Palpations: Abdomen is soft. There is no mass.     Tenderness: There is no abdominal tenderness. There is no guarding or rebound.     Hernia: No hernia is present.  Skin:    General: Skin is warm and dry.  Neurological:     Mental Status: He is alert and oriented to person, place, and time.   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 Abbey Chatters was unable to dilate the pylorus, will proceed with gastrojejunostomy.  The risks and benefits of the procedure including bleeding, infection, anastomotic leak, stricture, and the possible need for further surgery were fully explained to the patient, who gave informed consent.  The patient is scheduled for 12/27/2020.

## 2020-12-17 NOTE — H&P (Signed)
Jeremy [name]; [id number]; 01/24/1961   HPI Patient is a 59-year-old white male who was referred back to my care by Dr. Carver of GI for evaluation and treatment of a gastric outlet obstruction.  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  He has chronic back pain.  He states that currently he takes Aleve because he has to take something for relief of his back pain.  He recently underwent an EGD and Dr. Carver could not pass the endoscope through the pylorus.  No frank ulcerations were seen.  He states that recently, he has been having some nausea and vomiting and a decreased appetite due to fullness.  At the time of EGD, Dr. Carver did note a dilated stomach with retained food.  He has not lost any significant weight recently.  He does have some epigastric pain with the fullness. Past Medical History:  Diagnosis Date  . Diabetes mellitus without complication ([facility])   . GERD (gastroesophageal reflux disease)   . Hyperlipidemia   . Hypertension   . IDA (iron deficiency anemia)   . Rash of entire body 03/2016    Past Surgical History:  Procedure Laterality Date  . BACK SURGERY  1993   lumbar  . BIOPSY  02/07/2019   Procedure: BIOPSY;  Surgeon: Fields, Sandi L, MD;  [facility]: [facility];  Service: Endoscopy;;  . CERVICAL SPINE SURGERY  2000  . COLONOSCOPY N/A 02/07/2019   Dr. Fields: External hemorrhoids next colonoscopy in 10 years  . ESOPHAGOGASTRODUODENOSCOPY N/A 02/07/2019   Dr. Fields: Barrett's esophagus without dysplasia chronic inactive gastritis but no H. 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 ESOPHAGOGASTRODUODENOSCOPY N/A 01/05/2020   Procedure: ESOPHAGOGASTRODUODENOSCOPY (EGD);  Surgeon: Fields, Sandi L, MD;  [facility];  Service: Endoscopy;  Laterality: N/A;  10:30am  . ESOPHAGOGASTRODUODENOSCOPY (EGD) WITH PROPOFOL N/A  12/06/2020   Procedure: ESOPHAGOGASTRODUODENOSCOPY (EGD) WITH PROPOFOL;  Surgeon: Carver, Charles K, DO;  [facility];  Service: Endoscopy;  Laterality: N/A;  11:45am  . HAND SURGERY    . SAVORY DILATION N/A 01/05/2020   Procedure: SAVORY DILATION;  Surgeon: Fields, Sandi L, MD;  [facility];  Service: Endoscopy;  Laterality: N/A;    Family History  Problem Relation Age of Onset  . Pancreatic cancer Mother   . CAD Father   . Colon cancer Paternal Grandfather     Current Outpatient Medications on File Prior to Visit  Medication Sig Dispense Refill  . acetaminophen (TYLENOL) 500 MG tablet Take 1,000 mg by mouth in the morning and at bedtime.     . amLODipine (NORVASC) 10 MG tablet Take 1 tablet (10 mg total) by mouth daily. (Patient taking differently: Take 10 mg by mouth every evening.) 90 tablet 3  . aspirin 81 MG chewable tablet Chew 81 mg by mouth every evening.    . atorvastatin (LIPITOR) 80 MG tablet Take 1 tablet (80 mg total) by mouth daily. (Patient taking differently: Take 80 mg by mouth every evening.) 90 tablet 2  . Blood Glucose Monitoring Suppl (TRUE METRIX METER) w/Device KIT 1 each by Does not apply route 4 (four) times daily -  before meals and at bedtime. 1 kit 0  . glipiZIDE (GLUCOTROL) 5 MG tablet TAKE 1 TABLETS BY MOUTH IN THE MORNINGS AND 1 TABLET IN THE EVENINGS. (Patient taking differently: Take 5   mg by mouth in the morning and at bedtime. TAKE 1 TABLETS BY MOUTH IN THE MORNINGS AND 1 TABLET IN THE EVENINGS.) 60 tablet 1  . glucose blood test strip Use as instructed 100 each 12  . hydrochlorothiazide (HYDRODIURIL) 25 MG tablet Take 1 tablet (25 mg total) by mouth daily. 90 tablet 3  . lisinopril (ZESTRIL) 40 MG tablet Take 1 tablet (40 mg total) by mouth daily. (Patient taking differently: Take 20 mg by mouth in the morning and at bedtime.) 90 tablet 2  . loratadine (CLARITIN) 10 MG tablet Take 10 mg by mouth daily.    . magnesium oxide (MAG-OX)  400 MG tablet Take 400 mg by mouth daily.    . naproxen sodium (ALEVE) 220 MG tablet Take 220 mg by mouth in the morning and at bedtime.    . pantoprazole (PROTONIX) 40 MG tablet TAKE 1 TABLET (40 MG TOTAL) BY MOUTH 2 (TWO) TIMES DAILY BEFORE A MEAL. (Patient taking differently: Take 40 mg by mouth every Wednesday.) 60 tablet 3  . Potassium 99 MG TABS Take 99 mg by mouth every evening.    . rOPINIRole (REQUIP) 0.5 MG tablet Take 1 tablet (0.5 mg total) by mouth at bedtime. 90 tablet 1  . traMADol (ULTRAM) 50 MG tablet Take 1 tablet (50 mg total) by mouth every 12 (twelve) hours as needed. (Patient taking differently: Take 50 mg by mouth 2 (two) times daily.) 60 tablet 1  . TRUEplus Lancets 28G MISC CHECK BLOOD SUGAR FASTING AND BEFORE MEALS AND AGAIN IF PT FEELS BAD (SYMPTOMS OF HYPO). 100 each 12   No current facility-administered medications on file prior to visit.    Allergies  Allergen Reactions  . Cymbalta [Duloxetine Hcl] Other (See Comments)    Caused anorgasmia  . Flomax [Tamsulosin]     Numbness in hands  . Other Nausea And Vomiting    Mayonnaise mustard ketchup  . Robaxin [Methocarbamol] Other (See Comments)    Had hiccups x 4 hours after taking    Social History   Substance and Sexual Activity  Alcohol Use No    Social History   Tobacco Use  Smoking Status Former Smoker  . Quit date: 10/16/1988  . Years since quitting: 32.1  Smokeless Tobacco Never Used    Review of Systems  Constitutional: Positive for chills.  HENT: Positive for sore throat.   Eyes: Positive for blurred vision and pain.  Respiratory: Positive for cough.   Cardiovascular: Negative.   Gastrointestinal: Positive for abdominal pain, heartburn, nausea and vomiting.  Genitourinary: Positive for frequency and urgency.  Musculoskeletal: Positive for back pain, joint pain and neck pain.  Skin: Negative.   Neurological: Positive for headaches.  Endo/Heme/Allergies: Negative.    Psychiatric/Behavioral: Negative.     Objective   Vitals:   12/16/20 1017  BP: 125/76  Pulse: 82  Resp: 12  Temp: 97.8 F (36.6 C)  SpO2: 98%    Physical Exam Vitals reviewed.  Constitutional:      Appearance: Normal appearance. He is not ill-appearing.  HENT:     Head: Normocephalic and atraumatic.  Cardiovascular:     Rate and Rhythm: Normal rate and regular rhythm.     Heart sounds: Normal heart sounds. No murmur heard. No friction rub. No gallop.   Pulmonary:     Effort: Pulmonary effort is normal. No respiratory distress.     Breath sounds: Normal breath sounds. No stridor. No wheezing, rhonchi or rales.  Abdominal:       General: Bowel sounds are normal. There is no distension.     Palpations: Abdomen is soft. There is no mass.     Tenderness: There is no abdominal tenderness. There is no guarding or rebound.     Hernia: No hernia is present.  Skin:    General: Skin is warm and dry.  Neurological:     Mental Status: He is alert and oriented to person, place, and time.   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 Carver was unable to dilate the pylorus, will proceed with gastrojejunostomy.  The risks and benefits of the procedure including bleeding, infection, anastomotic leak, stricture, and the possible need for further surgery were fully explained to the patient, who gave informed consent.  The patient is scheduled for 12/27/2020. 

## 2020-12-20 MED FILL — ATORVASTATIN CALCIUM 80 MG: 80 | 30 days supply | Qty: 30 | Fill #3

## 2020-12-21 ENCOUNTER — Other Ambulatory Visit: Payer: Self-pay | Admitting: Internal Medicine

## 2020-12-21 ENCOUNTER — Encounter: Payer: Self-pay | Admitting: Internal Medicine

## 2020-12-21 ENCOUNTER — Other Ambulatory Visit: Payer: Self-pay

## 2020-12-21 ENCOUNTER — Ambulatory Visit: Payer: Self-pay | Attending: Internal Medicine | Admitting: Internal Medicine

## 2020-12-21 VITALS — BP 128/73 | HR 88 | Resp 16 | Wt 226.0 lb

## 2020-12-21 DIAGNOSIS — E1142 Type 2 diabetes mellitus with diabetic polyneuropathy: Secondary | ICD-10-CM

## 2020-12-21 DIAGNOSIS — E1169 Type 2 diabetes mellitus with other specified complication: Secondary | ICD-10-CM

## 2020-12-21 DIAGNOSIS — E669 Obesity, unspecified: Secondary | ICD-10-CM

## 2020-12-21 DIAGNOSIS — K311 Adult hypertrophic pyloric stenosis: Secondary | ICD-10-CM

## 2020-12-21 DIAGNOSIS — N521 Erectile dysfunction due to diseases classified elsewhere: Secondary | ICD-10-CM

## 2020-12-21 DIAGNOSIS — Z01818 Encounter for other preprocedural examination: Secondary | ICD-10-CM

## 2020-12-21 DIAGNOSIS — Z9989 Dependence on other enabling machines and devices: Secondary | ICD-10-CM

## 2020-12-21 DIAGNOSIS — G4733 Obstructive sleep apnea (adult) (pediatric): Secondary | ICD-10-CM

## 2020-12-21 DIAGNOSIS — I10 Essential (primary) hypertension: Secondary | ICD-10-CM

## 2020-12-21 LAB — GLUCOSE, POCT (MANUAL RESULT ENTRY): POC Glucose: 220 mg/dl — AB (ref 70–99)

## 2020-12-21 LAB — POCT GLYCOSYLATED HEMOGLOBIN (HGB A1C): HbA1c, POC (controlled diabetic range): 6.7 % (ref 0.0–7.0)

## 2020-12-21 MED ORDER — SILDENAFIL CITRATE 50 MG PO TABS: ORAL_TABLET | ORAL | 3 refills | Status: DC

## 2020-12-21 NOTE — Progress Notes (Signed)
Patient ID: Jeremy [name], male    DOB: 06-27-1961  MRN: [id number]  CC: Preoperative evaluation and chronic disease management  Subjective: Jeremy [name] is a 60 y.o. male who presents for preoperative evaluation and chronic disease management His concerns today include:  Pt with hx of DM, HL, HTN,GERD,CAD, IDA(Barrett's esophagus and moderate gastritis thought to be NSAID induced on EGD),pyloric stenosis,chronic LBP, ED, OSA, RLS,   Since last visit with me, he had a repeat EGD by the gastroenterologist.  He was found to have large amount of retained food in the stomach and some in the duodenum.  Duodenal stenosis still present.  Dilatation not attempted due to poor visualization.  He was referred to the surgeon Dr. Arnoldo Morale.  Dr. Arnoldo Morale as recommended gastrojejunostomy that is scheduled for next Monday. -Patient denies any problems waking up from anesthesia in the past. He has obstructive sleep apnea.  He reports compliance with using his CPAP machine every night.  HTN: Reports episode of dizziness and low blood pressure last week Wednesday.  Blood pressure at that time was 90/50.  Since then he started cutting the hydrochlorothiazide in half which means he is taking half of the 25 mg tablet.  Blood pressure since then has been good and dizziness has resolved.  Blood pressure yesterday at home was 121/78.  He is still taking Norvasc 10 mg and lisinopril 40 mg daily.  He eventually would like to get off blood pressure medications.  He denies any chest pains or shortness of breath.  No lower extremity edema. Had nl nuclear stress test in February of this year and echo echo in January showing LVH otherwise good heart function.  DM/obesity: not checking BS often.  Compliant with Glucotrol. Doing okay with eating habits.  No numbness in the extremities. Results for orders placed or performed in visit on 12/21/20  POCT glucose (manual entry)  Result Value Ref Range   POC Glucose 220 (A) 70  - 99 mg/dl  POCT glycosylated hemoglobin (Hb A1C)  Result Value Ref Range   Hemoglobin A1C     HbA1c POC (<> result, manual entry)     HbA1c, POC (prediabetic range)     HbA1c, POC (controlled diabetic range) 6.7 0.0 - 7.0 %   Complains of problems with getting and maintaining an erection for some time.  He feels it is due to his blood pressure medications.  Reports being on Viagra and in the past and still has some at home that are outdated.  Denies any issues/side effects when he took Viagra in the past.    HM: Reports having received 2 shots of the [place] COVID-19 vaccine last year.  Patient Active Problem List   Diagnosis Date Noted  . Primary osteoarthritis of both hips 11/15/2020  . Aortic atherosclerosis ([place]) 08/30/2020  . Coronary artery calcification 08/30/2020  . Bilateral carpal tunnel syndrome 06/07/2020  . Grief reaction 06/07/2020  . 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 Congenital hypertrophic pyloric stenosis   . Duodenal web   . Barrett's esophagus without dysplasia 05/19/2019  . OSA (obstructive sleep apnea) 04/30/2019  . Iron deficiency anemia 11/25/2018  . Phimosis of penis 11/19/2018  . Esophageal dysphagia 08/19/2018  . Benign prostatic hyperplasia with post-void dribbling 01/31/2018  . Absence of bladder continence 01/31/2018  . Obesity (BMI 30-39.9) 11/05/2017  . GERD (gastroesophageal reflux disease) 06/03/2017  . Chronic pain of both knees 06/03/2017  . Chronic lower back pain 06/03/2017  . Type 2 diabetes mellitus  without complication, without long-term current use of insulin ([place]) 04/12/2016  . Essential hypertension 04/12/2016     Current Outpatient Medications on File Prior to Visit  Medication Sig Dispense Refill  . acetaminophen (TYLENOL) 500 MG tablet Take 1,000 mg by mouth in the morning and at bedtime.     Marland Kitchen amLODipine (NORVASC) 10 MG tablet Take 1 tablet (10 mg total) by mouth daily. (Patient taking differently: Take 10 mg by mouth every  evening.) 90 tablet 3  . aspirin 81 MG chewable tablet Chew 81 mg by mouth every evening.    Marland Kitchen atorvastatin (LIPITOR) 80 MG tablet Take 1 tablet (80 mg total) by mouth daily. (Patient taking differently: Take 80 mg by mouth every evening.) 90 tablet 2  . Blood Glucose Monitoring Suppl (TRUE METRIX METER) w/Device KIT 1 each by Does not apply route 4 (four) times daily -  before meals and at bedtime. 1 kit 0  . glipiZIDE (GLUCOTROL) 5 MG tablet TAKE 1 TABLETS BY MOUTH IN THE MORNINGS AND 1 TABLET IN THE EVENINGS. (Patient taking differently: Take 5 mg by mouth in the morning and at bedtime. TAKE 1 TABLETS BY MOUTH IN THE MORNINGS AND 1 TABLET IN THE EVENINGS.) 60 tablet 1  . glucose blood test strip Use as instructed 100 each 12  . hydrochlorothiazide (HYDRODIURIL) 25 MG tablet Take 1 tablet (25 mg total) by mouth daily. 90 tablet 3  . lisinopril (ZESTRIL) 40 MG tablet Take 1 tablet (40 mg total) by mouth daily. (Patient taking differently: Take 20 mg by mouth in the morning and at bedtime.) 90 tablet 2  . loratadine (CLARITIN) 10 MG tablet Take 10 mg by mouth daily.    . magnesium oxide (MAG-OX) 400 MG tablet Take 400 mg by mouth daily. (Patient not taking: Reported on 12/21/2020)    . naproxen sodium (ALEVE) 220 MG tablet Take 220 mg by mouth in the morning and at bedtime.    . pantoprazole (PROTONIX) 40 MG tablet TAKE 1 TABLET (40 MG TOTAL) BY MOUTH 2 (TWO) TIMES DAILY BEFORE A MEAL. (Patient taking differently: Take 40 mg by mouth every Wednesday.) 60 tablet 3  . Potassium 99 MG TABS Take 99 mg by mouth every evening.    Marland Kitchen rOPINIRole (REQUIP) 0.5 MG tablet Take 1 tablet (0.5 mg total) by mouth at bedtime. 90 tablet 1  . traMADol (ULTRAM) 50 MG tablet Take 1 tablet (50 mg total) by mouth every 12 (twelve) hours as needed. (Patient taking differently: Take 50 mg by mouth 2 (two) times daily.) 60 tablet 1  . TRUEplus Lancets 28G MISC CHECK BLOOD SUGAR FASTING AND BEFORE MEALS AND AGAIN IF PT FEELS BAD  (SYMPTOMS OF HYPO). 100 each 12   No current facility-administered medications on file prior to visit.    Allergies  Allergen Reactions  . Cymbalta [Duloxetine Hcl] Other (See Comments)    Caused anorgasmia  . Flomax [Tamsulosin]     Numbness in hands  . Other Nausea And Vomiting    Mayonnaise mustard ketchup  . Robaxin [Methocarbamol] Other (See Comments)    Had hiccups x 4 hours after taking    Social History   Socioeconomic History  . Marital status: Significant Other    Spouse name: Not on file  . Number of children: Not on file  . Years of education: Not on file  . Highest education level: Not on file  Occupational History  . Occupation: Clinical biochemist  Tobacco Use  . Smoking status: Former  Smoker    Quit date: 10/16/1988    Years since quitting: 32.2  . Smokeless tobacco: Never Used  Vaping Use  . Vaping Use: Never used  Substance and Sexual Activity  . Alcohol use: No  . Drug use: No  . Sexual activity: Not on file  Other Topics Concern  . Not on file  Social History Narrative   Volunteers as Airline pilot   Social Determinants of Radio broadcast assistant Strain: Not on file  Food Insecurity: Not on file  Transportation Needs: Not on file  Physical Activity: Not on file  Stress: Not on file  Social Connections: Not on file  Intimate Partner Violence: Not on file    Family History  Problem Relation Age of Onset  . Pancreatic cancer Mother   . CAD Father   . Colon cancer Paternal Grandfather     Past Surgical History:  Procedure Laterality Date  . BACK SURGERY  1993   lumbar  . BIOPSY  02/07/2019   Procedure: BIOPSY;  Surgeon: Danie Binder, MD;  [facility];  Service: Endoscopy;;  . CERVICAL SPINE SURGERY  2000  . COLONOSCOPY N/A 02/07/2019   Dr. Oneida Alar: External hemorrhoids next colonoscopy in 10 years  . ESOPHAGOGASTRODUODENOSCOPY N/A 02/07/2019   Dr. Oneida Alar: Barrett's esophagus without dysplasia chronic inactive gastritis  but no H. 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 ESOPHAGOGASTRODUODENOSCOPY N/A 01/05/2020   Procedure: ESOPHAGOGASTRODUODENOSCOPY (EGD);  Surgeon: Danie Binder, MD;  [facility];  Service: Endoscopy;  Laterality: N/A;  10:30am  . ESOPHAGOGASTRODUODENOSCOPY (EGD) WITH PROPOFOL N/A 12/06/2020   Procedure: ESOPHAGOGASTRODUODENOSCOPY (EGD) WITH PROPOFOL;  Surgeon: Eloise Harman, DO;  [facility];  Service: Endoscopy;  Laterality: N/A;  11:45am  . HAND SURGERY    . SAVORY DILATION N/A 01/05/2020   Procedure: SAVORY DILATION;  Surgeon: Danie Binder, MD;  [facility];  Service: Endoscopy;  Laterality: N/A;    ROS: Review of Systems Negative except as stated above  PHYSICAL EXAM: BP 128/73   Pulse 88   Resp 16   Wt 226 lb (102.5 kg)   SpO2 97%   BMI 32.43 kg/m   Wt Readings from Last 3 Encounters:  12/21/20 226 lb (102.5 kg)  12/16/20 222 lb (100.7 kg)  12/06/20 237 lb (107.5 kg)    Physical Exam General appearance - alert, well appearing, and in no distress Mental status - normal mood, behavior, speech, dress, motor activity, and thought processes Eyes - pupils equal and reactive, extraocular eye movements intact Mouth - mucous membranes moist, pharynx normal without lesions Neck - supple, no significant adenopathy Chest - clear to auscultation, no wheezes, rales or rhonchi, symmetric air entry Heart - normal rate, regular rhythm, normal S1, S2, no murmurs, rubs, clicks or gallops Abdomen - soft, nontender, nondistended, no masses or organomegaly Extremities - peripheral pulses normal, no pedal edema, no clubbing or cyanosis  CMP Latest Ref Rng & Units 09/20/2020 07/23/2020 07/22/2020  Glucose 65 - 99 mg/dL 128(H) 130(H) 183(H)  BUN 6 - 24 mg/dL 14 17 13   Creatinine 0.76 - 1.27 mg/dL 0.86 0.91 0.94  Sodium 134 - 144 mmol/L 141 137 139  Potassium  3.5 - 5.2 mmol/L 4.0 3.6 3.9  Chloride 96 - 106 mmol/L 106 98 101  CO2 20 - 29 mmol/L 25 28 27   Calcium 8.7 - 10.2 mg/dL 9.2 9.7 10.1  Total Protein 6.5 - 8.1 g/dL - 8.0 7.4  Total Bilirubin 0.3 - 1.2 mg/dL - 0.7 0.6  Alkaline Phos 38 - 126 U/L - 54 54  AST 15 - 41 U/L - 29 26  ALT 0 - 44 U/L - 26 23   Lipid Panel     Component Value Date/Time   CHOL 150 02/05/2020 1442   TRIG 168 (H) 02/05/2020 1442   HDL 40 02/05/2020 1442   CHOLHDL 3.8 02/05/2020 1442   CHOLHDL 4.8 10/17/2016 1012   VLDL NOT CALC 10/17/2016 1012   LDLCALC 81 02/05/2020 1442    CBC    Component Value Date/Time   WBC 5.5 09/20/2020 1642   WBC 10.4 07/23/2020 1808   RBC 4.57 09/20/2020 1642   RBC 5.09 07/23/2020 1808   HGB 13.4 09/20/2020 1642   HCT 39.2 09/20/2020 1642   PLT 198 09/20/2020 1642   MCV 86 09/20/2020 1642   MCH 29.3 09/20/2020 1642   MCH 29.3 07/23/2020 1808   MCHC 34.2 09/20/2020 1642   MCHC 32.1 07/23/2020 1808   RDW 12.5 09/20/2020 1642   LYMPHSABS 1.4 01/05/2020 1157   LYMPHSABS 2.0 08/13/2019 1617   MONOABS 0.5 01/05/2020 1157   EOSABS 0.3 01/05/2020 1157   EOSABS 0.4 08/13/2019 1617   BASOSABS 0.1 01/05/2020 1157   BASOSABS 0.1 08/13/2019 1617    ASSESSMENT AND PLAN: 1. Preoperative evaluation to rule out surgical contraindication Patient is clinically optimized for surgery.  He has had recent cardiac testing that came back good.  Advised that he will be given instructions on which medicines to hold preoperatively on his preop check that was scheduled for this Friday.  2. Type 2 diabetes mellitus with obesity ([place]) Control.  Encouraged him to continue healthy eating habits and try to move as much as he can. - POCT glucose (manual entry) - POCT glycosylated hemoglobin (Hb A1C)  3. Essential hypertension At goal.  Continue Norvasc, lisinopril and hydrochlorothiazide 25 mg half tablet daily.  4. Erectile dysfunction associated with type 2 diabetes mellitus ([place]) Patient  wanting to be restarted on Viagra. Pt advised of possible side effects of this medication including prolong erection, flushing, headaches, stuff nose and sudden vision and hearing changes.  Pt told to be seen in ER if he has erection lasting longer than 3-4 hrs, or if he has sudden vision changes or hearing loss. - sildenafil (VIAGRA) 50 MG tablet; 1 tab 1/2 hr prior to sex.  Limit use to 1 tab/24 hrs  Dispense: 10 tablet; Refill: 3  5. OSA on CPAP Compliant with CPAP.  Encouraged him to continue to use it every night.  6. Pyloric stenosis Surgery planned for next Monday to correct this issue.    Patient was given the opportunity to ask questions.  Patient verbalized understanding of the plan and was able to repeat key elements of the plan.   Orders Placed This Encounter  Procedures  . POCT glucose (manual entry)  . POCT glycosylated hemoglobin (Hb A1C)     Requested Prescriptions   Signed Prescriptions Disp Refills  . sildenafil (VIAGRA) 50 MG tablet 10 tablet 3    Sig: 1 tab 1/2 hr prior to sex.  Limit use to 1 tab/24 hrs    Return in about 4 months (around 04/22/2021).  Karle Plumber, MD, FACP

## 2020-12-21 NOTE — Progress Notes (Signed)
Pt states his pain is coming from right hip, lower back, right upper abdomen

## 2020-12-22 MED FILL — SILDENAFIL CITRATE 50 MG TA: 50 | 30 days supply | Qty: 10 | Fill #0

## 2020-12-23 NOTE — Patient Instructions (Signed)
Jeremy [name]  12/23/2020     @PREFPERIOPPHARMACY @   Your procedure is scheduled on  12/27/2020.   Report to [facility] at  1030  A.M.   Call this number if you have problems the morning of surgery:  [phone]   Remember:  Do not eat or drink after midnight.                        Take these medicines the morning of surgery with A SIP OF WATER loratadine, tramadol.  DO NOT take any medications for diabetes the morning of your procedure.  If your glucose is 70 or below the morning of your procedure, drink 1/2 cup of clear juice and recheck your glucose in 15 minutes. If your glucose is still 70 or below, call [phone] for instructions.  If your glucose is 300 or above the morning of your procedure, call [phone] for instructions.       Do not wear jewelry, make-up or nail polish.  Do not wear lotions, powders, or perfumes, or deodorant.  Do not shave 48 hours prior to surgery.  Men may shave face and neck.  Do not bring valuables to the hospital.  [facility] is not responsible for any belongings or valuables.   Contacts, dentures or bridgework may not be worn into surgery.  Leave your suitcase in the car.  After surgery it may be brought to your room.  For patients admitted to the hospital, discharge time will be determined by your treatment team.  Patients discharged the day of surgery will not be allowed to drive home and must have someone with them for 24 hours.  Place clean sheets on your bed the night before your procedure and DO NOT sleep with pets this night.  Shower with CHG the night before and the morning of your procedure. DO NOT use CHG on your face, hair or genitals.  After each shower, dry off with a clean towel, put on clean, comfortable clothes and brush your teeth.     Special instructions:  DO NOT smoke tobacco or vape the morning of your procedure.  Please read over the following fact sheets that you were  given. Coughing and Deep Breathing, Surgical Site Infection Prevention, Anesthesia Post-op Instructions and Care and Recovery After Surgery       Incision Care, Adult An incision is a cut that a doctor makes in your skin for surgery. Most times, these cuts are closed after surgery. Your cut from surgery may be closed with:  Stitches (sutures).  Staples.  Skin glue.  Skin tape (adhesive) strips. You may need to return to your doctor to have stitches or staples taken out. This may happen many days or many weeks after your surgery. You need to take good care of your cut so it does not get infected. Follow instructions from your doctor about how to care for your cut. Supplies needed:  Soap and water.  A clean hand towel.  Wound cleanser.  A clean bandage (dressing), if needed.  Cream or ointment, if told by your doctor.  Clean gauze. How to care for your cut from surgery Cleaning your cut Ask your doctor how to clean your cut. You may need to:  Use mild soap and water, or a wound cleanser.  Use a clean gauze to pat your cut dry after you clean it. Changing your bandage  Wash your hands with soap  and water for at least 20 seconds before and after you change your bandage. If you cannot use soap and water, use hand sanitizer.  Change your bandage as told by your doctor.  Leave stitches, staples, skin glue, or skin tape strips in place. They may need to stay in place for 2 weeks or longer. If tape strips get loose and curl up, you may trim the loose edges. Do not remove tape strips completely unless your doctor says it is okay.  Put a cream or ointment on your cut. Do this only as told.  Cover your cut with a clean bandage.  Ask your doctor when you can leave your cut uncovered. Checking for infection Check your cut area every day for signs of infection. Check for:  More redness, swelling, or pain.  More fluid or blood.  Warmth.  Pus or a bad smell.   Follow these  instructions at home Medicines  Take over-the-counter and prescription medicines only as told by your doctor.  If you were prescribed an antibiotic medicine, cream, or ointment, use it as told by your doctor. Do not stop using the antibiotic even if your condition improves. Eating and drinking  Eat foods that have a lot of certain nutrients, such as protein, vitamin A, and vitamin C. These foods help your cut heal. ? Foods rich in protein include meat, fish, eggs, dairy, beans, and nuts. ? Foods rich in vitamin A include carrots and dark green, leafy vegetables. ? Foods rich in vitamin C include citrus fruits, tomatoes, broccoli, and peppers.  Drink enough fluid to keep your pee (urine) pale yellow. General instructions  Do not take baths, swim, use a hot tub, or put your cut underwater until your doctor approves. Ask your doctor if you may take showers. You may only be allowed to take sponge baths.  Limit movement around your cut. This helps with healing. ? Try not to strain, lift, or exercise for the first 2 weeks, or for as long as told by your doctor. ? Return to your normal activities as told by your doctor. Ask your doctor what activities are safe for you.  Do not scratch or pick at your cut. Keep it covered as told by your doctor.  Protect your cut from the sun when you are outside for the first 6 months, or for as long as told by your doctor. Cover up the scar area or put on sunscreen that has an SPF of at least 30.  Do not use any products that contain nicotine or tobacco, such as cigarettes, e-cigarettes, and chewing tobacco. These can delay cut healing after surgery. If you need help quitting, ask your doctor.  Keep all follow-up visits as told by your doctor. This is important.   Contact a doctor if:  You have any of these signs of infection around your cut: ? More redness, swelling, or pain. ? More fluid or blood. ? Warmth. ? Pus or a bad smell.  You have a  fever.  You feel like you may vomit (nauseous).  You vomit.  You are dizzy.  Your stitches, staples, skin glue, or tape strips come undone. Get help right away if:  Your cut has a red streak coming from it.  Your cut bleeds through your bandage, and bleeding does not stop with gentle pressure.  Your cut opens up and comes apart.  Your body reacts very badly to an infection. This may include: ? A fever, chills, or feeling cold. ?  Feeling mixed up, worried, or nervous. ? Very bad pain. ? Trouble breathing. ? A fast heartbeat. ? Clammy or sweaty skin. ? A rash. These symptoms may be an emergency. Do not wait to see if the symptoms will go away. Get medical help right away. Call your local emergency services (911 in the [place] Do not drive yourself to the hospital. Summary  Follow instructions from your doctor about how to care for your cut from surgery.  Wash your hands with soap and water for at least 20 seconds before and after you change your bandage. If you cannot use soap and water, use hand sanitizer.  Check your cut area every day for signs of infection.  Keep all follow-up visits as told by your doctor. This is important. This information is not intended to replace advice given to you by your health care provider. Make sure you discuss any questions you have with your health care provider. Document Revised: 07/23/2019 Document Reviewed: 07/23/2019 Elsevier Patient Education  2021 [employer] General Anesthesia, Adult, Care After This sheet gives you information about how to care for yourself after your procedure. Your health care provider may also give you more specific instructions. If you have problems or questions, contact your health care provider. What can I expect after the procedure? After the procedure, the following side effects are common:  Pain or discomfort at the IV site.  Nausea.  Vomiting.  Sore throat.  Trouble concentrating.  Feeling cold  or chills.  Feeling weak or tired.  Sleepiness and fatigue.  Soreness and body aches. These side effects can affect parts of the body that were not involved in surgery. Follow these instructions at home: For the time period you were told by your health care provider:  Rest.  Do not participate in activities where you could fall or become injured.  Do not drive or use machinery.  Do not drink alcohol.  Do not take sleeping pills or medicines that cause drowsiness.  Do not make important decisions or sign legal documents.  Do not take care of children on your own.   Eating and drinking  Follow any instructions from your health care provider about eating or drinking restrictions.  When you feel hungry, start by eating small amounts of foods that are soft and easy to digest (bland), such as toast. Gradually return to your regular diet.  Drink enough fluid to keep your urine pale yellow.  If you vomit, rehydrate by drinking water, juice, or clear broth. General instructions  If you have sleep apnea, surgery and certain medicines can increase your risk for breathing problems. Follow instructions from your health care provider about wearing your sleep device: ? Anytime you are sleeping, including during daytime naps. ? While taking prescription pain medicines, sleeping medicines, or medicines that make you drowsy.  Have a responsible adult stay with you for the time you are told. It is important to have someone help care for you until you are awake and alert.  Return to your normal activities as told by your health care provider. Ask your health care provider what activities are safe for you.  Take over-the-counter and prescription medicines only as told by your health care provider.  If you smoke, do not smoke without supervision.  Keep all follow-up visits as told by your health care provider. This is important. Contact a health care provider if:  You have nausea or  vomiting that does not get better with medicine.  You cannot eat  or drink without vomiting.  You have pain that does not get better with medicine.  You are unable to pass urine.  You develop a skin rash.  You have a fever.  You have redness around your IV site that gets worse. Get help right away if:  You have difficulty breathing.  You have chest pain.  You have blood in your urine or stool, or you vomit blood. Summary  After the procedure, it is common to have a sore throat or nausea. It is also common to feel tired.  Have a responsible adult stay with you for the time you are told. It is important to have someone help care for you until you are awake and alert.  When you feel hungry, start by eating small amounts of foods that are soft and easy to digest (bland), such as toast. Gradually return to your regular diet.  Drink enough fluid to keep your urine pale yellow.  Return to your normal activities as told by your health care provider. Ask your health care provider what activities are safe for you. This information is not intended to replace advice given to you by your health care provider. Make sure you discuss any questions you have with your health care provider. Document Revised: 06/17/2020 Document Reviewed: 01/15/2020 Elsevier Patient Education  2021 [employer]

## 2020-12-24 ENCOUNTER — Encounter (HOSPITAL_COMMUNITY)
Admission: RE | Admit: 2020-12-24 | Discharge: 2020-12-24 | Disposition: A | Discharge status: Home / Self Care | Payer: Self-pay | Source: Ambulatory Visit | Attending: General Surgery | Admitting: General Surgery

## 2020-12-24 ENCOUNTER — Other Ambulatory Visit (HOSPITAL_COMMUNITY): Payer: Self-pay

## 2020-12-28 ENCOUNTER — Other Ambulatory Visit: Payer: Self-pay | Admitting: Internal Medicine

## 2020-12-28 ENCOUNTER — Ambulatory Visit: Payer: Self-pay | Admitting: Internal Medicine

## 2020-12-28 DIAGNOSIS — E119 Type 2 diabetes mellitus without complications: Secondary | ICD-10-CM

## 2020-12-28 NOTE — Telephone Encounter (Signed)
Requested Prescriptions  Pending Prescriptions Disp Refills  . glipiZIDE (GLUCOTROL) 5 MG tablet [Pharmacy Med Name: GLIPIZIDE 5MG  TABLET 5 Tablet] 60 tablet 1    Sig: TAKE 1 TABLET BY MOUTH IN THE MORNINGS AND 1 TABLET IN THE EVENINGS.     Endocrinology:  Diabetes - Sulfonylureas Passed - 12/28/2020  8:28 AM      Passed - HBA1C is between 0 and 7.9 and within 180 days    HbA1c, POC (prediabetic range)  Date Value Ref Range Status  09/05/2019 6.3 5.7 - 6.4 % Corrected   HbA1c, POC (controlled diabetic range)  Date Value Ref Range Status  12/21/2020 6.7 0.0 - 7.0 % Final         Passed - Valid encounter within last 6 months    Recent Outpatient Visits          1 week ago Preoperative evaluation to rule out surgical contraindication   [facility], MD   1 month ago Acute pain of right knee   [facility], MD   4 months ago Type 2 diabetes mellitus with diabetic polyneuropathy, without long-term current use of insulin (HCC)   [facility], MD   5 months ago Acute abdominal pain   [facility], MD   6 months ago Type 2 diabetes mellitus with diabetic polyneuropathy, without long-term current use of insulin [facility])   [facility], MD      Future Appointments            In 2 weeks Marcine Matar, Shari Prows, MD [facility], [facility]

## 2020-12-31 ENCOUNTER — Encounter (HOSPITAL_COMMUNITY): Payer: Self-pay

## 2020-12-31 ENCOUNTER — Encounter (HOSPITAL_COMMUNITY)
Admission: RE | Admit: 2020-12-31 | Discharge: 2020-12-31 | Disposition: A | Discharge status: Home / Self Care | Payer: Self-pay | Source: Ambulatory Visit | Attending: General Surgery | Admitting: General Surgery

## 2020-12-31 ENCOUNTER — Ambulatory Visit (HOSPITAL_COMMUNITY)
Admission: RE | Admit: 2020-12-31 | Discharge: 2020-12-31 | Disposition: A | Discharge status: Home / Self Care | Payer: Self-pay | Source: Ambulatory Visit | Attending: General Surgery | Admitting: General Surgery

## 2020-12-31 ENCOUNTER — Encounter: Payer: Self-pay | Admitting: General Surgery

## 2020-12-31 ENCOUNTER — Other Ambulatory Visit: Payer: Self-pay

## 2020-12-31 ENCOUNTER — Other Ambulatory Visit (HOSPITAL_COMMUNITY)
Admission: RE | Admit: 2020-12-31 | Discharge: 2020-12-31 | Disposition: A | Discharge status: Home / Self Care | Payer: Self-pay | Source: Ambulatory Visit | Attending: General Surgery | Admitting: General Surgery

## 2020-12-31 DIAGNOSIS — Z981 Arthrodesis status: Secondary | ICD-10-CM | POA: Insufficient documentation

## 2020-12-31 DIAGNOSIS — Z20822 Contact with and (suspected) exposure to covid-19: Secondary | ICD-10-CM | POA: Insufficient documentation

## 2020-12-31 DIAGNOSIS — Z01818 Encounter for other preprocedural examination: Secondary | ICD-10-CM | POA: Insufficient documentation

## 2020-12-31 DIAGNOSIS — K311 Adult hypertrophic pyloric stenosis: Secondary | ICD-10-CM | POA: Insufficient documentation

## 2020-12-31 LAB — CBC WITH DIFFERENTIAL/PLATELET
Abs Immature Granulocytes: 0.06 10*3/uL (ref 0.00–0.07)
Basophils Absolute: 0.1 10*3/uL (ref 0.0–0.1)
Basophils Relative: 1 %
Eosinophils Absolute: 0.2 10*3/uL (ref 0.0–0.5)
Eosinophils Relative: 3 %
HCT: 35.1 % — ABNORMAL LOW (ref 39.0–52.0)
Hemoglobin: 11.5 g/dL — ABNORMAL LOW (ref 13.0–17.0)
Immature Granulocytes: 1 %
Lymphocytes Relative: 17 %
Lymphs Abs: 1.4 10*3/uL (ref 0.7–4.0)
MCH: 29.3 pg (ref 26.0–34.0)
MCHC: 32.8 g/dL (ref 30.0–36.0)
MCV: 89.5 fL (ref 80.0–100.0)
Monocytes Absolute: 0.6 10*3/uL (ref 0.1–1.0)
Monocytes Relative: 7 %
Neutro Abs: 6 10*3/uL (ref 1.7–7.7)
Neutrophils Relative %: 71 %
Platelets: 208 10*3/uL (ref 150–400)
RBC: 3.92 MIL/uL — ABNORMAL LOW (ref 4.22–5.81)
RDW: 13.6 % (ref 11.5–15.5)
WBC: 8.3 10*3/uL (ref 4.0–10.5)
nRBC: 0 % (ref 0.0–0.2)

## 2020-12-31 LAB — HEMOGLOBIN A1C
Hgb A1c MFr Bld: 7.4 % — ABNORMAL HIGH (ref 4.8–5.6)
Mean Plasma Glucose: 165.68 mg/dL

## 2020-12-31 LAB — COMPREHENSIVE METABOLIC PANEL
ALT: 22 U/L (ref 0–44)
AST: 22 U/L (ref 15–41)
Albumin: 3.6 g/dL (ref 3.5–5.0)
Alkaline Phosphatase: 60 U/L (ref 38–126)
Anion gap: 11 (ref 5–15)
BUN: 15 mg/dL (ref 6–20)
CO2: 23 mmol/L (ref 22–32)
Calcium: 9 mg/dL (ref 8.9–10.3)
Chloride: 103 mmol/L (ref 98–111)
Creatinine, Ser: 0.73 mg/dL (ref 0.61–1.24)
GFR, Estimated: >60 mL/min (ref >60)
Glucose, Bld: 122 mg/dL — ABNORMAL HIGH (ref 70–99)
Potassium: 3.5 mmol/L (ref 3.5–5.1)
Sodium: 137 mmol/L (ref 135–145)
Total Bilirubin: 0.5 mg/dL (ref 0.3–1.2)
Total Protein: 6.7 g/dL (ref 6.5–8.1)

## 2020-12-31 LAB — TYPE AND SCREEN
ABO/RH(D): A POS
Antibody Screen: NEGATIVE

## 2020-12-31 NOTE — Patient Instructions (Signed)
KIYAAN HAQ  12/31/2020     @PREFPERIOPPHARMACY @   Your procedure is scheduled on 01/03/2021.  Report to 01/05/2021 at 8:50 A.M.  Call this number if you have problems the morning of surgery:  [phone]   Remember:  Do not eat or drink after midnight.     Take these medicines the morning of surgery with A SIP OF WATER : Norvasc, Claritin, Protonix, Requip and Ultram.  . Do not take any diabetic medications the morning of the procedure.     Do not wear jewelry, make-up or nail polish.  Do not wear lotions, powders, or perfumes, or deodorant.  Do not shave 48 hours prior to surgery.  Men may shave face and neck.  Do not bring valuables to the hospital.  [facility] is not responsible for any belongings or valuables.  Contacts, dentures or bridgework may not be worn into surgery.  Leave your suitcase in the car.  After surgery it may be brought to your room.  For patients admitted to the hospital, discharge time will be determined by your treatment team.  Patients discharged the day of surgery will not be allowed to drive home.   Name and phone number of your driver:   family Special instructions:  n/a  Please read over the following fact sheets that you were given. Care and Recovery After Surgery    Percutaneous Endoscopic Jejunostomy  A percutaneous endoscopic jejunostomy is a procedure to place a feeding tube in the upper part of the small intestine (jejunum). You may have this procedure if you cannot tolerate tube feedings into your stomach. Tell a health care provider about:  Any allergies you have.  All medicines you are taking, including vitamins, herbs, eye drops, creams, and over-the-counter medicines.  Any problems you or members of your family have had with anesthetic medicines.  Any blood disorders you have.  Any surgeries you have had.  Any medical conditions you have.  Whether you are pregnant or may be pregnant. What are the  risks? Generally, this is a safe procedure. However, problems may occur, including:  Infection.  Bleeding.  Damage to nearby structures or organs.  Allergic reactions to medicines.  Leaking around the feeding tube.  Tearing (perforation) of the jejunum. What happens before the procedure? Staying hydrated Follow instructions from your health care provider about hydration, which may include:  Up to 2 hours before the procedure - you may continue to drink clear liquids, such as water, clear fruit juice, black coffee, and plain tea.   Eating and drinking restrictions Follow instructions from your health care provider about eating and drinking, which may include:  8 hours before the procedure - stop eating heavy meals or foods, such as meat, fried foods, or fatty foods.  6 hours before the procedure - stop eating light meals or foods, such as toast or cereal.  6 hours before the procedure - stop drinking milk or drinks that contain milk.  2 hours before the procedure - stop drinking clear liquids. Medicines Ask your health care provider about:  Changing or stopping your regular medicines. This is especially important if you are taking diabetes medicines or blood thinners.  Taking medicines such as aspirin and ibuprofen. These medicines can thin your blood. Do not take these medicines unless your health care provider tells you to take them.  Taking over-the-counter medicines, vitamins, herbs, and supplements. General instructions  Do not use any products that contain nicotine or tobacco for at  least 4 weeks before the procedure. These products include cigarettes, e-cigarettes, and chewing tobacco. If you need help quitting, ask your health care provider.  Ask your health care provider what steps will be taken to help prevent infection. These steps may include: ? Washing skin with a germ-killing soap. ? Receiving antibiotic medicine.  Plan to have someone take you home from the  hospital or clinic and stay with you for 24 hours. What happens during the procedure?  An IV may be inserted into one of your veins.  You will be given one or more of the following: ? A medicine to help you relax (sedative). ? A medicine to numb the area (local anesthetic). ? A medicine to make you fall asleep (general anesthetic).  A long, thin, flexible telescope (endoscope) will be passed into your mouth, through your stomach, and into your jejunum.  A small incision will be made in your abdomen.  A feeding tube will be placed through the incision.  The endoscope will be used to guide the feeding tube into the correct position.  The feeding tube insertion site will be checked for leaking or bleeding.  A bandage (dressing) may be placed around the feeding tube. The procedure may vary among health care providers and hospitals. What happens after the procedure?  Your blood pressure, heart rate, breathing rate, and blood oxygen level will be monitored until you leave the hospital or clinic.  You will be given supplies or told where to get supplies for feedings and for caring for your tube. You will be taught how to care for the tube.  If you were given a sedative during the procedure, it can affect you for several hours. Do not drive or operate machinery until your health care provider says that it is safe. Summary  Percutaneous endoscopic jejunostomy is a procedure to place a feeding tube through the skin into the upper part of the small intestine (jejunum).  You may have this procedure if you cannot tolerate tube feedings into your stomach.  You will be taught how to care for the feeding tube. This information is not intended to replace advice given to you by your health care provider. Make sure you discuss any questions you have with your health care provider. Document Revised: 09/18/2019 Document Reviewed: 09/18/2019 Elsevier Patient Education  2021 Elsevier  Inc.    General Anesthesia, Adult General anesthesia is the use of medicines to make a person "go to sleep" (unconscious) for a medical procedure. General anesthesia must be used for certain procedures, and is often recommended for procedures that:  Last a long time.  Require you to be still or in an unusual position.  Are major and can cause blood loss. The medicines used for general anesthesia are called general anesthetics. As well as making you unconscious for a certain amount of time, these medicines:  Prevent pain.  Control your blood pressure.  Relax your muscles. Tell a health care provider about:  Any allergies you have.  All medicines you are taking, including vitamins, herbs, eye drops, creams, and over-the-counter medicines.  Any problems you or family members have had with anesthetic medicines.  Types of anesthetics you have had in the past.  Any blood disorders you have.  Any surgeries you have had.  Any medical conditions you have.  Any recent upper respiratory, chest, or ear infections.  Any history of: ? Heart or lung conditions, such as heart failure, sleep apnea, asthma, or chronic obstructive pulmonary disease (COPD). ?  Financial planner. ? Depression or anxiety.  Any tobacco or drug use, including marijuana or alcohol use.  Whether you are pregnant or may be pregnant. What are the risks? Generally, this is a safe procedure. However, problems may occur, including:  Allergic reaction.  Lung and heart problems.  Inhaling food or liquid from the stomach into the lungs (aspiration).  Nerve injury.  Dental injury.  Air in the bloodstream, which can lead to stroke.  Extreme agitation or confusion (delirium) when you wake up from the anesthetic.  Waking up during your procedure and being unable to move. This is rare. These problems are more likely to develop if you are having a major surgery or if you have an advanced or serious medical  condition. You can prevent some of these complications by answering all of your health care provider's questions thoroughly and by following all instructions before your procedure. General anesthesia can cause side effects, including:  Nausea or vomiting.  A sore throat from the breathing tube.  Hoarseness.  Wheezing or coughing.  Shaking chills.  Tiredness.  Body aches.  Anxiety.  Sleepiness or drowsiness.  Confusion or agitation. What happens before the procedure? Staying hydrated Follow instructions from your health care provider about hydration, which may include:  Up to 2 hours before the procedure - you may continue to drink clear liquids, such as water, clear fruit juice, black coffee, and plain tea.   Eating and drinking restrictions Follow instructions from your health care provider about eating and drinking, which may include:  8 hours before the procedure - stop eating heavy meals or foods such as meat, fried foods, or fatty foods.  6 hours before the procedure - stop eating light meals or foods, such as toast or cereal.  6 hours before the procedure - stop drinking milk or drinks that contain milk.  2 hours before the procedure - stop drinking clear liquids. Medicines Ask your health care provider about:  Changing or stopping your regular medicines. This is especially important if you are taking diabetes medicines or blood thinners.  Taking medicines such as aspirin and ibuprofen. These medicines can thin your blood. Do not take these medicines unless your health care provider tells you to take them.  Taking over-the-counter medicines, vitamins, herbs, and supplements. Do not take these during the week before your procedure unless your health care provider approves them. General instructions  Starting 3-6 weeks before the procedure, do not use any products that contain nicotine or tobacco, such as cigarettes and e-cigarettes. If you need help quitting, ask  your health care provider.  If you brush your teeth on the morning of the procedure, make sure to spit out all of the toothpaste.  Tell your health care provider if you become ill or develop a cold, cough, or fever.  If instructed by your health care provider, bring your sleep apnea device with you on the day of your surgery (if applicable).  Ask your health care provider if you will be going home the same day, the following day, or after a longer hospital stay. ? Plan to have a responsible adult take you home from the hospital or clinic. ? Plan to have a responsible adult care for you for the time you are told after you leave the hospital or clinic. This is important. What happens during the procedure?  You will be given anesthetics through both of the following: ? A mask placed over your nose and mouth. ? An IV in one  of your veins.  You may receive a medicine to help you relax (sedative).  After you are unconscious, a breathing tube may be inserted down your throat to help you breathe. This will be removed before you wake up.  An anesthesia specialist will stay with you throughout your procedure. He or she will: ? Keep you comfortable and safe by continuing to give you medicines and adjusting the amount of medicine that you get. ? Monitor your blood pressure, pulse, and oxygen levels to make sure that the anesthetics do not cause any problems. The procedure may vary among health care providers and hospitals.   What happens after the procedure?  Your blood pressure, temperature, heart rate, breathing rate, and blood oxygen level will be monitored until the medicines you were given have worn off.  You will wake up in a recovery area. You may wake up slowly.  If you feel anxious or agitated, you may be given medicine to help you calm down.  If you will be going home the same day, your health care provider may check to make sure you can walk, drink, and urinate.  Your health care  provider will treat any pain or side effects you have before you go home.  Do not drive or operate machinery until your health care provider says that it is safe. Summary  General anesthesia is used to keep you still and prevent pain during a procedure.  It is important to tell your health care provider about your medical history and any surgeries you have had, and previous experience with anesthesia.  Follow your health care provider's instructions about when to stop eating, drinking, or taking certain medicines before your procedure.  Plan to have a responsible adult take you home from the hospital or clinic. This information is not intended to replace advice given to you by your health care provider. Make sure you discuss any questions you have with your health care provider. Document Revised: 06/14/2020 Document Reviewed: 01/14/2020 Elsevier Patient Education  2021 [employer].

## 2021-01-01 LAB — SARS CORONAVIRUS 2 (TAT 6-24 HRS): SARS Coronavirus 2: NEGATIVE

## 2021-01-03 ENCOUNTER — Inpatient Hospital Stay (HOSPITAL_COMMUNITY)
Admission: RE | Admit: 2021-01-03 | Discharge: 2021-01-05 | DRG: 327 | Disposition: A | Discharge status: Home / Self Care | Payer: Self-pay | Attending: General Surgery | Admitting: General Surgery

## 2021-01-03 ENCOUNTER — Ambulatory Visit: Payer: Self-pay | Admitting: Cardiology

## 2021-01-03 ENCOUNTER — Inpatient Hospital Stay (HOSPITAL_COMMUNITY): Payer: Self-pay | Admitting: Certified Registered Nurse Anesthetist

## 2021-01-03 ENCOUNTER — Encounter (HOSPITAL_COMMUNITY): Payer: Self-pay | Admitting: General Surgery

## 2021-01-03 ENCOUNTER — Other Ambulatory Visit: Payer: Self-pay

## 2021-01-03 ENCOUNTER — Encounter (HOSPITAL_COMMUNITY)
Admission: RE | Disposition: A | Discharge status: Home / Self Care | Payer: Self-pay | Source: Home / Self Care | Attending: General Surgery

## 2021-01-03 DIAGNOSIS — M549 Dorsalgia, unspecified: Secondary | ICD-10-CM | POA: Diagnosis present

## 2021-01-03 DIAGNOSIS — Z791 Long term (current) use of non-steroidal anti-inflammatories (NSAID): Secondary | ICD-10-CM

## 2021-01-03 DIAGNOSIS — Z800 Family history of malignant neoplasm of digestive organs: Secondary | ICD-10-CM

## 2021-01-03 DIAGNOSIS — E119 Type 2 diabetes mellitus without complications: Secondary | ICD-10-CM | POA: Diagnosis present

## 2021-01-03 DIAGNOSIS — Z87891 Personal history of nicotine dependence: Secondary | ICD-10-CM

## 2021-01-03 DIAGNOSIS — I10 Essential (primary) hypertension: Secondary | ICD-10-CM | POA: Diagnosis present

## 2021-01-03 DIAGNOSIS — Z980 Intestinal bypass and anastomosis status: Secondary | ICD-10-CM

## 2021-01-03 DIAGNOSIS — Z8249 Family history of ischemic heart disease and other diseases of the circulatory system: Secondary | ICD-10-CM

## 2021-01-03 DIAGNOSIS — Z888 Allergy status to other drugs, medicaments and biological substances status: Secondary | ICD-10-CM

## 2021-01-03 DIAGNOSIS — G4730 Sleep apnea, unspecified: Secondary | ICD-10-CM | POA: Diagnosis present

## 2021-01-03 DIAGNOSIS — Z7984 Long term (current) use of oral hypoglycemic drugs: Secondary | ICD-10-CM

## 2021-01-03 DIAGNOSIS — E785 Hyperlipidemia, unspecified: Secondary | ICD-10-CM | POA: Diagnosis present

## 2021-01-03 DIAGNOSIS — Z7982 Long term (current) use of aspirin: Secondary | ICD-10-CM

## 2021-01-03 DIAGNOSIS — K219 Gastro-esophageal reflux disease without esophagitis: Secondary | ICD-10-CM | POA: Diagnosis present

## 2021-01-03 DIAGNOSIS — G8929 Other chronic pain: Secondary | ICD-10-CM | POA: Diagnosis present

## 2021-01-03 DIAGNOSIS — K311 Adult hypertrophic pyloric stenosis: Principal | ICD-10-CM

## 2021-01-03 DIAGNOSIS — Z79899 Other long term (current) drug therapy: Secondary | ICD-10-CM

## 2021-01-03 DIAGNOSIS — K315 Obstruction of duodenum: Secondary | ICD-10-CM

## 2021-01-03 HISTORY — PX: GASTROJEJUNOSTOMY: SHX1697

## 2021-01-03 LAB — HEMOGLOBIN A1C
Hgb A1c MFr Bld: 7.2 % — ABNORMAL HIGH (ref 4.8–5.6)
Mean Plasma Glucose: 159.94 mg/dL

## 2021-01-03 LAB — GLUCOSE, CAPILLARY
Glucose-Capillary: 124 mg/dL — ABNORMAL HIGH (ref 70–99)
Glucose-Capillary: 123 mg/dL — ABNORMAL HIGH (ref 70–99)
Glucose-Capillary: 215 mg/dL — ABNORMAL HIGH (ref 70–99)
Glucose-Capillary: 191 mg/dL — ABNORMAL HIGH (ref 70–99)

## 2021-01-03 SURGERY — GASTROJEJUNOSTOMY
Anesthesia: General | Site: Abdomen

## 2021-01-03 MED ORDER — CHLORHEXIDINE GLUCONATE 0.12 % MT SOLN: 15.0000 mL | Freq: Once | OROMUCOSAL | Status: DC

## 2021-01-03 MED ORDER — DIPHENHYDRAMINE HCL 25 MG PO CAPS
25.0000 mg | ORAL_CAPSULE | Freq: Four times a day (QID) | ORAL | Status: DC | PRN
Start: 2021-01-03 — End: 2021-01-05
  Administered 2021-01-04 – 2021-01-05 (×2): 25 mg via ORAL
  Filled 2021-01-03 (×2): qty 1

## 2021-01-03 MED ORDER — ROCURONIUM BROMIDE 10 MG/ML (PF) SYRINGE
PREFILLED_SYRINGE | INTRAVENOUS | Status: AC
  Filled 2021-01-03: qty 10

## 2021-01-03 MED ORDER — ONDANSETRON 4 MG PO TBDP: 4.0000 mg | ORAL_TABLET | Freq: Four times a day (QID) | ORAL | Status: DC | PRN

## 2021-01-03 MED ORDER — ACETAMINOPHEN 325 MG PO TABS: 650.0000 mg | ORAL_TABLET | Freq: Four times a day (QID) | ORAL | Status: DC | PRN

## 2021-01-03 MED ORDER — ROPINIROLE HCL 0.25 MG PO TABS
0.5000 mg | ORAL_TABLET | Freq: Every day | ORAL | Status: DC
  Administered 2021-01-03 – 2021-01-05 (×2): 0.5 mg via ORAL
  Filled 2021-01-03 (×2): qty 2

## 2021-01-03 MED ORDER — PROPOFOL 10 MG/ML IV BOLUS
INTRAVENOUS | Status: AC
  Filled 2021-01-03: qty 20

## 2021-01-03 MED ORDER — GLYCOPYRROLATE PF 0.2 MG/ML IJ SOSY
PREFILLED_SYRINGE | INTRAMUSCULAR | Status: AC
  Filled 2021-01-03: qty 1

## 2021-01-03 MED ORDER — HYDROCHLOROTHIAZIDE 25 MG PO TABS
25.0000 mg | ORAL_TABLET | Freq: Every day | ORAL | Status: DC
  Administered 2021-01-03: 25 mg via ORAL
  Filled 2021-01-03 (×3): qty 1

## 2021-01-03 MED ORDER — PHENYLEPHRINE 40 MCG/ML (10ML) SYRINGE FOR IV PUSH (FOR BLOOD PRESSURE SUPPORT)
PREFILLED_SYRINGE | INTRAVENOUS | Status: AC
  Filled 2021-01-03: qty 10

## 2021-01-03 MED ORDER — PANTOPRAZOLE SODIUM 40 MG PO TBEC
40.0000 mg | DELAYED_RELEASE_TABLET | Freq: Every day | ORAL | Status: DC
  Administered 2021-01-04: 40 mg via ORAL
  Filled 2021-01-03: qty 1

## 2021-01-03 MED ORDER — SUGAMMADEX SODIUM 500 MG/5ML IV SOLN
INTRAVENOUS | Status: DC | PRN
  Administered 2021-01-03: 200 mg via INTRAVENOUS

## 2021-01-03 MED ORDER — DEXAMETHASONE SODIUM PHOSPHATE 10 MG/ML IJ SOLN
INTRAMUSCULAR | Status: DC | PRN
  Administered 2021-01-03: 5 mg via INTRAVENOUS

## 2021-01-03 MED ORDER — AMLODIPINE BESYLATE 5 MG PO TABS
10.0000 mg | ORAL_TABLET | Freq: Every evening | ORAL | Status: DC
  Administered 2021-01-04: 10 mg via ORAL
  Filled 2021-01-03: qty 2

## 2021-01-03 MED ORDER — POVIDONE-IODINE 10 % EX OINT
TOPICAL_OINTMENT | CUTANEOUS | Status: AC
  Filled 2021-01-03: qty 1

## 2021-01-03 MED ORDER — ONDANSETRON HCL 4 MG/2ML IJ SOLN
INTRAMUSCULAR | Status: AC
  Filled 2021-01-03: qty 2

## 2021-01-03 MED ORDER — ENOXAPARIN SODIUM 40 MG/0.4ML ~~LOC~~ SOLN
40.0000 mg | Freq: Once | SUBCUTANEOUS | Status: AC
  Administered 2021-01-03: 40 mg via SUBCUTANEOUS
  Filled 2021-01-03: qty 0.4

## 2021-01-03 MED ORDER — LIDOCAINE HCL (PF) 2 % IJ SOLN
INTRAMUSCULAR | Status: AC
  Filled 2021-01-03: qty 5

## 2021-01-03 MED ORDER — LACTATED RINGERS IV SOLN: INTRAVENOUS | Status: DC

## 2021-01-03 MED ORDER — INSULIN ASPART 100 UNIT/ML ~~LOC~~ SOLN
0.0000 [IU] | Freq: Three times a day (TID) | SUBCUTANEOUS | Status: DC
  Administered 2021-01-03: 5 [IU] via SUBCUTANEOUS
  Administered 2021-01-04: 2 [IU] via SUBCUTANEOUS
  Administered 2021-01-04: 3 [IU] via SUBCUTANEOUS
  Administered 2021-01-04: 2 [IU] via SUBCUTANEOUS

## 2021-01-03 MED ORDER — CHLORHEXIDINE GLUCONATE CLOTH 2 % EX PADS: 6.0000 | MEDICATED_PAD | Freq: Once | CUTANEOUS | Status: DC

## 2021-01-03 MED ORDER — KETOROLAC TROMETHAMINE 30 MG/ML IJ SOLN
30.0000 mg | Freq: Four times a day (QID) | INTRAMUSCULAR | Status: DC | PRN
Start: 2021-01-03 — End: 2021-01-05

## 2021-01-03 MED ORDER — SODIUM CHLORIDE 0.9 % IR SOLN
Status: DC | PRN
  Administered 2021-01-03: 2000 mL

## 2021-01-03 MED ORDER — EPHEDRINE 5 MG/ML INJ
INTRAVENOUS | Status: AC
  Filled 2021-01-03: qty 10

## 2021-01-03 MED ORDER — PROPOFOL 10 MG/ML IV BOLUS
INTRAVENOUS | Status: DC | PRN
  Administered 2021-01-03: 200 mg via INTRAVENOUS

## 2021-01-03 MED ORDER — ENOXAPARIN SODIUM 40 MG/0.4ML ~~LOC~~ SOLN
40.0000 mg | SUBCUTANEOUS | Status: DC
  Administered 2021-01-04: 40 mg via SUBCUTANEOUS
  Filled 2021-01-03 (×2): qty 0.4

## 2021-01-03 MED ORDER — LACTATED RINGERS IV SOLN
INTRAVENOUS | Status: DC
  Administered 2021-01-03: 1000 mL via INTRAVENOUS

## 2021-01-03 MED ORDER — FENTANYL CITRATE (PF) 100 MCG/2ML IJ SOLN
INTRAMUSCULAR | Status: DC | PRN
  Administered 2021-01-03: 100 ug via INTRAVENOUS
  Administered 2021-01-03 (×2): 50 ug via INTRAVENOUS

## 2021-01-03 MED ORDER — SODIUM CHLORIDE 0.9 % IV SOLN: INTRAVENOUS | Status: DC

## 2021-01-03 MED ORDER — KETOROLAC TROMETHAMINE 30 MG/ML IJ SOLN
30.0000 mg | Freq: Four times a day (QID) | INTRAMUSCULAR | Status: AC
  Administered 2021-01-03: 30 mg via INTRAVENOUS

## 2021-01-03 MED ORDER — SUCCINYLCHOLINE CHLORIDE 200 MG/10ML IV SOSY
PREFILLED_SYRINGE | INTRAVENOUS | Status: DC | PRN
  Administered 2021-01-03: 180 mg via INTRAVENOUS

## 2021-01-03 MED ORDER — SIMETHICONE 80 MG PO CHEW: 40.0000 mg | CHEWABLE_TABLET | Freq: Four times a day (QID) | ORAL | Status: DC | PRN

## 2021-01-03 MED ORDER — LIDOCAINE HCL (CARDIAC) PF 100 MG/5ML IV SOSY
PREFILLED_SYRINGE | INTRAVENOUS | Status: DC | PRN
  Administered 2021-01-03: 60 mg via INTRATRACHEAL

## 2021-01-03 MED ORDER — HYDROMORPHONE HCL 1 MG/ML IJ SOLN
1.0000 mg | INTRAMUSCULAR | Status: DC | PRN
  Administered 2021-01-03 – 2021-01-05 (×8): 1 mg via INTRAVENOUS
  Filled 2021-01-03 (×7): qty 1

## 2021-01-03 MED ORDER — MIDAZOLAM HCL 2 MG/2ML IJ SOLN
INTRAMUSCULAR | Status: AC
  Filled 2021-01-03: qty 2

## 2021-01-03 MED ORDER — ONDANSETRON HCL 4 MG/2ML IJ SOLN
INTRAMUSCULAR | Status: DC | PRN
  Administered 2021-01-03: 4 mg via INTRAVENOUS

## 2021-01-03 MED ORDER — LORAZEPAM 2 MG/ML IJ SOLN: 1.0000 mg | INTRAMUSCULAR | Status: DC | PRN

## 2021-01-03 MED ORDER — GLIPIZIDE 5 MG PO TABS
5.0000 mg | ORAL_TABLET | Freq: Every day | ORAL | Status: DC
  Administered 2021-01-04 – 2021-01-05 (×2): 5 mg via ORAL
  Filled 2021-01-03 (×2): qty 1

## 2021-01-03 MED ORDER — FENTANYL CITRATE (PF) 250 MCG/5ML IJ SOLN
INTRAMUSCULAR | Status: AC
  Filled 2021-01-03: qty 5

## 2021-01-03 MED ORDER — BUPIVACAINE LIPOSOME 1.3 % IJ SUSP
INTRAMUSCULAR | Status: AC
  Filled 2021-01-03: qty 20

## 2021-01-03 MED ORDER — SODIUM CHLORIDE 0.9 % IV SOLN
2.0000 g | INTRAVENOUS | Status: AC
  Administered 2021-01-03: 2 g via INTRAVENOUS
  Filled 2021-01-03: qty 20

## 2021-01-03 MED ORDER — HYDROMORPHONE HCL 1 MG/ML IJ SOLN
INTRAMUSCULAR | Status: AC
  Filled 2021-01-03: qty 2

## 2021-01-03 MED ORDER — LISINOPRIL 10 MG PO TABS
40.0000 mg | ORAL_TABLET | Freq: Every day | ORAL | Status: DC
  Administered 2021-01-04 – 2021-01-05 (×2): 40 mg via ORAL
  Filled 2021-01-03 (×2): qty 4

## 2021-01-03 MED ORDER — ONDANSETRON HCL 4 MG/2ML IJ SOLN: 4.0000 mg | Freq: Four times a day (QID) | INTRAMUSCULAR | Status: DC | PRN

## 2021-01-03 MED ORDER — ROCURONIUM BROMIDE 10 MG/ML (PF) SYRINGE
PREFILLED_SYRINGE | INTRAVENOUS | Status: AC
  Filled 2021-01-03: qty 20

## 2021-01-03 MED ORDER — METRONIDAZOLE IN NACL 5-0.79 MG/ML-% IV SOLN
500.0000 mg | INTRAVENOUS | Status: AC
  Administered 2021-01-03: 500 mg via INTRAVENOUS
  Filled 2021-01-03: qty 100

## 2021-01-03 MED ORDER — INSULIN ASPART 100 UNIT/ML ~~LOC~~ SOLN: 0.0000 [IU] | Freq: Every day | SUBCUTANEOUS | Status: DC

## 2021-01-03 MED ORDER — SUCCINYLCHOLINE CHLORIDE 200 MG/10ML IV SOSY
PREFILLED_SYRINGE | INTRAVENOUS | Status: AC
  Filled 2021-01-03: qty 10

## 2021-01-03 MED ORDER — EPHEDRINE SULFATE 50 MG/ML IJ SOLN
INTRAMUSCULAR | Status: DC | PRN
  Administered 2021-01-03 (×4): 10 mg via INTRAVENOUS

## 2021-01-03 MED ORDER — POVIDONE-IODINE 10 % OINT PACKET
TOPICAL_OINTMENT | CUTANEOUS | Status: DC | PRN
  Administered 2021-01-03: 1 via TOPICAL

## 2021-01-03 MED ORDER — ACETAMINOPHEN 650 MG RE SUPP: 650.0000 mg | Freq: Four times a day (QID) | RECTAL | Status: DC | PRN

## 2021-01-03 MED ORDER — MIDAZOLAM HCL 2 MG/2ML IJ SOLN
INTRAMUSCULAR | Status: DC | PRN
  Administered 2021-01-03: 2 mg via INTRAVENOUS

## 2021-01-03 MED ORDER — BUPIVACAINE LIPOSOME 1.3 % IJ SUSP
INTRAMUSCULAR | Status: DC | PRN
  Administered 2021-01-03: 20 mL

## 2021-01-03 MED ORDER — KETOROLAC TROMETHAMINE 30 MG/ML IJ SOLN
INTRAMUSCULAR | Status: AC
  Filled 2021-01-03: qty 1

## 2021-01-03 MED ORDER — ROCURONIUM BROMIDE 10 MG/ML (PF) SYRINGE
PREFILLED_SYRINGE | INTRAVENOUS | Status: DC | PRN
  Administered 2021-01-03: 20 mg via INTRAVENOUS
  Administered 2021-01-03: 40 mg via INTRAVENOUS

## 2021-01-03 MED ORDER — DIPHENHYDRAMINE HCL 50 MG/ML IJ SOLN
25.0000 mg | Freq: Four times a day (QID) | INTRAMUSCULAR | Status: DC | PRN
  Administered 2021-01-04: 25 mg via INTRAVENOUS
  Filled 2021-01-03 (×2): qty 1

## 2021-01-03 MED ORDER — OXYCODONE-ACETAMINOPHEN 5-325 MG PO TABS
1.0000 | ORAL_TABLET | ORAL | Status: DC | PRN
  Administered 2021-01-03 – 2021-01-04 (×3): 2 via ORAL
  Filled 2021-01-03 (×3): qty 2

## 2021-01-03 MED ORDER — DEXAMETHASONE SODIUM PHOSPHATE 10 MG/ML IJ SOLN
INTRAMUSCULAR | Status: AC
  Filled 2021-01-03: qty 2

## 2021-01-03 MED ORDER — ORAL CARE MOUTH RINSE: 15.0000 mL | Freq: Once | OROMUCOSAL | Status: DC

## 2021-01-03 SURGICAL SUPPLY — 31 items
APL PRP STRL LF DISP 70% ISPRP (MISCELLANEOUS) ×1
CHLORAPREP W/TINT 26 (MISCELLANEOUS) ×2 IMPLANT
CLOTH BEACON ORANGE TIMEOUT ST (SAFETY) ×2 IMPLANT
COVER LIGHT HANDLE STERIS (MISCELLANEOUS) ×4 IMPLANT
COVER WAND RF STERILE (DRAPES) ×2 IMPLANT
DRAPE WARM FLUID 44X44 (DRAPES) ×2 IMPLANT
DRSG OPSITE POSTOP 4X10 (GAUZE/BANDAGES/DRESSINGS) ×2 IMPLANT
ELECT REM PT RETURN 9FT ADLT (ELECTROSURGICAL) ×2
ELECTRODE REM PT RTRN 9FT ADLT (ELECTROSURGICAL) ×1 IMPLANT
GLOVE SURG SS PI 7.5 STRL IVOR (GLOVE) ×2 IMPLANT
GLOVE SURG UNDER POLY LF SZ7 (GLOVE) ×6 IMPLANT
GOWN STRL REUS W/TWL LRG LVL3 (GOWN DISPOSABLE) ×6 IMPLANT
INST SET MAJOR GENERAL (KITS) ×2 IMPLANT
KIT TURNOVER KIT A (KITS) ×2 IMPLANT
LIGASURE IMPACT 36 18CM CVD LR (INSTRUMENTS) ×2 IMPLANT
MANIFOLD NEPTUNE II (INSTRUMENTS) ×2 IMPLANT
NS IRRIG 1000ML POUR BTL (IV SOLUTION) ×4 IMPLANT
PACK MAJOR ABDOMINAL (CUSTOM PROCEDURE TRAY) ×2 IMPLANT
PAD ARMBOARD 7.5X6 YLW CONV (MISCELLANEOUS) ×2 IMPLANT
RELOAD PROXIMATE 75MM BLUE (ENDOMECHANICALS) ×6 IMPLANT
RELOAD STAPLE 75 3.8 BLU REG (ENDOMECHANICALS) ×3 IMPLANT
RETRACTOR WND ALEXIS-O 25 LRG (MISCELLANEOUS) IMPLANT
RTRCTR WOUND ALEXIS O 25CM LRG (MISCELLANEOUS) ×2
SET BASIN LINEN APH (SET/KITS/TRAYS/PACK) ×2 IMPLANT
SPONGE LAP 18X18 RF (DISPOSABLE) ×2 IMPLANT
STAPLER GUN LINEAR PROX 60 (STAPLE) ×3 IMPLANT
STAPLER PROXIMATE 75MM BLUE (STAPLE) ×2 IMPLANT
SUT NOVA NAB GS-26 0 60 (SUTURE) ×4 IMPLANT
SUT PDS AB 0 CTX 60 (SUTURE) ×4 IMPLANT
SUT SILK 3 0 SH CR/8 (SUTURE) ×6 IMPLANT
TRAY FOLEY SLVR 16FR LF STAT (SET/KITS/TRAYS/PACK) ×2 IMPLANT

## 2021-01-03 NOTE — Transfer of Care (Signed)
Immediate Anesthesia Transfer of Care Note  Patient: Jeremy [name]  Procedure(s) Performed: GASTROJEJUNOSTOMY (N/A Abdomen)  Patient Location: PACU  Anesthesia Type:General  Level of Consciousness: drowsy  Airway & Oxygen Therapy: Patient Spontanous Breathing and Patient connected to face mask oxygen  Post-op Assessment: Report given to RN and Post -op Vital signs reviewed and stable  Post vital signs: Reviewed and stable  Last Vitals:  Vitals Value Taken Time  BP 97/55 01/03/21 1133  Temp    Pulse 93 01/03/21 1134  Resp 19 01/03/21 1134  SpO2 99 % 01/03/21 1134  Vitals shown include unvalidated device data.  Last Pain:  Vitals:   01/03/21 0922  TempSrc: Oral  PainSc: 0-No pain      Patients Stated Pain Goal: 5 (01/03/21 6440)  Complications: No complications documented.

## 2021-01-03 NOTE — Interval H&P Note (Signed)
History and Physical Interval Note:  01/03/2021 9:26 AM  Jeremy [name]  has presented today for surgery, with the diagnosis of Gastric outlet obstruction.  The various methods of treatment have been discussed with the patient and family. After consideration of risks, benefits and other options for treatment, the patient has consented to  Procedure(s): GASTROJEJUNOSTOMY (N/A) as a surgical intervention.  The patient's history has been reviewed, patient examined, no change in status, stable for surgery.  I have reviewed the patient's chart and labs.  Questions were answered to the patient's satisfaction.     [name]

## 2021-01-03 NOTE — Anesthesia Procedure Notes (Signed)
Procedure Name: Intubation Date/Time: 01/03/2021 10:04 AM Performed by: Lorin Glass, CRNA Pre-anesthesia Checklist: Patient identified, Emergency Drugs available, Suction available and Patient being monitored Patient Re-evaluated:Patient Re-evaluated prior to induction Oxygen Delivery Method: Circle system utilized Preoxygenation: Pre-oxygenation with 100% oxygen Induction Type: IV induction Ventilation: Mask ventilation without difficulty Laryngoscope Size: Glidescope and 3 Grade View: Grade I Tube type: Oral Tube size: 7.5 mm Number of attempts: 1 Airway Equipment and Method: Stylet Placement Confirmation: ETT inserted through vocal cords under direct vision,  positive ETCO2 and breath sounds checked- equal and bilateral Secured at: 21 cm Tube secured with: Tape Dental Injury: Teeth and Oropharynx as per pre-operative assessment

## 2021-01-03 NOTE — Anesthesia Postprocedure Evaluation (Signed)
Anesthesia Post Note  Patient: Jeremy [name]  Procedure(s) Performed: GASTROJEJUNOSTOMY (N/A Abdomen)  Patient location during evaluation: PACU Anesthesia Type: General Level of consciousness: awake Pain management: pain level controlled Vital Signs Assessment: post-procedure vital signs reviewed and stable Respiratory status: spontaneous breathing and respiratory function stable Cardiovascular status: blood pressure returned to baseline and stable Postop Assessment: no headache and no apparent nausea or vomiting Anesthetic complications: no   No complications documented.   Last Vitals:  Vitals:   01/03/21 1300 01/03/21 1301  BP: 122/60   Pulse: 76   Resp: 18   Temp:    SpO2: 100% 96%    Last Pain:  Vitals:   01/03/21 1300  TempSrc:   PainSc: 5                  Windell Norfolk

## 2021-01-03 NOTE — Op Note (Signed)
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  They have been dilated in the past, but at the most recent EGD, the duodenal stricture cannot be dilated.  The patient was noted to have retained food particles.  Now presents for a gastrojejunostomy.  The risks and benefits of the procedure including bleeding, infection, anastomotic leak, anastomotic stricture, and the possibility of needing revision were fully explained to the patient, who gave informed consent.  Procedure note: The patient was placed in the supine position.  After induction of general endotracheal anesthesia, the abdomen was prepped and draped using the usual sterile technique with ChloraPrep.  Surgical site confirmation was performed.  An upper midline incision was made.  Peritoneal cavity was entered into without difficulty.  On exploration, the stomach was noted to be somewhat dilated with hypertrophy of the gastric wall.  No palpable masses were noted in the pylorus or duodenal regions.  The lesser sac was entered into using the LigaSure.  The small bowel was then inspected from the ligament of Treitz to the terminal ileum.  The small bowel was noted to be Onda somewhat shortened mesentery.  This was in addition to the patient having a significant amount of greater omentum extending from the colon.  It was elected to proceed with a retrocolic approach to the posterior stomach.  We measured approximately 50 cm from the ligament of Treitz.  The small bowel was then divided using a GIA-75 stapler.  The Roux limb was then brought up retrocolic  to the posterior distal stomach.  A side enteroenterostomy gastrojejunostomy was performed using a GIA-75 stapler.  The enterotomy was closed using a TA 70 stapler.  The staple line was bolstered using 3-0 silk sutures.  An approximate 5 to 6 cm anastomotic opening was noted.  We then performed a side to side enteroenterostomy using a GIA 75 stapler.  The anastomosis was done at a point around the 100 cm past the ligament of Treitz.  The enterotomy was closed using a TA 60 stapler.  3-0 silk Lembert sutures were used to secure the crossing staple lines.  The bowel was then returned into the abdominal cavity in an orderly fashion.  Minimal leakage from the stomach and small bowel was noted and immediately removed from the operative field.  The fascia was reapproximated using a looped 0 PDS running suture.  The subcutaneous layer was irrigated with normal saline.  Exparel was instilled into the surrounding wound.  The skin was closed using staples.  Betadine ointment and a dry sterile dressing were applied.  All tape and needle counts were correct at the end of the procedure.  The patient was extubated in the operating room and transferred to PACU in stable condition.  Complications: None  EBL: Minimal  Specimen: None

## 2021-01-03 NOTE — Anesthesia Preprocedure Evaluation (Signed)
Anesthesia Evaluation  Patient identified by MRN, date of birth, ID band Patient awake    Reviewed: Allergy & Precautions, H&P , NPO status , Patient's Chart, lab work & pertinent test results, reviewed documented beta blocker date and time   Airway Mallampati: II  TM Distance: >3 FB Neck ROM: full    Dental no notable dental hx. (+) Missing, Chipped, Dental Advisory Given, Loose, Poor Dentition,    Pulmonary sleep apnea , former smoker,    Pulmonary exam normal breath sounds clear to auscultation       Cardiovascular Exercise Tolerance: Good hypertension, + CAD   Rhythm:regular Rate:Normal     Neuro/Psych  Neuromuscular disease negative psych ROS   GI/Hepatic Neg liver ROS, GERD  Medicated,  Endo/Other  negative endocrine ROSdiabetes  Renal/GU negative Renal ROS  negative genitourinary   Musculoskeletal   Abdominal   Peds  Hematology  (+) Blood dyscrasia, anemia ,   Anesthesia Other Findings   Reproductive/Obstetrics negative OB ROS                             Anesthesia Physical  Anesthesia Plan  ASA: II  Anesthesia Plan: General   Post-op Pain Management:    Induction:   PONV Risk Score and Plan: Propofol infusion  Airway Management Planned: Nasal Cannula  Additional Equipment:   Intra-op Plan:   Post-operative Plan:   Informed Consent: I have reviewed the patients History and Physical, chart, labs and discussed the procedure including the risks, benefits and alternatives for the proposed anesthesia with the patient or authorized representative who has indicated his/her understanding and acceptance.     Dental Advisory Given  Plan Discussed with: CRNA  Anesthesia Plan Comments:         Anesthesia Quick Evaluation

## 2021-01-04 ENCOUNTER — Encounter (HOSPITAL_COMMUNITY): Payer: Self-pay | Admitting: General Surgery

## 2021-01-04 LAB — GLUCOSE, CAPILLARY
Glucose-Capillary: 184 mg/dL — ABNORMAL HIGH (ref 70–99)
Glucose-Capillary: 141 mg/dL — ABNORMAL HIGH (ref 70–99)
Glucose-Capillary: 164 mg/dL — ABNORMAL HIGH (ref 70–99)
Glucose-Capillary: 135 mg/dL — ABNORMAL HIGH (ref 70–99)
Glucose-Capillary: 139 mg/dL — ABNORMAL HIGH (ref 70–99)

## 2021-01-04 LAB — BASIC METABOLIC PANEL
Anion gap: 11 (ref 5–15)
BUN: 15 mg/dL (ref 6–20)
CO2: 22 mmol/L (ref 22–32)
Calcium: 8.6 mg/dL — ABNORMAL LOW (ref 8.9–10.3)
Chloride: 101 mmol/L (ref 98–111)
Creatinine, Ser: 0.78 mg/dL (ref 0.61–1.24)
GFR, Estimated: >60 mL/min (ref >60)
Glucose, Bld: 166 mg/dL — ABNORMAL HIGH (ref 70–99)
Potassium: 3.9 mmol/L (ref 3.5–5.1)
Sodium: 134 mmol/L — ABNORMAL LOW (ref 135–145)

## 2021-01-04 LAB — PHOSPHORUS: Phosphorus: 3.2 mg/dL (ref 2.5–4.6)

## 2021-01-04 LAB — CBC
HCT: 33.3 % — ABNORMAL LOW (ref 39.0–52.0)
Hemoglobin: 10.8 g/dL — ABNORMAL LOW (ref 13.0–17.0)
MCH: 29.2 pg (ref 26.0–34.0)
MCHC: 32.4 g/dL (ref 30.0–36.0)
MCV: 90 fL (ref 80.0–100.0)
Platelets: 192 10*3/uL (ref 150–400)
RBC: 3.7 MIL/uL — ABNORMAL LOW (ref 4.22–5.81)
RDW: 13.5 % (ref 11.5–15.5)
WBC: 9.4 10*3/uL (ref 4.0–10.5)
nRBC: 0 % (ref 0.0–0.2)

## 2021-01-04 LAB — MAGNESIUM: Magnesium: 1.6 mg/dL — ABNORMAL LOW (ref 1.7–2.4)

## 2021-01-04 MED ORDER — LORATADINE 10 MG PO TABS
10.0000 mg | ORAL_TABLET | Freq: Every day | ORAL | Status: DC
  Administered 2021-01-04 – 2021-01-05 (×2): 10 mg via ORAL
  Filled 2021-01-04 (×2): qty 1

## 2021-01-04 MED ORDER — DOCUSATE SODIUM 100 MG PO CAPS
100.0000 mg | ORAL_CAPSULE | Freq: Two times a day (BID) | ORAL | Status: DC
  Administered 2021-01-05 (×2): 100 mg via ORAL
  Filled 2021-01-04 (×2): qty 1

## 2021-01-04 MED ORDER — MAGNESIUM SULFATE 2 GM/50ML IV SOLN: 2.0000 g | Freq: Once | INTRAVENOUS | Status: DC

## 2021-01-04 NOTE — Progress Notes (Signed)
1 Day Post-Op  Subjective: Patient has moderate incisional pain that is somewhat controlled with pain medication.  No nausea or vomiting have been noted.  Objective: Vital signs in last 24 hours: Temp:  [97.4 F (36.3 C)-98 F (36.7 C)] 97.4 F (36.3 C) (03/22 0449) Pulse Rate:  [72-91] 72 (03/22 0449) Resp:  [13-20] 16 (03/22 0449) BP: (97-144)/(41-63) 100/41 (03/22 0449) SpO2:  [91 %-100 %] 93 % (03/22 0449) Last BM Date: 12/29/20  Intake/Output from previous day: 03/21 0701 - 03/22 0700 In: 3175 [P.O.:460; I.V.:2515; IV Piggyback:200] Out: 200 [Urine:150; Blood:50] Intake/Output this shift: No intake/output data recorded.  General appearance: alert, cooperative and no distress Resp: clear to auscultation bilaterally Cardio: regular rate and rhythm, S1, S2 normal, no murmur, click, rub or gallop GI: Soft, minimal bowel sounds appreciated.  Incision healing well.  Nondistended.  Lab Results:  Recent Labs    01/04/21 0456  WBC 9.4  HGB 10.8*  HCT 33.3*  PLT 192   BMET Recent Labs    01/04/21 0456  NA 134*  K 3.9  CL 101  CO2 22  GLUCOSE 166*  BUN 15  CREATININE 0.78  CALCIUM 8.6*   PT/INR No results for input(s): LABPROT, INR in the last 72 hours.  Studies/Results: No results found.  Anti-infectives: Anti-infectives (From admission, onward)   Start     Dose/Rate Route Frequency Ordered Stop   01/03/21 0900  cefTRIAXone (ROCEPHIN) 2 g in sodium chloride 0.9 % 100 mL IVPB       "And" Linked Group Details   2 g 200 mL/hr over 30 Minutes Intravenous On call to O.R. 01/03/21 0859 01/03/21 1025   01/03/21 0900  metroNIDAZOLE (FLAGYL) IVPB 500 mg       "And" Linked Group Details   500 mg 100 mL/hr over 60 Minutes Intravenous On call to O.R. 01/03/21 0859 01/03/21 1109      Assessment/Plan: s/p Procedure(s): GASTROJEJUNOSTOMY Impression: Stable on postoperative day 1.  Mild hypomagnesemia will be treated.  Tolerating clear liquid diet well. Plan: Will  advance to full liquid diet.  Will get patient out of bed.  LOS: 1 day    [name] 01/04/2021

## 2021-01-05 LAB — BASIC METABOLIC PANEL
Anion gap: 9 (ref 5–15)
BUN: 15 mg/dL (ref 6–20)
CO2: 23 mmol/L (ref 22–32)
Calcium: 8.6 mg/dL — ABNORMAL LOW (ref 8.9–10.3)
Chloride: 103 mmol/L (ref 98–111)
Creatinine, Ser: 0.72 mg/dL (ref 0.61–1.24)
GFR, Estimated: >60 mL/min (ref >60)
Glucose, Bld: 107 mg/dL — ABNORMAL HIGH (ref 70–99)
Potassium: 3.6 mmol/L (ref 3.5–5.1)
Sodium: 135 mmol/L (ref 135–145)

## 2021-01-05 LAB — CBC
HCT: 32.9 % — ABNORMAL LOW (ref 39.0–52.0)
Hemoglobin: 10.8 g/dL — ABNORMAL LOW (ref 13.0–17.0)
MCH: 29.6 pg (ref 26.0–34.0)
MCHC: 32.8 g/dL (ref 30.0–36.0)
MCV: 90.1 fL (ref 80.0–100.0)
Platelets: 189 10*3/uL (ref 150–400)
RBC: 3.65 MIL/uL — ABNORMAL LOW (ref 4.22–5.81)
RDW: 13.7 % (ref 11.5–15.5)
WBC: 7.7 10*3/uL (ref 4.0–10.5)
nRBC: 0 % (ref 0.0–0.2)

## 2021-01-05 LAB — PHOSPHORUS: Phosphorus: 2.2 mg/dL — ABNORMAL LOW (ref 2.5–4.6)

## 2021-01-05 LAB — MAGNESIUM: Magnesium: 1.9 mg/dL (ref 1.7–2.4)

## 2021-01-05 LAB — GLUCOSE, CAPILLARY: Glucose-Capillary: 100 mg/dL — ABNORMAL HIGH (ref 70–99)

## 2021-01-05 MED ORDER — OXYCODONE-ACETAMINOPHEN 5-325 MG PO TABS: 1.0000 | ORAL_TABLET | ORAL | 0 refills | Status: DC | PRN

## 2021-01-05 NOTE — Progress Notes (Addendum)
Patient ambulating in hallway in no distress.  Patient ambulated greater than 100 feet. Patient has tolerated liquids well.

## 2021-01-05 NOTE — Discharge Summary (Signed)
Physician Discharge Summary  Patient ID: Jeremy [name] MRN: [id number] DOB/AGE: [birth year] 58 y.o.  Admit date: 01/03/2021 Discharge date: 01/05/2021  Admission Diagnoses: Gastric outlet obstruction  Discharge Diagnoses: Same Active Problems:   Gastric outlet obstruction   Duodenal stricture   S/P bypass gastrojejunostomy   Discharged Condition: good  Hospital Course: Patient is a 60 year old white male with a history of pyloric and duodenal benign stricture who underwent a Roux-en-Y gastrojejunostomy on 01/03/2021.  He tolerated the surgery well.  His postoperative course was for the most part unremarkable.  He did have mild hypomagnesemia but he refused treatment.  His diet has been advanced to a full liquid diet.  He is tolerating this well.  He denies any nausea or vomiting.  His incisional pain is controlled with Percocet.  He is being discharged home on 01/05/2021 in good and improving condition.  He was instructed to advance his diet as tolerated.  His blood pressure has been good.  This is being controlled by cardiology, who is seeing the patient in 5 days.     Treatments: surgery: Roux-en-Y gastrojejunostomy on 01/03/2021  Discharge Exam: Blood pressure (!) 107/52, pulse 70, temperature 98.3 F (36.8 C), temperature source Oral, resp. rate 19, SpO2 92 %. General appearance: alert, cooperative and no distress Resp: clear to auscultation bilaterally Cardio: regular rate and rhythm, S1, S2 normal, no murmur, click, rub or gallop GI: Soft, incision healing well.  Bowel sounds present.  Disposition: Discharge disposition: 01-Home or Self Care       Discharge Instructions    Diet - low sodium heart healthy   Complete by: As directed    Increase activity slowly   Complete by: As directed      Allergies as of 01/05/2021      Reactions   Cymbalta [duloxetine Hcl] Other (See Comments)   Caused anorgasmia   Other Nausea And Vomiting   Mayonnaise mustard ketchup   Robaxin  [methocarbamol] Other (See Comments)   Had hiccups x 4 hours after taking      Medication List    STOP taking these medications   traMADol 50 MG tablet Commonly known as: ULTRAM     TAKE these medications   acetaminophen 500 MG tablet Commonly known as: TYLENOL Take 1,000 mg by mouth in the morning and at bedtime.   amLODipine 10 MG tablet Commonly known as: NORVASC Take 1 tablet (10 mg total) by mouth daily. What changed: when to take this   aspirin 81 MG chewable tablet Chew 81 mg by mouth every evening.   atorvastatin 80 MG tablet Commonly known as: LIPITOR Take 1 tablet (80 mg total) by mouth daily. What changed: when to take this   glipiZIDE 5 MG tablet Commonly known as: GLUCOTROL TAKE 1 TABLET BY MOUTH IN THE MORNINGS AND 1 TABLET IN THE EVENINGS.   glucose blood test strip Use as instructed   hydrochlorothiazide 25 MG tablet Commonly known as: HYDRODIURIL Take 1 tablet (25 mg total) by mouth daily.   lisinopril 40 MG tablet Commonly known as: ZESTRIL Take 1 tablet (40 mg total) by mouth daily. What changed:   how much to take  when to take this   loratadine 10 MG tablet Commonly known as: CLARITIN Take 10 mg by mouth daily.   magnesium oxide 400 MG tablet Commonly known as: MAG-OX Take 400 mg by mouth daily.   naproxen sodium 220 MG tablet Commonly known as: ALEVE Take 220 mg by mouth in the morning and  at bedtime.   oxyCODONE-acetaminophen 5-325 MG tablet Commonly known as: Percocet Take 1 tablet by mouth every 4 (four) hours as needed for severe pain.   pantoprazole 40 MG tablet Commonly known as: PROTONIX TAKE 1 TABLET (40 MG TOTAL) BY MOUTH 2 (TWO) TIMES DAILY BEFORE A MEAL. What changed: when to take this   Potassium 99 MG Tabs Take 99 mg by mouth every evening.   rOPINIRole 0.5 MG tablet Commonly known as: REQUIP Take 1 tablet (0.5 mg total) by mouth at bedtime.   sildenafil 50 MG tablet Commonly known as: Viagra 1 tab 1/2  hr prior to sex.  Limit use to 1 tab/24 hrs   True Metrix Meter w/Device Kit 1 each by Does not apply route 4 (four) times daily -  before meals and at bedtime.   TRUEplus Lancets 28G Misc CHECK BLOOD SUGAR FASTING AND BEFORE MEALS AND AGAIN IF PT FEELS BAD (SYMPTOMS OF HYPO).       Follow-up Information    , , MD. Schedule an appointment as soon as possible for a visit on 01/11/2021.   Specialty: General Surgery Contact information: [address] [name] [address] [phone]               Signed:   01/05/2021, 8:10 AM  

## 2021-01-05 NOTE — Discharge Instructions (Signed)
May shower and keep wound clean and dry with soap and water.  No heavy lifting over 20pounds

## 2021-01-06 ENCOUNTER — Telehealth: Payer: Self-pay

## 2021-01-06 MED FILL — LISINOPRIL 40 MG TAB: 40 | 30 days supply | Qty: 30 | Fill #3

## 2021-01-06 MED FILL — AMLODIPINE BESYLATE 10 MG T: 10 | 30 days supply | Qty: 30 | Fill #2

## 2021-01-06 NOTE — Telephone Encounter (Signed)
Transition Care Management Unsuccessful Follow-up Telephone Call  Date of discharge and from where:  [name] Attempts:  1st Attempt  Reason for unsuccessful TCM follow-up call:  Left voice messages on # [phone] and # [phone].  Call back requested to this CM.  Patient has follow up with Dr Lovell Sheehan 01/11/2021. Need to inquire if he would like to schedule a follow up with PCP.

## 2021-01-06 NOTE — Telephone Encounter (Signed)
Pt called back and reported that he does not need to follow up with PCP

## 2021-01-07 ENCOUNTER — Telehealth: Payer: Self-pay

## 2021-01-07 NOTE — Telephone Encounter (Signed)
Transition Care Management Unsuccessful Follow-up Telephone Call  Date of discharge and from where:  [facility]/05/2021, [name] Attempts:  2 st Attempt  Reason for unsuccessful TCM follow-up call:  Left voice messages on # [phone] and # [phone].  Call back requested to this CM.  Patient has follow up with Dr Lovell Sheehan 01/11/2021. Need to inquire if he would like to schedule a follow up with PCP.

## 2021-01-10 ENCOUNTER — Telehealth: Payer: Self-pay

## 2021-01-10 NOTE — Telephone Encounter (Signed)
Transition Care Management Follow-up Telephone Call  Date of discharge and from where: 01/05/2021, [facility]   How have you been since you were released from the hospital? He said he is feeling ill, he's angry and his stomach hurts. He said he would discuss this with his surgeon this week. He did not have any information to share with Dr Laural Benes.  Any questions or concerns? Yes, noted above   Items Reviewed:  Did the pt receive and understand the discharge instructions provided? Yes   Medications obtained and verified? Yes  - he said that he had all of the medications.  Other? No   Any new allergies since your discharge? No   Do you have support at home? Yes   Home Care and Equipment/Supplies: Were home health services ordered? no If so, what is the name of the agency? n/a  Has the agency set up a time to come to the patient's home? not applicable Were any new equipment or medical supplies ordered?  No What is the name of the medical supply agency? n/a Were you able to get the supplies/equipment? not applicable Do you have any questions related to the use of the equipment or supplies? No   He has as glucometer  Functional Questionnaire: (I = Independent and D = Dependent) ADLs:independent  Follow up appointments reviewed:   PCP Hospital f/u appt confirmed? No  -he did not want to schedule an appointment at this time.  He has the phone number for this clinic to call to schedule when he is ready   Specialist Hospital f/u appt confirmed? Yes surgeon - 01/11/2021.   Are transportation arrangements needed? not addressed  If their condition worsens, is the pt aware to call PCP or go to the Emergency Dept.? Yes. He can also call his surgeon with questions related to his surgery.   Was the patient provided with contact information for the PCP's office or ED? Lorella Nimrod has the clinic phone number.  Was to pt encouraged to call back with questions or concerns? Yes

## 2021-01-10 NOTE — Progress Notes (Deleted)
Cardiology Office Note:    Date:  01/10/2021   ID:  Jeremy [name], DOB 03/05/1961, MRN [id number]  PCP:  Jeremy Matar, MD   Jeremy [name]  Cardiologist:  Jeremy Sprague, MD  Advanced Practice Provider:  No care team member to display Electrophysiologist:  None   Referring MD: Jeremy Matar, MD    History of Present Illness:    Jeremy [name] is a 60 y.o. male with a hx of HTN, HLD, DMII, OSA, aortic atherosclerosiswho presents to clinic for follow-up.   Patient initially seen on 09/20/20 for multivessel, extensive coronary calcification noted on CT abdomen/pelvis. He was also complaining of exertional chest discomfort and SOB at that time that was relieved with rest. Symptoms were particularly worse when walking an incline. During that visit, we recommended coronary angiography but he had to re-schedule due to feeling sick with congestion, fevers and cough.   He then returned to clinic on 11/12/20 where he continued to have chest discomfort, but instead of occurring with exertion, it was occurring all the time. Stated it felt like reflux and improved somewhat with protonix, however, he can only take protonix 2x/week because it makes him feel bloated.. Notably he had been seen by GI in the past where he was diagnosed with "narrowings" in his duodenum one of which has been dilated but he has another one which was unable to be intervened up. He was wondering if this may be contributing to his symptoms.   Given the change in his symptoms, we decided to pursue a lexiscan on 11/23/20 which was negative for ischemia with normal LVEF. TTE 11/11/20 with LVEF 60-65%, no significant valve disease.  He subsequently was hospitalized at [facility] from 01/03/21-01/05/21 for gastric outlet obstruction with duodenal obstruction now s/p Roux-en-Y gastrojejunostomy on 01/03/2021.  Past Medical History:  Diagnosis Date  . Diabetes mellitus without complication (HCC)   . GERD  (gastroesophageal reflux disease)   . Hyperlipidemia   . Hypertension   . IDA (iron deficiency anemia)   . Rash of entire body 03/2016  . Sleep apnea     Past Surgical History:  Procedure Laterality Date  . BACK SURGERY  1993   lumbar  . BIOPSY  02/07/2019   Procedure: BIOPSY;  Surgeon: Jeremy Bali, MD;  [facility];  Service: Endoscopy;;  . CERVICAL SPINE SURGERY  2000  . COLONOSCOPY N/A 02/07/2019   Dr. Darrick [name]: External hemorrhoids next colonoscopy in 10 years  . ESOPHAGOGASTRODUODENOSCOPY N/A 02/07/2019   Dr. Darrick [name]: Barrett's esophagus without dysplasia chronic inactive gastritis but no H. 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 ESOPHAGOGASTRODUODENOSCOPY N/A 01/05/2020   Procedure: ESOPHAGOGASTRODUODENOSCOPY (EGD);  Surgeon: Jeremy Bali, MD;  [facility];  Service: Endoscopy;  Laterality: N/A;  10:30am  . ESOPHAGOGASTRODUODENOSCOPY (EGD) WITH PROPOFOL N/A 12/06/2020   Procedure: ESOPHAGOGASTRODUODENOSCOPY (EGD) WITH PROPOFOL;  Surgeon: Jeremy Bal, DO;  [facility];  Service: Endoscopy;  Laterality: N/A;  11:45am  . GASTROJEJUNOSTOMY N/A 01/03/2021   Procedure: GASTROJEJUNOSTOMY;  Surgeon: Jeremy Macho, MD;  Location: [facility];  Service: General;  Laterality: N/A;  . HAND SURGERY    . SAVORY DILATION N/A 01/05/2020   Procedure: SAVORY DILATION;  Surgeon: Jeremy Bali, MD;  [facility];  Service: Endoscopy;  Laterality: N/A;    Current Medications: No outpatient medications have been marked as taking for  the 01/12/21 encounter (Appointment) with Jeremy Sprague, MD.     Allergies:   Cymbalta [duloxetine hcl], Other, and Robaxin [methocarbamol]   Social History   Socioeconomic History  . Marital status: Significant Other    Spouse name: Not on file  . Number of children: Not on file  . Years of education: Not on file   . Highest education level: Not on file  Occupational History  . Occupation: Product/process development scientist  Tobacco Use  . Smoking status: Former Smoker    Quit date: 10/16/1988    Years since quitting: 32.2  . Smokeless tobacco: Never Used  Vaping Use  . Vaping Use: Never used  Substance and Sexual Activity  . Alcohol use: No  . Drug use: No  . Sexual activity: Not on file  Other Topics Concern  . Not on file  Social History Narrative   Volunteers as IT sales professional   Social Determinants of Corporate investment banker Strain: Not on file  Food Insecurity: Not on file  Transportation Needs: Not on file  Physical Activity: Not on file  Stress: Not on file  Social Connections: Not on file     Family History: The patient's ***family history includes CAD in his father; Colon cancer in his paternal grandfather; Pancreatic cancer in his mother.  ROS:   Please see the history of present illness.    *** All other systems reviewed and are negative.  EKGs/Labs/Other Studies Reviewed:    The following studies were reviewed today: Myoview 11/23/20:  lear stress EF: 61%.  There was no ST segment deviation noted during stress.  No T wave inversion was noted during stress.  The study is normal.  This is a low risk study.  The left ventricular ejection fraction is normal (55-65%).  TTE 11/11/20: 1. Left ventricular ejection fraction, by estimation, is 60 to 65%. The  left ventricle has normal function. The left ventricle has no regional  wall motion abnormalities. There is moderate left ventricular hypertrophy.  Left ventricular diastolic  parameters were normal.  2. Right ventricular systolic function is normal. The right ventricular  size is normal. Tricuspid regurgitation signal is inadequate for assessing  PA pressure.  3. The mitral valve is normal in structure. Trivial mitral valve  regurgitation. No evidence of mitral stenosis.  4. The aortic valve is tricuspid. Aortic  valve regurgitation is trivial.  No aortic stenosis is present.  5. The inferior vena cava is normal in size with greater than 50%  respiratory variability, suggesting right atrial pressure of 3 mmHg.  CT abdomen/pelvis: FINDINGS: Lower chest: The visualized lung bases are clear bilaterally. Extensive multi-vessel coronary artery calcification. Global cardiac size is within normal limits.  Hepatobiliary: No focal liver abnormality is seen. No gallstones, gallbladder wall thickening, or biliary dilatation.  Pancreas: Unremarkable  Spleen: Unremarkable  Adrenals/Urinary Tract: The adrenal glands are unremarkable. Punctate 1-2 mm nonobstructing calculi are seen within the lower pole of the kidneys bilaterally. Simple cortical cyst noted within the lower pole of the right kidney. The kidneys are otherwise unremarkable. The bladder is unremarkable.  Stomach/Bowel: There is periluminal inflammatory stranding and hyperemia involving the terminal gastric antrum, pylorus, and duodenal bulb in keeping with an infectious or inflammatory antritis/duodenitis, as can be seen with caustic ingestion, drug toxicity, Helicobacter pylori infection, and hyper secretory conditions. No evidence of perforation. No obstruction. The small and large bowel are unremarkable. Appendix normal. No free intraperitoneal gas or fluid.  Vascular/Lymphatic: Mild aortoiliac atherosclerotic calcification without evidence  of aneurysm. No pathologic adenopathy within the abdomen and pelvis.  Reproductive: Prostate is unremarkable.  Other: Rectum unremarkable  Musculoskeletal: Degenerative changes seen within the lumbar spine with calcified broad-based posterior disc bulges throughout the lumbar spine resulting in multilevel central canal stenosis. No acute bone abnormality.  IMPRESSION: Inflammatory changes in keeping with infectious or inflammatory gastritis/duodenitis. No evidence of obstruction  or perforation. Correlation with endoscopy would be helpful for further evaluation.  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 This was better assessed on prior MRI examination of 01/13/2017.  EKG:  EKG is *** ordered today.  The ekg ordered today demonstrates ***  Recent Labs: 07/22/2020: B Natriuretic Peptide 73.5 12/31/2020: ALT 22 01/04/2021: Hemoglobin 10.8; Platelets 189 01/05/2021: BUN 15; Creatinine, Ser 0.72; Magnesium 1.9; Potassium 3.6; Sodium 135  Recent Lipid Panel    Component Value Date/Time   CHOL 150 02/05/2020 1442   TRIG 168 (H) 02/05/2020 1442   HDL 40 02/05/2020 1442   CHOLHDL 3.8 02/05/2020 1442   CHOLHDL 4.8 10/17/2016 1012   VLDL NOT CALC 10/17/2016 1012   LDLCALC 81 02/05/2020 1442     Risk Assessment/Calculations:   {Does this patient have ATRIAL FIBRILLATION?:[phone]}   Physical Exam:    VS:  There were no vitals taken for this visit.    Wt Readings from Last 3 Encounters:  12/31/20 216 lb (98 kg)  12/21/20 226 lb (102.5 kg)  12/16/20 222 lb (100.7 kg)     GEN: *** Well nourished, well developed in no acute distress HEENT: Normal NECK: No JVD; No carotid bruits LYMPHATICS: No lymphadenopathy CARDIAC: ***RRR, no murmurs, rubs, gallops RESPIRATORY:  Clear to auscultation without rales, wheezing or rhonchi  ABDOMEN: Soft, non-tender, non-distended MUSCULOSKELETAL:  No edema; No deformity  SKIN: Warm and dry NEUROLOGIC:  Alert and oriented x 3 PSYCHIATRIC:  Normal affect   ASSESSMENT:    No diagnosis found. 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 Suspect symptoms of chest discomfort were related to gastric outlet obstruction and duodenal stricture now s/p Roux-en-Y gastrojejunostomy on 01/03/2021. Doing well post-operatively -Myoview negative for ischemia -TTE with normal BiV function, no  significant valvular abnormalities -Continue ASA 81mg  daily -Continue lipitor 80mg  daily -Stopped coreg as patient having issues with intimacy on the medication -Continue amlodipine 10mg  daily -Continue lisinopril 40mg daily  #Duodenal strictures #Gastric outlet obstruction: Roux-en-Y gastrojejunostomy on 01/03/2021. Doing well. -Follow-up with GI and surgery as scheduled  #HTN -Continue lisinopril 40mg  daily -Continue amlodipine 10mg  as above  #HLD -Continue atorvastatin 80mg  daily -Repeat cholesterol   #DMII Well controlled. HgA1C 6.5 -Continue management per PCP   {Are you ordering a CV Procedure (e.g. stress test, cath, DCCV, TEE, etc)?   Press F2        :    Medication Adjustments/Labs and Tests Ordered: Current medicines are reviewed at length with the patient today.  Concerns regarding medicines are outlined above.  No orders of the defined types were placed in this encounter.  No orders of the defined types were placed in this encounter.   There are no Patient Instructions on file for this visit.   Signed, , MD  01/10/2021 9:39 PM    [name]

## 2021-01-11 ENCOUNTER — Other Ambulatory Visit: Payer: Self-pay

## 2021-01-11 ENCOUNTER — Ambulatory Visit: Payer: Self-pay | Admitting: General Surgery

## 2021-01-11 ENCOUNTER — Encounter: Payer: Self-pay | Admitting: General Surgery

## 2021-01-11 ENCOUNTER — Ambulatory Visit (INDEPENDENT_AMBULATORY_CARE_PROVIDER_SITE_OTHER): Payer: Self-pay | Admitting: General Surgery

## 2021-01-11 VITALS — BP 129/71 | HR 64 | Temp 97.7°F | Resp 16 | Ht 70.0 in | Wt 224.0 lb

## 2021-01-11 DIAGNOSIS — Z09 Encounter for follow-up examination after completed treatment for conditions other than malignant neoplasm: Secondary | ICD-10-CM

## 2021-01-11 NOTE — Progress Notes (Signed)
Subjective:     Jeremy [name]  Here for postoperative visit, status post Roux-en-Y gastrojejunostomy.  Patient is doing well.  Denies any nausea, vomiting, or diarrhea.  His diet has returned to normal.  He is taking some Aleve for his back pain.  He denies any fever or chills. Objective:    BP 129/71   Pulse 64   Temp 97.7 F (36.5 C) (Other (Comment))   Resp 16   Ht 5\' 10"  (1.778 m)   Wt 224 lb (101.6 kg)   SpO2 97%   BMI 32.14 kg/m   General:  alert, cooperative and no distress  Abdomen is soft, incision healing well.  Staples removed, Steri-Strips applied.     Assessment:    Doing well postoperatively.    Plan:   May increase activity as able.  Follow-up here as needed.  I told him to try to not take too many NSAIDs over the next few weeks given his recent surgery and concern for anastomotic bleed.  He understands.

## 2021-01-12 ENCOUNTER — Ambulatory Visit: Payer: Self-pay | Admitting: Cardiology

## 2021-01-13 ENCOUNTER — Other Ambulatory Visit: Payer: Self-pay | Admitting: Internal Medicine

## 2021-01-13 DIAGNOSIS — M160 Bilateral primary osteoarthritis of hip: Secondary | ICD-10-CM

## 2021-01-13 DIAGNOSIS — G8929 Other chronic pain: Secondary | ICD-10-CM

## 2021-01-13 NOTE — Telephone Encounter (Signed)
Requested medication (s) are due for refill today: no  Requested medication (s) are on the active medication list: no  Last refill: 2/28/20222  Future visit scheduled:  no  Notes to clinic:  medication was stopped at discharge  Review for continued use and refill    Requested Prescriptions  Pending Prescriptions Disp Refills   traMADol (ULTRAM) 50 MG tablet [Pharmacy Med Name: traMADol HCL 50 MG TABS 50 Tablet] 60 tablet 1    Sig: Take 1 tablet (50 mg total) by mouth every 12 (twelve) hours as needed.      Not Delegated - Analgesics:  Opioid Agonists Failed - 01/13/2021  2:43 PM      Failed - This refill cannot be delegated      Failed - Urine Drug Screen completed in last 360 days      Passed - Valid encounter within last 6 months    Recent Outpatient Visits           3 weeks ago Preoperative evaluation to rule out surgical contraindication   [facility] Marcine Matar, MD   1 month ago Acute pain of right knee   [facility] Jonah Blue B, MD   4 months ago Type 2 diabetes mellitus with diabetic polyneuropathy, without long-term current use of insulin [facility])   [place] [facility] Marcine Matar, MD   5 months ago Acute abdominal pain   [place] [facility] Jonah Blue B, MD   7 months ago Type 2 diabetes mellitus with diabetic polyneuropathy, without long-term current use of insulin [facility])   [place] [facility] Marcine Matar, MD       Future Appointments             In 1 month Shari Prows, Kathlynn Grate, MD [facility], [facility]

## 2021-01-14 NOTE — Telephone Encounter (Signed)
Pt was scheduled to see Dr. Shari Prows in clinic for 3/30 and he cancelled.  Pt is rescheduled now to see her for 03/02/21.

## 2021-01-14 NOTE — Telephone Encounter (Signed)
Meriam Sprague, MD  [place] L 1 month ago   HP   Hi Mr. Bogart,  A couple of suggestions: 1. Take the HCTZ in the morning to try to avoid going to the bathroom at night if you are not already doing that 2. Start magnesium 400mg  (over the counter) at night. Really helps with cramps 3. Continue the potassium 4. Be sure to maintain good hydration with both water and a zero calorie drink like gatorade zero or powerade zero  If cramps become to be more bothersome, let me know and we can stop the med   This was endorsed to the pt by Dr. herself, via [place] message.

## 2021-01-14 NOTE — Telephone Encounter (Signed)
Pt was scheduled to see Dr. Shari Prows in clinic for 3/30 and cancelled.  He is rescheduled to see her in clinic on 5/18.  Will close this encounter and refer to it as needed.

## 2021-01-15 ENCOUNTER — Other Ambulatory Visit: Payer: Self-pay

## 2021-01-15 MED FILL — Lisinopril Tab 40 MG: ORAL | 30 days supply | Qty: 30 | Fill #0 | Status: AC

## 2021-01-15 MED FILL — Ropinirole Hydrochloride Tab 0.5 MG: ORAL | 30 days supply | Qty: 30 | Fill #0 | Status: AC

## 2021-01-15 MED FILL — Amlodipine Besylate Tab 10 MG (Base Equivalent): ORAL | 30 days supply | Qty: 30 | Fill #0 | Status: AC

## 2021-01-16 ENCOUNTER — Other Ambulatory Visit: Payer: Self-pay

## 2021-01-18 ENCOUNTER — Encounter: Payer: Self-pay | Admitting: General Surgery

## 2021-01-18 ENCOUNTER — Other Ambulatory Visit: Payer: Self-pay

## 2021-01-18 MED ORDER — TRAMADOL HCL 50 MG PO TABS
50.0000 mg | ORAL_TABLET | Freq: Two times a day (BID) | ORAL | 0 refills | Status: DC | PRN
Start: 2021-01-18 — End: 2021-02-19
  Filled 2021-01-18: qty 60, 30d supply, fill #0

## 2021-01-19 ENCOUNTER — Other Ambulatory Visit: Payer: Self-pay

## 2021-01-19 ENCOUNTER — Encounter: Payer: Self-pay | Admitting: Family Medicine

## 2021-01-19 ENCOUNTER — Ambulatory Visit (INDEPENDENT_AMBULATORY_CARE_PROVIDER_SITE_OTHER): Payer: Self-pay | Admitting: Family Medicine

## 2021-01-19 DIAGNOSIS — M25551 Pain in right hip: Secondary | ICD-10-CM

## 2021-01-19 DIAGNOSIS — M25561 Pain in right knee: Secondary | ICD-10-CM

## 2021-01-19 MED ORDER — CYCLOBENZAPRINE HCL 10 MG PO TABS
10.0000 mg | ORAL_TABLET | Freq: Three times a day (TID) | ORAL | 3 refills | Status: DC | PRN
  Filled 2021-01-19: qty 60, 20d supply, fill #0
  Filled 2021-02-19: qty 60, 20d supply, fill #1
  Filled 2021-03-12: qty 60, 20d supply, fill #2
  Filled 2021-04-09: qty 60, 20d supply, fill #3

## 2021-01-19 NOTE — Progress Notes (Signed)
I saw and examined the patient with Dr. Marga Hoots and agree with assessment and plan as outlined.    Prior left hip pain resolved.  Now having right hamstring pain.  We will try Flexeril, physical therapy.  If symptoms persist, then consider hip x-rays and possibly lumbar x-rays/MRI scan.
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
## 2021-01-28 MED FILL — Glipizide Tab 5 MG: ORAL | 30 days supply | Qty: 60 | Fill #0 | Status: AC

## 2021-01-28 MED FILL — Atorvastatin Calcium Tab 80 MG (Base Equivalent): ORAL | 30 days supply | Qty: 30 | Fill #0 | Status: AC

## 2021-01-28 MED FILL — Ropinirole Hydrochloride Tab 0.5 MG: ORAL | 30 days supply | Qty: 30 | Fill #1 | Status: AC

## 2021-01-29 ENCOUNTER — Encounter: Payer: Self-pay | Admitting: Family Medicine

## 2021-01-29 DIAGNOSIS — M25561 Pain in right knee: Secondary | ICD-10-CM

## 2021-01-31 ENCOUNTER — Other Ambulatory Visit: Payer: Self-pay

## 2021-02-01 ENCOUNTER — Ambulatory Visit (INDEPENDENT_AMBULATORY_CARE_PROVIDER_SITE_OTHER): Payer: Self-pay | Admitting: Physical Therapy

## 2021-02-01 ENCOUNTER — Encounter: Payer: Self-pay | Admitting: Physical Therapy

## 2021-02-01 ENCOUNTER — Other Ambulatory Visit: Payer: Self-pay

## 2021-02-01 DIAGNOSIS — M6281 Muscle weakness (generalized): Secondary | ICD-10-CM

## 2021-02-01 DIAGNOSIS — R600 Localized edema: Secondary | ICD-10-CM

## 2021-02-01 DIAGNOSIS — M25551 Pain in right hip: Secondary | ICD-10-CM

## 2021-02-01 DIAGNOSIS — R262 Difficulty in walking, not elsewhere classified: Secondary | ICD-10-CM

## 2021-02-01 DIAGNOSIS — M25561 Pain in right knee: Secondary | ICD-10-CM
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
## 2021-02-01 NOTE — Patient Instructions (Signed)
Access Code: [email or web address] URL: [email or web address] Date: 02/01/2021 Prepared by: Ivery Quale  Exercises Seated Hamstring Stretch - 2 x daily - 6 x weekly - 1 sets - 3 reps - 30 hold Standing Gastroc Stretch - 2 x daily - 6 x weekly - 1 sets - 3 reps - 30 hold Standing Hamstring Curl with Chair Support - 2 x daily - 6 x weekly - 1-2 sets - 10 reps Seated Hamstring Set - 2 x daily - 6 x weekly - 1 sets - 10 reps - 5 sec hold Supine Bridge - 2 x daily - 6 x weekly - 1-2 sets - 10 reps - 5 hold

## 2021-02-03 ENCOUNTER — Other Ambulatory Visit: Payer: Self-pay

## 2021-02-08 NOTE — Addendum Note (Signed)
Addended by: Lillia Carmel on: 02/08/2021 01:37 PM   Modules accepted: Orders

## 2021-02-10 ENCOUNTER — Ambulatory Visit (INDEPENDENT_AMBULATORY_CARE_PROVIDER_SITE_OTHER): Payer: Self-pay | Admitting: Physical Therapy

## 2021-02-10 ENCOUNTER — Encounter: Payer: Self-pay | Admitting: Physical Therapy

## 2021-02-10 ENCOUNTER — Other Ambulatory Visit: Payer: Self-pay

## 2021-02-10 DIAGNOSIS — M25551 Pain in right hip: Secondary | ICD-10-CM

## 2021-02-10 DIAGNOSIS — M6281 Muscle weakness (generalized): Secondary | ICD-10-CM

## 2021-02-10 DIAGNOSIS — M25561 Pain in right knee: Secondary | ICD-10-CM

## 2021-02-10 DIAGNOSIS — M25552 Pain in left hip: Secondary | ICD-10-CM

## 2021-02-10 DIAGNOSIS — R262 Difficulty in walking, not elsewhere classified: Secondary | ICD-10-CM

## 2021-02-10 DIAGNOSIS — R600 Localized edema: Secondary | ICD-10-CM

## 2021-02-10 DIAGNOSIS — M62838 Other muscle spasm: Secondary | ICD-10-CM
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
## 2021-02-11 MED FILL — Amlodipine Besylate Tab 10 MG (Base Equivalent): ORAL | 30 days supply | Qty: 30 | Fill #1 | Status: AC

## 2021-02-11 MED FILL — Lisinopril Tab 40 MG: ORAL | 30 days supply | Qty: 30 | Fill #1 | Status: AC

## 2021-02-12 ENCOUNTER — Other Ambulatory Visit: Payer: Self-pay

## 2021-02-12 ENCOUNTER — Ambulatory Visit (HOSPITAL_COMMUNITY)
Admission: RE | Admit: 2021-02-12 | Discharge: 2021-02-12 | Disposition: A | Discharge status: Home / Self Care | Payer: Self-pay | Source: Ambulatory Visit | Attending: Family Medicine | Admitting: Family Medicine

## 2021-02-12 DIAGNOSIS — M25561 Pain in right knee: Secondary | ICD-10-CM | POA: Insufficient documentation

## 2021-02-14 ENCOUNTER — Telehealth: Payer: Self-pay | Admitting: Family Medicine

## 2021-02-14 ENCOUNTER — Other Ambulatory Visit: Payer: Self-pay

## 2021-02-14 NOTE — Telephone Encounter (Signed)
MRI shows some arthritic changes behind the kneecap in the patellofemoral joint.  There also may be a small tear of the lateral meniscus cartilage.  Treatment options include:  -Continue with physical therapy.  -Try a one-time cortisone injection.  -Surgical consult for consideration of arthroscopic surgery.

## 2021-02-15 ENCOUNTER — Encounter: Payer: Self-pay | Admitting: Physical Therapy

## 2021-02-15 ENCOUNTER — Ambulatory Visit (INDEPENDENT_AMBULATORY_CARE_PROVIDER_SITE_OTHER): Payer: Self-pay | Admitting: Physical Therapy

## 2021-02-15 ENCOUNTER — Other Ambulatory Visit: Payer: Self-pay

## 2021-02-15 DIAGNOSIS — M25561 Pain in right knee: Secondary | ICD-10-CM

## 2021-02-15 DIAGNOSIS — R262 Difficulty in walking, not elsewhere classified: Secondary | ICD-10-CM

## 2021-02-15 DIAGNOSIS — M25551 Pain in right hip: Secondary | ICD-10-CM

## 2021-02-15 DIAGNOSIS — M6281 Muscle weakness (generalized): Secondary | ICD-10-CM

## 2021-02-15 NOTE — Therapy (Signed)
[facility] [address] [place], [place], [address] Phone: [phone]   Fax:  [phone]  Physical Therapy Treatment  Patient Details  Name: Jeremy [name] MRN: [id number] Date of Birth: 01-27-1961 Referring Provider (PT): Hilts, MD   Encounter Date: 02/15/2021   PT End of Session - 02/15/21 1412    Visit Number 3    Number of Visits 6    Date for PT Re-Evaluation 03/15/21    PT Start Time 1302    PT Stop Time 1342    PT Time Calculation (min) 40 min    Activity Tolerance Patient tolerated treatment well    Behavior During Therapy [facility] for tasks assessed/performed           Past Medical History:  Diagnosis Date  . Diabetes mellitus without complication (HCC)   . GERD (gastroesophageal reflux disease)   . Hyperlipidemia   . Hypertension   . IDA (iron deficiency anemia)   . Rash of entire body 03/2016  . Sleep apnea     Past Surgical History:  Procedure Laterality Date  . BACK SURGERY  1993   lumbar  . BIOPSY  02/07/2019   Procedure: BIOPSY;  Surgeon: West Bali, MD;  [facility];  Service: Endoscopy;;  . CERVICAL SPINE SURGERY  2000  . COLONOSCOPY N/A 02/07/2019   Dr. Darrick Penna: External hemorrhoids next colonoscopy in 10 years  . ESOPHAGOGASTRODUODENOSCOPY N/A 02/07/2019   Dr. Darrick Penna: Barrett's esophagus without dysplasia chronic inactive gastritis but no H. 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 ESOPHAGOGASTRODUODENOSCOPY N/A 01/05/2020   Procedure: ESOPHAGOGASTRODUODENOSCOPY (EGD);  Surgeon: West Bali, MD;  [facility];  Service: Endoscopy;  Laterality: N/A;  10:30am  . ESOPHAGOGASTRODUODENOSCOPY (EGD) WITH PROPOFOL N/A 12/06/2020   Procedure: ESOPHAGOGASTRODUODENOSCOPY (EGD) WITH PROPOFOL;  Surgeon: Lanelle Bal, DO;  [facility];  Service: Endoscopy;  Laterality: N/A;  11:45am  .  GASTROJEJUNOSTOMY N/A 01/03/2021   Procedure: GASTROJEJUNOSTOMY;  Surgeon: [name], MD;  Location: [facility];  Service: General;  Laterality: N/A;  . HAND SURGERY    . SAVORY DILATION N/A 01/05/2020   Procedure: SAVORY DILATION;  Surgeon: West Bali, MD;  [facility];  Service: Endoscopy;  Laterality: N/A;    There were no vitals filed for this visit.   Subjective Assessment - 02/15/21 1304    Subjective knee still wants to buckle, but pain is significantly improved.  hasn't has pain in several days, still fatigues    How long can you stand comfortably? unsure    How long can you walk comfortably? relays it feels better if he walks    Diagnostic tests XR 09/2020 "Degenerative changes lumbar spine and both hips. No acute bony  abnormality identified."    Patient Stated Goals reduce pain    Currently in Pain? No/denies                             [facility] Adult PT Treatment/Exercise - 02/15/21 1306      Knee/Hip Exercises: Stretches   Passive Hamstring Stretch Right;3 reps;30 seconds    Passive Hamstring Stretch Limitations seated    Gastroc Stretch Both;3 reps;30 seconds      Knee/Hip Exercises: Aerobic   Recumbent Bike L3 x 8 min      Knee/Hip Exercises: Machines for Strengthening   Cybex Knee Extension  RLE only 10# 2x10    Cybex Leg Press bil push 100# 3x10; LLE only 50# 3x10      Knee/Hip Exercises: Standing   Other Standing Knee Exercises RDL 10# KB 3x10                       PT Long Term Goals - 02/10/21 1513      PT LONG TERM GOAL #1   Title I with advanced HEP to include eccentric hamstring work. (Target for all goals 6 weeks 03/05/21)    Status On-going      PT LONG TERM GOAL #2   Title improve FOTO to 52% functional    Status On-going      PT LONG TERM GOAL #3   Title Reduce pain to less than 3/10 overall with ADLs and work.    Status On-going      PT LONG TERM GOAL #4   Title improve Rt knee strength to 5/5 MMT  painfree    Status On-going      PT LONG TERM GOAL #5   Status On-going                 Plan - 02/15/21 1412    Clinical Impression Statement Pt reporting minimal to no pain since last session and overall improvement in mobility.  Able to progress strengthening exercises today with expected fatigue but no increased pain.  Will continue to benefit from PT to maximize function.    Examination-Activity Limitations Bend;Locomotion Level;Lift    Examination-Participation Restrictions Community Activity;Shop;Yard Work;Occupation    Stability/Clinical Decision Making Stable/Uncomplicated    Rehab Potential Good    PT Frequency 1x / week   1-2   PT Duration 6 weeks    PT Treatment/Interventions ADLs/Self Care Home Management;Cryotherapy;Electrical Stimulation;Iontophoresis 4mg /ml Dexamethasone;Moist Heat;Ultrasound;Therapeutic activities;Therapeutic exercise;Neuromuscular re-education;Manual techniques;Patient/family education;Passive range of motion;Dry needling;Joint Manipulations;Taping;Vasopneumatic Device    PT Next Visit Plan hamstring stretching and manual, modalaties PRN; strengthening    PT Home Exercise Plan Access Code: [email or web address]    Consulted and Agree with Plan of Care Patient           Patient will benefit from skilled therapeutic intervention in order to improve the following deficits and impairments:  Decreased activity tolerance,Decreased strength,Decreased range of motion,Difficulty walking,Impaired flexibility,Pain,Increased fascial restricitons,Increased muscle spasms  Visit Diagnosis: Acute pain of right knee  Pain in right hip  Muscle weakness (generalized)  Difficulty in walking, not elsewhere classified     Problem List Patient Active Problem List   Diagnosis Date Noted  . S/P bypass gastrojejunostomy 01/03/2021  . Gastric outlet obstruction   . Duodenal stricture   . Primary osteoarthritis of both hips 11/15/2020  . Aortic atherosclerosis (HCC)  08/30/2020  . Coronary artery calcification 08/30/2020  . Bilateral carpal tunnel syndrome 06/07/2020  . Grief reaction 06/07/2020  . 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 Congenital hypertrophic pyloric stenosis   . Duodenal web   . Barrett's esophagus without dysplasia 05/19/2019  . OSA (obstructive sleep apnea) 04/30/2019  . Iron deficiency anemia 11/25/2018  . Phimosis of penis 11/19/2018  . Esophageal dysphagia 08/19/2018  . Benign prostatic hyperplasia with post-void dribbling 01/31/2018  . Absence of bladder continence 01/31/2018  . Obesity (BMI 30-39.9) 11/05/2017  . GERD (gastroesophageal reflux disease) 06/03/2017  . Chronic pain of both knees 06/03/2017  . Chronic lower back pain 06/03/2017  . Type 2 diabetes mellitus without complication, without long-term current use of insulin (HCC) 04/12/2016  .  Essential hypertension 04/12/2016      Clarita Crane, PT, DPT 02/15/21 2:19 PM    [facility] [address] [place], [place], [address] Phone: [phone]   Fax:  [phone]  Name: Jeremy [name] MRN: [id number] Date of Birth: 30-Jul-1961

## 2021-02-16 ENCOUNTER — Encounter: Payer: Self-pay | Admitting: Internal Medicine

## 2021-02-19 ENCOUNTER — Other Ambulatory Visit: Payer: Self-pay | Admitting: Internal Medicine

## 2021-02-20 NOTE — Telephone Encounter (Signed)
Requested medication (s) are due for refill today: yes  Requested medication (s) are on the active medication list: yes  Last refill:  01/18/21  Future visit scheduled: no  Notes to clinic:  med not delegated to NT to RF   Requested Prescriptions  Pending Prescriptions Disp Refills   traMADol (ULTRAM) 50 MG tablet 60 tablet 0    Sig: Take 1 tablet (50 mg total) by mouth every 12 (twelve) hours as needed.      Not Delegated - Analgesics:  Opioid Agonists Failed - 02/19/2021 10:47 PM      Failed - This refill cannot be delegated      Failed - Urine Drug Screen completed in last 360 days      Passed - Valid encounter within last 6 months    Recent Outpatient Visits           2 months ago Preoperative evaluation to rule out surgical contraindication   [facility] Marcine Matar, MD   3 months ago Acute pain of right knee   [facility] Jonah Blue B, MD   5 months ago Type 2 diabetes mellitus with diabetic polyneuropathy, without long-term current use of insulin [facility])   [facility] Marcine Matar, MD   7 months ago Acute abdominal pain   [facility] Jonah Blue B, MD   8 months ago Type 2 diabetes mellitus with diabetic polyneuropathy, without long-term current use of insulin [facility])   [facility] Marcine Matar, MD       Future Appointments             In 1 week Shari Prows, Kathlynn Grate, MD [facility], [facility]

## 2021-02-21 ENCOUNTER — Other Ambulatory Visit: Payer: Self-pay | Admitting: Internal Medicine

## 2021-02-21 ENCOUNTER — Other Ambulatory Visit: Payer: Self-pay

## 2021-02-21 ENCOUNTER — Encounter: Payer: Self-pay | Admitting: Internal Medicine

## 2021-02-21 MED ORDER — DOXYCYCLINE HYCLATE 100 MG PO TABS
100.0000 mg | ORAL_TABLET | Freq: Two times a day (BID) | ORAL | 0 refills | Status: DC
  Filled 2021-02-21: qty 20, 10d supply, fill #0

## 2021-02-21 MED ORDER — TRAMADOL HCL 50 MG PO TABS
50.0000 mg | ORAL_TABLET | Freq: Two times a day (BID) | ORAL | 0 refills | Status: DC | PRN
  Filled 2021-02-21: qty 60, 30d supply, fill #0

## 2021-02-21 MED ORDER — TAMSULOSIN HCL 0.4 MG PO CAPS
0.4000 mg | ORAL_CAPSULE | Freq: Every day | ORAL | 5 refills | Status: DC
  Filled 2021-02-21: qty 30, 30d supply, fill #0
  Filled 2021-03-30: qty 30, 30d supply, fill #1
  Filled 2021-04-25: qty 30, 30d supply, fill #2
  Filled 2021-05-25: qty 30, 30d supply, fill #3
  Filled 2021-05-30: qty 30, 30d supply, fill #4
  Filled 2021-06-27: qty 30, 30d supply, fill #5

## 2021-02-22 ENCOUNTER — Encounter: Payer: Self-pay | Admitting: Physical Therapy

## 2021-02-22 ENCOUNTER — Other Ambulatory Visit: Payer: Self-pay

## 2021-02-27 NOTE — Progress Notes (Deleted)
Cardiology Office Note:    Date:  02/27/2021   ID:  Jeremy [name], DOB 07/03/1961, MRN [id number]  PCP:  Marcine Matar, MD  [facility] Cardiologist:  Meriam Sprague, MD  [facility] Electrophysiologist:  None   Referring MD: Marcine Matar, MD   No chief complaint on file.   History of Present Illness:    Jeremy [name] is a 60 y.o. male with a hx of HTN, HLD, DMII, OSA, aortic atherosclerosis who presents to clinic for follow-up.  The patient was initially referred by hs PCP on 09/20/20 for multivessel, extensive coronary calcification noted on CT abdomen/pelvis. He was also complaining of exertional chest discomfort and SOB at that time that was relieved with rest. Symptoms were particularly worse when walking an incline. During that visit, we recommended coronary angiography but he had to re-schedule due to feeling sick with congestion, fevers and cough. He did not reschedule  He then was seen in clinic on 11/12/20 where he stated that his chest pain was occurring constantly and not exertional related. Stated it felt like reflux and improved somewhat with protonix, however, he can only take protonix 2x/week because it makes him feel bloated.. Notably he had been seen by GI in the past where he was diagnosed with "narrowings" in his duodenum one of which has been dilated but he has another one which was unable to be intervened up. Given the change in the quality of his pain, the patient preferred to undergo stress testing over cath. We obtained a myoview on 11/23/20 which was normal with no ischemia or infarction. TTE 11/11/20: LVEF 60-65%, moderate LVH, no significant valvular disease.  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 He now returns to clinic for follow-up.  Today,  Family history: Father with CAD with MI and passed away in 5s.  Mother deceased from pancreatic cancer. Brother passed away with COPD. Brother passed away from COVID-19. Brother with testicular cancer (still alive)  Past Medical History:  Diagnosis Date  . Diabetes mellitus without complication (HCC)   . GERD (gastroesophageal reflux disease)   . Hyperlipidemia   . Hypertension   . IDA (iron deficiency anemia)   . Rash of entire body 03/2016  . Sleep apnea     Past Surgical History:  Procedure Laterality Date  . BACK SURGERY  1993   lumbar  . BIOPSY  02/07/2019   Procedure: BIOPSY;  Surgeon: West Bali, MD;  [facility];  Service: Endoscopy;;  . CERVICAL SPINE SURGERY  2000  . COLONOSCOPY N/A 02/07/2019   Dr. Darrick Penna: External hemorrhoids next colonoscopy in 10 years  . ESOPHAGOGASTRODUODENOSCOPY N/A 02/07/2019   Dr. Darrick Penna: Barrett's esophagus without dysplasia chronic inactive gastritis but no H. 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 ESOPHAGOGASTRODUODENOSCOPY N/A 01/05/2020   Procedure: ESOPHAGOGASTRODUODENOSCOPY (EGD);  Surgeon: West Bali, MD;  [facility];  Service: Endoscopy;  Laterality: N/A;  10:30am  . ESOPHAGOGASTRODUODENOSCOPY (EGD) WITH PROPOFOL N/A 12/06/2020   Procedure: ESOPHAGOGASTRODUODENOSCOPY (EGD) WITH PROPOFOL;  Surgeon: Lanelle Bal, DO;  [facility];  Service: Endoscopy;  Laterality: N/A;  11:45am  . GASTROJEJUNOSTOMY N/A 01/03/2021   Procedure: GASTROJEJUNOSTOMY;  Surgeon: [name], MD;  Location: [facility];  Service: General;  Laterality: N/A;  . HAND SURGERY    .  SAVORY DILATION N/A 01/05/2020   Procedure: SAVORY DILATION;  Surgeon: West Bali, MD;  [facility];  Service: Endoscopy;  Laterality: N/A;    Current Medications: No outpatient medications have been marked as taking for the 03/02/21 encounter (Appointment) with Meriam Sprague, MD.      Allergies:   Cymbalta [duloxetine hcl], Other, and Robaxin [methocarbamol]   Social History   Socioeconomic History  . Marital status: Significant Other    Spouse name: Not on file  . Number of children: Not on file  . Years of education: Not on file  . Highest education level: Not on file  Occupational History  . Occupation: Product/process development scientist  Tobacco Use  . Smoking status: Former Smoker    Quit date: 10/16/1988    Years since quitting: 32.3  . Smokeless tobacco: Never Used  Vaping Use  . Vaping Use: Never used  Substance and Sexual Activity  . Alcohol use: No  . Drug use: No  . Sexual activity: Not on file  Other Topics Concern  . Not on file  Social History Narrative   Volunteers as IT sales professional   Social Determinants of Corporate investment banker Strain: Not on file  Food Insecurity: Not on file  Transportation Needs: Not on file  Physical Activity: Not on file  Stress: Not on file  Social Connections: Not on file     Family History: The patient's family history includes CAD in his father; Colon cancer in his paternal grandfather; Pancreatic cancer in his mother.  ROS:   Please see the history of present illness.    Review of Systems  Constitutional: Negative for chills and fever.  HENT: Negative for congestion.   Eyes: Negative for blurred vision.  Respiratory: Positive for shortness of breath.   Cardiovascular: Positive for chest pain. Negative for palpitations, orthopnea, claudication, leg swelling and PND.  Gastrointestinal: Positive for heartburn. Negative for nausea and vomiting.  Genitourinary: Negative for hematuria.  Musculoskeletal: Negative for joint pain.  Neurological: Negative for dizziness and loss of consciousness.  Endo/Heme/Allergies: Negative for polydipsia.  Psychiatric/Behavioral: Negative for substance abuse.    EKGs/Labs/Other Studies Reviewed:    Myoview 11/23/20 The following studies were reviewed today:  Nuclear stress EF:  61%.  There was no ST segment deviation noted during stress.  No T wave inversion was noted during stress.  The study is normal.  This is a low risk study.  The left ventricular ejection fraction is normal (55-65%).  TTE 11/11/20: IMPRESSIONS    1. Left ventricular ejection fraction, by estimation, is 60 to 65%. The  left ventricle has normal function. The left ventricle has no regional  wall motion abnormalities. There is moderate left ventricular hypertrophy.  Left ventricular diastolic  parameters were normal.  2. Right ventricular systolic function is normal. The right ventricular  size is normal. Tricuspid regurgitation signal is inadequate for assessing  PA pressure.  3. The mitral valve is normal in structure. Trivial mitral valve  regurgitation. No evidence of mitral stenosis.  4. The aortic valve is tricuspid. Aortic valve regurgitation is trivial.  No aortic stenosis is present.  5. The inferior vena cava is normal in size with greater than 50%  respiratory variability, suggesting right atrial pressure of 3 mmHg.   CT abdomen/pelvis: FINDINGS: Lower chest: The visualized lung bases are clear bilaterally. Extensive multi-vessel coronary artery calcification. Global cardiac size is within normal limits.  Hepatobiliary: No focal liver abnormality is seen. No gallstones, gallbladder wall  thickening, or biliary dilatation.  Pancreas: Unremarkable  Spleen: Unremarkable  Adrenals/Urinary Tract: The adrenal glands are unremarkable. Punctate 1-2 mm nonobstructing calculi are seen within the lower pole of the kidneys bilaterally. Simple cortical cyst noted within the lower pole of the right kidney. The kidneys are otherwise unremarkable. The bladder is unremarkable.  Stomach/Bowel: There is periluminal inflammatory stranding and hyperemia involving the terminal gastric antrum, pylorus, and duodenal bulb in keeping with an infectious or  inflammatory antritis/duodenitis, as can be seen with caustic ingestion, drug toxicity, Helicobacter pylori infection, and hyper secretory conditions. No evidence of perforation. No obstruction. The small and large bowel are unremarkable. Appendix normal. No free intraperitoneal gas or fluid.  Vascular/Lymphatic: Mild aortoiliac atherosclerotic calcification without evidence of aneurysm. No pathologic adenopathy within the abdomen and pelvis.  Reproductive: Prostate is unremarkable.  Other: Rectum unremarkable  Musculoskeletal: Degenerative changes seen within the lumbar spine with calcified broad-based posterior disc bulges throughout the lumbar spine resulting in multilevel central canal stenosis. No acute bone abnormality.  IMPRESSION: Inflammatory changes in keeping with infectious or inflammatory gastritis/duodenitis. No evidence of obstruction or perforation. Correlation with endoscopy would be helpful for further evaluation.  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 This was better assessed on prior MRI examination of 01/13/2017.  TTE 11/11/20: IMPRESSIONS  1. Left ventricular ejection fraction, by estimation, is 60 to 65%. The  left ventricle has normal function. The left ventricle has no regional  wall motion abnormalities. There is moderate left ventricular hypertrophy.  Left ventricular diastolic  parameters were normal.  2. Right ventricular systolic function is normal. The right ventricular  size is normal. Tricuspid regurgitation signal is inadequate for assessing  PA pressure.  3. The mitral valve is normal in structure. Trivial mitral valve  regurgitation. No evidence of mitral stenosis.  4. The aortic valve is tricuspid. Aortic valve regurgitation is trivial.  No aortic stenosis is present.  5. The inferior vena cava is normal in size with greater than 50%  respiratory  variability, suggesting right atrial pressure of 3 mmHg.   FINDINGS  Left Ventricle: Left ventricular ejection fraction, by estimation, is 60  to 65%. The left ventricle has normal function. The left ventricle has no  regional wall motion abnormalities. The left ventricular internal cavity  size was normal in size. There is  moderate left ventricular hypertrophy. Left ventricular diastolic  parameters were normal.   Right Ventricle: The right ventricular size is normal. No increase in  right ventricular wall thickness. Right ventricular systolic function is  normal. Tricuspid regurgitation signal is inadequate for assessing PA  pressure.   Left Atrium: Left atrial size was normal in size.   Right Atrium: Right atrial size was normal in size.   Pericardium: There is no evidence of pericardial effusion.   Mitral Valve: The mitral valve is normal in structure. Trivial mitral  valve regurgitation. No evidence of mitral valve stenosis.   Tricuspid Valve: The tricuspid valve is normal in structure. Tricuspid  valve regurgitation is trivial. No evidence of tricuspid stenosis.   Aortic Valve: The aortic valve is tricuspid. Aortic valve regurgitation is  trivial. No aortic stenosis is present.   Pulmonic Valve: The pulmonic valve was normal in structure. Pulmonic valve  regurgitation is trivial. No evidence of pulmonic stenosis.   Aorta: The aortic root is normal in size and structure.   Venous: The inferior vena cava is normal in size with greater than 50%  respiratory variability,  suggesting right atrial pressure of 3 mmHg.   IAS/Shunts: No atrial level shunt detected by color flow Doppler.    Recent Labs: 07/22/2020: B Natriuretic Peptide 73.5 12/31/2020: ALT 22 01/04/2021: Hemoglobin 10.8; Platelets 189 01/05/2021: BUN 15; Creatinine, Ser 0.72; Magnesium 1.9; Potassium 3.6; Sodium 135  Recent Lipid Panel    Component Value Date/Time   CHOL 150 02/05/2020 1442   TRIG 168 (H)  02/05/2020 1442   HDL 40 02/05/2020 1442   CHOLHDL 3.8 02/05/2020 1442   CHOLHDL 4.8 10/17/2016 1012   VLDL NOT CALC 10/17/2016 1012   LDLCALC 81 02/05/2020 1442      Physical Exam:    VS:  There were no vitals taken for this visit.    Wt Readings from Last 3 Encounters:  01/11/21 224 lb (101.6 kg)  12/31/20 216 lb (98 kg)  12/21/20 226 lb (102.5 kg)     GEN:  Well nourished, well developed in no acute distress HEENT: Normal CARDIAC: RRR, no murmurs, rubs, gallops RESPIRATORY:  Clear to auscultation without rales, wheezing or rhonchi  ABDOMEN: Soft, non-tender, non-distended MUSCULOSKELETAL:  No edema; No deformity  SKIN: Warm and dry NEUROLOGIC:  Alert and oriented x 3 PSYCHIATRIC:  Normal affect   ASSESSMENT:    No diagnosis found. PLAN:    In order of problems listed above:  #Chest Pain: #Gastric Outlet Obstruction/Duodenal Stricture: Likely related to gastric outlet obstruction and dudoenal stricture. Ischemic work-up with myoview negative for ischemia and TTE with normal LVEF and no significant valve disease. 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 Myoview negative for ischemia.  -Myoview negative for ischemia -TTE with normal BiV function, no significant valvular abnormalities -Continue ASA 81mg  daily -Continue lipitor 80mg  daily -Stopped coreg as patient having issues with intimacy on the medication -Continue amlodipine 10mg  daily -Continue lisinopril 40mg daily   #HTN Controlled. Goal <120s/80s -Continue lisinopril 40mg  daily -Continue amlodipine 10mg  as above -Did not tolerate coreg due to impotence -Patient will monitor blood pressure at home  #HLD -Continue atorvastatin 80mg  daily -Repeat lipids  #DMII Well controlled. HgA1C 6.5 -Continue management per PCP      Medication Adjustments/Labs and Tests Ordered: Current medicines are  reviewed at length with the patient today.  Concerns regarding medicines are outlined above.  No orders of the defined types were placed in this encounter.  No orders of the defined types were placed in this encounter.   There are no Patient Instructions on file for this visit.   Signed, Meriam SpragueHeather E Kaiah Hosea, MD  02/27/2021 9:01 PM    [facility]

## 2021-03-01 ENCOUNTER — Ambulatory Visit (INDEPENDENT_AMBULATORY_CARE_PROVIDER_SITE_OTHER): Payer: Self-pay | Admitting: Physical Therapy

## 2021-03-01 ENCOUNTER — Encounter: Payer: Self-pay | Admitting: Internal Medicine

## 2021-03-01 ENCOUNTER — Other Ambulatory Visit: Payer: Self-pay

## 2021-03-01 DIAGNOSIS — M25561 Pain in right knee: Secondary | ICD-10-CM

## 2021-03-01 DIAGNOSIS — R600 Localized edema: Secondary | ICD-10-CM

## 2021-03-01 DIAGNOSIS — M6281 Muscle weakness (generalized): Secondary | ICD-10-CM

## 2021-03-01 DIAGNOSIS — R262 Difficulty in walking, not elsewhere classified: Secondary | ICD-10-CM

## 2021-03-01 DIAGNOSIS — M25551 Pain in right hip: Secondary | ICD-10-CM

## 2021-03-01 DIAGNOSIS — R300 Dysuria: Secondary | ICD-10-CM

## 2021-03-01 NOTE — Therapy (Addendum)
[facility] [address] [place], [place], [address] Phone: [phone]   Fax:  [phone]  Physical Therapy Treatment/ Discharge   Patient Details  Name: Jeremy [name] MRN: [id number] Date of Birth: 1961/06/10 Referring Provider (PT): Hilts, MD   Encounter Date: 03/01/2021   PT End of Session - 03/01/21 1009     Visit Number 4    Number of Visits 6    Date for PT Re-Evaluation 03/15/21    PT Start Time 0935    PT Stop Time 1023    PT Time Calculation (min) 48 min    Activity Tolerance Patient tolerated treatment well    Behavior During Therapy [facility] for tasks assessed/performed             Past Medical History:  Diagnosis Date   Diabetes mellitus without complication ([place])    GERD (gastroesophageal reflux disease)    Hyperlipidemia    Hypertension    IDA (iron deficiency anemia)    Rash of entire body 03/2016   Sleep apnea     Past Surgical History:  Procedure Laterality Date   BACK SURGERY  1993   lumbar   BIOPSY  02/07/2019   Procedure: BIOPSY;  Surgeon: Danie Binder, MD;  Location: [facility];  Service: Endoscopy;;   CERVICAL SPINE SURGERY  2000   COLONOSCOPY N/A 02/07/2019   Dr. Oneida Alar: External hemorrhoids next colonoscopy in 10 years   ESOPHAGOGASTRODUODENOSCOPY N/A 02/07/2019   Dr. Oneida Alar: Barrett's esophagus without dysplasia chronic inactive gastritis but no H. 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, nonbleeding duodenal diverticulum,   ESOPHAGOGASTRODUODENOSCOPY N/A 01/05/2020   Procedure: ESOPHAGOGASTRODUODENOSCOPY (EGD);  Surgeon: Danie Binder, MD;  [facility];  Service: Endoscopy;  Laterality: N/A;  10:30am   ESOPHAGOGASTRODUODENOSCOPY (EGD) WITH PROPOFOL N/A 12/06/2020   Procedure: ESOPHAGOGASTRODUODENOSCOPY (EGD) WITH PROPOFOL;  Surgeon: Eloise Harman, DO;  [facility];  Service: Endoscopy;  Laterality: N/A;  11:45am    GASTROJEJUNOSTOMY N/A 01/03/2021   Procedure: GASTROJEJUNOSTOMY;  Surgeon: Aviva Signs, MD;  Location: [facility];  Service: General;  Laterality: N/A;   HAND SURGERY     SAVORY DILATION N/A 01/05/2020   Procedure: SAVORY DILATION;  Surgeon: Danie Binder, MD;  Location: [facility];  Service: Endoscopy;  Laterality: N/A;    There were no vitals filed for this visit.   Subjective Assessment - 03/01/21 0949     Subjective relays he has good days and bad days with his knee. Today is an ok day, no pain upon arrival but had pain earlier this week after having to craw on his knees. He also still has reports of his knee wanting to give out on him.    How long can you stand comfortably? unsure    How long can you walk comfortably? relays it feels better if he walks    Diagnostic tests XR 09/2020 "Degenerative changes lumbar spine and both hips. No acute bony  abnormality identified."    Patient Stated Goals reduce pain    Pain Onset More than a month ago                [facility] PT Assessment - 03/01/21 0001       Assessment   Medical Diagnosis Pain in Rt hip, Pain in Rt knee    Referring Provider (PT) Hilts, MD      AROM   Overall AROM Comments now [facility]  hip/knee ROM on Rt      Strength   Overall Strength Comments 5/5 except Rt hip flexion 4+ and Rt knee flexion 4+             [facility] Adult PT Treatment/Exercise - 03/01/21 0001       Knee/Hip Exercises: Stretches   Passive Hamstring Stretch Right;3 reps;30 seconds    Passive Hamstring Stretch Limitations seated    Gastroc Stretch Both;3 reps;30 seconds      Knee/Hip Exercises: Aerobic   Recumbent Bike L3 x 8 min      Knee/Hip Exercises: Machines for Strengthening   Cybex Knee Extension bilat 20# 3X10 then RLE only 10# 2x10    Cybex Knee Flexion bilat pull 35# 2X10 then had to drop to 25# for 3rd set of 10    Cybex Leg Press bil push 125# 3x10; Rt LE only 62# 3x10      Knee/Hip Exercises: Standing   Other Standing Knee  Exercises RDL 15# KB 2x10      Moist Heat Therapy   Number Minutes Moist Heat 10 Minutes    Moist Heat Location Hip;Knee                         PT Long Term Goals - 02/10/21 1513       PT LONG TERM GOAL #1   Title I with advanced HEP to include eccentric hamstring work. (Target for all goals 6 weeks 03/05/21)    Status On-going      PT LONG TERM GOAL #2   Title improve FOTO to 52% functional    Status On-going      PT LONG TERM GOAL #3   Title Reduce pain to less than 3/10 overall with ADLs and work.    Status On-going      PT LONG TERM GOAL #4   Title improve Rt knee strength to 5/5 MMT painfree    Status On-going      PT LONG TERM GOAL #5   Status On-going                   Plan - 03/01/21 1012     Clinical Impression Statement Not having much pain today upon arrival so progresed his strengthening program with good overall tolerance except for he did get lightheaded at end of session after performing dead lifts so we discontinued and had him lay down with some heat to his hamstring and his symptoms resolved. Updated ROM and strength measurments show improvements but he does still have some Rt hip/knee weakness. Continue POC    Examination-Activity Limitations Bend;Locomotion Level;Lift    Examination-Participation Restrictions Community Activity;Shop;Yard Work;Occupation    Stability/Clinical Decision Making Stable/Uncomplicated    Rehab Potential Good    PT Frequency 1x / week   1-2   PT Duration 6 weeks    PT Treatment/Interventions ADLs/Self Care Home Management;Cryotherapy;Electrical Stimulation;Iontophoresis 40m/ml Dexamethasone;Moist Heat;Ultrasound;Therapeutic activities;Therapeutic exercise;Neuromuscular re-education;Manual techniques;Patient/family education;Passive range of motion;Dry needling;Joint Manipulations;Taping;Vasopneumatic Device    PT Next Visit Plan hamstring/gastroc stretching and continue to improve strengthening    PT Home  Exercise Plan Access Code: [email or web address]   Consulted and Agree with Plan of Care Patient             Patient will benefit from skilled therapeutic intervention in order to improve the following deficits and impairments:  Decreased activity tolerance,Decreased strength,Decreased range of motion,Difficulty walking,Impaired flexibility,Pain,Increased fascial restricitons,Increased muscle spasms  Visit Diagnosis: Acute pain  of right knee  Pain in right hip  Muscle weakness (generalized)  Difficulty in walking, not elsewhere classified  Localized edema     Problem List Patient Active Problem List   Diagnosis Date Noted   S/P bypass gastrojejunostomy 01/03/2021   Gastric outlet obstruction    Duodenal stricture    Primary osteoarthritis of both hips 11/15/2020   Aortic atherosclerosis ([place]) 08/30/2020   Coronary artery calcification 08/30/2020   Bilateral carpal tunnel syndrome 06/07/2020   Grief reaction 06/07/2020   Pyloric stenosis in adult 02/05/2020   Congenital hypertrophic pyloric stenosis    Duodenal web    Barrett's esophagus without dysplasia 05/19/2019   OSA (obstructive sleep apnea) 04/30/2019   Iron deficiency anemia 11/25/2018   Phimosis of penis 11/19/2018   Esophageal dysphagia 08/19/2018   Benign prostatic hyperplasia with post-void dribbling 01/31/2018   Absence of bladder continence 01/31/2018   Obesity (BMI 30-39.9) 11/05/2017   GERD (gastroesophageal reflux disease) 06/03/2017   Chronic pain of both knees 06/03/2017   Chronic lower back pain 06/03/2017   Type 2 diabetes mellitus without complication, without long-term current use of insulin ([place]) 04/12/2016   Essential hypertension 04/12/2016    Marrianne Mood Minal Stuller,PT,DPT 03/01/2021, 10:15 AM  PHYSICAL THERAPY DISCHARGE SUMMARY  Visits from Start of Care: 4  Current functional level related to goals / functional outcomes: See note   Remaining deficits: See note   Education / Equipment: HEP    Patient agrees to discharge. Patient goals were partially met. Patient is being discharged due to not returning since the last visit.  Scot Jun, PT, DPT, OCS, ATC 04/06/21  3:40 PM     [facility] [address] [place], [place], [address] Phone: [phone]   Fax:  [phone]  Name: Jeremy [name] MRN: [id number] Date of Birth: [birth year]

## 2021-03-02 ENCOUNTER — Encounter: Payer: Self-pay | Admitting: Cardiology

## 2021-03-02 ENCOUNTER — Ambulatory Visit (INDEPENDENT_AMBULATORY_CARE_PROVIDER_SITE_OTHER): Payer: Self-pay | Admitting: Cardiology

## 2021-03-02 VITALS — BP 118/66 | HR 91 | Ht 70.0 in | Wt 212.8 lb

## 2021-03-02 DIAGNOSIS — I25118 Atherosclerotic heart disease of native coronary artery with other forms of angina pectoris: Secondary | ICD-10-CM

## 2021-03-02 DIAGNOSIS — I10 Essential (primary) hypertension: Secondary | ICD-10-CM

## 2021-03-02 DIAGNOSIS — R0789 Other chest pain: Secondary | ICD-10-CM

## 2021-03-02 DIAGNOSIS — E119 Type 2 diabetes mellitus without complications: Secondary | ICD-10-CM

## 2021-03-02 DIAGNOSIS — K311 Adult hypertrophic pyloric stenosis: Secondary | ICD-10-CM

## 2021-03-02 DIAGNOSIS — E7800 Pure hypercholesterolemia, unspecified: Secondary | ICD-10-CM

## 2021-03-02 NOTE — Patient Instructions (Signed)
Medication Instructions:   Your physician recommends that you continue on your current medications as directed. Please refer to the Current Medication list given to you today.  *If you need a refill on your cardiac medications before your next appointment, please call your pharmacy*   Follow-Up: At [facility], you and your health needs are our priority.  As part of our continuing mission to provide you with exceptional heart care, we have created designated Provider Care Teams.  These Care Teams include your primary Cardiologist (physician) and Advanced Practice Providers (APPs -  Physician Assistants and Nurse Practitioners) who all work together to provide you with the care you need, when you need it.  We recommend signing up for the patient portal called "MyChart".  Sign up information is provided on this After Visit Summary.  MyChart is used to connect with patients for Virtual Visits (Telemedicine).  Patients are able to view lab/test results, encounter notes, upcoming appointments, etc.  Non-urgent messages can be sent to your provider as well.   To learn more about what you can do with MyChart, go to [email or web address].    Your next appointment:   1 year(s)  The format for your next appointment:   In Person  Provider:   Heather Pemberton, MD     

## 2021-03-02 NOTE — Progress Notes (Signed)
Cardiology Office Note:    Date:  03/02/2021   ID:  ABISHAI VIEGAS, DOB 1961/07/10, MRN [id number]  PCP:  [name], MD  [facility] Cardiologist:  Freada Bergeron, MD  [facility] Electrophysiologist:  None   Referring MD: [name], MD   No chief complaint on file.   History of Present Illness:    Jeremy [name] is a 60 y.o. male with a hx of HTN, HLD, DMII, OSA, aortic atherosclerosis who presents to clinic for follow-up.  The patient was initially referred by hs PCP on 09/20/20 for multivessel, extensive coronary calcification noted on CT abdomen/pelvis. He was also complaining of exertional chest discomfort and SOB at that time that was relieved with rest. Symptoms were particularly worse when walking an incline. During that visit, we recommended coronary angiography but he had to re-schedule due to feeling sick with congestion, fevers and cough. He did not reschedule  He then was seen in clinic on 11/12/20 where he stated that his chest pain was occurring constantly and not exertional related. Stated it felt like reflux and improved somewhat with protonix, however, he can only take protonix 2x/week because it makes him feel bloated.. Notably he had been seen by GI in the past where he was diagnosed with "narrowings" in his duodenum one of which has been dilated but he has another one which was unable to be intervened up. Given the change in the quality of his pain, the patient preferred to undergo stress testing over cath. We obtained a myoview on 11/23/20 which was normal with no ischemia or infarction. TTE 11/11/20: LVEF 60-65%, moderate LVH, no significant valvular disease.  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 He now returns to clinic for follow-up.  Today, he reports feeling fatigued-he relates this to being put on  Doxycycline to treat his recent insect bite. He has some lightheadedness for 3-4 days but he also relates this to starting Doxycyline. He denies having any syncopal episodes. He also has not been drinking much water. Otherwise he is tolerating his medication well. His blood pressure is well controlled.   BP Readings from Last 3 Encounters:  03/02/21 118/66  01/11/21 129/71  01/05/21 140/61   He denies any exertional chest tightness, pressure, or pain, palpitations. He has no  PND, orthopnea, or LE edema. He is currently attending physical therapy and he reports having any episode of dizziness other day after transitioning from a bending down to standing position.  Family history: Father with CAD with MI and passed away in 43s. Mother deceased from pancreatic cancer. Brother passed away with COPD. Brother passed away from COVID-19. Brother with testicular cancer (still alive)  Past Medical History:  Diagnosis Date  . Diabetes mellitus without complication ([place])   . GERD (gastroesophageal reflux disease)   . Hyperlipidemia   . Hypertension   . IDA (iron deficiency anemia)   . Rash of entire body 03/2016  . Sleep apnea     Past Surgical History:  Procedure Laterality Date  . BACK SURGERY  1993   lumbar  . BIOPSY  02/07/2019   Procedure: BIOPSY;  Surgeon: Danie Binder, MD;  [facility];  Service: Endoscopy;;  . CERVICAL SPINE SURGERY  2000  . COLONOSCOPY N/A 02/07/2019   Dr. Oneida Alar: External hemorrhoids next colonoscopy in 10 years  . ESOPHAGOGASTRODUODENOSCOPY N/A 02/07/2019  Dr. Oneida Alar: Barrett's esophagus without dysplasia chronic inactive gastritis but no H. 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 ESOPHAGOGASTRODUODENOSCOPY N/A 01/05/2020   Procedure: ESOPHAGOGASTRODUODENOSCOPY (EGD);  Surgeon: Danie Binder, MD;  [facility]: [facility];  Service: Endoscopy;  Laterality:  N/A;  10:30am  . ESOPHAGOGASTRODUODENOSCOPY (EGD) WITH PROPOFOL N/A 12/06/2020   Procedure: ESOPHAGOGASTRODUODENOSCOPY (EGD) WITH PROPOFOL;  Surgeon: Eloise Harman, DO;  [facility]: [facility];  Service: Endoscopy;  Laterality: N/A;  11:45am  . GASTROJEJUNOSTOMY N/A 01/03/2021   Procedure: GASTROJEJUNOSTOMY;  Surgeon: Aviva Signs, MD;  Location: [facility];  Service: General;  Laterality: N/A;  . HAND SURGERY    . SAVORY DILATION N/A 01/05/2020   Procedure: SAVORY DILATION;  Surgeon: Danie Binder, MD;  Location: [facility];  Service: Endoscopy;  Laterality: N/A;    Current Medications: Current Meds  Medication Sig  . acetaminophen (TYLENOL) 500 MG tablet Take 1,000 mg by mouth in the morning and at bedtime.   Marland Kitchen amLODipine (NORVASC) 10 MG tablet TAKE 1 TABLET (10 MG TOTAL) BY MOUTH DAILY.  Marland Kitchen atorvastatin (LIPITOR) 80 MG tablet TAKE 1 TABLET (80 MG TOTAL) BY MOUTH DAILY.  Marland Kitchen Blood Glucose Monitoring Suppl (TRUE METRIX METER) w/Device KIT 1 each by Does not apply route 4 (four) times daily -  before meals and at bedtime.  . cyclobenzaprine (FLEXERIL) 10 MG tablet Take 1 tablet (10 mg total) by mouth 3 (three) times daily as needed for muscle spasms.  Marland Kitchen glipiZIDE (GLUCOTROL) 5 MG tablet TAKE 1 TABLET BY MOUTH IN THE MORNINGS AND 1 TABLET IN THE EVENINGS.  Marland Kitchen glucose blood test strip Use as instructed  . lisinopril (ZESTRIL) 40 MG tablet TAKE 1 TABLET (40 MG TOTAL) BY MOUTH DAILY.  Marland Kitchen loratadine (CLARITIN) 10 MG tablet Take 10 mg by mouth daily.  . naproxen sodium (ALEVE) 220 MG tablet Take 220 mg by mouth in the morning and at bedtime.  . ondansetron (ZOFRAN-ODT) 4 MG disintegrating tablet TAKE 1 TABLET (4 MG TOTAL) BY MOUTH EVERY 8 (EIGHT) HOURS AS NEEDED FOR UP TO 2 DAYS FOR NAUSEA OR VOMITING.  . pantoprazole (PROTONIX) 40 MG tablet TAKE 1 TABLET (40 MG TOTAL) BY MOUTH 2 (TWO) TIMES DAILY BEFORE A MEAL.  Marland Kitchen Potassium 99 MG TABS Take 99 mg by mouth every evening.  Marland Kitchen rOPINIRole (REQUIP) 0.5  MG tablet TAKE 1 TABLET (0.5 MG TOTAL) BY MOUTH AT BEDTIME.  . sildenafil (VIAGRA) 50 MG tablet 1 TAB 1/2 HR PRIOR TO SEX. LIMIT USE TO 1 TAB/24 HRS  . tamsulosin (FLOMAX) 0.4 MG CAPS capsule Take 1 capsule (0.4 mg total) by mouth daily.  . traMADol (ULTRAM) 50 MG tablet Take 1 tablet (50 mg total) by mouth every 12 (twelve) hours as needed.  . TRUEplus Lancets 28G MISC CHECK BLOOD SUGAR FASTING AND BEFORE MEALS AND AGAIN IF PT FEELS BAD (SYMPTOMS OF HYPO).     Allergies:   Cymbalta [duloxetine hcl], Doxycycline, Other, and Robaxin [methocarbamol]   Social History   Socioeconomic History  . Marital status: Significant Other    Spouse name: Not on file  . Number of children: Not on file  . Years of education: Not on file  . Highest education level: Not on file  Occupational History  . Occupation: Clinical biochemist  Tobacco Use  . Smoking status: Former Smoker    Quit date: 10/16/1988    Years since quitting: 32.3  .  Smokeless tobacco: Never Used  Vaping Use  . Vaping Use: Never used  Substance and Sexual Activity  . Alcohol use: No  . Drug use: No  . Sexual activity: Not on file  Other Topics Concern  . Not on file  Social History Narrative   Volunteers as Airline pilot   Social Determinants of Radio broadcast assistant Strain: Not on file  Food Insecurity: Not on file  Transportation Needs: Not on file  Physical Activity: Not on file  Stress: Not on file  Social Connections: Not on file     Family History: The patient's family history includes CAD in his father; Colon cancer in his paternal grandfather; Pancreatic cancer in his mother.  ROS:   Please see the history of present illness.    Review of Systems  Constitutional: Positive for malaise/fatigue. Negative for chills and fever.  HENT: Negative for congestion.   Eyes: Negative for blurred vision.  Respiratory: Negative for cough and shortness of breath.   Cardiovascular: Negative for chest pain, palpitations,  orthopnea, claudication, leg swelling and PND.  Gastrointestinal: Negative for abdominal pain, blood in stool, diarrhea, heartburn, nausea and vomiting.  Genitourinary: Negative for hematuria.  Musculoskeletal: Negative for joint pain.  Neurological: Negative for dizziness and loss of consciousness.  Endo/Heme/Allergies: Negative for polydipsia.  Psychiatric/Behavioral: Negative for substance abuse.    EKGs/Labs/Other Studies Reviewed:    Myoview 11/23/20 The following studies were reviewed today:  Nuclear stress EF: 61%.  There was no ST segment deviation noted during stress.  No T wave inversion was noted during stress.  The study is normal.  This is a low risk study.  The left ventricular ejection fraction is normal (55-65%).  TTE 11/11/20: IMPRESSIONS  1. Left ventricular ejection fraction, by estimation, is 60 to 65%. The  left ventricle has normal function. The left ventricle has no regional  wall motion abnormalities. There is moderate left ventricular hypertrophy.  Left ventricular diastolic  parameters were normal.  2. Right ventricular systolic function is normal. The right ventricular  size is normal. Tricuspid regurgitation signal is inadequate for assessing  PA pressure.  3. The mitral valve is normal in structure. Trivial mitral valve  regurgitation. No evidence of mitral stenosis.  4. The aortic valve is tricuspid. Aortic valve regurgitation is trivial.  No aortic stenosis is present.  5. The inferior vena cava is normal in size with greater than 50%  respiratory variability, suggesting right atrial pressure of 3 mmHg.   CT abdomen/pelvis: FINDINGS: Lower chest: The visualized lung bases are clear bilaterally. Extensive multi-vessel coronary artery calcification. Global cardiac size is within normal limits.  Hepatobiliary: No focal liver abnormality is seen. No gallstones, gallbladder wall thickening, or biliary dilatation.  Pancreas:  Unremarkable  Spleen: Unremarkable  Adrenals/Urinary Tract: The adrenal glands are unremarkable. Punctate 1-2 mm nonobstructing calculi are seen within the lower pole of the kidneys bilaterally. Simple cortical cyst noted within the lower pole of the right kidney. The kidneys are otherwise unremarkable. The bladder is unremarkable.  Stomach/Bowel: There is periluminal inflammatory stranding and hyperemia involving the terminal gastric antrum, pylorus, and duodenal bulb in keeping with an infectious or inflammatory antritis/duodenitis, as can be seen with caustic ingestion, drug toxicity, Helicobacter pylori infection, and hyper secretory conditions. No evidence of perforation. No obstruction. The small and large bowel are unremarkable. Appendix normal. No free intraperitoneal gas or fluid.  Vascular/Lymphatic: Mild aortoiliac atherosclerotic calcification without evidence of aneurysm. No pathologic adenopathy within the abdomen and pelvis.  Reproductive: Prostate is unremarkable.  Other: Rectum unremarkable  Musculoskeletal: Degenerative changes seen within the lumbar spine with calcified broad-based posterior disc bulges throughout the lumbar spine resulting in multilevel central canal stenosis. No acute bone abnormality.  IMPRESSION: Inflammatory changes in keeping with infectious or inflammatory gastritis/duodenitis. No evidence of obstruction or perforation. Correlation with endoscopy would be helpful for further evaluation.  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 This was better assessed on prior MRI examination of 01/13/2017.  TTE 11/11/20: IMPRESSIONS  1. Left ventricular ejection fraction, by estimation, is 60 to 65%. The  left ventricle has normal function. The left ventricle has no regional  wall motion abnormalities. There is moderate left ventricular hypertrophy.  Left ventricular  diastolic  parameters were normal.  2. Right ventricular systolic function is normal. The right ventricular  size is normal. Tricuspid regurgitation signal is inadequate for assessing  PA pressure.  3. The mitral valve is normal in structure. Trivial mitral valve  regurgitation. No evidence of mitral stenosis.  4. The aortic valve is tricuspid. Aortic valve regurgitation is trivial.  No aortic stenosis is present.  5. The inferior vena cava is normal in size with greater than 50%  respiratory variability, suggesting right atrial pressure of 3 mmHg.   FINDINGS  Left Ventricle: Left ventricular ejection fraction, by estimation, is 60  to 65%. The left ventricle has normal function. The left ventricle has no  regional wall motion abnormalities. The left ventricular internal cavity  size was normal in size. There is  moderate left ventricular hypertrophy. Left ventricular diastolic  parameters were normal.   Right Ventricle: The right ventricular size is normal. No increase in  right ventricular wall thickness. Right ventricular systolic function is  normal. Tricuspid regurgitation signal is inadequate for assessing PA  pressure.   Left Atrium: Left atrial size was normal in size.   Right Atrium: Right atrial size was normal in size.   Pericardium: There is no evidence of pericardial effusion.   Mitral Valve: The mitral valve is normal in structure. Trivial mitral  valve regurgitation. No evidence of mitral valve stenosis.   Tricuspid Valve: The tricuspid valve is normal in structure. Tricuspid  valve regurgitation is trivial. No evidence of tricuspid stenosis.   Aortic Valve: The aortic valve is tricuspid. Aortic valve regurgitation is  trivial. No aortic stenosis is present.   Pulmonic Valve: The pulmonic valve was normal in structure. Pulmonic valve  regurgitation is trivial. No evidence of pulmonic stenosis.   Aorta: The aortic root is normal in size and structure.    Venous: The inferior vena cava is normal in size with greater than 50%  respiratory variability, suggesting right atrial pressure of 3 mmHg.   IAS/Shunts: No atrial level shunt detected by color flow Doppler.    Recent Labs: 07/22/2020: B Natriuretic Peptide 73.5 12/31/2020: ALT 22 01/04/2021: Hemoglobin 10.8; Platelets 189 01/05/2021: BUN 15; Creatinine, Ser 0.72; Magnesium 1.9; Potassium 3.6; Sodium 135  Recent Lipid Panel    Component Value Date/Time   CHOL 150 02/05/2020 1442   TRIG 168 (H) 02/05/2020 1442   HDL 40 02/05/2020 1442   CHOLHDL 3.8 02/05/2020 1442   CHOLHDL 4.8 10/17/2016 1012   VLDL NOT CALC 10/17/2016 1012   LDLCALC 81 02/05/2020 1442      Physical Exam:    VS:  BP 118/66   Pulse 91   Ht 5' 10"  (1.778 m)   Wt 212 lb 12.8 oz (96.5  kg)   SpO2 98%   BMI 30.53 kg/m     Wt Readings from Last 3 Encounters:  03/02/21 212 lb 12.8 oz (96.5 kg)  01/11/21 224 lb (101.6 kg)  12/31/20 216 lb (98 kg)     GEN:  Well nourished, well developed in no acute distress HEENT: Normal CARDIAC: RRR, 1/6 systolic murmurs, no rubs or gallops RESPIRATORY:  Clear to auscultation without rales, wheezing or rhonchi  ABDOMEN: Soft, non-tender, non-distended MUSCULOSKELETAL:  No edema; No deformity  SKIN: Warm and dry NEUROLOGIC:  Alert and oriented x 3 PSYCHIATRIC:  Normal affect   ASSESSMENT:    1. Coronary artery disease of native artery of native heart with stable angina pectoris ([place])   2. Pure hypercholesterolemia   3. Type 2 diabetes mellitus without complication, without long-term current use of insulin ([place])   4. Primary hypertension   5. Other chest pain   6. Gastric outlet obstruction    PLAN:    In order of problems listed above:  #Chest Pain: #Gastric Outlet Obstruction/Duodenal Stricture: Likely related to gastric outlet obstruction and dudoenal stricture. Ischemic work-up with myoview negative for ischemia and TTE with normal LVEF and no significant  valve disease. 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 Myoview negative for ischemia. Continue aggressive medical management and lifestyle modifications. -Myoview negative for ischemia -TTE with normal BiV function, no significant valvular abnormalities -Continue ASA 91m daily -Continue lipitor 87mdaily -Stopped coreg as patient having issues with intimacy on the medication -Continue amlodipine 1084maily -Continue lisinopril 51m18mly  #HTN Controlled. Goal <120s/80s -Continue lisinopril 51mg48mly -Continue amlodipine 10mg 27mbove -Did not tolerate coreg due to impotence -Patient will monitor blood pressure at home  #HLD: LDL 81 on 02/05/20. -Continue atorvastatin 80mg d63m -Repeat lipids at next visit  #DMII Well controlled. HgA1C 6.5 -Continue management per PCP   Medication Adjustments/Labs and Tests Ordered: Current medicines are reviewed at length with the patient today.  Concerns regarding medicines are outlined above.  No orders of the defined types were placed in this encounter.  No orders of the defined types were placed in this encounter.   Patient Instructions  Medication Instructions:   Your physician recommends that you continue on your current medications as directed. Please refer to the Current Medication list given to you today.  *If you need a refill on your cardiac medications before your next appointment, please call your pharmacy*   Follow-Up: At [facility] your health needs are our priority.  As part of our continuing mission to provide you with exceptional heart care, we have created designated Provider Care Teams.  These Care Teams include your primary Cardiologist (physician) and Advanced Practice Providers (APPs -  Physician Assistants and Nurse Practitioners) who all work together to provide you with the care you need, when you  need it.  We recommend signing up for the patient portal called "MyChart".  Sign up information is provided on this After Visit Summary.  MyChart is used to connect with patients for Virtual Visits (Telemedicine).  Patients are able to view lab/test results, encounter notes, upcoming appointments, etc.  Non-urgent messages can be sent to your provider as well.   To learn more about what you can do with MyChart, go to [email or web address] next appointment:   1 year(s)  The format for your next appointment:   In Person  Provider:  Gwyndolyn Kaufman, MD        I,Alexis Bryant,acting as a scribe for Freada Bergeron, MD.,have documented all relevant documentation on the behalf of Freada Bergeron, MD,as directed by  Freada Bergeron, MD while in the presence of Freada Bergeron, MD.  I, Freada Bergeron, MD, have reviewed all documentation for this visit. The documentation on 03/02/21 for the exam, diagnosis, procedures, and orders are all accurate and complete. Signed, Freada Bergeron, MD  03/02/2021 4:13 PM    [facility]

## 2021-03-03 ENCOUNTER — Other Ambulatory Visit: Payer: Self-pay

## 2021-03-03 ENCOUNTER — Ambulatory Visit: Payer: Self-pay | Attending: Internal Medicine

## 2021-03-03 DIAGNOSIS — R300 Dysuria: Secondary | ICD-10-CM

## 2021-03-04 ENCOUNTER — Ambulatory Visit: Payer: Self-pay | Attending: Family Medicine

## 2021-03-05 ENCOUNTER — Other Ambulatory Visit: Payer: Self-pay | Admitting: Internal Medicine

## 2021-03-05 DIAGNOSIS — E119 Type 2 diabetes mellitus without complications: Secondary | ICD-10-CM

## 2021-03-05 LAB — URINALYSIS, ROUTINE W REFLEX MICROSCOPIC
Bilirubin, UA: NEGATIVE
Glucose, UA: NEGATIVE
Ketones, UA: NEGATIVE
Leukocytes,UA: NEGATIVE
Nitrite, UA: NEGATIVE
Protein,UA: NEGATIVE
RBC, UA: NEGATIVE
Specific Gravity, UA: 1.017 (ref 1.005–1.030)
Urobilinogen, Ur: 0.2 mg/dL (ref 0.2–1.0)
pH, UA: 5.5 (ref 5.0–7.5)

## 2021-03-05 MED FILL — Atorvastatin Calcium Tab 80 MG (Base Equivalent): ORAL | 30 days supply | Qty: 30 | Fill #1 | Status: AC

## 2021-03-06 NOTE — Telephone Encounter (Signed)
Requested medication (s) are due for refill today: yes  Requested medication (s) are on the active medication list: yes  Last refill:  12/28/20  Future visit scheduled: no  Notes to clinic:  overdue labs HgbA1C   Requested Prescriptions  Pending Prescriptions Disp Refills   glipiZIDE (GLUCOTROL) 5 MG tablet 60 tablet 1    Sig: TAKE 1 TABLET BY MOUTH IN THE MORNINGS AND 1 TABLET IN THE EVENINGS.      Endocrinology:  Diabetes - Sulfonylureas Passed - 03/05/2021 11:31 PM      Passed - HBA1C is between 0 and 7.9 and within 180 days    HbA1c, POC (prediabetic range)  Date Value Ref Range Status  09/05/2019 6.3 5.7 - 6.4 % Corrected   HbA1c, POC (controlled diabetic range)  Date Value Ref Range Status  12/21/2020 6.7 0.0 - 7.0 % Final   Hgb A1c MFr Bld  Date Value Ref Range Status  01/03/2021 7.2 (H) 4.8 - 5.6 % Final    Comment:    (NOTE) Pre diabetes:          5.7%-6.4%  Diabetes:              >6.4%  Glycemic control for   <7.0% adults with diabetes           Passed - Valid encounter within last 6 months    Recent Outpatient Visits           2 months ago Preoperative evaluation to rule out surgical contraindication   [facility] Jonah Blue B, MD   3 months ago Acute pain of right knee   [facility] Jonah Blue B, MD   6 months ago Type 2 diabetes mellitus with diabetic polyneuropathy, without long-term current use of insulin [facility])   [place] [facility] Marcine Matar, MD   7 months ago Acute abdominal pain   [place] [facility] Jonah Blue B, MD   9 months ago Type 2 diabetes mellitus with diabetic polyneuropathy, without long-term current use of insulin [facility])    [facility] Marcine Matar, MD

## 2021-03-07 ENCOUNTER — Other Ambulatory Visit: Payer: Self-pay

## 2021-03-08 ENCOUNTER — Encounter: Payer: Self-pay | Admitting: Physical Therapy

## 2021-03-08 ENCOUNTER — Other Ambulatory Visit: Payer: Self-pay

## 2021-03-08 MED ORDER — GLIPIZIDE 5 MG PO TABS
ORAL_TABLET | ORAL | 1 refills | Status: DC
  Filled 2021-03-08: qty 60, 30d supply, fill #0
  Filled 2021-04-08: qty 60, 30d supply, fill #1

## 2021-03-09 ENCOUNTER — Encounter (HOSPITAL_BASED_OUTPATIENT_CLINIC_OR_DEPARTMENT_OTHER): Payer: Self-pay | Admitting: Emergency Medicine

## 2021-03-09 ENCOUNTER — Emergency Department (HOSPITAL_BASED_OUTPATIENT_CLINIC_OR_DEPARTMENT_OTHER)
Admission: EM | Admit: 2021-03-09 | Discharge: 2021-03-09 | Disposition: A | Discharge status: Home / Self Care | Payer: Self-pay | Attending: Emergency Medicine | Admitting: Emergency Medicine

## 2021-03-09 ENCOUNTER — Other Ambulatory Visit: Payer: Self-pay

## 2021-03-09 DIAGNOSIS — E119 Type 2 diabetes mellitus without complications: Secondary | ICD-10-CM | POA: Insufficient documentation

## 2021-03-09 DIAGNOSIS — Z7984 Long term (current) use of oral hypoglycemic drugs: Secondary | ICD-10-CM | POA: Insufficient documentation

## 2021-03-09 DIAGNOSIS — D649 Anemia, unspecified: Secondary | ICD-10-CM | POA: Insufficient documentation

## 2021-03-09 DIAGNOSIS — Z87891 Personal history of nicotine dependence: Secondary | ICD-10-CM | POA: Insufficient documentation

## 2021-03-09 DIAGNOSIS — R531 Weakness: Secondary | ICD-10-CM

## 2021-03-09 DIAGNOSIS — I10 Essential (primary) hypertension: Secondary | ICD-10-CM | POA: Insufficient documentation

## 2021-03-09 DIAGNOSIS — M791 Myalgia, unspecified site: Secondary | ICD-10-CM | POA: Insufficient documentation

## 2021-03-09 DIAGNOSIS — E871 Hypo-osmolality and hyponatremia: Secondary | ICD-10-CM | POA: Insufficient documentation

## 2021-03-09 DIAGNOSIS — Z79899 Other long term (current) drug therapy: Secondary | ICD-10-CM | POA: Insufficient documentation

## 2021-03-09 LAB — URINALYSIS, ROUTINE W REFLEX MICROSCOPIC
Bilirubin Urine: NEGATIVE
Glucose, UA: >1000 mg/dL — AB
Hgb urine dipstick: NEGATIVE
Ketones, ur: NEGATIVE mg/dL
Leukocytes,Ua: NEGATIVE
Nitrite: NEGATIVE
Protein, ur: NEGATIVE mg/dL
Specific Gravity, Urine: 1.013 (ref 1.005–1.030)
pH: 5.5 (ref 5.0–8.0)

## 2021-03-09 LAB — CBC WITH DIFFERENTIAL/PLATELET
Abs Immature Granulocytes: 0.03 10*3/uL (ref 0.00–0.07)
Basophils Absolute: 0.1 10*3/uL (ref 0.0–0.1)
Basophils Relative: 1 %
Eosinophils Absolute: 0.1 10*3/uL (ref 0.0–0.5)
Eosinophils Relative: 1 %
HCT: 27.6 % — ABNORMAL LOW (ref 39.0–52.0)
Hemoglobin: 8.7 g/dL — ABNORMAL LOW (ref 13.0–17.0)
Immature Granulocytes: 0 %
Lymphocytes Relative: 14 %
Lymphs Abs: 1.4 10*3/uL (ref 0.7–4.0)
MCH: 26.9 pg (ref 26.0–34.0)
MCHC: 31.5 g/dL (ref 30.0–36.0)
MCV: 85.4 fL (ref 80.0–100.0)
Monocytes Absolute: 0.9 10*3/uL (ref 0.1–1.0)
Monocytes Relative: 9 %
Neutro Abs: 7.6 10*3/uL (ref 1.7–7.7)
Neutrophils Relative %: 75 %
Platelets: 304 10*3/uL (ref 150–400)
RBC: 3.23 MIL/uL — ABNORMAL LOW (ref 4.22–5.81)
RDW: 13.1 % (ref 11.5–15.5)
WBC: 10 10*3/uL (ref 4.0–10.5)
nRBC: 0 % (ref 0.0–0.2)

## 2021-03-09 LAB — COMPREHENSIVE METABOLIC PANEL
ALT: 20 U/L (ref 0–44)
AST: 16 U/L (ref 15–41)
Albumin: 3.8 g/dL (ref 3.5–5.0)
Alkaline Phosphatase: 73 U/L (ref 38–126)
Anion gap: 8 (ref 5–15)
BUN: 15 mg/dL (ref 6–20)
CO2: 22 mmol/L (ref 22–32)
Calcium: 8.4 mg/dL — ABNORMAL LOW (ref 8.9–10.3)
Chloride: 98 mmol/L (ref 98–111)
Creatinine, Ser: 0.86 mg/dL (ref 0.61–1.24)
GFR, Estimated: >60 mL/min (ref >60)
Glucose, Bld: 183 mg/dL — ABNORMAL HIGH (ref 70–99)
Potassium: 4.1 mmol/L (ref 3.5–5.1)
Sodium: 128 mmol/L — ABNORMAL LOW (ref 135–145)
Total Bilirubin: 0.3 mg/dL (ref 0.3–1.2)
Total Protein: 6.8 g/dL (ref 6.5–8.1)

## 2021-03-09 LAB — MAGNESIUM: Magnesium: 1.9 mg/dL (ref 1.7–2.4)

## 2021-03-09 LAB — OCCULT BLOOD X 1 CARD TO LAB, STOOL: Fecal Occult Bld: NEGATIVE

## 2021-03-09 MED ORDER — SODIUM CHLORIDE 0.9 % IV BOLUS
1000.0000 mL | Freq: Once | INTRAVENOUS | Status: AC
  Administered 2021-03-09: 1000 mL via INTRAVENOUS

## 2021-03-09 NOTE — ED Triage Notes (Signed)
Pt presents to ED POV. Pt c/o fatigue x1w and intermittent L side HA.

## 2021-03-09 NOTE — ED Provider Notes (Signed)
[facility] Provider Note  CSN: [id number] Arrival date & time: 03/09/21 1942    History Chief Complaint  Patient presents with  . Fatigue    HPI  Jeremy [name] is a 60 y.o. 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 He had been placed on doxycycline by PCP for a tick bite and felt worse after beginning the Abx so he stopped taking them. He has not improved despite stopping the doxy, drinking lots of fluids and resting. He denies fevers, cough, congestion, abdominal pain, diarrhea or melena. He has had some increase in his restless leg symptoms. He has not been to PCP for same. He was at cardiology office for routine follow up about a week ago, vitals were neg then.    Past Medical History:  Diagnosis Date  . Diabetes mellitus without complication ([place])   . GERD (gastroesophageal reflux disease)   . Hyperlipidemia   . Hypertension   . IDA (iron deficiency anemia)   . Rash of entire body 03/2016  . Sleep apnea     Past Surgical History:  Procedure Laterality Date  . BACK SURGERY  1993   lumbar  . BIOPSY  02/07/2019   Procedure: BIOPSY;  Surgeon: Danie Binder, MD;  [facility];  Service: Endoscopy;;  . CERVICAL SPINE SURGERY  2000  . COLONOSCOPY N/A 02/07/2019   Dr. Oneida Alar: External hemorrhoids next colonoscopy in 10 years  . ESOPHAGOGASTRODUODENOSCOPY N/A 02/07/2019   Dr. Oneida Alar: Barrett's esophagus without dysplasia chronic inactive gastritis but no H. 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 ESOPHAGOGASTRODUODENOSCOPY N/A 01/05/2020   Procedure: ESOPHAGOGASTRODUODENOSCOPY (EGD);  Surgeon: Danie Binder, MD;  [facility];   Service: Endoscopy;  Laterality: N/A;  10:30am  . ESOPHAGOGASTRODUODENOSCOPY (EGD) WITH PROPOFOL N/A 12/06/2020   Procedure: ESOPHAGOGASTRODUODENOSCOPY (EGD) WITH PROPOFOL;  Surgeon: Eloise Harman, DO;  [facility];  Service: Endoscopy;  Laterality: N/A;  11:45am  . GASTROJEJUNOSTOMY N/A 01/03/2021   Procedure: GASTROJEJUNOSTOMY;  Surgeon: Aviva Signs, MD;  Location: [facility];  Service: General;  Laterality: N/A;  . HAND SURGERY    . SAVORY DILATION N/A 01/05/2020   Procedure: SAVORY DILATION;  Surgeon: Danie Binder, MD;  Location[facility];  Service: Endoscopy;  Laterality: N/A;    Family History  Problem Relation Age of Onset  . Pancreatic cancer Mother   . CAD Father   . Colon cancer Paternal Grandfather     Social History   Tobacco Use  . Smoking status: Former Smoker    Quit date: 10/16/1988    Years since quitting: 32.4  . Smokeless tobacco: Never Used  Vaping Use  . Vaping Use: Never used  Substance Use Topics  . Alcohol use: No  . Drug use: No     Home Medications Prior to Admission medications   Medication Sig Start Date End Date Taking? Authorizing Provider  acetaminophen (TYLENOL) 500 MG tablet Take 1,000 mg by mouth in the morning and at bedtime.    Yes [provider]  amLODipine (NORVASC) 10 MG tablet TAKE 1 TABLET (10 MG TOTAL) BY MOUTH DAILY. 11/12/20 11/12/21 Yes Pemberton, Greer Ee, MD  atorvastatin (LIPITOR) 80 MG tablet TAKE 1 TABLET (80 MG  TOTAL) BY MOUTH DAILY. 09/20/20 09/20/21 Yes Freada Bergeron, MD  Blood Glucose Monitoring Suppl (TRUE METRIX METER) w/Device KIT 1 each by Does not apply route 4 (four) times daily -  before meals and at bedtime. 04/12/16  Yes Elsie Stain, MD  cyclobenzaprine (FLEXERIL) 10 MG tablet Take 1 tablet (10 mg total) by mouth 3 (three) times daily as needed for muscle spasms. 01/19/21  Yes Hilts, Legrand Como, MD  glipiZIDE (GLUCOTROL) 5 MG tablet TAKE 1 TABLET BY MOUTH IN THE MORNINGS AND 1 TABLET  IN THE EVENINGS. 03/08/21 03/08/22 Yes [name], MD  glucose blood test strip Use as instructed 07/28/20  Yes [name], MD  lisinopril (ZESTRIL) 40 MG tablet TAKE 1 TABLET (40 MG TOTAL) BY MOUTH DAILY. 09/20/20 09/20/21 Yes Freada Bergeron, MD  loratadine (CLARITIN) 10 MG tablet Take 10 mg by mouth daily.   Yes [provider]  pantoprazole (PROTONIX) 40 MG tablet TAKE 1 TABLET (40 MG TOTAL) BY MOUTH 2 (TWO) TIMES DAILY BEFORE A MEAL. 03/19/20 03/19/21 Yes [name], MD  Potassium 99 MG TABS Take 99 mg by mouth every evening.   Yes [provider]  rOPINIRole (REQUIP) 0.5 MG tablet TAKE 1 TABLET (0.5 MG TOTAL) BY MOUTH AT BEDTIME. 09/13/20 09/13/21 Yes [name], MD  tamsulosin (FLOMAX) 0.4 MG CAPS capsule Take 1 capsule (0.4 mg total) by mouth daily. 02/21/21  Yes [name], MD  traMADol (ULTRAM) 50 MG tablet Take 1 tablet (50 mg total) by mouth every 12 (twelve) hours as needed. 01/14/21  Yes [name], MD  TRUEplus Lancets 28G MISC CHECK BLOOD SUGAR FASTING AND BEFORE MEALS AND AGAIN IF PT FEELS BAD (SYMPTOMS OF HYPO). 07/29/20 07/29/21 Yes [name], MD  naproxen sodium (ALEVE) 220 MG tablet Take 220 mg by mouth in the morning and at bedtime.    [provider]  ondansetron (ZOFRAN-ODT) 4 MG disintegrating tablet TAKE 1 TABLET (4 MG TOTAL) BY MOUTH EVERY 8 (EIGHT) HOURS AS NEEDED FOR UP TO 2 DAYS FOR NAUSEA OR VOMITING. 07/22/20 07/22/21  Asencion Noble, MD  sildenafil (VIAGRA) 50 MG tablet 1 TAB 1/2 HR PRIOR TO SEX. LIMIT USE TO 1 TAB/24 HRS 12/21/20 12/21/21  [name], MD     Allergies    Cymbalta [duloxetine hcl], Doxycycline, Other, and Robaxin [methocarbamol]   Review of Systems   Review of Systems A comprehensive review of systems was completed and negative except as noted in HPI.    Physical Exam BP (!) 141/54   Pulse 95   Temp 99 F (37.2 C) (Oral)   Resp 18   Ht 5' 10"  (1.778 m)   Wt  98 kg   SpO2 100%   BMI 30.99 kg/m   Physical Exam Vitals and nursing note reviewed.  Constitutional:      Appearance: Normal appearance.  HENT:     Head: Normocephalic and atraumatic.     Nose: Nose normal.     Mouth/Throat:     Mouth: Mucous membranes are moist.  Eyes:     Extraocular Movements: Extraocular movements intact.     Conjunctiva/sclera: Conjunctivae normal.  Cardiovascular:     Rate and Rhythm: Normal rate.  Pulmonary:     Effort: Pulmonary effort is normal.     Breath sounds: Normal breath sounds.  Abdominal:     General: Abdomen is flat.     Palpations: Abdomen is soft.     Tenderness: There  is no abdominal tenderness.  Musculoskeletal:        General: No swelling. Normal range of motion.     Cervical back: Neck supple.  Skin:    General: Skin is warm and dry.     Findings: No rash.  Neurological:     General: No focal deficit present.     Mental Status: He is alert.  Psychiatric:        Mood and Affect: Mood normal.      ED Results / Procedures / Treatments   Labs (all labs ordered are listed, but only abnormal results are displayed) Labs Reviewed  COMPREHENSIVE METABOLIC PANEL - Abnormal; Notable for the following components:      Result Value   Sodium 128 (*)    Glucose, Bld 183 (*)    Calcium 8.4 (*)    All other components within normal limits  CBC WITH DIFFERENTIAL/PLATELET - Abnormal; Notable for the following components:   RBC 3.23 (*)    Hemoglobin 8.7 (*)    HCT 27.6 (*)    All other components within normal limits  URINALYSIS, ROUTINE W REFLEX MICROSCOPIC - Abnormal; Notable for the following components:   Color, Urine COLORLESS (*)    Glucose, UA >1,000 (*)    All other components within normal limits  MAGNESIUM  OCCULT BLOOD X 1 CARD TO LAB, STOOL    EKG EKG Interpretation  Date/Time:  Wednesday Mar 09 2021 20:05:26 EDT Ventricular Rate:  104 PR Interval:  182 QRS Duration: 98 QT Interval:  321 QTC  Calculation: 423 R Axis:   72 Text Interpretation: Sinus tachycardia inferiorq waves Since last tracing Rate faster Otherwise no significant change Confirmed by Calvert Cantor [phone]) on 03/09/2021 8:33:29 PM    Radiology No results found.  Procedures Procedures  Medications Ordered in the ED Medications  sodium chloride 0.9 % bolus 1,000 mL (0 mLs Intravenous Stopped 03/09/21 2134)     MDM Rules/Calculators/A&P MDM Patient with multiple vague symptoms, overall reassuring exam. Will check basic labs, give IVF and reassess.  ED Course  I have reviewed the triage vital signs and the nursing notes.  Pertinent labs & imaging results that were available during my care of the patient were reviewed by me and considered in my medical decision making (see chart for details).  Clinical Course as of 03/09/21 2159  Wed Mar 09, 2021  2036 CBC shows anemia is worse than baseline but not in need of transfusion. MCV is normal. Patient has been on iron supplements before but stopped due to the side effect of "black sticky stools" [CS]  2045 CMP shows moderate hyponatremia. Magnesium is normal.  [CS]  2109 Rectal: Chaperone present, no hemorrhoids or fissures, no mass or tenderness, scant stool in rectum, no blood or melena.  [CS]  2138 Hemoccult is neg. No UTI.  [CS]  2156 Patient feeling better after IVF. Vitals remain normal. Recommend close PCP follow up for evaluation of his anemia and hyponatremia. Advised to increase the sodium in his diet and to avoid drinking too much plain water. RTED for any other concerns.  [CS]    Clinical Course User Index [CS] Truddie Hidden, MD    Final Clinical Impression(s) / ED Diagnoses Final diagnoses:  General weakness  Anemia, unspecified type  Hyponatremia    Rx / DC Orders ED Discharge Orders    None       Truddie Hidden, MD 03/09/21 2159

## 2021-03-10 ENCOUNTER — Ambulatory Visit: Payer: Self-pay | Admitting: *Deleted

## 2021-03-10 DIAGNOSIS — S90869D Insect bite (nonvenomous), unspecified foot, subsequent encounter: Secondary | ICD-10-CM

## 2021-03-10 DIAGNOSIS — R6889 Other general symptoms and signs: Secondary | ICD-10-CM

## 2021-03-10 DIAGNOSIS — D508 Other iron deficiency anemias: Secondary | ICD-10-CM

## 2021-03-10 DIAGNOSIS — E871 Hypo-osmolality and hyponatremia: Secondary | ICD-10-CM

## 2021-03-10 DIAGNOSIS — W57XXXD Bitten or stung by nonvenomous insect and other nonvenomous arthropods, subsequent encounter: Secondary | ICD-10-CM

## 2021-03-10 NOTE — Telephone Encounter (Signed)
Seen in the ED yesterday for dizziness. Received IV fluids, labs, ekg and sent home.  Dizziness occurs after standing up and with walking for several days now. Lower right-sided back pain continues. No difficulty with voiding. Electrolytes and hgb low on labs.  Urine glucose is very high on UA but could have been taken after IV fluids.  Hgb 8.7 and Hct 27.6, both lower than 2 months ago. Recent history of general surgery and blood transfusion back in late March. No b/p or glucose kit at home. Admits to not drinking enough fluids thru-out the day. Care Advice offered including purchase glucose and b/p kit to assess both daily for now. Increase water intake to 5 cups started today. Lie down with legs/feet up for 15 minutes when feeling lightheaded. He has ask Cardiology about stopping lisinopril because he feels this may be causing the dizziness-was told to keep taking as b/p is in good control. He stopped taking iron tabs due to sticky stools. Encouraged to start again today.  Has a recent history of  No appointments available but is looking for any advice from [place].  He understands when to go to the ED if needed. Routing to pcp  Reason for Disposition . [1] MODERATE dizziness (e.g., interferes with normal activities) AND [2] has been evaluated by physician for this  Answer Assessment - Initial Assessment Questions 1. DESCRIPTION: "Describe your dizziness."     Like passing out 2. LIGHTHEADED: "Do you feel lightheaded?" (e.g., somewhat faint, woozy, weak upon standing)     Upon standing 3. VERTIGO: "Do you feel like either you or the room is spinning or tilting?" (i.e. vertigo)     no 4. SEVERITY: "How bad is it?"  "Do you feel like you are going to faint?" "Can you stand and walk?"   - MILD: Feels slightly dizzy, but walking normally.   - MODERATE: Feels unsteady when walking, but not falling; interferes with normal activities (e.g., school, work).   - SEVERE: Unable to walk without  falling, or requires assistance to walk without falling; feels like passing out now.      Holds on to items 5. ONSET:  "When did the dizziness begin?"     Several days, getting worse 6. AGGRAVATING FACTORS: "Does anything make it worse?" (e.g., standing, change in head position)     standing 7. HEART RATE: "Can you tell me your heart rate?" "How many beats in 15 seconds?"  (Note: not all patients can do this)       no 8. CAUSE: "What do you think is causing the dizziness?"     unsure 9. RECURRENT SYMPTOM: "Have you had dizziness before?" If Yes, ask: "When was the last time?" "What happened that time?"     no 10. OTHER SYMPTOMS: "Do you have any other symptoms?" (e.g., fever, chest pain, vomiting, diarrhea, bleeding)       Our of breath sometimes and heart rate will go up.  99.2 oral at the ED yesterday 11. PREGNANCY: "Is there any chance you are pregnant?" "When was your last menstrual period?"       na  Protocols used: DIZZINESS [facility]

## 2021-03-11 ENCOUNTER — Other Ambulatory Visit: Payer: Self-pay

## 2021-03-11 ENCOUNTER — Ambulatory Visit: Payer: Self-pay | Attending: Internal Medicine

## 2021-03-11 DIAGNOSIS — S90869D Insect bite (nonvenomous), unspecified foot, subsequent encounter: Secondary | ICD-10-CM

## 2021-03-11 DIAGNOSIS — D508 Other iron deficiency anemias: Secondary | ICD-10-CM

## 2021-03-11 DIAGNOSIS — E871 Hypo-osmolality and hyponatremia: Secondary | ICD-10-CM

## 2021-03-11 DIAGNOSIS — W57XXXD Bitten or stung by nonvenomous insect and other nonvenomous arthropods, subsequent encounter: Secondary | ICD-10-CM

## 2021-03-11 NOTE — Addendum Note (Signed)
Addended by: Jonah Blue B on: 03/11/2021 08:11 AM   Modules accepted: Orders

## 2021-03-11 NOTE — Telephone Encounter (Signed)
Called pt stated Jeremy [name] a has given this message and he has no questions or concerns.

## 2021-03-12 ENCOUNTER — Other Ambulatory Visit: Payer: Self-pay | Admitting: Internal Medicine

## 2021-03-13 MED ORDER — ROPINIROLE HCL 0.5 MG PO TABS
ORAL_TABLET | ORAL | 1 refills | Status: DC
  Filled 2021-03-13: qty 30, 30d supply, fill #0
  Filled 2021-04-19: qty 30, 30d supply, fill #1
  Filled 2021-05-19: qty 30, 30d supply, fill #2
  Filled 2021-06-13 – 2021-06-23 (×2): qty 30, 30d supply, fill #3
  Filled 2021-07-16: qty 30, 30d supply, fill #4
  Filled 2021-08-21: qty 30, 30d supply, fill #5

## 2021-03-13 NOTE — Telephone Encounter (Signed)
Requested Prescriptions  Pending Prescriptions Disp Refills  . rOPINIRole (REQUIP) 0.5 MG tablet 90 tablet 1    Sig: TAKE 1 TABLET (0.5 MG TOTAL) BY MOUTH AT BEDTIME.     Neurology:  Parkinsonian Agents Passed - 03/12/2021 10:12 PM      Passed - Last BP in normal range    BP Readings from Last 1 Encounters:  03/09/21 139/75         Passed - Valid encounter within last 12 months    Recent Outpatient Visits          2 months ago Preoperative evaluation to rule out surgical contraindication   [facility] Marcine Matar, MD   3 months ago Acute pain of right knee   [facility] Jonah Blue B, MD   6 months ago Type 2 diabetes mellitus with diabetic polyneuropathy, without long-term current use of insulin [facility])   [facility] Marcine Matar, MD   7 months ago Acute abdominal pain   [facility] Jonah Blue B, MD   9 months ago Type 2 diabetes mellitus with diabetic polyneuropathy, without long-term current use of insulin [facility])   [facility] Marcine Matar, MD

## 2021-03-15 ENCOUNTER — Encounter (INDEPENDENT_AMBULATORY_CARE_PROVIDER_SITE_OTHER): Payer: Self-pay

## 2021-03-15 ENCOUNTER — Encounter: Payer: Self-pay | Admitting: Physical Therapy

## 2021-03-15 ENCOUNTER — Other Ambulatory Visit: Payer: Self-pay

## 2021-03-15 LAB — BASIC METABOLIC PANEL
BUN/Creatinine Ratio: 15 (ref 9–20)
BUN: 15 mg/dL (ref 6–24)
CO2: 20 mmol/L (ref 20–29)
Calcium: 8.9 mg/dL (ref 8.7–10.2)
Chloride: 95 mmol/L — ABNORMAL LOW (ref 96–106)
Creatinine, Ser: 1.03 mg/dL (ref 0.76–1.27)
Glucose: 210 mg/dL — ABNORMAL HIGH (ref 65–99)
Potassium: 4.4 mmol/L (ref 3.5–5.2)
Sodium: 130 mmol/L — ABNORMAL LOW (ref 134–144)
eGFR: 85 mL/min/{1.73_m2} (ref >59)

## 2021-03-15 LAB — OSMOLALITY: Osmolality Meas: 274 mOsmol/kg — ABNORMAL LOW (ref 275–295)

## 2021-03-15 LAB — IRON,TIBC AND FERRITIN PANEL
Ferritin: 23 ng/mL — ABNORMAL LOW (ref 30–400)
Iron Saturation: 6 % — CL (ref 15–55)
Iron: 18 ug/dL — ABNORMAL LOW (ref 38–169)
Total Iron Binding Capacity: 307 ug/dL (ref 250–450)
UIBC: 289 ug/dL (ref 111–343)

## 2021-03-15 LAB — LYME DISEASE SEROLOGY W/REFLEX: Lyme Total Antibody EIA: NEGATIVE

## 2021-03-16 LAB — SODIUM, URINE, RANDOM: Sodium, Ur: 63 mmol/L

## 2021-03-16 LAB — OSMOLALITY, URINE: Osmolality, Ur: 460 mOsmol/kg

## 2021-03-17 ENCOUNTER — Ambulatory Visit: Payer: Self-pay | Attending: Internal Medicine | Admitting: Internal Medicine

## 2021-03-17 ENCOUNTER — Encounter: Payer: Self-pay | Admitting: Internal Medicine

## 2021-03-17 ENCOUNTER — Other Ambulatory Visit: Payer: Self-pay

## 2021-03-17 VITALS — BP 110/67 | HR 99 | Resp 16 | Wt 213.0 lb

## 2021-03-17 DIAGNOSIS — E871 Hypo-osmolality and hyponatremia: Secondary | ICD-10-CM

## 2021-03-17 DIAGNOSIS — Z980 Intestinal bypass and anastomosis status: Secondary | ICD-10-CM

## 2021-03-17 DIAGNOSIS — R6889 Other general symptoms and signs: Secondary | ICD-10-CM

## 2021-03-17 DIAGNOSIS — K5909 Other constipation: Secondary | ICD-10-CM

## 2021-03-17 DIAGNOSIS — R42 Dizziness and giddiness: Secondary | ICD-10-CM

## 2021-03-17 DIAGNOSIS — R634 Abnormal weight loss: Secondary | ICD-10-CM

## 2021-03-17 DIAGNOSIS — D509 Iron deficiency anemia, unspecified: Secondary | ICD-10-CM

## 2021-03-17 DIAGNOSIS — R0602 Shortness of breath: Secondary | ICD-10-CM

## 2021-03-17 MED ORDER — FERROUS SULFATE 325 (65 FE) MG PO TABS
325.0000 mg | ORAL_TABLET | Freq: Every day | ORAL | 1 refills | Status: DC
  Filled 2021-03-17: qty 100, 100d supply, fill #0
  Filled 2021-05-02: qty 100, 100d supply, fill #1

## 2021-03-17 MED ORDER — POLYETHYLENE GLYCOL 3350 17 GM/SCOOP PO POWD
17.0000 g | Freq: Every day | ORAL | 1 refills | Status: DC | PRN
  Filled 2021-03-17 – 2021-03-18 (×2): qty 510, 30d supply, fill #0

## 2021-03-17 NOTE — Progress Notes (Signed)
Patient ID: Jeremy [name], male    DOB: 1961/04/12  MRN: [id number]  CC: Nausea and Dizziness   Subjective: Jeremy [name] is a 60 y.o. male who presents for ER f/u His concerns today include:  Pt with hx of DM, HL, HTN,GERD,multivessel CAD  On CT chest,IDA(Barrett's esophagus and moderate gastritis thought to be NSAID induced on EGD),pyloric stenosis,chronic LBP, ED, OSA, RLS,  Patient presents as follow-up from the emergency room where he was seen on the 25th of last month with complaints of 7 to 10 days of generalized weakness, body aches, occasional headache and 1 episode of vomiting.  Blood pressure was 141/54 and temp was 99.  EKG without significant changes.  Found to be hyponatremic with sodium of 128 and glucose of 183.  He also was found to have a worsening anemia with H&H of 8.7/27.6.  Last reading prior to this was 2 months earlier with H/H of 10.8/32.9 to the hospital discharge for gastric bypass surgery.  Occult blood negative.  Patient was given fluids.  Prior to that ER visit, patient had sent a MyChart message of a tick bite on the dorsal surface of one of his foot.  The area was red suggesting erythema migrans.  I prescribed doxycycline.  After being on the medication for several days, patient complained of peeling of skin around his feet.  We had him stop the doxycycline with about 4 days left.  Since ER visit, I had him come to the laboratory to do some blood test including repeat sodium level which was at 130.  Serum osmolarity was 274.  Urine sodium and urine osmolality were supposed to be done on the same day but patient could not produce urine while he was here that day to have blood test done.  He came back 3 days later to give the urine.  Urine osmolality was 460, urine sodium was 63. Iron studies: Ferritin of 23, iron of 18, iron saturation of 6, total iron binding capacity of 307 Lyme total antibody EIA was negative. Patient was advised to restart iron  supplement.  He is up-to-date with colon cancer screening.  Today: He reports he is feeling a little better. Reports intermittent lightheadedness, fatigue and feeling cold over the past 2-1/2 weeks.  Positive constipation of hard stools which he states has been going on even before his bowel surgery in March.  Today however he had a good bowel movement.  No blood in the stools.  +Nausea at times. Endorses lightheadedness, shortness of breath and tachycardia sometimes when he first stands up.  He has to wait for a few minutes before he would start walking.  This does not happen all the time.  Denies any chest pains.  He had cardiology work-up prior to his abdominal surgery in March including a negative myoview and echo with good heart function.  Denies any PND or lower extremity edema.  No recent long distance traveling. -Reports compliance with taking iron supplement since we last communicated about it via MyChart last week. -He has been drinking 2-4 bottles of water and 2 bottles of 16 ounce Gatorade most days. -He self discontinued HCTZ 1 month ago because blood pressure was dropping too low post surgery.  Also stopped Norvasc about 4 days ago because he felt it was causing increased urination. -He has lost 24 pounds since his gastrojejunostomy surgery He wonders whether all of his symptoms are caused by the doxycycline that was stopped about 2 weeks ago  Patient Active  Problem List   Diagnosis Date Noted  . S/P bypass gastrojejunostomy 01/03/2021  . Gastric outlet obstruction   . Duodenal stricture   . Primary osteoarthritis of both hips 11/15/2020  . Aortic atherosclerosis ([place]) 08/30/2020  . Coronary artery calcification 08/30/2020  . Bilateral carpal tunnel syndrome 06/07/2020  . Grief reaction 06/07/2020  . 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 Congenital hypertrophic pyloric stenosis   . Duodenal web   . Barrett's esophagus without dysplasia 05/19/2019  . OSA (obstructive sleep  apnea) 04/30/2019  . Iron deficiency anemia 11/25/2018  . Phimosis of penis 11/19/2018  . Esophageal dysphagia 08/19/2018  . Benign prostatic hyperplasia with post-void dribbling 01/31/2018  . Absence of bladder continence 01/31/2018  . Obesity (BMI 30-39.9) 11/05/2017  . GERD (gastroesophageal reflux disease) 06/03/2017  . Chronic pain of both knees 06/03/2017  . Chronic lower back pain 06/03/2017  . Type 2 diabetes mellitus without complication, without long-term current use of insulin ([place]) 04/12/2016  . Essential hypertension 04/12/2016     Current Outpatient Medications on File Prior to Visit  Medication Sig Dispense Refill  . acetaminophen (TYLENOL) 500 MG tablet Take 1,000 mg by mouth in the morning and at bedtime.     Marland Kitchen amLODipine (NORVASC) 10 MG tablet TAKE 1 TABLET (10 MG TOTAL) BY MOUTH DAILY. (Patient not taking: Reported on 03/17/2021) 90 tablet 3  . atorvastatin (LIPITOR) 80 MG tablet TAKE 1 TABLET (80 MG TOTAL) BY MOUTH DAILY. 90 tablet 2  . Blood Glucose Monitoring Suppl (TRUE METRIX METER) w/Device KIT 1 each by Does not apply route 4 (four) times daily -  before meals and at bedtime. 1 kit 0  . cyclobenzaprine (FLEXERIL) 10 MG tablet Take 1 tablet (10 mg total) by mouth 3 (three) times daily as needed for muscle spasms. 60 tablet 3  . glipiZIDE (GLUCOTROL) 5 MG tablet TAKE 1 TABLET BY MOUTH IN THE MORNINGS AND 1 TABLET IN THE EVENINGS. 60 tablet 1  . glucose blood test strip Use as instructed 100 each 12  . lisinopril (ZESTRIL) 40 MG tablet TAKE 1 TABLET (40 MG TOTAL) BY MOUTH DAILY. 90 tablet 2  . loratadine (CLARITIN) 10 MG tablet Take 10 mg by mouth daily.    . naproxen sodium (ALEVE) 220 MG tablet Take 220 mg by mouth in the morning and at bedtime.    . ondansetron (ZOFRAN-ODT) 4 MG disintegrating tablet TAKE 1 TABLET (4 MG TOTAL) BY MOUTH EVERY 8 (EIGHT) HOURS AS NEEDED FOR UP TO 2 DAYS FOR NAUSEA OR VOMITING. 6 tablet 0  . pantoprazole (PROTONIX) 40 MG tablet TAKE 1  TABLET (40 MG TOTAL) BY MOUTH 2 (TWO) TIMES DAILY BEFORE A MEAL. 60 tablet 3  . Potassium 99 MG TABS Take 99 mg by mouth every evening.    Marland Kitchen rOPINIRole (REQUIP) 0.5 MG tablet TAKE 1 TABLET (0.5 MG TOTAL) BY MOUTH AT BEDTIME. 90 tablet 1  . sildenafil (VIAGRA) 50 MG tablet 1 TAB 1/2 HR PRIOR TO SEX. LIMIT USE TO 1 TAB/24 HRS 10 tablet 3  . tamsulosin (FLOMAX) 0.4 MG CAPS capsule Take 1 capsule (0.4 mg total) by mouth daily. 30 capsule 5  . traMADol (ULTRAM) 50 MG tablet Take 1 tablet (50 mg total) by mouth every 12 (twelve) hours as needed. 60 tablet 0  . TRUEplus Lancets 28G MISC CHECK BLOOD SUGAR FASTING AND BEFORE MEALS AND AGAIN IF PT FEELS BAD (SYMPTOMS OF HYPO). 100 each 12   No current facility-administered medications on file prior  to visit.    Allergies  Allergen Reactions  . Cymbalta [Duloxetine Hcl] Other (See Comments)    Caused anorgasmia  . Doxycycline   . Other Nausea And Vomiting    Mayonnaise mustard ketchup  . Robaxin [Methocarbamol] Other (See Comments)    Had hiccups x 4 hours after taking    Social History   Socioeconomic History  . Marital status: Significant Other    Spouse name: Not on file  . Number of children: Not on file  . Years of education: Not on file  . Highest education level: Not on file  Occupational History  . Occupation: Clinical biochemist  Tobacco Use  . Smoking status: Former Smoker    Quit date: 10/16/1988    Years since quitting: 32.4  . Smokeless tobacco: Never Used  Vaping Use  . Vaping Use: Never used  Substance and Sexual Activity  . Alcohol use: No  . Drug use: No  . Sexual activity: Not on file  Other Topics Concern  . Not on file  Social History Narrative   Volunteers as Airline pilot   Social Determinants of Radio broadcast assistant Strain: Not on file  Food Insecurity: Not on file  Transportation Needs: Not on file  Physical Activity: Not on file  Stress: Not on file  Social Connections: Not on file  Intimate  Partner Violence: Not on file    Family History  Problem Relation Age of Onset  . Pancreatic cancer Mother   . CAD Father   . Colon cancer Paternal Grandfather     Past Surgical History:  Procedure Laterality Date  . BACK SURGERY  1993   lumbar  . BIOPSY  02/07/2019   Procedure: BIOPSY;  Surgeon: Danie Binder, MD;  Location: [facility];  Service: Endoscopy;;  . CERVICAL SPINE SURGERY  2000  . COLONOSCOPY N/A 02/07/2019   Dr. Oneida Alar: External hemorrhoids next colonoscopy in 10 years  . ESOPHAGOGASTRODUODENOSCOPY N/A 02/07/2019   Dr. Oneida Alar: Barrett's esophagus without dysplasia chronic inactive gastritis but no H. 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 ESOPHAGOGASTRODUODENOSCOPY N/A 01/05/2020   Procedure: ESOPHAGOGASTRODUODENOSCOPY (EGD);  Surgeon: Danie Binder, MD;  Location: [facility];  Service: Endoscopy;  Laterality: N/A;  10:30am  . ESOPHAGOGASTRODUODENOSCOPY (EGD) WITH PROPOFOL N/A 12/06/2020   Procedure: ESOPHAGOGASTRODUODENOSCOPY (EGD) WITH PROPOFOL;  Surgeon: Eloise Harman, DO;  Location: [facility];  Service: Endoscopy;  Laterality: N/A;  11:45am  . GASTROJEJUNOSTOMY N/A 01/03/2021   Procedure: GASTROJEJUNOSTOMY;  Surgeon: Aviva Signs, MD;  Location: [facility];  Service: General;  Laterality: N/A;  . HAND SURGERY    . SAVORY DILATION N/A 01/05/2020   Procedure: SAVORY DILATION;  Surgeon: Danie Binder, MD;  Location: [facility];  Service: Endoscopy;  Laterality: N/A;    ROS: Review of Systems Negative except as stated above  PHYSICAL EXAM: BP 110/67   Pulse 99   Resp 16   Wt 213 lb (96.6 kg)   SpO2 98%   BMI 30.56 kg/m   Wt Readings from Last 3 Encounters:  03/17/21 213 lb (96.6 kg)  03/09/21 216 lb (98 kg)  03/02/21 212 lb 12.8 oz (96.5 kg)  Sitting: BP 128/71, pulse  95 Standing: 109/66, pulse 100 Pt did not complain of any dizziness or  lightheadedness from sitting to standing. Physical Exam  General appearance - alert, well appearing, and in no distress.  Patient with  some noted weight loss. Mental status - normal mood, behavior, speech, dress, motor activity, and thought processes Eyes -pale conjunctiva Ears - bilateral TM's and external ear canals normal Mouth - mucous membranes moist, pharynx normal without lesions Neck - supple, no significant adenopathy Lymphatics - no palpable lymphadenopathy, no hepatosplenomegaly Chest - clear to auscultation, no wheezes, rales or rhonchi, symmetric air entry Heart - normal rate, regular rhythm, normal S1, S2, no murmurs, rubs, clicks or gallops Abdomen - soft, nontender, nondistended, no masses or organomegaly Neurological cranial nerves grossly intact.  Actively moving all fours with good power in the extremities.  Gait is stable. Extremities -no lower extremity edema   CMP Latest Ref Rng & Units 03/11/2021 03/09/2021 01/05/2021  Glucose 65 - 99 mg/dL 210(H) 183(H) 107(H)  BUN 6 - 24 mg/dL 15 15 15   Creatinine 0.76 - 1.27 mg/dL 1.03 0.86 0.72  Sodium 134 - 144 mmol/L 130(L) 128(L) 135  Potassium 3.5 - 5.2 mmol/L 4.4 4.1 3.6  Chloride 96 - 106 mmol/L 95(L) 98 103  CO2 20 - 29 mmol/L 20 22 23   Calcium 8.7 - 10.2 mg/dL 8.9 8.4(L) 8.6(L)  Total Protein 6.5 - 8.1 g/dL - 6.8 -  Total Bilirubin 0.3 - 1.2 mg/dL - 0.3 -  Alkaline Phos 38 - 126 U/L - 73 -  AST 15 - 41 U/L - 16 -  ALT 0 - 44 U/L - 20 -   Lipid Panel     Component Value Date/Time   CHOL 150 02/05/2020 1442   TRIG 168 (H) 02/05/2020 1442   HDL 40 02/05/2020 1442   CHOLHDL 3.8 02/05/2020 1442   CHOLHDL 4.8 10/17/2016 1012   VLDL NOT CALC 10/17/2016 1012   LDLCALC 81 02/05/2020 1442    CBC    Component Value Date/Time   WBC 10.0 03/09/2021 2008   RBC 3.23 (L) 03/09/2021 2008   HGB 8.7 (L) 03/09/2021 2008   HGB 13.4 09/20/2020 1642   HCT 27.6 (L) 03/09/2021 2008   HCT 39.2 09/20/2020 1642   PLT 304  03/09/2021 2008   PLT 198 09/20/2020 1642   MCV 85.4 03/09/2021 2008   MCV 86 09/20/2020 1642   MCH 26.9 03/09/2021 2008   MCHC 31.5 03/09/2021 2008   RDW 13.1 03/09/2021 2008   RDW 12.5 09/20/2020 1642   LYMPHSABS 1.4 03/09/2021 2008   LYMPHSABS 2.0 08/13/2019 1617   MONOABS 0.9 03/09/2021 2008   EOSABS 0.1 03/09/2021 2008   EOSABS 0.4 08/13/2019 1617   BASOSABS 0.1 03/09/2021 2008   BASOSABS 0.1 08/13/2019 1617    ASSESSMENT AND PLAN: 1. Dizziness Patient almost orthostatic based on blood pressure reading.  However he appears hydrated on exam.  Differential diagnosis include worsening anemia, overmedication with blood pressure medicine in the face of weight loss, adrenal insufficiency, SIADH -Advised to cut lisinopril 40 mg in half which will be 20 mg daily. - CBC; Future  2. Hyponatremia Patient with hypoosmolar hyponatremia.  Urine sodium and osmolality even though not drawn at the time of is a chemistry suggest possible SIADH. Differential diagnosis include hypothyroid, adrenal insufficiency, SIADH.  We will do further work-up. - Comprehensive metabolic panel; Future - Cortisol - AM; Future - Osmolality; Future - Sodium, urine, random; Future - Osmolality, urine; Future - ACTH; Future  3. Iron deficiency anemia, unspecified iron deficiency anemia type Continue iron supplement.  Recheck CBC - ferrous sulfate 325 (65 FE) MG tablet; Take 1 tablet (325 mg total) by mouth daily with breakfast.  Dispense:  100 tablet; Refill: 1 - CBC; Future  4. 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 - TSH+T4F+T3Free; Future  5. Status post bypass gastrojejunostomy Advised patient to take a multivitamin tablet daily - Vitamin B12; Future - VITAMIN D 25 Hydroxy (Vit-D Deficiency, Fractures); Future  6. Shortness of breath Could be due to anemia.  7. Cold intolerance - TSH+T4F+T3Free; Future  8. Other constipation - polyethylene  glycol powder (GLYCOLAX/MIRALAX) 17 GM/SCOOP powder; Take 17 g by mouth daily as needed.  Dispense: 3350 g; Refill: 1     Patient was given the opportunity to ask questions.  Patient verbalized understanding of the plan and was able to repeat key elements of the plan.   No orders of the defined types were placed in this encounter.    Requested Prescriptions    No prescriptions requested or ordered in this encounter    No follow-ups on file.  Karle Plumber, MD, FACP

## 2021-03-17 NOTE — Patient Instructions (Addendum)
Stop amlodipine. Decrease lisinopril to 20 mg daily. Please come to the lab tomorrow to have blood test done and urine test done. Please go slow with position changes.  Take the iron supplement once a day.  I have sent a refill to the pharmacy. Take the MiraLAX daily as needed to help decrease constipation while on iron.

## 2021-03-17 NOTE — Addendum Note (Signed)
Addended by: Jonah Blue B on: 03/17/2021 08:29 AM   Modules accepted: Orders

## 2021-03-18 ENCOUNTER — Other Ambulatory Visit: Payer: Self-pay

## 2021-03-18 ENCOUNTER — Ambulatory Visit: Payer: Self-pay | Attending: Internal Medicine

## 2021-03-18 DIAGNOSIS — D509 Iron deficiency anemia, unspecified: Secondary | ICD-10-CM

## 2021-03-18 DIAGNOSIS — R634 Abnormal weight loss: Secondary | ICD-10-CM

## 2021-03-18 DIAGNOSIS — R6889 Other general symptoms and signs: Secondary | ICD-10-CM

## 2021-03-18 DIAGNOSIS — E871 Hypo-osmolality and hyponatremia: Secondary | ICD-10-CM

## 2021-03-18 DIAGNOSIS — R42 Dizziness and giddiness: Secondary | ICD-10-CM

## 2021-03-18 DIAGNOSIS — Z980 Intestinal bypass and anastomosis status: Secondary | ICD-10-CM

## 2021-03-19 ENCOUNTER — Encounter: Payer: Self-pay | Admitting: Internal Medicine

## 2021-03-19 ENCOUNTER — Other Ambulatory Visit: Payer: Self-pay | Admitting: Internal Medicine

## 2021-03-19 DIAGNOSIS — E871 Hypo-osmolality and hyponatremia: Secondary | ICD-10-CM

## 2021-03-19 DIAGNOSIS — E222 Syndrome of inappropriate secretion of antidiuretic hormone: Secondary | ICD-10-CM

## 2021-03-19 DIAGNOSIS — E559 Vitamin D deficiency, unspecified: Secondary | ICD-10-CM

## 2021-03-19 DIAGNOSIS — E538 Deficiency of other specified B group vitamins: Secondary | ICD-10-CM

## 2021-03-19 DIAGNOSIS — E611 Iron deficiency: Secondary | ICD-10-CM

## 2021-03-19 LAB — SPECIMEN STATUS REPORT

## 2021-03-19 LAB — TSH: TSH: 1.37 u[IU]/mL (ref 0.450–4.500)

## 2021-03-19 LAB — CORTISOL: Cortisol: 19.7 ug/dL

## 2021-03-21 ENCOUNTER — Encounter: Payer: Self-pay | Admitting: *Deleted

## 2021-03-21 LAB — COMPREHENSIVE METABOLIC PANEL
ALT: 16 IU/L (ref 0–44)
AST: 18 IU/L (ref 0–40)
Albumin/Globulin Ratio: 1.6 (ref 1.2–2.2)
Albumin: 3.9 g/dL (ref 3.8–4.9)
Alkaline Phosphatase: 87 IU/L (ref 44–121)
BUN/Creatinine Ratio: 16 (ref 9–20)
BUN: 13 mg/dL (ref 6–24)
Bilirubin Total: 0.3 mg/dL (ref 0.0–1.2)
CO2: 21 mmol/L (ref 20–29)
Calcium: 8.9 mg/dL (ref 8.7–10.2)
Chloride: 98 mmol/L (ref 96–106)
Creatinine, Ser: 0.82 mg/dL (ref 0.76–1.27)
Globulin, Total: 2.5 g/dL (ref 1.5–4.5)
Glucose: 82 mg/dL (ref 65–99)
Potassium: 4.1 mmol/L (ref 3.5–5.2)
Sodium: 133 mmol/L — ABNORMAL LOW (ref 134–144)
Total Protein: 6.4 g/dL (ref 6.0–8.5)
eGFR: 102 mL/min/{1.73_m2} (ref >59)

## 2021-03-21 LAB — CBC
Hematocrit: 25.9 % — ABNORMAL LOW (ref 37.5–51.0)
Hemoglobin: 8.2 g/dL — ABNORMAL LOW (ref 13.0–17.7)
MCH: 26.5 pg — ABNORMAL LOW (ref 26.6–33.0)
MCHC: 31.7 g/dL (ref 31.5–35.7)
MCV: 84 fL (ref 79–97)
Platelets: 296 10*3/uL (ref 150–450)
RBC: 3.1 x10E6/uL — ABNORMAL LOW (ref 4.14–5.80)
RDW: 12.9 % (ref 11.6–15.4)
WBC: 5.4 10*3/uL (ref 3.4–10.8)

## 2021-03-21 LAB — TSH+T4F+T3FREE
Free T4: 1.18 ng/dL (ref 0.82–1.77)
T3, Free: 3.1 pg/mL (ref 2.0–4.4)
TSH: 1.97 u[IU]/mL (ref 0.450–4.500)

## 2021-03-21 LAB — ACTH: ACTH: 48.9 pg/mL (ref 7.2–63.3)

## 2021-03-21 LAB — OSMOLALITY, URINE: Osmolality, Ur: 371 mOsmol/kg

## 2021-03-21 LAB — OSMOLALITY: Osmolality Meas: 272 mOsmol/kg — ABNORMAL LOW (ref 275–295)

## 2021-03-21 LAB — VITAMIN B12: Vitamin B-12: 355 pg/mL (ref 232–1245)

## 2021-03-21 LAB — SODIUM, URINE, RANDOM: Sodium, Ur: 50 mmol/L

## 2021-03-21 LAB — VITAMIN D 25 HYDROXY (VIT D DEFICIENCY, FRACTURES): Vit D, 25-Hydroxy: 22.5 ng/mL — ABNORMAL LOW (ref 30.0–100.0)

## 2021-03-22 ENCOUNTER — Telehealth: Payer: Self-pay | Admitting: Physician Assistant

## 2021-03-22 NOTE — Telephone Encounter (Signed)
Scheduled appt per 6/4 referral. Pt aware.  

## 2021-03-23 ENCOUNTER — Other Ambulatory Visit: Payer: Self-pay

## 2021-03-23 DIAGNOSIS — E559 Vitamin D deficiency, unspecified: Secondary | ICD-10-CM | POA: Insufficient documentation

## 2021-03-23 DIAGNOSIS — E538 Deficiency of other specified B group vitamins: Secondary | ICD-10-CM | POA: Insufficient documentation

## 2021-03-23 MED ORDER — CYANOCOBALAMIN 500 MCG PO TABS
500.0000 ug | ORAL_TABLET | Freq: Every day | ORAL | 1 refills | Status: DC
  Filled 2021-03-23: qty 90, 90d supply, fill #0

## 2021-03-23 NOTE — Addendum Note (Signed)
Addended by: Jonah Blue B on: 03/23/2021 09:58 PM   Modules accepted: Orders

## 2021-03-27 NOTE — Progress Notes (Addendum)
[place] [phone]   [phone]  CONSULT NOTE  REFERRING PHYSICIAN: Dr. Wynetta Emery   REASON FOR CONSULTATION:  Iron Deficiency Anemia  HPI Jeremy [name] is a 60 y.o. male status post S/P bypass gastrojejunostomy on 01/05/21, hypertension, coronary artery disease, obstructive sleep apnea, GERD, esophageal dysmotility, Barrett's esophagitis, murmur, pyloric stenosis, diabetes mellitus, and osteoarthritis is referred to the clinic for evaluation of iron deficiency anemia.   The patient states he had mild anemia about 1-2 years ago. The hemoglobins at this time were ~11-12. He had an upper endoscopy and colonoscopy which did not show any source of bleeding but showed some stenosis at the pylorus. They tried to dilate a few areas of stenosis without success.  In February 2022, the patient had a repeat endoscopy which showed duodenal stenosis. He was referred to Dr. Adline Mango, general surgeon, who performed bypass gastrojejunostomy on 01/05/21.    Since his surgery, he has been having worsening anemia. His Hgb on 12/31/20 was 11.5. 6 months ago, his Hbg was normal at 13.4.   The patient was referred by his primary care provider. The patient had a follow-up visit with his primary care provider on 03/17/2021 for a emergency room follow-up visit.  He been having worsening anemia for the last 2 to 3 months. He had occult blood testing performed in the ER which was negative.  His CBC from that day showed a hemoglobin of 8.2, and MCV at 84 and a normal white blood cell count and platelet count.  His vitamin B12 was within normal limits at 355. She placed him on a B12 supplement. His iron studies from 03/11/2021 showed an iron low at 18, and iron study low at 6%, and a ferritin low at 23. He was placed on a prescription iron which he has been taking BID for the last 2 weeks. He has been tolerating this better compared to his other iron supplement which caused "dark sticky  stools" that required the patient to need to shower after a bowel movement. He was subsequently referred to the clinic today for further evaluation management of this finding.   The patient denies any other history of any other vitamin deficiencies.  His last colonoscopy was on 02/06/2021 which was benign except for some external hemorrhoids. He denies any NSAID use. He denies any abnormal bleeding inclinding epistaxis, gingival bleeding, hemoptysis, hematemesis, melena, or hematuria. He notes bruising if he hits his extremity on something. His fatigue actually reportedly improving despite worsening anemia. He had some lightheadedness related to hypotension which also has improved with improvement of his BP management. He reports dyspnea on exertion which is also improving and may have predated his anemia. He denies any fever, chills, or night sweats. He notes increased cold intolerance. He has lost weight since his surgery. He denies any ice cravings. He is not taking aspirin or a blood thinner. He reports diarrhea the last two "weekends" in a row. His last bowel movement as yesterday. Denies blood in the stool. He had a "hunger pain" in his abdomen which resolved with peptobismol.   His family history consists of a mother who had pancreatic cancer. His father had hypokalemia. His brother had lung disease. His other brother passed away secondary to [place]. He had a daughter who passed away secondary to lung complications from rheumatoid arthritis.   The patient works on Medical sales representative, IT trainer, and plumbing in houses. He has two living children. He is a former smoker having smoked about  1/2 a pack of cigarettes for 17 years. He denies street drug use. Denies alcohol use.    HPI  Past Medical History:  Diagnosis Date   Diabetes mellitus without complication (HCC)    GERD (gastroesophageal reflux disease)    Hyperlipidemia    Hypertension    IDA (iron deficiency anemia)    Rash of entire body 03/2016    Sleep apnea     Past Surgical History:  Procedure Laterality Date   BACK SURGERY  1993   lumbar   BIOPSY  02/07/2019   Procedure: BIOPSY;  Surgeon: Danie Binder, MD;  [facility];  Service: Endoscopy;;   CERVICAL SPINE SURGERY  2000   COLONOSCOPY N/A 02/07/2019   Dr. Oneida Alar: External hemorrhoids next colonoscopy in 10 years   ESOPHAGOGASTRODUODENOSCOPY N/A 02/07/2019   Dr. Oneida Alar: Barrett's esophagus without dysplasia chronic inactive gastritis but no H. 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, nonbleeding duodenal diverticulum,   ESOPHAGOGASTRODUODENOSCOPY N/A 01/05/2020   Procedure: ESOPHAGOGASTRODUODENOSCOPY (EGD);  Surgeon: Danie Binder, MD;  [facility];  Service: Endoscopy;  Laterality: N/A;  10:30am   ESOPHAGOGASTRODUODENOSCOPY (EGD) WITH PROPOFOL N/A 12/06/2020   Procedure: ESOPHAGOGASTRODUODENOSCOPY (EGD) WITH PROPOFOL;  Surgeon: Eloise Harman, DO;  [facility];  Service: Endoscopy;  Laterality: N/A;  11:45am   GASTROJEJUNOSTOMY N/A 01/03/2021   Procedure: GASTROJEJUNOSTOMY;  Surgeon: Aviva Signs, MD;  Location: [facility];  Service: General;  Laterality: N/A;   HAND SURGERY     SAVORY DILATION N/A 01/05/2020   Procedure: SAVORY DILATION;  Surgeon: Danie Binder, MD;  [facility];  Service: Endoscopy;  Laterality: N/A;    Family History  Problem Relation Age of Onset   Pancreatic cancer Mother    CAD Father    Colon cancer Paternal Grandfather     Social History Social History   Tobacco Use   Smoking status: Former    Pack years: 0.00    Types: Cigarettes    Quit date: 10/16/1988    Years since quitting: 32.4   Smokeless tobacco: Never  Vaping Use   Vaping Use: Never used  Substance Use Topics   Alcohol use: No   Drug use: No    Allergies  Allergen Reactions   Cymbalta [Duloxetine Hcl] Other (See Comments)    Caused anorgasmia   Doxycycline    Other  Nausea And Vomiting    Mayonnaise mustard ketchup   Robaxin [Methocarbamol] Other (See Comments)    Had hiccups x 4 hours after taking    Current Outpatient Medications  Medication Sig Dispense Refill   acetaminophen (TYLENOL) 500 MG tablet Take 1,000 mg by mouth in the morning and at bedtime.      atorvastatin (LIPITOR) 80 MG tablet TAKE 1 TABLET (80 MG TOTAL) BY MOUTH DAILY. 90 tablet 2   Blood Glucose Monitoring Suppl (TRUE METRIX METER) w/Device KIT 1 each by Does not apply route 4 (four) times daily -  before meals and at bedtime. 1 kit 0   cyclobenzaprine (FLEXERIL) 10 MG tablet Take 1 tablet (10 mg total) by mouth 3 (three) times daily as needed for muscle spasms. 60 tablet 3   ferrous sulfate 325 (65 FE) MG tablet Take 1 tablet (325 mg total) by mouth daily with breakfast. 100 tablet 1   glipiZIDE (GLUCOTROL) 5 MG tablet TAKE 1 TABLET BY MOUTH IN THE MORNINGS AND 1 TABLET IN THE EVENINGS. 60 tablet  1   glucose blood test strip Use as instructed 100 each 12   lisinopril (ZESTRIL) 40 MG tablet TAKE 1 TABLET (40 MG TOTAL) BY MOUTH DAILY. 90 tablet 2   loratadine (CLARITIN) 10 MG tablet Take 10 mg by mouth daily.     naproxen sodium (ALEVE) 220 MG tablet Take 220 mg by mouth in the morning and at bedtime.     ondansetron (ZOFRAN-ODT) 4 MG disintegrating tablet TAKE 1 TABLET (4 MG TOTAL) BY MOUTH EVERY 8 (EIGHT) HOURS AS NEEDED FOR UP TO 2 DAYS FOR NAUSEA OR VOMITING. 6 tablet 0   pantoprazole (PROTONIX) 40 MG tablet TAKE 1 TABLET (40 MG TOTAL) BY MOUTH 2 (TWO) TIMES DAILY BEFORE A MEAL. 60 tablet 3   polyethylene glycol powder (GLYCOLAX/MIRALAX) 17 GM/SCOOP powder Take 17 g by mouth daily as needed. 510 g 1   Potassium 99 MG TABS Take 99 mg by mouth every evening.     rOPINIRole (REQUIP) 0.5 MG tablet TAKE 1 TABLET (0.5 MG TOTAL) BY MOUTH AT BEDTIME. 90 tablet 1   sildenafil (VIAGRA) 50 MG tablet 1 TAB 1/2 HR PRIOR TO SEX. LIMIT USE TO 1 TAB/24 HRS 10 tablet 3   tamsulosin (FLOMAX) 0.4 MG  CAPS capsule Take 1 capsule (0.4 mg total) by mouth daily. 30 capsule 5   traMADol (ULTRAM) 50 MG tablet Take 1 tablet (50 mg total) by mouth every 12 (twelve) hours as needed. 60 tablet 0   TRUEplus Lancets 28G MISC CHECK BLOOD SUGAR FASTING AND BEFORE MEALS AND AGAIN IF PT FEELS BAD (SYMPTOMS OF HYPO). 100 each 12   vitamin B-12 (CYANOCOBALAMIN) 500 MCG tablet Take 1 tablet (500 mcg total) by mouth daily. 90 tablet 1   No current facility-administered medications for this visit.    REVIEW OF SYSTEMS:   Review of Systems  Constitutional: Positive for fatigue and weight loss. Negative for chills and fever.  HENT: Negative for mouth sores, nosebleeds, sore throat and trouble swallowing.   Eyes: Negative for eye problems and icterus.  Respiratory: Positive for dyspnea on exertion. Negative for cough, hemoptysis, and wheezing.   Cardiovascular: Negative for chest pain and leg swelling.  Gastrointestinal: Negative for abdominal pain (resolved), constipation, diarrhea (resolved), nausea and vomiting.  Genitourinary: Negative for bladder incontinence, difficulty urinating, dysuria, frequency and hematuria.   Musculoskeletal: Negative for back pain, gait problem, neck pain and neck stiffness.  Skin: Negative for itching and rash.  Neurological: Negative for dizziness, extremity weakness, gait problem, headaches, light-headedness and seizures.  Hematological: Negative for adenopathy. Does not bruise/bleed easily.  Psychiatric/Behavioral: Negative for confusion, depression and sleep disturbance. The patient is not nervous/anxious.     PHYSICAL EXAMINATION:  There were no vitals taken for this visit.  ECOG PERFORMANCE STATUS: 1  Physical Exam  Constitutional: Oriented to person, place, and time and well-developed, well-nourished, and in no distress.  HENT:  Head: Normocephalic and atraumatic.  Mouth/Throat: Oropharynx is clear and moist. No oropharyngeal exudate.  Eyes: Conjunctivae are  normal. Right eye exhibits no discharge. Left eye exhibits no discharge. No scleral icterus.  Neck: Normal range of motion. Neck supple.  Cardiovascular: Normal rate, regular rhythm, normal heart sounds and intact distal pulses.   Pulmonary/Chest: Effort normal and breath sounds normal. No respiratory distress. No wheezes. No rales.  Abdominal: Soft. Bowel sounds are normal. Exhibits no distension and no mass. There is no tenderness.  Musculoskeletal: Normal range of motion. Exhibits no edema.  Lymphadenopathy:    No cervical adenopathy.  Neurological: Alert and oriented to person, place, and time. Exhibits normal muscle tone. Gait normal. Coordination normal.  Skin: Skin is warm and dry. No rash noted. Not diaphoretic. No erythema. No pallor.  Psychiatric: Mood, memory and judgment normal.  Vitals reviewed.  LABORATORY DATA: Lab Results  Component Value Date   WBC 5.4 03/18/2021   HGB 8.2 (L) 03/18/2021   HCT 25.9 (L) 03/18/2021   MCV 84 03/18/2021   PLT 296 03/18/2021      Chemistry      Component Value Date/Time   NA 133 (L) 03/18/2021 0945   K 4.1 03/18/2021 0945   CL 98 03/18/2021 0945   CO2 21 03/18/2021 0945   BUN 13 03/18/2021 0945   CREATININE 0.82 03/18/2021 0945   CREATININE 0.72 10/17/2016 1012      Component Value Date/Time   CALCIUM 8.9 03/18/2021 0945   ALKPHOS 87 03/18/2021 0945   AST 18 03/18/2021 0945   ALT 16 03/18/2021 0945   BILITOT 0.3 03/18/2021 0945       RADIOGRAPHIC STUDIES: No results found.  ASSESSMENT: This is a very pleasant 60 year old Caucasian male with iron deficiency anemia s/p S/P bypass gastrojejunostomy on 01/05/21    PLAN: The patient was seen with Dr. Julien Nordmann today. The patient had repeat lab studies performed including a CBC, CMP, iron studies, ferritin, SPEP, and folate.  The patient's labs today show continued anemia with a Hbg of 8.1. His iron studies are improving since starting on iron supplements. .   Dr. Julien Nordmann  recommends that the patient proceed with iron infusions with venofer 300 mg IV weekly for 3 weeks.    We will see him back for a follow up visit in 6 weeks for evaluation and repeat iron studies.   We will not perform occult blood testing on his stool at this time. However, if no improvement after IV iron infusions, we may consider testing this at his next visit. He had this performed a few weeks ago in the ER which was negative.  Recommend that the patient continue taking iron supplements daily with vitamin C. He was also given a handout on rich food.    The patient voices understanding of current disease status and treatment options and is in agreement with the current care plan.  All questions were answered. The patient knows to call the clinic with any problems, questions or concerns. We can certainly see the patient much sooner if necessary.  Thank you so much for allowing me to participate in the care of Jeremy [name]. I will continue to follow up the patient with you and assist in his care.    Disclaimer: This note was dictated with voice recognition software. Similar sounding words can inadvertently be transcribed and may not be corrected upon review.   Jeremy [name] March 27, 2021, 5:00 PM  ADDENDUM: Hematology/Oncology Attending: I had a face-to-face encounter with the patient today.  I reviewed his record, labs and recommended his care plan.  This is a very pleasant 60 years old white male with past medical history significant for multiple medical problems including coronary artery disease, obstructive sleep apnea, GERD, esophageal dysmotility and Barrett's esophagus as well as pyloric stenosis, diabetes mellitus, hypertension and osteoarthritis.  The patient was referred to our clinic today for evaluation of persistent iron deficiency anemia.  He mentioned that he had gastrointestinal blood work less than 2 years ago and they were unremarkable.  He also had repeat EGD  in  February 2022 which showed the duodenal stenosis.  The patient was referred for surgical evaluation and he underwent bypass and gastrojejunostomy on January 05, 2021.  Since that time he has significant drop in his hemoglobin and hematocrit recently down to 8.2. His most recent iron study on Mar 11, 2021 showed evidence for iron deficiency anemia. When seen today repeat CBC showed hemoglobin of 8.1 and hematocrit 26.3%.  Iron study today is unremarkable and showed normal serum iron of 69 with iron saturation of 22%.  Ferritin level was still low at 7. I recommended for the patient to proceed with iron infusion with Venofer 300 mg IV weekly for 3 weeks. The patient will come back for follow-up visit in 6 weeks for evaluation and repeat blood work. He was advised to call immediately if he has any other concerning symptoms in the interval.  The total time spent in the appointment was 60 minutes. Disclaimer: This note was dictated with voice recognition software. Similar sounding words can inadvertently be transcribed and may be missed upon review. Eilleen Kempf, MD 03/28/21

## 2021-03-28 ENCOUNTER — Other Ambulatory Visit: Payer: Self-pay

## 2021-03-28 ENCOUNTER — Inpatient Hospital Stay: Payer: Self-pay | Attending: Physician Assistant

## 2021-03-28 ENCOUNTER — Other Ambulatory Visit: Payer: Self-pay | Admitting: Physician Assistant

## 2021-03-28 ENCOUNTER — Inpatient Hospital Stay (HOSPITAL_BASED_OUTPATIENT_CLINIC_OR_DEPARTMENT_OTHER): Payer: Self-pay | Admitting: Physician Assistant

## 2021-03-28 VITALS — BP 145/59 | HR 88 | Temp 98.5°F | Resp 18 | Wt 215.7 lb

## 2021-03-28 DIAGNOSIS — D509 Iron deficiency anemia, unspecified: Secondary | ICD-10-CM

## 2021-03-28 LAB — IRON AND TIBC
Iron: 69 ug/dL (ref 42–163)
Saturation Ratios: 22 % (ref 20–55)
TIBC: 318 ug/dL (ref 202–409)
UIBC: 249 ug/dL (ref 117–376)

## 2021-03-28 LAB — CBC WITH DIFFERENTIAL (CANCER CENTER ONLY)
Abs Immature Granulocytes: 0.13 10*3/uL — ABNORMAL HIGH (ref 0.00–0.07)
Basophils Absolute: 0.1 10*3/uL (ref 0.0–0.1)
Basophils Relative: 1 %
Eosinophils Absolute: 0.2 10*3/uL (ref 0.0–0.5)
Eosinophils Relative: 3 %
HCT: 26.3 % — ABNORMAL LOW (ref 39.0–52.0)
Hemoglobin: 8.1 g/dL — ABNORMAL LOW (ref 13.0–17.0)
Immature Granulocytes: 2 %
Lymphocytes Relative: 20 %
Lymphs Abs: 1.6 10*3/uL (ref 0.7–4.0)
MCH: 26.8 pg (ref 26.0–34.0)
MCHC: 30.8 g/dL (ref 30.0–36.0)
MCV: 87.1 fL (ref 80.0–100.0)
Monocytes Absolute: 0.6 10*3/uL (ref 0.1–1.0)
Monocytes Relative: 7 %
Neutro Abs: 5.4 10*3/uL (ref 1.7–7.7)
Neutrophils Relative %: 67 %
Platelet Count: 253 10*3/uL (ref 150–400)
RBC: 3.02 MIL/uL — ABNORMAL LOW (ref 4.22–5.81)
RDW: 14.3 % (ref 11.5–15.5)
WBC Count: 8 10*3/uL (ref 4.0–10.5)
nRBC: 0 % (ref 0.0–0.2)

## 2021-03-28 LAB — SAMPLE TO BLOOD BANK

## 2021-03-28 LAB — FOLATE: Folate: 18.5 ng/mL (ref >5.9)

## 2021-03-28 LAB — FERRITIN: Ferritin: 7 ng/mL — ABNORMAL LOW (ref 24–336)

## 2021-03-28 NOTE — Patient Instructions (Signed)
Iron-Rich Diet  Iron is a mineral that helps your body produce hemoglobin. Hemoglobin is a protein in red blood cells that carries oxygen to your body's tissues. Eating too little iron may cause you to feel weak and tired, and it can increase your risk of infection. Iron is naturally found in many foods, and many foods have iron added to them (are iron-fortified). You may need to follow an iron-rich diet if you do not have enough iron in your body due to certain medical conditions. The amount of iron that you need each day depends on your age, your sex, and any medical conditions you have. Follow instructions from your health care provider or a dietitian about how much ironyou should eat each day. What are tips for following this plan? Reading food labels Check food labels to see how many milligrams (mg) of iron are in each serving. Cooking Cook foods in pots and pans that are made from iron. Take these steps to make it easier for your body to absorb iron from certain foods: Soak beans overnight before cooking. Soak whole grains overnight and drain them before using. Ferment flours before baking, such as by using yeast in bread dough. Meal planning When you eat foods that contain iron, you should eat them with foods that are high in vitamin C. These include oranges, peppers, tomatoes, potatoes, and mangoes. Vitamin C helps your body absorb iron. Certain foods and drinks prevent your body from absorbing iron properly. Avoid eating these foods in the same meal as iron-rich foods or with iron supplements. These foods include: Coffee, black tea, and red wine. Milk, dairy products, and foods that are high in calcium. Beans and soybeans. Whole grains. General information Take iron supplements only as told by your health care provider. An overdose of iron can be life-threatening. If you were prescribed iron supplements, take them with orange juice or a vitamin C supplement. When you eat iron-fortified  foods or take an iron supplement, you should also eat foods that naturally contain iron, such as meat, poultry, and fish. Eating naturally iron-rich foods helps your body absorb the iron that is added to other foods or contained in a supplement. Iron from animal sources is better absorbed than iron from plant sources. What foods should I eat? Fruits Prunes. Raisins. Eat fruits high in vitamin C, such as oranges, grapefruits, and strawberries,with iron-rich foods. Vegetables Spinach (cooked). Green peas. Broccoli. Fermented vegetables. Eat vegetables high in vitamin C, such as leafy greens, potatoes, bell peppers,and tomatoes, with iron-rich foods. Grains Iron-fortified breakfast cereal. Iron-fortified whole-wheat bread. Enrichedrice. Sprouted grains. Meats and other proteins Beef liver. Beef. Turkey. Chicken. Oysters. Shrimp. Tuna. Sardines. Chickpeas.Nuts. Tofu. Pumpkin seeds. Beverages Tomato juice. Fresh orange juice. Prune juice. Hibiscus tea. Iron-fortifiedinstant breakfast shakes. Sweets and desserts Blackstrap molasses. Seasonings and condiments Tahini. Fermented soy sauce. Other foods Wheat germ. The items listed above may not be a complete list of recommended foods and beverages. Contact a dietitian for more information. What foods should I limit? These are foods that should be limited while eating iron-rich foods as they canreduce the absorption of iron in your body. Grains Whole grains. Bran cereal. Bran flour. Meats and other proteins Soybeans. Products made from soy protein. Black beans. Lentils. Mung beans.Split peas. Dairy Milk. Cream. Cheese. Yogurt. Cottage cheese. Beverages Coffee. Black tea. Red wine. Sweets and desserts Cocoa. Chocolate. Ice cream. Seasonings and condiments Basil. Oregano. Large amounts of parsley. The items listed above may not be a complete list of   foods and beverages you should limit. Contact a dietitian for more information. Summary Iron  is a mineral that helps your body produce hemoglobin. Hemoglobin is a protein in red blood cells that carries oxygen to your body's tissues. Iron is naturally found in many foods, and many foods have iron added to them (are iron-fortified). When you eat foods that contain iron, you should eat them with foods that are high in vitamin C. Vitamin C helps your body absorb iron. Certain foods and drinks prevent your body from absorbing iron properly, such as whole grains and dairy products. You should avoid eating these foods in the same meal as iron-rich foods or with iron supplements. This information is not intended to replace advice given to you by your health care provider. Make sure you discuss any questions you have with your healthcare provider. Document Revised: 09/13/2020 Document Reviewed: 09/13/2020 Elsevier Patient Education  2022 Elsevier Inc.  

## 2021-03-29 LAB — PROTEIN ELECTROPHORESIS, SERUM, WITH REFLEX
A/G Ratio: 1.3 (ref 0.7–1.7)
Albumin ELP: 3.3 g/dL (ref 2.9–4.4)
Alpha-1-Globulin: 0.2 g/dL (ref 0.0–0.4)
Alpha-2-Globulin: 0.8 g/dL (ref 0.4–1.0)
Beta Globulin: 0.9 g/dL (ref 0.7–1.3)
Gamma Globulin: 0.6 g/dL (ref 0.4–1.8)
Globulin, Total: 2.5 g/dL (ref 2.2–3.9)
Total Protein ELP: 5.8 g/dL — ABNORMAL LOW (ref 6.0–8.5)

## 2021-03-30 ENCOUNTER — Encounter: Payer: Self-pay | Admitting: Physician Assistant

## 2021-03-30 ENCOUNTER — Other Ambulatory Visit: Payer: Self-pay

## 2021-03-30 MED FILL — Lisinopril Tab 40 MG: ORAL | 30 days supply | Qty: 30 | Fill #2 | Status: AC

## 2021-03-31 ENCOUNTER — Telehealth: Payer: Self-pay | Admitting: Physician Assistant

## 2021-03-31 NOTE — Telephone Encounter (Signed)
Scheduled per los. Called and spoke with patient. Patient would only like to try one iron infusion. Did not want me to schedule all three as requested. Informed that he would be going to our [facility] location and I will call once it is scheduled

## 2021-04-01 ENCOUNTER — Other Ambulatory Visit: Payer: Self-pay

## 2021-04-01 MED FILL — Atorvastatin Calcium Tab 80 MG (Base Equivalent): ORAL | 30 days supply | Qty: 30 | Fill #2 | Status: CN

## 2021-04-01 NOTE — Progress Notes (Signed)
..  The following Medication: Venofer is approved for drug replacement program by [employer]. The enrollment period is from 04/01/2021 to 06/30/2021.  Reason for Assistance: Self Pay. ID: [id number]. First DOS:TBD.

## 2021-04-04 ENCOUNTER — Other Ambulatory Visit: Payer: Self-pay

## 2021-04-04 ENCOUNTER — Encounter: Payer: Self-pay | Admitting: Physician Assistant

## 2021-04-05 ENCOUNTER — Other Ambulatory Visit: Payer: Self-pay

## 2021-04-05 ENCOUNTER — Inpatient Hospital Stay: Payer: Self-pay

## 2021-04-05 VITALS — BP 119/48 | HR 95 | Temp 98.0°F | Resp 18

## 2021-04-05 DIAGNOSIS — D509 Iron deficiency anemia, unspecified: Secondary | ICD-10-CM

## 2021-04-05 MED ORDER — SODIUM CHLORIDE 0.9 % IV SOLN
300.0000 mg | Freq: Once | INTRAVENOUS | Status: DC
  Filled 2021-04-05: qty 15

## 2021-04-05 MED ORDER — DIPHENHYDRAMINE HCL 25 MG PO CAPS: 50.0000 mg | ORAL_CAPSULE | Freq: Once | ORAL | Status: DC

## 2021-04-05 MED ORDER — ACETAMINOPHEN 325 MG PO TABS: 650.0000 mg | ORAL_TABLET | Freq: Once | ORAL | Status: DC

## 2021-04-05 MED ORDER — ACETAMINOPHEN 325 MG PO TABS
ORAL_TABLET | ORAL | Status: AC
  Filled 2021-04-05: qty 2

## 2021-04-05 MED ORDER — DIPHENHYDRAMINE HCL 25 MG PO CAPS
ORAL_CAPSULE | ORAL | Status: AC
  Filled 2021-04-05: qty 2

## 2021-04-05 MED ORDER — SODIUM CHLORIDE 0.9 % IV SOLN
Freq: Once | INTRAVENOUS | Status: DC
  Filled 2021-04-05: qty 250

## 2021-04-05 NOTE — Progress Notes (Signed)
Patient walked out while IV was being attempted by nurse. Patient stated,"take it out. I'm done."

## 2021-04-06 ENCOUNTER — Telehealth: Payer: Self-pay

## 2021-04-06 NOTE — Telephone Encounter (Signed)
I have LM for pt advising of this and to determine if he wants to be scheduled for the other 2 infusions as recommended at his last visit (pt had reservations).

## 2021-04-06 NOTE — Telephone Encounter (Signed)
-----   Message from Doran Heater, [facility] sent at 04/01/2021  2:37 PM EDT ----- Regarding: Venofer Patient Assistance Good afternoon,  Just wanted to update everyone, patient is approved for Venofer Patient Assistance till 06/30/2021, temporarily to allow him time to apply for medical coverage.  Thanks!

## 2021-04-07 ENCOUNTER — Other Ambulatory Visit: Payer: Self-pay

## 2021-04-07 ENCOUNTER — Other Ambulatory Visit: Payer: Self-pay | Admitting: Internal Medicine

## 2021-04-07 ENCOUNTER — Encounter: Payer: Self-pay | Admitting: Physician Assistant

## 2021-04-07 ENCOUNTER — Ambulatory Visit: Payer: Self-pay

## 2021-04-07 NOTE — Telephone Encounter (Signed)
Requested medication (s) are due for refill today - unsure  Requested medication (s) are on the active medication list -yes  Future visit scheduled -yes  Last refill: 01/14/21  Notes to clinic: Request RF- non delegated Rx  Requested Prescriptions  Pending Prescriptions Disp Refills   traMADol (ULTRAM) 50 MG tablet 60 tablet 0    Sig: Take 1 tablet (50 mg total) by mouth every 12 (twelve) hours as needed.      Not Delegated - Analgesics:  Opioid Agonists Failed - 04/07/2021  4:09 PM      Failed - This refill cannot be delegated      Failed - Urine Drug Screen completed in last 360 days      Passed - Valid encounter within last 6 months    Recent Outpatient Visits           3 weeks ago Dizziness   [facility] Marcine Matar, MD   3 months ago Preoperative evaluation to rule out surgical contraindication   [facility] Marcine Matar, MD   4 months ago Acute pain of right knee   [facility] Jonah Blue B, MD   7 months ago Type 2 diabetes mellitus with diabetic polyneuropathy, without long-term current use of insulin [facility])   [facility] Marcine Matar, MD   8 months ago Acute abdominal pain   [facility] Marcine Matar, MD       Future Appointments             In 1 month Marcine Matar, MD [facility]                 Requested Prescriptions  Pending Prescriptions Disp Refills   traMADol (ULTRAM) 50 MG tablet 60 tablet 0    Sig: Take 1 tablet (50 mg total) by mouth every 12 (twelve) hours as needed.      Not Delegated - Analgesics:  Opioid Agonists Failed - 04/07/2021  4:09 PM      Failed - This refill cannot be delegated      Failed - Urine Drug Screen completed in last 360 days      Passed - Valid encounter within last 6 months    Recent Outpatient Visits            3 weeks ago Dizziness   [facility] Marcine Matar, MD   3 months ago Preoperative evaluation to rule out surgical contraindication   [facility] Marcine Matar, MD   4 months ago Acute pain of right knee   [facility] Jonah Blue B, MD   7 months ago Type 2 diabetes mellitus with diabetic polyneuropathy, without long-term current use of insulin [facility])   [place] [facility] Marcine Matar, MD   8 months ago Acute abdominal pain   [facility] Marcine Matar, MD       Future Appointments             In 1 month Laural Benes Binnie Rail, MD [facility]

## 2021-04-07 NOTE — Telephone Encounter (Signed)
Pt LM requesting a call and upset stating no one is calling him about his appts.  It is unclear why the pt feels this way at this time. I have left another message for him and a message for his significant other as well.

## 2021-04-07 NOTE — Telephone Encounter (Signed)
Pt returned my call and advised he is willing to have the iron infusion but not at [facility]. I advised the pt he can receive it here at [facility] tomorrow with an arrival time of 7:45am. Pt agreed and expressed understanding of this appt information.

## 2021-04-08 ENCOUNTER — Inpatient Hospital Stay: Payer: Self-pay

## 2021-04-08 ENCOUNTER — Other Ambulatory Visit: Payer: Self-pay

## 2021-04-08 VITALS — BP 148/64 | HR 95 | Temp 97.8°F | Resp 18

## 2021-04-08 DIAGNOSIS — D509 Iron deficiency anemia, unspecified: Secondary | ICD-10-CM

## 2021-04-08 MED ORDER — DIPHENHYDRAMINE HCL 25 MG PO CAPS
50.0000 mg | ORAL_CAPSULE | Freq: Once | ORAL | Status: AC
  Administered 2021-04-08: 50 mg via ORAL

## 2021-04-08 MED ORDER — SODIUM CHLORIDE 0.9 % IV SOLN
Freq: Once | INTRAVENOUS | Status: AC
  Filled 2021-04-08: qty 250

## 2021-04-08 MED ORDER — ACETAMINOPHEN 325 MG PO TABS
650.0000 mg | ORAL_TABLET | Freq: Once | ORAL | Status: AC
  Administered 2021-04-08: 650 mg via ORAL

## 2021-04-08 MED ORDER — ACETAMINOPHEN 325 MG PO TABS
ORAL_TABLET | ORAL | Status: AC
  Filled 2021-04-08: qty 2

## 2021-04-08 MED ORDER — DIPHENHYDRAMINE HCL 25 MG PO CAPS
ORAL_CAPSULE | ORAL | Status: AC
  Filled 2021-04-08: qty 2

## 2021-04-08 MED ORDER — SODIUM CHLORIDE 0.9 % IV SOLN
300.0000 mg | Freq: Once | INTRAVENOUS | Status: AC
  Administered 2021-04-08: 300 mg via INTRAVENOUS
  Filled 2021-04-08: qty 300

## 2021-04-08 NOTE — Patient Instructions (Signed)
Iron Dextran injection What is this medication? IRON DEXTRAN (AHY ern DEX tran) is an iron complex. Iron is used to make healthy red blood cells, which carry oxygen and nutrients through the body. This medicine is used to treat people who cannot take iron by mouth and havelow levels of iron in the blood. This medicine may be used for other purposes; ask your health care provider orpharmacist if you have questions. COMMON BRAND NAME(S): Dexferrum, INFeD What should I tell my care team before I take this medication? They need to know if you have any of these conditions: anemia not caused by low iron levels heart disease high levels of iron in the blood kidney disease liver disease an unusual or allergic reaction to iron, other medicines, foods, dyes, or preservatives pregnant or trying to get pregnant breast-feeding How should I use this medication? This medicine is for injection into a vein or a muscle. It is given by a healthcare professional in a hospital or clinic setting. Talk to your pediatrician regarding the use of this medicine in children. While this drug may be prescribed for children as young as 4 months old for selectedconditions, precautions do apply. Overdosage: If you think you have taken too much of this medicine contact apoison control center or emergency room at once. NOTE: This medicine is only for you. Do not share this medicine with others. What if I miss a dose? It is important not to miss your dose. Call your doctor or health careprofessional if you are unable to keep an appointment. What may interact with this medication? Do not take this medicine with any of the following medications: deferoxamine dimercaprol other iron products This medicine may also interact with the following medications: chloramphenicol deferasirox This list may not describe all possible interactions. Give your health care provider a list of all the medicines, herbs, non-prescription drugs,  or dietary supplements you use. Also tell them if you smoke, drink alcohol, or use illegaldrugs. Some items may interact with your medicine. What should I watch for while using this medication? Visit your doctor or health care professional regularly. Tell your doctor if your symptoms do not start to get better or if they get worse. You may needblood work done while you are taking this medicine. You may need to follow a special diet. Talk to your doctor. Foods that contain iron include: whole grains/cereals, dried fruits, beans, or peas, leafy greenvegetables, and organ meats (liver, kidney). Long-term use of this medicine may increase your risk of some cancers. Talk toyour doctor about how to limit your risk. What side effects may I notice from receiving this medication? Side effects that you should report to your doctor or health care professionalas soon as possible: allergic reactions like skin rash, itching or hives, swelling of the face, lips, or tongue blue lips, nails, or skin breathing problems changes in blood pressure chest pain confusion fast, irregular heartbeat feeling faint or lightheaded, falls fever or chills flushing, sweating, or hot feelings joint or muscle aches or pains pain, tingling, numbness in the hands or feet seizures unusually weak or tired Side effects that usually do not require medical attention (report to yourdoctor or health care professional if they continue or are bothersome): change in taste (metallic taste) diarrhea headache irritation at site where injected nausea, vomiting stomach upset This list may not describe all possible side effects. Call your doctor for medical advice about side effects. You may report side effects to FDA at1-800-FDA-1088. Where should I keep my   medication? This drug is given in a hospital or clinic and will not be stored at home. NOTE: This sheet is a summary. It may not cover all possible information. If you have questions  about this medicine, talk to your doctor, pharmacist, orhealth care provider.  2022 Elsevier/Gold Standard (2008-02-18 16:59:50)  

## 2021-04-09 ENCOUNTER — Other Ambulatory Visit: Payer: Self-pay | Admitting: Internal Medicine

## 2021-04-09 DIAGNOSIS — M160 Bilateral primary osteoarthritis of hip: Secondary | ICD-10-CM

## 2021-04-09 DIAGNOSIS — G8929 Other chronic pain: Secondary | ICD-10-CM

## 2021-04-09 MED ORDER — TRAMADOL HCL 50 MG PO TABS: 50.0000 mg | ORAL_TABLET | Freq: Two times a day (BID) | ORAL | 0 refills | Status: DC | PRN

## 2021-04-09 MED ORDER — TRAMADOL HCL 50 MG PO TABS
50.0000 mg | ORAL_TABLET | Freq: Two times a day (BID) | ORAL | 0 refills | Status: DC | PRN
  Filled 2021-04-09: qty 60, 30d supply, fill #0

## 2021-04-11 ENCOUNTER — Encounter: Payer: Self-pay | Admitting: Internal Medicine

## 2021-04-11 ENCOUNTER — Encounter: Payer: Self-pay | Admitting: Physician Assistant

## 2021-04-11 ENCOUNTER — Other Ambulatory Visit: Payer: Self-pay

## 2021-04-13 ENCOUNTER — Encounter: Payer: Self-pay | Admitting: Medical Oncology

## 2021-04-19 ENCOUNTER — Other Ambulatory Visit: Payer: Self-pay

## 2021-04-19 ENCOUNTER — Encounter: Payer: Self-pay | Admitting: Physician Assistant

## 2021-04-19 MED FILL — Atorvastatin Calcium Tab 80 MG (Base Equivalent): ORAL | 30 days supply | Qty: 30 | Fill #2 | Status: AC

## 2021-04-22 ENCOUNTER — Other Ambulatory Visit: Payer: Self-pay

## 2021-04-25 MED FILL — Lisinopril Tab 40 MG: ORAL | 30 days supply | Qty: 30 | Fill #3 | Status: AC

## 2021-04-26 ENCOUNTER — Other Ambulatory Visit: Payer: Self-pay

## 2021-04-26 ENCOUNTER — Encounter: Payer: Self-pay | Admitting: Physician Assistant

## 2021-04-27 ENCOUNTER — Other Ambulatory Visit: Payer: Self-pay | Admitting: Medical Oncology

## 2021-04-27 ENCOUNTER — Other Ambulatory Visit: Payer: Self-pay

## 2021-04-27 ENCOUNTER — Encounter: Payer: Self-pay | Admitting: Internal Medicine

## 2021-04-27 ENCOUNTER — Telehealth: Payer: Self-pay | Admitting: Medical Oncology

## 2021-04-27 ENCOUNTER — Ambulatory Visit (INDEPENDENT_AMBULATORY_CARE_PROVIDER_SITE_OTHER): Payer: Self-pay | Admitting: Internal Medicine

## 2021-04-27 VITALS — BP 127/57 | HR 87 | Temp 97.7°F | Ht 70.0 in | Wt 216.6 lb

## 2021-04-27 DIAGNOSIS — Z9884 Bariatric surgery status: Secondary | ICD-10-CM

## 2021-04-27 DIAGNOSIS — D509 Iron deficiency anemia, unspecified: Secondary | ICD-10-CM

## 2021-04-27 DIAGNOSIS — K2100 Gastro-esophageal reflux disease with esophagitis, without bleeding: Secondary | ICD-10-CM

## 2021-04-27 DIAGNOSIS — K311 Adult hypertrophic pyloric stenosis: Secondary | ICD-10-CM

## 2021-04-27 DIAGNOSIS — K2270 Barrett's esophagus without dysplasia: Secondary | ICD-10-CM

## 2021-04-27 NOTE — Telephone Encounter (Signed)
Wants iron dextran  tx on  Thursdays or Fridays so he can recover from joint pain over the weekend.  He received his 1st dose on 06/24.  He sees you on 07/26 after labs.  Does he need other iron dextran infusions ?

## 2021-04-27 NOTE — Progress Notes (Unsigned)
B re 

## 2021-04-27 NOTE — Patient Instructions (Signed)
I will refer you today to a dietitian to discuss post Roux-en-Y diets.  Hopefully they will be able to provide insight on certain foods to eat and certain ones to avoid.  In regards to your iron deficiency, I would recommend you reach out to your hematologist to schedule your next IV infusion.  If you have constipation, I would recommend using MiraLAX (polyethylene glycol) 1 capful daily as needed.  If this is not adequate he can go up to 2 capfuls daily.  Otherwise follow-up with GI in 6 months.  At [facility] we value your feedback. You may receive a survey about your visit today. Please share your experience as we strive to create trusting relationships with our patients to provide genuine, compassionate, quality care.  We appreciate your understanding and patience as we review any laboratory studies, imaging, and other diagnostic tests that are ordered as we care for you. Our office policy is 5 business days for review of these results, and any emergent or urgent results are addressed in a timely manner for your best interest. If you do not hear from our office in 1 week, please contact us.   We also encourage the use of MyChart, which contains your medical information for your review as well. If you are not enrolled in this feature, an access code is on this after visit summary for your convenience. Thank you for allowing [name] to be involved in your care.  It was great to see you today!  I hope you have a great rest of your summer!!    Hennie Duos. Marletta Lor, D.O. Gastroenterology and Hepatology [facility]

## 2021-04-28 ENCOUNTER — Encounter: Payer: Self-pay | Admitting: Physician Assistant

## 2021-04-28 NOTE — Progress Notes (Signed)
Referring Provider: [name], MD Primary Care Physician:  [name], MD Primary GI:  Dr. Abbey Chatters  Chief Complaint  Patient presents with   Anemia    Had scar tissue removed from duodenum 11/2020    HPI:   Jeremy [name] is a 60 y.o. male who presents to the clinic today for follow-up visit.  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  This failed conservative management with PPI therapy and dilation.  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 Arnoldo Morale on 01/03/2021.  States he tolerated the procedure okay but is now having separate issues than he previously had.  He notes bloating as well as frequent loose stools.  Has been taking pantoprazole for acid reflux which he states is well controlled.  No dysphagia or odynophagia.  Patient has had issues with iron deficiency anemia since his procedure.  He is currently followed by hematology and receiving IV iron infusions.  Past Medical History:  Diagnosis Date   Diabetes mellitus without complication (HCC)    GERD (gastroesophageal reflux disease)    Hyperlipidemia    Hypertension    IDA (iron deficiency anemia)    Rash of entire body 03/2016   Sleep apnea     Past Surgical History:  Procedure Laterality Date   BACK SURGERY  1993   lumbar   BIOPSY  02/07/2019   Procedure: BIOPSY;  Surgeon: Danie Binder, MD;  Location: [facility];  Service: Endoscopy;;   CERVICAL SPINE SURGERY  2000   COLONOSCOPY N/A 02/07/2019   Dr. Oneida Alar: External hemorrhoids next colonoscopy in 10 years   ESOPHAGOGASTRODUODENOSCOPY N/A 02/07/2019   Dr. Oneida Alar: Barrett's esophagus without dysplasia chronic inactive gastritis but no H. 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, nonbleeding duodenal diverticulum,   ESOPHAGOGASTRODUODENOSCOPY N/A 01/05/2020   Procedure: ESOPHAGOGASTRODUODENOSCOPY (EGD);  Surgeon: Danie Binder, MD;  Location: [facility];  Service: Endoscopy;  Laterality: N/A;  10:30am   ESOPHAGOGASTRODUODENOSCOPY (EGD) WITH PROPOFOL N/A 12/06/2020   Procedure: ESOPHAGOGASTRODUODENOSCOPY (EGD) WITH PROPOFOL;  Surgeon: Eloise Harman, DO;  Location: [facility];  Service: Endoscopy;  Laterality: N/A;  11:45am   GASTROJEJUNOSTOMY N/A 01/03/2021   Procedure: GASTROJEJUNOSTOMY;  Surgeon: Aviva Signs, MD;  Location: [facility];  Service: General;  Laterality: N/A;   HAND SURGERY     SAVORY DILATION N/A 01/05/2020   Procedure: SAVORY DILATION;  Surgeon: Danie Binder, MD;  Location: [facility];  Service: Endoscopy;  Laterality: N/A;    Current Outpatient Medications  Medication Sig Dispense Refill   acetaminophen (TYLENOL) 500 MG tablet Take 1,000 mg by mouth in the morning and at bedtime.      atorvastatin (LIPITOR) 80 MG tablet TAKE 1 TABLET (80 MG TOTAL) BY MOUTH DAILY. 90 tablet 2   Blood Glucose Monitoring Suppl (TRUE METRIX METER) w/Device KIT 1 each by Does not apply route 4 (four) times daily -  before meals and at bedtime. 1 kit 0   cyclobenzaprine (FLEXERIL) 10 MG tablet Take 1 tablet (10 mg total) by mouth 3 (three) times daily as needed for muscle spasms. (Patient taking differently: Take 10 mg by mouth 2 (two) times daily.) 60 tablet 3   ferrous sulfate 325 (65 FE) MG tablet Take 1 tablet (325 mg total) by mouth daily with breakfast. (Patient taking differently: Take 325 mg by mouth 2 (two) times daily with  a meal.) 100 tablet 1   glipiZIDE (GLUCOTROL) 5 MG tablet TAKE 1 TABLET BY MOUTH IN THE MORNINGS AND 1 TABLET IN THE EVENINGS. 60 tablet 1   glucose blood test strip Use as instructed 100 each 12   lisinopril (ZESTRIL) 40 MG tablet TAKE 1 TABLET (40 MG TOTAL) BY MOUTH DAILY. 90 tablet 2   naproxen sodium (ALEVE) 220 MG tablet Take 220 mg by mouth in the morning and at bedtime.     ondansetron (ZOFRAN-ODT) 4 MG disintegrating tablet TAKE 1 TABLET (4 MG TOTAL) BY MOUTH EVERY 8  (EIGHT) HOURS AS NEEDED FOR UP TO 2 DAYS FOR NAUSEA OR VOMITING. 6 tablet 0   pantoprazole (PROTONIX) 40 MG tablet TAKE 1 TABLET (40 MG TOTAL) BY MOUTH 2 (TWO) TIMES DAILY BEFORE A MEAL. (Patient taking differently: Take by mouth 2 (two) times daily before a meal. Pt states he only takes this on Tuesday and Friday) 60 tablet 3   Potassium 99 MG TABS Take 99 mg by mouth 2 (two) times daily.     rOPINIRole (REQUIP) 0.5 MG tablet TAKE 1 TABLET (0.5 MG TOTAL) BY MOUTH AT BEDTIME. 90 tablet 1   sildenafil (VIAGRA) 50 MG tablet 1 TAB 1/2 HR PRIOR TO SEX. LIMIT USE TO 1 TAB/24 HRS 10 tablet 3   tamsulosin (FLOMAX) 0.4 MG CAPS capsule Take 1 capsule (0.4 mg total) by mouth daily. 30 capsule 5   traMADol (ULTRAM) 50 MG tablet Take 1 tablet (50 mg total) by mouth every 12 (twelve) hours as needed. (Patient taking differently: Take 50 mg by mouth 2 (two) times daily.) 60 tablet 0   traMADol (ULTRAM) 50 MG tablet Take 1 tablet (50 mg total) by mouth every 12 (twelve) hours as needed. (Patient taking differently: Take 50 mg by mouth 2 (two) times daily.) 60 tablet 0   TRUEplus Lancets 28G MISC CHECK BLOOD SUGAR FASTING AND BEFORE MEALS AND AGAIN IF PT FEELS BAD (SYMPTOMS OF HYPO). 100 each 12   vitamin B-12 (CYANOCOBALAMIN) 500 MCG tablet Take 1 tablet (500 mcg total) by mouth daily. 90 tablet 1   polyethylene glycol powder (GLYCOLAX/MIRALAX) 17 GM/SCOOP powder Take 17 g by mouth daily as needed. (Patient not taking: Reported on 04/27/2021) 510 g 1   No current facility-administered medications for this visit.    Allergies as of 04/27/2021 - Review Complete 04/27/2021  Allergen Reaction Noted   Cymbalta [duloxetine hcl] Other (See Comments) 06/05/2017   Doxycycline  03/02/2021   Other Nausea And Vomiting 01/24/2019   Robaxin [methocarbamol] Other (See Comments) 06/02/2019    Family History  Problem Relation Age of Onset   Pancreatic cancer Mother    CAD Father    Colon cancer Paternal Grandfather      Social History   Socioeconomic History   Marital status: Significant Other    Spouse name: Not on file   Number of children: Not on file   Years of education: Not on file   Highest education level: Not on file  Occupational History   Occupation: Clinical biochemist  Tobacco Use   Smoking status: Former    Types: Cigarettes    Quit date: 10/16/1988    Years since quitting: 32.5   Smokeless tobacco: Never  Vaping Use   Vaping Use: Never used  Substance and Sexual Activity   Alcohol use: No   Drug use: Yes    Comment: rare   Sexual activity: Not on file  Other Topics Concern   Not on  file  Social History Narrative   Volunteers as Airline pilot   Social Determinants of Radio broadcast assistant Strain: Not on file  Food Insecurity: Not on file  Transportation Needs: Not on file  Physical Activity: Not on file  Stress: Not on file  Social Connections: Not on file    Subjective: Review of Systems  Constitutional:  Negative for chills and fever.  HENT:  Negative for congestion and hearing loss.   Eyes:  Negative for blurred vision and double vision.  Respiratory:  Negative for cough and shortness of breath.   Cardiovascular:  Negative for chest pain and palpitations.  Gastrointestinal:  Negative for abdominal pain, blood in stool, constipation, diarrhea, heartburn, melena and vomiting.       Bloating, loose stools  Genitourinary:  Negative for dysuria and urgency.  Musculoskeletal:  Negative for joint pain and myalgias.  Skin:  Negative for itching and rash.  Neurological:  Negative for dizziness and headaches.  Psychiatric/Behavioral:  Negative for depression. The patient is not nervous/anxious.     Objective: BP (!) 127/57   Pulse 87   Temp 97.7 F (36.5 C) (Temporal)   Ht _0  (1.778 m)   Wt 216 lb 9.6 oz (98.2 kg)   BMI 31.08 kg/m  Physical Exam Constitutional:      Appearance: Normal appearance.  HENT:     Head: Normocephalic and atraumatic.   Eyes:     Extraocular Movements: Extraocular movements intact.     Conjunctiva/sclera: Conjunctivae normal.  Cardiovascular:     Rate and Rhythm: Normal rate and regular rhythm.  Pulmonary:     Effort: Pulmonary effort is normal.     Breath sounds: Normal breath sounds.  Abdominal:     General: Bowel sounds are normal.     Palpations: Abdomen is soft.  Musculoskeletal:        General: Normal range of motion.     Cervical back: Normal range of motion and neck supple.  Skin:    General: Skin is warm.  Neurological:     General: No focal deficit present.     Mental Status: He is alert and oriented to person, place, and time.  Psychiatric:        Mood and Affect: Mood normal.        Behavior: Behavior normal.     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  Discussed that he has a smaller gastric pouch now compared to prior which can lead certain deficiencies as well as bloating.  Continue to follow with hematology in regards to his iron deficiency anemia.  Recommend he call to schedule his next iron infusion.  Recommended he eat multiple small meals during the day versus large meals to decrease his upper GI symptoms.  I will refer him to dietitian to discuss post gastric bypass diet.  GERD well-controlled on pantoprazole, will continue.  Patient follow-up in 6 months.  04/28/2021 1:52 PM   Disclaimer: This note was dictated with voice recognition software. Similar sounding words can inadvertently be transcribed and may not be corrected upon review.

## 2021-04-29 ENCOUNTER — Other Ambulatory Visit: Payer: Self-pay

## 2021-04-29 ENCOUNTER — Telehealth: Payer: Self-pay | Admitting: Internal Medicine

## 2021-04-29 ENCOUNTER — Encounter: Payer: Self-pay | Admitting: Internal Medicine

## 2021-04-29 ENCOUNTER — Other Ambulatory Visit: Payer: Self-pay | Admitting: Internal Medicine

## 2021-04-29 NOTE — Telephone Encounter (Signed)
Sch per 7/7 los, pt aware 

## 2021-04-30 NOTE — Telephone Encounter (Signed)
Requested medication (s) are due for refill today: no  Requested medication (s) are on the active medication list: yes  Last refill:  04/09/21  Future visit scheduled: no  Notes to clinic:  med not delegated to NT to RF   Requested Prescriptions  Pending Prescriptions Disp Refills   traMADol (ULTRAM) 50 MG tablet 60 tablet 0    Sig: Take 1 tablet (50 mg total) by mouth every 12 (twelve) hours as needed.      Not Delegated - Analgesics:  Opioid Agonists Failed - 04/29/2021  7:07 PM      Failed - This refill cannot be delegated      Failed - Urine Drug Screen completed in last 360 days      Passed - Valid encounter within last 6 months    Recent Outpatient Visits           1 month ago Dizziness   [facility] Marcine Matar, MD   4 months ago Preoperative evaluation to rule out surgical contraindication   [facility] Marcine Matar, MD   5 months ago Acute pain of right knee   [facility] Jonah Blue B, MD   8 months ago Type 2 diabetes mellitus with diabetic polyneuropathy, without long-term current use of insulin [facility])   [facility] Marcine Matar, MD   9 months ago Acute abdominal pain   [facility] Marcine Matar, MD       Future Appointments             In 2 weeks Marcine Matar, MD [facility]

## 2021-05-02 ENCOUNTER — Other Ambulatory Visit: Payer: Self-pay

## 2021-05-02 ENCOUNTER — Other Ambulatory Visit: Payer: Self-pay | Admitting: Internal Medicine

## 2021-05-02 DIAGNOSIS — E119 Type 2 diabetes mellitus without complications: Secondary | ICD-10-CM

## 2021-05-03 ENCOUNTER — Other Ambulatory Visit: Payer: Self-pay

## 2021-05-03 MED ORDER — GLIPIZIDE 5 MG PO TABS
ORAL_TABLET | ORAL | 1 refills | Status: DC
  Filled 2021-05-03: qty 60, fill #0
  Filled 2021-05-10: qty 60, 30d supply, fill #0
  Filled 2021-06-06: qty 60, 30d supply, fill #1

## 2021-05-03 MED ORDER — TRAMADOL HCL 50 MG PO TABS
50.0000 mg | ORAL_TABLET | Freq: Two times a day (BID) | ORAL | 0 refills | Status: DC | PRN
  Filled 2021-05-10: qty 60, 30d supply, fill #0
  Filled ????-??-??: fill #0

## 2021-05-03 NOTE — Telephone Encounter (Signed)
   Notes to clinic:  The original prescription was discontinued on 03/17/2021 by Marcine Matar, MD. Renewing this prescription may not be appropriate   Requested Prescriptions  Pending Prescriptions Disp Refills   amLODipine (NORVASC) 10 MG tablet 90 tablet 3    Sig: TAKE 1 TABLET (10 MG TOTAL) BY MOUTH DAILY.      There is no refill protocol information for this order

## 2021-05-05 ENCOUNTER — Inpatient Hospital Stay: Payer: Self-pay

## 2021-05-06 ENCOUNTER — Other Ambulatory Visit: Payer: Self-pay

## 2021-05-06 ENCOUNTER — Inpatient Hospital Stay (HOSPITAL_COMMUNITY): Payer: Self-pay | Attending: Hematology

## 2021-05-06 VITALS — BP 121/61 | HR 79 | Temp 97.0°F | Resp 18 | Wt 218.6 lb

## 2021-05-06 DIAGNOSIS — D509 Iron deficiency anemia, unspecified: Secondary | ICD-10-CM | POA: Insufficient documentation

## 2021-05-06 DIAGNOSIS — Z79899 Other long term (current) drug therapy: Secondary | ICD-10-CM | POA: Insufficient documentation

## 2021-05-06 MED ORDER — SODIUM CHLORIDE 0.9 % IV SOLN: Freq: Once | INTRAVENOUS | Status: AC

## 2021-05-06 MED ORDER — ACETAMINOPHEN 325 MG PO TABS: 650.0000 mg | ORAL_TABLET | Freq: Once | ORAL | Status: DC

## 2021-05-06 MED ORDER — DIPHENHYDRAMINE HCL 25 MG PO CAPS
50.0000 mg | ORAL_CAPSULE | Freq: Once | ORAL | Status: AC
  Administered 2021-05-06: 50 mg via ORAL

## 2021-05-06 MED ORDER — DIPHENHYDRAMINE HCL 25 MG PO CAPS
ORAL_CAPSULE | ORAL | Status: AC
  Filled 2021-05-06: qty 2

## 2021-05-06 MED ORDER — SODIUM CHLORIDE 0.9 % IV SOLN
300.0000 mg | Freq: Once | INTRAVENOUS | Status: AC
  Administered 2021-05-06: 300 mg via INTRAVENOUS
  Filled 2021-05-06: qty 300

## 2021-05-06 NOTE — Progress Notes (Signed)
Iron given per orders. Patient tolerated it well without problems. Vitals stable and discharged home from clinic ambulatory. Follow up as scheduled.  

## 2021-05-06 NOTE — Progress Notes (Signed)
Patient is here today for Venofer infusion.  Patient has no complaints today.  Fatigue is improving.  Vitals signs are stable.

## 2021-05-06 NOTE — Patient Instructions (Signed)
[facility]  Discharge Instructions: Thank you for choosing [facility] to provide your oncology and hematology care.  If you have a lab appointment with the Cancer Center, please come in thru the Main Entrance and check in at the main information desk.     We strive to give you quality time with your provider. You may need to reschedule your appointment if you arrive late (15 or more minutes).  Arriving late affects you and other patients whose appointments are after yours.  Also, if you miss three or more appointments without notifying the office, you may be dismissed from the clinic at the provider's discretion.      For prescription refill requests, have your pharmacy contact our office and allow 72 hours for refills to be completed.     Should you have questions after your visit or need to cancel or reschedule your appointment, please contact [facility] [phone]  and follow the prompts.  Office hours are 8:00 a.m. to 4:30 p.m. Monday - Friday. Please note that voicemails left after 4:00 p.m. may not be returned until the following business day.  We are closed weekends and major holidays. You have access to a nurse at all times for urgent questions. Please call the main number to the clinic [phone] and follow the prompts.  For any non-urgent questions, you may also contact your provider using MyChart. We now offer e-Visits for anyone 18 and older to request care online for non-urgent symptoms. For details visit [email or web address].   Also download the MyChart app! Go to the app store, search "MyChart", open the app, select [facility], and log in with your MyChart username and password.  Due to Covid, a mask is required upon entering the hospital/clinic. If you do not have a mask, one will be given to you upon arrival. For doctor visits, patients may have 1 support person aged 18 or older with them. For treatment visits, patients cannot have  anyone with them due to current Covid guidelines and our immunocompromised population.  

## 2021-05-10 ENCOUNTER — Other Ambulatory Visit: Payer: Self-pay | Admitting: Pharmacist

## 2021-05-10 ENCOUNTER — Ambulatory Visit: Payer: Self-pay | Admitting: Internal Medicine

## 2021-05-10 ENCOUNTER — Encounter: Payer: Self-pay | Admitting: Physician Assistant

## 2021-05-10 ENCOUNTER — Other Ambulatory Visit: Payer: Self-pay

## 2021-05-10 DIAGNOSIS — D509 Iron deficiency anemia, unspecified: Secondary | ICD-10-CM

## 2021-05-10 MED ORDER — FERROUS SULFATE 325 (65 FE) MG PO TABS
325.0000 mg | ORAL_TABLET | Freq: Two times a day (BID) | ORAL | 2 refills | Status: DC
  Filled 2021-05-10: qty 60, 30d supply, fill #0
  Filled 2021-06-06: qty 60, 30d supply, fill #1
  Filled 2021-07-04 – 2021-07-13 (×2): qty 60, 30d supply, fill #2

## 2021-05-13 ENCOUNTER — Other Ambulatory Visit: Payer: Self-pay | Admitting: Physician Assistant

## 2021-05-13 ENCOUNTER — Inpatient Hospital Stay: Payer: Self-pay | Attending: Physician Assistant

## 2021-05-13 ENCOUNTER — Other Ambulatory Visit: Payer: Self-pay

## 2021-05-13 VITALS — BP 114/58 | HR 68 | Temp 97.8°F | Resp 18

## 2021-05-13 DIAGNOSIS — D509 Iron deficiency anemia, unspecified: Secondary | ICD-10-CM | POA: Insufficient documentation

## 2021-05-13 DIAGNOSIS — Z79899 Other long term (current) drug therapy: Secondary | ICD-10-CM | POA: Insufficient documentation

## 2021-05-13 MED ORDER — SODIUM CHLORIDE 0.9 % IV SOLN
300.0000 mg | Freq: Once | INTRAVENOUS | Status: AC
  Administered 2021-05-13: 300 mg via INTRAVENOUS
  Filled 2021-05-13: qty 300

## 2021-05-13 MED ORDER — SODIUM CHLORIDE 0.9 % IV SOLN
Freq: Once | INTRAVENOUS | Status: AC
  Filled 2021-05-13: qty 250

## 2021-05-13 MED ORDER — ACETAMINOPHEN 325 MG PO TABS
ORAL_TABLET | ORAL | Status: AC
  Filled 2021-05-13: qty 2

## 2021-05-13 MED ORDER — DIPHENHYDRAMINE HCL 25 MG PO CAPS
ORAL_CAPSULE | ORAL | Status: AC
  Filled 2021-05-13: qty 1

## 2021-05-13 MED ORDER — ACETAMINOPHEN 325 MG PO TABS
650.0000 mg | ORAL_TABLET | Freq: Once | ORAL | Status: AC
  Administered 2021-05-13: 650 mg via ORAL

## 2021-05-13 MED ORDER — DIPHENHYDRAMINE HCL 25 MG PO CAPS
25.0000 mg | ORAL_CAPSULE | Freq: Once | ORAL | Status: AC
  Administered 2021-05-13: 25 mg via ORAL

## 2021-05-13 NOTE — Progress Notes (Signed)
Pt observed for 30 minutes post Venofer infusion. Tolerated treatment well. VSS. Pt ambulatory to lobby. 

## 2021-05-13 NOTE — Patient Instructions (Signed)

## 2021-05-17 ENCOUNTER — Encounter: Payer: Self-pay | Admitting: Physician Assistant

## 2021-05-17 ENCOUNTER — Other Ambulatory Visit: Payer: Self-pay

## 2021-05-17 ENCOUNTER — Ambulatory Visit: Payer: Self-pay | Attending: Internal Medicine | Admitting: Internal Medicine

## 2021-05-17 ENCOUNTER — Encounter: Payer: Self-pay | Admitting: Internal Medicine

## 2021-05-17 VITALS — BP 115/69 | HR 84 | Resp 16 | Wt 213.0 lb

## 2021-05-17 DIAGNOSIS — E871 Hypo-osmolality and hyponatremia: Secondary | ICD-10-CM

## 2021-05-17 DIAGNOSIS — E669 Obesity, unspecified: Secondary | ICD-10-CM

## 2021-05-17 DIAGNOSIS — J302 Other seasonal allergic rhinitis: Secondary | ICD-10-CM

## 2021-05-17 DIAGNOSIS — E1169 Type 2 diabetes mellitus with other specified complication: Secondary | ICD-10-CM

## 2021-05-17 DIAGNOSIS — Z9989 Dependence on other enabling machines and devices: Secondary | ICD-10-CM

## 2021-05-17 DIAGNOSIS — G4733 Obstructive sleep apnea (adult) (pediatric): Secondary | ICD-10-CM

## 2021-05-17 DIAGNOSIS — I10 Essential (primary) hypertension: Secondary | ICD-10-CM

## 2021-05-17 DIAGNOSIS — D509 Iron deficiency anemia, unspecified: Secondary | ICD-10-CM

## 2021-05-17 LAB — GLUCOSE, POCT (MANUAL RESULT ENTRY): POC Glucose: 143 mg/dl — AB (ref 70–99)

## 2021-05-17 MED ORDER — CYCLOBENZAPRINE HCL 10 MG PO TABS
10.0000 mg | ORAL_TABLET | Freq: Two times a day (BID) | ORAL | 3 refills | Status: DC | PRN
  Filled 2021-05-17: qty 60, 30d supply, fill #0
  Filled 2021-06-13 – 2021-06-23 (×2): qty 60, 30d supply, fill #1
  Filled 2021-07-16: qty 60, 30d supply, fill #2
  Filled 2021-08-21: qty 60, 30d supply, fill #3

## 2021-05-17 MED ORDER — FLUTICASONE PROPIONATE 50 MCG/ACT NA SUSP
1.0000 | Freq: Every day | NASAL | 0 refills | Status: DC | PRN
Start: 2021-05-17 — End: 2021-08-21
  Filled 2021-05-17: qty 16, 30d supply, fill #0

## 2021-05-17 NOTE — Progress Notes (Signed)
Patient ID: Jeremy [name], male    DOB: 09-15-1961  MRN: [id number]  CC: Diabetes   Subjective: Jeremy [name] is a 60 y.o. 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  Myoview neg 02/2021)  Last seen by me 2 months ago post ER visit complaining of just not feeling well including complaint of dizziness.  Found to have worsening anemia with hemoglobin down to 8.  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  Started on iron supplement.  B12 level in the low normal range.  I recommended taking B12 supplement over-the-counter which she has been doing.   -Patient was up-to-date with EGD and colonoscopy.   -Referred to hematology and endocrinology.  Saw the hematologist Dr. Julien Nordmann on 03/28/2021.  Started on iron infusion.  He has had 3 so far.  He continues to take the oral iron supplement.  Feeling somewhat better. -He was also seen by the gastroenterologist Dr. Abbey Chatters last month.  Advised to continue taking pantoprazole for GERD.  Referred to nutritionist for post gastric bypass dietary counseling.  That appointment is scheduled for the 10th of this month. Patient tells me he has been taking the pantoprazole only twice a week.  He has a history of Barrett's esophagus. -Given the type of surgery that he had, he is taking a multivitamin tablet.  He is also taking vitamin D and vitamin B12 supplement.  He also has over-the-counter potassium 99 mg that he is taking on his own accord.  Hyponatremia: Lab studies done previously suggested SIADH but his sodium level has been improving.  He is not on any diuretics. Obstructive sleep apnea: He reports using his CPAP machine consistently every  night.  DM: He has not been checking his blood sugars lately.  He has had weight loss associated with the Roux-en-Y procedure.  Tells me that his last weight at home today was 209 pounds.  He is eating smaller portions.  He is on glipizide 5 mg twice a day.  HTN: On last visit I had him decrease the lisinopril 40 mg daily to just half a tablet daily.  However he tells me that since then he has been taking 40 mg half a tablet twice a day and that his blood pressure has been good.  He continues to take the tramadol as needed for back and hip pain associated with arthritis. Patient Active Problem List   Diagnosis Date Noted   Low serum vitamin B12 03/23/2021   Vitamin D deficiency 03/23/2021   S/P bypass gastrojejunostomy 01/03/2021   Gastric outlet obstruction    Duodenal stricture    Primary osteoarthritis of both hips 11/15/2020   Aortic atherosclerosis ([place]) 08/30/2020   Coronary artery calcification 08/30/2020   Bilateral carpal tunnel syndrome 06/07/2020   Grief reaction 06/07/2020   Pyloric stenosis in adult 02/05/2020   Congenital hypertrophic pyloric stenosis    Duodenal web    Barrett's esophagus without dysplasia 05/19/2019   OSA (obstructive sleep apnea) 04/30/2019   Iron deficiency anemia 11/25/2018   Phimosis of penis 11/19/2018   Esophageal dysphagia 08/19/2018   Benign prostatic hyperplasia with post-void dribbling 01/31/2018   Absence of bladder continence 01/31/2018   Obesity (BMI 30-39.9) 11/05/2017  GERD (gastroesophageal reflux disease) 06/03/2017   Chronic pain of both knees 06/03/2017   Chronic lower back pain 06/03/2017   Type 2 diabetes mellitus without complication, without long-term current use of insulin ([place]) 04/12/2016   Essential hypertension 04/12/2016     Current Outpatient Medications on File Prior to Visit  Medication Sig Dispense Refill   acetaminophen (TYLENOL) 500 MG tablet Take 1,000 mg by mouth in the morning and at bedtime.       atorvastatin (LIPITOR) 80 MG tablet TAKE 1 TABLET (80 MG TOTAL) BY MOUTH DAILY. 90 tablet 2   Blood Glucose Monitoring Suppl (TRUE METRIX METER) w/Device KIT 1 each by Does not apply route 4 (four) times daily -  before meals and at bedtime. 1 kit 0   cyclobenzaprine (FLEXERIL) 10 MG tablet Take 1 tablet (10 mg total) by mouth 3 (three) times daily as needed for muscle spasms. (Patient taking differently: Take 10 mg by mouth 2 (two) times daily.) 60 tablet 3   ferrous sulfate 325 (65 FE) MG tablet Take 1 tablet (325 mg total) by mouth 2 (two) times daily with a meal. 60 tablet 2   glipiZIDE (GLUCOTROL) 5 MG tablet TAKE 1 TABLET BY MOUTH IN THE MORNINGS AND 1 TABLET IN THE EVENINGS. 60 tablet 1   glucose blood test strip Use as instructed 100 each 12   lisinopril (ZESTRIL) 40 MG tablet TAKE 1 TABLET (40 MG TOTAL) BY MOUTH DAILY. 90 tablet 2   naproxen sodium (ALEVE) 220 MG tablet Take 220 mg by mouth in the morning and at bedtime.     ondansetron (ZOFRAN-ODT) 4 MG disintegrating tablet TAKE 1 TABLET (4 MG TOTAL) BY MOUTH EVERY 8 (EIGHT) HOURS AS NEEDED FOR UP TO 2 DAYS FOR NAUSEA OR VOMITING. 6 tablet 0   pantoprazole (PROTONIX) 40 MG tablet TAKE 1 TABLET (40 MG TOTAL) BY MOUTH 2 (TWO) TIMES DAILY BEFORE A MEAL. (Patient taking differently: Take by mouth 2 (two) times daily before a meal. Pt states he only takes this on Tuesday and Friday) 60 tablet 3   polyethylene glycol powder (GLYCOLAX/MIRALAX) 17 GM/SCOOP powder Take 17 g by mouth daily as needed. 510 g 1   Potassium 99 MG TABS Take 99 mg by mouth 2 (two) times daily.     rOPINIRole (REQUIP) 0.5 MG tablet TAKE 1 TABLET (0.5 MG TOTAL) BY MOUTH AT BEDTIME. 90 tablet 1   sildenafil (VIAGRA) 50 MG tablet 1 TAB 1/2 HR PRIOR TO SEX. LIMIT USE TO 1 TAB/24 HRS 10 tablet 3   tamsulosin (FLOMAX) 0.4 MG CAPS capsule Take 1 capsule (0.4 mg total) by mouth daily. 30 capsule 5   traMADol (ULTRAM) 50 MG tablet Take 1 tablet (50 mg total) by mouth every 12  (twelve) hours as needed. 60 tablet 0   TRUEplus Lancets 28G MISC CHECK BLOOD SUGAR FASTING AND BEFORE MEALS AND AGAIN IF PT FEELS BAD (SYMPTOMS OF HYPO). 100 each 12   vitamin B-12 (CYANOCOBALAMIN) 500 MCG tablet Take 1 tablet (500 mcg total) by mouth daily. 90 tablet 1   No current facility-administered medications on file prior to visit.    Allergies  Allergen Reactions   Cymbalta [Duloxetine Hcl] Other (See Comments)    Caused anorgasmia   Doxycycline    Other Nausea And Vomiting    Mayonnaise mustard ketchup   Robaxin [Methocarbamol] Other (See Comments)    Had hiccups x 4 hours after taking    Social History   Socioeconomic History  Marital status: Significant Other    Spouse name: Not on file   Number of children: Not on file   Years of education: Not on file   Highest education level: Not on file  Occupational History   Occupation: general contractor  Tobacco Use   Smoking status: Former    Types: Cigarettes    Quit date: 10/16/1988    Years since quitting: 32.6   Smokeless tobacco: Never  Vaping Use   Vaping Use: Never used  Substance and Sexual Activity   Alcohol use: No   Drug use: Yes    Comment: rare   Sexual activity: Not on file  Other Topics Concern   Not on file  Social History Narrative   Volunteers as Airline pilot   Social Determinants of Health   Financial Resource Strain: Not on file  Food Insecurity: Not on file  Transportation Needs: Not on file  Physical Activity: Not on file  Stress: Not on file  Social Connections: Not on file  Intimate Partner Violence: Not on file    Family History  Problem Relation Age of Onset   Pancreatic cancer Mother    CAD Father    Colon cancer Paternal Grandfather     Past Surgical History:  Procedure Laterality Date   BACK SURGERY  1993   lumbar   BIOPSY  02/07/2019   Procedure: BIOPSY;  Surgeon: Danie Binder, MD;  [facility];  Service: Endoscopy;;   CERVICAL SPINE SURGERY  2000    COLONOSCOPY N/A 02/07/2019   Dr. Oneida Alar: External hemorrhoids next colonoscopy in 10 years   ESOPHAGOGASTRODUODENOSCOPY N/A 02/07/2019   Dr. Oneida Alar: Barrett's esophagus without dysplasia chronic inactive gastritis but no H. 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, nonbleeding duodenal diverticulum,   ESOPHAGOGASTRODUODENOSCOPY N/A 01/05/2020   Procedure: ESOPHAGOGASTRODUODENOSCOPY (EGD);  Surgeon: Danie Binder, MD;  [facility];  Service: Endoscopy;  Laterality: N/A;  10:30am   ESOPHAGOGASTRODUODENOSCOPY (EGD) WITH PROPOFOL N/A 12/06/2020   Procedure: ESOPHAGOGASTRODUODENOSCOPY (EGD) WITH PROPOFOL;  Surgeon: Eloise Harman, DO;  [facility];  Service: Endoscopy;  Laterality: N/A;  11:45am   GASTROJEJUNOSTOMY N/A 01/03/2021   Procedure: GASTROJEJUNOSTOMY;  Surgeon: Aviva Signs, MD;  Location: [facility];  Service: General;  Laterality: N/A;   HAND SURGERY     SAVORY DILATION N/A 01/05/2020   Procedure: SAVORY DILATION;  Surgeon: Danie Binder, MD;  [facility];  Service: Endoscopy;  Laterality: N/A;    ROS: Review of Systems He complains of allergy symptoms including runny nose and eyes.  Some sneezing and itchy throat.  He is taking Claritin  PHYSICAL EXAM: BP 115/69   Pulse 84   Resp 16   Wt 213 lb (96.6 kg)   SpO2 96%   BMI 30.56 kg/m   Wt Readings from Last 3 Encounters:  05/17/21 213 lb (96.6 kg)  05/06/21 218 lb 9.6 oz (99.2 kg)  04/27/21 216 lb 9.6 oz (98.2 kg)    Physical Exam   General appearance - alert, well appearing, middle-age to older Caucasian male and  in no distress Mental status - alert, oriented to person, place, and time, normal mood, behavior, speech, dress, motor activity, and thought processes Neck - supple, no significant adenopathy Chest - clear to auscultation, no wheezes, rales or rhonchi, symmetric air entry Heart - normal rate, regular rhythm, normal S1, S2,  no murmurs, rubs, clicks or gallops Extremities - peripheral  pulses normal, no pedal edema, no clubbing or cyanosis  CMP Latest Ref Rng & Units 03/18/2021 03/11/2021 03/09/2021  Glucose 65 - 99 mg/dL 82 210(H) 183(H)  BUN 6 - 24 mg/dL _0 Creatinine 0.76 - 1.27 mg/dL 0.82 1.03 0.86  Sodium 134 - 144 mmol/L 133(L) 130(L) 128(L)  Potassium 3.5 - 5.2 mmol/L 4.1 4.4 4.1  Chloride 96 - 106 mmol/L 98 95(L) 98  CO2 20 - 29 mmol/L _1 Calcium 8.7 - 10.2 mg/dL 8.9 8.9 8.4(L)  Total Protein 6.0 - 8.5 g/dL 6.4 - 6.8  Total Bilirubin 0.0 - 1.2 mg/dL 0.3 - 0.3  Alkaline Phos 44 - 121 IU/L 87 - 73  AST 0 - 40 IU/L 18 - 16  ALT 0 - 44 IU/L 16 - 20   Lipid Panel     Component Value Date/Time   CHOL 150 02/05/2020 1442   TRIG 168 (H) 02/05/2020 1442   HDL 40 02/05/2020 1442   CHOLHDL 3.8 02/05/2020 1442   CHOLHDL 4.8 10/17/2016 1012   VLDL NOT CALC 10/17/2016 1012   LDLCALC 81 02/05/2020 1442    CBC    Component Value Date/Time   WBC 8.0 03/28/2021 1307   WBC 10.0 03/09/2021 2008   RBC 3.02 (L) 03/28/2021 1307   HGB 8.1 (L) 03/28/2021 1307   HGB 8.2 (L) 03/18/2021 0945   HCT 26.3 (L) 03/28/2021 1307   HCT 25.9 (L) 03/18/2021 0945   PLT 253 03/28/2021 1307   PLT 296 03/18/2021 0945   MCV 87.1 03/28/2021 1307   MCV 84 03/18/2021 0945   MCH 26.8 03/28/2021 1307   MCHC 30.8 03/28/2021 1307   RDW 14.3 03/28/2021 1307   RDW 12.9 03/18/2021 0945   LYMPHSABS 1.6 03/28/2021 1307   LYMPHSABS 2.0 08/13/2019 1617   MONOABS 0.6 03/28/2021 1307   EOSABS 0.2 03/28/2021 1307   EOSABS 0.4 08/13/2019 1617   BASOSABS 0.1 03/28/2021 1307   BASOSABS 0.1 08/13/2019 1617    ASSESSMENT AND PLAN: 1. Type 2 diabetes mellitus with obesity ([place]) -Patient has lost 24 pounds since Roux-en-Y surgery that was done for pyloric stenosis.  Encourage healthy eating habits and smaller portions.  He will continue the glipizide.  We will check A1c today.  Encouraged him to check blood sugars if only once  a day.  I anticipate that he would likely need less medication given the weight loss that he has had. - POCT glucose (manual entry) - Hemoglobin O7F - Basic Metabolic Panel - CBC  2. Essential hypertension Blood pressure at home and today is good.  He continues to take the lisinopril 40 mg total daily.  Continue DASH diet.  3. Iron deficiency anemia, unspecified iron deficiency anemia type Continue oral iron supplement.  He has also been receiving iron infusion through hematology.  4. Hyponatremia Improving.  He has an appointment with endocrinology next month.  We will recheck chemistry today.  5. Seasonal allergic rhinitis, unspecified trigger Continue Claritin as needed.  I have added some Flonase nasal spray to use as needed. - fluticasone (FLONASE) 50 MCG/ACT nasal spray; Place 1 spray into both nostrils daily as needed for allergies or rhinitis.  Dispense: 16 g; Refill: 0  6. OSA on CPAP Continue daily use of CPAP.     Patient was given the opportunity to ask questions.  Patient verbalized understanding of the plan and was able to repeat key elements of the plan.   Orders Placed This Encounter  Procedures  POCT glucose (manual entry)   POCT glycosylated hemoglobin (Hb A1C)     Requested Prescriptions    No prescriptions requested or ordered in this encounter    No follow-ups on file.  Karle Plumber, MD, FACP

## 2021-05-18 ENCOUNTER — Encounter: Payer: Self-pay | Admitting: Physician Assistant

## 2021-05-18 ENCOUNTER — Other Ambulatory Visit: Payer: Self-pay

## 2021-05-18 ENCOUNTER — Encounter: Payer: Self-pay | Admitting: Internal Medicine

## 2021-05-18 LAB — CBC
Hematocrit: 31.6 % — ABNORMAL LOW (ref 37.5–51.0)
Hemoglobin: 10 g/dL — ABNORMAL LOW (ref 13.0–17.7)
MCH: 28.1 pg (ref 26.6–33.0)
MCHC: 31.6 g/dL (ref 31.5–35.7)
MCV: 89 fL (ref 79–97)
Platelets: 236 10*3/uL (ref 150–450)
RBC: 3.56 x10E6/uL — ABNORMAL LOW (ref 4.14–5.80)
RDW: 16.4 % — ABNORMAL HIGH (ref 11.6–15.4)
WBC: 6.4 10*3/uL (ref 3.4–10.8)

## 2021-05-18 LAB — BASIC METABOLIC PANEL
BUN/Creatinine Ratio: 16 (ref 9–20)
BUN: 11 mg/dL (ref 6–24)
CO2: 21 mmol/L (ref 20–29)
Calcium: 9.2 mg/dL (ref 8.7–10.2)
Chloride: 101 mmol/L (ref 96–106)
Creatinine, Ser: 0.69 mg/dL — ABNORMAL LOW (ref 0.76–1.27)
Glucose: 107 mg/dL — ABNORMAL HIGH (ref 65–99)
Potassium: 4 mmol/L (ref 3.5–5.2)
Sodium: 138 mmol/L (ref 134–144)
eGFR: 108 mL/min/{1.73_m2} (ref >59)

## 2021-05-18 LAB — HEMOGLOBIN A1C
Est. average glucose Bld gHb Est-mCnc: 114 mg/dL
Hgb A1c MFr Bld: 5.6 % (ref 4.8–5.6)

## 2021-05-19 ENCOUNTER — Other Ambulatory Visit: Payer: Self-pay | Admitting: Internal Medicine

## 2021-05-19 ENCOUNTER — Other Ambulatory Visit: Payer: Self-pay

## 2021-05-19 MED FILL — Atorvastatin Calcium Tab 80 MG (Base Equivalent): ORAL | 30 days supply | Qty: 30 | Fill #3 | Status: AC

## 2021-05-20 ENCOUNTER — Other Ambulatory Visit: Payer: Self-pay

## 2021-05-20 ENCOUNTER — Encounter: Payer: Self-pay | Admitting: Physician Assistant

## 2021-05-20 MED ORDER — PANTOPRAZOLE SODIUM 40 MG PO TBEC
DELAYED_RELEASE_TABLET | Freq: Two times a day (BID) | ORAL | 3 refills | Status: DC
  Filled 2021-05-20: qty 60, 30d supply, fill #0
  Filled 2021-09-14 – 2021-10-17 (×3): qty 60, 30d supply, fill #1
  Filled 2022-01-04 – 2022-01-09 (×2): qty 60, 30d supply, fill #0
  Filled 2022-04-14: qty 60, 30d supply, fill #1

## 2021-05-23 ENCOUNTER — Other Ambulatory Visit: Payer: Self-pay

## 2021-05-25 ENCOUNTER — Other Ambulatory Visit: Payer: Self-pay

## 2021-05-25 ENCOUNTER — Encounter: Payer: Self-pay | Admitting: Physician Assistant

## 2021-05-25 ENCOUNTER — Encounter: Payer: Self-pay | Attending: Internal Medicine | Admitting: Skilled Nursing Facility1

## 2021-05-25 DIAGNOSIS — E119 Type 2 diabetes mellitus without complications: Secondary | ICD-10-CM | POA: Insufficient documentation

## 2021-05-25 DIAGNOSIS — E669 Obesity, unspecified: Secondary | ICD-10-CM | POA: Insufficient documentation

## 2021-05-25 MED FILL — Lisinopril Tab 40 MG: ORAL | 30 days supply | Qty: 30 | Fill #4 | Status: AC

## 2021-05-25 NOTE — Progress Notes (Signed)
Medical Nutrition Therapy    Primary concerns today: to feel well  Referral diagnosis: hx RYGB Preferred learning style: auditory Learning readiness:  change in progress)   NUTRITION ASSESSMENT    Clinical Medical Hx: anemia, diabetes Medications: protonix,  Labs: A1C  5.6, RBC 3.56, hemoglobin 10, hematocrit 31.6, b12 355, vitmain D 22.5 Notable Signs/Symptoms: low energy  Lifestyle & Dietary Hx  Pt states mayonnaise makes her vomit so he avoid that. Pt states he had a pyloric stenosis in his duodenum so his doctor did the RYGB on him in February 2022. Pt states his weight was about 235 pounds in February.  Pt states his energy level is lower than he has been used to. 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  Pt states hash not had diarrhea lately but he has had it a few times and no vomiting or nausea.  Pt states since back surgery in 1992 he has been taking aleve every day. Pt stateshe spoke with his doctor about a different pain medication but the tramadol did not help enough without aleve. Pt states he noticed about an hour after eating he wants to eta again just to eat not really hungry,  Pt states he lives his fiancee.  Pt states he or his fiancee make the meals.  Pt states his lack of energy really making him feel stressed.  Pt  states he noticed if he does not use his C-PAP he will have reflux.  Pt is hypoglycemic aware.  Pt states he checks his blood sugars randomly.  Pt states beef gives him diarrhea.  Pt states he used to have a biscuits with most meals but did cut that out.   Estimated daily fluid intake: 64+ oz Supplements: potassium, b12, multivitamin, iron Sleep: 8-9 hours with rest Stress / self-care: high stress Current average weekly physical activity: ADL's  24-Hr Dietary Recall First Meal: naked drink Snack:  Second Meal: sausage egg and cheese biscuit Snack: popcicl or ice cream Third Meal:  frozen food or beef and noodles or honey BBQ chicken Snack:  Beverages: 4-5 cans dr. Reino Kent, water (not every day), gatorade    NUTRITION INTERVENTION  Nutrition education (E-1) on the following topics:  RYGB and nutrient concerns Strategies to be successful with the RYGB without micronutrient deficiencies   Handouts Provided Include  Detailed MyPlate  Learning Style & Readiness for Change Teaching method utilized: Visual & Auditory  Demonstrated degree of understanding via: Teach Back  Barriers to learning/adherence to lifestyle change: none identified   Goals Established by Pt Cut out the soda completely  Drink 5 bottles of water every day Start taking the appropriate multivitamin and calcium  Create balanced meals, include non starchy vegetables  Chew each bite until applesauce consistency; take your time when you eat

## 2021-05-30 ENCOUNTER — Other Ambulatory Visit: Payer: Self-pay

## 2021-05-30 ENCOUNTER — Encounter: Payer: Self-pay | Admitting: Internal Medicine

## 2021-05-30 ENCOUNTER — Inpatient Hospital Stay (HOSPITAL_BASED_OUTPATIENT_CLINIC_OR_DEPARTMENT_OTHER): Payer: Self-pay | Admitting: Internal Medicine

## 2021-05-30 ENCOUNTER — Inpatient Hospital Stay: Payer: Self-pay | Attending: Physician Assistant

## 2021-05-30 ENCOUNTER — Encounter: Payer: Self-pay | Admitting: Physician Assistant

## 2021-05-30 VITALS — BP 127/57 | HR 73 | Temp 97.2°F | Resp 20 | Ht 70.0 in | Wt 219.3 lb

## 2021-05-30 DIAGNOSIS — Z79899 Other long term (current) drug therapy: Secondary | ICD-10-CM | POA: Insufficient documentation

## 2021-05-30 DIAGNOSIS — D509 Iron deficiency anemia, unspecified: Secondary | ICD-10-CM | POA: Insufficient documentation

## 2021-05-30 DIAGNOSIS — D500 Iron deficiency anemia secondary to blood loss (chronic): Secondary | ICD-10-CM

## 2021-05-30 LAB — IRON AND TIBC
Iron: 62 ug/dL (ref 42–163)
Saturation Ratios: 21 % (ref 20–55)
TIBC: 293 ug/dL (ref 202–409)
UIBC: 231 ug/dL (ref 117–376)

## 2021-05-30 LAB — CBC WITH DIFFERENTIAL (CANCER CENTER ONLY)
Abs Immature Granulocytes: 0.05 10*3/uL (ref 0.00–0.07)
Basophils Absolute: 0.1 10*3/uL (ref 0.0–0.1)
Basophils Relative: 1 %
Eosinophils Absolute: 0.5 10*3/uL (ref 0.0–0.5)
Eosinophils Relative: 9 %
HCT: 32.4 % — ABNORMAL LOW (ref 39.0–52.0)
Hemoglobin: 10.2 g/dL — ABNORMAL LOW (ref 13.0–17.0)
Immature Granulocytes: 1 %
Lymphocytes Relative: 34 %
Lymphs Abs: 1.9 10*3/uL (ref 0.7–4.0)
MCH: 28.3 pg (ref 26.0–34.0)
MCHC: 31.5 g/dL (ref 30.0–36.0)
MCV: 90 fL (ref 80.0–100.0)
Monocytes Absolute: 0.6 10*3/uL (ref 0.1–1.0)
Monocytes Relative: 11 %
Neutro Abs: 2.4 10*3/uL (ref 1.7–7.7)
Neutrophils Relative %: 44 %
Platelet Count: 236 10*3/uL (ref 150–400)
RBC: 3.6 MIL/uL — ABNORMAL LOW (ref 4.22–5.81)
RDW: 16.6 % — ABNORMAL HIGH (ref 11.5–15.5)
WBC Count: 5.4 10*3/uL (ref 4.0–10.5)
nRBC: 0 % (ref 0.0–0.2)

## 2021-05-30 LAB — FERRITIN: Ferritin: 54 ng/mL (ref 24–336)

## 2021-05-30 MED FILL — Lisinopril Tab 40 MG: ORAL | 30 days supply | Qty: 30 | Fill #5 | Status: CN

## 2021-05-30 NOTE — Progress Notes (Signed)
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  PRIOR THERAPY: Iron infusion with Venofer 300 mg IV weekly for 3 weeks.  CURRENT THERAPY: Over-the-counter oral iron tablet with vitamin C.  INTERVAL HISTORY: Jeremy [name] 60 y.o. male returns to the clinic today for follow-up visit.  The patient is feeling fine today with no concerning complaints except for mild fatigue.  He mentions that he is almost 80% of his previous condition before the anemia.  He tolerated the iron infusion fairly well.  He denied having any current chest pain, shortness of breath, cough or hemoptysis.  He denied having any fever or chills.  He has no nausea, vomiting, diarrhea or constipation.  He continues on oral iron tablet twice daily.  He is here today for evaluation with repeat CBC, iron study and ferritin.  MEDICAL HISTORY: Past Medical History:  Diagnosis Date   Diabetes mellitus without complication ([facility])    GERD (gastroesophageal reflux disease)    Hyperlipidemia    Hypertension    IDA (iron deficiency anemia)    Rash of entire body 03/2016   Sleep apnea     ALLERGIES:  is allergic to cymbalta [duloxetine hcl], doxycycline, other, and robaxin [methocarbamol].  MEDICATIONS:  Current Outpatient Medications  Medication Sig Dispense Refill   acetaminophen (TYLENOL) 500 MG tablet Take 1,000 mg by mouth in the morning and at bedtime.      atorvastatin (LIPITOR) 80 MG tablet TAKE 1 TABLET (80 MG TOTAL) BY MOUTH DAILY. 90 tablet 2   Blood Glucose Monitoring Suppl (TRUE METRIX METER) w/Device KIT 1 each by Does not apply route 4 (four) times daily -  before meals and at bedtime. 1 kit 0   cyclobenzaprine (FLEXERIL) 10 MG tablet Take 1 tablet (10 mg total) by mouth 2 (two) times  daily as needed for muscle spasms. 60 tablet 3   ferrous sulfate 325 (65 FE) MG tablet Take 1 tablet (325 mg total) by mouth 2 (two) times daily with a meal. 60 tablet 2   fluticasone (FLONASE) 50 MCG/ACT nasal spray Place 1 spray into both nostrils daily as needed for allergies or rhinitis. 16 g 0   glipiZIDE (GLUCOTROL) 5 MG tablet TAKE 1 TABLET BY MOUTH IN THE MORNINGS AND 1 TABLET IN THE EVENINGS. 60 tablet 1   glucose blood test strip Use as instructed 100 each 12   lisinopril (ZESTRIL) 40 MG tablet TAKE 1 TABLET (40 MG TOTAL) BY MOUTH DAILY. 90 tablet 2   naproxen sodium (ALEVE) 220 MG tablet Take 220 mg by mouth in the morning and at bedtime.     ondansetron (ZOFRAN-ODT) 4 MG disintegrating tablet TAKE 1 TABLET (4 MG TOTAL) BY MOUTH EVERY 8 (EIGHT) HOURS AS NEEDED FOR UP TO 2 DAYS FOR NAUSEA OR VOMITING. 6 tablet 0   pantoprazole (PROTONIX) 40 MG tablet TAKE 1 TABLET (40 MG TOTAL) BY MOUTH 2 (TWO) TIMES DAILY BEFORE A MEAL. 60 tablet 3   polyethylene glycol powder (GLYCOLAX/MIRALAX) 17 GM/SCOOP powder Take 17 g by mouth daily as needed. 510 g 1   Potassium 99 MG TABS Take 99 mg by mouth 2 (two) times daily.     rOPINIRole (REQUIP) 0.5 MG tablet TAKE 1 TABLET (0.5 MG TOTAL) BY MOUTH AT BEDTIME. 90 tablet 1  sildenafil (VIAGRA) 50 MG tablet 1 TAB 1/2 HR PRIOR TO SEX. LIMIT USE TO 1 TAB/24 HRS 10 tablet 3   tamsulosin (FLOMAX) 0.4 MG CAPS capsule Take 1 capsule (0.4 mg total) by mouth daily. 30 capsule 5   traMADol (ULTRAM) 50 MG tablet Take 1 tablet (50 mg total) by mouth every 12 (twelve) hours as needed. 60 tablet 0   TRUEplus Lancets 28G MISC CHECK BLOOD SUGAR FASTING AND BEFORE MEALS AND AGAIN IF PT FEELS BAD (SYMPTOMS OF HYPO). 100 each 12   vitamin B-12 (CYANOCOBALAMIN) 500 MCG tablet Take 1 tablet (500 mcg total) by mouth daily. 90 tablet 1   No current facility-administered medications for this visit.    SURGICAL HISTORY:  Past Surgical History:  Procedure Laterality Date   BACK  SURGERY  1993   lumbar   BIOPSY  02/07/2019   Procedure: BIOPSY;  Surgeon: Danie Binder, MD;  [facility];  Service: Endoscopy;;   CERVICAL SPINE SURGERY  2000   COLONOSCOPY N/A 02/07/2019   Dr. Oneida Alar: External hemorrhoids next colonoscopy in 10 years   ESOPHAGOGASTRODUODENOSCOPY N/A 02/07/2019   Dr. Oneida Alar: Barrett's esophagus without dysplasia chronic inactive gastritis but no H. 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, nonbleeding duodenal diverticulum,   ESOPHAGOGASTRODUODENOSCOPY N/A 01/05/2020   Procedure: ESOPHAGOGASTRODUODENOSCOPY (EGD);  Surgeon: Danie Binder, MD;  [facility];  Service: Endoscopy;  Laterality: N/A;  10:30am   ESOPHAGOGASTRODUODENOSCOPY (EGD) WITH PROPOFOL N/A 12/06/2020   Procedure: ESOPHAGOGASTRODUODENOSCOPY (EGD) WITH PROPOFOL;  Surgeon: Eloise Harman, DO;  [facility];  Service: Endoscopy;  Laterality: N/A;  11:45am   GASTROJEJUNOSTOMY N/A 01/03/2021   Procedure: GASTROJEJUNOSTOMY;  Surgeon: Aviva Signs, MD;  Location[facility];  Service: General;  Laterality: N/A;   HAND SURGERY     SAVORY DILATION N/A 01/05/2020   Procedure: SAVORY DILATION;  Surgeon: Danie Binder, MD;  [facility];  Service: Endoscopy;  Laterality: N/A;    REVIEW OF SYSTEMS:  A comprehensive review of systems was negative except for: Constitutional: positive for fatigue   PHYSICAL EXAMINATION: General appearance: alert, cooperative, fatigued, and no distress Head: Normocephalic, without obvious abnormality, atraumatic Neck: no adenopathy, no JVD, supple, symmetrical, trachea midline, and thyroid not enlarged, symmetric, no tenderness/mass/nodules Lymph nodes: Cervical, supraclavicular, and axillary nodes normal. Resp: clear to auscultation bilaterally Back: symmetric, no curvature. ROM normal. No CVA tenderness. Cardio: regular rate and rhythm, S1, S2 normal, no murmur, click, rub  or gallop GI: soft, non-tender; bowel sounds normal; no masses,  no organomegaly Extremities: extremities normal, atraumatic, no cyanosis or edema  ECOG PERFORMANCE STATUS: 1 - Symptomatic but completely ambulatory  Blood pressure (!) 127/57, pulse 73, temperature (!) 97.2 F (36.2 C), temperature source Tympanic, resp. rate 20, height 5' 10"  (1.778 m), weight 219 lb 4.8 oz (99.5 kg), SpO2 100 %.  LABORATORY DATA: Lab Results  Component Value Date   WBC 5.4 05/30/2021   HGB 10.2 (L) 05/30/2021   HCT 32.4 (L) 05/30/2021   MCV 90.0 05/30/2021   PLT 236 05/30/2021      Chemistry      Component Value Date/Time   NA 138 05/17/2021 1657   K 4.0 05/17/2021 1657   CL 101 05/17/2021 1657   CO2 21 05/17/2021 1657   BUN 11 05/17/2021 1657   CREATININE 0.69 (L) 05/17/2021 1657   CREATININE 0.72 10/17/2016 1012      Component Value  Date/Time   CALCIUM 9.2 05/17/2021 1657   ALKPHOS 87 03/18/2021 0945   AST 18 03/18/2021 0945   ALT 16 03/18/2021 0945   BILITOT 0.3 03/18/2021 0945       RADIOGRAPHIC STUDIES: No results found.  ASSESSMENT AND PLAN: This is a very pleasant 60 years old white male with iron deficiency anemia secondary to malabsorption secondary to gastrojejunostomy in March 2022. The patient was treated with iron infusion with Venofer 300 mg IV for 3 doses. He tolerated the infusion fairly well. He is also on maintenance treatment with oral iron tablets twice daily. CBC today showed improvement of his hemoglobin to 10.2.  Iron study and ferritin are still pending. I recommended for the patient to continue on the oral iron tablets for now and I will see him back for follow-up visit in 3 months. If the pending iron studies showed significant iron deficiency, I will arrange for the patient to receive iron infusion with Venofer in the interval. He was advised to call immediately if he has any other concerning symptoms in the interval. The patient voices understanding of  current disease status and treatment options and is in agreement with the current care plan.  All questions were answered. The patient knows to call the clinic with any problems, questions or concerns. We can certainly see the patient much sooner if necessary.  The total time spent in the appointment was 20 minutes.  Disclaimer: This note was dictated with voice recognition software. Similar sounding words can inadvertently be transcribed and may not be corrected upon review.

## 2021-06-01 ENCOUNTER — Other Ambulatory Visit: Payer: Self-pay

## 2021-06-01 ENCOUNTER — Encounter: Payer: Self-pay | Admitting: Physician Assistant

## 2021-06-01 MED FILL — Lisinopril Tab 40 MG: ORAL | 30 days supply | Qty: 30 | Fill #5 | Status: AC

## 2021-06-03 ENCOUNTER — Other Ambulatory Visit: Payer: Self-pay

## 2021-06-06 ENCOUNTER — Other Ambulatory Visit: Payer: Self-pay

## 2021-06-06 ENCOUNTER — Encounter: Payer: Self-pay | Admitting: Physician Assistant

## 2021-06-10 ENCOUNTER — Other Ambulatory Visit: Payer: Self-pay

## 2021-06-13 ENCOUNTER — Other Ambulatory Visit: Payer: Self-pay | Admitting: Family Medicine

## 2021-06-13 ENCOUNTER — Other Ambulatory Visit: Payer: Self-pay | Admitting: Internal Medicine

## 2021-06-13 DIAGNOSIS — E119 Type 2 diabetes mellitus without complications: Secondary | ICD-10-CM

## 2021-06-13 MED ORDER — GLIPIZIDE 5 MG PO TABS
ORAL_TABLET | ORAL | 2 refills | Status: DC
Start: 2021-06-13 — End: 2021-10-16
  Filled 2021-06-13: qty 60, fill #0
  Filled 2021-07-11: qty 60, 30d supply, fill #0
  Filled 2021-08-15: qty 60, 30d supply, fill #1
  Filled 2021-09-17: qty 60, 30d supply, fill #2
  Filled ????-??-??: fill #2
  Filled ????-??-??: fill #1

## 2021-06-13 NOTE — Telephone Encounter (Signed)
Requested medications are due for refill today.  Seems a little early  Requested medications are on the active medications list.  yes  Last refill. 05/03/2021  Future visit scheduled.   no  Notes to clinic.  Medication no delegated.

## 2021-06-13 NOTE — Telephone Encounter (Signed)
Requested Prescriptions  Pending Prescriptions Disp Refills  . glipiZIDE (GLUCOTROL) 5 MG tablet 60 tablet 2    Sig: TAKE 1 TABLET BY MOUTH IN THE MORNINGS AND 1 TABLET IN THE EVENINGS.     Endocrinology:  Diabetes - Sulfonylureas Passed - 06/13/2021  9:37 PM      Passed - HBA1C is between 0 and 7.9 and within 180 days    HbA1c, POC (prediabetic range)  Date Value Ref Range Status  09/05/2019 6.3 5.7 - 6.4 % Corrected   HbA1c, POC (controlled diabetic range)  Date Value Ref Range Status  12/21/2020 6.7 0.0 - 7.0 % Final   Hgb A1c MFr Bld  Date Value Ref Range Status  05/17/2021 5.6 4.8 - 5.6 % Final    Comment:             Prediabetes: 5.7 - 6.4          Diabetes: >6.4          Glycemic control for adults with diabetes: <7.0          Passed - Valid encounter within last 6 months    Recent Outpatient Visits          3 weeks ago Type 2 diabetes mellitus with obesity (HCC)   [facility] Marcine Matar, MD   2 months ago Dizziness   [facility] Marcine Matar, MD   5 months ago Preoperative evaluation to rule out surgical contraindication   [facility] Marcine Matar, MD   7 months ago Acute pain of right knee   [facility] Jonah Blue B, MD   9 months ago Type 2 diabetes mellitus with diabetic polyneuropathy, without long-term current use of insulin [facility])    [facility] Marcine Matar, MD

## 2021-06-14 ENCOUNTER — Other Ambulatory Visit: Payer: Self-pay

## 2021-06-14 ENCOUNTER — Encounter: Payer: Self-pay | Admitting: Physician Assistant

## 2021-06-15 ENCOUNTER — Other Ambulatory Visit: Payer: Self-pay

## 2021-06-15 ENCOUNTER — Encounter: Payer: Self-pay | Admitting: Physician Assistant

## 2021-06-15 MED ORDER — TRAMADOL HCL 50 MG PO TABS
50.0000 mg | ORAL_TABLET | Freq: Two times a day (BID) | ORAL | 0 refills | Status: DC | PRN
  Filled 2021-06-15 – 2021-06-23 (×2): qty 60, 30d supply, fill #0

## 2021-06-20 MED FILL — Atorvastatin Calcium Tab 80 MG (Base Equivalent): ORAL | 30 days supply | Qty: 30 | Fill #4 | Status: AC

## 2021-06-21 ENCOUNTER — Other Ambulatory Visit: Payer: Self-pay

## 2021-06-22 ENCOUNTER — Other Ambulatory Visit: Payer: Self-pay

## 2021-06-23 ENCOUNTER — Other Ambulatory Visit: Payer: Self-pay

## 2021-06-23 ENCOUNTER — Ambulatory Visit (INDEPENDENT_AMBULATORY_CARE_PROVIDER_SITE_OTHER): Payer: Self-pay | Admitting: Endocrinology

## 2021-06-23 ENCOUNTER — Encounter: Payer: Self-pay | Admitting: Physician Assistant

## 2021-06-23 DIAGNOSIS — E871 Hypo-osmolality and hyponatremia: Secondary | ICD-10-CM

## 2021-06-23 LAB — BASIC METABOLIC PANEL
BUN: 17 mg/dL (ref 6–23)
CO2: 24 mEq/L (ref 19–32)
Calcium: 9.1 mg/dL (ref 8.4–10.5)
Chloride: 108 mEq/L (ref 96–112)
Creatinine, Ser: 0.84 mg/dL (ref 0.40–1.50)
GFR: 96.46 mL/min (ref >60.00)
Glucose, Bld: 110 mg/dL — ABNORMAL HIGH (ref 70–99)
Potassium: 3.9 mEq/L (ref 3.5–5.1)
Sodium: 139 mEq/L (ref 135–145)

## 2021-06-23 LAB — BRAIN NATRIURETIC PEPTIDE: Pro B Natriuretic peptide (BNP): 10 pg/mL (ref 0.0–100.0)

## 2021-06-23 LAB — CORTISOL: Cortisol, Plasma: 10.3 ug/dL

## 2021-06-23 NOTE — Patient Instructions (Addendum)
Blood tests are requested for you today.  We'll let you know about the results.   Depending on the results, I will prescribe for you a diuretic, salt pills, or both.   Please come back for a follow-up appointment in 2 months.

## 2021-06-23 NOTE — Progress Notes (Signed)
Subjective:    Patient ID: Jeremy [name], male    DOB: Apr 02, 1961, 60 y.o.   MRN: [id number]  HPI Pt is referred by Dr Wynetta Emery, for hyponatremia.  Pt was first noted to have hyponatremia in 2019.  He has no h/o diuretic, adrenal insuff, SIADH, cirrhosis, CHF, hypothyroidism, nephrotic synd, brain injury, hypertriglyceridemia, psychogenic polydipsia, pulm dz, chemotx, narcotics, SSRI, and GBS.  He has never had pituitary imaging.  He has orange card.  He has lost 20 lbs since abd surg 4/22. Past Medical History:  Diagnosis Date   Diabetes mellitus without complication (HCC)    GERD (gastroesophageal reflux disease)    Hyperlipidemia    Hypertension    IDA (iron deficiency anemia)    Rash of entire body 03/2016   Sleep apnea     Past Surgical History:  Procedure Laterality Date   BACK SURGERY  1993   lumbar   BIOPSY  02/07/2019   Procedure: BIOPSY;  Surgeon: Danie Binder, MD;  [facility];  Service: Endoscopy;;   CERVICAL SPINE SURGERY  2000   COLONOSCOPY N/A 02/07/2019   Dr. Oneida Alar: External hemorrhoids next colonoscopy in 10 years   ESOPHAGOGASTRODUODENOSCOPY N/A 02/07/2019   Dr. Oneida Alar: Barrett's esophagus without dysplasia chronic inactive gastritis but no H. 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, nonbleeding duodenal diverticulum,   ESOPHAGOGASTRODUODENOSCOPY N/A 01/05/2020   Procedure: ESOPHAGOGASTRODUODENOSCOPY (EGD);  Surgeon: Danie Binder, MD;  [facility];  Service: Endoscopy;  Laterality: N/A;  10:30am   ESOPHAGOGASTRODUODENOSCOPY (EGD) WITH PROPOFOL N/A 12/06/2020   Procedure: ESOPHAGOGASTRODUODENOSCOPY (EGD) WITH PROPOFOL;  Surgeon: Eloise Harman, DO;  [facility];  Service: Endoscopy;  Laterality: N/A;  11:45am   GASTROJEJUNOSTOMY N/A 01/03/2021   Procedure: GASTROJEJUNOSTOMY;  Surgeon: Aviva Signs, MD;  Location: [facility];  Service: General;  Laterality: N/A;   HAND  SURGERY     SAVORY DILATION N/A 01/05/2020   Procedure: SAVORY DILATION;  Surgeon: Danie Binder, MD;  [facility];  Service: Endoscopy;  Laterality: N/A;    Social History   Socioeconomic History   Marital status: Significant Other    Spouse name: Not on file   Number of children: Not on file   Years of education: Not on file   Highest education level: Not on file  Occupational History   Occupation: general contractor  Tobacco Use   Smoking status: Former    Types: Cigarettes    Quit date: 10/16/1988    Years since quitting: 32.7   Smokeless tobacco: Never  Vaping Use   Vaping Use: Never used  Substance and Sexual Activity   Alcohol use: No   Drug use: Yes    Comment: rare   Sexual activity: Not on file  Other Topics Concern   Not on file  Social History Narrative   Volunteers as Airline pilot   Social Determinants of Health   Financial Resource Strain: Not on file  Food Insecurity: Not on file  Transportation Needs: Not on file  Physical Activity: Not on file  Stress: Not on file  Social Connections: Not on file  Intimate Partner Violence: Not on file    Current Outpatient Medications on File Prior to Visit  Medication Sig Dispense Refill   acetaminophen (TYLENOL) 500 MG tablet Take 1,000 mg by mouth in the morning and at bedtime.      atorvastatin (LIPITOR) 80 MG tablet TAKE 1 TABLET (80  MG TOTAL) BY MOUTH DAILY. 90 tablet 2   Blood Glucose Monitoring Suppl (TRUE METRIX METER) w/Device KIT 1 each by Does not apply route 4 (four) times daily -  before meals and at bedtime. 1 kit 0   cyclobenzaprine (FLEXERIL) 10 MG tablet Take 1 tablet (10 mg total) by mouth 2 (two) times daily as needed for muscle spasms. 60 tablet 3   ferrous sulfate 325 (65 FE) MG tablet Take 1 tablet (325 mg total) by mouth 2 (two) times daily with a meal. 60 tablet 2   fluticasone (FLONASE) 50 MCG/ACT nasal spray Place 1 spray into both nostrils daily as needed for allergies or  rhinitis. 16 g 0   glipiZIDE (GLUCOTROL) 5 MG tablet TAKE 1 TABLET BY MOUTH IN THE MORNINGS AND 1 TABLET IN THE EVENINGS. 60 tablet 2   glucose blood test strip Use as instructed 100 each 12   lisinopril (ZESTRIL) 40 MG tablet TAKE 1 TABLET (40 MG TOTAL) BY MOUTH DAILY. (Patient taking differently: Take 40 mg by mouth daily.) 90 tablet 2   naproxen sodium (ALEVE) 220 MG tablet Take 220 mg by mouth in the morning and at bedtime.     ondansetron (ZOFRAN-ODT) 4 MG disintegrating tablet TAKE 1 TABLET (4 MG TOTAL) BY MOUTH EVERY 8 (EIGHT) HOURS AS NEEDED FOR UP TO 2 DAYS FOR NAUSEA OR VOMITING. 6 tablet 0   pantoprazole (PROTONIX) 40 MG tablet TAKE 1 TABLET (40 MG TOTAL) BY MOUTH 2 (TWO) TIMES DAILY BEFORE A MEAL. 60 tablet 3   polyethylene glycol powder (GLYCOLAX/MIRALAX) 17 GM/SCOOP powder Take 17 g by mouth daily as needed. 510 g 1   Potassium 99 MG TABS Take 99 mg by mouth 2 (two) times daily.     rOPINIRole (REQUIP) 0.5 MG tablet TAKE 1 TABLET (0.5 MG TOTAL) BY MOUTH AT BEDTIME. 90 tablet 1   sildenafil (VIAGRA) 50 MG tablet 1 TAB 1/2 HR PRIOR TO SEX. LIMIT USE TO 1 TAB/24 HRS 10 tablet 3   tamsulosin (FLOMAX) 0.4 MG CAPS capsule Take 1 capsule (0.4 mg total) by mouth daily. 30 capsule 5   traMADol (ULTRAM) 50 MG tablet Take 1 tablet (50 mg total) by mouth every 12 (twelve) hours as needed. 60 tablet 0   TRUEplus Lancets 28G MISC CHECK BLOOD SUGAR FASTING AND BEFORE MEALS AND AGAIN IF PT FEELS BAD (SYMPTOMS OF HYPO). 100 each 12   vitamin B-12 (CYANOCOBALAMIN) 500 MCG tablet Take 1 tablet (500 mcg total) by mouth daily. 90 tablet 1   No current facility-administered medications on file prior to visit.    Allergies  Allergen Reactions   Cymbalta [Duloxetine Hcl] Other (See Comments)    Caused anorgasmia   Doxycycline    Other Nausea And Vomiting    Mayonnaise mustard ketchup   Robaxin [Methocarbamol] Other (See Comments)    Had hiccups x 4 hours after taking    Family History  Problem  Relation Age of Onset   Pancreatic cancer Mother    CAD Father    Colon cancer Paternal Grandfather     BP (!) 110/40 (BP Location: Right Arm, Patient Position: Sitting, Cuff Size: Large)   Pulse 75   Ht _0  (1.778 m)   Wt 217 lb 3.2 oz (98.5 kg)   SpO2 98%   BMI 31.16 kg/m    Review of Systems Denies numbness, visual loss, sob, palpitations, diarrhea, polyuria, memory loss, and headache.  He has chronic low back pain.  Nausea is less  now.      Objective:   Physical Exam VS: see vs page GEN: no distress HEAD: head: no deformity eyes: no periorbital swelling, no proptosis external nose and ears are normal NECK: supple, thyroid is not enlarged CHEST WALL: no deformity LUNGS: clear to auscultation CV: reg rate and rhythm, no murmur.  MUSCULOSKELETAL: gait is normal and steady EXTEMITIES: no deformity.  Trace bilat leg edema NEURO:  readily moves all 4's.  sensation is intact to touch on all 4's SKIN:  Normal texture and temperature.  No rash or suspicious lesion is visible.   NODES:  None palpable at the neck PSYCH: alert, well-oriented.  Does not appear anxious nor depressed.    Lab Results  Component Value Date   CREATININE 0.69 (L) 05/17/2021   BUN 11 05/17/2021   NA 138 05/17/2021   K 4.0 05/17/2021   CL 101 05/17/2021   CO2 21 05/17/2021   Lab Results  Component Value Date   TSH 1.970 03/18/2021   Lab Results  Component Value Date   CHOL 150 02/05/2020   HDL 40 02/05/2020   LDLCALC 81 02/05/2020   TRIG 168 (H) 02/05/2020   CHOLHDL 3.8 02/05/2020   Lab Results  Component Value Date   ALT 16 03/18/2021   AST 18 03/18/2021   ALKPHOS 87 03/18/2021   BILITOT 0.3 03/18/2021    CT (2021): adrenals are normal  I have reviewed outside records, and summarized: Pt was noted to have low sodium, and referred here.  He had extensive abd surgery in early 2022.  Since then, pt had has several vit/mineral deficiencies     Assessment & Plan:  Hyponatremia,  new to me, uncertain etiology and prognosis.  Lisinopril is possible, but there is no need to stop it.     Patient Instructions  Blood tests are requested for you today.  We'll let you know about the results.   Depending on the results, I will prescribe for you a diuretic, salt pills, or both.   Please come back for a follow-up appointment in 2 months.

## 2021-06-24 LAB — URINALYSIS, ROUTINE W REFLEX MICROSCOPIC
Bilirubin Urine: NEGATIVE
Hgb urine dipstick: NEGATIVE
Ketones, ur: NEGATIVE
Leukocytes,Ua: NEGATIVE
Nitrite: NEGATIVE
RBC / HPF: NONE SEEN (ref 0–?)
Specific Gravity, Urine: >=1.03 — AB (ref 1.000–1.030)
Total Protein, Urine: NEGATIVE
Urine Glucose: NEGATIVE
Urobilinogen, UA: 0.2 (ref 0.0–1.0)
WBC, UA: NONE SEEN (ref 0–?)
pH: 6 (ref 5.0–8.0)

## 2021-06-26 LAB — ACTH: C206 ACTH: 26 pg/mL (ref 6–50)

## 2021-06-27 ENCOUNTER — Other Ambulatory Visit: Payer: Self-pay

## 2021-06-28 ENCOUNTER — Encounter: Payer: Self-pay | Admitting: Physician Assistant

## 2021-06-28 ENCOUNTER — Other Ambulatory Visit: Payer: Self-pay

## 2021-06-29 ENCOUNTER — Encounter: Payer: Self-pay | Admitting: Internal Medicine

## 2021-06-29 ENCOUNTER — Telehealth: Payer: Self-pay | Admitting: Family Medicine

## 2021-06-29 ENCOUNTER — Other Ambulatory Visit: Payer: Self-pay

## 2021-06-29 NOTE — Telephone Encounter (Signed)
Received medical records release form from patient      Forwarding to medical records

## 2021-06-30 NOTE — Progress Notes (Signed)
..  Patient Assist/Replace for the following has been terminated. Medication: Venofer (iron sucrose) Reason for Termination: Program expired 06/30/21 and was temporary to allow patient to apply for medicaid or insurance coverage. Last DOS: 05/13/2021. Marland KitchenDarlyne Russian, CPhT IV Drug Replacement Specialist [facility] Phone: [phone]

## 2021-07-04 ENCOUNTER — Other Ambulatory Visit: Payer: Self-pay | Admitting: Cardiology

## 2021-07-05 ENCOUNTER — Other Ambulatory Visit: Payer: Self-pay

## 2021-07-05 ENCOUNTER — Encounter: Payer: Self-pay | Admitting: Physician Assistant

## 2021-07-11 ENCOUNTER — Encounter: Payer: Self-pay | Admitting: Internal Medicine

## 2021-07-12 ENCOUNTER — Encounter: Payer: Self-pay | Admitting: Physician Assistant

## 2021-07-12 ENCOUNTER — Other Ambulatory Visit: Payer: Self-pay

## 2021-07-13 ENCOUNTER — Encounter: Payer: Self-pay | Admitting: Physician Assistant

## 2021-07-13 ENCOUNTER — Other Ambulatory Visit: Payer: Self-pay

## 2021-07-13 ENCOUNTER — Other Ambulatory Visit: Payer: Self-pay | Admitting: Internal Medicine

## 2021-07-13 ENCOUNTER — Other Ambulatory Visit: Payer: Self-pay | Admitting: Nurse Practitioner

## 2021-07-13 MED ORDER — LISINOPRIL 40 MG PO TABS
40.0000 mg | ORAL_TABLET | Freq: Every day | ORAL | 0 refills | Status: DC
  Filled 2021-07-13: qty 30, 30d supply, fill #0
  Filled 2021-08-15: qty 30, 30d supply, fill #1
  Filled 2021-09-17: qty 30, 30d supply, fill #2
  Filled ????-??-??: fill #1
  Filled ????-??-??: fill #2

## 2021-07-15 LAB — ARGININE VASOPRESSIN HORMONE
ADH: 1 pg/mL (ref 0.0–4.7)
Osmolality Meas: 285 mOsmol/kg (ref 275–295)

## 2021-07-16 ENCOUNTER — Other Ambulatory Visit: Payer: Self-pay | Admitting: Cardiology

## 2021-07-16 ENCOUNTER — Other Ambulatory Visit: Payer: Self-pay | Admitting: Internal Medicine

## 2021-07-16 DIAGNOSIS — D509 Iron deficiency anemia, unspecified: Secondary | ICD-10-CM

## 2021-07-16 MED ORDER — FERROUS SULFATE 325 (65 FE) MG PO TABS
325.0000 mg | ORAL_TABLET | Freq: Two times a day (BID) | ORAL | 1 refills | Status: DC
  Filled 2021-08-08 – 2021-08-15 (×2): qty 60, 30d supply, fill #0
  Filled 2021-09-17: qty 60, 30d supply, fill #1

## 2021-07-16 MED ORDER — TAMSULOSIN HCL 0.4 MG PO CAPS
0.4000 mg | ORAL_CAPSULE | Freq: Every day | ORAL | 0 refills | Status: DC
  Filled 2021-07-25: qty 30, 30d supply, fill #0
  Filled 2021-08-21: qty 30, 30d supply, fill #1
  Filled 2021-10-01: qty 30, 30d supply, fill #2

## 2021-07-16 NOTE — Telephone Encounter (Signed)
Requested medication (s) are due for refill today: yes  Requested medication (s) are on the active medication list: yes  Last refill:  06/15/21 #60  Future visit scheduled: yes  Notes to clinic:  med not delegated to NT to RF   Requested Prescriptions  Pending Prescriptions Disp Refills   traMADol (ULTRAM) 50 MG tablet 60 tablet     Sig: Take 1 tablet (50 mg total) by mouth every 12 (twelve) hours as needed.     Not Delegated - Analgesics:  Opioid Agonists Failed - 07/16/2021  2:08 PM      Failed - This refill cannot be delegated      Failed - Urine Drug Screen completed in last 360 days      Passed - Valid encounter within last 6 months    Recent Outpatient Visits           2 months ago Type 2 diabetes mellitus with obesity (HCC)   [facility] Marcine Matar, MD   4 months ago Dizziness   [facility] Marcine Matar, MD   6 months ago Preoperative evaluation to rule out surgical contraindication   [facility] Marcine Matar, MD   8 months ago Acute pain of right knee   [facility] Jonah Blue B, MD   10 months ago Type 2 diabetes mellitus with diabetic polyneuropathy, without long-term current use of insulin [facility])   [facility] Marcine Matar, MD       Future Appointments             In 1 month Marcine Matar, MD [facility]            Signed Prescriptions Disp Refills   tamsulosin (FLOMAX) 0.4 MG CAPS capsule 90 capsule 0    Sig: Take 1 capsule (0.4 mg total) by mouth daily.     Urology: Alpha-Adrenergic Blocker Failed - 07/16/2021  2:08 PM      Failed - Last BP in normal range    BP Readings from Last 1 Encounters:  06/23/21 (!) 110/40          Passed - Valid encounter within last 12 months    Recent Outpatient Visits           2 months ago Type 2  diabetes mellitus with obesity (HCC)   [facility] Marcine Matar, MD   4 months ago Dizziness   [place] [facility] Marcine Matar, MD   6 months ago Preoperative evaluation to rule out surgical contraindication   [facility] Marcine Matar, MD   8 months ago Acute pain of right knee   [facility] Marcine Matar, MD   10 months ago Type 2 diabetes mellitus with diabetic polyneuropathy, without long-term current use of insulin [facility])   [facility] Marcine Matar, MD       Future Appointments             In 1 month Marcine Matar, MD [facility]             ferrous sulfate (FEROSUL) 325 (65 FE) MG tablet 60 tablet 1    Sig: Take 1 tablet (325 mg total) by mouth 2 (  two) times daily with a meal.     Endocrinology:  Minerals - Iron Supplementation Failed - 07/16/2021  2:08 PM      Failed - HGB in normal range and within 360 days    Hemoglobin  Date Value Ref Range Status  05/30/2021 10.2 (L) 13.0 - 17.0 g/dL Final  93/57/0177 93.9 (L) 13.0 - 17.7 g/dL Final          Failed - HCT in normal range and within 360 days    HCT  Date Value Ref Range Status  05/30/2021 32.4 (L) 39.0 - 52.0 % Final   Hematocrit  Date Value Ref Range Status  05/17/2021 31.6 (L) 37.5 - 51.0 % Final          Failed - RBC in normal range and within 360 days    RBC  Date Value Ref Range Status  05/30/2021 3.60 (L) 4.22 - 5.81 MIL/uL Final          Passed - Fe (serum) in normal range and within 360 days    Iron  Date Value Ref Range Status  05/30/2021 62 42 - 163 ug/dL Final  03/00/9233 18 (L) 38 - 169 ug/dL Final   Iron Saturation  Date Value Ref Range Status  03/11/2021 6 (LL) 15 - 55 % Final   Saturation Ratios  Date Value Ref Range Status  05/30/2021 21 20 - 55 % Final          Passed -  Ferritin in normal range and within 360 days    Ferritin  Date Value Ref Range Status  05/30/2021 54 24 - 336 ng/mL Final    Comment:    Performed at [facility], [address] [place], [place] [address]  03/11/2021 23 (L) 30 - 400 ng/mL Final          Passed - Valid encounter within last 12 months    Recent Outpatient Visits           2 months ago Type 2 diabetes mellitus with obesity [facility])   [place] [facility] Marcine Matar, MD   4 months ago Dizziness   [facility] Marcine Matar, MD   6 months ago Preoperative evaluation to rule out surgical contraindication   [facility] Marcine Matar, MD   8 months ago Acute pain of right knee   [facility] Marcine Matar, MD   10 months ago Type 2 diabetes mellitus with diabetic polyneuropathy, without long-term current use of insulin [facility])   [facility] Marcine Matar, MD       Future Appointments             In 1 month Laural Benes Binnie Rail, MD [facility]

## 2021-07-17 ENCOUNTER — Other Ambulatory Visit: Payer: Self-pay | Admitting: Endocrinology

## 2021-07-17 MED ORDER — SODIUM CHLORIDE 1 G PO TABS
2.0000 g | ORAL_TABLET | Freq: Every day | ORAL | 3 refills | Status: DC
  Filled 2021-07-17: qty 60, 30d supply, fill #0
  Filled 2021-08-21: qty 60, 30d supply, fill #1
  Filled 2021-10-16 – 2021-10-17 (×2): qty 60, 30d supply, fill #2
  Filled 2021-12-20 (×2): qty 60, 30d supply, fill #0
  Filled 2022-03-11: qty 60, 30d supply, fill #1

## 2021-07-18 ENCOUNTER — Encounter: Payer: Self-pay | Admitting: Physician Assistant

## 2021-07-18 ENCOUNTER — Other Ambulatory Visit: Payer: Self-pay

## 2021-07-18 MED ORDER — ATORVASTATIN CALCIUM 80 MG PO TABS
ORAL_TABLET | Freq: Every day | ORAL | 2 refills | Status: DC
  Filled 2021-07-18: qty 30, 30d supply, fill #0
  Filled 2021-08-21: qty 30, 30d supply, fill #1
  Filled 2021-09-29: qty 30, 30d supply, fill #2

## 2021-07-18 MED ORDER — TRAMADOL HCL 50 MG PO TABS
50.0000 mg | ORAL_TABLET | Freq: Two times a day (BID) | ORAL | 0 refills | Status: DC | PRN
  Filled 2021-07-18 – 2021-07-23 (×2): qty 60, 30d supply, fill #0

## 2021-07-19 ENCOUNTER — Other Ambulatory Visit: Payer: Self-pay

## 2021-07-25 ENCOUNTER — Encounter: Payer: Self-pay | Admitting: Physician Assistant

## 2021-07-25 ENCOUNTER — Other Ambulatory Visit: Payer: Self-pay

## 2021-07-26 ENCOUNTER — Other Ambulatory Visit: Payer: Self-pay

## 2021-07-27 ENCOUNTER — Encounter: Payer: Self-pay | Admitting: Internal Medicine

## 2021-07-27 ENCOUNTER — Other Ambulatory Visit: Payer: Self-pay | Admitting: Internal Medicine

## 2021-07-27 ENCOUNTER — Other Ambulatory Visit: Payer: Self-pay

## 2021-08-08 ENCOUNTER — Encounter: Payer: Self-pay | Admitting: Physician Assistant

## 2021-08-08 ENCOUNTER — Other Ambulatory Visit: Payer: Self-pay

## 2021-08-15 ENCOUNTER — Other Ambulatory Visit: Payer: Self-pay

## 2021-08-15 ENCOUNTER — Encounter: Payer: Self-pay | Admitting: Physician Assistant

## 2021-08-16 ENCOUNTER — Encounter: Payer: Self-pay | Admitting: Endocrinology

## 2021-08-16 ENCOUNTER — Other Ambulatory Visit: Payer: Self-pay

## 2021-08-17 ENCOUNTER — Encounter: Payer: Self-pay | Admitting: Internal Medicine

## 2021-08-21 ENCOUNTER — Other Ambulatory Visit: Payer: Self-pay | Admitting: Internal Medicine

## 2021-08-21 DIAGNOSIS — J302 Other seasonal allergic rhinitis: Secondary | ICD-10-CM

## 2021-08-22 ENCOUNTER — Encounter: Payer: Self-pay | Admitting: Internal Medicine

## 2021-08-22 ENCOUNTER — Other Ambulatory Visit: Payer: Self-pay

## 2021-08-22 ENCOUNTER — Encounter: Payer: Self-pay | Admitting: Physician Assistant

## 2021-08-22 MED ORDER — ONDANSETRON HCL 8 MG PO TABS
8.0000 mg | ORAL_TABLET | Freq: Every day | ORAL | 0 refills | Status: DC | PRN
  Filled 2021-08-22: qty 30, 30d supply, fill #0

## 2021-08-22 MED ORDER — FLUTICASONE PROPIONATE 50 MCG/ACT NA SUSP
1.0000 | Freq: Every day | NASAL | 0 refills | Status: DC | PRN
  Filled 2021-08-22: qty 16, 30d supply, fill #0

## 2021-08-22 MED ORDER — TRAMADOL HCL 50 MG PO TABS
50.0000 mg | ORAL_TABLET | Freq: Two times a day (BID) | ORAL | 2 refills | Status: DC | PRN
  Filled 2021-08-22: qty 60, 30d supply, fill #0
  Filled 2021-09-29: qty 60, 30d supply, fill #1
  Filled 2021-10-30 – 2021-11-07 (×2): qty 60, 30d supply, fill #0

## 2021-08-22 NOTE — Telephone Encounter (Signed)
Requested medication (s) are due for refill today:   Provider to review Yes  Requested medication (s) are on the active medication list:   Yes  Future visit scheduled:   Yes   Last ordered: 07/18/2021 #60, 0 refills  Non delegated refill   Requested Prescriptions  Pending Prescriptions Disp Refills   traMADol (ULTRAM) 50 MG tablet 60 tablet 0    Sig: Take 1 tablet (50 mg total) by mouth every 12 (twelve) hours as needed (to fill on or after 07/21/2021).     Not Delegated - Analgesics:  Opioid Agonists Failed - 08/21/2021  7:20 PM      Failed - This refill cannot be delegated      Failed - Urine Drug Screen completed in last 360 days      Passed - Valid encounter within last 6 months    Recent Outpatient Visits           3 months ago Type 2 diabetes mellitus with obesity (HCC)   [facility] Marcine Matar, MD   5 months ago Dizziness   [facility] Marcine Matar, MD   8 months ago Preoperative evaluation to rule out surgical contraindication   [facility] Marcine Matar, MD   9 months ago Acute pain of right knee   [facility] Jonah Blue B, MD   11 months ago Type 2 diabetes mellitus with diabetic polyneuropathy, without long-term current use of insulin [facility])   [facility] Marcine Matar, MD       Future Appointments             In 1 week Marcine Matar, MD [facility]             fluticasone [facility]) 50 MCG/ACT nasal spray 16 g 0    Sig: Place 1 spray into both nostrils daily as needed for allergies or rhinitis.     Ear, Nose, and Throat: Nasal Preparations - Corticosteroids Passed - 08/21/2021  7:20 PM      Passed - Valid encounter within last 12 months    Recent Outpatient Visits           3 months ago Type 2 diabetes mellitus with obesity (HCC)   [facility] Marcine Matar, MD   5 months ago Dizziness   [facility] Marcine Matar, MD   8 months ago Preoperative evaluation to rule out surgical contraindication   [facility] Marcine Matar, MD   9 months ago Acute pain of right knee   [facility] Marcine Matar, MD   11 months ago Type 2 diabetes mellitus with diabetic polyneuropathy, without long-term current use of insulin [facility])   [facility] Marcine Matar, MD       Future Appointments             In 1 week Marcine Matar, MD [facility]

## 2021-08-23 ENCOUNTER — Encounter: Payer: Self-pay | Admitting: Physician Assistant

## 2021-08-23 ENCOUNTER — Other Ambulatory Visit: Payer: Self-pay

## 2021-08-25 ENCOUNTER — Other Ambulatory Visit: Payer: Self-pay

## 2021-08-25 ENCOUNTER — Ambulatory Visit (INDEPENDENT_AMBULATORY_CARE_PROVIDER_SITE_OTHER): Payer: Self-pay | Admitting: Endocrinology

## 2021-08-25 VITALS — BP 130/60 | HR 75 | Ht 70.0 in | Wt 224.6 lb

## 2021-08-25 DIAGNOSIS — E871 Hypo-osmolality and hyponatremia: Secondary | ICD-10-CM

## 2021-08-25 LAB — BASIC METABOLIC PANEL
BUN: 17 mg/dL (ref 6–23)
CO2: 26 mEq/L (ref 19–32)
Calcium: 8.8 mg/dL (ref 8.4–10.5)
Chloride: 107 mEq/L (ref 96–112)
Creatinine, Ser: 0.81 mg/dL (ref 0.40–1.50)
GFR: 97.4 mL/min (ref >60.00)
Glucose, Bld: 95 mg/dL (ref 70–99)
Potassium: 4.3 mEq/L (ref 3.5–5.1)
Sodium: 139 mEq/L (ref 135–145)

## 2021-08-25 LAB — BRAIN NATRIURETIC PEPTIDE: Pro B Natriuretic peptide (BNP): 12 pg/mL (ref 0.0–100.0)

## 2021-08-25 NOTE — Patient Instructions (Addendum)
Blood tests are requested for you today.  We'll let you know about the results.   Depending on the results, we may need to add a a diuretic pill.  It helps to reduce your fluid intake.   Please come back for a follow-up appointment in 3 months.

## 2021-08-25 NOTE — Progress Notes (Signed)
Subjective:    Patient ID: Jeremy [name], male    DOB: 1960/10/29, 60 y.o.   MRN: [id number]  HPI Pt returns for f/u of hyponatremia (dx'ed 2019; no cause was found  He has never had pituitary imaging; he has orange card; therapy limited by pt's request for least expensive meds, so he was rx'ed NaCl; BNP is low).  Since on NaCl, he feels slightly better in general.   Past Medical History:  Diagnosis Date   Diabetes mellitus without complication (HCC)    GERD (gastroesophageal reflux disease)    Hyperlipidemia    Hypertension    IDA (iron deficiency anemia)    Rash of entire body 03/2016   Sleep apnea     Past Surgical History:  Procedure Laterality Date   BACK SURGERY  1993   lumbar   BIOPSY  02/07/2019   Procedure: BIOPSY;  Surgeon: Danie Binder, MD;  [facility]: [facility];  Service: Endoscopy;;   CERVICAL SPINE SURGERY  2000   COLONOSCOPY N/A 02/07/2019   Dr. Oneida Alar: External hemorrhoids next colonoscopy in 10 years   ESOPHAGOGASTRODUODENOSCOPY N/A 02/07/2019   Dr. Oneida Alar: Barrett's esophagus without dysplasia chronic inactive gastritis but no H. 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, nonbleeding duodenal diverticulum,   ESOPHAGOGASTRODUODENOSCOPY N/A 01/05/2020   Procedure: ESOPHAGOGASTRODUODENOSCOPY (EGD);  Surgeon: Danie Binder, MD;  [facility]: [facility];  Service: Endoscopy;  Laterality: N/A;  10:30am   ESOPHAGOGASTRODUODENOSCOPY (EGD) WITH PROPOFOL N/A 12/06/2020   Procedure: ESOPHAGOGASTRODUODENOSCOPY (EGD) WITH PROPOFOL;  Surgeon: Eloise Harman, DO;  [facility];  Service: Endoscopy;  Laterality: N/A;  11:45am   GASTROJEJUNOSTOMY N/A 01/03/2021   Procedure: GASTROJEJUNOSTOMY;  Surgeon: Aviva Signs, MD;  Location: [facility];  Service: General;  Laterality: N/A;   HAND SURGERY     SAVORY DILATION N/A 01/05/2020   Procedure: SAVORY DILATION;  Surgeon: Danie Binder, MD;  [facility];  Service: Endoscopy;  Laterality: N/A;    Social History   Socioeconomic History   Marital status: Significant Other    Spouse name: Not on file   Number of children: Not on file   Years of education: Not on file   Highest education level: Not on file  Occupational History   Occupation: general contractor  Tobacco Use   Smoking status: Former    Types: Cigarettes    Quit date: 10/16/1988    Years since quitting: 32.8   Smokeless tobacco: Never  Vaping Use   Vaping Use: Never used  Substance and Sexual Activity   Alcohol use: No   Drug use: Yes    Comment: rare   Sexual activity: Not on file  Other Topics Concern   Not on file  Social History Narrative   Volunteers as Airline pilot   Social Determinants of Health   Financial Resource Strain: Not on file  Food Insecurity: Not on file  Transportation Needs: Not on file  Physical Activity: Not on file  Stress: Not on file  Social Connections: Not on file  Intimate Partner Violence: Not on file    Current Outpatient Medications on File Prior to Visit  Medication Sig Dispense Refill   acetaminophen (TYLENOL) 500 MG tablet Take 1,000 mg by mouth in the morning and at bedtime.      atorvastatin (LIPITOR) 80 MG tablet TAKE 1 TABLET (80 MG TOTAL) BY MOUTH DAILY. 90 tablet 2   Blood Glucose Monitoring Suppl (  TRUE METRIX METER) w/Device KIT 1 each by Does not apply route 4 (four) times daily -  before meals and at bedtime. 1 kit 0   cyclobenzaprine (FLEXERIL) 10 MG tablet Take 1 tablet (10 mg total) by mouth 2 (two) times daily as needed for muscle spasms. 60 tablet 3   ferrous sulfate (FEROSUL) 325 (65 FE) MG tablet Take 1 tablet (325 mg total) by mouth 2 (two) times daily with a meal. 60 tablet 1   fluticasone (FLONASE) 50 MCG/ACT nasal spray Place 1 spray into both nostrils daily as needed for allergies or rhinitis. 16 g 0   glipiZIDE (GLUCOTROL) 5 MG tablet TAKE 1 TABLET BY MOUTH IN THE MORNINGS AND 1 TABLET IN THE  EVENINGS. 60 tablet 2   glucose blood test strip Use as instructed 100 each 12   lisinopril (ZESTRIL) 40 MG tablet Take 1 tablet (40 mg total) by mouth daily. 90 tablet 0   naproxen sodium (ALEVE) 220 MG tablet Take 220 mg by mouth in the morning and at bedtime.     ondansetron (ZOFRAN) 8 MG tablet Take 1 tablet (8 mg total) by mouth daily as needed for nausea or vomiting. 30 tablet 0   pantoprazole (PROTONIX) 40 MG tablet TAKE 1 TABLET (40 MG TOTAL) BY MOUTH 2 (TWO) TIMES DAILY BEFORE A MEAL. 60 tablet 3   polyethylene glycol powder (GLYCOLAX/MIRALAX) 17 GM/SCOOP powder Take 17 g by mouth daily as needed. 510 g 1   Potassium 99 MG TABS Take 99 mg by mouth 2 (two) times daily.     rOPINIRole (REQUIP) 0.5 MG tablet TAKE 1 TABLET (0.5 MG TOTAL) BY MOUTH AT BEDTIME. 90 tablet 1   sildenafil (VIAGRA) 50 MG tablet 1 TAB 1/2 HR PRIOR TO SEX. LIMIT USE TO 1 TAB/24 HRS 10 tablet 3   sodium chloride 1 g tablet Take 2 tablets (2 g total) by mouth daily. 180 tablet 3   tamsulosin (FLOMAX) 0.4 MG CAPS capsule Take 1 capsule (0.4 mg total) by mouth daily. 90 capsule 0   traMADol (ULTRAM) 50 MG tablet Take 1 tablet (50 mg total) by mouth every 12 (twelve) hours as needed (to fill on or after 07/21/2021). 60 tablet 2   vitamin B-12 (CYANOCOBALAMIN) 500 MCG tablet Take 1 tablet (500 mcg total) by mouth daily. 90 tablet 1   No current facility-administered medications on file prior to visit.    Allergies  Allergen Reactions   Cymbalta [Duloxetine Hcl] Other (See Comments)    Caused anorgasmia   Doxycycline    Other Nausea And Vomiting    Mayonnaise mustard ketchup   Robaxin [Methocarbamol] Other (See Comments)    Had hiccups x 4 hours after taking    Family History  Problem Relation Age of Onset   Pancreatic cancer Mother    CAD Father    Colon cancer Paternal Grandfather     BP 130/60 (BP Location: Right Arm, Patient Position: Sitting, Cuff Size: Large)   Pulse 75   Ht 5' 10" (1.778 m)   Wt 224  lb 9.6 oz (101.9 kg)   SpO2 97%   BMI 32.23 kg/m    Review of Systems     Objective:   Physical Exam VITAL SIGNS:  See vs page GENERAL: no distress EXT: 1+ bilat leg edema   Lab Results  Component Value Date   CREATININE 0.81 08/25/2021   BUN 17 08/25/2021   NA 139 08/25/2021   K 4.3 08/25/2021   CL 107  08/25/2021   CO2 26 08/25/2021      Assessment & Plan:  Hyponatremia: well-controlled.  Please continue the same NaCl Edema: worse: check BNP again

## 2021-08-30 ENCOUNTER — Inpatient Hospital Stay: Payer: Self-pay | Attending: Physician Assistant | Admitting: Internal Medicine

## 2021-08-30 ENCOUNTER — Other Ambulatory Visit: Payer: Self-pay

## 2021-08-30 ENCOUNTER — Inpatient Hospital Stay: Payer: Self-pay

## 2021-08-30 ENCOUNTER — Encounter: Payer: Self-pay | Admitting: Internal Medicine

## 2021-08-30 VITALS — BP 134/57 | HR 55 | Temp 97.7°F | Resp 17 | Ht 70.0 in | Wt 227.9 lb

## 2021-08-30 DIAGNOSIS — K909 Intestinal malabsorption, unspecified: Secondary | ICD-10-CM | POA: Insufficient documentation

## 2021-08-30 DIAGNOSIS — R5383 Other fatigue: Secondary | ICD-10-CM | POA: Insufficient documentation

## 2021-08-30 DIAGNOSIS — Z888 Allergy status to other drugs, medicaments and biological substances status: Secondary | ICD-10-CM | POA: Insufficient documentation

## 2021-08-30 DIAGNOSIS — K219 Gastro-esophageal reflux disease without esophagitis: Secondary | ICD-10-CM | POA: Insufficient documentation

## 2021-08-30 DIAGNOSIS — I10 Essential (primary) hypertension: Secondary | ICD-10-CM | POA: Insufficient documentation

## 2021-08-30 DIAGNOSIS — D500 Iron deficiency anemia secondary to blood loss (chronic): Secondary | ICD-10-CM

## 2021-08-30 DIAGNOSIS — G4730 Sleep apnea, unspecified: Secondary | ICD-10-CM | POA: Insufficient documentation

## 2021-08-30 DIAGNOSIS — Z9884 Bariatric surgery status: Secondary | ICD-10-CM | POA: Insufficient documentation

## 2021-08-30 DIAGNOSIS — E119 Type 2 diabetes mellitus without complications: Secondary | ICD-10-CM | POA: Insufficient documentation

## 2021-08-30 DIAGNOSIS — D508 Other iron deficiency anemias: Secondary | ICD-10-CM | POA: Insufficient documentation

## 2021-08-30 DIAGNOSIS — Z7984 Long term (current) use of oral hypoglycemic drugs: Secondary | ICD-10-CM | POA: Insufficient documentation

## 2021-08-30 DIAGNOSIS — Z881 Allergy status to other antibiotic agents status: Secondary | ICD-10-CM | POA: Insufficient documentation

## 2021-08-30 DIAGNOSIS — Z79899 Other long term (current) drug therapy: Secondary | ICD-10-CM | POA: Insufficient documentation

## 2021-08-30 DIAGNOSIS — E785 Hyperlipidemia, unspecified: Secondary | ICD-10-CM | POA: Insufficient documentation

## 2021-08-30 LAB — IRON AND TIBC
Iron: 42 ug/dL (ref 42–163)
Saturation Ratios: 13 % — ABNORMAL LOW (ref 20–55)
TIBC: 333 ug/dL (ref 202–409)
UIBC: 291 ug/dL (ref 117–376)

## 2021-08-30 LAB — CBC WITH DIFFERENTIAL (CANCER CENTER ONLY)
Abs Immature Granulocytes: 0.04 10*3/uL (ref 0.00–0.07)
Basophils Absolute: 0 10*3/uL (ref 0.0–0.1)
Basophils Relative: 1 %
Eosinophils Absolute: 0.3 10*3/uL (ref 0.0–0.5)
Eosinophils Relative: 7 %
HCT: 35.4 % — ABNORMAL LOW (ref 39.0–52.0)
Hemoglobin: 11.2 g/dL — ABNORMAL LOW (ref 13.0–17.0)
Immature Granulocytes: 1 %
Lymphocytes Relative: 33 %
Lymphs Abs: 1.5 10*3/uL (ref 0.7–4.0)
MCH: 29.6 pg (ref 26.0–34.0)
MCHC: 31.6 g/dL (ref 30.0–36.0)
MCV: 93.4 fL (ref 80.0–100.0)
Monocytes Absolute: 0.5 10*3/uL (ref 0.1–1.0)
Monocytes Relative: 11 %
Neutro Abs: 2.2 10*3/uL (ref 1.7–7.7)
Neutrophils Relative %: 47 %
Platelet Count: 176 10*3/uL (ref 150–400)
RBC: 3.79 MIL/uL — ABNORMAL LOW (ref 4.22–5.81)
RDW: 14.4 % (ref 11.5–15.5)
WBC Count: 4.5 10*3/uL (ref 4.0–10.5)
nRBC: 0 % (ref 0.0–0.2)

## 2021-08-30 LAB — FERRITIN: Ferritin: 16 ng/mL — ABNORMAL LOW (ref 24–336)

## 2021-08-30 NOTE — Progress Notes (Signed)
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  PRIOR THERAPY: Iron infusion with Venofer 300 mg IV weekly for 3 weeks.  CURRENT THERAPY: Over-the-counter oral iron tablet with vitamin C.  INTERVAL HISTORY: Jeremy [name] 60 y.o. male returns to the clinic today for follow-up visit.  The patient is feeling fine today with no concerning complaints except for feeling cold most of the time.  He continues to tolerate his treatment with oral iron tablet and vitamin C twice daily fairly well.  He denied having any current nausea, vomiting, diarrhea or constipation.  He has no headache or visual changes.  He has no chest pain, shortness of breath, cough or hemoptysis.  He denied having any fever or chills.  He is here today for evaluation and repeat CBC, iron study and ferritin.   MEDICAL HISTORY: Past Medical History:  Diagnosis Date   Diabetes mellitus without complication (HCC)    GERD (gastroesophageal reflux disease)    Hyperlipidemia    Hypertension    IDA (iron deficiency anemia)    Rash of entire body 03/2016   Sleep apnea     ALLERGIES:  is allergic to cymbalta [duloxetine hcl], doxycycline, other, and robaxin [methocarbamol].  MEDICATIONS:  Current Outpatient Medications  Medication Sig Dispense Refill   acetaminophen (TYLENOL) 500 MG tablet Take 1,000 mg by mouth in the morning and at bedtime.      atorvastatin (LIPITOR) 80 MG tablet TAKE 1 TABLET (80 MG TOTAL) BY MOUTH DAILY. 90 tablet 2   Blood Glucose Monitoring Suppl (TRUE METRIX METER) w/Device KIT 1 each by Does not apply route 4 (four) times daily -  before meals and at bedtime. 1 kit 0   cyclobenzaprine (FLEXERIL) 10 MG tablet Take 1 tablet (10 mg total) by mouth 2 (two) times daily as  needed for muscle spasms. 60 tablet 3   ferrous sulfate (FEROSUL) 325 (65 FE) MG tablet Take 1 tablet (325 mg total) by mouth 2 (two) times daily with a meal. 60 tablet 1   fluticasone (FLONASE) 50 MCG/ACT nasal spray Place 1 spray into both nostrils daily as needed for allergies or rhinitis. 16 g 0   glipiZIDE (GLUCOTROL) 5 MG tablet TAKE 1 TABLET BY MOUTH IN THE MORNINGS AND 1 TABLET IN THE EVENINGS. 60 tablet 2   glucose blood test strip Use as instructed 100 each 12   lisinopril (ZESTRIL) 40 MG tablet Take 1 tablet (40 mg total) by mouth daily. 90 tablet 0   naproxen sodium (ALEVE) 220 MG tablet Take 220 mg by mouth in the morning and at bedtime.     ondansetron (ZOFRAN) 8 MG tablet Take 1 tablet (8 mg total) by mouth daily as needed for nausea or vomiting. 30 tablet 0   pantoprazole (PROTONIX) 40 MG tablet TAKE 1 TABLET (40 MG TOTAL) BY MOUTH 2 (TWO) TIMES DAILY BEFORE A MEAL. 60 tablet 3   polyethylene glycol powder (GLYCOLAX/MIRALAX) 17 GM/SCOOP powder Take 17 g by mouth daily as needed. 510 g 1   Potassium 99 MG TABS Take 99 mg by mouth 2 (two) times daily.     rOPINIRole (REQUIP) 0.5 MG tablet TAKE 1 TABLET (0.5 MG TOTAL) BY MOUTH AT BEDTIME. 90 tablet 1   sildenafil (VIAGRA) 50 MG tablet 1 TAB 1/2  HR PRIOR TO SEX. LIMIT USE TO 1 TAB/24 HRS 10 tablet 3   sodium chloride 1 g tablet Take 2 tablets (2 g total) by mouth daily. 180 tablet 3   tamsulosin (FLOMAX) 0.4 MG CAPS capsule Take 1 capsule (0.4 mg total) by mouth daily. 90 capsule 0   traMADol (ULTRAM) 50 MG tablet Take 1 tablet (50 mg total) by mouth every 12 (twelve) hours as needed (to fill on or after 07/21/2021). 60 tablet 2   vitamin B-12 (CYANOCOBALAMIN) 500 MCG tablet Take 1 tablet (500 mcg total) by mouth daily. 90 tablet 1   No current facility-administered medications for this visit.    SURGICAL HISTORY:  Past Surgical History:  Procedure Laterality Date   BACK SURGERY  1993   lumbar   BIOPSY  02/07/2019   Procedure:  BIOPSY;  Surgeon: Danie Binder, MD;  [facility];  Service: Endoscopy;;   CERVICAL SPINE SURGERY  2000   COLONOSCOPY N/A 02/07/2019   Dr. Oneida Alar: External hemorrhoids next colonoscopy in 10 years   ESOPHAGOGASTRODUODENOSCOPY N/A 02/07/2019   Dr. Oneida Alar: Barrett's esophagus without dysplasia chronic inactive gastritis but no H. 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, nonbleeding duodenal diverticulum,   ESOPHAGOGASTRODUODENOSCOPY N/A 01/05/2020   Procedure: ESOPHAGOGASTRODUODENOSCOPY (EGD);  Surgeon: Danie Binder, MD;  [facility];  Service: Endoscopy;  Laterality: N/A;  10:30am   ESOPHAGOGASTRODUODENOSCOPY (EGD) WITH PROPOFOL N/A 12/06/2020   Procedure: ESOPHAGOGASTRODUODENOSCOPY (EGD) WITH PROPOFOL;  Surgeon: Eloise Harman, DO;  [facility];  Service: Endoscopy;  Laterality: N/A;  11:45am   GASTROJEJUNOSTOMY N/A 01/03/2021   Procedure: GASTROJEJUNOSTOMY;  Surgeon: Aviva Signs, MD;  Location: [facility];  Service: General;  Laterality: N/A;   HAND SURGERY     SAVORY DILATION N/A 01/05/2020   Procedure: SAVORY DILATION;  Surgeon: Danie Binder, MD;  [facility];  Service: Endoscopy;  Laterality: N/A;    REVIEW OF SYSTEMS:  A comprehensive review of systems was negative except for: Constitutional: positive for fatigue   PHYSICAL EXAMINATION: General appearance: alert, cooperative, fatigued, and no distress Head: Normocephalic, without obvious abnormality, atraumatic Neck: no adenopathy, no JVD, supple, symmetrical, trachea midline, and thyroid not enlarged, symmetric, no tenderness/mass/nodules Lymph nodes: Cervical, supraclavicular, and axillary nodes normal. Resp: clear to auscultation bilaterally Back: symmetric, no curvature. ROM normal. No CVA tenderness. Cardio: regular rate and rhythm, S1, S2 normal, no murmur, click, rub or gallop GI: soft, non-tender; bowel sounds normal; no  masses,  no organomegaly Extremities: extremities normal, atraumatic, no cyanosis or edema  ECOG PERFORMANCE STATUS: 1 - Symptomatic but completely ambulatory  Blood pressure (!) 134/57, pulse (!) 55, temperature 97.7 F (36.5 C), temperature source Oral, resp. rate 17, height 5' 10"  (1.778 m), weight 227 lb 14.4 oz (103.4 kg), SpO2 100 %.  LABORATORY DATA: Lab Results  Component Value Date   WBC 4.5 08/30/2021   HGB 11.2 (L) 08/30/2021   HCT 35.4 (L) 08/30/2021   MCV 93.4 08/30/2021   PLT 176 08/30/2021      Chemistry      Component Value Date/Time   NA 139 08/25/2021 1049   NA 138 05/17/2021 1657   K 4.3 08/25/2021 1049   CL 107 08/25/2021 1049   CO2 26 08/25/2021 1049   BUN 17 08/25/2021 1049   BUN 11 05/17/2021 1657   CREATININE 0.81 08/25/2021 1049   CREATININE 0.72 10/17/2016 1012  Component Value Date/Time   CALCIUM 8.8 08/25/2021 1049   ALKPHOS 87 03/18/2021 0945   AST 18 03/18/2021 0945   ALT 16 03/18/2021 0945   BILITOT 0.3 03/18/2021 0945       RADIOGRAPHIC STUDIES: No results found.  ASSESSMENT AND PLAN: This is a very pleasant 60 years old white male with iron deficiency anemia secondary to malabsorption secondary to gastrojejunostomy in March 2022. The patient was treated with iron infusion with Venofer 300 mg IV for 3 doses. The patient is currently on oral iron tablet with vitamin C twice daily and he is tolerating it fairly well. Repeat CBC showed further improvement in his hemoglobin and hematocrit but he continues to have mild anemia.  Hemoglobin is 11.2 today. Iron study showed normal serum iron was 42 and iron saturation of 13%. I recommended for the patient to continue on the oral iron tablet for now. I will see him back for follow-up visit in 3 months for evaluation with repeat CBC, iron study and ferritin. He was advised to call immediately if he has any other concerning symptoms in the interval. The patient voices understanding of  current disease status and treatment options and is in agreement with the current care plan. All questions were answered. The patient knows to call the clinic with any problems, questions or concerns. We can certainly see the patient much sooner if necessary.  The total time spent in the appointment was 25 minutes.  Disclaimer: This note was dictated with voice recognition software. Similar sounding words can inadvertently be transcribed and may not be corrected upon review.

## 2021-09-02 ENCOUNTER — Other Ambulatory Visit: Payer: Self-pay

## 2021-09-02 ENCOUNTER — Ambulatory Visit: Payer: Self-pay | Attending: Internal Medicine | Admitting: Internal Medicine

## 2021-09-02 VITALS — BP 124/73 | HR 69 | Ht 70.0 in | Wt 229.6 lb

## 2021-09-02 DIAGNOSIS — I10 Essential (primary) hypertension: Secondary | ICD-10-CM

## 2021-09-02 DIAGNOSIS — R6889 Other general symptoms and signs: Secondary | ICD-10-CM

## 2021-09-02 DIAGNOSIS — E669 Obesity, unspecified: Secondary | ICD-10-CM

## 2021-09-02 DIAGNOSIS — D509 Iron deficiency anemia, unspecified: Secondary | ICD-10-CM

## 2021-09-02 DIAGNOSIS — Z23 Encounter for immunization: Secondary | ICD-10-CM

## 2021-09-02 DIAGNOSIS — E1169 Type 2 diabetes mellitus with other specified complication: Secondary | ICD-10-CM

## 2021-09-02 DIAGNOSIS — E222 Syndrome of inappropriate secretion of antidiuretic hormone: Secondary | ICD-10-CM

## 2021-09-02 LAB — GLUCOSE, POCT (MANUAL RESULT ENTRY): POC Glucose: 218 mg/dl — AB (ref 70–99)

## 2021-09-02 NOTE — Progress Notes (Signed)
Patient ID: Jeremy [name], male    DOB: 08-Dec-1960  MRN: [id number]  CC: Diabetes   Subjective: Jeremy [name] is a 60 y.o. male who presents for chronic ds management His concerns today include:  Pt with hx of DM, HL, HTN,GERD, IDA (Barrett's esophagus and moderate gastritis thought to be NSAID induced on EGD; iron infusions by Dr. 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  Myoview neg 02/2021  IDA: saw Dr. Julien Nordmann recently for follow-up on iron deficiency anemia.  Hemoglobin is improved from 10.2 now at 11.2.  Iron level remains low.  Patient continues to take oral iron supplement twice a day.  Patient complains of feeling cold all the time.  He would like to get his thyroid level checked.  DM: Results for orders placed or performed in visit on 09/02/21  POCT glucose (manual entry)  Result Value Ref Range   POC Glucose 218 (A) 70 - 99 mg/dl   Lab Results  Component Value Date   HGBA1C 5.6 05/17/2021  Checks BS in a.m 88-104.  High today because he ate a popsicle in the parking lot before coming in for his appointment.   Wgh up 16 since last visit.  He feels that his food is being absorbed a little better now that he is several months out from his surgery.  For the first several months post gastric surgery, he was having issues with vomiting and diarrhea.   He is overdue for eye exam.  Not able to afford at this time. Not able to afford eye exam  Saw Dr. Loanne Drilling since last visit with me low sodium for possible SIADH.  He started him on sodium chloride 1 g twice a day.  Patient is concerned that it may affect his blood pressure.  He cut back to taking just once a day.  Recent BMP showed normal sodium level. HTN:  BP 100-110/50s in mornings He is on lisinopril 20 BID   HM: flu and pneumonia are due.  He is agreeable to receiving those  today Patient Active Problem List   Diagnosis Date Noted  . Hyponatremia 06/23/2021  . Low serum vitamin B12 03/23/2021  . Vitamin D deficiency 03/23/2021  . S/P bypass gastrojejunostomy 01/03/2021  . Gastric outlet obstruction   . Duodenal stricture   . Primary osteoarthritis of both hips 11/15/2020  . Aortic atherosclerosis ([place]) 08/30/2020  . Coronary artery calcification 08/30/2020  . Bilateral carpal tunnel syndrome 06/07/2020  . Grief reaction 06/07/2020  . 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 Congenital hypertrophic pyloric stenosis   . Duodenal web   . Barrett's esophagus without dysplasia 05/19/2019  . OSA (obstructive sleep apnea) 04/30/2019  . Iron deficiency anemia 11/25/2018  . Phimosis of penis 11/19/2018  . Esophageal dysphagia 08/19/2018  . Benign prostatic hyperplasia with post-void dribbling 01/31/2018  . Absence of bladder continence 01/31/2018  . Obesity (BMI 30-39.9) 11/05/2017  . GERD (gastroesophageal reflux disease) 06/03/2017  . Chronic pain of both knees 06/03/2017  . Chronic lower back pain 06/03/2017  . Type 2 diabetes mellitus without complication, without long-term current use of insulin ([place]) 04/12/2016  . Essential hypertension 04/12/2016     Current Outpatient Medications on File Prior to Visit  Medication Sig Dispense Refill  . acetaminophen (TYLENOL) 500 MG tablet Take 1,000 mg by mouth in the morning and at bedtime.     Marland Kitchen  atorvastatin (LIPITOR) 80 MG tablet TAKE 1 TABLET (80 MG TOTAL) BY MOUTH DAILY. 90 tablet 2  . Blood Glucose Monitoring Suppl (TRUE METRIX METER) w/Device KIT 1 each by Does not apply route 4 (four) times daily -  before meals and at bedtime. 1 kit 0  . cyclobenzaprine (FLEXERIL) 10 MG tablet Take 1 tablet (10 mg total) by mouth 2 (two) times daily as needed for muscle spasms. 60 tablet 3  . ferrous sulfate (FEROSUL) 325 (65 FE) MG tablet Take 1 tablet (325 mg total) by mouth 2 (two) times daily with a meal. 60 tablet 1   . fluticasone (FLONASE) 50 MCG/ACT nasal spray Place 1 spray into both nostrils daily as needed for allergies or rhinitis. 16 g 0  . glipiZIDE (GLUCOTROL) 5 MG tablet TAKE 1 TABLET BY MOUTH IN THE MORNINGS AND 1 TABLET IN THE EVENINGS. 60 tablet 2  . glucose blood test strip Use as instructed 100 each 12  . lisinopril (ZESTRIL) 40 MG tablet Take 1 tablet (40 mg total) by mouth daily. 90 tablet 0  . naproxen sodium (ALEVE) 220 MG tablet Take 220 mg by mouth in the morning and at bedtime.    . ondansetron (ZOFRAN) 8 MG tablet Take 1 tablet (8 mg total) by mouth daily as needed for nausea or vomiting. 30 tablet 0  . pantoprazole (PROTONIX) 40 MG tablet TAKE 1 TABLET (40 MG TOTAL) BY MOUTH 2 (TWO) TIMES DAILY BEFORE A MEAL. 60 tablet 3  . polyethylene glycol powder (GLYCOLAX/MIRALAX) 17 GM/SCOOP powder Take 17 g by mouth daily as needed. 510 g 1  . Potassium 99 MG TABS Take 99 mg by mouth 2 (two) times daily.    Marland Kitchen rOPINIRole (REQUIP) 0.5 MG tablet TAKE 1 TABLET (0.5 MG TOTAL) BY MOUTH AT BEDTIME. 90 tablet 1  . sildenafil (VIAGRA) 50 MG tablet 1 TAB 1/2 HR PRIOR TO SEX. LIMIT USE TO 1 TAB/24 HRS 10 tablet 3  . sodium chloride 1 g tablet Take 2 tablets (2 g total) by mouth daily. 180 tablet 3  . tamsulosin (FLOMAX) 0.4 MG CAPS capsule Take 1 capsule (0.4 mg total) by mouth daily. 90 capsule 0  . traMADol (ULTRAM) 50 MG tablet Take 1 tablet (50 mg total) by mouth every 12 (twelve) hours as needed (to fill on or after 07/21/2021). 60 tablet 2  . vitamin B-12 (CYANOCOBALAMIN) 500 MCG tablet Take 1 tablet (500 mcg total) by mouth daily. 90 tablet 1   No current facility-administered medications on file prior to visit.    Allergies  Allergen Reactions  . Cymbalta [Duloxetine Hcl] Other (See Comments)    Caused anorgasmia  . Doxycycline   . Other Nausea And Vomiting    Mayonnaise mustard ketchup  . Robaxin [Methocarbamol] Other (See Comments)    Had hiccups x 4 hours after taking    Social  History   Socioeconomic History  . Marital status: Significant Other    Spouse name: Not on file  . Number of children: Not on file  . Years of education: Not on file  . Highest education level: Not on file  Occupational History  . Occupation: Clinical biochemist  Tobacco Use  . Smoking status: Former    Types: Cigarettes    Quit date: 10/16/1988    Years since quitting: 32.9  . Smokeless tobacco: Never  Vaping Use  . Vaping Use: Never used  Substance and Sexual Activity  . Alcohol use: No  . Drug use: Yes  Comment: rare  . Sexual activity: Not on file  Other Topics Concern  . Not on file  Social History Narrative   Volunteers as Airline pilot   Social Determinants of Radio broadcast assistant Strain: Not on file  Food Insecurity: Not on file  Transportation Needs: Not on file  Physical Activity: Not on file  Stress: Not on file  Social Connections: Not on file  Intimate Partner Violence: Not on file    Family History  Problem Relation Age of Onset  . Pancreatic cancer Mother   . CAD Father   . Colon cancer Paternal Grandfather     Past Surgical History:  Procedure Laterality Date  . BACK SURGERY  1993   lumbar  . BIOPSY  02/07/2019   Procedure: BIOPSY;  Surgeon: Danie Binder, MD;  [facility];  Service: Endoscopy;;  . CERVICAL SPINE SURGERY  2000  . COLONOSCOPY N/A 02/07/2019   Dr. Oneida Alar: External hemorrhoids next colonoscopy in 10 years  . ESOPHAGOGASTRODUODENOSCOPY N/A 02/07/2019   Dr. Oneida Alar: Barrett's esophagus without dysplasia chronic inactive gastritis but no H. 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 ESOPHAGOGASTRODUODENOSCOPY N/A 01/05/2020   Procedure: ESOPHAGOGASTRODUODENOSCOPY (EGD);  Surgeon: Danie Binder, MD;  [facility];  Service: Endoscopy;  Laterality: N/A;  10:30am  . ESOPHAGOGASTRODUODENOSCOPY (EGD) WITH PROPOFOL N/A  12/06/2020   Procedure: ESOPHAGOGASTRODUODENOSCOPY (EGD) WITH PROPOFOL;  Surgeon: Eloise Harman, DO;  [facility];  Service: Endoscopy;  Laterality: N/A;  11:45am  . GASTROJEJUNOSTOMY N/A 01/03/2021   Procedure: GASTROJEJUNOSTOMY;  Surgeon: Aviva Signs, MD;  Location: [facility];  Service: General;  Laterality: N/A;  . HAND SURGERY    . SAVORY DILATION N/A 01/05/2020   Procedure: SAVORY DILATION;  Surgeon: Danie Binder, MD;  [facility];  Service: Endoscopy;  Laterality: N/A;    ROS: Review of Systems Negative except as stated above  PHYSICAL EXAM: BP 124/73   Pulse 69   Ht _0  (1.778 m)   Wt 229 lb 9.6 oz (104.1 kg)   SpO2 97%   BMI 32.94 kg/m   Wt Readings from Last 3 Encounters:  09/02/21 229 lb 9.6 oz (104.1 kg)  08/30/21 227 lb 14.4 oz (103.4 kg)  08/25/21 224 lb 9.6 oz (101.9 kg)    Physical Exam   General appearance - alert, well appearing, and in no distress Mental status - normal mood, behavior, speech, dress, motor activity, and thought processes Neck - supple, no significant adenopathy Chest - clear to auscultation, no wheezes, rales or rhonchi, symmetric air entry Heart - normal rate, regular rhythm, normal S1, S2, no murmurs, rubs, clicks or gallops   CMP Latest Ref Rng & Units 08/25/2021 06/23/2021 05/17/2021  Glucose 70 - 99 mg/dL 95 110(H) 107(H)  BUN 6 - 23 mg/dL _1 Creatinine 0.40 - 1.50 mg/dL 0.81 0.84 0.69(L)  Sodium 135 - 145 mEq/L 139 139 138  Potassium 3.5 - 5.1 mEq/L 4.3 3.9 4.0  Chloride 96 - 112 mEq/L 107 108 101  CO2 19 - 32 mEq/L _2 Calcium 8.4 - 10.5 mg/dL 8.8 9.1 9.2  Total Protein 6.0 - 8.5 g/dL - - -  Total Bilirubin 0.0 - 1.2 mg/dL - - -  Alkaline Phos 44 - 121 IU/L - - -  AST 0 - 40 IU/L - - -  ALT 0 - 44 IU/L - - -  Lipid Panel     Component Value Date/Time   CHOL 150 02/05/2020 1442   TRIG 168 (H) 02/05/2020 1442   HDL 40 02/05/2020 1442   CHOLHDL 3.8 02/05/2020 1442   CHOLHDL 4.8  10/17/2016 1012   VLDL NOT CALC 10/17/2016 1012   LDLCALC 81 02/05/2020 1442    CBC    Component Value Date/Time   WBC 4.5 08/30/2021 1114   WBC 10.0 03/09/2021 2008   RBC 3.79 (L) 08/30/2021 1114   HGB 11.2 (L) 08/30/2021 1114   HGB 10.0 (L) 05/17/2021 1657   HCT 35.4 (L) 08/30/2021 1114   HCT 31.6 (L) 05/17/2021 1657   PLT 176 08/30/2021 1114   PLT 236 05/17/2021 1657   MCV 93.4 08/30/2021 1114   MCV 89 05/17/2021 1657   MCH 29.6 08/30/2021 1114   MCHC 31.6 08/30/2021 1114   RDW 14.4 08/30/2021 1114   RDW 16.4 (H) 05/17/2021 1657   LYMPHSABS 1.5 08/30/2021 1114   LYMPHSABS 2.0 08/13/2019 1617   MONOABS 0.5 08/30/2021 1114   EOSABS 0.3 08/30/2021 1114   EOSABS 0.4 08/13/2019 1617   BASOSABS 0.0 08/30/2021 1114   BASOSABS 0.1 08/13/2019 1617    ASSESSMENT AND PLAN: 1. Type 2 diabetes mellitus with obesity ([place]) Reported home blood sugar readings are at goal.  Blood sugar elevated today because he just ate a popsicle.  Encouraged healthy eating habits.  Advised against further weight gain. - POCT glucose (manual entry)  2. Essential hypertension At goal.  He will continue to monitor at home.  Continue lisinopril 20 mg twice a day.  3. Iron deficiency anemia, unspecified iron deficiency anemia type Continue iron supplement.  He is being monitored by the oncologist.  4. SIADH (syndrome of inappropriate ADH production) ([place]) Patient has self cut down to 1 sodium tablet a day.  5. Cold intolerance I told him this could be due to iron deficiency anemia.  He would like to get his thyroid checked nonetheless. - TSH; Future  6. Need for vaccination against Streptococcus pneumoniae Given PCV 15 today.  7. Need for immunization against influenza - Flu Vaccine QUAD 40moIM (Fluarix, Fluzone & Alfiuria Quad PF)     Patient was given the opportunity to ask questions.  Patient verbalized understanding of the plan and was able to repeat key elements of the plan.   Orders  Placed This Encounter  Procedures  . Flu Vaccine QUAD 610moM (Fluarix, Fluzone & Alfiuria Quad PF)  . TSH  . POCT glucose (manual entry)     Requested Prescriptions    No prescriptions requested or ordered in this encounter    Return in about 4 months (around 12/31/2021).  DeKarle PlumberMD, FACP

## 2021-09-14 ENCOUNTER — Encounter: Payer: Self-pay | Admitting: Physician Assistant

## 2021-09-14 ENCOUNTER — Other Ambulatory Visit: Payer: Self-pay

## 2021-09-14 MED FILL — Sildenafil Citrate Tab 50 MG: ORAL | 30 days supply | Qty: 10 | Fill #0 | Status: CN

## 2021-09-19 ENCOUNTER — Other Ambulatory Visit: Payer: Self-pay

## 2021-09-19 ENCOUNTER — Encounter: Payer: Self-pay | Admitting: Physician Assistant

## 2021-09-21 ENCOUNTER — Other Ambulatory Visit: Payer: Self-pay

## 2021-09-29 ENCOUNTER — Other Ambulatory Visit: Payer: Self-pay

## 2021-09-29 ENCOUNTER — Encounter: Payer: Self-pay | Admitting: Physician Assistant

## 2021-09-29 ENCOUNTER — Other Ambulatory Visit: Payer: Self-pay | Admitting: Internal Medicine

## 2021-09-30 ENCOUNTER — Encounter: Payer: Self-pay | Admitting: Physician Assistant

## 2021-09-30 ENCOUNTER — Other Ambulatory Visit: Payer: Self-pay

## 2021-09-30 MED ORDER — CYCLOBENZAPRINE HCL 10 MG PO TABS
10.0000 mg | ORAL_TABLET | Freq: Two times a day (BID) | ORAL | 3 refills | Status: DC | PRN
Start: 2021-09-30 — End: 2022-02-12
  Filled 2021-09-30 – 2021-11-09 (×4): qty 60, 30d supply, fill #0
  Filled 2021-12-05: qty 60, 30d supply, fill #1
  Filled 2022-01-04 – 2022-01-09 (×2): qty 60, 30d supply, fill #2

## 2021-09-30 MED ORDER — ROPINIROLE HCL 0.5 MG PO TABS
ORAL_TABLET | ORAL | 1 refills | Status: DC
  Filled 2021-09-30 – 2021-11-09 (×4): qty 30, 30d supply, fill #0
  Filled 2021-12-05: qty 30, 30d supply, fill #1
  Filled 2022-01-04 – 2022-01-09 (×2): qty 30, 30d supply, fill #2
  Filled 2022-02-12: qty 30, 30d supply, fill #3
  Filled 2022-03-11: qty 30, 30d supply, fill #4

## 2021-09-30 NOTE — Telephone Encounter (Signed)
Requested Prescriptions  Pending Prescriptions Disp Refills   rOPINIRole (REQUIP) 0.5 MG tablet 90 tablet 1    Sig: TAKE 1 TABLET (0.5 MG TOTAL) BY MOUTH AT BEDTIME.     Neurology:  Parkinsonian Agents Passed - 09/29/2021  2:11 PM      Passed - Last BP in normal range    BP Readings from Last 1 Encounters:  09/02/21 124/73         Passed - Valid encounter within last 12 months    Recent Outpatient Visits          4 weeks ago Type 2 diabetes mellitus with obesity (HCC)   [place] [facility] Jonah Blue B, MD   4 months ago Type 2 diabetes mellitus with obesity [facility])   [facility] Marcine Matar, MD   6 months ago Dizziness   [facility] Marcine Matar, MD   9 months ago Preoperative evaluation to rule out surgical contraindication   [facility] Marcine Matar, MD   10 months ago Acute pain of right knee   [facility] Marcine Matar, MD      Future Appointments            In 3 months Marcine Matar, MD [facility]            cyclobenzaprine (FLEXERIL) 10 MG tablet 60 tablet 3    Sig: Take 1 tablet (10 mg total) by mouth 2 (two) times daily as needed for muscle spasms.     Not Delegated - Analgesics:  Muscle Relaxants Failed - 09/29/2021  2:11 PM      Failed - This refill cannot be delegated      Passed - Valid encounter within last 6 months    Recent Outpatient Visits          4 weeks ago Type 2 diabetes mellitus with obesity (HCC)   [place] [facility] Jonah Blue B, MD   4 months ago Type 2 diabetes mellitus with obesity [facility])   [place] [facility] Marcine Matar, MD   6 months ago Dizziness   [facility] Marcine Matar, MD   9 months ago Preoperative evaluation to rule out surgical  contraindication   [facility] Marcine Matar, MD   10 months ago Acute pain of right knee   [facility] Marcine Matar, MD      Future Appointments            In 3 months Laural Benes Binnie Rail, MD [facility]

## 2021-09-30 NOTE — Telephone Encounter (Signed)
Requested medication (s) are due for refill today: yes  Requested medication (s) are on the active medication list: yes  Last refill:  08/22/21  Future visit scheduled: 01/02/22  Notes to clinic:  This medication can not be delegated, please assess.    Requested Prescriptions  Pending Prescriptions Disp Refills   cyclobenzaprine (FLEXERIL) 10 MG tablet 60 tablet 3    Sig: Take 1 tablet (10 mg total) by mouth 2 (two) times daily as needed for muscle spasms.     Not Delegated - Analgesics:  Muscle Relaxants Failed - 09/29/2021  2:11 PM      Failed - This refill cannot be delegated      Passed - Valid encounter within last 6 months    Recent Outpatient Visits           4 weeks ago Type 2 diabetes mellitus with obesity (HCC)   [place] [facility] Jonah Blue B, MD   4 months ago Type 2 diabetes mellitus with obesity [facility])   [employer] [facility] Marcine Matar, MD   6 months ago Dizziness   [place] [facility] Marcine Matar, MD   9 months ago Preoperative evaluation to rule out surgical contraindication   [facility] Marcine Matar, MD   10 months ago Acute pain of right knee   [facility] Marcine Matar, MD       Future Appointments             In 3 months Marcine Matar, MD [place] [facility]            Signed Prescriptions Disp Refills   rOPINIRole (REQUIP) 0.5 MG tablet 90 tablet 1    Sig: TAKE 1 TABLET (0.5 MG TOTAL) BY MOUTH AT BEDTIME.     Neurology:  Parkinsonian Agents Passed - 09/29/2021  2:11 PM      Passed - Last BP in normal range    BP Readings from Last 1 Encounters:  09/02/21 124/73          Passed - Valid encounter within last 12 months    Recent Outpatient Visits           4 weeks ago Type 2 diabetes mellitus with obesity (HCC)   [place] [facility]  Jonah Blue B, MD   4 months ago Type 2 diabetes mellitus with obesity [facility])   [facility] Marcine Matar, MD   6 months ago Dizziness   [place] [facility] Marcine Matar, MD   9 months ago Preoperative evaluation to rule out surgical contraindication   [facility] Marcine Matar, MD   10 months ago Acute pain of right knee   [facility] Marcine Matar, MD       Future Appointments             In 3 months Laural Benes Binnie Rail, MD [facility]

## 2021-10-03 ENCOUNTER — Other Ambulatory Visit: Payer: Self-pay

## 2021-10-03 ENCOUNTER — Encounter: Payer: Self-pay | Admitting: Physician Assistant

## 2021-10-05 ENCOUNTER — Ambulatory Visit: Payer: Self-pay

## 2021-10-16 ENCOUNTER — Other Ambulatory Visit: Payer: Self-pay | Admitting: Internal Medicine

## 2021-10-16 DIAGNOSIS — E119 Type 2 diabetes mellitus without complications: Secondary | ICD-10-CM

## 2021-10-16 DIAGNOSIS — D509 Iron deficiency anemia, unspecified: Secondary | ICD-10-CM

## 2021-10-17 ENCOUNTER — Encounter: Payer: Self-pay | Admitting: Physician Assistant

## 2021-10-17 ENCOUNTER — Other Ambulatory Visit: Payer: Self-pay

## 2021-10-17 MED ORDER — GLIPIZIDE 5 MG PO TABS
ORAL_TABLET | ORAL | 2 refills | Status: DC
  Filled 2021-10-17 – 2021-11-21 (×3): qty 60, 30d supply, fill #0
  Filled 2021-12-20 (×2): qty 60, 30d supply, fill #1

## 2021-10-17 MED ORDER — FERROUS SULFATE 325 (65 FE) MG PO TABS
325.0000 mg | ORAL_TABLET | Freq: Two times a day (BID) | ORAL | 2 refills | Status: DC
  Filled 2021-10-17 – 2021-11-15 (×3): qty 60, 30d supply, fill #0
  Filled 2021-12-20 (×2): qty 60, 30d supply, fill #1

## 2021-10-17 MED ORDER — LISINOPRIL 40 MG PO TABS
40.0000 mg | ORAL_TABLET | Freq: Every day | ORAL | 2 refills | Status: DC
Start: 2021-10-17 — End: 2022-01-02
  Filled 2021-10-17 – 2021-11-15 (×3): qty 30, 30d supply, fill #0
  Filled 2021-12-20 (×2): qty 30, 30d supply, fill #1

## 2021-10-18 ENCOUNTER — Other Ambulatory Visit: Payer: Self-pay

## 2021-10-30 ENCOUNTER — Other Ambulatory Visit: Payer: Self-pay | Admitting: Internal Medicine

## 2021-10-30 MED ORDER — TAMSULOSIN HCL 0.4 MG PO CAPS
0.4000 mg | ORAL_CAPSULE | Freq: Every day | ORAL | 0 refills | Status: DC
  Filled 2021-10-30: qty 90, 90d supply, fill #0

## 2021-10-30 NOTE — Telephone Encounter (Signed)
Requested Prescriptions  Pending Prescriptions Disp Refills   tamsulosin (FLOMAX) 0.4 MG CAPS capsule 90 capsule 0    Sig: Take 1 capsule (0.4 mg total) by mouth daily.     Urology: Alpha-Adrenergic Blocker Passed - 10/30/2021  2:20 AM      Passed - Last BP in normal range    BP Readings from Last 1 Encounters:  09/02/21 124/73         Passed - Valid encounter within last 12 months    Recent Outpatient Visits          1 month ago Type 2 diabetes mellitus with obesity (HCC)   [place] [facility] Jonah Blue B, MD   5 months ago Type 2 diabetes mellitus with obesity [facility])   [place] Marcine Matar, MD   7 months ago Dizziness   [facility] Marcine Matar, MD   10 months ago Preoperative evaluation to rule out surgical contraindication   [facility] Marcine Matar, MD   11 months ago Acute pain of right knee   [facility] Marcine Matar, MD      Future Appointments            In 2 months Laural Benes Binnie Rail, MD [facility]

## 2021-10-31 ENCOUNTER — Encounter: Payer: Self-pay | Admitting: Physician Assistant

## 2021-10-31 ENCOUNTER — Other Ambulatory Visit (HOSPITAL_COMMUNITY): Payer: Self-pay

## 2021-11-01 ENCOUNTER — Encounter: Payer: Self-pay | Admitting: Physician Assistant

## 2021-11-01 ENCOUNTER — Other Ambulatory Visit: Payer: Self-pay

## 2021-11-01 ENCOUNTER — Other Ambulatory Visit (HOSPITAL_COMMUNITY): Payer: Self-pay

## 2021-11-02 ENCOUNTER — Other Ambulatory Visit (HOSPITAL_COMMUNITY): Payer: Self-pay

## 2021-11-07 ENCOUNTER — Other Ambulatory Visit (HOSPITAL_COMMUNITY): Payer: Self-pay

## 2021-11-08 ENCOUNTER — Other Ambulatory Visit: Payer: Self-pay

## 2021-11-09 ENCOUNTER — Other Ambulatory Visit (HOSPITAL_COMMUNITY): Payer: Self-pay

## 2021-11-09 ENCOUNTER — Other Ambulatory Visit: Payer: Self-pay | Admitting: Internal Medicine

## 2021-11-09 ENCOUNTER — Other Ambulatory Visit: Payer: Self-pay

## 2021-11-09 ENCOUNTER — Encounter: Payer: Self-pay | Admitting: Physician Assistant

## 2021-11-09 DIAGNOSIS — J302 Other seasonal allergic rhinitis: Secondary | ICD-10-CM

## 2021-11-10 ENCOUNTER — Encounter: Payer: Self-pay | Admitting: Internal Medicine

## 2021-11-10 ENCOUNTER — Other Ambulatory Visit: Payer: Self-pay

## 2021-11-10 ENCOUNTER — Encounter: Payer: Self-pay | Admitting: Physician Assistant

## 2021-11-10 ENCOUNTER — Other Ambulatory Visit (HOSPITAL_COMMUNITY): Payer: Self-pay

## 2021-11-10 MED ORDER — FLUTICASONE PROPIONATE 50 MCG/ACT NA SUSP
1.0000 | Freq: Every day | NASAL | 0 refills | Status: DC | PRN
  Filled 2021-11-10: qty 16, 60d supply, fill #0
  Filled 2021-11-10: qty 16, 30d supply, fill #0

## 2021-11-10 NOTE — Telephone Encounter (Signed)
Requested Prescriptions  Pending Prescriptions Disp Refills   fluticasone (FLONASE) 50 MCG/ACT nasal spray 16 g 0    Sig: Place 1 spray into both nostrils daily as needed for allergies or rhinitis.     Ear, Nose, and Throat: Nasal Preparations - Corticosteroids Passed - 11/09/2021 11:35 PM      Passed - Valid encounter within last 12 months    Recent Outpatient Visits          2 months ago Type 2 diabetes mellitus with obesity (HCC)   [place] [facility] Jonah Blue B, MD   5 months ago Type 2 diabetes mellitus with obesity [facility])   [place] [facility] Marcine Matar, MD   7 months ago Dizziness   [facility] Marcine Matar, MD   10 months ago Preoperative evaluation to rule out surgical contraindication   [facility] Marcine Matar, MD   12 months ago Acute pain of right knee   [facility] Marcine Matar, MD      Future Appointments            In 1 month Laural Benes Binnie Rail, MD [facility]

## 2021-11-14 MED FILL — Sildenafil Citrate Tab 50 MG: ORAL | Qty: 10 | Fill #0 | Status: CN

## 2021-11-15 ENCOUNTER — Encounter: Payer: Self-pay | Admitting: Physician Assistant

## 2021-11-15 ENCOUNTER — Other Ambulatory Visit (HOSPITAL_COMMUNITY): Payer: Self-pay

## 2021-11-15 ENCOUNTER — Other Ambulatory Visit: Payer: Self-pay

## 2021-11-15 MED FILL — Sildenafil Citrate Tab 50 MG: ORAL | 20 days supply | Qty: 10 | Fill #0 | Status: AC

## 2021-11-16 ENCOUNTER — Telehealth: Payer: Self-pay | Admitting: Internal Medicine

## 2021-11-16 NOTE — Telephone Encounter (Signed)
Copied from CRM [id number]. Topic: General - Other >> Nov 16, 2021  1:59 PM McGill, Darlina Rumpf wrote: Reason for CRM: Pt requesting an appointment to apply for the orange card. Pt requesting call back.

## 2021-11-16 NOTE — Telephone Encounter (Signed)
I return Pt call, schedule a financial appt ?

## 2021-11-21 ENCOUNTER — Other Ambulatory Visit (HOSPITAL_COMMUNITY): Payer: Self-pay

## 2021-11-21 ENCOUNTER — Other Ambulatory Visit: Payer: Self-pay

## 2021-11-21 ENCOUNTER — Encounter: Payer: Self-pay | Admitting: Physician Assistant

## 2021-11-22 ENCOUNTER — Other Ambulatory Visit: Payer: Self-pay

## 2021-11-23 ENCOUNTER — Ambulatory Visit: Payer: Self-pay | Attending: Internal Medicine

## 2021-11-25 ENCOUNTER — Ambulatory Visit (INDEPENDENT_AMBULATORY_CARE_PROVIDER_SITE_OTHER): Payer: Self-pay | Admitting: Endocrinology

## 2021-11-25 ENCOUNTER — Other Ambulatory Visit: Payer: Self-pay

## 2021-11-25 VITALS — BP 144/60 | HR 72 | Ht 70.0 in | Wt 223.0 lb

## 2021-11-25 DIAGNOSIS — E871 Hypo-osmolality and hyponatremia: Secondary | ICD-10-CM

## 2021-11-25 LAB — BASIC METABOLIC PANEL
BUN: 21 mg/dL (ref 6–23)
CO2: 27 mEq/L (ref 19–32)
Calcium: 9.3 mg/dL (ref 8.4–10.5)
Chloride: 105 mEq/L (ref 96–112)
Creatinine, Ser: 0.76 mg/dL (ref 0.40–1.50)
GFR: 99.12 mL/min (ref >60.00)
Glucose, Bld: 125 mg/dL — ABNORMAL HIGH (ref 70–99)
Potassium: 4.4 mEq/L (ref 3.5–5.1)
Sodium: 139 mEq/L (ref 135–145)

## 2021-11-25 NOTE — Patient Instructions (Addendum)
Blood tests are requested for you today.  We'll let you know about the results.   It helps to reduce your fluid intake.   Please come back for a follow-up appointment in 4-6 months.

## 2021-11-25 NOTE — Progress Notes (Signed)
Subjective:    Patient ID: Jeremy [name], male    DOB: [birth year], 61 y.o.   MRN: [id number]  HPI Pt returns for f/u of hyponatremia (dx'ed 2019; no cause was found. he has never had pituitary imaging; he has orange card; therapy limited by pt's request for least expensive meds, so he was rx'ed NaCl; BNP is low).  pt states he feels well in general.  Pt takes NaCl, 1g/d.   Past Medical History:  Diagnosis Date   Diabetes mellitus without complication (HCC)    GERD (gastroesophageal reflux disease)    Hyperlipidemia    Hypertension    IDA (iron deficiency anemia)    Rash of entire body 03/2016   Sleep apnea     Past Surgical History:  Procedure Laterality Date   BACK SURGERY  1993   lumbar   BIOPSY  02/07/2019   Procedure: BIOPSY;  Surgeon: Danie Binder, MD;  [facility];  Service: Endoscopy;;   CERVICAL SPINE SURGERY  2000   COLONOSCOPY N/A 02/07/2019   Dr. Oneida Alar: External hemorrhoids next colonoscopy in 10 years   ESOPHAGOGASTRODUODENOSCOPY N/A 02/07/2019   Dr. Oneida Alar: Barrett's esophagus without dysplasia chronic inactive gastritis but no H. 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, nonbleeding duodenal diverticulum,   ESOPHAGOGASTRODUODENOSCOPY N/A 01/05/2020   Procedure: ESOPHAGOGASTRODUODENOSCOPY (EGD);  Surgeon: Danie Binder, MD;  [facility];  Service: Endoscopy;  Laterality: N/A;  10:30am   ESOPHAGOGASTRODUODENOSCOPY (EGD) WITH PROPOFOL N/A 12/06/2020   Procedure: ESOPHAGOGASTRODUODENOSCOPY (EGD) WITH PROPOFOL;  Surgeon: Eloise Harman, DO;  [facility];  Service: Endoscopy;  Laterality: N/A;  11:45am   GASTROJEJUNOSTOMY N/A 01/03/2021   Procedure: GASTROJEJUNOSTOMY;  Surgeon: Aviva Signs, MD;  Location: [facility];  Service: General;  Laterality: N/A;   HAND SURGERY     SAVORY DILATION N/A 01/05/2020   Procedure: SAVORY DILATION;  Surgeon: Danie Binder, MD;  [facility];  Service: Endoscopy;  Laterality: N/A;    Social History   Socioeconomic History   Marital status: Significant Other    Spouse name: Not on file   Number of children: Not on file   Years of education: Not on file   Highest education level: Not on file  Occupational History   Occupation: general contractor  Tobacco Use   Smoking status: Former    Types: Cigarettes    Quit date: 10/16/1988    Years since quitting: 33.1   Smokeless tobacco: Never  Vaping Use   Vaping Use: Never used  Substance and Sexual Activity   Alcohol use: No   Drug use: Yes    Comment: rare   Sexual activity: Not on file  Other Topics Concern   Not on file  Social History Narrative   Volunteers as Airline pilot   Social Determinants of Health   Financial Resource Strain: Not on file  Food Insecurity: Not on file  Transportation Needs: Not on file  Physical Activity: Not on file  Stress: Not on file  Social Connections: Not on file  Intimate Partner Violence: Not on file    Current Outpatient Medications on File Prior to Visit  Medication Sig Dispense Refill   acetaminophen (TYLENOL) 500 MG tablet Take 1,000 mg by mouth in the morning and at bedtime.      atorvastatin (LIPITOR) 80 MG tablet TAKE 1 TABLET (80 MG TOTAL) BY MOUTH DAILY. 90 tablet 2   Blood Glucose  Monitoring Suppl (TRUE METRIX METER) w/Device KIT 1 each by Does not apply route 4 (four) times daily -  before meals and at bedtime. 1 kit 0   cyclobenzaprine (FLEXERIL) 10 MG tablet Take 1 tablet (10 mg total) by mouth 2 (two) times daily as needed for muscle spasms. 60 tablet 3   ferrous sulfate (FEROSUL) 325 (65 FE) MG tablet Take 1 tablet (325 mg total) by mouth 2 (two) times daily with a meal. 60 tablet 2   fluticasone (FLONASE) 50 MCG/ACT nasal spray Place 1 spray into both nostrils daily as needed for allergies or rhinitis. 16 g 0   glipiZIDE (GLUCOTROL) 5 MG tablet TAKE 1 TABLET BY MOUTH IN THE MORNINGS AND 1 TABLET IN THE  EVENINGS. 60 tablet 2   glucose blood test strip Use as instructed 100 each 12   lisinopril (ZESTRIL) 40 MG tablet Take 1 tablet (40 mg total) by mouth daily. 30 tablet 2   naproxen sodium (ALEVE) 220 MG tablet Take 220 mg by mouth in the morning and at bedtime.     ondansetron (ZOFRAN) 8 MG tablet Take 1 tablet (8 mg total) by mouth daily as needed for nausea or vomiting. 30 tablet 0   pantoprazole (PROTONIX) 40 MG tablet TAKE 1 TABLET (40 MG TOTAL) BY MOUTH 2 (TWO) TIMES DAILY BEFORE A MEAL. 60 tablet 3   polyethylene glycol powder (GLYCOLAX/MIRALAX) 17 GM/SCOOP powder Take 17 g by mouth daily as needed. 510 g 1   Potassium 99 MG TABS Take 99 mg by mouth 2 (two) times daily.     rOPINIRole (REQUIP) 0.5 MG tablet TAKE 1 TABLET (0.5 MG TOTAL) BY MOUTH AT BEDTIME. 90 tablet 1   sildenafil (VIAGRA) 50 MG tablet TAKE 1 TABLET 1/2 HR PRIOR TO SEX. LIMIT USE TO 1 TAB/24 HRS 10 tablet 3   sodium chloride 1 g tablet Take 2 tablets (2 g total) by mouth daily. 180 tablet 3   tamsulosin (FLOMAX) 0.4 MG CAPS capsule Take 1 capsule by mouth daily. 90 capsule 0   traMADol (ULTRAM) 50 MG tablet Take 1 tablet (50 mg total) by mouth every 12 (twelve) hours as needed (to fill on or after 07/21/2021). 60 tablet 2   vitamin B-12 (CYANOCOBALAMIN) 500 MCG tablet Take 1 tablet (500 mcg total) by mouth daily. 90 tablet 1   No current facility-administered medications on file prior to visit.    Allergies  Allergen Reactions   Cymbalta [Duloxetine Hcl] Other (See Comments)    Caused anorgasmia   Doxycycline    Other Nausea And Vomiting    Mayonnaise mustard ketchup   Robaxin [Methocarbamol] Other (See Comments)    Had hiccups x 4 hours after taking    Family History  Problem Relation Age of Onset   Pancreatic cancer Mother    CAD Father    Colon cancer Paternal Grandfather     BP (!) 144/60    Pulse 72    Ht 5' 10"  (1.778 m)    Wt 223 lb (101.2 kg)    SpO2 98%    BMI 32.00 kg/m   Review of  Systems Denies sob    Objective:   Physical Exam VITAL SIGNS:  See vs page GENERAL: no distress EXT: 1+ bilat leg edema  Lab Results  Component Value Date   CREATININE 0.76 11/25/2021   BUN 21 11/25/2021   NA 139 11/25/2021   K 4.4 11/25/2021   CL 105 11/25/2021   CO2 27 11/25/2021  Assessment & Plan:  Hyponatremia: well-controlled.  Please continue the same salt tabs Edema: we discussed the fact that this is due to venous insuff, not volume overload.

## 2021-11-30 ENCOUNTER — Inpatient Hospital Stay (HOSPITAL_BASED_OUTPATIENT_CLINIC_OR_DEPARTMENT_OTHER): Payer: Self-pay | Admitting: Internal Medicine

## 2021-11-30 ENCOUNTER — Telehealth: Payer: Self-pay | Admitting: Pharmacy Technician

## 2021-11-30 ENCOUNTER — Other Ambulatory Visit: Payer: Self-pay | Admitting: Internal Medicine

## 2021-11-30 ENCOUNTER — Inpatient Hospital Stay: Payer: Self-pay | Attending: Physician Assistant

## 2021-11-30 ENCOUNTER — Other Ambulatory Visit: Payer: Self-pay

## 2021-11-30 VITALS — BP 130/54 | HR 78 | Temp 97.1°F | Resp 19 | Ht 70.0 in | Wt 221.4 lb

## 2021-11-30 DIAGNOSIS — K909 Intestinal malabsorption, unspecified: Secondary | ICD-10-CM | POA: Insufficient documentation

## 2021-11-30 DIAGNOSIS — D508 Other iron deficiency anemias: Secondary | ICD-10-CM

## 2021-11-30 DIAGNOSIS — D509 Iron deficiency anemia, unspecified: Secondary | ICD-10-CM | POA: Insufficient documentation

## 2021-11-30 DIAGNOSIS — D500 Iron deficiency anemia secondary to blood loss (chronic): Secondary | ICD-10-CM

## 2021-11-30 DIAGNOSIS — R6889 Other general symptoms and signs: Secondary | ICD-10-CM

## 2021-11-30 LAB — CBC WITH DIFFERENTIAL (CANCER CENTER ONLY)
Abs Immature Granulocytes: 0.11 10*3/uL — ABNORMAL HIGH (ref 0.00–0.07)
Basophils Absolute: 0.1 10*3/uL (ref 0.0–0.1)
Basophils Relative: 1 %
Eosinophils Absolute: 0.3 10*3/uL (ref 0.0–0.5)
Eosinophils Relative: 6 %
HCT: 37.9 % — ABNORMAL LOW (ref 39.0–52.0)
Hemoglobin: 12.8 g/dL — ABNORMAL LOW (ref 13.0–17.0)
Immature Granulocytes: 2 %
Lymphocytes Relative: 24 %
Lymphs Abs: 1.4 10*3/uL (ref 0.7–4.0)
MCH: 31.4 pg (ref 26.0–34.0)
MCHC: 33.8 g/dL (ref 30.0–36.0)
MCV: 92.9 fL (ref 80.0–100.0)
Monocytes Absolute: 0.6 10*3/uL (ref 0.1–1.0)
Monocytes Relative: 10 %
Neutro Abs: 3.2 10*3/uL (ref 1.7–7.7)
Neutrophils Relative %: 57 %
Platelet Count: 227 10*3/uL (ref 150–400)
RBC: 4.08 MIL/uL — ABNORMAL LOW (ref 4.22–5.81)
RDW: 12.6 % (ref 11.5–15.5)
WBC Count: 5.6 10*3/uL (ref 4.0–10.5)
nRBC: 0 % (ref 0.0–0.2)

## 2021-11-30 LAB — IRON AND IRON BINDING CAPACITY (CC-WL,HP ONLY)
Iron: 101 ug/dL (ref 45–182)
Saturation Ratios: 28 % (ref 17.9–39.5)
TIBC: 357 ug/dL (ref 250–450)
UIBC: 256 ug/dL (ref 117–376)

## 2021-11-30 LAB — FERRITIN: Ferritin: 15 ng/mL — ABNORMAL LOW (ref 24–336)

## 2021-11-30 NOTE — Telephone Encounter (Signed)
Auth Submission: VENOFER PAP PENDING Payer: UNINSURED Medication & CPT/J Code(s) submitted: Venofer (Iron Sucrose) J1756 Route of submission (phone, fax, portal): PHONE [phone] FAX: [phone]  Patient is uninsured and may eligible for IRON PAP. Forms have been faxed to Dr, Arbutus Ped, awaiting MD signature.  Will update once we receive a response.

## 2021-11-30 NOTE — Progress Notes (Signed)
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  PRIOR THERAPY: Iron infusion with Venofer 300 mg IV weekly for 3 weeks.  CURRENT THERAPY: Over-the-counter oral iron tablet with vitamin C.  INTERVAL HISTORY: Jeremy [name] 61 y.o. male returns to the clinic today for follow-up visit.  The patient is feeling fine today with no concerning complaints.  He felt much better after the iron infusion in November 2022.  He noticed a lump in his left breast started 3 weeks ago.  He has been taking multivitamin for women because it has higher iron supplements.  He denied having any chest pain, shortness of breath, cough or hemoptysis.  He has no nausea, vomiting, diarrhea or constipation.  He has no headache or visual changes.  He has no recent weight loss or night sweats.  The patient is here today for evaluation with repeat CBC, iron study and ferritin.   MEDICAL HISTORY: Past Medical History:  Diagnosis Date   Diabetes mellitus without complication (HCC)    GERD (gastroesophageal reflux disease)    Hyperlipidemia    Hypertension    IDA (iron deficiency anemia)    Rash of entire body 03/2016   Sleep apnea     ALLERGIES:  is allergic to cymbalta [duloxetine hcl], doxycycline, other, and robaxin [methocarbamol].  MEDICATIONS:  Current Outpatient Medications  Medication Sig Dispense Refill   acetaminophen (TYLENOL) 500 MG tablet Take 1,000 mg by mouth in the morning and at bedtime.      atorvastatin (LIPITOR) 80 MG tablet TAKE 1 TABLET (80 MG TOTAL) BY MOUTH DAILY. 90 tablet 2   Blood Glucose Monitoring Suppl (TRUE METRIX METER) w/Device KIT 1 each by Does not apply route 4 (four) times daily -  before meals and at bedtime. 1 kit 0   cyclobenzaprine (FLEXERIL) 10  MG tablet Take 1 tablet (10 mg total) by mouth 2 (two) times daily as needed for muscle spasms. 60 tablet 3   ferrous sulfate (FEROSUL) 325 (65 FE) MG tablet Take 1 tablet (325 mg total) by mouth 2 (two) times daily with a meal. 60 tablet 2   fluticasone (FLONASE) 50 MCG/ACT nasal spray Place 1 spray into both nostrils daily as needed for allergies or rhinitis. 16 g 0   glipiZIDE (GLUCOTROL) 5 MG tablet TAKE 1 TABLET BY MOUTH IN THE MORNINGS AND 1 TABLET IN THE EVENINGS. 60 tablet 2   glucose blood test strip Use as instructed 100 each 12   lisinopril (ZESTRIL) 40 MG tablet Take 1 tablet (40 mg total) by mouth daily. 30 tablet 2   naproxen sodium (ALEVE) 220 MG tablet Take 220 mg by mouth in the morning and at bedtime.     ondansetron (ZOFRAN) 8 MG tablet Take 1 tablet (8 mg total) by mouth daily as needed for nausea or vomiting. 30 tablet 0   pantoprazole (PROTONIX) 40 MG tablet TAKE 1 TABLET (40 MG TOTAL) BY MOUTH 2 (TWO) TIMES DAILY BEFORE A MEAL. 60 tablet 3   polyethylene glycol powder (GLYCOLAX/MIRALAX) 17 GM/SCOOP powder Take 17 g by mouth daily as needed. 510 g 1   Potassium 99 MG TABS Take 99 mg by mouth 2 (two) times daily.     rOPINIRole (REQUIP) 0.5 MG tablet TAKE 1 TABLET (0.5 MG TOTAL) BY MOUTH  AT BEDTIME. 90 tablet 1   sildenafil (VIAGRA) 50 MG tablet TAKE 1 TABLET 1/2 HR PRIOR TO SEX. LIMIT USE TO 1 TAB/24 HRS 10 tablet 3   sodium chloride 1 g tablet Take 2 tablets (2 g total) by mouth daily. 180 tablet 3   tamsulosin (FLOMAX) 0.4 MG CAPS capsule Take 1 capsule by mouth daily. 90 capsule 0   traMADol (ULTRAM) 50 MG tablet Take 1 tablet (50 mg total) by mouth every 12 (twelve) hours as needed (to fill on or after 07/21/2021). 60 tablet 2   vitamin B-12 (CYANOCOBALAMIN) 500 MCG tablet Take 1 tablet (500 mcg total) by mouth daily. 90 tablet 1   No current facility-administered medications for this visit.    SURGICAL HISTORY:  Past Surgical History:  Procedure Laterality Date    BACK SURGERY  1993   lumbar   BIOPSY  02/07/2019   Procedure: BIOPSY;  Surgeon: Danie Binder, MD;  [facility];  Service: Endoscopy;;   CERVICAL SPINE SURGERY  2000   COLONOSCOPY N/A 02/07/2019   Dr. Oneida Alar: External hemorrhoids next colonoscopy in 10 years   ESOPHAGOGASTRODUODENOSCOPY N/A 02/07/2019   Dr. Oneida Alar: Barrett's esophagus without dysplasia chronic inactive gastritis but no H. 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, nonbleeding duodenal diverticulum,   ESOPHAGOGASTRODUODENOSCOPY N/A 01/05/2020   Procedure: ESOPHAGOGASTRODUODENOSCOPY (EGD);  Surgeon: Danie Binder, MD;  [facility];  Service: Endoscopy;  Laterality: N/A;  10:30am   ESOPHAGOGASTRODUODENOSCOPY (EGD) WITH PROPOFOL N/A 12/06/2020   Procedure: ESOPHAGOGASTRODUODENOSCOPY (EGD) WITH PROPOFOL;  Surgeon: Eloise Harman, DO;  [facility];  Service: Endoscopy;  Laterality: N/A;  11:45am   GASTROJEJUNOSTOMY N/A 01/03/2021   Procedure: GASTROJEJUNOSTOMY;  Surgeon: Aviva Signs, MD;  Location: [facility];  Service: General;  Laterality: N/A;   HAND SURGERY     SAVORY DILATION N/A 01/05/2020   Procedure: SAVORY DILATION;  Surgeon: Danie Binder, MD;  [facility];  Service: Endoscopy;  Laterality: N/A;    REVIEW OF SYSTEMS:  A comprehensive review of systems was negative.   PHYSICAL EXAMINATION: General appearance: alert, cooperative, and no distress Head: Normocephalic, without obvious abnormality, atraumatic Neck: no adenopathy, no JVD, supple, symmetrical, trachea midline, and thyroid not enlarged, symmetric, no tenderness/mass/nodules Lymph nodes: Cervical, supraclavicular, and axillary nodes normal. Resp: clear to auscultation bilaterally Back: symmetric, no curvature. ROM normal. No CVA tenderness. Cardio: regular rate and rhythm, S1, S2 normal, no murmur, click, rub or gallop GI: soft, non-tender; bowel sounds  normal; no masses,  no organomegaly Extremities: extremities normal, atraumatic, no cyanosis or edema  ECOG PERFORMANCE STATUS: 1 - Symptomatic but completely ambulatory  Blood pressure (!) 130/54, pulse 78, temperature (!) 97.1 F (36.2 C), temperature source Tympanic, resp. rate 19, height 5' 10"  (1.778 m), weight 221 lb 6.4 oz (100.4 kg), SpO2 100 %.  LABORATORY DATA: Lab Results  Component Value Date   WBC 5.6 11/30/2021   HGB 12.8 (L) 11/30/2021   HCT 37.9 (L) 11/30/2021   MCV 92.9 11/30/2021   PLT 227 11/30/2021      Chemistry      Component Value Date/Time   NA 139 11/25/2021 1043   NA 138 05/17/2021 1657   K 4.4 11/25/2021 1043   CL 105 11/25/2021 1043   CO2 27 11/25/2021 1043   BUN 21 11/25/2021 1043   BUN 11 05/17/2021 1657   CREATININE 0.76 11/25/2021 1043   CREATININE 0.72  10/17/2016 1012      Component Value Date/Time   CALCIUM 9.3 11/25/2021 1043   ALKPHOS 87 03/18/2021 0945   AST 18 03/18/2021 0945   ALT 16 03/18/2021 0945   BILITOT 0.3 03/18/2021 0945       RADIOGRAPHIC STUDIES: No results found.  ASSESSMENT AND PLAN: This is a very pleasant 61 years old white male with iron deficiency anemia secondary to malabsorption secondary to gastrojejunostomy in March 2022. The patient was treated with iron infusion with Venofer 300 mg IV for 3 doses.  Last dose was given in November 2022. The patient tolerated the treatment well with no concerning adverse effects. He is feeling much better today. On the breast exam I did not palpate any concerning lumps in the left breast and I recommended for the patient to discontinue taking the One-A-Day for women and switch to regular multivitamins for men. I will see him back for follow-up visit in 3 months for evaluation with repeat CBC, iron study and ferritin. The patient was advised to call immediately if he has any other concerning symptoms in the interval. The patient voices understanding of current disease status  and treatment options and is in agreement with the current care plan. All questions were answered. The patient knows to call the clinic with any problems, questions or concerns. We can certainly see the patient much sooner if necessary. The total time spent in the appointment was 20 minutes.  Disclaimer: This note was dictated with voice recognition software. Similar sounding words can inadvertently be transcribed and may not be corrected upon review.

## 2021-12-01 ENCOUNTER — Encounter: Payer: Self-pay | Admitting: Internal Medicine

## 2021-12-01 NOTE — Telephone Encounter (Addendum)
VENOFER PAP - PRODUCT REPLACEMENT Patient has been approved for free drug (product replacment) Id# [id number] 12/01/21  -  11/30/22  Product replacement form must be faxed: [phone]  Product has been received and replaced back to stock.

## 2021-12-02 ENCOUNTER — Other Ambulatory Visit: Payer: Self-pay

## 2021-12-02 ENCOUNTER — Telehealth: Payer: Self-pay

## 2021-12-02 ENCOUNTER — Ambulatory Visit (INDEPENDENT_AMBULATORY_CARE_PROVIDER_SITE_OTHER): Payer: Self-pay

## 2021-12-02 VITALS — BP 122/67 | HR 69 | Temp 97.9°F | Resp 18 | Ht 70.0 in | Wt 226.2 lb

## 2021-12-02 DIAGNOSIS — D509 Iron deficiency anemia, unspecified: Secondary | ICD-10-CM

## 2021-12-02 MED ORDER — METHYLPREDNISOLONE SODIUM SUCC 125 MG IJ SOLR: 125.0000 mg | Freq: Once | INTRAMUSCULAR | Status: DC | PRN

## 2021-12-02 MED ORDER — SODIUM CHLORIDE 0.9 % IV SOLN: Freq: Once | INTRAVENOUS | Status: DC | PRN

## 2021-12-02 MED ORDER — SODIUM CHLORIDE 0.9% FLUSH: 3.0000 mL | Freq: Once | INTRAVENOUS | Status: DC | PRN

## 2021-12-02 MED ORDER — HEPARIN SOD (PORK) LOCK FLUSH 100 UNIT/ML IV SOLN: 250.0000 [IU] | Freq: Once | INTRAVENOUS | Status: DC | PRN

## 2021-12-02 MED ORDER — ALTEPLASE 2 MG IJ SOLR: 2.0000 mg | Freq: Once | INTRAMUSCULAR | Status: DC | PRN

## 2021-12-02 MED ORDER — HEPARIN SOD (PORK) LOCK FLUSH 100 UNIT/ML IV SOLN: 500.0000 [IU] | Freq: Once | INTRAVENOUS | Status: DC | PRN

## 2021-12-02 MED ORDER — SODIUM CHLORIDE 0.9 % IV SOLN
300.0000 mg | Freq: Once | INTRAVENOUS | Status: AC
  Administered 2021-12-02: 300 mg via INTRAVENOUS
  Filled 2021-12-02: qty 15

## 2021-12-02 MED ORDER — DIPHENHYDRAMINE HCL 50 MG/ML IJ SOLN: 50.0000 mg | Freq: Once | INTRAMUSCULAR | Status: DC | PRN

## 2021-12-02 MED ORDER — FAMOTIDINE IN NACL 20-0.9 MG/50ML-% IV SOLN: 20.0000 mg | Freq: Once | INTRAVENOUS | Status: DC | PRN

## 2021-12-02 MED ORDER — SODIUM CHLORIDE 0.9% FLUSH: 10.0000 mL | Freq: Once | INTRAVENOUS | Status: DC | PRN

## 2021-12-02 MED ORDER — ANTICOAGULANT SODIUM CITRATE 4% (200MG/5ML) IV SOLN: 5.0000 mL | Freq: Once | Status: DC | PRN

## 2021-12-02 MED ORDER — ALBUTEROL SULFATE HFA 108 (90 BASE) MCG/ACT IN AERS: 2.0000 | INHALATION_SPRAY | Freq: Once | RESPIRATORY_TRACT | Status: DC | PRN

## 2021-12-02 MED ORDER — EPINEPHRINE 0.3 MG/0.3ML IJ SOAJ: 0.3000 mg | Freq: Once | INTRAMUSCULAR | Status: DC | PRN

## 2021-12-02 NOTE — Telephone Encounter (Signed)
I have left a detailed message for the pt advising of this. Pt is already scheduled for his infusions at [facility].

## 2021-12-02 NOTE — Telephone Encounter (Signed)
-----   Message from Si Gaul, MD sent at 11/30/2021  1:19 PM EST ----- Please let the patient knows that his ferritin level was low and I did order iron infusion at the [facility] to start next week for 3 doses.  Thank you. ----- Message ----- From: Leory Plowman, Lab In [place] Sent: 11/30/2021  10:26 AM EST To: Si Gaul, MD

## 2021-12-02 NOTE — Progress Notes (Signed)
Diagnosis: Iron Deficiency Anemia  Provider:  Chilton Greathouse, MD  Procedure: Infusion  IV Type: Peripheral, IV Location: L Antecubital  Venofer (Iron Sucrose), Dose: 300 mg  Infusion Start Time: 1059  Infusion Stop Time: 1240  Post Infusion IV Care: Observation period completed and Peripheral IV Discontinued  Discharge: Condition: Good, Destination: Home . AVS provided to patient.   Performed by:  Adriana Mccallum, RN

## 2021-12-05 ENCOUNTER — Other Ambulatory Visit: Payer: Self-pay | Admitting: Internal Medicine

## 2021-12-06 ENCOUNTER — Encounter: Payer: Self-pay | Admitting: Physician Assistant

## 2021-12-06 ENCOUNTER — Other Ambulatory Visit (HOSPITAL_COMMUNITY): Payer: Self-pay

## 2021-12-06 ENCOUNTER — Encounter: Payer: Self-pay | Admitting: Internal Medicine

## 2021-12-06 NOTE — Telephone Encounter (Signed)
Requested medication (s) are due for refill today: Yes  Requested medication (s) are on the active medication list: Yes  Last refill:  08/22/21  Future visit scheduled: Yes  Notes to clinic:  Unable to refill per protocol, cannot delegate.      Requested Prescriptions  Pending Prescriptions Disp Refills   traMADol (ULTRAM) 50 MG tablet 60 tablet 2    Sig: Take 1 tablet (50 mg total) by mouth every 12 (twelve) hours as needed (to fill on or after 07/21/2021).     Not Delegated - Analgesics:  Opioid Agonists Failed - 12/05/2021 11:24 PM      Failed - This refill cannot be delegated      Failed - Urine Drug Screen completed in last 360 days      Failed - Valid encounter within last 3 months    Recent Outpatient Visits           3 months ago Type 2 diabetes mellitus with obesity (HCC)   [facility] Jonah Blue B, MD   6 months ago Type 2 diabetes mellitus with obesity [facility])   [place] [facility] Marcine Matar, MD   8 months ago Dizziness   [facility] Marcine Matar, MD   11 months ago Preoperative evaluation to rule out surgical contraindication   [facility] Marcine Matar, MD   1 year ago Acute pain of right knee   [facility] Marcine Matar, MD       Future Appointments             In 1 week Gelene Mink, NP [facility]   In 3 weeks Marcine Matar, MD [facility]

## 2021-12-07 ENCOUNTER — Other Ambulatory Visit (HOSPITAL_COMMUNITY): Payer: Self-pay

## 2021-12-07 ENCOUNTER — Encounter: Payer: Self-pay | Admitting: Internal Medicine

## 2021-12-07 ENCOUNTER — Encounter: Payer: Self-pay | Admitting: Physician Assistant

## 2021-12-07 MED ORDER — TRAMADOL HCL 50 MG PO TABS
50.0000 mg | ORAL_TABLET | Freq: Two times a day (BID) | ORAL | 2 refills | Status: DC | PRN
  Filled 2021-12-07: qty 60, 30d supply, fill #0
  Filled 2022-01-04: qty 60, 30d supply, fill #1
  Filled 2022-01-09: qty 60, 30d supply, fill #0
  Filled 2022-02-12: qty 60, 30d supply, fill #1

## 2021-12-09 ENCOUNTER — Ambulatory Visit (INDEPENDENT_AMBULATORY_CARE_PROVIDER_SITE_OTHER): Payer: Self-pay

## 2021-12-09 ENCOUNTER — Other Ambulatory Visit: Payer: Self-pay

## 2021-12-09 VITALS — BP 127/64 | HR 76 | Temp 97.8°F | Resp 18 | Ht 70.0 in | Wt 227.6 lb

## 2021-12-09 DIAGNOSIS — D509 Iron deficiency anemia, unspecified: Secondary | ICD-10-CM

## 2021-12-09 MED ORDER — SODIUM CHLORIDE 0.9 % IV SOLN: Freq: Once | INTRAVENOUS | Status: DC | PRN

## 2021-12-09 MED ORDER — METHYLPREDNISOLONE SODIUM SUCC 125 MG IJ SOLR: 125.0000 mg | Freq: Once | INTRAMUSCULAR | Status: DC | PRN

## 2021-12-09 MED ORDER — DIPHENHYDRAMINE HCL 50 MG/ML IJ SOLN: 50.0000 mg | Freq: Once | INTRAMUSCULAR | Status: DC | PRN

## 2021-12-09 MED ORDER — FAMOTIDINE IN NACL 20-0.9 MG/50ML-% IV SOLN: 20.0000 mg | Freq: Once | INTRAVENOUS | Status: DC | PRN

## 2021-12-09 MED ORDER — EPINEPHRINE 0.3 MG/0.3ML IJ SOAJ: 0.3000 mg | Freq: Once | INTRAMUSCULAR | Status: DC | PRN

## 2021-12-09 MED ORDER — ALBUTEROL SULFATE HFA 108 (90 BASE) MCG/ACT IN AERS: 2.0000 | INHALATION_SPRAY | Freq: Once | RESPIRATORY_TRACT | Status: DC | PRN

## 2021-12-09 MED ORDER — SODIUM CHLORIDE 0.9 % IV SOLN
300.0000 mg | Freq: Once | INTRAVENOUS | Status: AC
  Administered 2021-12-09: 300 mg via INTRAVENOUS
  Filled 2021-12-09: qty 15

## 2021-12-09 NOTE — Progress Notes (Addendum)
Diagnosis: Iron Deficiency Anemia  Provider:  Chilton Greathouse, MD  Procedure: Infusion  IV Type: Peripheral, IV Location: L Antecubital  Venofer (Iron Sucrose), Dose: 300 mg  Infusion Start Time: 1046  Infusion Stop Time: 1225  Post Infusion IV Care: Peripheral IV Discontinued  Discharge: Condition: Good, Destination: Home . AVS provided to patient.   Performed by:  Adriana Mccallum, RN

## 2021-12-13 ENCOUNTER — Other Ambulatory Visit: Payer: Self-pay

## 2021-12-13 ENCOUNTER — Telehealth (INDEPENDENT_AMBULATORY_CARE_PROVIDER_SITE_OTHER): Payer: Self-pay | Admitting: Gastroenterology

## 2021-12-13 ENCOUNTER — Encounter: Payer: Self-pay | Admitting: Gastroenterology

## 2021-12-13 ENCOUNTER — Encounter: Payer: Self-pay | Admitting: *Deleted

## 2021-12-13 ENCOUNTER — Telehealth: Payer: Self-pay | Admitting: *Deleted

## 2021-12-13 VITALS — Ht 70.0 in | Wt 224.0 lb

## 2021-12-13 DIAGNOSIS — D509 Iron deficiency anemia, unspecified: Secondary | ICD-10-CM

## 2021-12-13 DIAGNOSIS — K2270 Barrett's esophagus without dysplasia: Secondary | ICD-10-CM

## 2021-12-13 NOTE — Progress Notes (Signed)
Primary Care Physician:  [name], MD  Primary GI: Dr. Abbey Chatters   Patient Location: Home   Provider Location: [facility] office   Reason for Visit: Follow-up   Persons present on the virtual encounter, with roles: Patient and NP   Total time (minutes) spent on medical discussion: 15 minutes   Due to COVID-19, visit was conducted using virtual method.  Visit was requested by patient.  Virtual Visit via MyChart Video Note Due to COVID-19, visit is conducted virtually and was requested by patient.   I connected with Jeremy [name] on 12/13/21 at  4:00 PM EST by video and verified that I am speaking with the correct person using two identifiers.   I discussed the limitations, risks, security and privacy concerns of performing an evaluation and management service by video and the availability of in person appointments. I also discussed with the patient that there may be a patient responsible charge related to this service. The patient expressed understanding and agreed to proceed.  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  This failed conservative management with PPI therapy and dilation.  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 Arnoldo Morale on 01/03/2021.  He also has a history of Barrett's on EGD in 2021. No esophagitis noted on EGD in 2022. Last esophageal biopsies in 2020. He has been dealing with IDA since procedure. Requiring iron infusions.   Will go without  BM for 4-5 days, then will have BMs all day but not loose. No fiber. No abdominal pain. Sometimes BM not as much and sometimes a lot. No rectal bleeding. Stool is usually green.   No dysphagia. Protonix 2 a week. Takes Tuesday evening and Friday evening. Lots of burping when eating. No  vomiting. Burps multiple times after eating. Anything dairy will cause gas. If takes Protonix daily, feels like a brick in his stomach.    Past Medical History:  Diagnosis Date   Diabetes mellitus without complication (HCC)    GERD (gastroesophageal reflux disease)    Hyperlipidemia    Hypertension    IDA (iron deficiency anemia)    Rash of entire body 03/2016   Sleep apnea      Past Surgical History:  Procedure Laterality Date   BACK SURGERY  1993   lumbar   BIOPSY  02/07/2019   Procedure: BIOPSY;  Surgeon: Danie Binder, MD;  Location: [facility];  Service: Endoscopy;;   CERVICAL SPINE SURGERY  2000   COLONOSCOPY N/A 02/07/2019   Dr. Oneida Alar: External hemorrhoids next colonoscopy in 10 years   ESOPHAGOGASTRODUODENOSCOPY N/A 02/07/2019   Dr. Oneida Alar: Barrett's esophagus without dysplasia chronic inactive gastritis but no H. 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, nonbleeding duodenal diverticulum,   ESOPHAGOGASTRODUODENOSCOPY N/A 01/05/2020   Procedure: ESOPHAGOGASTRODUODENOSCOPY (EGD);  Surgeon: Danie Binder, MD;  Location: [facility];  Service: Endoscopy;  Laterality: N/A;  10:30am   ESOPHAGOGASTRODUODENOSCOPY (EGD) WITH PROPOFOL N/A 12/06/2020   Procedure: ESOPHAGOGASTRODUODENOSCOPY (EGD) WITH PROPOFOL;  Surgeon: Eloise Harman, DO;  Location: [facility];  Service: Endoscopy;  Laterality: N/A;  11:45am   GASTROJEJUNOSTOMY N/A 01/03/2021   Procedure: GASTROJEJUNOSTOMY;  Surgeon: Aviva Signs, MD;  Location: [facility];  Service: General;  Laterality: N/A;   HAND SURGERY     SAVORY DILATION N/A 01/05/2020   Procedure: SAVORY DILATION;  Surgeon: Danie Binder, MD;  Location: [facility];  Service: Endoscopy;  Laterality: N/A;     Current Meds  Medication Sig   acetaminophen (TYLENOL) 500 MG tablet Take 1,000 mg by mouth in the morning and at bedtime.    aluminum-magnesium hydroxide-simethicone (MAALOX)  381-829-93 MG/5ML SUSP Take 30 mLs by mouth once. At night   Blood Glucose Monitoring Suppl (TRUE METRIX METER) w/Device KIT 1 each by Does not apply route 4 (four) times daily -  before meals and at bedtime.   cyclobenzaprine (FLEXERIL) 10 MG tablet Take 1 tablet (10 mg total) by mouth 2 (two) times daily as needed for muscle spasms.   ferrous sulfate (FEROSUL) 325 (65 FE) MG tablet Take 1 tablet (325 mg total) by mouth 2 (two) times daily with a meal.   fluticasone (FLONASE) 50 MCG/ACT nasal spray Place 1 spray into both nostrils daily as needed for allergies or rhinitis.   glipiZIDE (GLUCOTROL) 5 MG tablet TAKE 1 TABLET BY MOUTH IN THE MORNINGS AND 1 TABLET IN THE EVENINGS.   glucose blood test strip Use as instructed   lisinopril (ZESTRIL) 40 MG tablet Take 1 tablet (40 mg total) by mouth daily.   naproxen sodium (ALEVE) 220 MG tablet Take 220 mg by mouth in the morning and at bedtime.   ondansetron (ZOFRAN) 8 MG tablet Take 1 tablet (8 mg total) by mouth daily as needed for nausea or vomiting.   pantoprazole (PROTONIX) 40 MG tablet TAKE 1 TABLET (40 MG TOTAL) BY MOUTH 2 (TWO) TIMES DAILY BEFORE A MEAL.   Potassium 99 MG TABS Take 99 mg by mouth 2 (two) times daily.   rOPINIRole (REQUIP) 0.5 MG tablet TAKE 1 TABLET (0.5 MG TOTAL) BY MOUTH AT BEDTIME.   sildenafil (VIAGRA) 50 MG tablet TAKE 1 TABLET 1/2 HR PRIOR TO SEX. LIMIT USE TO 1 TAB/24 HRS   sodium chloride 1 g tablet Take 2 tablets (2 g total) by mouth daily.   tamsulosin (FLOMAX) 0.4 MG CAPS capsule Take 1 capsule by mouth daily.   traMADol (ULTRAM) 50 MG tablet Take 1 tablet (50 mg total) by mouth every 12 (twelve) hours as needed   vitamin B-12 (CYANOCOBALAMIN) 500 MCG tablet Take 1 tablet (500 mcg total) by mouth daily.     Family History  Problem Relation Age of Onset   Pancreatic cancer Mother    CAD Father    Colon cancer Paternal Grandfather     Social History   Socioeconomic History   Marital status: Significant Other     Spouse name: Not on file   Number of children: Not on file   Years of education: Not on file   Highest education level: Not on file  Occupational History   Occupation: general contractor  Tobacco Use   Smoking status: Former    Types: Cigarettes    Quit date: 10/16/1988    Years since quitting: 33.1   Smokeless tobacco: Never  Vaping Use   Vaping Use: Never used  Substance and Sexual Activity   Alcohol use: No   Drug use: Yes    Comment: rare   Sexual activity: Not on file  Other Topics Concern   Not on file  Social History Narrative   Volunteers as Airline pilot   Social Determinants of Health   Financial Resource Strain: Not on file  Food  Insecurity: Not on file  Transportation Needs: Not on file  Physical Activity: Not on file  Stress: Not on file  Social Connections: Not on file       Review of Systems: Gen: Denies fever, chills, anorexia. Denies fatigue, weakness, weight loss.  CV: Denies chest pain, palpitations, syncope, peripheral edema, and claudication. Resp: Denies dyspnea at rest, cough, wheezing, coughing up blood, and pleurisy. GI: see HPI Derm: Denies rash, itching, dry skin Psych: Denies depression, anxiety, memory loss, confusion. No homicidal or suicidal ideation.  Heme: Denies bruising, bleeding, and enlarged lymph nodes.  Observations/Objective: No distress. Unable to perform physical exam due to video encounter.   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  He also has a history of Barrett's on EGD in 2021. No esophagitis noted on EGD in 2022. Last esophageal biopsies in 2020. He has been dealing with IDA since procedure and requiring iron infusions. Now also noting constipation.   Dyspepsia: reporting  burping after eating but not taking PPI daily. No dysphagia. He states taking Protonix daily will cause a "brick feeling" in stomach. Recommend EGD for evaluation and can also serve as surveillance for Barrett's.  Constipation: start fiber daily. May need more aggressive regimen.    Follow Up Instructions:  Proceed with upper endoscopy by Dr. Abbey Chatters in near future: the risks, benefits, and alternatives have been discussed with the patient in detail. The patient states understanding and desires to proceed.  2.  Recommend PPI daily: patient desires to wait until after procedure 3. Start Benefiber daily.     I discussed the assessment and treatment plan with the patient. The patient was provided an opportunity to ask questions and all were answered. The patient agreed with the plan and demonstrated an understanding of the instructions.   The patient was advised to call back or seek an in-person evaluation if the symptoms worsen or if the condition fails to improve as anticipated.  I provided 15 minutes of face-to-face time during this MyChart Video encounter.  Annitta Needs, PhD, ANP-BC [facility]

## 2021-12-13 NOTE — H&P (View-Only) (Signed)
Primary Care Physician:  [name], MD  Primary GI: Dr. Abbey Chatters   Patient Location: Home   Provider Location: [facility] office   Reason for Visit: Follow-up   Persons present on the virtual encounter, with roles: Patient and NP   Total time (minutes) spent on medical discussion: 15 minutes   Due to COVID-19, visit was conducted using virtual method.  Visit was requested by patient.  Virtual Visit via MyChart Video Note Due to COVID-19, visit is conducted virtually and was requested by patient.   I connected with Jeremy [name] on 12/13/21 at  4:00 PM EST by video and verified that I am speaking with the correct person using two identifiers.   I discussed the limitations, risks, security and privacy concerns of performing an evaluation and management service by video and the availability of in person appointments. I also discussed with the patient that there may be a patient responsible charge related to this service. The patient expressed understanding and agreed to proceed.  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  This failed conservative management with PPI therapy and dilation.  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 Arnoldo Morale on 01/03/2021.  He also has a history of Barrett's on EGD in 2021. No esophagitis noted on EGD in 2022. Last esophageal biopsies in 2020. He has been dealing with IDA since procedure. Requiring iron infusions.   Will go without  BM for 4-5 days, then will have BMs all day but not loose. No fiber. No abdominal pain. Sometimes BM not as much and sometimes a lot. No rectal bleeding. Stool is usually green.   No dysphagia. Protonix 2 a week. Takes Tuesday evening and Friday evening. Lots of burping when eating. No  vomiting. Burps multiple times after eating. Anything dairy will cause gas. If takes Protonix daily, feels like a brick in his stomach.    Past Medical History:  Diagnosis Date   Diabetes mellitus without complication (HCC)    GERD (gastroesophageal reflux disease)    Hyperlipidemia    Hypertension    IDA (iron deficiency anemia)    Rash of entire body 03/2016   Sleep apnea      Past Surgical History:  Procedure Laterality Date   BACK SURGERY  1993   lumbar   BIOPSY  02/07/2019   Procedure: BIOPSY;  Surgeon: Danie Binder, MD;  [facility]: [facility];  Service: Endoscopy;;   CERVICAL SPINE SURGERY  2000   COLONOSCOPY N/A 02/07/2019   Dr. Oneida Alar: External hemorrhoids next colonoscopy in 10 years   ESOPHAGOGASTRODUODENOSCOPY N/A 02/07/2019   Dr. Oneida Alar: Barrett's esophagus without dysplasia chronic inactive gastritis but no H. 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, nonbleeding duodenal diverticulum,   ESOPHAGOGASTRODUODENOSCOPY N/A 01/05/2020   Procedure: ESOPHAGOGASTRODUODENOSCOPY (EGD);  Surgeon: Danie Binder, MD;  Location: [facility];  Service: Endoscopy;  Laterality: N/A;  10:30am   ESOPHAGOGASTRODUODENOSCOPY (EGD) WITH PROPOFOL N/A 12/06/2020   Procedure: ESOPHAGOGASTRODUODENOSCOPY (EGD) WITH PROPOFOL;  Surgeon: Eloise Harman, DO;  Location: [facility];  Service: Endoscopy;  Laterality: N/A;  11:45am   GASTROJEJUNOSTOMY N/A 01/03/2021   Procedure: GASTROJEJUNOSTOMY;  Surgeon: Aviva Signs, MD;  Location: [facility];  Service: General;  Laterality: N/A;   HAND SURGERY     SAVORY DILATION N/A 01/05/2020   Procedure: SAVORY DILATION;  Surgeon: Danie Binder, MD;  Location: [facility];  Service: Endoscopy;  Laterality: N/A;     Current Meds  Medication Sig   acetaminophen (TYLENOL) 500 MG tablet Take 1,000 mg by mouth in the morning and at bedtime.    aluminum-magnesium hydroxide-simethicone (MAALOX)  361-224-49 MG/5ML SUSP Take 30 mLs by mouth once. At night   Blood Glucose Monitoring Suppl (TRUE METRIX METER) w/Device KIT 1 each by Does not apply route 4 (four) times daily -  before meals and at bedtime.   cyclobenzaprine (FLEXERIL) 10 MG tablet Take 1 tablet (10 mg total) by mouth 2 (two) times daily as needed for muscle spasms.   ferrous sulfate (FEROSUL) 325 (65 FE) MG tablet Take 1 tablet (325 mg total) by mouth 2 (two) times daily with a meal.   fluticasone (FLONASE) 50 MCG/ACT nasal spray Place 1 spray into both nostrils daily as needed for allergies or rhinitis.   glipiZIDE (GLUCOTROL) 5 MG tablet TAKE 1 TABLET BY MOUTH IN THE MORNINGS AND 1 TABLET IN THE EVENINGS.   glucose blood test strip Use as instructed   lisinopril (ZESTRIL) 40 MG tablet Take 1 tablet (40 mg total) by mouth daily.   naproxen sodium (ALEVE) 220 MG tablet Take 220 mg by mouth in the morning and at bedtime.   ondansetron (ZOFRAN) 8 MG tablet Take 1 tablet (8 mg total) by mouth daily as needed for nausea or vomiting.   pantoprazole (PROTONIX) 40 MG tablet TAKE 1 TABLET (40 MG TOTAL) BY MOUTH 2 (TWO) TIMES DAILY BEFORE A MEAL.   Potassium 99 MG TABS Take 99 mg by mouth 2 (two) times daily.   rOPINIRole (REQUIP) 0.5 MG tablet TAKE 1 TABLET (0.5 MG TOTAL) BY MOUTH AT BEDTIME.   sildenafil (VIAGRA) 50 MG tablet TAKE 1 TABLET 1/2 HR PRIOR TO SEX. LIMIT USE TO 1 TAB/24 HRS   sodium chloride 1 g tablet Take 2 tablets (2 g total) by mouth daily.   tamsulosin (FLOMAX) 0.4 MG CAPS capsule Take 1 capsule by mouth daily.   traMADol (ULTRAM) 50 MG tablet Take 1 tablet (50 mg total) by mouth every 12 (twelve) hours as needed   vitamin B-12 (CYANOCOBALAMIN) 500 MCG tablet Take 1 tablet (500 mcg total) by mouth daily.     Family History  Problem Relation Age of Onset   Pancreatic cancer Mother    CAD Father    Colon cancer Paternal Grandfather     Social History   Socioeconomic History   Marital status: Significant Other     Spouse name: Not on file   Number of children: Not on file   Years of education: Not on file   Highest education level: Not on file  Occupational History   Occupation: general contractor  Tobacco Use   Smoking status: Former    Types: Cigarettes    Quit date: 10/16/1988    Years since quitting: 33.1   Smokeless tobacco: Never  Vaping Use   Vaping Use: Never used  Substance and Sexual Activity   Alcohol use: No   Drug use: Yes    Comment: rare   Sexual activity: Not on file  Other Topics Concern   Not on file  Social History Narrative   Volunteers as Airline pilot   Social Determinants of Health   Financial Resource Strain: Not on file  Food  Insecurity: Not on file  Transportation Needs: Not on file  Physical Activity: Not on file  Stress: Not on file  Social Connections: Not on file       Review of Systems: Gen: Denies fever, chills, anorexia. Denies fatigue, weakness, weight loss.  CV: Denies chest pain, palpitations, syncope, peripheral edema, and claudication. Resp: Denies dyspnea at rest, cough, wheezing, coughing up blood, and pleurisy. GI: see HPI Derm: Denies rash, itching, dry skin Psych: Denies depression, anxiety, memory loss, confusion. No homicidal or suicidal ideation.  Heme: Denies bruising, bleeding, and enlarged lymph nodes.  Observations/Objective: No distress. Unable to perform physical exam due to video encounter.   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  He also has a history of Barrett's on EGD in 2021. No esophagitis noted on EGD in 2022. Last esophageal biopsies in 2020. He has been dealing with IDA since procedure and requiring iron infusions. Now also noting constipation.   Dyspepsia: reporting  burping after eating but not taking PPI daily. No dysphagia. He states taking Protonix daily will cause a "brick feeling" in stomach. Recommend EGD for evaluation and can also serve as surveillance for Barrett's.  Constipation: start fiber daily. May need more aggressive regimen.    Follow Up Instructions:  Proceed with upper endoscopy by Dr. Abbey Chatters in near future: the risks, benefits, and alternatives have been discussed with the patient in detail. The patient states understanding and desires to proceed.  2.  Recommend PPI daily: patient desires to wait until after procedure 3. Start Benefiber daily.     I discussed the assessment and treatment plan with the patient. The patient was provided an opportunity to ask questions and all were answered. The patient agreed with the plan and demonstrated an understanding of the instructions.   The patient was advised to call back or seek an in-person evaluation if the symptoms worsen or if the condition fails to improve as anticipated.  I provided 15 minutes of face-to-face time during this MyChart Video encounter.  Annitta Needs, PhD, ANP-BC [facility]

## 2021-12-13 NOTE — Patient Instructions (Signed)
We are arranging an upper endoscopy in the near future with Dr. Marletta Lor.  I recommend taking Benefiber 2 teaspoons each day in the beverage of your choice. This mixes up well and is tasteless.   We will see you in 3 months!  It was a pleasure to see you today. I want to create trusting relationships with patients to provide genuine, compassionate, and quality care. I value your feedback. If you receive a survey regarding your visit,  I greatly appreciate you taking time to fill this out.   Gelene Mink, PhD, ANP-BC [facility]

## 2021-12-13 NOTE — Telephone Encounter (Signed)
Jeremy [name], you are scheduled for a virtual visit with your provider today.  Just as we do with appointments in the office, we must obtain your consent to participate.  Your consent will be active for this visit and any virtual visit you may have with one of our providers in the next 365 days.  If you have a MyChart account, I can also send a copy of this consent to you electronically.  All virtual visits are billed to your insurance company just like a traditional visit in the office.  As this is a virtual visit, video technology does not allow for your provider to perform a traditional examination.  This may limit your provider's ability to fully assess your condition.  If your provider identifies any concerns that need to be evaluated in person or the need to arrange testing such as labs, EKG, etc, we will make arrangements to do so.  Although advances in technology are sophisticated, we cannot ensure that it will always work on either your end or our end.  If the connection with a video visit is poor, we may have to switch to a telephone visit.  With either a video or telephone visit, we are not always able to ensure that we have a secure connection.   I need to obtain your verbal consent now.   Are you willing to proceed with your visit today?

## 2021-12-13 NOTE — Telephone Encounter (Signed)
Pt consented to a virtual visit. 

## 2021-12-13 NOTE — Telephone Encounter (Signed)
Called pt. Scheduled for EGD with propofol asa 3 on 3/9 at 10:45am. Aware will call back with pre-op appt. Will send instructions via mychart.

## 2021-12-16 ENCOUNTER — Other Ambulatory Visit: Payer: Self-pay

## 2021-12-16 ENCOUNTER — Ambulatory Visit (INDEPENDENT_AMBULATORY_CARE_PROVIDER_SITE_OTHER): Payer: Self-pay

## 2021-12-16 VITALS — BP 134/71 | HR 62 | Temp 98.0°F | Resp 18 | Ht 70.0 in | Wt 230.6 lb

## 2021-12-16 DIAGNOSIS — D509 Iron deficiency anemia, unspecified: Secondary | ICD-10-CM

## 2021-12-16 MED ORDER — FAMOTIDINE IN NACL 20-0.9 MG/50ML-% IV SOLN: 20.0000 mg | Freq: Once | INTRAVENOUS | Status: DC | PRN

## 2021-12-16 MED ORDER — EPINEPHRINE 0.3 MG/0.3ML IJ SOAJ: 0.3000 mg | Freq: Once | INTRAMUSCULAR | Status: DC | PRN

## 2021-12-16 MED ORDER — ALBUTEROL SULFATE HFA 108 (90 BASE) MCG/ACT IN AERS: 2.0000 | INHALATION_SPRAY | Freq: Once | RESPIRATORY_TRACT | Status: DC | PRN

## 2021-12-16 MED ORDER — METHYLPREDNISOLONE SODIUM SUCC 125 MG IJ SOLR: 125.0000 mg | Freq: Once | INTRAMUSCULAR | Status: DC | PRN

## 2021-12-16 MED ORDER — SODIUM CHLORIDE 0.9 % IV SOLN
300.0000 mg | Freq: Once | INTRAVENOUS | Status: AC
  Administered 2021-12-16: 300 mg via INTRAVENOUS
  Filled 2021-12-16: qty 15

## 2021-12-16 MED ORDER — DIPHENHYDRAMINE HCL 50 MG/ML IJ SOLN: 50.0000 mg | Freq: Once | INTRAMUSCULAR | Status: DC | PRN

## 2021-12-16 MED ORDER — SODIUM CHLORIDE 0.9 % IV SOLN: Freq: Once | INTRAVENOUS | Status: DC | PRN

## 2021-12-16 NOTE — Progress Notes (Signed)
Diagnosis: Iron Deficiency Anemia ? ?Provider:  Chilton Greathouse, MD ? ?Procedure: Infusion ? ?IV Type: Peripheral, IV Location: L Antecubital ? ?Venofer (Iron Sucrose), 300mg  ? ?Infusion Start Time: 1101 ? ?Infusion Stop Time: 1235 ? ?Post Infusion IV Care: Peripheral IV Discontinued ? ?Discharge: Condition: Good, Destination: Home . AVS provided to patient.  ? ?Performed by:  , RN  ?  ?

## 2021-12-16 NOTE — Patient Instructions (Signed)
? ? ? ? ? ? Annye Asa ? 12/16/2021  ?  ? @PREFPERIOPPHARMACY @ ? ? Your procedure is scheduled on  12/22/2021. ? ? Report to 02/21/2022 at  0845  A.M. ? ? Call this number if you have problems the morning of surgery: ? [phone] ? ? Remember: ? Follow the diet instructions given to you by the office. ?  ? Take these medicines the morning of surgery with A SIP OF WATER  ? ?  Flexeril(if needed), zofran (if needed), protonix, flomax, ultram. ?  ? ? Do not wear jewelry, make-up or nail polish. ? Do not wear lotions, powders, or perfumes, or deodorant. ? Do not shave 48 hours prior to surgery.  Men may shave face and neck. ? Do not bring valuables to the hospital. ? [facility] is not responsible for any belongings or valuables. ? ?Contacts, dentures or bridgework may not be worn into surgery.  Leave your suitcase in the car.  After surgery it may be brought to your room. ? ?For patients admitted to the hospital, discharge time will be determined by your treatment team. ? ?Patients discharged the day of surgery will not be allowed to drive home and must have someone with them for 24 hours.  ? ? ?Special instructions:   DO NOT smoke tobacco or vape for 24 hours before your procedure. ? ?Please read over the following fact sheets that you were given. ?Anesthesia Post-op Instructions and Care and Recovery After Surgery ?  ? ? ? Upper Endoscopy, Adult, Care After ?This sheet gives you information about how to care for yourself after your procedure. Your health care provider may also give you more specific instructions. If you have problems or questions, contact your health care provider. ?What can I expect after the procedure? ?After the procedure, it is common to have: ?A sore throat. ?Mild stomach pain or discomfort. ?Bloating. ?Nausea. ?Follow these instructions at home: ? ?Follow instructions from your health care provider about what to eat or drink after your procedure. ?Return to your normal activities as told by  your health care provider. Ask your health care provider what activities are safe for you. ?Take over-the-counter and prescription medicines only as told by your health care provider. ?If you were given a sedative during the procedure, it can affect you for several hours. Do not drive or operate machinery until your health care provider says that it is safe. ?Keep all follow-up visits as told by your health care provider. This is important. ?Contact a health care provider if you have: ?A sore throat that lasts longer than one day. ?Trouble swallowing. ?Get help right away if: ?You vomit blood or your vomit looks like coffee grounds. ?You have: ?A fever. ?Bloody, black, or tarry stools. ?A severe sore throat or you cannot swallow. ?Difficulty breathing. ?Severe pain in your chest or abdomen. ?Summary ?After the procedure, it is common to have a sore throat, mild stomach discomfort, bloating, and nausea. ?If you were given a sedative during the procedure, it can affect you for several hours. Do not drive or operate machinery until your health care provider says that it is safe. ?Follow instructions from your health care provider about what to eat or drink after your procedure. ?Return to your normal activities as told by your health care provider. ?This information is not intended to replace advice given to you by your health care provider. Make sure you discuss any questions you have with your health care provider. ?Document  Revised: 08/08/2019 Document Reviewed: 03/04/2018 ?Elsevier Patient Education ? 2022 [employer] ?Monitored Anesthesia Care, Care After ?This sheet gives you information about how to care for yourself after your procedure. Your health care provider may also give you more specific instructions. If you have problems or questions, contact your health care provider. ?What can I expect after the procedure? ?After the procedure, it is common to have: ?Tiredness. ?Forgetfulness about what happened  after the procedure. ?Impaired judgment for important decisions. ?Nausea or vomiting. ?Some difficulty with balance. ?Follow these instructions at home: ?For the time period you were told by your health care provider: ?  ?Rest as needed. ?Do not participate in activities where you could fall or become injured. ?Do not drive or use machinery. ?Do not drink alcohol. ?Do not take sleeping pills or medicines that cause drowsiness. ?Do not make important decisions or sign legal documents. ?Do not take care of children on your own. ?Eating and drinking ?Follow the diet that is recommended by your health care provider. ?Drink enough fluid to keep your urine pale yellow. ?If you vomit: ?Drink water, juice, or soup when you can drink without vomiting. ?Make sure you have little or no nausea before eating solid foods. ?General instructions ?Have a responsible adult stay with you for the time you are told. It is important to have someone help care for you until you are awake and alert. ?Take over-the-counter and prescription medicines only as told by your health care provider. ?If you have sleep apnea, surgery and certain medicines can increase your risk for breathing problems. Follow instructions from your health care provider about wearing your sleep device: ?Anytime you are sleeping, including during daytime naps. ?While taking prescription pain medicines, sleeping medicines, or medicines that make you drowsy. ?Avoid smoking. ?Keep all follow-up visits as told by your health care provider. This is important. ?Contact a health care provider if: ?You keep feeling nauseous or you keep vomiting. ?You feel light-headed. ?You are still sleepy or having trouble with balance after 24 hours. ?You develop a rash. ?You have a fever. ?You have redness or swelling around the IV site. ?Get help right away if: ?You have trouble breathing. ?You have new-onset confusion at home. ?Summary ?For several hours after your procedure, you may feel  tired. You may also be forgetful and have poor judgment. ?Have a responsible adult stay with you for the time you are told. It is important to have someone help care for you until you are awake and alert. ?Rest as told. Do not drive or operate machinery. Do not drink alcohol or take sleeping pills. ?Get help right away if you have trouble breathing, or if you suddenly become confused. ?This information is not intended to replace advice given to you by your health care provider. Make sure you discuss any questions you have with your health care provider. ?Document Revised: 06/17/2020 Document Reviewed: 09/04/2019 ?Elsevier Patient Education ? 2022 Elsevier Inc. ? ?

## 2021-12-20 ENCOUNTER — Encounter: Payer: Self-pay | Admitting: Internal Medicine

## 2021-12-20 ENCOUNTER — Encounter: Payer: Self-pay | Admitting: Physician Assistant

## 2021-12-20 ENCOUNTER — Other Ambulatory Visit: Payer: Self-pay

## 2021-12-20 ENCOUNTER — Encounter (HOSPITAL_COMMUNITY)
Admission: RE | Admit: 2021-12-20 | Discharge: 2021-12-20 | Disposition: A | Discharge status: Home / Self Care | Payer: Self-pay | Source: Ambulatory Visit | Attending: Internal Medicine | Admitting: Internal Medicine

## 2021-12-20 ENCOUNTER — Telehealth: Payer: Self-pay | Admitting: Medical Oncology

## 2021-12-20 ENCOUNTER — Other Ambulatory Visit (HOSPITAL_COMMUNITY): Payer: Self-pay

## 2021-12-20 NOTE — Telephone Encounter (Signed)
Drug replacement form completed and returned to [facility] ?

## 2021-12-22 ENCOUNTER — Encounter (HOSPITAL_COMMUNITY): Payer: Self-pay

## 2021-12-22 ENCOUNTER — Ambulatory Visit (HOSPITAL_COMMUNITY): Payer: Self-pay | Admitting: Anesthesiology

## 2021-12-22 ENCOUNTER — Ambulatory Visit (HOSPITAL_BASED_OUTPATIENT_CLINIC_OR_DEPARTMENT_OTHER): Payer: Self-pay | Admitting: Anesthesiology

## 2021-12-22 ENCOUNTER — Encounter (HOSPITAL_COMMUNITY)
Admission: RE | Disposition: A | Discharge status: Home / Self Care | Payer: Self-pay | Source: Home / Self Care | Attending: Internal Medicine

## 2021-12-22 ENCOUNTER — Telehealth: Payer: Self-pay | Admitting: Internal Medicine

## 2021-12-22 ENCOUNTER — Ambulatory Visit (HOSPITAL_COMMUNITY)
Admission: RE | Admit: 2021-12-22 | Discharge: 2021-12-22 | Disposition: A | Discharge status: Home / Self Care | Payer: Self-pay | Attending: Internal Medicine | Admitting: Internal Medicine

## 2021-12-22 DIAGNOSIS — Z87891 Personal history of nicotine dependence: Secondary | ICD-10-CM | POA: Insufficient documentation

## 2021-12-22 DIAGNOSIS — I10 Essential (primary) hypertension: Secondary | ICD-10-CM | POA: Insufficient documentation

## 2021-12-22 DIAGNOSIS — K30 Functional dyspepsia: Secondary | ICD-10-CM | POA: Insufficient documentation

## 2021-12-22 DIAGNOSIS — T18128A Food in esophagus causing other injury, initial encounter: Secondary | ICD-10-CM

## 2021-12-22 DIAGNOSIS — Z7984 Long term (current) use of oral hypoglycemic drugs: Secondary | ICD-10-CM | POA: Insufficient documentation

## 2021-12-22 DIAGNOSIS — G4730 Sleep apnea, unspecified: Secondary | ICD-10-CM | POA: Insufficient documentation

## 2021-12-22 DIAGNOSIS — I2510 Atherosclerotic heart disease of native coronary artery without angina pectoris: Secondary | ICD-10-CM | POA: Insufficient documentation

## 2021-12-22 DIAGNOSIS — Z980 Intestinal bypass and anastomosis status: Secondary | ICD-10-CM | POA: Insufficient documentation

## 2021-12-22 DIAGNOSIS — K2970 Gastritis, unspecified, without bleeding: Secondary | ICD-10-CM

## 2021-12-22 DIAGNOSIS — K2270 Barrett's esophagus without dysplasia: Secondary | ICD-10-CM

## 2021-12-22 DIAGNOSIS — Z79899 Other long term (current) drug therapy: Secondary | ICD-10-CM | POA: Insufficient documentation

## 2021-12-22 DIAGNOSIS — E119 Type 2 diabetes mellitus without complications: Secondary | ICD-10-CM | POA: Insufficient documentation

## 2021-12-22 DIAGNOSIS — Z9884 Bariatric surgery status: Secondary | ICD-10-CM | POA: Insufficient documentation

## 2021-12-22 DIAGNOSIS — K219 Gastro-esophageal reflux disease without esophagitis: Secondary | ICD-10-CM | POA: Insufficient documentation

## 2021-12-22 DIAGNOSIS — E785 Hyperlipidemia, unspecified: Secondary | ICD-10-CM | POA: Insufficient documentation

## 2021-12-22 HISTORY — PX: ESOPHAGOGASTRODUODENOSCOPY (EGD) WITH PROPOFOL: SHX5813

## 2021-12-22 LAB — GLUCOSE, CAPILLARY: Glucose-Capillary: 108 mg/dL — ABNORMAL HIGH (ref 70–99)

## 2021-12-22 SURGERY — ESOPHAGOGASTRODUODENOSCOPY (EGD) WITH PROPOFOL
Anesthesia: General

## 2021-12-22 MED ORDER — LIDOCAINE HCL (CARDIAC) PF 100 MG/5ML IV SOSY
PREFILLED_SYRINGE | INTRAVENOUS | Status: DC | PRN
  Administered 2021-12-22: 50 mg via INTRAVENOUS

## 2021-12-22 MED ORDER — LACTATED RINGERS IV SOLN: INTRAVENOUS | Status: DC

## 2021-12-22 MED ORDER — PROPOFOL 10 MG/ML IV BOLUS
INTRAVENOUS | Status: DC | PRN
  Administered 2021-12-22: 100 mg via INTRAVENOUS
  Administered 2021-12-22: 50 mg via INTRAVENOUS

## 2021-12-22 NOTE — Anesthesia Preprocedure Evaluation (Addendum)
Anesthesia Evaluation  ?Patient identified by MRN, date of birth, ID band ?Patient awake ? ? ? ?Reviewed: ?Allergy & Precautions, H&P , NPO status , Patient's Chart, lab work & pertinent test results, reviewed documented beta blocker date and time  ? ?Airway ?Mallampati: II ? ?TM Distance: >3 FB ?Neck ROM: full ? ? ? Dental ? ?(+) Dental Advisory Given, Missing, Loose, Poor Dentition, Chipped ?  ?Pulmonary ?neg pulmonary ROS, former smoker,  ?  ?Pulmonary exam normal ?breath sounds clear to auscultation ? ? ? ? ? ? Cardiovascular ?Exercise Tolerance: Good ?hypertension, + CAD  ? ?Rhythm:regular Rate:Normal ? ? ?  ?Neuro/Psych ? Neuromuscular disease negative psych ROS  ? GI/Hepatic ?Neg liver ROS, GERD  Medicated,  ?Endo/Other  ?negative endocrine ROSdiabetes, Type 2 ? Renal/GU ?negative Renal ROS  ?negative genitourinary ?  ?Musculoskeletal ? ? Abdominal ?  ?Peds ? Hematology ? ?(+) Blood dyscrasia, anemia ,   ?Anesthesia Other Findings ? ? Reproductive/Obstetrics ?negative OB ROS ? ?  ? ? ? ? ? ? ? ? ? ? ? ? ? ?  ?  ? ? ? ? ? ? ? ?Anesthesia Physical ?Anesthesia Plan ? ?ASA: 3 ? ?Anesthesia Plan: General  ? ?Post-op Pain Management:   ? ?Induction:  ? ?PONV Risk Score and Plan:  ? ?Airway Management Planned:  ? ?Additional Equipment:  ? ?Intra-op Plan:  ? ?Post-operative Plan:  ? ?Informed Consent: I have reviewed the patients History and Physical, chart, labs and discussed the procedure including the risks, benefits and alternatives for the proposed anesthesia with the patient or authorized representative who has indicated his/her understanding and acceptance.  ? ? ? ?Dental Advisory Given ? ?Plan Discussed with: CRNA ? ?Anesthesia Plan Comments:   ? ? ? ? ? ? ?Anesthesia Quick Evaluation ? ?

## 2021-12-22 NOTE — Transfer of Care (Signed)
Immediate Anesthesia Transfer of Care Note ? ?Patient: Jeremy [name] ? ?Procedure(s) Performed: ESOPHAGOGASTRODUODENOSCOPY (EGD) WITH PROPOFOL ? ?Patient Location: Short Stay ? ?Anesthesia Type:General ? ?Level of Consciousness: drowsy ? ?Airway & Oxygen Therapy: Patient Spontanous Breathing ? ?Post-op Assessment: Report given to RN and Post -op Vital signs reviewed and stable ? ?Post vital signs: Reviewed and stable ? ?Last Vitals:  ?Vitals Value Taken Time  ?BP    ?Temp    ?Pulse    ?Resp    ?SpO2    ? ? ?Last Pain:  ?Vitals:  ? 12/22/21 0953  ?TempSrc:   ?PainSc: 0-No pain  ?   ? ?  ? ?Complications: No notable events documented. ?

## 2021-12-22 NOTE — Anesthesia Postprocedure Evaluation (Signed)
Anesthesia Post Note ? ?Patient: Jeremy [name] ? ?Procedure(s) Performed: ESOPHAGOGASTRODUODENOSCOPY (EGD) WITH PROPOFOL ? ?Patient location during evaluation: Phase II ?Anesthesia Type: General ?Level of consciousness: awake ?Pain management: pain level controlled ?Vital Signs Assessment: post-procedure vital signs reviewed and stable ?Respiratory status: spontaneous breathing and respiratory function stable ?Cardiovascular status: blood pressure returned to baseline and stable ?Postop Assessment: no headache and no apparent nausea or vomiting ?Anesthetic complications: no ?Comments: Late entry ? ? ?No notable events documented. ? ? ?Last Vitals:  ?Vitals:  ? 12/22/21 0907 12/22/21 1017  ?BP:  (!) 97/45  ?Pulse: 74 77  ?Resp: (!) 22 18  ?Temp: 36.7 ?C 36.7 ?C  ?SpO2: 99% 98%  ?  ?Last Pain:  ?Vitals:  ? 12/22/21 1017  ?TempSrc: Oral  ?PainSc: 0-No pain  ? ? ?  ?  ?  ?  ?  ?  ? ?Windell Norfolk ? ? ? ? ?

## 2021-12-22 NOTE — Anesthesia Procedure Notes (Addendum)
Date/Time: 12/22/2021 9:56 AM ?Performed by: Julian Reil, CRNA ?Pre-anesthesia Checklist: Patient identified, Emergency Drugs available, Suction available and Patient being monitored ?Patient Re-evaluated:Patient Re-evaluated prior to induction ?Oxygen Delivery Method: Nasal cannula ?Induction Type: IV induction ?Placement Confirmation: positive ETCO2 ? ? ? ? ?

## 2021-12-22 NOTE — Telephone Encounter (Signed)
Tried to call pt, LMOVM for return call. 

## 2021-12-22 NOTE — Telephone Encounter (Signed)
Patient presented for EGD today.  Unfortunately had a large amount of food in his stomach can we reschedule him for repeat EGD?  He will need to be on clear liquids entire day prior.  Thank you ?

## 2021-12-22 NOTE — Interval H&P Note (Signed)
History and Physical Interval Note: ? ?12/22/2021 ?9:48 AM ? ?Jeremy [name]  has presented today for surgery, with the diagnosis of ida, HX BARRETTS.  The various methods of treatment have been discussed with the patient and family. After consideration of risks, benefits and other options for treatment, the patient has consented to  Procedure(s) with comments: ?ESOPHAGOGASTRODUODENOSCOPY (EGD) WITH PROPOFOL (N/A) - 10:45am, [name] 3 as a surgical intervention.  The patient's history has been reviewed, patient examined, no change in status, stable for surgery.  I have reviewed the patient's chart and labs.  Questions were answered to the patient's satisfaction.   ? ? ?Lanelle Bal ? ? ?

## 2021-12-22 NOTE — Telephone Encounter (Signed)
Spoke to pt, he would like morning procedure. Advised him we will call back to schedule EGD when April schedule is available. ?

## 2021-12-22 NOTE — Op Note (Signed)
[facility] ?Patient Name: Jeremy [name] ?Procedure Date: 12/22/2021 9:44 AM ?MRN: [id number] ?Date of Birth: May 30, 1961 ?Attending MD: Elon Alas. Abbey Chatters , DO ?CSN: [id number] ?Age: 61 ?Admit Type: Outpatient ?Procedure:                Upper GI endoscopy ?Indications:              Epigastric abdominal pain, Functional Dyspepsia,  ?                          Heartburn, Follow-up of Barrett's esophagus ?Providers:                Elon Alas. Abbey Chatters, DO, Hughie Closs RN, RN, Tammy  ?                          Vaught, RN, Minette Headland L. Risa Grill, Technician ?Referring MD:              ?Medicines:                See the Anesthesia note for documentation of the  ?                          administered medications ?Complications:            No immediate complications. ?Estimated Blood Loss:     Estimated blood loss was minimal. ?Procedure:                Pre-Anesthesia Assessment: ?                          - The anesthesia plan was to use monitored  ?                          anesthesia care (MAC). ?                          After obtaining informed consent, the endoscope was  ?                          passed under direct vision. Throughout the  ?                          procedure, the patient's blood pressure, pulse, and  ?                          oxygen saturations were monitored continuously. The  ?                          GIF-H190 ([id number]) scope was introduced through the  ?                          mouth, and advanced to the efferent jejunal loop.  ?                          The upper GI endoscopy was accomplished without  ?  difficulty. The patient tolerated the procedure  ?                          well. ?Scope In: 9:57:47 AM ?Scope Out: 9:59:15 AM ?Total Procedure Duration: 0 hours 1 minute 28 seconds  ?Findings: ?     Evidence of a Roux-en-Y gastrojejunostomy was found. This was traversed.  ?     The duodenum-to-jejunum limb was not examined as it could not be reached. ?     Diffuse  moderate inflammation characterized by erosions and erythema was  ?     found in the entire examined stomach. ?     A large amount of food (residue) was found in the entire examined  ?     stomach. ?Impression:               - Roux-en-Y gastrojejunostomy. ?                          - Gastritis. ?                          - A large amount of food (residue) in the stomach. ?                          - No specimens collected. ?Moderate Sedation: ?     Per Anesthesia Care ?Recommendation:           - Patient has a contact number available for  ?                          emergencies. The signs and symptoms of potential  ?                          delayed complications were discussed with the  ?                          patient. Return to normal activities tomorrow.  ?                          Written discharge instructions were provided to the  ?                          patient. ?                          - Resume previous diet. ?                          - Continue present medications. ?                          - Use a proton pump inhibitor PO BID. ?                          - Repeat upper endoscopy at the next available  ?                          appointment. Incomplete evaluation due to  large  ?                          amount of food. Will need to be on clear liquids  ?                          day prior. ?Procedure Code(s):        --- Professional --- ?                          [phone], Esophagogastroduodenoscopy, flexible,  ?                          transoral; diagnostic, including collection of  ?                          specimen(s) by brushing or washing, when performed  ?                          (separate procedure) ?Diagnosis Code(s):        --- Professional --- ?                          Z98.0, Intestinal bypass and anastomosis status ?                          K29.70, Gastritis, unspecified, without bleeding ?                          K22.70, Barrett's esophagus without dysplasia ?                           R10.13, Epigastric pain ?                          K30, Functional dyspepsia ?                          R12, Heartburn ?CPT copyright 2019 American Medical Association. All rights reserved. ?The codes documented in this report are preliminary and upon coder review may  ?be revised to meet current compliance requirements. ?Elon Alas. Abbey Chatters, DO ?Elon Alas. [place], DO ?12/22/2021 10:04:15 AM ?This report has been signed electronically. ?Number of Addenda: 0 ?

## 2021-12-22 NOTE — Discharge Instructions (Signed)
EGD ?Discharge instructions ?Please read the instructions outlined below and refer to this sheet in the next few weeks. These discharge instructions provide you with general information on caring for yourself after you leave the hospital. Your doctor may also give you specific instructions. While your treatment has been planned according to the most current medical practices available, unavoidable complications occasionally occur. If you have any problems or questions after discharge, please call your doctor. ?ACTIVITY ?You may resume your regular activity but move at a slower pace for the next 24 hours.  ?Take frequent rest periods for the next 24 hours.  ?Walking will help expel (get rid of) the air and reduce the bloated feeling in your abdomen.  ?No driving for 24 hours (because of the anesthesia (medicine) used during the test).  ?You may shower.  ?Do not sign any important legal documents or operate any machinery for 24 hours (because of the anesthesia used during the test).  ?NUTRITION ?Drink plenty of fluids.  ?You may resume your normal diet.  ?Begin with a light meal and progress to your normal diet.  ?Avoid alcoholic beverages for 24 hours or as instructed by your caregiver.  ?MEDICATIONS ?You may resume your normal medications unless your caregiver tells you otherwise.  ?WHAT YOU CAN EXPECT TODAY ?You may experience abdominal discomfort such as a feeling of fullness or ?gas? pains.  ?FOLLOW-UP ?Your doctor will discuss the results of your test with you.  ?SEEK IMMEDIATE MEDICAL ATTENTION IF ANY OF THE FOLLOWING OCCUR: ?Excessive nausea (feeling sick to your stomach) and/or vomiting.  ?Severe abdominal pain and distention (swelling).  ?Trouble swallowing.  ?Temperature over 101? F (37.8? C).  ?Rectal bleeding or vomiting of blood.  ? ? ?You had a significant amount of food in your stomach and small bowel which prevented full evaluation of your upper GI tract.  Also increased risk of aspiration, so  procedure was aborted.  I was able to see significant gastritis however.  Would recommend you take twice daily PPI.  Will need repeat endoscopy at your earliest convenience.  You will need to be on clear liquids day before procedure. ? ?I hope you have a great rest of your week! ? ?Hennie Duos. Marletta Lor, D.O. ?Gastroenterology and Hepatology ?[facility] ? ?

## 2021-12-27 ENCOUNTER — Encounter (HOSPITAL_COMMUNITY): Payer: Self-pay | Admitting: Internal Medicine

## 2021-12-29 NOTE — Telephone Encounter (Signed)
Called pt, EGD scheduled for 01/30/22 at 11:00am. He's aware to do clear liquids the entire day before. Pt is planning to do clear liquids for 2 days before. Orders entered. ?

## 2021-12-29 NOTE — Telephone Encounter (Signed)
Pre-op appt 01/26/22. Appt letter mailed with procedure instructions. ?

## 2022-01-02 ENCOUNTER — Other Ambulatory Visit: Payer: Self-pay

## 2022-01-02 ENCOUNTER — Encounter: Payer: Self-pay | Admitting: Internal Medicine

## 2022-01-02 ENCOUNTER — Ambulatory Visit: Payer: Self-pay | Attending: Internal Medicine | Admitting: Internal Medicine

## 2022-01-02 VITALS — BP 133/68 | HR 74 | Resp 16 | Wt 227.8 lb

## 2022-01-02 DIAGNOSIS — I10 Essential (primary) hypertension: Secondary | ICD-10-CM

## 2022-01-02 DIAGNOSIS — D509 Iron deficiency anemia, unspecified: Secondary | ICD-10-CM

## 2022-01-02 DIAGNOSIS — Z8719 Personal history of other diseases of the digestive system: Secondary | ICD-10-CM

## 2022-01-02 DIAGNOSIS — E871 Hypo-osmolality and hyponatremia: Secondary | ICD-10-CM

## 2022-01-02 DIAGNOSIS — R140 Abdominal distension (gaseous): Secondary | ICD-10-CM

## 2022-01-02 DIAGNOSIS — E1169 Type 2 diabetes mellitus with other specified complication: Secondary | ICD-10-CM

## 2022-01-02 DIAGNOSIS — N6321 Unspecified lump in the left breast, upper outer quadrant: Secondary | ICD-10-CM

## 2022-01-02 DIAGNOSIS — E669 Obesity, unspecified: Secondary | ICD-10-CM

## 2022-01-02 LAB — POCT GLYCOSYLATED HEMOGLOBIN (HGB A1C): HbA1c, POC (controlled diabetic range): 5.7 % (ref 0.0–7.0)

## 2022-01-02 MED ORDER — TAMSULOSIN HCL 0.4 MG PO CAPS
0.4000 mg | ORAL_CAPSULE | Freq: Every day | ORAL | 3 refills | Status: DC
  Filled 2022-01-02 – 2022-02-05 (×2): qty 90, 90d supply, fill #0
  Filled 2022-05-01: qty 30, 30d supply, fill #1
  Filled 2022-05-08: qty 90, 90d supply, fill #1

## 2022-01-02 MED ORDER — LISINOPRIL 40 MG PO TABS
20.0000 mg | ORAL_TABLET | Freq: Two times a day (BID) | ORAL | 3 refills | Status: DC
  Filled 2022-01-02 – 2022-01-19 (×2): qty 90, 90d supply, fill #0
  Filled 2022-01-27: qty 30, 30d supply, fill #0
  Filled 2022-03-11: qty 30, 30d supply, fill #1

## 2022-01-02 MED ORDER — FERROUS SULFATE 325 (65 FE) MG PO TABS
325.0000 mg | ORAL_TABLET | Freq: Two times a day (BID) | ORAL | 2 refills | Status: DC
  Filled 2022-01-02 – 2022-01-19 (×2): qty 180, 90d supply, fill #0
  Filled 2022-01-27: qty 60, 30d supply, fill #0
  Filled 2022-02-26 – 2022-03-07 (×2): qty 60, 30d supply, fill #1
  Filled 2022-05-01: qty 60, 30d supply, fill #2
  Filled 2022-06-11 – 2022-06-19 (×2): qty 60, 30d supply, fill #3
  Filled 2022-07-16: qty 60, 30d supply, fill #4
  Filled 2022-08-20 – 2022-08-30 (×2): qty 60, 30d supply, fill #5
  Filled 2022-09-25 – 2022-10-12 (×2): qty 60, 30d supply, fill #6
  Filled 2022-11-05 – 2022-11-06 (×3): qty 60, 30d supply, fill #7
  Filled 2022-12-11: qty 60, 30d supply, fill #8

## 2022-01-02 NOTE — Progress Notes (Addendum)
? ? ?Patient ID: Jeremy [name], male    DOB: 1961/10/02  MRN: [id number] ? ?CC: Diabetes and Hypertension ? ? ?Subjective: ?Jeremy [name] is a 61 y.o. male who presents for chronic ds management ?His concerns today include:  ?Pt with hx of DM, HL, HTN,GERD, IDA (Barrett's esophagus and moderate gastritis thought to be NSAID induced on EGD; iron infusions by Dr. 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  Myoview neg 02/2021 ? ?DM: ?Results for orders placed or performed in visit on 01/02/22  ?POCT glycosylated hemoglobin (Hb A1C)  ?Result Value Ref Range  ? Hemoglobin A1C    ? HbA1c POC (<> result, manual entry)    ? HbA1c, POC (prediabetic range)    ? HbA1c, POC (controlled diabetic range) 5.7 0.0 - 7.0 %  ? ? ? On glipizide.  He has had 1-2 low BS episodes since last visit with me.  He can tell when the blood sugar is low.  He has gained 14 pounds since June of last year.  Drinks sweet tea and regular diet Dr. Malachi Bonds.  Reports decreased appetite.  Some nights he eats dinner when he gets home after 10 PM. ? ?HTN: Taking lisinopril 20 mg twice a day.  Not checking blood pressure recently.  He has not been limiting salt in the foods as he should.  Thought that he should use a little bit more salt given he has issue of low salt and is on sodium tablet.  Reports some edema in the lower legs.  He has decreased the sodium tablet to 1 gram half a tab twice a day.  Saw Dr. Loanne Drilling recently. ? ?History of Barrett's esophagus: Recently had EGD that was not a good study.  Patient had food in the stomach.  Plan is to repeat the EGD within the next month. ?-Patient was taking Protonix about 2 times a week.  Recently increased it to daily.  Since then he has had increased gas, bloating and sometimes incontinence of feces.  ? ?Iron deficiency anemia.  Saw the oncologist Dr. Julien Nordmann recently.   Since last visit with me he reports having had 3 iron infusions.  Last H&H increased from 11.2/35 to 12.8/37.9.  Iron saturation improved from 13-28.  On iron supplement and taking twice daily. ? ?Reports feeling a lump in the LT breast since January of this yr  He was taking One-A-Day women's vitamin tablets.  Told by his oncologist to switch to regular multivitamins from a.m. ?Patient Active Problem List  ? Diagnosis Date Noted  ? Hyponatremia 06/23/2021  ? Low serum vitamin B12 03/23/2021  ? Vitamin D deficiency 03/23/2021  ? S/P bypass gastrojejunostomy 01/03/2021  ? Gastric outlet obstruction   ? Duodenal stricture   ? Primary osteoarthritis of both hips 11/15/2020  ? Aortic atherosclerosis ([place]) 08/30/2020  ? Coronary artery calcification 08/30/2020  ? Bilateral carpal tunnel syndrome 06/07/2020  ? Grief reaction 06/07/2020  ? Pyloric stenosis in adult 02/05/2020  ? Congenital hypertrophic pyloric stenosis   ? Duodenal web   ? Barrett's esophagus without dysplasia 05/19/2019  ? OSA (obstructive sleep apnea) 04/30/2019  ? Iron deficiency anemia 11/25/2018  ? Phimosis of penis 11/19/2018  ? Esophageal dysphagia 08/19/2018  ? Benign prostatic hyperplasia with post-void dribbling 01/31/2018  ? Absence of bladder continence 01/31/2018  ? Obesity (BMI 30-39.9) 11/05/2017  ? GERD (gastroesophageal reflux disease)  06/03/2017  ? Chronic pain of both knees 06/03/2017  ? Chronic lower back pain 06/03/2017  ? Type 2 diabetes mellitus without complication, without long-term current use of insulin ([place]) 04/12/2016  ? Essential hypertension 04/12/2016  ?  ? ?Current Outpatient Medications on File Prior to Visit  ?Medication Sig Dispense Refill  ? acetaminophen (TYLENOL) 500 MG tablet Take 1,000 mg by mouth in the morning and at bedtime.     ? Blood Glucose Monitoring Suppl (TRUE METRIX METER) w/Device KIT 1 each by Does not apply route 4 (four) times daily -  before meals and at bedtime. 1 kit 0  ? cyclobenzaprine (FLEXERIL)  10 MG tablet Take 1 tablet (10 mg total) by mouth 2 (two) times daily as needed for muscle spasms. (Patient taking differently: Take 10 mg by mouth in the morning and at bedtime.) 60 tablet 3  ? fluticasone (FLONASE) 50 MCG/ACT nasal spray Place 1 spray into both nostrils daily as needed for allergies or rhinitis. (Patient taking differently: Place 1 spray into both nostrils at bedtime.) 16 g 0  ? glipiZIDE (GLUCOTROL) 5 MG tablet TAKE 1 TABLET BY MOUTH IN THE MORNINGS AND 1 TABLET IN THE EVENINGS. 60 tablet 2  ? glucose blood test strip Use as instructed 100 each 12  ? loratadine (CLARITIN) 10 MG tablet Take 10 mg by mouth daily.    ? naproxen sodium (ALEVE) 220 MG tablet Take 440 mg by mouth in the morning and at bedtime.    ? ondansetron (ZOFRAN) 8 MG tablet Take 1 tablet (8 mg total) by mouth daily as needed for nausea or vomiting. 30 tablet 0  ? pantoprazole (PROTONIX) 40 MG tablet TAKE 1 TABLET (40 MG TOTAL) BY MOUTH 2 (TWO) TIMES DAILY BEFORE A MEAL. (Patient taking differently: Take 40 mg by mouth 2 (two) times a week.) 60 tablet 3  ? Potassium 99 MG TABS Take 99 mg by mouth 2 (two) times daily.    ? psyllium (METAMUCIL) 58.6 % powder Take 1 packet by mouth daily at 12 noon.    ? rOPINIRole (REQUIP) 0.5 MG tablet TAKE 1 TABLET (0.5 MG TOTAL) BY MOUTH AT BEDTIME. 90 tablet 1  ? sildenafil (VIAGRA) 50 MG tablet TAKE 1 TABLET 1/2 HR PRIOR TO SEX. LIMIT USE TO 1 TAB/24 HRS 10 tablet 3  ? sodium chloride 1 g tablet Take 2 tablets (2 g total) by mouth daily. (Patient taking differently: Take 1 g by mouth at bedtime.) 180 tablet 3  ? traMADol (ULTRAM) 50 MG tablet Take 1 tablet (50 mg total) by mouth every 12 (twelve) hours as needed (Patient taking differently: Take 50 mg by mouth 2 (two) times daily.) 60 tablet 2  ? vitamin B-12 (CYANOCOBALAMIN) 1000 MCG tablet Take 1,000 mcg by mouth daily.    ? ?No current facility-administered medications on file prior to visit.  ? ? ?Allergies  ?Allergen Reactions  ? Cymbalta  [Duloxetine Hcl] Other (See Comments)  ?  Caused anorgasmia  ? Doxycycline   ?  Unknown  ? Other Nausea And Vomiting  ?  Mayonnaise mustard ketchup  ? Robaxin [Methocarbamol] Other (See Comments)  ?  Had hiccups x 4 hours after taking  ? ? ?Social History  ? ?Socioeconomic History  ? Marital status: Significant Other  ?  Spouse name: Not on file  ? Number of children: Not on file  ? Years of education: Not on file  ? Highest education level: Not on file  ?Occupational History  ?  Occupation: Clinical biochemist  ?Tobacco Use  ? Smoking status: Former  ?  Types: Cigarettes  ?  Quit date: 10/16/1988  ?  Years since quitting: 33.2  ? Smokeless tobacco: Never  ?Vaping Use  ? Vaping Use: Never used  ?Substance and Sexual Activity  ? Alcohol use: No  ? Drug use: Yes  ?  Comment: rare  ? Sexual activity: Not on file  ?Other Topics Concern  ? Not on file  ?Social History Narrative  ? Volunteers as Airline pilot  ? ?Social Determinants of Health  ? ?Financial Resource Strain: Not on file  ?Food Insecurity: Not on file  ?Transportation Needs: Not on file  ?Physical Activity: Not on file  ?Stress: Not on file  ?Social Connections: Not on file  ?Intimate Partner Violence: Not on file  ? ? ?Family History  ?Problem Relation Age of Onset  ? Pancreatic cancer Mother   ? CAD Father   ? Colon cancer Paternal Grandfather   ? ? ?Past Surgical History:  ?Procedure Laterality Date  ? [place]  ? lumbar  ? BIOPSY  02/07/2019  ? Procedure: BIOPSY;  Surgeon: Danie Binder, MD;  Location: [facility];  Service: Endoscopy;;  ? [place]  ? COLONOSCOPY N/A 02/07/2019  ? Dr. Oneida Alar: External hemorrhoids next colonoscopy in 10 years  ? ESOPHAGOGASTRODUODENOSCOPY N/A 02/07/2019  ? Dr. Oneida Alar: Barrett's esophagus without dysplasia chronic inactive gastritis but no H. 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  ESOPHAGOGASTRODUODENOSCOPY N/A 01/05/2020  ? Procedure: ESOPHAGOGASTRODUODENOSCOPY (EGD);  Surgeon: Danie Binder, MD;  Location: [facility];  Service: Endoscopy;  Laterality: N/A;  10:30am  ? ESOPHAGOGASTRODUODENOSCO

## 2022-01-03 ENCOUNTER — Other Ambulatory Visit: Payer: Self-pay

## 2022-01-04 ENCOUNTER — Other Ambulatory Visit (HOSPITAL_COMMUNITY): Payer: Self-pay

## 2022-01-04 ENCOUNTER — Other Ambulatory Visit: Payer: Self-pay | Admitting: Internal Medicine

## 2022-01-04 ENCOUNTER — Other Ambulatory Visit: Payer: Self-pay

## 2022-01-04 ENCOUNTER — Encounter: Payer: Self-pay | Admitting: Physician Assistant

## 2022-01-04 ENCOUNTER — Encounter: Payer: Self-pay | Admitting: Internal Medicine

## 2022-01-04 DIAGNOSIS — N6321 Unspecified lump in the left breast, upper outer quadrant: Secondary | ICD-10-CM

## 2022-01-04 NOTE — Addendum Note (Signed)
Addended by: Narda Rutherford on: 01/04/2022 03:43 PM ? ? Modules accepted: Orders ? ?

## 2022-01-06 ENCOUNTER — Other Ambulatory Visit (HOSPITAL_COMMUNITY): Payer: Self-pay

## 2022-01-09 ENCOUNTER — Other Ambulatory Visit: Payer: Self-pay

## 2022-01-09 ENCOUNTER — Other Ambulatory Visit (HOSPITAL_COMMUNITY): Payer: Self-pay

## 2022-01-09 ENCOUNTER — Encounter: Payer: Self-pay | Admitting: Physician Assistant

## 2022-01-09 ENCOUNTER — Encounter: Payer: Self-pay | Admitting: Internal Medicine

## 2022-01-10 ENCOUNTER — Other Ambulatory Visit: Payer: Self-pay

## 2022-01-13 ENCOUNTER — Other Ambulatory Visit: Payer: Self-pay

## 2022-01-14 ENCOUNTER — Encounter: Payer: Self-pay | Admitting: Internal Medicine

## 2022-01-17 ENCOUNTER — Encounter: Payer: Self-pay | Admitting: Internal Medicine

## 2022-01-19 ENCOUNTER — Other Ambulatory Visit: Payer: Self-pay | Admitting: Internal Medicine

## 2022-01-19 DIAGNOSIS — E119 Type 2 diabetes mellitus without complications: Secondary | ICD-10-CM

## 2022-01-20 ENCOUNTER — Ambulatory Visit
Admission: RE | Admit: 2022-01-20 | Discharge: 2022-01-20 | Disposition: A | Discharge status: Home / Self Care | Payer: Self-pay | Source: Ambulatory Visit | Attending: Obstetrics and Gynecology | Admitting: Obstetrics and Gynecology

## 2022-01-20 ENCOUNTER — Other Ambulatory Visit: Payer: Self-pay

## 2022-01-20 ENCOUNTER — Encounter: Payer: Self-pay | Admitting: Physician Assistant

## 2022-01-20 ENCOUNTER — Ambulatory Visit
Admission: RE | Admit: 2022-01-20 | Discharge: 2022-01-20 | Disposition: A | Discharge status: Home / Self Care | Payer: Self-pay | Source: Ambulatory Visit | Attending: Internal Medicine | Admitting: Internal Medicine

## 2022-01-20 ENCOUNTER — Encounter: Payer: Self-pay | Admitting: Internal Medicine

## 2022-01-20 DIAGNOSIS — N6321 Unspecified lump in the left breast, upper outer quadrant: Secondary | ICD-10-CM | POA: Insufficient documentation

## 2022-01-20 MED ORDER — GLIPIZIDE 5 MG PO TABS
ORAL_TABLET | ORAL | 2 refills | Status: DC
  Filled 2022-01-20 – 2022-01-27 (×2): qty 60, 30d supply, fill #0
  Filled 2022-03-11: qty 60, 30d supply, fill #1
  Filled 2022-04-14: qty 60, 30d supply, fill #2

## 2022-01-20 NOTE — Telephone Encounter (Signed)
Requested Prescriptions  ?Pending Prescriptions Disp Refills  ?? glipiZIDE (GLUCOTROL) 5 MG tablet 60 tablet 2  ?  Sig: TAKE 1 TABLET BY MOUTH IN THE MORNINGS AND 1 TABLET IN THE EVENINGS.  ?  ? Endocrinology:  Diabetes - Sulfonylureas Passed - 01/19/2022 11:53 PM  ?  ?  Passed - HBA1C is between 0 and 7.9 and within 180 days  ?  HbA1c, POC (prediabetic range)  ?Date Value Ref Range Status  ?09/05/2019 6.3 5.7 - 6.4 % Corrected  ? ?HbA1c, POC (controlled diabetic range)  ?Date Value Ref Range Status  ?01/02/2022 5.7 0.0 - 7.0 % Final  ?   ?  ?  Passed - Cr in normal range and within 360 days  ?  Creat  ?Date Value Ref Range Status  ?10/17/2016 0.72 0.70 - 1.33 mg/dL Final  ?  Comment:  ?    ?For patients > or = 59 years of age: The upper reference limit for ?Creatinine is approximately 13% higher for people identified as ?African-American. ?  ?  ? ?Creatinine, Ser  ?Date Value Ref Range Status  ?11/25/2021 0.76 0.40 - 1.50 mg/dL Final  ? ?Creatinine, Urine  ?Date Value Ref Range Status  ?12/25/2016 190 20 - 370 mg/dL Final  ?   ?  ?  Passed - Valid encounter within last 6 months  ?  Recent Outpatient Visits   ?      ? 2 weeks ago Type 2 diabetes mellitus with obesity (HCC)  ? [facility] Jonah Blue B, MD  ? 4 months ago Type 2 diabetes mellitus with obesity (HCC)  ? [facility] Jonah Blue B, MD  ? 8 months ago Type 2 diabetes mellitus with obesity (HCC)  ? [facility] Marcine Matar, MD  ? 10 months ago Dizziness  ? [facility] Marcine Matar, MD  ? 1 year ago Preoperative evaluation to rule out surgical contraindication  ? [facility] Marcine Matar, MD  ?  ?  ?Future Appointments   ?        ? In 3 months Marcine Matar, MD [facility]  ?  ? ?  ?  ?  ? ? ?

## 2022-01-23 ENCOUNTER — Encounter: Payer: Self-pay | Admitting: Internal Medicine

## 2022-01-23 NOTE — Patient Instructions (Signed)
? ? ? ? ? ? Annye Asa ? 01/23/2022  ?  ? @PREFPERIOPPHARMACY @ ? ? Your procedure is scheduled on  01/30/2022. ? ? Report to 02/01/2022 at  0845 A.M. ? ? Call this number if you have problems the morning of surgery: ? [phone] ? ? Remember: ? Follow the diet and prep instructions given to you by the office. ?  ? Take these medicines the morning of surgery with A SIP OF WATER  ? ?flexeril(if needed), zofran(if needed), protonix, tramadol(if needed). ?  ? Do not wear jewelry, make-up or nail polish. ? Do not wear lotions, powders, or perfumes, or deodorant. ? Do not shave 48 hours prior to surgery.  Men may shave face and neck. ? Do not bring valuables to the hospital. ? [facility] is not responsible for any belongings or valuables. ? ?Contacts, dentures or bridgework may not be worn into surgery.  Leave your suitcase in the car.  After surgery it may be brought to your room. ? ?For patients admitted to the hospital, discharge time will be determined by your treatment team. ? ?Patients discharged the day of surgery will not be allowed to drive home and must have someone with them for 24 hours.  ? ? ?Special instructions:   DO NOT smoke tobacco or vape for 24 hours before your procedure. ? ?Please read over the following fact sheets that you were given. ?Anesthesia Post-op Instructions and Care and Recovery After Surgery ?  ? ? ? Upper Endoscopy, Adult, Care After ?This sheet gives you information about how to care for yourself after your procedure. Your health care provider may also give you more specific instructions. If you have problems or questions, contact your health care provider. ?What can I expect after the procedure? ?After the procedure, it is common to have: ?A sore throat. ?Mild stomach pain or discomfort. ?Bloating. ?Nausea. ?Follow these instructions at home: ? ?Follow instructions from your health care provider about what to eat or drink after your procedure. ?Return to your normal activities  as told by your health care provider. Ask your health care provider what activities are safe for you. ?Take over-the-counter and prescription medicines only as told by your health care provider. ?If you were given a sedative during the procedure, it can affect you for several hours. Do not drive or operate machinery until your health care provider says that it is safe. ?Keep all follow-up visits as told by your health care provider. This is important. ?Contact a health care provider if you have: ?A sore throat that lasts longer than one day. ?Trouble swallowing. ?Get help right away if: ?You vomit blood or your vomit looks like coffee grounds. ?You have: ?A fever. ?Bloody, black, or tarry stools. ?A severe sore throat or you cannot swallow. ?Difficulty breathing. ?Severe pain in your chest or abdomen. ?Summary ?After the procedure, it is common to have a sore throat, mild stomach discomfort, bloating, and nausea. ?If you were given a sedative during the procedure, it can affect you for several hours. Do not drive or operate machinery until your health care provider says that it is safe. ?Follow instructions from your health care provider about what to eat or drink after your procedure. ?Return to your normal activities as told by your health care provider. ?This information is not intended to replace advice given to you by your health care provider. Make sure you discuss any questions you have with your health care provider. ?Document Revised: 08/08/2019 Document  Reviewed: 03/04/2018 ?Elsevier Patient Education ? 2022 Elsevier Inc. ?Monitored Anesthesia Care, Care After ?This sheet gives you information about how to care for yourself after your procedure. Your health care provider may also give you more specific instructions. If you have problems or questions, contact your health care provider. ?What can I expect after the procedure? ?After the procedure, it is common to have: ?Tiredness. ?Forgetfulness about what  happened after the procedure. ?Impaired judgment for important decisions. ?Nausea or vomiting. ?Some difficulty with balance. ?Follow these instructions at home: ?For the time period you were told by your health care provider: ?  ?Rest as needed. ?Do not participate in activities where you could fall or become injured. ?Do not drive or use machinery. ?Do not drink alcohol. ?Do not take sleeping pills or medicines that cause drowsiness. ?Do not make important decisions or sign legal documents. ?Do not take care of children on your own. ?Eating and drinking ?Follow the diet that is recommended by your health care provider. ?Drink enough fluid to keep your urine pale yellow. ?If you vomit: ?Drink water, juice, or soup when you can drink without vomiting. ?Make sure you have little or no nausea before eating solid foods. ?General instructions ?Have a responsible adult stay with you for the time you are told. It is important to have someone help care for you until you are awake and alert. ?Take over-the-counter and prescription medicines only as told by your health care provider. ?If you have sleep apnea, surgery and certain medicines can increase your risk for breathing problems. Follow instructions from your health care provider about wearing your sleep device: ?Anytime you are sleeping, including during daytime naps. ?While taking prescription pain medicines, sleeping medicines, or medicines that make you drowsy. ?Avoid smoking. ?Keep all follow-up visits as told by your health care provider. This is important. ?Contact a health care provider if: ?You keep feeling nauseous or you keep vomiting. ?You feel light-headed. ?You are still sleepy or having trouble with balance after 24 hours. ?You develop a rash. ?You have a fever. ?You have redness or swelling around the IV site. ?Get help right away if: ?You have trouble breathing. ?You have new-onset confusion at home. ?Summary ?For several hours after your procedure, you  may feel tired. You may also be forgetful and have poor judgment. ?Have a responsible adult stay with you for the time you are told. It is important to have someone help care for you until you are awake and alert. ?Rest as told. Do not drive or operate machinery. Do not drink alcohol or take sleeping pills. ?Get help right away if you have trouble breathing, or if you suddenly become confused. ?This information is not intended to replace advice given to you by your health care provider. Make sure you discuss any questions you have with your health care provider. ?Document Revised: 06/17/2020 Document Reviewed: 09/04/2019 ?Elsevier Patient Education ? 2022 Elsevier Inc. ? ?

## 2022-01-26 ENCOUNTER — Encounter (HOSPITAL_COMMUNITY)
Admission: RE | Admit: 2022-01-26 | Discharge: 2022-01-26 | Disposition: A | Discharge status: Home / Self Care | Payer: Self-pay | Source: Ambulatory Visit | Attending: Internal Medicine | Admitting: Internal Medicine

## 2022-01-26 ENCOUNTER — Telehealth: Payer: Self-pay | Admitting: Internal Medicine

## 2022-01-26 NOTE — Telephone Encounter (Signed)
Spoke with patient. Reports chronic constipation, goes a few days between bowel movements.  Also has increased gassiness.  Was nauseated yesterday, but this has resolved today.  Reports he previously took Metamucil on a daily basis and this allowed him to have good productive bowel movements, but he stopped for some reason.  Recommended resuming Metamucil daily.  Okay to proceed with EGD as planned. ?

## 2022-01-26 NOTE — Telephone Encounter (Signed)
Tried calling patient. Lady answered stating patient was in the shower. Requested return call.  ?

## 2022-01-27 ENCOUNTER — Encounter: Payer: Self-pay | Admitting: Internal Medicine

## 2022-01-27 ENCOUNTER — Encounter: Payer: Self-pay | Admitting: Physician Assistant

## 2022-01-27 ENCOUNTER — Other Ambulatory Visit: Payer: Self-pay

## 2022-01-28 ENCOUNTER — Encounter: Payer: Self-pay | Admitting: Internal Medicine

## 2022-01-30 ENCOUNTER — Encounter (HOSPITAL_COMMUNITY)
Admission: RE | Disposition: A | Discharge status: Home / Self Care | Payer: Self-pay | Source: Home / Self Care | Attending: Internal Medicine

## 2022-01-30 ENCOUNTER — Encounter (HOSPITAL_COMMUNITY): Payer: Self-pay

## 2022-01-30 ENCOUNTER — Ambulatory Visit (HOSPITAL_COMMUNITY)
Admission: RE | Admit: 2022-01-30 | Discharge: 2022-01-30 | Disposition: A | Discharge status: Home / Self Care | Payer: Self-pay | Attending: Internal Medicine | Admitting: Internal Medicine

## 2022-01-30 ENCOUNTER — Ambulatory Visit (HOSPITAL_COMMUNITY): Payer: Self-pay | Admitting: Anesthesiology

## 2022-01-30 ENCOUNTER — Ambulatory Visit (HOSPITAL_BASED_OUTPATIENT_CLINIC_OR_DEPARTMENT_OTHER): Payer: Self-pay | Admitting: Anesthesiology

## 2022-01-30 DIAGNOSIS — K319 Disease of stomach and duodenum, unspecified: Secondary | ICD-10-CM | POA: Insufficient documentation

## 2022-01-30 DIAGNOSIS — K30 Functional dyspepsia: Secondary | ICD-10-CM | POA: Insufficient documentation

## 2022-01-30 DIAGNOSIS — K2270 Barrett's esophagus without dysplasia: Secondary | ICD-10-CM

## 2022-01-30 DIAGNOSIS — K219 Gastro-esophageal reflux disease without esophagitis: Secondary | ICD-10-CM | POA: Insufficient documentation

## 2022-01-30 DIAGNOSIS — Z87891 Personal history of nicotine dependence: Secondary | ICD-10-CM

## 2022-01-30 DIAGNOSIS — I10 Essential (primary) hypertension: Secondary | ICD-10-CM

## 2022-01-30 DIAGNOSIS — K2950 Unspecified chronic gastritis without bleeding: Secondary | ICD-10-CM | POA: Insufficient documentation

## 2022-01-30 DIAGNOSIS — E119 Type 2 diabetes mellitus without complications: Secondary | ICD-10-CM

## 2022-01-30 DIAGNOSIS — I2510 Atherosclerotic heart disease of native coronary artery without angina pectoris: Secondary | ICD-10-CM | POA: Insufficient documentation

## 2022-01-30 DIAGNOSIS — K2970 Gastritis, unspecified, without bleeding: Secondary | ICD-10-CM

## 2022-01-30 DIAGNOSIS — Z79899 Other long term (current) drug therapy: Secondary | ICD-10-CM | POA: Insufficient documentation

## 2022-01-30 DIAGNOSIS — Z9884 Bariatric surgery status: Secondary | ICD-10-CM | POA: Insufficient documentation

## 2022-01-30 DIAGNOSIS — Z794 Long term (current) use of insulin: Secondary | ICD-10-CM

## 2022-01-30 HISTORY — PX: BIOPSY: SHX5522

## 2022-01-30 HISTORY — PX: ESOPHAGOGASTRODUODENOSCOPY (EGD) WITH PROPOFOL: SHX5813

## 2022-01-30 LAB — GLUCOSE, CAPILLARY: Glucose-Capillary: 113 mg/dL — ABNORMAL HIGH (ref 70–99)

## 2022-01-30 SURGERY — ESOPHAGOGASTRODUODENOSCOPY (EGD) WITH PROPOFOL
Anesthesia: General

## 2022-01-30 MED ORDER — LIDOCAINE HCL (CARDIAC) PF 100 MG/5ML IV SOSY
PREFILLED_SYRINGE | INTRAVENOUS | Status: DC | PRN
  Administered 2022-01-30: 50 mg via INTRAVENOUS

## 2022-01-30 MED ORDER — PROPOFOL 10 MG/ML IV BOLUS
INTRAVENOUS | Status: DC | PRN
  Administered 2022-01-30: 50 mg via INTRAVENOUS
  Administered 2022-01-30: 100 mg via INTRAVENOUS
  Administered 2022-01-30: 40 mg via INTRAVENOUS
  Administered 2022-01-30 (×2): 50 mg via INTRAVENOUS

## 2022-01-30 MED ORDER — LACTATED RINGERS IV SOLN
INTRAVENOUS | Status: DC
  Administered 2022-01-30: 1000 mL via INTRAVENOUS

## 2022-01-30 NOTE — Anesthesia Procedure Notes (Signed)
Date/Time: 01/30/2022 11:06 AM ?Performed by: Julian Reil, CRNA ?Pre-anesthesia Checklist: Patient identified, Emergency Drugs available, Patient being monitored and Suction available ?Patient Re-evaluated:Patient Re-evaluated prior to induction ?Oxygen Delivery Method: Nasal cannula ?Induction Type: IV induction ?Placement Confirmation: positive ETCO2 ? ? ? ? ?

## 2022-01-30 NOTE — Anesthesia Preprocedure Evaluation (Signed)
Anesthesia Evaluation  ?Patient identified by MRN, date of birth, ID band ?Patient awake ? ? ? ?Reviewed: ?Allergy & Precautions, H&P , NPO status , Patient's Chart, lab work & pertinent test results, reviewed documented beta blocker date and time  ? ?Airway ?Mallampati: II ? ?TM Distance: >3 FB ?Neck ROM: full ? ? ? Dental ? ?(+) Dental Advisory Given, Missing, Loose, Poor Dentition, Chipped ?  ?Pulmonary ?neg pulmonary ROS, former smoker,  ?  ?Pulmonary exam normal ?breath sounds clear to auscultation ? ? ? ? ? ? Cardiovascular ?Exercise Tolerance: Good ?hypertension, + CAD  ? ?Rhythm:regular Rate:Normal ? ? ?  ?Neuro/Psych ? Neuromuscular disease negative psych ROS  ? GI/Hepatic ?Neg liver ROS, GERD  Medicated,  ?Endo/Other  ?negative endocrine ROSdiabetes, Type 2 ? Renal/GU ?negative Renal ROS  ?negative genitourinary ?  ?Musculoskeletal ? ? Abdominal ?  ?Peds ? Hematology ? ?(+) Blood dyscrasia, anemia ,   ?Anesthesia Other Findings ? ? Reproductive/Obstetrics ?negative OB ROS ? ?  ? ? ? ? ? ? ? ? ? ? ? ? ? ?  ?  ? ? ? ? ? ? ? ? ?Anesthesia Physical ? ?Anesthesia Plan ? ?ASA: 3 ? ?Anesthesia Plan: General  ? ?Post-op Pain Management:   ? ?Induction:  ? ?PONV Risk Score and Plan: Propofol infusion ? ?Airway Management Planned:  ? ?Additional Equipment:  ? ?Intra-op Plan:  ? ?Post-operative Plan:  ? ?Informed Consent: I have reviewed the patients History and Physical, chart, labs and discussed the procedure including the risks, benefits and alternatives for the proposed anesthesia with the patient or authorized representative who has indicated his/her understanding and acceptance.  ? ? ? ?Dental Advisory Given ? ?Plan Discussed with: CRNA ? ?Anesthesia Plan Comments:   ? ? ? ? ? ? ?Anesthesia Quick Evaluation ? ?

## 2022-01-30 NOTE — Transfer of Care (Signed)
Immediate Anesthesia Transfer of Care Note ? ?Patient: Jeremy [name] ? ?Procedure(s) Performed: ESOPHAGOGASTRODUODENOSCOPY (EGD) WITH PROPOFOL ?BIOPSY ? ?Patient Location: Short Stay ? ?Anesthesia Type:General ? ?Level of Consciousness: drowsy ? ?Airway & Oxygen Therapy: Patient Spontanous Breathing ? ?Post-op Assessment: Report given to RN and Post -op Vital signs reviewed and stable ? ?Post vital signs: Reviewed and stable ? ?Last Vitals:  ?Vitals Value Taken Time  ?BP    ?Temp    ?Pulse    ?Resp    ?SpO2    ? ? ?Last Pain:  ?Vitals:  ? 01/30/22 1106  ?TempSrc:   ?PainSc: 0-No pain  ?   ? ?Patients Stated Pain Goal: 8 (01/30/22 0919) ? ?Complications: No notable events documented. ?

## 2022-01-30 NOTE — Anesthesia Postprocedure Evaluation (Signed)
Anesthesia Post Note ? ?Patient: Jeremy [name] ? ?Procedure(s) Performed: ESOPHAGOGASTRODUODENOSCOPY (EGD) WITH PROPOFOL ?BIOPSY ? ?Patient location during evaluation: Phase II ?Anesthesia Type: General ?Level of consciousness: awake ?Pain management: pain level controlled ?Vital Signs Assessment: post-procedure vital signs reviewed and stable ?Respiratory status: spontaneous breathing and respiratory function stable ?Cardiovascular status: blood pressure returned to baseline and stable ?Postop Assessment: no headache and no apparent nausea or vomiting ?Anesthetic complications: no ?Comments: Late entry ? ? ?No notable events documented. ? ? ?Last Vitals:  ?Vitals:  ? 01/30/22 0919 01/30/22 1124  ?BP: 124/61 (!) 92/43  ?Pulse: 67   ?Resp: 14 19  ?Temp: 36.7 ?C 36.4 ?C  ?SpO2: 100% 97%  ?  ?Last Pain:  ?Vitals:  ? 01/30/22 1124  ?TempSrc: Oral  ?PainSc: 0-No pain  ? ? ?  ?  ?  ?  ?  ?  ? ?[place]ell [place] ? ? ? ? ?

## 2022-01-30 NOTE — Discharge Instructions (Addendum)
EGD ?Discharge instructions ?Please read the instructions outlined below and refer to this sheet in the next few weeks. These discharge instructions provide you with general information on caring for yourself after you leave the hospital. Your doctor may also give you specific instructions. While your treatment has been planned according to the most current medical practices available, unavoidable complications occasionally occur. If you have any problems or questions after discharge, please call your doctor. ?ACTIVITY ?You may resume your regular activity but move at a slower pace for the next 24 hours.  ?Take frequent rest periods for the next 24 hours.  ?Walking will help expel (get rid of) the air and reduce the bloated feeling in your abdomen.  ?No driving for 24 hours (because of the anesthesia (medicine) used during the test).  ?You may shower.  ?Do not sign any important legal documents or operate any machinery for 24 hours (because of the anesthesia used during the test).  ?NUTRITION ?Drink plenty of fluids.  ?You may resume your normal diet.  ?Begin with a light meal and progress to your normal diet.  ?Avoid alcoholic beverages for 24 hours or as instructed by your caregiver.  ?MEDICATIONS ?You may resume your normal medications unless your caregiver tells you otherwise.  ?WHAT YOU CAN EXPECT TODAY ?You may experience abdominal discomfort such as a feeling of fullness or ?gas? pains.  ?FOLLOW-UP ?Your doctor will discuss the results of your test with you.  ?SEEK IMMEDIATE MEDICAL ATTENTION IF ANY OF THE FOLLOWING OCCUR: ?Excessive nausea (feeling sick to your stomach) and/or vomiting.  ?Severe abdominal pain and distention (swelling).  ?Trouble swallowing.  ?Temperature over 101? F (37.8? C).  ?Rectal bleeding or vomiting of blood.  ? ?You have an ulcer at the end portion of your stomach.  I took biopsies of this.  Also have evidence of inflammation throughout your stomach which I biopsied as well.  Avoid  NSAIDs.  Continue to take pantoprazole.  Ideally you would take this medication twice a day for 8 to 12 weeks.  Await pathology results my office will contact you.  Your Barrett's esophagus looks stable.  I did take extensive biopsies of this area as well. ? ?Follow-up with GI in 4 to 6 months. ? ? ?I hope you have a great rest of your week! ? ?Elon Alas. Abbey Chatters, D.O. ?Gastroenterology and Hepatology ?[facility] ? ?

## 2022-01-30 NOTE — Op Note (Signed)
[facility] ?Patient Name: Jeremy [name] ?Procedure Date: 01/30/2022 10:40 AM ?MRN: [id number] ?Date of Birth: Feb 16, 1961 ?Attending MD: Elon Alas. Abbey Chatters , DO ?CSN: [id number] ?Age: 61 ?Admit Type: Outpatient ?Procedure:                Upper GI endoscopy ?Indications:              Functional Dyspepsia, Follow-up of Barrett's  ?                          esophagus ?Providers:                Elon Alas. Abbey Chatters, DO, Janeece Riggers, RN, Janett Billow  ?                          Boudreaux, Nelma Rothman, Technician, [employer],  ?                          Technician ?Referring MD:              ?Medicines:                See the Anesthesia note for documentation of the  ?                          administered medications ?Complications:            No immediate complications. ?Estimated Blood Loss:     Estimated blood loss was minimal. ?Procedure:                Pre-Anesthesia Assessment: ?                          - The anesthesia plan was to use monitored  ?                          anesthesia care (MAC). ?                          After obtaining informed consent, the endoscope was  ?                          passed under direct vision. Throughout the  ?                          procedure, the patient's blood pressure, pulse, and  ?                          oxygen saturations were monitored continuously. The  ?                          GIF-H190 ([id number]) scope was introduced through the  ?                          mouth, and advanced to the efferent jejunal loop.  ?                          The upper GI endoscopy was accomplished without  ?  difficulty. The patient tolerated the procedure  ?                          well. ?Scope In: 11:05:46 AM ?Scope Out: 11:20:04 AM ?Total Procedure Duration: 0 hours 14 minutes 18 seconds  ?Findings: ?     The esophagus and gastroesophageal junction were examined with white  ?     light and narrow band imaging (NBI) from a forward view and retroflexed  ?     position.  There were esophageal mucosal changes secondary to established  ?     long-segment Barrett's disease. These changes involved the mucosa at the  ?     upper extent of the gastric folds (40 cm from the incisors) extending to  ?     the Z-line (30 cm from the incisors). Circumferential salmon-colored  ?     mucosa was present from 31 to 40 cm and two tongues of salmon-colored  ?     mucosa were present from 39 to 40 cm. The maximum longitudinal extent of  ?     these esophageal mucosal changes was 10 cm in length. Mucosa was  ?     biopsied with a cold forceps for histology in 4 quadrants at intervals  ?     of 2 cm. ?     Evidence of a gastrojejunostomy was found. A gastric pouch with a normal  ?     size was found. The staple line appeared intact. The gastrojejunal  ?     anastomosis was characterized by healthy appearing mucosa. This was  ?     traversed. Native pylorus with 5 mm ulcer, biopsied with colod forceps  ?     for histology. Duodenum with previously noted stricture seen. ?     Diffuse moderate inflammation characterized by erythema and linear  ?     erosions was found in the entire examined stomach. Biopsies were taken  ?     with a cold forceps for Helicobacter pylori testing. ?Impression:               - Esophageal mucosal changes secondary to  ?                          established long-segment Barrett's disease.  ?                          Biopsied. ?                          - Gastric bypass with a normal-sized pouch and  ?                          intact staple line. Gastrojejunal anastomosis  ?                          characterized by healthy appearing mucosa. ?                          - Gastritis. Biopsied. ?Moderate Sedation: ?     Per Anesthesia Care ?Recommendation:           - Patient has a contact number available for  ?  emergencies. The signs and symptoms of potential  ?                          delayed complications were discussed with the  ?                           patient. Return to normal activities tomorrow.  ?                          Written discharge instructions were provided to the  ?                          patient. ?                          - Resume previous diet. ?                          - Continue present medications. ?                          - Await pathology results. ?                          - Repeat upper endoscopy in 3 years for  ?                          surveillance of Barrett's esophagus. ?                          - Return to GI office in 4 months. ?Procedure Code(s):        --- Professional --- ?                          [phone], Esophagogastroduodenoscopy, flexible,  ?                          transoral; with biopsy, single or multiple ?Diagnosis Code(s):        --- Professional --- ?                          K22.70, Barrett's esophagus without dysplasia ?                          Z98.84, Bariatric surgery status ?                          K29.70, Gastritis, unspecified, without bleeding ?                          K30, Functional dyspepsia ?CPT copyright 2019 American Medical Association. All rights reserved. ?The codes documented in this report are preliminary and upon coder review may  ?be revised to meet current compliance requirements. ?Elon Alas. Abbey Chatters, DO ?Elon Alas. Abbey Chatters, DO ?01/30/2022 11:29:16 AM ?This report has been signed electronically. ?Number of Addenda: 0 ?

## 2022-01-30 NOTE — H&P (Signed)
Primary Care Physician:  [name], MD ?Primary Gastroenterologist:  Dr. Abbey Chatters ? ?Pre-Procedure History & Physical: ?HPI:  Jeremy [name] is a 61 y.o. male is here for an EGD for dyspepsia. 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  This failed conservative management with PPI therapy and dilation.  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 Arnoldo Morale on 01/03/2021.  He also has a history of Barrett's on EGD in 2021. No esophagitis noted on EGD in 2022. Last esophageal biopsies in 2020. He has been dealing with IDA since procedure. Requiring iron infusions.  ?  ?Will go without  BM for 4-5 days, then will have BMs all day but not loose. No fiber. No abdominal pain. Sometimes BM not as much and sometimes a lot. No rectal bleeding. Stool is usually green.  ?  ?No dysphagia. Protonix 2 a week. Takes Tuesday evening and Friday evening. Lots of burping when eating. No vomiting. Burps multiple times after eating. Anything dairy will cause gas. If takes Protonix daily, feels like a brick in his stomach.  ? ?Past Medical History:  ?Diagnosis Date  ? Diabetes mellitus without complication ([place])   ? GERD (gastroesophageal reflux disease)   ? Hyperlipidemia   ? Hypertension   ? IDA (iron deficiency anemia)   ? Rash of entire body 03/2016  ? Sleep apnea   ? ? ?Past Surgical History:  ?Procedure Laterality Date  ? [place]  ? lumbar  ? BIOPSY  02/07/2019  ? Procedure: BIOPSY;  Surgeon: Danie Binder, MD;  [facility];  Service: Endoscopy;;  ? [place]  ? COLONOSCOPY N/A 02/07/2019  ? Dr. Oneida Alar: External hemorrhoids next colonoscopy in 10 years  ? ESOPHAGOGASTRODUODENOSCOPY N/A 02/07/2019  ? Dr. Oneida Alar: Barrett's esophagus without dysplasia chronic inactive gastritis but no H. 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  ESOPHAGOGASTRODUODENOSCOPY N/A 01/05/2020  ? Procedure: ESOPHAGOGASTRODUODENOSCOPY (EGD);  Surgeon: Danie Binder, MD;  [facility];  Service: Endoscopy;  Laterality: N/A;  10:30am  ? ESOPHAGOGASTRODUODENOSCOPY (EGD) WITH PROPOFOL N/A 12/06/2020  ? Procedure: ESOPHAGOGASTRODUODENOSCOPY (EGD) WITH PROPOFOL;  Surgeon: Eloise Harman, DO;  [facility];  Service: Endoscopy;  Laterality: N/A;  11:45am  ? ESOPHAGOGASTRODUODENOSCOPY (EGD) WITH PROPOFOL N/A 12/22/2021  ? Procedure: ESOPHAGOGASTRODUODENOSCOPY (EGD) WITH PROPOFOL;  Surgeon: Eloise Harman, DO;  [facility];  Service: Endoscopy;  Laterality: N/A;  10:45am, ASA 3  ? GASTROJEJUNOSTOMY N/A 01/03/2021  ? Procedure: GASTROJEJUNOSTOMY;  Surgeon: Aviva Signs, MD;  Location: [facility];  Service: General;  Laterality: N/A;  ? HAND SURGERY    ? SAVORY DILATION N/A 01/05/2020  ? Procedure: SAVORY DILATION;  Surgeon: Danie Binder, MD;  [facility];  Service: Endoscopy;  Laterality: N/A;  ? ? ?Prior to Admission medications   ?Medication Sig Start Date End Date Taking? Authorizing Provider  ?acetaminophen (TYLENOL) 500 MG tablet Take 1,000 mg by mouth in the morning and at bedtime.    Yes [provider]  ?cyclobenzaprine (FLEXERIL) 10 MG tablet Take 1 tablet (10 mg total) by mouth 2 (two) times daily as needed for muscle spasms. ?Patient taking differently: Take 10 mg by mouth in the morning and at bedtime. 09/30/21  Yes [name], MD  ?ferrous sulfate (FEROSUL) 325 (65 FE) MG tablet Take 1 tablet (325 mg total) by  mouth 2 (two) times daily with a meal. 01/02/22  Yes [name], MD  ?fluticasone (FLONASE) 50 MCG/ACT nasal spray Place 1 spray into both nostrils daily as needed for allergies or rhinitis. ?Patient taking differently: Place 1 spray into both nostrils at bedtime. 11/10/21  Yes [name], MD  ?glipiZIDE (GLUCOTROL) 5 MG tablet TAKE 1 TABLET BY MOUTH IN THE MORNINGS AND 1 TABLET  IN THE EVENINGS. 01/20/22 01/20/23 Yes [name], MD  ?lisinopril (ZESTRIL) 40 MG tablet Take 0.5 tablets (20 mg total) by mouth in the morning and at bedtime. 01/02/22  Yes [name], MD  ?loperamide (IMODIUM A-D) 2 MG tablet Take 2 mg by mouth 4 (four) times daily as needed for diarrhea or loose stools.   Yes [provider]  ?loratadine (CLARITIN) 10 MG tablet Take 10 mg by mouth daily.   Yes [provider]  ?Magnesium Oxide 200 MG TABS Take 200 mg by mouth daily.   Yes [provider]  ?naproxen sodium (ALEVE) 220 MG tablet Take 440 mg by mouth in the morning and at bedtime.   Yes [provider]  ?ondansetron (ZOFRAN) 8 MG tablet Take 1 tablet (8 mg total) by mouth daily as needed for nausea or vomiting. 08/22/21  Yes [name], MD  ?pantoprazole (PROTONIX) 40 MG tablet TAKE 1 TABLET (40 MG TOTAL) BY MOUTH 2 (TWO) TIMES DAILY BEFORE A MEAL. ?Patient taking differently: Take 40 mg by mouth daily. 05/20/21 05/20/22 Yes [name], MD  ?Potassium 99 MG TABS Take 99 mg by mouth 2 (two) times daily.   Yes [provider]  ?psyllium (METAMUCIL) 58.6 % powder Take 1 packet by mouth daily.   Yes [provider]  ?rOPINIRole (REQUIP) 0.5 MG tablet TAKE 1 TABLET (0.5 MG TOTAL) BY MOUTH AT BEDTIME. 09/30/21 09/30/22 Yes [name], MD  ?sodium chloride 1 g tablet Take 2 tablets (2 g total) by mouth daily. ?Patient taking differently: Take 1 g by mouth at bedtime. 07/17/21  Yes Renato Shin, MD  ?tamsulosin (FLOMAX) 0.4 MG CAPS capsule Take 1 capsule (0.4 mg total) by mouth at bedtime. 01/02/22  Yes [name], MD  ?traMADol (ULTRAM) 50 MG tablet Take 1 tablet (50 mg total) by mouth every 12 (twelve) hours as needed ?Patient taking differently: Take 50 mg by mouth 2 (two) times daily. 12/07/21  Yes [name], MD  ?vitamin B-12 (CYANOCOBALAMIN) 1000 MCG tablet Take 1,000 mcg by mouth daily.   Yes [provider]   ?Blood Glucose Monitoring Suppl (TRUE METRIX METER) w/Device KIT 1 each by Does not apply route 4 (four) times daily -  before meals and at bedtime. 04/12/16   Elsie Stain, MD  ?glucose blood test strip Use as instructed 07/28/20   [name], MD  ?sildenafil (VIAGRA) 50 MG tablet Take 50 mg by mouth daily as needed for erectile dysfunction.    [provider]  ? ? ?Allergies as of 12/29/2021 - Review Complete 12/22/2021  ?Allergen Reaction Noted  ? Cymbalta [duloxetine hcl] Other (See Comments) 06/05/2017  ? Doxycycline  03/02/2021  ? Other Nausea And Vomiting 01/24/2019  ? Robaxin [methocarbamol] Other (See Comments) 06/02/2019  ? ? ?Family History  ?Problem Relation Age of Onset  ? Breast cancer Mother   ? Pancreatic cancer Mother   ? CAD Father   ? Colon cancer Paternal Grandfather   ? ? ?Social History  ? ?Socioeconomic History  ?  Marital status: Significant Other  ?  Spouse name: Not on file  ? Number of children: Not on file  ? Years of education: Not on file  ? Highest education level: Not on file  ?Occupational History  ? Occupation: Clinical biochemist  ?Tobacco Use  ? Smoking status: Former  ?  Types: Cigarettes  ?  Quit date: 10/16/1988  ?  Years since quitting: 33.3  ? Smokeless tobacco: Never  ?Vaping Use  ? Vaping Use: Never used  ?Substance and Sexual Activity  ? Alcohol use: No  ? Drug use: Yes  ?  Comment: rare  ? Sexual activity: Not on file  ?Other Topics Concern  ? Not on file  ?Social History Narrative  ? Volunteers as Airline pilot  ? ?Social Determinants of Health  ? ?Financial Resource Strain: Not on file  ?Food Insecurity: Not on file  ?Transportation Needs: Not on file  ?Physical Activity: Not on file  ?Stress: Not on file  ?Social Connections: Not on file  ?Intimate Partner Violence: Not on file  ? ? ?Review of Systems: ?See HPI, otherwise negative ROS ? ?Physical Exam: ?Vital signs in last 24 hours: ?Temp:  [98 ?F (36.7 ?C)] 98 ?F (36.7 ?C) (04/17 0919) ?Pulse Rate:   [67] 67 (04/17 0919) ?Resp:  [14] 14 (04/17 0919) ?BP: (124)/(61) 124/61 (04/17 0919) ?SpO2:  [100 %] 100 % (04/17 0919) ?Weight:  [104 kg] 104 kg (04/17 0919) ?  ?General:   Alert,  Well-developed, well-nourishe

## 2022-02-01 ENCOUNTER — Other Ambulatory Visit: Payer: Self-pay

## 2022-02-01 LAB — SURGICAL PATHOLOGY

## 2022-02-02 ENCOUNTER — Encounter (HOSPITAL_COMMUNITY): Payer: Self-pay | Admitting: Internal Medicine

## 2022-02-06 ENCOUNTER — Encounter: Payer: Self-pay | Admitting: Physician Assistant

## 2022-02-06 ENCOUNTER — Other Ambulatory Visit: Payer: Self-pay

## 2022-02-06 ENCOUNTER — Encounter: Payer: Self-pay | Admitting: Internal Medicine

## 2022-02-09 ENCOUNTER — Other Ambulatory Visit: Payer: Self-pay

## 2022-02-12 ENCOUNTER — Other Ambulatory Visit: Payer: Self-pay | Admitting: Internal Medicine

## 2022-02-13 ENCOUNTER — Encounter: Payer: Self-pay | Admitting: Physician Assistant

## 2022-02-13 ENCOUNTER — Encounter: Payer: Self-pay | Admitting: Internal Medicine

## 2022-02-13 ENCOUNTER — Other Ambulatory Visit: Payer: Self-pay

## 2022-02-13 MED ORDER — CYCLOBENZAPRINE HCL 10 MG PO TABS
10.0000 mg | ORAL_TABLET | Freq: Two times a day (BID) | ORAL | 3 refills | Status: DC | PRN
Start: 2022-02-13 — End: 2022-06-19
  Filled 2022-02-13: qty 60, 30d supply, fill #0
  Filled 2022-03-11: qty 60, 30d supply, fill #1
  Filled 2022-04-21: qty 60, 30d supply, fill #2
  Filled 2022-05-22: qty 60, 30d supply, fill #3

## 2022-02-13 NOTE — Progress Notes (Signed)
? ?Subjective:  ? ? Patient ID: Jeremy [name], male    DOB: 07-23-1961, 61 y.o.   MRN: [id number] ? ?HPI ? ? ?HYPONATREMIA:  ? ?He had a sodium 128 when being seen for weakness in the emergency room in 02/2021  ?At that time he was having some reaction to an antibiotic and was drinking a lot of fluids  ?His urine osmolality was over 400 and urine sodium checked twice was between 50 and 63 ? ?Other evaluation was negative ?He was told to take salt tablets but only is taking 1 g daily total  ?His hyponatremia resolved within a month and sodium has been consistently normal since then ?He is not taking any thiazide diuretics, SSRI drugs or narcotic pain medications ? ?Lab Results  ?Component Value Date  ? NA 139 11/25/2021  ? K 4.4 11/25/2021  ? CL 105 11/25/2021  ? CO2 27 11/25/2021  ? ? ?DIABETES: He had significant hyperglycemia in 2017 but apparently with his weight loss after gastrojejunostomy his blood sugars are much better ?He is taking only glipizide and not metformin currently ?He says that especially if he is skipping lunch he will feel very weak in the afternoon and blood sugar may be in the 40s ?Does not check sugars consistently and only when he feels bad, recent blood sugar reading 115-122 ? ? ?Lab Results  ?Component Value Date  ? HGBA1C 5.7 01/02/2022  ? HGBA1C 5.6 05/17/2021  ? HGBA1C 7.2 (H) 01/03/2021  ? ?Lab Results  ?Component Value Date  ? MICROALBUR 2.5 12/25/2016  ? [place] 81 02/05/2020  ? CREATININE 0.76 11/25/2021  ? ? ? ?Past Medical History:  ?Diagnosis Date  ? Diabetes mellitus without complication ([place])   ? GERD (gastroesophageal reflux disease)   ? Hyperlipidemia   ? Hypertension   ? IDA (iron deficiency anemia)   ? Rash of entire body 03/2016  ? Sleep apnea   ? ? ?Past Surgical History:  ?Procedure Laterality Date  ? [place]  ? lumbar  ? BIOPSY  02/07/2019  ? Procedure: BIOPSY;  Surgeon: Danie Binder, MD;  [facility];  Service: Endoscopy;;  ? BIOPSY  01/30/2022   ? Procedure: BIOPSY;  Surgeon: Eloise Harman, DO;  [facility];  Service: Endoscopy;;  ? CERVICAL SPINE SURGERY  2000  ? COLONOSCOPY N/A 02/07/2019  ? Dr. Oneida Alar: External hemorrhoids next colonoscopy in 10 years  ? ESOPHAGOGASTRODUODENOSCOPY N/A 02/07/2019  ? Dr. Oneida Alar: Barrett's esophagus without dysplasia chronic inactive gastritis but no H. 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 ESOPHAGOGASTRODUODENOSCOPY N/A 01/05/2020  ? Procedure: ESOPHAGOGASTRODUODENOSCOPY (EGD);  Surgeon: Danie Binder, MD;  [facility];  Service: Endoscopy;  Laterality: N/A;  10:30am  ? ESOPHAGOGASTRODUODENOSCOPY (EGD) WITH PROPOFOL N/A 12/06/2020  ? Procedure: ESOPHAGOGASTRODUODENOSCOPY (EGD) WITH PROPOFOL;  Surgeon: Eloise Harman, DO;  [facility];  Service: Endoscopy;  Laterality: N/A;  11:45am  ? ESOPHAGOGASTRODUODENOSCOPY (EGD) WITH PROPOFOL N/A 12/22/2021  ? Procedure: ESOPHAGOGASTRODUODENOSCOPY (EGD) WITH PROPOFOL;  Surgeon: Eloise Harman, DO;  [facility];  Service: Endoscopy;  Laterality: N/A;  10:45am, ASA 3  ? ESOPHAGOGASTRODUODENOSCOPY (EGD) WITH PROPOFOL N/A 01/30/2022  ? Procedure: ESOPHAGOGASTRODUODENOSCOPY (EGD) WITH PROPOFOL;  Surgeon: Eloise Harman, DO;  [facility];  Service: Endoscopy;  Laterality: N/A;  11:00am  ? GASTROJEJUNOSTOMY N/A 01/03/2021  ? Procedure: GASTROJEJUNOSTOMY;  Surgeon: Arnoldo Morale,  Mark, MD;  Location: [facility];  Service: General;  Laterality: N/A;  ? HAND SURGERY    ? SAVORY DILATION N/A 01/05/2020  ? Procedure: SAVORY DILATION;  Surgeon: Fields, Sandi L, MD;  [facility];  Service: Endoscopy;  Laterality: N/A;  ? ? ?Social History  ? ?Socioeconomic History  ? Marital status: Significant Other  ?  Spouse name: Not on file  ? Number of children: Not on file  ? Years of education: Not on file  ? Highest education level: Not on  file  ?Occupational History  ? Occupation: general contractor  ?Tobacco Use  ? Smoking status: Former  ?  Types: Cigarettes  ?  Quit date: 10/16/1988  ?  Years since quitting: 33.3  ? Smokeless tobacco: Never  ?Vaping Use  ? Vaping Use: Never used  ?Substance and Sexual Activity  ? Alcohol use: No  ? Drug use: Yes  ?  Comment: rare  ? Sexual activity: Not on file  ?Other Topics Concern  ? Not on file  ?Social History Narrative  ? Volunteers as firefighter  ? ?Social Determinants of Health  ? ?Financial Resource Strain: Not on file  ?Food Insecurity: Not on file  ?Transportation Needs: Not on file  ?Physical Activity: Not on file  ?Stress: Not on file  ?Social Connections: Not on file  ?Intimate Partner Violence: Not on file  ? ? ?Current Outpatient Medications on File Prior to Visit  ?Medication Sig Dispense Refill  ? acetaminophen (TYLENOL) 500 MG tablet Take 1,000 mg by mouth in the morning and at bedtime.     ? Blood Glucose Monitoring Suppl (TRUE METRIX METER) w/Device KIT 1 each by Does not apply route 4 (four) times daily -  before meals and at bedtime. 1 kit 0  ? cyclobenzaprine (FLEXERIL) 10 MG tablet Take 1 tablet (10 mg total) by mouth 2 (two) times daily as needed for muscle spasms. 60 tablet 3  ? ferrous sulfate (FEROSUL) 325 (65 FE) MG tablet Take 1 tablet (325 mg total) by mouth 2 (two) times daily with a meal. 180 tablet 2  ? fluticasone (FLONASE) 50 MCG/ACT nasal spray Place 1 spray into both nostrils daily as needed for allergies or rhinitis. (Patient taking differently: Place 1 spray into both nostrils at bedtime.) 16 g 0  ? glipiZIDE (GLUCOTROL) 5 MG tablet TAKE 1 TABLET BY MOUTH IN THE MORNINGS AND 1 TABLET IN THE EVENINGS. 60 tablet 2  ? glucose blood test strip Use as instructed 100 each 12  ? lisinopril (ZESTRIL) 40 MG tablet Take 0.5 tablets (20 mg total) by mouth in the morning and at bedtime. 90 tablet 3  ? loperamide (IMODIUM A-D) 2 MG tablet Take 2 mg by mouth 4 (four) times daily as needed  for diarrhea or loose stools.    ? loratadine (CLARITIN) 10 MG tablet Take 10 mg by mouth daily.    ? Magnesium Oxide 200 MG TABS Take 200 mg by mouth daily.    ? naproxen sodium (ALEVE) 220 MG tablet Take 440 mg by mouth in the morning and at bedtime.    ? ondansetron (ZOFRAN) 8 MG tablet Take 1 tablet (8 mg total) by mouth daily as needed for nausea or vomiting. 30 tablet 0  ? pantoprazole (PROTONIX) 40 MG tablet TAKE 1 TABLET (40 MG TOTAL) BY MOUTH 2 (TWO) TIMES DAILY BEFORE A MEAL. (Patient taking differently: Take 40 mg by mouth daily.) 60 tablet 3  ? Potassium 99 MG TABS Take   99 mg by mouth 2 (two) times daily.    ? psyllium (METAMUCIL) 58.6 % powder Take 1 packet by mouth daily.    ? rOPINIRole (REQUIP) 0.5 MG tablet TAKE 1 TABLET (0.5 MG TOTAL) BY MOUTH AT BEDTIME. 90 tablet 1  ? sildenafil (VIAGRA) 50 MG tablet Take 50 mg by mouth daily as needed for erectile dysfunction.    ? sodium chloride 1 g tablet Take 2 tablets (2 g total) by mouth daily. (Patient taking differently: Take 1 g by mouth at bedtime.) 180 tablet 3  ? tamsulosin (FLOMAX) 0.4 MG CAPS capsule Take 1 capsule (0.4 mg total) by mouth at bedtime. 90 capsule 3  ? traMADol (ULTRAM) 50 MG tablet Take 1 tablet (50 mg total) by mouth every 12 (twelve) hours as needed (Patient taking differently: Take 50 mg by mouth 2 (two) times daily.) 60 tablet 2  ? vitamin B-12 (CYANOCOBALAMIN) 1000 MCG tablet Take 1,000 mcg by mouth daily.    ? ?No current facility-administered medications on file prior to visit.  ? ? ?Allergies  ?Allergen Reactions  ? Cymbalta [Duloxetine Hcl] Other (See Comments)  ?  Caused anorgasmia  ? Doxycycline   ?  Unknown  ? Other Nausea And Vomiting  ?  Mayonnaise mustard ketchup  ? Robaxin [Methocarbamol] Other (See Comments)  ?  Had hiccups x 4 hours after taking  ? ? ?Family History  ?Problem Relation Age of Onset  ? Breast cancer Mother   ? Pancreatic cancer Mother   ? CAD Father   ? Colon cancer Paternal Grandfather   ? ? ?Review  of Systems ? ?Has been treated for hypertension ?   ?Objective:  ? Physical Exam ? ?BP (!) 142/68   Pulse 61   Ht 5' 10" (1.778 m)   Wt 223 lb 3.2 oz (101.2 kg)   SpO2 98%   BMI 32.03 kg/m?  ? ?Thyroid n

## 2022-02-14 ENCOUNTER — Other Ambulatory Visit: Payer: Self-pay

## 2022-02-14 ENCOUNTER — Ambulatory Visit (INDEPENDENT_AMBULATORY_CARE_PROVIDER_SITE_OTHER): Payer: Self-pay | Admitting: Endocrinology

## 2022-02-14 VITALS — BP 142/68 | HR 61 | Ht 70.0 in | Wt 223.2 lb

## 2022-02-14 DIAGNOSIS — E871 Hypo-osmolality and hyponatremia: Secondary | ICD-10-CM

## 2022-02-14 DIAGNOSIS — E1169 Type 2 diabetes mellitus with other specified complication: Secondary | ICD-10-CM

## 2022-02-14 DIAGNOSIS — E669 Obesity, unspecified: Secondary | ICD-10-CM

## 2022-02-14 LAB — BASIC METABOLIC PANEL
BUN: 13 mg/dL (ref 6–23)
CO2: 27 mEq/L (ref 19–32)
Calcium: 8.9 mg/dL (ref 8.4–10.5)
Chloride: 105 mEq/L (ref 96–112)
Creatinine, Ser: 0.86 mg/dL (ref 0.40–1.50)
GFR: 95.34 mL/min (ref >60.00)
Glucose, Bld: 113 mg/dL — ABNORMAL HIGH (ref 70–99)
Potassium: 3.9 mEq/L (ref 3.5–5.1)
Sodium: 139 mEq/L (ref 135–145)

## 2022-02-14 NOTE — Patient Instructions (Addendum)
Stop am Glipizide annd salt ?

## 2022-02-15 NOTE — Progress Notes (Signed)
Please call to let patient know that the lab results are normal and no further action needed

## 2022-02-17 ENCOUNTER — Encounter: Payer: Self-pay | Admitting: Internal Medicine

## 2022-02-17 ENCOUNTER — Ambulatory Visit: Payer: Self-pay | Attending: Internal Medicine | Admitting: Internal Medicine

## 2022-02-17 VITALS — BP 132/65 | HR 74 | Ht 70.0 in | Wt 221.6 lb

## 2022-02-17 DIAGNOSIS — R140 Abdominal distension (gaseous): Secondary | ICD-10-CM

## 2022-02-17 DIAGNOSIS — T148XXA Other injury of unspecified body region, initial encounter: Secondary | ICD-10-CM

## 2022-02-17 DIAGNOSIS — S80211S Abrasion, right knee, sequela: Secondary | ICD-10-CM

## 2022-02-17 DIAGNOSIS — K2270 Barrett's esophagus without dysplasia: Secondary | ICD-10-CM

## 2022-02-17 NOTE — Progress Notes (Signed)
Abdominal bloating ?Wound on knee ?Flank pain. ? ?

## 2022-02-17 NOTE — Patient Instructions (Signed)
Try to take pantoprazole at least once a day at bedtime. ? ?Use your Flexeril as needed for the pulled muscle.  You can also use some IcyHot rub which can be purchased over-the-counter. ?

## 2022-02-17 NOTE — Progress Notes (Signed)
? ? ?Patient ID: Jeremy [name], male    DOB: 05-Oct-1961  MRN: [id number] ? ?CC: Bloated ? ? ?Subjective: ?Jeremy [name] is a 61 y.o. male who presents for urgent care visit ?His concerns today include:  ?Pt with hx of DM, HL, HTN,GERD, IDA (Barrett's esophagus and moderate gastritis thought to be NSAID induced on EGD; iron infusions by Dr. 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  Myoview neg 02/2021 ?  ?Yesterday he was leaning over commode trying to drive a nail into the floor, felt something moved on RT lateral rib cage.  "Like if you caught your arm in the door and pulled it out."  He stopped what he was doing.  Immediate pain went away.  Sore last night on that side when laying in bed.  ? ?Bloating: On last visit he complained with bloating when he increased Protonix to daily dosing.  I had recommended that he takes it 4-5 times a week for 2 to 3 weeks then try to increase it to every day.   ?-Since then, he worked up to twice a day dosing but noted that food felt as if it just sat in his stomach.  So we went back to taking it once a day 3-4 times a week.  Had recent EGD repeat that showed Barrett's esophagus again but without dysplastic or malignant changes.  He had questions about whether the EGD help to determine the type of surgery that he had done last year because of duodenal stenosis.  I told him that it did show evidence of gastrojejunostomy  ? ?Patient had messaged me 01/23/2022 with a picture showing abrasion just below the right kneecap.  He was using antibiotic ointment.  It has continued to improve.  He wanted me to take a look at it today.  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  He does have kneepads but does not wear consistently when having to work on his knees. ? ?Since last visit he was also seen by a  new endocrinologist Dr. Dwyane Dee.  He felt that patient could stop the sodium tablets as his sodium has remained normal for the past year.  He also recommended cutting back on glipizide from 5 mg twice a day to 5 mg once a day. ?Patient Active Problem List  ? Diagnosis Date Noted  ? Hyponatremia 06/23/2021  ? Low serum vitamin B12 03/23/2021  ? Vitamin D deficiency 03/23/2021  ? S/P bypass gastrojejunostomy 01/03/2021  ? Gastric outlet obstruction   ? Duodenal stricture   ? Primary osteoarthritis of both hips 11/15/2020  ? Aortic atherosclerosis ([place]) 08/30/2020  ? Coronary artery calcification 08/30/2020  ? Bilateral carpal tunnel syndrome 06/07/2020  ? Grief reaction 06/07/2020  ? Pyloric stenosis in adult 02/05/2020  ? Congenital hypertrophic pyloric stenosis   ? Duodenal web   ? Barrett's esophagus without dysplasia 05/19/2019  ? OSA (obstructive sleep apnea) 04/30/2019  ? Iron deficiency anemia 11/25/2018  ? Phimosis of penis 11/19/2018  ? Esophageal dysphagia 08/19/2018  ? Benign prostatic hyperplasia with post-void dribbling 01/31/2018  ? Absence of bladder continence 01/31/2018  ? Obesity (BMI 30-39.9) 11/05/2017  ? GERD (gastroesophageal reflux disease) 06/03/2017  ? Chronic pain of both knees 06/03/2017  ? Chronic lower back pain 06/03/2017  ? Type 2 diabetes mellitus without  complication, without long-term current use of insulin ([place]) 04/12/2016  ? Essential hypertension 04/12/2016  ?  ? ?Current Outpatient Medications on File Prior to Visit  ?Medication Sig Dispense Refill  ? acetaminophen (TYLENOL) 500 MG tablet Take 1,000 mg by mouth in the morning and at bedtime.     ? Blood Glucose Monitoring Suppl (TRUE METRIX METER) w/Device KIT 1 each by Does not apply route 4 (four) times daily -  before meals and at bedtime. 1 kit 0  ? cyclobenzaprine (FLEXERIL) 10 MG tablet Take 1 tablet (10 mg total) by mouth 2 (two) times daily as needed for muscle spasms. 60 tablet 3  ? ferrous sulfate (FEROSUL) 325 (65 FE) MG  tablet Take 1 tablet (325 mg total) by mouth 2 (two) times daily with a meal. 180 tablet 2  ? fluticasone (FLONASE) 50 MCG/ACT nasal spray Place 1 spray into both nostrils daily as needed for allergies or rhinitis. (Patient taking differently: Place 1 spray into both nostrils at bedtime.) 16 g 0  ? glipiZIDE (GLUCOTROL) 5 MG tablet TAKE 1 TABLET BY MOUTH IN THE MORNINGS AND 1 TABLET IN THE EVENINGS. 60 tablet 2  ? glucose blood test strip Use as instructed 100 each 12  ? lisinopril (ZESTRIL) 40 MG tablet Take 0.5 tablets (20 mg total) by mouth in the morning and at bedtime. 90 tablet 3  ? loperamide (IMODIUM A-D) 2 MG tablet Take 2 mg by mouth 4 (four) times daily as needed for diarrhea or loose stools.    ? loratadine (CLARITIN) 10 MG tablet Take 10 mg by mouth daily.    ? Magnesium Oxide 200 MG TABS Take 200 mg by mouth daily.    ? naproxen sodium (ALEVE) 220 MG tablet Take 440 mg by mouth in the morning and at bedtime.    ? ondansetron (ZOFRAN) 8 MG tablet Take 1 tablet (8 mg total) by mouth daily as needed for nausea or vomiting. 30 tablet 0  ? pantoprazole (PROTONIX) 40 MG tablet TAKE 1 TABLET (40 MG TOTAL) BY MOUTH 2 (TWO) TIMES DAILY BEFORE A MEAL. (Patient taking differently: Take 40 mg by mouth daily.) 60 tablet 3  ? Potassium 99 MG TABS Take 99 mg by mouth 2 (two) times daily.    ? psyllium (METAMUCIL) 58.6 % powder Take 1 packet by mouth daily.    ? rOPINIRole (REQUIP) 0.5 MG tablet TAKE 1 TABLET (0.5 MG TOTAL) BY MOUTH AT BEDTIME. 90 tablet 1  ? sildenafil (VIAGRA) 50 MG tablet Take 50 mg by mouth daily as needed for erectile dysfunction.    ? sodium chloride 1 g tablet Take 2 tablets (2 g total) by mouth daily. (Patient taking differently: Take 1 g by mouth at bedtime.) 180 tablet 3  ? tamsulosin (FLOMAX) 0.4 MG CAPS capsule Take 1 capsule (0.4 mg total) by mouth at bedtime. 90 capsule 3  ? traMADol (ULTRAM) 50 MG tablet Take 1 tablet (50 mg total) by mouth every 12 (twelve) hours as needed (Patient  taking differently: Take 50 mg by mouth 2 (two) times daily.) 60 tablet 2  ? vitamin B-12 (CYANOCOBALAMIN) 1000 MCG tablet Take 1,000 mcg by mouth daily.    ? ?No current facility-administered medications on file prior to visit.  ? ? ?Allergies  ?Allergen Reactions  ? Cymbalta [Duloxetine Hcl] Other (See Comments)  ?  Caused anorgasmia  ? Doxycycline   ?  Unknown  ? Other Nausea And Vomiting  ?  Mayonnaise mustard ketchup  ? Robaxin [  Methocarbamol] Other (See Comments)  ?  Had hiccups x 4 hours after taking  ? ? ?Social History  ? ?Socioeconomic History  ? Marital status: Significant Other  ?  Spouse name: Not on file  ? Number of children: Not on file  ? Years of education: Not on file  ? Highest education level: Not on file  ?Occupational History  ? Occupation: Clinical biochemist  ?Tobacco Use  ? Smoking status: Former  ?  Types: Cigarettes  ?  Quit date: 10/16/1988  ?  Years since quitting: 33.3  ? Smokeless tobacco: Never  ?Vaping Use  ? Vaping Use: Never used  ?Substance and Sexual Activity  ? Alcohol use: No  ? Drug use: Yes  ?  Comment: rare  ? Sexual activity: Not on file  ?Other Topics Concern  ? Not on file  ?Social History Narrative  ? Volunteers as Airline pilot  ? ?Social Determinants of Health  ? ?Financial Resource Strain: Not on file  ?Food Insecurity: Not on file  ?Transportation Needs: Not on file  ?Physical Activity: Not on file  ?Stress: Not on file  ?Social Connections: Not on file  ?Intimate Partner Violence: Not on file  ? ? ?Family History  ?Problem Relation Age of Onset  ? Breast cancer Mother   ? Pancreatic cancer Mother   ? CAD Father   ? Colon cancer Paternal Grandfather   ? ? ?Past Surgical History:  ?Procedure Laterality Date  ? [place]  ? lumbar  ? BIOPSY  02/07/2019  ? Procedure: BIOPSY;  Surgeon: Danie Binder, MD;  [facility];  Service: Endoscopy;;  ? BIOPSY  01/30/2022  ? Procedure: BIOPSY;  Surgeon: Eloise Harman, DO;  [facility];  Service:  Endoscopy;;  ? CERVICAL SPINE SURGERY  2000  ? COLONOSCOPY N/A 02/07/2019  ? Dr. Oneida Alar: External hemorrhoids next colonoscopy in 10 years  ? ESOPHAGOGASTRODUODENOSCOPY N/A 02/07/2019  ? Dr. Oneida AlarAhmed Prima'

## 2022-02-24 ENCOUNTER — Encounter: Payer: Self-pay | Admitting: Internal Medicine

## 2022-02-24 ENCOUNTER — Encounter: Payer: Self-pay | Admitting: Endocrinology

## 2022-02-27 ENCOUNTER — Encounter: Payer: Self-pay | Admitting: Internal Medicine

## 2022-02-27 ENCOUNTER — Inpatient Hospital Stay: Payer: Self-pay | Attending: Physician Assistant

## 2022-02-27 ENCOUNTER — Other Ambulatory Visit: Payer: Self-pay

## 2022-02-27 ENCOUNTER — Inpatient Hospital Stay (HOSPITAL_BASED_OUTPATIENT_CLINIC_OR_DEPARTMENT_OTHER): Payer: Self-pay | Admitting: Internal Medicine

## 2022-02-27 ENCOUNTER — Encounter: Payer: Self-pay | Admitting: Physician Assistant

## 2022-02-27 ENCOUNTER — Other Ambulatory Visit: Payer: Self-pay | Admitting: Internal Medicine

## 2022-02-27 VITALS — BP 143/59 | HR 61 | Temp 96.2°F | Resp 18 | Wt 217.1 lb

## 2022-02-27 DIAGNOSIS — D509 Iron deficiency anemia, unspecified: Secondary | ICD-10-CM

## 2022-02-27 DIAGNOSIS — K912 Postsurgical malabsorption, not elsewhere classified: Secondary | ICD-10-CM | POA: Insufficient documentation

## 2022-02-27 DIAGNOSIS — D508 Other iron deficiency anemias: Secondary | ICD-10-CM | POA: Insufficient documentation

## 2022-02-27 LAB — FERRITIN: Ferritin: 16 ng/mL — ABNORMAL LOW (ref 24–336)

## 2022-02-27 LAB — CBC WITH DIFFERENTIAL (CANCER CENTER ONLY)
Abs Immature Granulocytes: 0.03 10*3/uL (ref 0.00–0.07)
Basophils Absolute: 0.1 10*3/uL (ref 0.0–0.1)
Basophils Relative: 1 %
Eosinophils Absolute: 0.3 10*3/uL (ref 0.0–0.5)
Eosinophils Relative: 6 %
HCT: 35.1 % — ABNORMAL LOW (ref 39.0–52.0)
Hemoglobin: 11.7 g/dL — ABNORMAL LOW (ref 13.0–17.0)
Immature Granulocytes: 1 %
Lymphocytes Relative: 30 %
Lymphs Abs: 1.4 10*3/uL (ref 0.7–4.0)
MCH: 31.1 pg (ref 26.0–34.0)
MCHC: 33.3 g/dL (ref 30.0–36.0)
MCV: 93.4 fL (ref 80.0–100.0)
Monocytes Absolute: 0.5 10*3/uL (ref 0.1–1.0)
Monocytes Relative: 11 %
Neutro Abs: 2.4 10*3/uL (ref 1.7–7.7)
Neutrophils Relative %: 51 %
Platelet Count: 199 10*3/uL (ref 150–400)
RBC: 3.76 MIL/uL — ABNORMAL LOW (ref 4.22–5.81)
RDW: 12.6 % (ref 11.5–15.5)
WBC Count: 4.7 10*3/uL (ref 4.0–10.5)
nRBC: 0 % (ref 0.0–0.2)

## 2022-02-27 LAB — IRON AND IRON BINDING CAPACITY (CC-WL,HP ONLY)
Iron: 83 ug/dL (ref 45–182)
Saturation Ratios: 23 % (ref 17.9–39.5)
TIBC: 367 ug/dL (ref 250–450)
UIBC: 284 ug/dL (ref 117–376)

## 2022-02-27 NOTE — Progress Notes (Signed)
?    [facility] ?[phone]   [phone] ? ?OFFICE PROGRESS NOTE ? ?[name], MD ?[place] ?[place] [address] ? ?DIAGNOSIS: Iron deficiency anemia secondary to malabsorption status post bypass gastrojejunostomy in March 2022. ? ?PRIOR THERAPY: Iron infusion with Venofer 300 mg IV weekly for 3 weeks. ? ?CURRENT THERAPY: Over-the-counter oral iron tablet with vitamin C twice daily. ? ?INTERVAL HISTORY: ?Jeremy [name] 61 y.o. male returns to the clinic today for follow-up visit.  The patient is feeling fine today with no concerning complaints except for not having enough sleep even he sleeps more than 7 hours during the nighttime.  He denied having any chest pain, shortness of breath, cough or hemoptysis.  He denied having any fever or chills.  He has no nausea, vomiting, diarrhea or constipation.  He has no headache or visual changes.  He has no recent weight loss or night sweats.  He is here today for evaluation and repeat CBC, iron study and ferritin. ? ? ?MEDICAL HISTORY: ?Past Medical History:  ?Diagnosis Date  ? Diabetes mellitus without complication ([place])   ? GERD (gastroesophageal reflux disease)   ? Hyperlipidemia   ? Hypertension   ? IDA (iron deficiency anemia)   ? Rash of entire body 03/2016  ? Sleep apnea   ? ? ?ALLERGIES:  is allergic to cymbalta [duloxetine hcl], doxycycline, other, and robaxin [methocarbamol]. ? ?MEDICATIONS:  ?Current Outpatient Medications  ?Medication Sig Dispense Refill  ? acetaminophen (TYLENOL) 500 MG tablet Take 1,000 mg by mouth in the morning and at bedtime.     ? Blood Glucose Monitoring Suppl (TRUE METRIX METER) w/Device KIT 1 each by Does not apply route 4 (four) times daily -  before meals and at bedtime. 1 kit 0  ? cyclobenzaprine (FLEXERIL) 10 MG tablet Take 1 tablet (10 mg total) by mouth 2 (two) times daily as needed for muscle spasms. 60 tablet 3  ? ferrous sulfate (FEROSUL) 325 (65 FE) MG tablet Take  1 tablet (325 mg total) by mouth 2 (two) times daily with a meal. 180 tablet 2  ? fluticasone (FLONASE) 50 MCG/ACT nasal spray Place 1 spray into both nostrils daily as needed for allergies or rhinitis. (Patient taking differently: Place 1 spray into both nostrils at bedtime.) 16 g 0  ? glipiZIDE (GLUCOTROL) 5 MG tablet TAKE 1 TABLET BY MOUTH IN THE MORNINGS AND 1 TABLET IN THE EVENINGS. 60 tablet 2  ? glucose blood test strip Use as instructed 100 each 12  ? lisinopril (ZESTRIL) 40 MG tablet Take 0.5 tablets (20 mg total) by mouth in the morning and at bedtime. 90 tablet 3  ? loperamide (IMODIUM A-D) 2 MG tablet Take 2 mg by mouth 4 (four) times daily as needed for diarrhea or loose stools.    ? loratadine (CLARITIN) 10 MG tablet Take 10 mg by mouth daily.    ? Magnesium Oxide 200 MG TABS Take 200 mg by mouth daily.    ? naproxen sodium (ALEVE) 220 MG tablet Take 440 mg by mouth in the morning and at bedtime.    ? ondansetron (ZOFRAN) 8 MG tablet Take 1 tablet (8 mg total) by mouth daily as needed for nausea or vomiting. 30 tablet 0  ? pantoprazole (PROTONIX) 40 MG tablet TAKE 1 TABLET (40 MG TOTAL) BY MOUTH 2 (TWO) TIMES DAILY BEFORE A MEAL. (Patient taking differently: Take 40 mg by mouth daily.) 60 tablet 3  ?  Potassium 99 MG TABS Take 99 mg by mouth 2 (two) times daily.    ? psyllium (METAMUCIL) 58.6 % powder Take 1 packet by mouth daily.    ? rOPINIRole (REQUIP) 0.5 MG tablet TAKE 1 TABLET (0.5 MG TOTAL) BY MOUTH AT BEDTIME. 90 tablet 1  ? sildenafil (VIAGRA) 50 MG tablet Take 50 mg by mouth daily as needed for erectile dysfunction.    ? sodium chloride 1 g tablet Take 2 tablets (2 g total) by mouth daily. (Patient taking differently: Take 1 g by mouth at bedtime.) 180 tablet 3  ? tamsulosin (FLOMAX) 0.4 MG CAPS capsule Take 1 capsule (0.4 mg total) by mouth at bedtime. 90 capsule 3  ? traMADol (ULTRAM) 50 MG tablet Take 1 tablet (50 mg total) by mouth every 12 (twelve) hours as needed (Patient taking  differently: Take 50 mg by mouth 2 (two) times daily.) 60 tablet 2  ? vitamin B-12 (CYANOCOBALAMIN) 1000 MCG tablet Take 1,000 mcg by mouth daily.    ? ?No current facility-administered medications for this visit.  ? ? ?SURGICAL HISTORY:  ?Past Surgical History:  ?Procedure Laterality Date  ? [place]  ? lumbar  ? BIOPSY  02/07/2019  ? Procedure: BIOPSY;  Surgeon: Danie Binder, MD;  [facility];  Service: Endoscopy;;  ? BIOPSY  01/30/2022  ? Procedure: BIOPSY;  Surgeon: Eloise Harman, DO;  [facility];  Service: Endoscopy;;  ? CERVICAL SPINE SURGERY  2000  ? COLONOSCOPY N/A 02/07/2019  ? Dr. Oneida Alar: External hemorrhoids next colonoscopy in 10 years  ? ESOPHAGOGASTRODUODENOSCOPY N/A 02/07/2019  ? Dr. Oneida Alar: Barrett's esophagus without dysplasia chronic inactive gastritis but no H. 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 ESOPHAGOGASTRODUODENOSCOPY N/A 01/05/2020  ? Procedure: ESOPHAGOGASTRODUODENOSCOPY (EGD);  Surgeon: Danie Binder, MD;  [facility];  Service: Endoscopy;  Laterality: N/A;  10:30am  ? ESOPHAGOGASTRODUODENOSCOPY (EGD) WITH PROPOFOL N/A 12/06/2020  ? Procedure: ESOPHAGOGASTRODUODENOSCOPY (EGD) WITH PROPOFOL;  Surgeon: Eloise Harman, DO;  [facility];  Service: Endoscopy;  Laterality: N/A;  11:45am  ? ESOPHAGOGASTRODUODENOSCOPY (EGD) WITH PROPOFOL N/A 12/22/2021  ? Procedure: ESOPHAGOGASTRODUODENOSCOPY (EGD) WITH PROPOFOL;  Surgeon: Eloise Harman, DO;  [facility];  Service: Endoscopy;  Laterality: N/A;  10:45am, ASA 3  ? ESOPHAGOGASTRODUODENOSCOPY (EGD) WITH PROPOFOL N/A 01/30/2022  ? Procedure: ESOPHAGOGASTRODUODENOSCOPY (EGD) WITH PROPOFOL;  Surgeon: Eloise Harman, DO;  [facility];  Service: Endoscopy;  Laterality: N/A;  11:00am  ? GASTROJEJUNOSTOMY N/A 01/03/2021  ? Procedure: GASTROJEJUNOSTOMY;  Surgeon: Aviva Signs, MD;  Location: [facility];  Service: General;  Laterality: N/A;  ? HAND SURGERY    ? SAVORY DILATION N/A 01/05/2020  ? Procedure: SAVORY DILATION;  Surgeon: Danie Binder, MD;  [facility];  Service: Endoscopy;  Laterality: N/A;  ? ? ?REVIEW OF SYSTEMS:  A comprehensive review of systems was negative.  ? ?PHYSICAL EXAMINATION: General appearance: alert, cooperative, and no distress ?Head: Normocephalic, without obvious abnormality, atraumatic ?Neck: no adenopathy, no JVD, supple, symmetrical, trachea midline, and thyroid not enlarged, symmetric, no tenderness/mass/nodules ?Lymph nodes: Cervical, supraclavicular, and axillary nodes normal. ?Resp: clear to auscultation bilaterally ?Back: symmetric, no curvature. ROM normal. No CVA tenderness. ?Cardio: regular rate and rhythm, S1, S2 normal, no murmur, click, rub or gallop ?GI: soft, non-tender; bowel sounds normal; no masses,  no organomegaly ?Extremities: extremities normal, atraumatic,  no cyanosis or edema ? ?ECOG PERFORMANCE STATUS: 1 - Symptomatic but completely ambulatory ? ?Blood pressure (!) 143/59, pulse 61, temperature (!) 96.2 ?F (35.7 ?C), temperature source Tympanic, resp. rate 18, weight 217 lb 2 oz (98.5 kg), SpO2 99 %. ? ?LABORATORY DATA: ?Lab Results  ?Component Value Date  ? WBC 4.7 02/27/2022  ? HGB 11.7 (L) 02/27/2022  ? HCT 35.1 (L) 02/27/2022  ? MCV 93.4 02/27/2022  ? PLT 199 02/27/2022  ? ? ?  Chemistry   ?   ?Component Value Date/Time  ? NA 139 02/14/2022 1016  ? NA 138 05/17/2021 1657  ? K 3.9 02/14/2022 1016  ? CL 105 02/14/2022 1016  ? CO2 27 02/14/2022 1016  ? BUN 13 02/14/2022 1016  ? BUN 11 05/17/2021 1657  ? CREATININE 0.86 02/14/2022 1016  ? CREATININE 0.72 10/17/2016 1012  ?    ?Component Value Date/Time  ? CALCIUM 8.9 02/14/2022 1016  ? ALKPHOS 87 03/18/2021 0945  ? AST 18 03/18/2021 0945  ? ALT 16 03/18/2021 0945  ? BILITOT 0.3 03/18/2021 0945  ?  ? ? ? ?RADIOGRAPHIC STUDIES: ?No results found. ? ?ASSESSMENT AND PLAN:  This is a very pleasant 61 years old white male with iron deficiency anemia secondary to malabsorption secondary to gastrojejunostomy in March 2022. ?The patient was treated with iron infusion with Venofer 3

## 2022-03-03 ENCOUNTER — Encounter: Payer: Self-pay | Admitting: Physician Assistant

## 2022-03-03 ENCOUNTER — Other Ambulatory Visit: Payer: Self-pay

## 2022-03-03 ENCOUNTER — Encounter: Payer: Self-pay | Admitting: Internal Medicine

## 2022-03-03 ENCOUNTER — Other Ambulatory Visit: Payer: Self-pay | Admitting: Internal Medicine

## 2022-03-03 DIAGNOSIS — J302 Other seasonal allergic rhinitis: Secondary | ICD-10-CM

## 2022-03-03 MED ORDER — FLUTICASONE PROPIONATE 50 MCG/ACT NA SUSP
1.0000 | Freq: Every day | NASAL | 0 refills | Status: DC | PRN
  Filled 2022-03-03: qty 16, 25d supply, fill #0

## 2022-03-03 NOTE — Telephone Encounter (Signed)
Requested Prescriptions  Pending Prescriptions Disp Refills  . fluticasone (FLONASE) 50 MCG/ACT nasal spray 16 g 0    Sig: Place 1 spray into both nostrils daily as needed for allergies or rhinitis.     Ear, Nose, and Throat: Nasal Preparations - Corticosteroids Passed - 03/03/2022  1:00 AM      Passed - Valid encounter within last 12 months    Recent Outpatient Visits          2 weeks ago Bloating symptom   [facility] Jonah Blue B, MD   2 months ago Type 2 diabetes mellitus with obesity [facility])   [place] [facility] Jonah Blue B, MD   6 months ago Type 2 diabetes mellitus with obesity [facility])   [place] [facility] Marcine Matar, MD   9 months ago Type 2 diabetes mellitus with obesity [facility])   [employer] [facility] Marcine Matar, MD   11 months ago Dizziness   [facility] Marcine Matar, MD      Future Appointments            In 2 months Laural Benes Binnie Rail, MD [facility]

## 2022-03-06 ENCOUNTER — Other Ambulatory Visit: Payer: Self-pay

## 2022-03-07 ENCOUNTER — Other Ambulatory Visit: Payer: Self-pay

## 2022-03-07 ENCOUNTER — Encounter: Payer: Self-pay | Admitting: Internal Medicine

## 2022-03-07 ENCOUNTER — Encounter: Payer: Self-pay | Admitting: Physician Assistant

## 2022-03-11 ENCOUNTER — Other Ambulatory Visit: Payer: Self-pay | Admitting: Internal Medicine

## 2022-03-13 MED ORDER — TRAMADOL HCL 50 MG PO TABS
50.0000 mg | ORAL_TABLET | Freq: Two times a day (BID) | ORAL | 2 refills | Status: DC | PRN
  Filled 2022-03-13: qty 60, 30d supply, fill #0
  Filled 2022-04-21: qty 60, 30d supply, fill #1
  Filled 2022-05-22: qty 60, 30d supply, fill #2

## 2022-03-14 ENCOUNTER — Encounter: Payer: Self-pay | Admitting: Internal Medicine

## 2022-03-14 ENCOUNTER — Other Ambulatory Visit (HOSPITAL_COMMUNITY): Payer: Self-pay

## 2022-03-14 ENCOUNTER — Encounter: Payer: Self-pay | Admitting: Physician Assistant

## 2022-03-14 ENCOUNTER — Other Ambulatory Visit: Payer: Self-pay

## 2022-03-20 ENCOUNTER — Other Ambulatory Visit: Payer: Self-pay

## 2022-04-03 ENCOUNTER — Other Ambulatory Visit: Payer: Self-pay | Admitting: Family Medicine

## 2022-04-03 ENCOUNTER — Other Ambulatory Visit: Payer: Self-pay | Admitting: Internal Medicine

## 2022-04-03 ENCOUNTER — Other Ambulatory Visit: Payer: Self-pay

## 2022-04-03 ENCOUNTER — Encounter: Payer: Self-pay | Admitting: Internal Medicine

## 2022-04-03 ENCOUNTER — Encounter: Payer: Self-pay | Admitting: Physician Assistant

## 2022-04-03 MED ORDER — ROPINIROLE HCL 0.5 MG PO TABS
ORAL_TABLET | ORAL | 1 refills | Status: DC
  Filled 2022-04-03: qty 90, 90d supply, fill #0
  Filled 2022-04-09 – 2022-04-14 (×2): qty 30, 30d supply, fill #0
  Filled 2022-05-13: qty 30, 30d supply, fill #1
  Filled 2022-06-11 – 2022-06-19 (×2): qty 30, 30d supply, fill #2
  Filled 2022-07-16: qty 30, 30d supply, fill #3
  Filled 2022-08-20 – 2022-08-30 (×2): qty 30, 30d supply, fill #4
  Filled 2022-09-25 – 2022-10-12 (×2): qty 30, 30d supply, fill #5

## 2022-04-03 MED ORDER — ONDANSETRON HCL 8 MG PO TABS
8.0000 mg | ORAL_TABLET | Freq: Every day | ORAL | 0 refills | Status: DC | PRN
Start: 2022-04-03 — End: 2022-05-08
  Filled 2022-04-03: qty 30, 30d supply, fill #0

## 2022-04-03 MED ORDER — LISINOPRIL 20 MG PO TABS
20.0000 mg | ORAL_TABLET | Freq: Two times a day (BID) | ORAL | 3 refills | Status: DC
  Filled 2022-04-03: qty 60, 30d supply, fill #0
  Filled 2022-05-01: qty 60, 30d supply, fill #1
  Filled 2022-06-11 – 2022-06-19 (×2): qty 60, 30d supply, fill #2
  Filled 2022-07-16: qty 60, 30d supply, fill #3

## 2022-04-03 NOTE — Telephone Encounter (Signed)
Requested Prescriptions  Pending Prescriptions Disp Refills  . ondansetron (ZOFRAN) 8 MG tablet 30 tablet     Sig: Take 1 tablet (8 mg total) by mouth daily as needed for nausea or vomiting.     Not Delegated - Gastroenterology: Antiemetics - ondansetron Failed - 04/03/2022 12:21 AM      Failed - This refill cannot be delegated      Failed - AST in normal range and within 360 days    AST  Date Value Ref Range Status  03/18/2021 18 0 - 40 IU/L Final         Failed - ALT in normal range and within 360 days    ALT  Date Value Ref Range Status  03/18/2021 16 0 - 44 IU/L Final         Passed - Valid encounter within last 6 months    Recent Outpatient Visits          1 month ago Bloating symptom   [place] [facility] Jon[facility] Blue B, MD   3 months ago Type 2 diabetes mellitus with obesity [facility])   [place] [facility] Jonah Blue B, MD   7 months ago Type 2 diabetes mellitus with obesity [facility])   [place] [facility] Jonah Blue B, MD   10 months ago Type 2 diabetes mellitus with obesity [facility])   [place] [facility] Marcine Matar, MD   1 year ago Dizziness   [place] [facility] Marcine Matar, MD      Future Appointments            In 1 month Laural Benes, Binnie Rail, MD [facility]           . rOPINIRole (REQUIP) 0.5 MG tablet 90 tablet 1    Sig: TAKE 1 TABLET (0.5 MG TOTAL) BY MOUTH AT BEDTIME.     Neurology:  Parkinsonian Agents Failed - 04/03/2022 12:21 AM      Failed - Last BP in normal range    BP Readings from Last 1 Encounters:  02/27/22 (!) 143/59         Passed - Last Heart Rate in normal range    Pulse Readings from Last 1 Encounters:  02/27/22 61         Passed - Valid encounter within last 12 months    Recent Outpatient Visits          1 month ago Bloating symptom   [place] [facility] Jonah Blue B, MD   3 months ago Type 2 diabetes mellitus with obesity [facility])   [place] [facility] Jonah Blue B, MD   7 months ago Type 2 diabetes mellitus with obesity [facility])   [place] [facility] Marcine Matar, MD   10 months ago Type 2 diabetes mellitus with obesity [facility])   [place] [facility] Marcine Matar, MD   1 year ago Dizziness   [place] [facility] Marcine Matar, MD      Future Appointments            In 1 month Laural Benes, Binnie Rail, MD [facility]

## 2022-04-03 NOTE — Telephone Encounter (Signed)
Requested medication (s) are due for refill today: yes  Requested medication (s) are on the active medication list:yes  Last refill:  08/22/21 #30  Future visit scheduled: yes  Notes to clinic:  med not delegated to NT to RF   Requested Prescriptions  Pending Prescriptions Disp Refills   ondansetron (ZOFRAN) 8 MG tablet 30 tablet     Sig: Take 1 tablet (8 mg total) by mouth daily as needed for nausea or vomiting.     Not Delegated - Gastroenterology: Antiemetics - ondansetron Failed - 04/03/2022 12:21 AM      Failed - This refill cannot be delegated      Failed - AST in normal range and within 360 days    AST  Date Value Ref Range Status  03/18/2021 18 0 - 40 IU/L Final         Failed - ALT in normal range and within 360 days    ALT  Date Value Ref Range Status  03/18/2021 16 0 - 44 IU/L Final         Passed - Valid encounter within last 6 months    Recent Outpatient Visits           1 month ago Bloating symptom   [place] [facility] Jon[facility] Blue B, MD   3 months ago Type 2 diabetes mellitus with obesity [facility])   [place] [facility] Jonah Blue B, MD   7 months ago Type 2 diabetes mellitus with obesity [facility])   [place] [facility] Jonah Blue B, MD   10 months ago Type 2 diabetes mellitus with obesity [facility])   [place] [facility] Marcine Matar, MD   1 year ago Dizziness   [place] [facility] Marcine Matar, MD       Future Appointments             In 1 month Marcine Matar, MD [facility]            Signed Prescriptions Disp Refills   rOPINIRole (REQUIP) 0.5 MG tablet 90 tablet 1    Sig: TAKE 1 TABLET (0.5 MG TOTAL) BY MOUTH AT BEDTIME.     Neurology:  Parkinsonian Agents Failed - 04/03/2022 12:21 AM      Failed - Last BP in normal range    BP Readings from Last 1 Encounters:  02/27/22 (!)  143/59         Passed - Last Heart Rate in normal range    Pulse Readings from Last 1 Encounters:  02/27/22 61         Passed - Valid encounter within last 12 months    Recent Outpatient Visits           1 month ago Bloating symptom   [place] [facility] Jon[facility] Blue B, MD   3 months ago Type 2 diabetes mellitus with obesity [facility])   [place] [facility] Jonah Blue B, MD   7 months ago Type 2 diabetes mellitus with obesity [facility])   [place] [facility] Marcine Matar, MD   10 months ago Type 2 diabetes mellitus with obesity [facility])   [place] [facility] Marcine Matar, MD   1 year ago Dizziness   [facility] Marcine Matar, MD  Future Appointments             In 1 month Wynetta Emery, Dalbert Batman, MD [place]

## 2022-04-04 ENCOUNTER — Encounter: Payer: Self-pay | Admitting: Physician Assistant

## 2022-04-04 ENCOUNTER — Other Ambulatory Visit: Payer: Self-pay

## 2022-04-04 ENCOUNTER — Encounter: Payer: Self-pay | Admitting: Internal Medicine

## 2022-04-06 ENCOUNTER — Telehealth: Payer: Self-pay | Admitting: Internal Medicine

## 2022-04-06 NOTE — Telephone Encounter (Signed)
Called patient regarding upcoming August appointment, patient has been called and notified.  

## 2022-04-10 ENCOUNTER — Encounter: Payer: Self-pay | Admitting: Internal Medicine

## 2022-04-10 ENCOUNTER — Encounter: Payer: Self-pay | Admitting: Physician Assistant

## 2022-04-10 ENCOUNTER — Other Ambulatory Visit: Payer: Self-pay

## 2022-04-11 ENCOUNTER — Encounter: Payer: Self-pay | Admitting: Internal Medicine

## 2022-04-13 ENCOUNTER — Ambulatory Visit: Payer: Self-pay

## 2022-04-13 NOTE — Telephone Encounter (Signed)
  Chief Complaint: Dizziness, Chest popping on right side, leg cramps Symptoms: ibid Frequency: ongoing Pertinent Negatives: Patient denies Chest pain , sob, weakness Disposition: [] ED /[] Urgent Care (no appt availability in office) / [x] Appointment(In office/virtual)/ []  [place] [facility]/ [] Home Care/ [] Refused Recommended Disposition /[] [place] [facility]/ []  Follow-up with PCP Additional Notes: PT called to make appt. Pt has had an ongoing conversation via MyChart with Dr. . Pt has lightheadedness when standing quickly, a popping sensation on the right side of his chest and leg cramps.  Pt has an appt for July 20th. Unsure from Dr. note if pt should be seen sooner. Reason for Disposition  Dizziness is a chronic symptom (recurrent or ongoing AND present > 4 weeks)  Answer Assessment - Initial Assessment Questions 1. DESCRIPTION: "Describe your dizziness."     Has lightheadedness when getting out of bed or standing 2. LIGHTHEADED: "Do you feel lightheaded?" (e.g., somewhat faint, woozy, weak upon standing)     Faint 3. VERTIGO: "Do you feel like either you or the room is spinning or tilting?" (i.e. vertigo)     no 4. SEVERITY: "How bad is it?"  "Do you feel like you are going to faint?" "Can you stand and walk?"   - MILD: Feels slightly dizzy, but walking normally.   - MODERATE: Feels unsteady when walking, but not falling; interferes with normal activities (e.g., school, work).   - SEVERE: Unable to walk without falling, or requires assistance to walk without falling; feels like passing out now.      mild 5. ONSET:  "When did the dizziness begin?"     ongoing 6. AGGRAVATING FACTORS: "Does anything make it worse?" (e.g., standing, change in head position)      7. HEART RATE: "Can you tell me your heart rate?" "How many beats in 15 seconds?"  (Note: not all patients can do this)        8. CAUSE: "What do you think is causing the dizziness?"     Getting up too  quickly? 9. RECURRENT SYMPTOM: "Have you had dizziness before?" If Yes, ask: "When was the last time?" "What happened that time?"      10. OTHER SYMPTOMS: "Do you have any other symptoms?" (e.g., fever, chest pain, vomiting, diarrhea, bleeding)        11. PREGNANCY: "Is there any chance you are pregnant?" "When was your last menstrual period?"  Protocols used: Dizziness - Lightheadedness-A-AH

## 2022-04-13 NOTE — Telephone Encounter (Signed)
Patient was given first available appointment.

## 2022-04-14 ENCOUNTER — Encounter: Payer: Self-pay | Admitting: Internal Medicine

## 2022-04-14 ENCOUNTER — Other Ambulatory Visit: Payer: Self-pay

## 2022-04-14 ENCOUNTER — Encounter: Payer: Self-pay | Admitting: Physician Assistant

## 2022-04-17 ENCOUNTER — Other Ambulatory Visit: Payer: Self-pay

## 2022-04-21 ENCOUNTER — Encounter: Payer: Self-pay | Admitting: Physician Assistant

## 2022-04-21 ENCOUNTER — Other Ambulatory Visit: Payer: Self-pay

## 2022-04-21 ENCOUNTER — Encounter: Payer: Self-pay | Admitting: Internal Medicine

## 2022-04-26 ENCOUNTER — Encounter: Payer: Self-pay | Admitting: Physician Assistant

## 2022-04-26 ENCOUNTER — Ambulatory Visit (INDEPENDENT_AMBULATORY_CARE_PROVIDER_SITE_OTHER): Payer: Self-pay | Admitting: Physician Assistant

## 2022-04-26 VITALS — BP 130/60 | HR 83 | Ht 70.0 in | Wt 214.0 lb

## 2022-04-26 DIAGNOSIS — R42 Dizziness and giddiness: Secondary | ICD-10-CM

## 2022-04-26 DIAGNOSIS — I25118 Atherosclerotic heart disease of native coronary artery with other forms of angina pectoris: Secondary | ICD-10-CM

## 2022-04-26 DIAGNOSIS — I10 Essential (primary) hypertension: Secondary | ICD-10-CM

## 2022-04-26 DIAGNOSIS — E7800 Pure hypercholesterolemia, unspecified: Secondary | ICD-10-CM

## 2022-04-26 NOTE — Patient Instructions (Signed)
Medication Instructions:   Your physician recommends that you continue on your current medications as directed. Please refer to the Current Medication list given to you today.   *If you need a refill on your cardiac medications before your next appointment, please call your pharmacy*   Lab Work:  None ordered.  If you have labs (blood work) drawn today and your tests are completely normal, you will receive your results only by: MyChart Message (if you have MyChart) OR A paper copy in the mail If you have any lab test that is abnormal or we need to change your treatment, we will call you to review the results.   Testing/Procedures:  None Ordered.   Follow-Up: At [facility], you and your health needs are our priority.  As part of our continuing mission to provide you with exceptional heart care, we have created designated Provider Care Teams.  These Care Teams include your primary Cardiologist (physician) and Advanced Practice Providers (APPs -  Physician Assistants and Nurse Practitioners) who all work together to provide you with the care you need, when you need it.  We recommend signing up for the patient portal called "MyChart".  Sign up information is provided on this After Visit Summary.  MyChart is used to connect with patients for Virtual Visits (Telemedicine).  Patients are able to view lab/test results, encounter notes, upcoming appointments, etc.  Non-urgent messages can be sent to your provider as well.   To learn more about what you can do with MyChart, go to [email or web address].    Your next appointment:   6 month(s)  The format for your next appointment:   In Person  Provider:   Meriam Sprague, MD     Other Instructions  Your physician wants you to follow-up in: 6 months with Dr. Shari Prows.  You will receive a reminder letter in the mail two months in advance. If you don't receive a letter, please call our office to schedule the follow-up  appointment.   Important Information About Sugar

## 2022-04-26 NOTE — Progress Notes (Signed)
Cardiology Office Note:    Date:  04/26/2022   ID:  Jeremy [name], DOB 10/24/1960, MRN [id number]  PCP:  Johnson, Deborah B, MD  [facility] Cardiologist:  Heather E Pemberton, MD  [facility] Electrophysiologist:  None   Chief Complaint: yearly follow up   History of Present Illness:    Jeremy [name] is a 60 y.o. 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   The patient was initially referred by hs PCP on 09/20/20 for multivessel, extensive coronary calcification noted on CT abdomen/pelvis. He was also complaining of exertional chest discomfort and SOB at that time that was relieved with rest. Symptoms were particularly worse when walking an incline. During that visit, we recommended coronary angiography but he had to re-schedule due to feeling sick with congestion, fevers and cough. He did not reschedule   He then was seen in clinic on 11/12/20 where he stated that his chest pain was occurring constantly and not exertional related. Stated it felt like reflux and improved somewhat with protonix, however, he can only take protonix 2x/week because it makes him feel bloated.. Notably he had been seen by GI in the past where he was diagnosed with "narrowings" in his duodenum one of which has been dilated but he has another one which was unable to be intervened up. Given the change in the quality of his pain, the patient preferred to undergo stress testing over cath. We obtained a myoview on 11/23/20 which was normal with no ischemia or infarction. TTE 11/11/20: LVEF 60-65%, moderate LVH, no significant valvular disease.   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 He now returns to clinic for  follow-up.  Last seen by Dr. Pemberton 02/2021.  Here today for follow up.  Patient works as handyman.  He reports sometimes needs to work on his knee on the floor and then when he tried to stand up he feels dizzy.  He needs to get his balance.  This is likely due to pressure on his abdomen given prior GI surgery.  No orthostatic type symptoms while standing from sitting position.  He denies chest pain, shortness of breath, orthopnea, PND, syncope, lower extremity edema or melena.  Reports compliance with medication.  Past Medical History:  Diagnosis Date   Diabetes mellitus without complication (HCC)    GERD (gastroesophageal reflux disease)    Hyperlipidemia    Hypertension    IDA (iron deficiency anemia)    Rash of entire body 03/2016   Sleep apnea     Past Surgical History:  Procedure Laterality Date   BACK SURGERY  1993   lumbar   BIOPSY  02/07/2019   Procedure: BIOPSY;  Surgeon: Fields, Sandi L, MD;  [facility];  Service: Endoscopy;;   BIOPSY  01/30/2022   Procedure: BIOPSY;  Surgeon: Carver, Charles K, DO;  [facility];  Service: Endoscopy;;   CERVICAL SPINE SURGERY  2000   COLONOSCOPY N/A 02/07/2019   Dr. Fields: External hemorrhoids next colonoscopy in 10 years   ESOPHAGOGASTRODUODENOSCOPY N/A 02/07/2019   Dr. Fields: Barrett's esophagus without dysplasia chronic inactive gastritis but no H. 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, nonbleeding duodenal   diverticulum,   ESOPHAGOGASTRODUODENOSCOPY N/A 01/05/2020   Procedure: ESOPHAGOGASTRODUODENOSCOPY (EGD);  Surgeon: Danie Binder, MD;  [facility];  Service: Endoscopy;  Laterality: N/A;  10:30am   ESOPHAGOGASTRODUODENOSCOPY (EGD) WITH PROPOFOL N/A 12/06/2020   Procedure: ESOPHAGOGASTRODUODENOSCOPY (EGD) WITH PROPOFOL;  Surgeon: Eloise Harman, DO;  [facility];  Service: Endoscopy;  Laterality: N/A;  11:45am    ESOPHAGOGASTRODUODENOSCOPY (EGD) WITH PROPOFOL N/A 12/22/2021   Procedure: ESOPHAGOGASTRODUODENOSCOPY (EGD) WITH PROPOFOL;  Surgeon: Eloise Harman, DO;  [facility];  Service: Endoscopy;  Laterality: N/A;  10:45am, ASA 3   ESOPHAGOGASTRODUODENOSCOPY (EGD) WITH PROPOFOL N/A 01/30/2022   Procedure: ESOPHAGOGASTRODUODENOSCOPY (EGD) WITH PROPOFOL;  Surgeon: Eloise Harman, DO;  [facility];  Service: Endoscopy;  Laterality: N/A;  11:00am   GASTROJEJUNOSTOMY N/A 01/03/2021   Procedure: GASTROJEJUNOSTOMY;  Surgeon: Aviva Signs, MD;  Location: [facility];  Service: General;  Laterality: N/A;   HAND SURGERY     SAVORY DILATION N/A 01/05/2020   Procedure: SAVORY DILATION;  Surgeon: Danie Binder, MD;  [facility];  Service: Endoscopy;  Laterality: N/A;    Current Medications: Current Meds  Medication Sig   acetaminophen (TYLENOL) 500 MG tablet Take 1,000 mg by mouth in the morning and at bedtime.    Blood Glucose Monitoring Suppl (TRUE METRIX METER) w/Device KIT 1 each by Does not apply route 4 (four) times daily -  before meals and at bedtime.   cyclobenzaprine (FLEXERIL) 10 MG tablet Take 1 tablet (10 mg total) by mouth 2 (two) times daily as needed for muscle spasms.   ferrous sulfate (FEROSUL) 325 (65 FE) MG tablet Take 1 tablet (325 mg total) by mouth 2 (two) times daily with a meal.   fluticasone (FLONASE) 50 MCG/ACT nasal spray Place 1 spray into both nostrils daily as needed for allergies or rhinitis.   glipiZIDE (GLUCOTROL) 5 MG tablet TAKE 1 TABLET BY MOUTH IN THE MORNINGS AND 1 TABLET IN THE EVENINGS.   glucose blood test strip Use as instructed   lisinopril (ZESTRIL) 20 MG tablet Take 1 tablet (20 mg total) by mouth in the morning and at bedtime.   loperamide (IMODIUM A-D) 2 MG tablet Take 2 mg by mouth 4 (four) times daily as needed for diarrhea or loose stools.   loratadine (CLARITIN) 10 MG tablet Take 10 mg by mouth daily.   Magnesium Oxide 200 MG  TABS Take 200 mg by mouth in the morning and at bedtime.   naproxen sodium (ALEVE) 220 MG tablet Take 440 mg by mouth in the morning and at bedtime.   ondansetron (ZOFRAN) 8 MG tablet Take 1 tablet (8 mg total) by mouth daily as needed for nausea or vomiting.   pantoprazole (PROTONIX) 40 MG tablet TAKE 1 TABLET (40 MG TOTAL) BY MOUTH 2 (TWO) TIMES DAILY BEFORE A MEAL. (Patient taking differently: Take 40 mg by mouth every other day.)   Potassium 99 MG TABS Take 99 mg by mouth 2 (two) times daily.   rOPINIRole (REQUIP) 0.5 MG tablet TAKE 1 TABLET (0.5 MG TOTAL) BY MOUTH AT BEDTIME.   sildenafil (VIAGRA) 50 MG tablet Take 50 mg by mouth daily as needed for erectile dysfunction.   tamsulosin (FLOMAX) 0.4 MG CAPS capsule Take 1 capsule (0.4 mg total) by mouth at bedtime.   traMADol (ULTRAM) 50 MG tablet Take 1 tablet (50 mg total) by mouth every 12 (twelve) hours as needed   vitamin B-12 (CYANOCOBALAMIN) 1000 MCG tablet Take  1,000 mcg by mouth daily.   [DISCONTINUED] psyllium (METAMUCIL) 58.6 % powder Take 1 packet by mouth daily.   [DISCONTINUED] sodium chloride 1 g tablet Take 2 tablets (2 g total) by mouth daily. (Patient taking differently: Take 1 g by mouth at bedtime.)     Allergies:   Cymbalta [duloxetine hcl], Doxycycline, Other, and Robaxin [methocarbamol]   Social History   Socioeconomic History   Marital status: Significant Other    Spouse name: Not on file   Number of children: Not on file   Years of education: Not on file   Highest education level: Not on file  Occupational History   Occupation: Clinical biochemist  Tobacco Use   Smoking status: Former    Types: Cigarettes    Quit date: 10/16/1988    Years since quitting: 33.5   Smokeless tobacco: Never  Vaping Use   Vaping Use: Never used  Substance and Sexual Activity   Alcohol use: No   Drug use: Yes    Comment: rare   Sexual activity: Not on file  Other Topics Concern   Not on file  Social History Narrative    Volunteers as Airline pilot   Social Determinants of Health   Financial Resource Strain: Not on file  Food Insecurity: Not on file  Transportation Needs: Not on file  Physical Activity: Not on file  Stress: Not on file  Social Connections: Not on file     Family History: The patient's family history includes Breast cancer in his mother; CAD in his father; Colon cancer in his paternal grandfather; Pancreatic cancer in his mother.    ROS:   Please see the history of present illness.    All other systems reviewed and are negative.   EKGs/Labs/Other Studies Reviewed:    The following studies were reviewed today: Myoview 11/23/20 The following studies were reviewed today: Nuclear stress EF: 61%. There was no ST segment deviation noted during stress. No T wave inversion was noted during stress. The study is normal. This is a low risk study. The left ventricular ejection fraction is normal (55-65%).   TTE 11/11/20: IMPRESSIONS   1. Left ventricular ejection fraction, by estimation, is 60 to 65%. The  left ventricle has normal function. The left ventricle has no regional  wall motion abnormalities. There is moderate left ventricular hypertrophy.  Left ventricular diastolic  parameters were normal.   2. Right ventricular systolic function is normal. The right ventricular  size is normal. Tricuspid regurgitation signal is inadequate for assessing  PA pressure.   3. The mitral valve is normal in structure. Trivial mitral valve  regurgitation. No evidence of mitral stenosis.   4. The aortic valve is tricuspid. Aortic valve regurgitation is trivial.  No aortic stenosis is present.   5. The inferior vena cava is normal in size with greater than 50%  respiratory variability, suggesting right atrial pressure of 3 mmHg.  EKG:  EKG is  ordered today.  The ekg ordered today demonstrates sinus rhythm, J-point elevation (same as previous)  Recent Labs: 08/25/2021: Pro B Natriuretic  peptide (BNP) 12.0 02/14/2022: BUN 13; Creatinine, Ser 0.86; Potassium 3.9; Sodium 139 02/27/2022: Hemoglobin 11.7; Platelet Count 199  Recent Lipid Panel    Component Value Date/Time   CHOL 150 02/05/2020 1442   TRIG 168 (H) 02/05/2020 1442   HDL 40 02/05/2020 1442   CHOLHDL 3.8 02/05/2020 1442   CHOLHDL 4.8 10/17/2016 1012   VLDL NOT CALC 10/17/2016 1012   LDLCALC 81 02/05/2020 1442  Physical Exam:    VS:  BP 130/60   Pulse 83   Ht 5' 10" (1.778 m)   Wt 214 lb (97.1 kg)   SpO2 97%   BMI 30.71 kg/m     Wt Readings from Last 3 Encounters:  04/26/22 214 lb (97.1 kg)  02/27/22 217 lb 2 oz (98.5 kg)  02/17/22 221 lb 9.6 oz (100.5 kg)     GEN:  Well nourished, well developed in no acute distress HEENT: Normal NECK: No JVD; No carotid bruits LYMPHATICS: No lymphadenopathy CARDIAC: RRR, no murmurs, rubs, gallops RESPIRATORY:  Clear to auscultation without rales, wheezing or rhonchi  ABDOMEN: Soft, non-tender, non-distended MUSCULOSKELETAL:  No edema; No deformity  SKIN: Warm and dry NEUROLOGIC:  Alert and oriented x 3 PSYCHIATRIC:  Normal affect   ASSESSMENT AND PLAN:    CAD on CT of chest  - negative myoview 11/2020.  No anginal symptoms.  EKG without acute changes.  He was on aspirin and Lipitor 80 mg when last seen by Dr. Pemberton however currently not.  He does not remember reason. Does not want to restart.   2. HTN -Blood pressure stable on lisinopril  3.HLD -No results found for requested labs within last 365 days.  - He will follow up with PCP.   4.  Dizziness Likely due to abdominal pressure while bending. Also reporting ringing in ear. Follow up with PCP next week.  - Not orthostatic in clinic.   Medication Adjustments/Labs and Tests Ordered: Current medicines are reviewed at length with the patient today.  Concerns regarding medicines are outlined above.  Orders Placed This Encounter  Procedures   EKG 12-Lead   No orders of the defined types were  placed in this encounter.   Patient Instructions  Medication Instructions:   Your physician recommends that you continue on your current medications as directed. Please refer to the Current Medication list given to you today.   *If you need a refill on your cardiac medications before your next appointment, please call your pharmacy*   Lab Work:  None ordered.  If you have labs (blood work) drawn today and your tests are completely normal, you will receive your results only by: MyChart Message (if you have MyChart) OR A paper copy in the mail If you have any lab test that is abnormal or we need to change your treatment, we will call you to review the results.   Testing/Procedures:  None Ordered.   Follow-Up: At [facility], you and your health needs are our priority.  As part of our continuing mission to provide you with exceptional heart care, we have created designated Provider Care Teams.  These Care Teams include your primary Cardiologist (physician) and Advanced Practice Providers (APPs -  Physician Assistants and Nurse Practitioners) who all work together to provide you with the care you need, when you need it.  We recommend signing up for the patient portal called "MyChart".  Sign up information is provided on this After Visit Summary.  MyChart is used to connect with patients for Virtual Visits (Telemedicine).  Patients are able to view lab/test results, encounter notes, upcoming appointments, etc.  Non-urgent messages can be sent to your provider as well.   To learn more about what you can do with MyChart, go to [email or web address].    Your next appointment:   6 month(s)  The format for your next appointment:   In Person  Provider:   Heather E Pemberton, MD       Other Instructions  Your physician wants you to follow-up in: 6 months with Dr. Pemberton.  You will receive a reminder letter in the mail two months in advance. If you don't receive a letter, please  call our office to schedule the follow-up appointment.   Important Information About Sugar         Signed,  , PA  04/26/2022 3:15 PM    [facility] 

## 2022-05-01 ENCOUNTER — Other Ambulatory Visit: Payer: Self-pay

## 2022-05-01 ENCOUNTER — Encounter: Payer: Self-pay | Admitting: Physician Assistant

## 2022-05-01 ENCOUNTER — Encounter: Payer: Self-pay | Admitting: Internal Medicine

## 2022-05-04 ENCOUNTER — Encounter: Payer: Self-pay | Admitting: Internal Medicine

## 2022-05-04 ENCOUNTER — Ambulatory Visit (HOSPITAL_BASED_OUTPATIENT_CLINIC_OR_DEPARTMENT_OTHER): Payer: Self-pay | Admitting: Internal Medicine

## 2022-05-04 ENCOUNTER — Other Ambulatory Visit: Payer: Self-pay

## 2022-05-04 VITALS — BP 124/73 | HR 69 | Wt 214.4 lb

## 2022-05-04 DIAGNOSIS — Z5321 Procedure and treatment not carried out due to patient leaving prior to being seen by health care provider: Secondary | ICD-10-CM

## 2022-05-04 NOTE — Progress Notes (Signed)
  Patient left before being seen by me.  I will have CMA reach out to him to see if he would like to reschedule.

## 2022-05-08 ENCOUNTER — Encounter: Payer: Self-pay | Admitting: Physician Assistant

## 2022-05-08 ENCOUNTER — Other Ambulatory Visit: Payer: Self-pay | Admitting: Internal Medicine

## 2022-05-08 ENCOUNTER — Other Ambulatory Visit: Payer: Self-pay

## 2022-05-08 ENCOUNTER — Encounter: Payer: Self-pay | Admitting: Internal Medicine

## 2022-05-09 MED ORDER — ONDANSETRON HCL 8 MG PO TABS
8.0000 mg | ORAL_TABLET | Freq: Every day | ORAL | 2 refills | Status: DC | PRN
  Filled 2022-05-09: qty 30, 30d supply, fill #0
  Filled 2023-04-04: qty 30, 30d supply, fill #1

## 2022-05-09 NOTE — Telephone Encounter (Signed)
Requested medication (s) are due for refill today - yes  Requested medication (s) are on the active medication list -yes  Future visit scheduled -no  Last refill: 04/03/22 #30  Notes to clinic: non delegated Rx  Requested Prescriptions  Pending Prescriptions Disp Refills   ondansetron (ZOFRAN) 8 MG tablet 30 tablet 0    Sig: Take 1 tablet (8 mg total) by mouth daily as needed for nausea or vomiting.     Not Delegated - Gastroenterology: Antiemetics - ondansetron Failed - 05/08/2022 12:53 AM      Failed - This refill cannot be delegated      Failed - AST in normal range and within 360 days    AST  Date Value Ref Range Status  03/18/2021 18 0 - 40 IU/L Final         Failed - ALT in normal range and within 360 days    ALT  Date Value Ref Range Status  03/18/2021 16 0 - 44 IU/L Final         Passed - Valid encounter within last 6 months    Recent Outpatient Visits           5 days ago Patient left before evaluation by physician   [facility] Marcine Matar, MD   2 months ago Bloating symptom   [facility] Jonah Blue B, MD   4 months ago Type 2 diabetes mellitus with obesity [facility])   [place] [facility] Jonah Blue B, MD   8 months ago Type 2 diabetes mellitus with obesity [facility])   [facility] Marcine Matar, MD   11 months ago Type 2 diabetes mellitus with obesity [facility])   [place] [facility] Marcine Matar, MD                 Requested Prescriptions  Pending Prescriptions Disp Refills   ondansetron (ZOFRAN) 8 MG tablet 30 tablet 0    Sig: Take 1 tablet (8 mg total) by mouth daily as needed for nausea or vomiting.     Not Delegated - Gastroenterology: Antiemetics - ondansetron Failed - 05/08/2022 12:53 AM      Failed - This refill cannot be delegated      Failed - AST in normal range and within 360 days     AST  Date Value Ref Range Status  03/18/2021 18 0 - 40 IU/L Final         Failed - ALT in normal range and within 360 days    ALT  Date Value Ref Range Status  03/18/2021 16 0 - 44 IU/L Final         Passed - Valid encounter within last 6 months    Recent Outpatient Visits           5 days ago Patient left before evaluation by physician   [facility] Marcine Matar, MD   2 months ago Bloating symptom   [facility] Jonah Blue B, MD   4 months ago Type 2 diabetes mellitus with obesity [facility])   [place] Jonah Blue B, MD   8 months ago Type 2 diabetes mellitus with obesity [facility])    [facility] Jonah Blue B, MD   11 months ago Type 2 diabetes mellitus with obesity (HCC)  [facility] Marcine Matar, MD

## 2022-05-10 ENCOUNTER — Other Ambulatory Visit: Payer: Self-pay

## 2022-05-10 ENCOUNTER — Encounter: Payer: Self-pay | Admitting: Internal Medicine

## 2022-05-10 ENCOUNTER — Encounter: Payer: Self-pay | Admitting: Physician Assistant

## 2022-05-12 ENCOUNTER — Telehealth: Payer: Self-pay

## 2022-05-12 NOTE — Telephone Encounter (Signed)
Called and scheduled appt

## 2022-05-13 ENCOUNTER — Other Ambulatory Visit: Payer: Self-pay | Admitting: Internal Medicine

## 2022-05-13 DIAGNOSIS — E119 Type 2 diabetes mellitus without complications: Secondary | ICD-10-CM

## 2022-05-15 ENCOUNTER — Encounter: Payer: Self-pay | Admitting: Physician Assistant

## 2022-05-15 ENCOUNTER — Other Ambulatory Visit: Payer: Self-pay

## 2022-05-15 ENCOUNTER — Encounter: Payer: Self-pay | Admitting: Internal Medicine

## 2022-05-15 ENCOUNTER — Ambulatory Visit: Payer: Self-pay | Attending: Internal Medicine | Admitting: Internal Medicine

## 2022-05-15 DIAGNOSIS — I152 Hypertension secondary to endocrine disorders: Secondary | ICD-10-CM

## 2022-05-15 DIAGNOSIS — E1169 Type 2 diabetes mellitus with other specified complication: Secondary | ICD-10-CM

## 2022-05-15 DIAGNOSIS — E785 Hyperlipidemia, unspecified: Secondary | ICD-10-CM

## 2022-05-15 DIAGNOSIS — E669 Obesity, unspecified: Secondary | ICD-10-CM

## 2022-05-15 DIAGNOSIS — E119 Type 2 diabetes mellitus without complications: Secondary | ICD-10-CM

## 2022-05-15 DIAGNOSIS — R42 Dizziness and giddiness: Secondary | ICD-10-CM

## 2022-05-15 DIAGNOSIS — E1159 Type 2 diabetes mellitus with other circulatory complications: Secondary | ICD-10-CM

## 2022-05-15 DIAGNOSIS — Z23 Encounter for immunization: Secondary | ICD-10-CM

## 2022-05-15 MED ORDER — GLIPIZIDE 5 MG PO TABS
ORAL_TABLET | ORAL | 1 refills | Status: DC
  Filled 2022-05-15: qty 180, 90d supply, fill #0
  Filled 2022-08-20 – 2022-08-30 (×2): qty 180, 90d supply, fill #1

## 2022-05-15 NOTE — Progress Notes (Signed)
Patient ID: Jeremy [name], male   DOB: Dec 04, 1960, 61 y.o.   MRN: [id number] Virtual Visit via Telephone Note  I connected with Jeremy [name] on 05/15/2022 at 8:15 a.m by telephone and verified that I am speaking with the correct person using two identifiers  Location: Patient: home Provider: office  Participants: Myself Patient   I discussed the limitations, risks, security and privacy concerns of performing an evaluation and management service by telephone and the availability of in person appointments. I also discussed with the patient that there may be a patient responsible charge related to this service. The patient expressed understanding and agreed to proceed.   History of Present Illness: Pt with hx of DM, HL, HTN,GERD, IDA (Barrett's esophagus and moderate gastritis thought to be NSAID induced on EGD; iron infusions by Dr. 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  Myoview neg 02/2021   Pt reports he was having some light headedness about 2 wks ago that occurred when he would get up off his knees to stand up.  He would be working pending on his needs.  It resolved with stopping Flomax.  He denies any problems passing his urine since being off Flomax.  However he does not want it removed from his med list because he will keep it on hand to use if he develops any problems passing his urine.  DM: still taking Glipizide 5 mg BID. Not checking recently but was checking it when he was feeling dizziness and range was 117-120 HTN: checks BP.  Reports range SBP 113-148 with the 148 being an outlier.  DBP 72-80 Limits salt in foods.  Still taking Lisinopril 20 mg BID HL: stopped taking statin over 1 yr.  Does not recall any S.E from the med but had read so many S.E from the med  HM: Due for Shingrix vaccine.  Due for diabetic eye exam.  No insurance to  afford the eye exam. Outpatient Encounter Medications as of 05/15/2022  Medication Sig   acetaminophen (TYLENOL) 500 MG tablet Take 1,000 mg by mouth in the morning and at bedtime.    Blood Glucose Monitoring Suppl (TRUE METRIX METER) w/Device KIT 1 each by Does not apply route 4 (four) times daily -  before meals and at bedtime.   cyclobenzaprine (FLEXERIL) 10 MG tablet Take 1 tablet (10 mg total) by mouth 2 (two) times daily as needed for muscle spasms.   ferrous sulfate (FEROSUL) 325 (65 FE) MG tablet Take 1 tablet (325 mg total) by mouth 2 (two) times daily with a meal.   fluticasone (FLONASE) 50 MCG/ACT nasal spray Place 1 spray into both nostrils daily as needed for allergies or rhinitis.   glipiZIDE (GLUCOTROL) 5 MG tablet TAKE 1 TABLET BY MOUTH IN THE MORNINGS AND 1 TABLET IN THE EVENINGS.   glucose blood test strip Use as instructed   lisinopril (ZESTRIL) 20 MG tablet Take 1 tablet (20 mg total) by mouth in the morning and at bedtime.   loperamide (IMODIUM A-D) 2 MG tablet Take 2 mg by mouth 4 (four) times daily as needed for diarrhea or loose stools.   loratadine (CLARITIN) 10 MG tablet Take 10 mg by mouth daily.   Magnesium Oxide 200 MG TABS Take 200 mg by mouth in the morning and at bedtime.   naproxen sodium (ALEVE) 220 MG tablet Take 440 mg by  mouth in the morning and at bedtime.   ondansetron (ZOFRAN) 8 MG tablet Take 1 tablet (8 mg total) by mouth daily as needed for nausea or vomiting.   pantoprazole (PROTONIX) 40 MG tablet TAKE 1 TABLET (40 MG TOTAL) BY MOUTH 2 (TWO) TIMES DAILY BEFORE A MEAL. (Patient taking differently: Take 40 mg by mouth every other day.)   Potassium 99 MG TABS Take 99 mg by mouth 2 (two) times daily.   rOPINIRole (REQUIP) 0.5 MG tablet TAKE 1 TABLET (0.5 MG TOTAL) BY MOUTH AT BEDTIME.   sildenafil (VIAGRA) 50 MG tablet Take 50 mg by mouth daily as needed for erectile dysfunction.   tamsulosin (FLOMAX) 0.4 MG CAPS capsule Take 1 capsule (0.4 mg total) by  mouth at bedtime.   traMADol (ULTRAM) 50 MG tablet Take 1 tablet (50 mg total) by mouth every 12 (twelve) hours as needed   vitamin B-12 (CYANOCOBALAMIN) 1000 MCG tablet Take 1,000 mcg by mouth daily.   No facility-administered encounter medications on file as of 05/15/2022.      Observations/Objective: No direct observation done as this was a telephone visit.  Assessment and Plan: 1. Type 2 diabetes mellitus with obesity (Jeremy [name]) Reported blood sugars are good.  He will continue glipizide.  Continue good eating habits.  He will come to the lab to have A1c checked. - Microalbumin / creatinine urine ratio; Future - Hemoglobin A1c; Future  2. Dizziness Advised patient that with the Flomax you have to go slow with position changes to prevent orthostatic hypotension which can present as dizziness when one goes from sitting or laying down or kneeling to standing   3. Hypertension associated with type 2 diabetes mellitus (Jeremy [name]) Reported blood pressure readings are good.  He will continue lisinopril  4. Hyperlipidemia, unspecified hyperlipidemia type Advised patient that it is recommended that all diabetics be on cholesterol-lowering medication and even more so in him who already has CAD on imaging studies.  Went over possible side effects of statin therapy including drug-induced hepatitis and muscle aches/cramps.  He is overdue for lipid panel and liver function tests.  If liver function test comes back okay, he is agreeable to trying statin therapy at a low dose. - Lipid panel; Future - Hepatic Function Panel; Future  5. Need for shingles vaccine We will have him scheduled to see a clinical pharmacist for shingles vaccine.   Follow Up Instructions: 4 months.   I discussed the assessment and treatment plan with the patient. The patient was provided an opportunity to ask questions and all were answered. The patient agreed with the plan and demonstrated an understanding of the instructions.    The patient was advised to call back or seek an in-person evaluation if the symptoms worsen or if the condition fails to improve as anticipated.  I  Spent 16 minutes on this telephone encounter  This note has been created with Surveyor, quantity. Any transcriptional errors are unintentional.  Karle Plumber, MD

## 2022-05-15 NOTE — Telephone Encounter (Signed)
Requested Prescriptions  Pending Prescriptions Disp Refills  . glipiZIDE (GLUCOTROL) 5 MG tablet 180 tablet 1    Sig: TAKE 1 TABLET BY MOUTH IN THE MORNINGS AND 1 TABLET IN THE EVENINGS.     Endocrinology:  Diabetes - Sulfonylureas Passed - 05/13/2022  1:21 PM      Passed - HBA1C is between 0 and 7.9 and within 180 days    HbA1c, POC (prediabetic range)  Date Value Ref Range Status  09/05/2019 6.3 5.7 - 6.4 % Corrected   HbA1c, POC (controlled diabetic range)  Date Value Ref Range Status  01/02/2022 5.7 0.0 - 7.0 % Final         Passed - Cr in normal range and within 360 days    Creat  Date Value Ref Range Status  10/17/2016 0.72 0.70 - 1.33 mg/dL Final    Comment:      For patients > or = 59 years of age: The upper reference limit for Creatinine is approximately 13% higher for people identified as African-American.      Creatinine, Ser  Date Value Ref Range Status  02/14/2022 0.86 0.40 - 1.50 mg/dL Final   Creatinine, Urine  Date Value Ref Range Status  12/25/2016 190 20 - 370 mg/dL Final         Passed - Valid encounter within last 6 months    Recent Outpatient Visits          Today Type 2 diabetes mellitus with obesity (HCC)   [facility] Marcine Matar, MD   1 week ago Patient left before evaluation by physician   [facility] Marcine Matar, MD   2 months ago Bloating symptom   [facility] Jonah Blue B, MD   4 months ago Type 2 diabetes mellitus with obesity [facility])   [facility] Jonah Blue B, MD   8 months ago Type 2 diabetes mellitus with obesity [facility])   [facility] Marcine Matar, MD      Future Appointments            In 1 month Lois Huxley, Cornelius Moras, RPH-CPP [facility]   In 4 months Laural Benes, Binnie Rail, MD [facility]

## 2022-05-19 ENCOUNTER — Other Ambulatory Visit: Payer: Self-pay

## 2022-05-22 ENCOUNTER — Other Ambulatory Visit: Payer: Self-pay

## 2022-05-22 ENCOUNTER — Encounter: Payer: Self-pay | Admitting: Physician Assistant

## 2022-05-22 ENCOUNTER — Encounter: Payer: Self-pay | Admitting: Internal Medicine

## 2022-05-22 ENCOUNTER — Ambulatory Visit: Payer: Self-pay | Attending: Internal Medicine

## 2022-05-22 DIAGNOSIS — E669 Obesity, unspecified: Secondary | ICD-10-CM

## 2022-05-22 DIAGNOSIS — E785 Hyperlipidemia, unspecified: Secondary | ICD-10-CM

## 2022-05-23 ENCOUNTER — Other Ambulatory Visit: Payer: Self-pay

## 2022-05-23 ENCOUNTER — Other Ambulatory Visit: Payer: Self-pay | Admitting: Internal Medicine

## 2022-05-23 DIAGNOSIS — E1169 Type 2 diabetes mellitus with other specified complication: Secondary | ICD-10-CM

## 2022-05-23 LAB — LIPID PANEL
Chol/HDL Ratio: 2.6 ratio (ref 0.0–5.0)
Cholesterol, Total: 87 mg/dL — ABNORMAL LOW (ref 100–199)
HDL: 34 mg/dL — ABNORMAL LOW (ref >39)
LDL Chol Calc (NIH): 39 mg/dL (ref 0–99)
Triglycerides: 64 mg/dL (ref 0–149)
VLDL Cholesterol Cal: 14 mg/dL (ref 5–40)

## 2022-05-23 LAB — HEMOGLOBIN A1C
Est. average glucose Bld gHb Est-mCnc: 120 mg/dL
Hgb A1c MFr Bld: 5.8 % — ABNORMAL HIGH (ref 4.8–5.6)

## 2022-05-23 LAB — HEPATIC FUNCTION PANEL
ALT: 33 IU/L (ref 0–44)
AST: 32 IU/L (ref 0–40)
Albumin: 3.9 g/dL (ref 3.8–4.9)
Alkaline Phosphatase: 111 IU/L (ref 44–121)
Bilirubin Total: <0.2 mg/dL (ref 0.0–1.2)
Bilirubin, Direct: <0.1 mg/dL (ref 0.00–0.40)
Total Protein: 6.2 g/dL (ref 6.0–8.5)

## 2022-05-24 LAB — MICROALBUMIN / CREATININE URINE RATIO
Creatinine, Urine: 127.1 mg/dL
Microalb/Creat Ratio: 4 mg/g creat (ref 0–29)
Microalbumin, Urine: 5.4 ug/mL

## 2022-05-29 ENCOUNTER — Encounter: Payer: Self-pay | Admitting: Internal Medicine

## 2022-05-29 ENCOUNTER — Inpatient Hospital Stay: Payer: Self-pay | Attending: Physician Assistant

## 2022-05-29 ENCOUNTER — Other Ambulatory Visit: Payer: Self-pay | Admitting: Internal Medicine

## 2022-05-29 ENCOUNTER — Inpatient Hospital Stay (HOSPITAL_BASED_OUTPATIENT_CLINIC_OR_DEPARTMENT_OTHER): Payer: Self-pay | Admitting: Internal Medicine

## 2022-05-29 ENCOUNTER — Other Ambulatory Visit: Payer: Self-pay

## 2022-05-29 VITALS — BP 143/59 | HR 74 | Temp 98.4°F | Resp 15 | Ht 70.0 in | Wt 215.7 lb

## 2022-05-29 DIAGNOSIS — D509 Iron deficiency anemia, unspecified: Secondary | ICD-10-CM | POA: Insufficient documentation

## 2022-05-29 DIAGNOSIS — K912 Postsurgical malabsorption, not elsewhere classified: Secondary | ICD-10-CM | POA: Insufficient documentation

## 2022-05-29 LAB — CBC WITH DIFFERENTIAL (CANCER CENTER ONLY)
Abs Immature Granulocytes: 0.05 10*3/uL (ref 0.00–0.07)
Basophils Absolute: 0.1 10*3/uL (ref 0.0–0.1)
Basophils Relative: 1 %
Eosinophils Absolute: 0.2 10*3/uL (ref 0.0–0.5)
Eosinophils Relative: 4 %
HCT: 32.3 % — ABNORMAL LOW (ref 39.0–52.0)
Hemoglobin: 10.7 g/dL — ABNORMAL LOW (ref 13.0–17.0)
Immature Granulocytes: 1 %
Lymphocytes Relative: 28 %
Lymphs Abs: 1.5 10*3/uL (ref 0.7–4.0)
MCH: 29.4 pg (ref 26.0–34.0)
MCHC: 33.1 g/dL (ref 30.0–36.0)
MCV: 88.7 fL (ref 80.0–100.0)
Monocytes Absolute: 0.5 10*3/uL (ref 0.1–1.0)
Monocytes Relative: 8 %
Neutro Abs: 3.1 10*3/uL (ref 1.7–7.7)
Neutrophils Relative %: 58 %
Platelet Count: 196 10*3/uL (ref 150–400)
RBC: 3.64 MIL/uL — ABNORMAL LOW (ref 4.22–5.81)
RDW: 13 % (ref 11.5–15.5)
WBC Count: 5.4 10*3/uL (ref 4.0–10.5)
nRBC: 0 % (ref 0.0–0.2)

## 2022-05-29 LAB — IRON AND IRON BINDING CAPACITY (CC-WL,HP ONLY)
Iron: 27 ug/dL — ABNORMAL LOW (ref 45–182)
Saturation Ratios: 7 % — ABNORMAL LOW (ref 17.9–39.5)
TIBC: 379 ug/dL (ref 250–450)
UIBC: 352 ug/dL (ref 117–376)

## 2022-05-29 NOTE — Progress Notes (Signed)
[facility] [phone]   [phone]  OFFICE PROGRESS NOTE  [name], MD [address] 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  PRIOR THERAPY: Iron infusion with Venofer 300 mg IV weekly for 3 weeks.  CURRENT THERAPY: Over-the-counter oral iron tablet with vitamin C twice daily.  INTERVAL HISTORY: Jeremy [name] 61 y.o. male returns to the clinic today for follow-up visit.  The patient is feeling fine today with no concerning complaints except for mild fatigue.  He denied having any chest pain, shortness of breath, cough or hemoptysis.  He has no nausea, vomiting but has alternating episodes of diarrhea and constipation.  He has no recent weight loss or night sweats.  He has no headache or visual changes.  He is here today for evaluation and repeat blood work.  He continues to tolerate his oral iron tablet fairly well.   MEDICAL HISTORY: Past Medical History:  Diagnosis Date   Diabetes mellitus without complication (HCC)    GERD (gastroesophageal reflux disease)    Hyperlipidemia    Hypertension    IDA (iron deficiency anemia)    Rash of entire body 03/2016   Sleep apnea     ALLERGIES:  is allergic to cymbalta [duloxetine hcl], doxycycline, other, and robaxin [methocarbamol].  MEDICATIONS:  Current Outpatient Medications  Medication Sig Dispense Refill   acetaminophen (TYLENOL) 500 MG tablet Take 1,000 mg by mouth in the morning and at bedtime.      Blood Glucose Monitoring Suppl (TRUE METRIX METER) w/Device KIT 1 each by Does not apply route 4 (four) times daily -  before meals and at bedtime. 1 kit 0   cyclobenzaprine (FLEXERIL) 10 MG tablet Take 1 tablet (10 mg total) by mouth 2 (two) times daily as needed for muscle spasms. 60 tablet 3   ferrous sulfate (FEROSUL) 325 (65 FE) MG tablet Take 1 tablet (325 mg total) by  mouth 2 (two) times daily with a meal. 180 tablet 2   fluticasone (FLONASE) 50 MCG/ACT nasal spray Place 1 spray into both nostrils daily as needed for allergies or rhinitis. 16 g 0   glipiZIDE (GLUCOTROL) 5 MG tablet TAKE 1 TABLET BY MOUTH IN THE MORNINGS AND 1 TABLET IN THE EVENINGS. 180 tablet 1   glucose blood test strip Use as instructed 100 each 12   lisinopril (ZESTRIL) 20 MG tablet Take 1 tablet (20 mg total) by mouth in the morning and at bedtime. 60 tablet 3   loperamide (IMODIUM A-D) 2 MG tablet Take 2 mg by mouth 4 (four) times daily as needed for diarrhea or loose stools.     loratadine (CLARITIN) 10 MG tablet Take 10 mg by mouth daily.     Magnesium Oxide 200 MG TABS Take 200 mg by mouth in the morning and at bedtime.     naproxen sodium (ALEVE) 220 MG tablet Take 440 mg by mouth in the morning and at bedtime.     ondansetron (ZOFRAN) 8 MG tablet Take 1 tablet (8 mg total) by mouth daily as needed for nausea or vomiting. 30 tablet 2   pantoprazole (PROTONIX) 40 MG tablet TAKE 1 TABLET (40 MG TOTAL) BY MOUTH 2 (TWO) TIMES DAILY BEFORE A MEAL. (Patient taking differently: Take 40 mg by mouth every other day.) 60 tablet 3   Potassium 99 MG TABS Take 99 mg by mouth 2 (two)  times daily.     rOPINIRole (REQUIP) 0.5 MG tablet TAKE 1 TABLET (0.5 MG TOTAL) BY MOUTH AT BEDTIME. 90 tablet 1   sildenafil (VIAGRA) 50 MG tablet Take 50 mg by mouth daily as needed for erectile dysfunction.     tamsulosin (FLOMAX) 0.4 MG CAPS capsule Take 1 capsule (0.4 mg total) by mouth at bedtime. 90 capsule 3   traMADol (ULTRAM) 50 MG tablet Take 1 tablet (50 mg total) by mouth every 12 (twelve) hours as needed 60 tablet 2   vitamin B-12 (CYANOCOBALAMIN) 1000 MCG tablet Take 1,000 mcg by mouth daily.     No current facility-administered medications for this visit.    SURGICAL HISTORY:  Past Surgical History:  Procedure Laterality Date   BACK SURGERY  1993   lumbar   BIOPSY  02/07/2019   Procedure: BIOPSY;   Surgeon: Danie Binder, MD;  [facility];  Service: Endoscopy;;   BIOPSY  01/30/2022   Procedure: BIOPSY;  Surgeon: Eloise Harman, DO;  [facility];  Service: Endoscopy;;   CERVICAL SPINE SURGERY  2000   COLONOSCOPY N/A 02/07/2019   Dr. Oneida Alar: External hemorrhoids next colonoscopy in 10 years   ESOPHAGOGASTRODUODENOSCOPY N/A 02/07/2019   Dr. Oneida Alar: Barrett's esophagus without dysplasia chronic inactive gastritis but no H. 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, nonbleeding duodenal diverticulum,   ESOPHAGOGASTRODUODENOSCOPY N/A 01/05/2020   Procedure: ESOPHAGOGASTRODUODENOSCOPY (EGD);  Surgeon: Danie Binder, MD;  [facility];  Service: Endoscopy;  Laterality: N/A;  10:30am   ESOPHAGOGASTRODUODENOSCOPY (EGD) WITH PROPOFOL N/A 12/06/2020   Procedure: ESOPHAGOGASTRODUODENOSCOPY (EGD) WITH PROPOFOL;  Surgeon: Eloise Harman, DO;  [facility];  Service: Endoscopy;  Laterality: N/A;  11:45am   ESOPHAGOGASTRODUODENOSCOPY (EGD) WITH PROPOFOL N/A 12/22/2021   Procedure: ESOPHAGOGASTRODUODENOSCOPY (EGD) WITH PROPOFOL;  Surgeon: Eloise Harman, DO;  [facility];  Service: Endoscopy;  Laterality: N/A;  10:45am, ASA 3   ESOPHAGOGASTRODUODENOSCOPY (EGD) WITH PROPOFOL N/A 01/30/2022   Procedure: ESOPHAGOGASTRODUODENOSCOPY (EGD) WITH PROPOFOL;  Surgeon: Eloise Harman, DO;  [facility];  Service: Endoscopy;  Laterality: N/A;  11:00am   GASTROJEJUNOSTOMY N/A 01/03/2021   Procedure: GASTROJEJUNOSTOMY;  Surgeon: Aviva Signs, MD;  Location: [facility];  Service: General;  Laterality: N/A;   HAND SURGERY     SAVORY DILATION N/A 01/05/2020   Procedure: SAVORY DILATION;  Surgeon: Danie Binder, MD;  [facility];  Service: Endoscopy;  Laterality: N/A;    REVIEW OF SYSTEMS:  A comprehensive review of systems was negative.   PHYSICAL EXAMINATION: General appearance:  alert, cooperative, and no distress Head: Normocephalic, without obvious abnormality, atraumatic Neck: no adenopathy, no JVD, supple, symmetrical, trachea midline, and thyroid not enlarged, symmetric, no tenderness/mass/nodules Lymph nodes: Cervical, supraclavicular, and axillary nodes normal. Resp: clear to auscultation bilaterally Back: symmetric, no curvature. ROM normal. No CVA tenderness. Cardio: regular rate and rhythm, S1, S2 normal, no murmur, click, rub or gallop GI: soft, non-tender; bowel sounds normal; no masses,  no organomegaly Extremities: extremities normal, atraumatic, no cyanosis or edema  ECOG PERFORMANCE STATUS: 1 - Symptomatic but completely ambulatory  Blood pressure (!) 143/59, pulse 74, temperature 98.4 F (36.9 C), temperature source Oral, resp. rate 15, height 5' 10"  (1.778 m), weight 215 lb 11.2 oz (97.8 kg), SpO2 100 %.  LABORATORY DATA: Lab Results  Component Value Date   WBC 5.4 05/29/2022   HGB 10.7 (L) 05/29/2022  HCT 32.3 (L) 05/29/2022   MCV 88.7 05/29/2022   PLT 196 05/29/2022      Chemistry      Component Value Date/Time   NA 139 02/14/2022 1016   NA 138 05/17/2021 1657   K 3.9 02/14/2022 1016   CL 105 02/14/2022 1016   CO2 27 02/14/2022 1016   BUN 13 02/14/2022 1016   BUN 11 05/17/2021 1657   CREATININE 0.86 02/14/2022 1016   CREATININE 0.72 10/17/2016 1012      Component Value Date/Time   CALCIUM 8.9 02/14/2022 1016   ALKPHOS 111 05/22/2022 1552   AST 32 05/22/2022 1552   ALT 33 05/22/2022 1552   BILITOT <0.2 05/22/2022 1552       RADIOGRAPHIC STUDIES: No results found.  ASSESSMENT AND PLAN: This is a very pleasant 61 years old white male with iron deficiency anemia secondary to malabsorption secondary to gastrojejunostomy in March 2022. The patient was treated with iron infusion with Venofer 300 mg IV for 3 doses.  Last dose was given in November 2022. The patient is currently on over-the-counter iron tablet with vitamin C  twice daily and tolerating it fairly well. He has no complaints today. Repeat CBC showed persistent anemia with hemoglobin of 10.7 and hematocrit 32.3%.  Iron study, ferritin, copper and ceruloplasmin are still pending. I recommended for the patient to continue with the oral iron tablet for now. I will see him back for follow-up visit in 3 months for evaluation and repeat blood work unless the patient had severe iron deficiency, I will consider him for iron infusion in the interval. He was advised to call immediately if he has any other concerning symptoms in the interval. All questions were answered. The patient knows to call the clinic with any problems, questions or concerns. We can certainly see the patient much sooner if necessary. The total time spent in the appointment was 20 minutes.  Disclaimer: This note was dictated with voice recognition software. Similar sounding words can inadvertently be transcribed and may not be corrected upon review.

## 2022-05-29 NOTE — Addendum Note (Signed)
Addended by: Charma Igo on: 05/29/2022 02:19 PM   Modules accepted: Orders

## 2022-05-30 ENCOUNTER — Encounter: Payer: Self-pay | Admitting: Internal Medicine

## 2022-05-30 ENCOUNTER — Encounter: Payer: Self-pay | Admitting: Nurse Practitioner

## 2022-05-30 ENCOUNTER — Telehealth (HOSPITAL_BASED_OUTPATIENT_CLINIC_OR_DEPARTMENT_OTHER): Payer: Self-pay | Admitting: Nurse Practitioner

## 2022-05-30 ENCOUNTER — Encounter: Payer: Self-pay | Admitting: Physician Assistant

## 2022-05-30 ENCOUNTER — Other Ambulatory Visit: Payer: Self-pay

## 2022-05-30 DIAGNOSIS — M25521 Pain in right elbow: Secondary | ICD-10-CM

## 2022-05-30 LAB — FERRITIN: Ferritin: 7 ng/mL — ABNORMAL LOW (ref 24–336)

## 2022-05-30 LAB — CERULOPLASMIN: Ceruloplasmin: 17.4 mg/dL (ref 16.0–31.0)

## 2022-05-30 MED ORDER — GABAPENTIN 300 MG PO CAPS
300.0000 mg | ORAL_CAPSULE | Freq: Three times a day (TID) | ORAL | 3 refills | Status: DC
  Filled 2022-05-30 – 2022-06-06 (×2): qty 90, 30d supply, fill #0

## 2022-05-30 NOTE — Progress Notes (Signed)
Virtual Visit via Telephone Note  I discussed the limitations, risks, security and privacy concerns of performing an evaluation and management service by telephone and the availability of in person appointments. I also discussed with the patient that there may be a patient responsible charge related to this service. The patient expressed understanding and agreed to proceed.    I connected with Jeremy [name] on 05/30/22  at   2:50 PM EDT  EDT by telephone and verified that I am speaking with the correct person using two identifiers.  Location of Patient: Private Residence   Location of Provider: [facility]    Persons participating in Telemedicine visit: Bertram Denver FNP-BC KAREEM CATHEY    History of Present Illness: Telemedicine visit for: right elbow pain  Notes right elbow pain with onset several weeks ago. Unrelated to any recent injury or trauma. Associated symptoms include numbness in 2 of his fingers on the right hand. He does state the numbness has been present for "a long time". Pain Rated 7/10 and he denies a history of gout.  Pain is described as "feeling like my elbow was hit with a hammer". Pain is not constant however it does occur daily and usually when he is using his right arm to lift objects or stretching it outwards. He denies swelling or fluid like sac along the elbow joint  He also notes right lower rib cage pain that feels like a pinching sensation. Denies shortness of breath or chest pain.     Past Medical History:  Diagnosis Date   Diabetes mellitus without complication (HCC)    GERD (gastroesophageal reflux disease)    Hyperlipidemia    Hypertension    IDA (iron deficiency anemia)    Rash of entire body 03/2016   Sleep apnea     Past Surgical History:  Procedure Laterality Date   BACK SURGERY  1993   lumbar   BIOPSY  02/07/2019   Procedure: BIOPSY;  Surgeon: West Bali, MD;  [facility];  Service:  Endoscopy;;   BIOPSY  01/30/2022   Procedure: BIOPSY;  Surgeon: Lanelle Bal, DO;  [facility];  Service: Endoscopy;;   CERVICAL SPINE SURGERY  2000   COLONOSCOPY N/A 02/07/2019   Dr. Darrick Penna: External hemorrhoids next colonoscopy in 10 years   ESOPHAGOGASTRODUODENOSCOPY N/A 02/07/2019   Dr. Darrick Penna: Barrett's esophagus without dysplasia chronic inactive gastritis but no H. 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, nonbleeding duodenal diverticulum,   ESOPHAGOGASTRODUODENOSCOPY N/A 01/05/2020   Procedure: ESOPHAGOGASTRODUODENOSCOPY (EGD);  Surgeon: West Bali, MD;  [facility];  Service: Endoscopy;  Laterality: N/A;  10:30am   ESOPHAGOGASTRODUODENOSCOPY (EGD) WITH PROPOFOL N/A 12/06/2020   Procedure: ESOPHAGOGASTRODUODENOSCOPY (EGD) WITH PROPOFOL;  Surgeon: Lanelle Bal, DO;  [facility];  Service: Endoscopy;  Laterality: N/A;  11:45am   ESOPHAGOGASTRODUODENOSCOPY (EGD) WITH PROPOFOL N/A 12/22/2021   Procedure: ESOPHAGOGASTRODUODENOSCOPY (EGD) WITH PROPOFOL;  Surgeon: Lanelle Bal, DO;  [facility];  Service: Endoscopy;  Laterality: N/A;  10:45am, [name] 3   ESOPHAGOGASTRODUODENOSCOPY (EGD) WITH PROPOFOL N/A 01/30/2022   Procedure: ESOPHAGOGASTRODUODENOSCOPY (EGD) WITH PROPOFOL;  Surgeon: Lanelle Bal, DO;  [facility];  Service: Endoscopy;  Laterality: N/A;  11:00am   GASTROJEJUNOSTOMY N/A 01/03/2021   Procedure: GASTROJEJUNOSTOMY;  Surgeon: [name], MD;  [facility]: [facility];  Service: General;  Laterality: N/A;   HAND SURGERY  SAVORY DILATION N/A 01/05/2020   Procedure: SAVORY DILATION;  Surgeon: West Bali, MD;  [facility];  Service: Endoscopy;  Laterality: N/A;    Family History  Problem Relation Age of Onset   Breast cancer Mother    Pancreatic cancer Mother    CAD Father    Colon cancer Paternal Grandfather     Social History    Socioeconomic History   Marital status: Significant Other    Spouse name: Not on file   Number of children: Not on file   Years of education: Not on file   Highest education level: Not on file  Occupational History   Occupation: general contractor  Tobacco Use   Smoking status: Former    Types: Cigarettes    Quit date: 10/16/1988    Years since quitting: 33.6   Smokeless tobacco: Never  Vaping Use   Vaping Use: Never used  Substance and Sexual Activity   Alcohol use: No   Drug use: Yes    Comment: rare   Sexual activity: Not on file  Other Topics Concern   Not on file  Social History Narrative   Volunteers as IT sales professional   Social Determinants of Health   Financial Resource Strain: Not on file  Food Insecurity: Not on file  Transportation Needs: Not on file  Physical Activity: Not on file  Stress: Not on file  Social Connections: Not on file     Observations/Objective: Awake, alert and oriented x 3   Review of Systems  Constitutional:  Negative for fever, malaise/fatigue and weight loss.  HENT: Negative.  Negative for nosebleeds.   Eyes: Negative.  Negative for blurred vision, double vision and photophobia.  Respiratory: Negative.  Negative for cough and shortness of breath.   Cardiovascular: Negative.  Negative for chest pain, palpitations and leg swelling.  Gastrointestinal: Negative.  Negative for heartburn, nausea and vomiting.  Musculoskeletal:  Positive for joint pain. Negative for myalgias.  Neurological:  Positive for tingling and sensory change. Negative for dizziness, focal weakness, seizures and headaches.  Psychiatric/Behavioral: Negative.  Negative for suicidal ideas.     Assessment and Plan: Diagnoses and all orders for this visit:  Right elbow pain -     gabapentin (NEURONTIN) 300 MG capsule; Take 1 capsule (300 mg total) by mouth 3 (three) times daily. May use elbow sleeve for support    Follow Up Instructions Return if symptoms worsen or  fail to improve.     I discussed the assessment and treatment plan with the patient. The patient was provided an opportunity to ask questions and all were answered. The patient agreed with the plan and demonstrated an understanding of the instructions.   The patient was advised to call back or seek an in-person evaluation if the symptoms worsen or if the condition fails to improve as anticipated.  I provided 12 minutes of non-face-to-face time during this encounter including median intraservice time, reviewing previous notes, labs, imaging, medications and explaining diagnosis and management.  Claiborne Rigg, FNP-BC

## 2022-06-01 ENCOUNTER — Telehealth: Payer: Self-pay | Admitting: Pharmacy Technician

## 2022-06-02 LAB — COPPER, SERUM: Copper: 74 ug/dL (ref 69–132)

## 2022-06-05 ENCOUNTER — Other Ambulatory Visit: Payer: Self-pay

## 2022-06-06 ENCOUNTER — Encounter: Payer: Self-pay | Admitting: Physician Assistant

## 2022-06-06 ENCOUNTER — Encounter: Payer: Self-pay | Admitting: Internal Medicine

## 2022-06-06 ENCOUNTER — Other Ambulatory Visit: Payer: Self-pay

## 2022-06-07 ENCOUNTER — Telehealth: Payer: Self-pay | Admitting: Internal Medicine

## 2022-06-07 NOTE — Telephone Encounter (Signed)
Called patient regarding upcoming November appointments, patient is notified. 

## 2022-06-08 ENCOUNTER — Ambulatory Visit (INDEPENDENT_AMBULATORY_CARE_PROVIDER_SITE_OTHER): Payer: Self-pay

## 2022-06-08 VITALS — BP 128/64 | HR 72 | Temp 97.7°F | Resp 18 | Ht 70.0 in | Wt 216.6 lb

## 2022-06-08 DIAGNOSIS — D509 Iron deficiency anemia, unspecified: Secondary | ICD-10-CM

## 2022-06-08 MED ORDER — SODIUM CHLORIDE 0.9 % IV SOLN
300.0000 mg | INTRAVENOUS | Status: DC
  Administered 2022-06-08: 300 mg via INTRAVENOUS
  Filled 2022-06-08: qty 15

## 2022-06-08 NOTE — Progress Notes (Signed)
Diagnosis: Iron Deficiency Anemia  Provider:  Chilton Greathouse MD  Procedure: Infusion  IV Type: Peripheral, IV Location: R Antecubital  Venofer (Iron Sucrose), Dose: 300 mg  Infusion Start Time: 0942  Infusion Stop Time: 1125  Post Infusion IV Care: Peripheral IV Discontinued  Discharge: Condition: Good, Destination: Home . AVS provided to patient.   Performed by:  Loney Hering, LPN

## 2022-06-10 ENCOUNTER — Encounter: Payer: Self-pay | Admitting: Internal Medicine

## 2022-06-12 ENCOUNTER — Encounter: Payer: Self-pay | Admitting: Physician Assistant

## 2022-06-12 ENCOUNTER — Other Ambulatory Visit: Payer: Self-pay

## 2022-06-12 ENCOUNTER — Encounter: Payer: Self-pay | Admitting: Internal Medicine

## 2022-06-16 ENCOUNTER — Other Ambulatory Visit: Payer: Self-pay

## 2022-06-16 ENCOUNTER — Ambulatory Visit (INDEPENDENT_AMBULATORY_CARE_PROVIDER_SITE_OTHER): Payer: Self-pay | Admitting: *Deleted

## 2022-06-16 VITALS — BP 118/62 | HR 72 | Temp 97.8°F | Resp 18 | Ht 70.0 in | Wt 216.2 lb

## 2022-06-16 DIAGNOSIS — D509 Iron deficiency anemia, unspecified: Secondary | ICD-10-CM

## 2022-06-16 MED ORDER — SODIUM CHLORIDE 0.9 % IV SOLN
300.0000 mg | INTRAVENOUS | Status: DC
  Administered 2022-06-16: 300 mg via INTRAVENOUS
  Filled 2022-06-16: qty 15

## 2022-06-16 NOTE — Progress Notes (Signed)
Diagnosis: Iron Deficiency Anemia  Provider:  Chilton Greathouse MD  Procedure: Infusion  IV Type: Peripheral, IV Location: R Forearm  Venofer (Iron Sucrose), Dose: 300 mg  Infusion Start Time: 1012 am,    restarted 1026 am  Infusion Stop Time: 1023 am,  stopped 1215pm Site hurting , IV restarted  Post Infusion IV Care: Observation period completed and Peripheral IV Discontinued  Discharge: Condition: Good, Destination: Home . AVS provided to patient.   Performed by:  Forrest Moron, RN

## 2022-06-19 ENCOUNTER — Other Ambulatory Visit: Payer: Self-pay | Admitting: Internal Medicine

## 2022-06-20 ENCOUNTER — Encounter: Payer: Self-pay | Admitting: Physician Assistant

## 2022-06-20 ENCOUNTER — Encounter: Payer: Self-pay | Admitting: Internal Medicine

## 2022-06-20 ENCOUNTER — Other Ambulatory Visit: Payer: Self-pay

## 2022-06-20 ENCOUNTER — Other Ambulatory Visit: Payer: Self-pay | Admitting: Internal Medicine

## 2022-06-20 MED ORDER — TRAMADOL HCL 50 MG PO TABS
50.0000 mg | ORAL_TABLET | Freq: Two times a day (BID) | ORAL | 2 refills | Status: DC | PRN
  Filled 2022-06-20: qty 60, 30d supply, fill #0
  Filled 2022-07-16: qty 60, 30d supply, fill #1
  Filled 2022-08-20 – 2022-08-30 (×2): qty 60, 30d supply, fill #2

## 2022-06-21 ENCOUNTER — Encounter: Payer: Self-pay | Admitting: Internal Medicine

## 2022-06-21 ENCOUNTER — Other Ambulatory Visit: Payer: Self-pay

## 2022-06-21 ENCOUNTER — Encounter: Payer: Self-pay | Admitting: Physician Assistant

## 2022-06-21 MED ORDER — CYCLOBENZAPRINE HCL 10 MG PO TABS
10.0000 mg | ORAL_TABLET | Freq: Two times a day (BID) | ORAL | 3 refills | Status: DC | PRN
  Filled 2022-06-21: qty 60, 30d supply, fill #0
  Filled 2022-07-16: qty 60, 30d supply, fill #1
  Filled 2022-08-20 – 2022-08-30 (×2): qty 60, 30d supply, fill #2
  Filled 2022-09-25 – 2022-10-13 (×4): qty 60, 30d supply, fill #3

## 2022-06-22 ENCOUNTER — Ambulatory Visit: Payer: Self-pay | Admitting: Pharmacist

## 2022-06-23 ENCOUNTER — Ambulatory Visit (INDEPENDENT_AMBULATORY_CARE_PROVIDER_SITE_OTHER): Payer: Self-pay

## 2022-06-23 ENCOUNTER — Other Ambulatory Visit: Payer: Self-pay

## 2022-06-23 VITALS — BP 117/52 | HR 67 | Temp 98.3°F | Resp 16 | Ht 70.0 in | Wt 216.4 lb

## 2022-06-23 DIAGNOSIS — D509 Iron deficiency anemia, unspecified: Secondary | ICD-10-CM

## 2022-06-23 MED ORDER — SODIUM CHLORIDE 0.9 % IV SOLN
300.0000 mg | INTRAVENOUS | Status: DC
  Administered 2022-06-23: 300 mg via INTRAVENOUS
  Filled 2022-06-23: qty 15

## 2022-06-23 NOTE — Progress Notes (Signed)
Diagnosis: Iron Deficiency Anemia  Provider:  Chilton Greathouse MD  Procedure: Infusion  IV Type: Peripheral, IV Location: R Antecubital  Venofer (Iron Sucrose), Dose: 300 mg  Infusion Start Time: 0929  Infusion Stop Time: 1108  Post Infusion IV Care: Peripheral IV Discontinued  Discharge: Condition: Good, Destination: Home . AVS provided to patient.   Performed by:  Garnette Czech, RN

## 2022-06-26 ENCOUNTER — Other Ambulatory Visit: Payer: Self-pay | Admitting: Internal Medicine

## 2022-06-26 DIAGNOSIS — J302 Other seasonal allergic rhinitis: Secondary | ICD-10-CM

## 2022-06-27 ENCOUNTER — Other Ambulatory Visit: Payer: Self-pay

## 2022-06-27 ENCOUNTER — Encounter: Payer: Self-pay | Admitting: Physician Assistant

## 2022-06-27 ENCOUNTER — Encounter: Payer: Self-pay | Admitting: Internal Medicine

## 2022-06-27 MED ORDER — FLUTICASONE PROPIONATE 50 MCG/ACT NA SUSP
1.0000 | Freq: Every day | NASAL | 1 refills | Status: DC | PRN
  Filled 2022-06-27: qty 16, 30d supply, fill #0
  Filled 2022-10-12: qty 16, 30d supply, fill #1

## 2022-06-30 ENCOUNTER — Other Ambulatory Visit: Payer: Self-pay

## 2022-07-04 NOTE — Telephone Encounter (Signed)
Mandy, please call patient and see if he can come in one of my morning openings on Thursday. Thanks!

## 2022-07-06 ENCOUNTER — Telehealth: Payer: Self-pay | Admitting: *Deleted

## 2022-07-06 ENCOUNTER — Ambulatory Visit (INDEPENDENT_AMBULATORY_CARE_PROVIDER_SITE_OTHER): Payer: Self-pay | Admitting: Gastroenterology

## 2022-07-06 ENCOUNTER — Encounter: Payer: Self-pay | Admitting: Gastroenterology

## 2022-07-06 DIAGNOSIS — R112 Nausea with vomiting, unspecified: Secondary | ICD-10-CM | POA: Insufficient documentation

## 2022-07-06 DIAGNOSIS — R1010 Upper abdominal pain, unspecified: Secondary | ICD-10-CM | POA: Insufficient documentation

## 2022-07-06 NOTE — Telephone Encounter (Signed)
Pt informed CT scheduled for 07/12/22 at 3 pm, arrive at 2:30 pm, nothing to eat or drink 4 hours prior to appointment. Will have to pick up contrast from hospital, drink 1 bottle at 1 pm and 2nd bottle at 2 pm. Go in through Entrance C of the hospital. Pt verbalized understanding.

## 2022-07-06 NOTE — Progress Notes (Signed)
Gastroenterology Office Note     Primary Care Physician:  [name], MD  Primary Gastroenterologist:   Chief Complaint   Chief Complaint  Patient presents with   Follow-up    Pt complains of upper abd pain and nausea     History of Present Illness   Jeremy [name] is a 61 y.o. 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 Arnoldo Morale on 01/03/2021.    EGD April 2023: long-segment Barrett's s/p biopsy, gastric bypass with normal sized puch and intact staple line. GJ anastomosis with healthy mucosa. Gastritis.    Sunday evening started having worsening abdominal pain in upper abdomen, associated N/V,  no sick contacts. Upper abd pain X 1 month. If eating, goes away for only 15-20 minutes then returns. Feels better past 2 days. Last vomiting 2 days ago. Looked dark but had eaten choc cake. Stool dark brown. Was having diarrhea on Sunday but better now. Imodium helped. BM yesterday.   No ETOH. Takes Aleve BID. Only taking Protonix every other day. States if takes it daily, he feels too full.   Past Medical History:  Diagnosis Date   Diabetes mellitus without complication (HCC)    GERD (gastroesophageal reflux disease)    Hyperlipidemia    Hypertension    IDA (iron deficiency anemia)    Rash of entire body 03/2016   Sleep apnea     Past Surgical History:  Procedure Laterality Date   BACK SURGERY  1993   lumbar   BIOPSY  02/07/2019   Procedure: BIOPSY;  Surgeon: Danie Binder, MD;  [facility];  Service: Endoscopy;;   BIOPSY  01/30/2022   Procedure: BIOPSY;  Surgeon: Eloise Harman, DO;  [facility];  Service: Endoscopy;;   CERVICAL SPINE SURGERY  2000   COLONOSCOPY N/A 02/07/2019   Dr. Oneida Alar: External hemorrhoids next colonoscopy  in 10 years   ESOPHAGOGASTRODUODENOSCOPY N/A 02/07/2019   Dr. Oneida Alar: Barrett's esophagus without dysplasia chronic inactive gastritis but no H. 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, nonbleeding duodenal diverticulum,   ESOPHAGOGASTRODUODENOSCOPY N/A 01/05/2020   Procedure: ESOPHAGOGASTRODUODENOSCOPY (EGD);  Surgeon: Danie Binder, MD;  [facility];  Service: Endoscopy;  Laterality: N/A;  10:30am   ESOPHAGOGASTRODUODENOSCOPY (EGD) WITH PROPOFOL N/A 12/06/2020   Procedure: ESOPHAGOGASTRODUODENOSCOPY (EGD) WITH PROPOFOL;  Surgeon: Eloise Harman, DO;  [facility];  Service: Endoscopy;  Laterality: N/A;  11:45am   ESOPHAGOGASTRODUODENOSCOPY (EGD) WITH PROPOFOL N/A 12/22/2021   long-segment Barrett's s/p biopsy, gastric bypass with normal sized puch and intact staple line. GJ anastomosis with healthy mucosa. Gastritis.   ESOPHAGOGASTRODUODENOSCOPY (EGD) WITH PROPOFOL N/A 01/30/2022   Procedure: ESOPHAGOGASTRODUODENOSCOPY (EGD) WITH PROPOFOL;  Surgeon: Eloise Harman, DO;  [facility];  Service: Endoscopy;  Laterality: N/A;  11:00am   GASTROJEJUNOSTOMY N/A 01/03/2021   Procedure: GASTROJEJUNOSTOMY;  Surgeon: Aviva Signs, MD;  [facility]: [facility];  Service: General;  Laterality: N/A;   HAND SURGERY     SAVORY DILATION N/A 01/05/2020   Procedure: SAVORY DILATION;  Surgeon: Danie Binder, MD;  [facility];  Service: Endoscopy;  Laterality: N/A;    Current Outpatient Medications  Medication Sig Dispense Refill   acetaminophen (TYLENOL) 500 MG tablet Take 1,000 mg by  mouth in the morning and at bedtime.      Blood Glucose Monitoring Suppl (TRUE METRIX METER) w/Device KIT 1 each by Does not apply route 4 (four) times daily -  before meals and at bedtime. 1 kit 0   cyclobenzaprine (FLEXERIL) 10 MG tablet Take 1 tablet (10 mg total) by mouth 2 (two) times daily as needed for muscle spasms. 60  tablet 3   ferrous sulfate (FEROSUL) 325 (65 FE) MG tablet Take 1 tablet (325 mg total) by mouth 2 (two) times daily with a meal. 180 tablet 2   fluticasone (FLONASE) 50 MCG/ACT nasal spray Place 1 spray into both nostrils daily as needed for allergies or rhinitis. 16 g 1   glipiZIDE (GLUCOTROL) 5 MG tablet TAKE 1 TABLET BY MOUTH IN THE MORNINGS AND 1 TABLET IN THE EVENINGS. 180 tablet 1   glucose blood test strip Use as instructed 100 each 12   lisinopril (ZESTRIL) 20 MG tablet Take 1 tablet (20 mg total) by mouth in the morning and at bedtime. 60 tablet 3   loperamide (IMODIUM A-D) 2 MG tablet Take 2 mg by mouth 4 (four) times daily as needed for diarrhea or loose stools.     loratadine (CLARITIN) 10 MG tablet Take 10 mg by mouth daily.     Magnesium Oxide 200 MG TABS Take 200 mg by mouth in the morning and at bedtime.     naproxen sodium (ALEVE) 220 MG tablet Take 440 mg by mouth in the morning and at bedtime.     ondansetron (ZOFRAN) 8 MG tablet Take 1 tablet (8 mg total) by mouth daily as needed for nausea or vomiting. 30 tablet 2   pantoprazole (PROTONIX) 40 MG tablet TAKE 1 TABLET (40 MG TOTAL) BY MOUTH 2 (TWO) TIMES DAILY BEFORE A MEAL. (Patient taking differently: Take 40 mg by mouth every other day.) 60 tablet 3   Potassium 99 MG TABS Take 99 mg by mouth 2 (two) times daily.     rOPINIRole (REQUIP) 0.5 MG tablet TAKE 1 TABLET (0.5 MG TOTAL) BY MOUTH AT BEDTIME. 90 tablet 1   sildenafil (VIAGRA) 50 MG tablet Take 50 mg by mouth daily as needed for erectile dysfunction.     traMADol (ULTRAM) 50 MG tablet Take 1 tablet (50 mg total) by mouth every 12 (twelve) hours as needed 60 tablet 2   vitamin B-12 (CYANOCOBALAMIN) 1000 MCG tablet Take 1,000 mcg by mouth daily.     No current facility-administered medications for this visit.    Allergies as of 07/06/2022 - Review Complete 07/06/2022  Allergen Reaction Noted   Cymbalta [duloxetine hcl] Other (See Comments) 06/05/2017   Doxycycline   03/02/2021   Other Nausea And Vomiting 01/24/2019   Robaxin [methocarbamol] Other (See Comments) 06/02/2019    Family History  Problem Relation Age of Onset   Breast cancer Mother    Pancreatic cancer Mother    CAD Father    Colon cancer Paternal Grandfather     Social History   Socioeconomic History   Marital status: Significant Other    Spouse name: Not on file   Number of children: Not on file   Years of education: Not on file   Highest education level: Not on file  Occupational History   Occupation: general contractor  Tobacco Use   Smoking status: Former    Types: Cigarettes    Quit date: 10/16/1988    Years since quitting: 33.7   Smokeless tobacco: Never  Vaping Use  Vaping Use: Never used  Substance and Sexual Activity   Alcohol use: No   Drug use: Yes    Comment: rare   Sexual activity: Not on file  Other Topics Concern   Not on file  Social History Narrative   Volunteers as Airline pilot   Social Determinants of Health   Financial Resource Strain: Not on file  Food Insecurity: Not on file  Transportation Needs: Not on file  Physical Activity: Not on file  Stress: Not on file  Social Connections: Not on file  Intimate Partner Violence: Not on file     Review of Systems   Gen: Denies any fever, chills, fatigue, weight loss, lack of appetite.  CV: Denies chest pain, heart palpitations, peripheral edema, syncope.  Resp: Denies shortness of breath at rest or with exertion. Denies wheezing or cough.  GI: see HPI GU : Denies urinary burning, urinary frequency, urinary hesitancy MS: Denies joint pain, muscle weakness, cramps, or limitation of movement.  Derm: Denies rash, itching, dry skin Psych: Denies depression, anxiety, memory loss, and confusion Heme: Denies bruising, bleeding, and enlarged lymph nodes.   Physical Exam   BP 135/69   Pulse 77   Temp (!) 97.3 F (36.3 C)   Ht _0  (1.778 m)   Wt 211 lb 9.6 oz (96 kg)   BMI 30.36 kg/m   General:   Alert and oriented. Pleasant and cooperative. Well-nourished and well-developed.  Head:  Normocephalic and atraumatic. Eyes:  Without icterus Abdomen:  +BS, soft, mild TTP upper abdomen and non-distended. No HSM noted. No guarding or rebound. No masses appreciated.  Rectal:  Deferred  Msk:  Symmetrical without gross deformities. Normal posture. Extremities:  Without edema. Neurologic:  Alert and  oriented x4;  grossly normal neurologically. Skin:  Intact without significant lesions or rashes. Psych:  Alert and cooperative. Normal mood and affect.   Assessment   Jeremy [name] is a 61 y.o. 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 Arnoldo Morale on 01/03/2021.    EGD April 2023: long-segment Barrett's s/p biopsy, gastric bypass with normal sized puch and intact staple line. GJ anastomosis with healthy mucosa. Gastritis.   Now with abdominal pain for one month and interesting symptoms of improvement after eating that is short-lived, with recurrence of pain thereafter. Now with associated N/V the past week although improved at time of visit today. Continues to take Aleve BID and not taking a PPI daily, as he states PPI daily has caused fullness sensation in the past.  I have asked that he resume PPI daily and give progress report with how he does. May have to change PPIs; however, with history of Barrett's and Roux-en-Y, recommend PPI therapy indefinitely.   Ordering CT abdomen/pelvis with contrast. CMP today. Recent CBC on file. Continues to follow Hematology due to [place].     PLAN    CMP PPI daily CT abd/pelvis with contrast 3 month follow-up regardless   Annitta Needs, PhD, ANP-BC [facility]

## 2022-07-06 NOTE — Patient Instructions (Signed)
Please take Protonix every day.  Please have blood work done.  We are arranging a CT scan!  Please update me in a week with how you are doing.  We will see you in 3 months regardless!  I enjoyed seeing you again today! As you know, I value our relationship and want to provide genuine, compassionate, and quality care. I welcome your feedback. If you receive a survey regarding your visit,  I greatly appreciate you taking time to fill this out. See you next time!  Annitta Needs, PhD, ANP-BC [facility]

## 2022-07-12 ENCOUNTER — Ambulatory Visit (HOSPITAL_COMMUNITY)
Admission: RE | Admit: 2022-07-12 | Discharge: 2022-07-12 | Disposition: A | Discharge status: Home / Self Care | Payer: Self-pay | Source: Ambulatory Visit | Attending: Gastroenterology | Admitting: Gastroenterology

## 2022-07-12 DIAGNOSIS — R1010 Upper abdominal pain, unspecified: Secondary | ICD-10-CM | POA: Insufficient documentation

## 2022-07-12 DIAGNOSIS — R112 Nausea with vomiting, unspecified: Secondary | ICD-10-CM | POA: Insufficient documentation

## 2022-07-12 LAB — POCT I-STAT CREATININE: Creatinine, Ser: 0.8 mg/dL (ref 0.61–1.24)

## 2022-07-12 MED ORDER — IOHEXOL 350 MG/ML SOLN
75.0000 mL | Freq: Once | INTRAVENOUS | Status: AC | PRN
  Administered 2022-07-12: 75 mL via INTRAVENOUS

## 2022-07-16 ENCOUNTER — Other Ambulatory Visit: Payer: Self-pay | Admitting: Internal Medicine

## 2022-07-17 ENCOUNTER — Encounter: Payer: Self-pay | Admitting: Physician Assistant

## 2022-07-17 ENCOUNTER — Ambulatory Visit: Payer: Self-pay | Admitting: Gastroenterology

## 2022-07-17 ENCOUNTER — Encounter: Payer: Self-pay | Admitting: Internal Medicine

## 2022-07-17 ENCOUNTER — Other Ambulatory Visit: Payer: Self-pay

## 2022-07-17 MED ORDER — PANTOPRAZOLE SODIUM 40 MG PO TBEC
40.0000 mg | DELAYED_RELEASE_TABLET | Freq: Two times a day (BID) | ORAL | 1 refills | Status: DC
  Filled 2022-07-17: qty 60, 30d supply, fill #0
  Filled 2022-11-05 – 2022-11-06 (×2): qty 60, 30d supply, fill #1

## 2022-07-17 NOTE — Telephone Encounter (Signed)
Requested Prescriptions  Pending Prescriptions Disp Refills  . pantoprazole (PROTONIX) 40 MG tablet 60 tablet 1    Sig: TAKE 1 TABLET (40 MG TOTAL) BY MOUTH 2 (TWO) TIMES DAILY BEFORE A MEAL.     Gastroenterology: Proton Pump Inhibitors Passed - 07/16/2022 10:08 PM      Passed - Valid encounter within last 12 months    Recent Outpatient Visits          1 month ago Right elbow pain   [place], [place] W, NP   2 months ago Type 2 diabetes mellitus with obesity [facility])   [place] [name], MD   2 months ago Patient left before evaluation by [place] [name], MD   5 months ago Bloating symptom   [place], MD   6 months ago Type 2 diabetes mellitus with obesity [facility])   [place], MD      Future Appointments            In 1 month Wynetta Emery Dalbert Batman, MD [place]

## 2022-07-19 ENCOUNTER — Other Ambulatory Visit: Payer: Self-pay

## 2022-08-15 ENCOUNTER — Encounter: Payer: Self-pay | Admitting: Internal Medicine

## 2022-08-20 ENCOUNTER — Other Ambulatory Visit: Payer: Self-pay | Admitting: Family Medicine

## 2022-08-21 ENCOUNTER — Encounter: Payer: Self-pay | Admitting: Internal Medicine

## 2022-08-21 ENCOUNTER — Other Ambulatory Visit: Payer: Self-pay

## 2022-08-21 ENCOUNTER — Encounter: Payer: Self-pay | Admitting: Physician Assistant

## 2022-08-21 MED ORDER — LISINOPRIL 20 MG PO TABS
20.0000 mg | ORAL_TABLET | Freq: Two times a day (BID) | ORAL | 1 refills | Status: DC
  Filled 2022-08-21 – 2022-08-30 (×2): qty 180, 90d supply, fill #0
  Filled 2022-11-21 (×2): qty 180, 90d supply, fill #1

## 2022-08-21 NOTE — Telephone Encounter (Signed)
Requested Prescriptions  Pending Prescriptions Disp Refills   lisinopril (ZESTRIL) 20 MG tablet 180 tablet 1    Sig: Take 1 tablet (20 mg total) by mouth in the morning and at bedtime.     Cardiovascular:  ACE Inhibitors Failed - 08/20/2022  6:36 PM      Failed - K in normal range and within 180 days    Potassium  Date Value Ref Range Status  02/14/2022 3.9 3.5 - 5.1 mEq/L Final         Passed - Cr in normal range and within 180 days    Creat  Date Value Ref Range Status  10/17/2016 0.72 0.70 - 1.33 mg/dL Final    Comment:      For patients > or = 59 years of age: The upper reference limit for Creatinine is approximately 13% higher for people identified as African-American.      Creatinine, Ser  Date Value Ref Range Status  07/12/2022 0.80 0.61 - 1.24 mg/dL Final   Creatinine, Urine  Date Value Ref Range Status  12/25/2016 190 20 - 370 mg/dL Final         Passed - Patient is not pregnant      Passed - Last BP in normal range    BP Readings from Last 1 Encounters:  07/06/22 135/69         Passed - Valid encounter within last 6 months    Recent Outpatient Visits           2 months ago Right elbow pain   [place], [place] W, NP   3 months ago Type 2 diabetes mellitus with obesity [facility])   [place] [name], MD   3 months ago Patient left before evaluation by [place], Deborah B, MD   6 months ago Bloating symptom   [place], MD   7 months ago Type 2 diabetes mellitus with obesity [facility])   [place], MD       Future Appointments             In 3 weeks Wynetta Emery Dalbert Batman, MD [place]

## 2022-08-25 ENCOUNTER — Other Ambulatory Visit: Payer: Self-pay

## 2022-08-30 ENCOUNTER — Inpatient Hospital Stay (HOSPITAL_BASED_OUTPATIENT_CLINIC_OR_DEPARTMENT_OTHER): Payer: Self-pay | Admitting: Internal Medicine

## 2022-08-30 ENCOUNTER — Inpatient Hospital Stay: Payer: Self-pay | Attending: Physician Assistant

## 2022-08-30 ENCOUNTER — Other Ambulatory Visit: Payer: Self-pay

## 2022-08-30 ENCOUNTER — Other Ambulatory Visit: Payer: Self-pay | Admitting: Internal Medicine

## 2022-08-30 ENCOUNTER — Encounter: Payer: Self-pay | Admitting: Physician Assistant

## 2022-08-30 ENCOUNTER — Encounter: Payer: Self-pay | Admitting: Internal Medicine

## 2022-08-30 VITALS — BP 143/68 | HR 73 | Temp 98.6°F | Resp 15 | Ht 70.0 in | Wt 211.1 lb

## 2022-08-30 DIAGNOSIS — D500 Iron deficiency anemia secondary to blood loss (chronic): Secondary | ICD-10-CM

## 2022-08-30 DIAGNOSIS — D509 Iron deficiency anemia, unspecified: Secondary | ICD-10-CM

## 2022-08-30 DIAGNOSIS — D508 Other iron deficiency anemias: Secondary | ICD-10-CM | POA: Insufficient documentation

## 2022-08-30 DIAGNOSIS — K912 Postsurgical malabsorption, not elsewhere classified: Secondary | ICD-10-CM | POA: Insufficient documentation

## 2022-08-30 LAB — CBC WITH DIFFERENTIAL (CANCER CENTER ONLY)
Abs Immature Granulocytes: 0.04 10*3/uL (ref 0.00–0.07)
Basophils Absolute: 0 10*3/uL (ref 0.0–0.1)
Basophils Relative: 1 %
Eosinophils Absolute: 0.3 10*3/uL (ref 0.0–0.5)
Eosinophils Relative: 7 %
HCT: 32.5 % — ABNORMAL LOW (ref 39.0–52.0)
Hemoglobin: 10.2 g/dL — ABNORMAL LOW (ref 13.0–17.0)
Immature Granulocytes: 1 %
Lymphocytes Relative: 28 %
Lymphs Abs: 1.4 10*3/uL (ref 0.7–4.0)
MCH: 29 pg (ref 26.0–34.0)
MCHC: 31.4 g/dL (ref 30.0–36.0)
MCV: 92.3 fL (ref 80.0–100.0)
Monocytes Absolute: 0.5 10*3/uL (ref 0.1–1.0)
Monocytes Relative: 10 %
Neutro Abs: 2.6 10*3/uL (ref 1.7–7.7)
Neutrophils Relative %: 53 %
Platelet Count: 209 10*3/uL (ref 150–400)
RBC: 3.52 MIL/uL — ABNORMAL LOW (ref 4.22–5.81)
RDW: 13.7 % (ref 11.5–15.5)
WBC Count: 4.9 10*3/uL (ref 4.0–10.5)
nRBC: 0 % (ref 0.0–0.2)

## 2022-08-30 LAB — IRON AND IRON BINDING CAPACITY (CC-WL,HP ONLY)
Iron: 17 ug/dL — ABNORMAL LOW (ref 45–182)
Saturation Ratios: 4 % — ABNORMAL LOW (ref 17.9–39.5)
TIBC: 395 ug/dL (ref 250–450)
UIBC: 378 ug/dL — ABNORMAL HIGH (ref 117–376)

## 2022-08-30 LAB — FERRITIN: Ferritin: 7 ng/mL — ABNORMAL LOW (ref 24–336)

## 2022-08-30 NOTE — Progress Notes (Signed)
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  PRIOR THERAPY: Iron infusion with Venofer 300 mg IV weekly for 3 weeks.  CURRENT THERAPY: Over-the-counter oral iron tablet with vitamin C twice daily.  INTERVAL HISTORY: Jeremy [name] 61 y.o. male returns to the clinic today for follow-up visit.  The patient is feeling fine today with no concerning complaints except for mild fatigue.  He denied having any current chest pain, shortness of breath, cough or hemoptysis.  He denied having any fever or chills.  He has no nausea, vomiting, diarrhea or constipation.  He continues to tolerate over-the-counter oral iron tablets with vitamin C fairly well.  He is here today for evaluation and repeat blood work.  MEDICAL HISTORY: Past Medical History:  Diagnosis Date   Diabetes mellitus without complication (HCC)    GERD (gastroesophageal reflux disease)    Hyperlipidemia    Hypertension    IDA (iron deficiency anemia)    Rash of entire body 03/2016   Sleep apnea     ALLERGIES:  is allergic to cymbalta [duloxetine hcl], doxycycline, other, and robaxin [methocarbamol].  MEDICATIONS:  Current Outpatient Medications  Medication Sig Dispense Refill   acetaminophen (TYLENOL) 500 MG tablet Take 1,000 mg by mouth in the morning and at bedtime.      Blood Glucose Monitoring Suppl (TRUE METRIX METER) w/Device KIT 1 each by Does not apply route 4 (four) times daily -  before meals and at bedtime. 1 kit 0   cyclobenzaprine (FLEXERIL) 10 MG tablet Take 1 tablet (10 mg total) by mouth 2 (two) times daily as needed for muscle spasms. 60 tablet 3   ferrous sulfate (FEROSUL) 325 (65 FE) MG tablet Take 1 tablet (325 mg total) by mouth 2 (two) times daily with a meal. 180  tablet 2   fluticasone (FLONASE) 50 MCG/ACT nasal spray Place 1 spray into both nostrils daily as needed for allergies or rhinitis. 16 g 1   glipiZIDE (GLUCOTROL) 5 MG tablet TAKE 1 TABLET BY MOUTH IN THE MORNINGS AND 1 TABLET IN THE EVENINGS. 180 tablet 1   glucose blood test strip Use as instructed 100 each 12   lisinopril (ZESTRIL) 20 MG tablet Take 1 tablet (20 mg total) by mouth in the morning and at bedtime. 180 tablet 1   loperamide (IMODIUM A-D) 2 MG tablet Take 2 mg by mouth 4 (four) times daily as needed for diarrhea or loose stools.     loratadine (CLARITIN) 10 MG tablet Take 10 mg by mouth daily.     Magnesium Oxide 200 MG TABS Take 200 mg by mouth in the morning and at bedtime.     naproxen sodium (ALEVE) 220 MG tablet Take 440 mg by mouth in the morning and at bedtime.     ondansetron (ZOFRAN) 8 MG tablet Take 1 tablet (8 mg total) by mouth daily as needed for nausea or vomiting. 30 tablet 2   pantoprazole (PROTONIX) 40 MG tablet Take 1 tablet (40 mg total) by mouth 2 (two) times daily before a meal. 60 tablet 1   Potassium 99 MG TABS Take 99 mg by mouth 2 (two) times daily.     rOPINIRole (REQUIP) 0.5 MG tablet TAKE 1 TABLET (0.5 MG TOTAL) BY MOUTH AT BEDTIME. 90 tablet  1   sildenafil (VIAGRA) 50 MG tablet Take 50 mg by mouth daily as needed for erectile dysfunction.     traMADol (ULTRAM) 50 MG tablet Take 1 tablet (50 mg total) by mouth every 12 (twelve) hours as needed 60 tablet 2   vitamin B-12 (CYANOCOBALAMIN) 1000 MCG tablet Take 1,000 mcg by mouth daily.     No current facility-administered medications for this visit.    SURGICAL HISTORY:  Past Surgical History:  Procedure Laterality Date   BACK SURGERY  1993   lumbar   BIOPSY  02/07/2019   Procedure: BIOPSY;  Surgeon: Danie Binder, MD;  [facility];  Service: Endoscopy;;   BIOPSY  01/30/2022   Procedure: BIOPSY;  Surgeon: Eloise Harman, DO;  [facility];  Service: Endoscopy;;    CERVICAL SPINE SURGERY  2000   COLONOSCOPY N/A 02/07/2019   Dr. Oneida Alar: External hemorrhoids next colonoscopy in 10 years   ESOPHAGOGASTRODUODENOSCOPY N/A 02/07/2019   Dr. Oneida Alar: Barrett's esophagus without dysplasia chronic inactive gastritis but no H. 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, nonbleeding duodenal diverticulum,   ESOPHAGOGASTRODUODENOSCOPY N/A 01/05/2020   Procedure: ESOPHAGOGASTRODUODENOSCOPY (EGD);  Surgeon: Danie Binder, MD;  [facility];  Service: Endoscopy;  Laterality: N/A;  10:30am   ESOPHAGOGASTRODUODENOSCOPY (EGD) WITH PROPOFOL N/A 12/06/2020   Procedure: ESOPHAGOGASTRODUODENOSCOPY (EGD) WITH PROPOFOL;  Surgeon: Eloise Harman, DO;  [facility];  Service: Endoscopy;  Laterality: N/A;  11:45am   ESOPHAGOGASTRODUODENOSCOPY (EGD) WITH PROPOFOL N/A 12/22/2021   long-segment Barrett's s/p biopsy, gastric bypass with normal sized puch and intact staple line. GJ anastomosis with healthy mucosa. Gastritis.   ESOPHAGOGASTRODUODENOSCOPY (EGD) WITH PROPOFOL N/A 01/30/2022   Procedure: ESOPHAGOGASTRODUODENOSCOPY (EGD) WITH PROPOFOL;  Surgeon: Eloise Harman, DO;  [facility];  Service: Endoscopy;  Laterality: N/A;  11:00am   GASTROJEJUNOSTOMY N/A 01/03/2021   Procedure: GASTROJEJUNOSTOMY;  Surgeon: Aviva Signs, MD;  [facility]: [facility];  Service: General;  Laterality: N/A;   HAND SURGERY     SAVORY DILATION N/A 01/05/2020   Procedure: SAVORY DILATION;  Surgeon: Danie Binder, MD;  [facility];  Service: Endoscopy;  Laterality: N/A;    REVIEW OF SYSTEMS:  A comprehensive review of systems was negative except for: Constitutional: positive for fatigue   PHYSICAL EXAMINATION: General appearance: alert, cooperative, fatigued, and no distress Head: Normocephalic, without obvious abnormality, atraumatic Neck: no adenopathy, no JVD, supple, symmetrical, trachea midline, and  thyroid not enlarged, symmetric, no tenderness/mass/nodules Lymph nodes: Cervical, supraclavicular, and axillary nodes normal. Resp: clear to auscultation bilaterally Back: symmetric, no curvature. ROM normal. No CVA tenderness. Cardio: regular rate and rhythm, S1, S2 normal, no murmur, click, rub or gallop GI: soft, non-tender; bowel sounds normal; no masses,  no organomegaly Extremities: extremities normal, atraumatic, no cyanosis or edema  ECOG PERFORMANCE STATUS: 1 - Symptomatic but completely ambulatory  Blood pressure (!) 143/68, pulse 73, temperature 98.6 F (37 C), temperature source Oral, resp. rate 15, height _0  (1.778 m), weight 211 lb 1.6 oz (95.8 kg), SpO2 100 %.  LABORATORY DATA: Lab Results  Component Value Date   WBC 4.9 08/30/2022   HGB 10.2 (L) 08/30/2022   HCT 32.5 (L) 08/30/2022   MCV 92.3 08/30/2022   PLT 209 08/30/2022      Chemistry      Component Value Date/Time   NA 139 02/14/2022 1016   NA 138 05/17/2021 1657  K 3.9 02/14/2022 1016   CL 105 02/14/2022 1016   CO2 27 02/14/2022 1016   BUN 13 02/14/2022 1016   BUN 11 05/17/2021 1657   CREATININE 0.80 07/12/2022 1553   CREATININE 0.72 10/17/2016 1012      Component Value Date/Time   CALCIUM 8.9 02/14/2022 1016   ALKPHOS 111 05/22/2022 1552   AST 32 05/22/2022 1552   ALT 33 05/22/2022 1552   BILITOT <0.2 05/22/2022 1552       RADIOGRAPHIC STUDIES: No results found.  ASSESSMENT AND PLAN: This is a very pleasant 61 years old white male with iron deficiency anemia secondary to malabsorption secondary to gastrojejunostomy in March 2022. The patient was treated with iron infusion with Venofer 300 mg IV for 3 doses.  Last dose was given in November 2022. The patient is currently on over-the-counter iron tablet with vitamin C twice daily. The patient has been tolerating his treatment session well with no concerning adverse effects. CBC today showed further decline in his hemoglobin down to 10.2  and hematocrit of 32.5%. Iron study and ferritin are still pending. I recommended for the patient to continue on the oral iron tablets for now but if the pending iron studies showed significant deficiency, I will arrange for the patient to receive iron infusion with Venofer. I will see him back for follow-up visit in 3 months for evaluation with repeat blood work. The patient was advised to call immediately if he has any other concerning symptoms in the interval. All questions were answered. The patient knows to call the clinic with any problems, questions or concerns. We can certainly see the patient much sooner if necessary. The total time spent in the appointment was 20 minutes.  Disclaimer: This note was dictated with voice recognition software. Similar sounding words can inadvertently be transcribed and may not be corrected upon review.

## 2022-08-31 ENCOUNTER — Telehealth: Payer: Self-pay | Admitting: Pharmacy Technician

## 2022-08-31 NOTE — Telephone Encounter (Addendum)
Dr. Arbutus Ped, [place]ain Childes note:  Please sign product replacement form and fax back to me. Forms has been faxed to [phone]  Patient has been approved for Venofer PAP (Free Drug) Once treatments are complete replacement form must be faxed to receive replacement of medication. .Product Replacement Phone: [phone] FAX: [phone] Approved: 12/01/21 - 11/30/22 ID: [id number]     Patient will be scheduled as soon as possible.

## 2022-09-01 ENCOUNTER — Other Ambulatory Visit: Payer: Self-pay | Admitting: Pharmacy Technician

## 2022-09-11 ENCOUNTER — Ambulatory Visit (INDEPENDENT_AMBULATORY_CARE_PROVIDER_SITE_OTHER): Payer: Self-pay

## 2022-09-11 VITALS — BP 129/66 | HR 75 | Temp 98.2°F | Resp 18 | Ht 70.0 in | Wt 214.0 lb

## 2022-09-11 DIAGNOSIS — D509 Iron deficiency anemia, unspecified: Secondary | ICD-10-CM

## 2022-09-11 MED ORDER — SODIUM CHLORIDE 0.9 % IV SOLN
300.0000 mg | INTRAVENOUS | Status: DC
  Administered 2022-09-11: 300 mg via INTRAVENOUS
  Filled 2022-09-11: qty 15

## 2022-09-11 MED ORDER — ACETAMINOPHEN 325 MG PO TABS: 650.0000 mg | ORAL_TABLET | Freq: Once | ORAL | Status: DC

## 2022-09-11 MED ORDER — DIPHENHYDRAMINE HCL 25 MG PO CAPS: 25.0000 mg | ORAL_CAPSULE | Freq: Once | ORAL | Status: DC

## 2022-09-11 NOTE — Progress Notes (Signed)
Diagnosis: Iron Deficiency Anemia  Provider:  Chilton Greathouse MD  Procedure: Infusion  IV Type: Peripheral, IV Location: R Antecubital  Venofer (Iron Sucrose), Dose: 300 mg  Infusion Start Time: 1402  Infusion Stop Time: 1548  Post Infusion IV Care: Patient declined observation and Peripheral IV Discontinued  Discharge: Condition: Good, Destination: Home . AVS provided to patient.   Performed by:  Evelena Peat, RN

## 2022-09-14 ENCOUNTER — Ambulatory Visit: Payer: Self-pay | Attending: Internal Medicine | Admitting: Internal Medicine

## 2022-09-14 ENCOUNTER — Encounter: Payer: Self-pay | Admitting: Internal Medicine

## 2022-09-14 VITALS — BP 133/65 | HR 75 | Wt 216.4 lb

## 2022-09-14 DIAGNOSIS — E1169 Type 2 diabetes mellitus with other specified complication: Secondary | ICD-10-CM

## 2022-09-14 DIAGNOSIS — E1159 Type 2 diabetes mellitus with other circulatory complications: Secondary | ICD-10-CM

## 2022-09-14 DIAGNOSIS — E669 Obesity, unspecified: Secondary | ICD-10-CM

## 2022-09-14 DIAGNOSIS — D508 Other iron deficiency anemias: Secondary | ICD-10-CM

## 2022-09-14 DIAGNOSIS — Z23 Encounter for immunization: Secondary | ICD-10-CM

## 2022-09-14 DIAGNOSIS — I152 Hypertension secondary to endocrine disorders: Secondary | ICD-10-CM

## 2022-09-14 DIAGNOSIS — R233 Spontaneous ecchymoses: Secondary | ICD-10-CM

## 2022-09-14 LAB — POCT GLYCOSYLATED HEMOGLOBIN (HGB A1C): HbA1c, POC (controlled diabetic range): 4.9 % (ref 0.0–7.0)

## 2022-09-14 LAB — GLUCOSE, POCT (MANUAL RESULT ENTRY): POC Glucose: 233 mg/dl — AB (ref 70–99)

## 2022-09-14 NOTE — Progress Notes (Signed)
Patient ID: Jeremy [name], male    DOB: [birth year]  MRN: [id number]  CC: chronic ds managment  Subjective: Jeremy [name] is a 61 y.o. male who presents for chronic ds management His concerns today include:  Pt with hx of DM, HL, HTN,GERD, IDA (Barrett's esophagus and moderate gastritis thought to be NSAID induced on EGD; iron infusions by Dr. 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  Myoview neg 02/2021   DM: Results for orders placed or performed in visit on 09/14/22  POCT glucose (manual entry)  Result Value Ref Range   POC Glucose 233 (A) 70 - 99 mg/dl  POCT glycosylated hemoglobin (Hb A1C)  Result Value Ref Range   Hemoglobin A1C     HbA1c POC (<> result, manual entry)     HbA1c, POC (prediabetic range)     HbA1c, POC (controlled diabetic range) 4.9 0.0 - 7.0 %  -not checking BS Compliant with taking Glucotrol 5 mg BID Wgh down 27lbs since March of this yr.  Had gastric bypass surgery 12/2021.  He does not like the fact that it was a by-pass procedure.  In any event, he would like to get down to about 180 lbs.  Not exercising much Due for eye exam.  No insurance.  HTN: taking Lisinopril 20 mg BID Has not check BP recently.  Limits salt in foods  IDA: Has been following up with Dr. Julien [name].  Still has iron deficiency anemia with hemoglobin around 10.  Ferritin and iron levels are low.  He had another iron transfusion earlier this week and has 2 more scheduled.  He is up-to-date with colonoscopy.  Had EGD earlier this year given history of Barrett's esophagus.  He is taking oral iron supplement.  He is supposed to be on pantoprazole twice a day but feels that food sits on his stomach much longer when he takes it every day.  He is taking it 3 times a week instead.  Complains of easy bruising on his arms if he bumps into anything.  He is not on  aspirin.  Not on any SSRIs.  Platelet count on recent CBC normal.  Patient Active Problem List   Diagnosis Date Noted   Upper abdominal pain 07/06/2022   Nausea and vomiting 07/06/2022   Hyponatremia 06/23/2021   Low serum vitamin B12 03/23/2021   Vitamin D deficiency 03/23/2021   S/P bypass gastrojejunostomy 01/03/2021   Gastric outlet obstruction    Duodenal stricture    Primary osteoarthritis of both hips 11/15/2020   Aortic atherosclerosis ([place]) 08/30/2020   Coronary artery calcification 08/30/2020   Bilateral carpal tunnel syndrome 06/07/2020   Grief reaction 06/07/2020   Pyloric stenosis in adult 02/05/2020   Congenital hypertrophic pyloric stenosis    Duodenal web    Barrett's esophagus without dysplasia 05/19/2019   OSA (obstructive sleep apnea) 04/30/2019   Iron deficiency anemia 11/25/2018   Phimosis of penis 11/19/2018   Esophageal dysphagia 08/19/2018   Benign prostatic hyperplasia with post-void dribbling 01/31/2018   Absence of bladder continence 01/31/2018   Obesity (BMI 30-39.9) 11/05/2017   GERD (gastroesophageal reflux disease) 06/03/2017   Chronic pain of both knees 06/03/2017   Chronic lower back pain 06/03/2017   Type 2 diabetes mellitus without complication, without long-term current use of insulin ([place]) 04/12/2016   Essential hypertension 04/12/2016     Current Outpatient  Medications on File Prior to Visit  Medication Sig Dispense Refill   acetaminophen (TYLENOL) 500 MG tablet Take 1,000 mg by mouth in the morning and at bedtime.      Blood Glucose Monitoring Suppl (TRUE METRIX METER) w/Device KIT 1 each by Does not apply route 4 (four) times daily -  before meals and at bedtime. 1 kit 0   cyclobenzaprine (FLEXERIL) 10 MG tablet Take 1 tablet (10 mg total) by mouth 2 (two) times daily as needed for muscle spasms. 60 tablet 3   ferrous sulfate (FEROSUL) 325 (65 FE) MG tablet Take 1 tablet (325 mg total) by mouth 2 (two) times daily with a meal. 180 tablet  2   fluticasone (FLONASE) 50 MCG/ACT nasal spray Place 1 spray into both nostrils daily as needed for allergies or rhinitis. 16 g 1   glipiZIDE (GLUCOTROL) 5 MG tablet TAKE 1 TABLET BY MOUTH IN THE MORNINGS AND 1 TABLET IN THE EVENINGS. 180 tablet 1   glucose blood test strip Use as instructed 100 each 12   lisinopril (ZESTRIL) 20 MG tablet Take 1 tablet (20 mg total) by mouth in the morning and at bedtime. 180 tablet 1   loperamide (IMODIUM A-D) 2 MG tablet Take 2 mg by mouth 4 (four) times daily as needed for diarrhea or loose stools.     loratadine (CLARITIN) 10 MG tablet Take 10 mg by mouth daily.     Magnesium Oxide 200 MG TABS Take 200 mg by mouth in the morning and at bedtime.     naproxen sodium (ALEVE) 220 MG tablet Take 440 mg by mouth in the morning and at bedtime.     ondansetron (ZOFRAN) 8 MG tablet Take 1 tablet (8 mg total) by mouth daily as needed for nausea or vomiting. 30 tablet 2   pantoprazole (PROTONIX) 40 MG tablet Take 1 tablet (40 mg total) by mouth 2 (two) times daily before a meal. 60 tablet 1   Potassium 99 MG TABS Take 99 mg by mouth 2 (two) times daily.     rOPINIRole (REQUIP) 0.5 MG tablet TAKE 1 TABLET (0.5 MG TOTAL) BY MOUTH AT BEDTIME. 90 tablet 1   sildenafil (VIAGRA) 50 MG tablet Take 50 mg by mouth daily as needed for erectile dysfunction.     traMADol (ULTRAM) 50 MG tablet Take 1 tablet (50 mg total) by mouth every 12 (twelve) hours as needed 60 tablet 2   vitamin B-12 (CYANOCOBALAMIN) 1000 MCG tablet Take 1,000 mcg by mouth daily.     No current facility-administered medications on file prior to visit.    Allergies  Allergen Reactions   Cymbalta [Duloxetine Hcl] Other (See Comments)    Caused anorgasmia   Doxycycline     Unknown   Other Nausea And Vomiting    Mayonnaise mustard ketchup   Robaxin [Methocarbamol] Other (See Comments)    Had hiccups x 4 hours after taking    Social History   Socioeconomic History   Marital status: Significant  Other    Spouse name: Not on file   Number of children: Not on file   Years of education: Not on file   Highest education level: Not on file  Occupational History   Occupation: general contractor  Tobacco Use   Smoking status: Former    Types: Cigarettes    Quit date: 10/16/1988    Years since quitting: 33.9   Smokeless tobacco: Never  Vaping Use   Vaping Use: Never used  Substance and  Sexual Activity   Alcohol use: No   Drug use: Yes    Comment: rare   Sexual activity: Not on file  Other Topics Concern   Not on file  Social History Narrative   Volunteers as Airline pilot   Social Determinants of Health   Financial Resource Strain: Not on file  Food Insecurity: Not on file  Transportation Needs: Not on file  Physical Activity: Not on file  Stress: Not on file  Social Connections: Not on file  Intimate Partner Violence: Not on file    Family History  Problem Relation Age of Onset   Breast cancer Mother    Pancreatic cancer Mother    CAD Father    Colon cancer Paternal Grandfather     Past Surgical History:  Procedure Laterality Date   BACK SURGERY  1993   lumbar   BIOPSY  02/07/2019   Procedure: BIOPSY;  Surgeon: Danie Binder, MD;  [facility];  Service: Endoscopy;;   BIOPSY  01/30/2022   Procedure: BIOPSY;  Surgeon: Eloise Harman, DO;  [facility];  Service: Endoscopy;;   CERVICAL SPINE SURGERY  2000   COLONOSCOPY N/A 02/07/2019   Dr. Oneida Alar: External hemorrhoids next colonoscopy in 10 years   ESOPHAGOGASTRODUODENOSCOPY N/A 02/07/2019   Dr. Oneida Alar: Barrett's esophagus without dysplasia chronic inactive gastritis but no H. 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, nonbleeding duodenal diverticulum,   ESOPHAGOGASTRODUODENOSCOPY N/A 01/05/2020   Procedure: ESOPHAGOGASTRODUODENOSCOPY (EGD);  Surgeon: Danie Binder, MD;  [facility];  Service: Endoscopy;  Laterality:  N/A;  10:30am   ESOPHAGOGASTRODUODENOSCOPY (EGD) WITH PROPOFOL N/A 12/06/2020   Procedure: ESOPHAGOGASTRODUODENOSCOPY (EGD) WITH PROPOFOL;  Surgeon: Eloise Harman, DO;  [facility];  Service: Endoscopy;  Laterality: N/A;  11:45am   ESOPHAGOGASTRODUODENOSCOPY (EGD) WITH PROPOFOL N/A 12/22/2021   long-segment Barrett's s/p biopsy, gastric bypass with normal sized puch and intact staple line. GJ anastomosis with healthy mucosa. Gastritis.   ESOPHAGOGASTRODUODENOSCOPY (EGD) WITH PROPOFOL N/A 01/30/2022   Procedure: ESOPHAGOGASTRODUODENOSCOPY (EGD) WITH PROPOFOL;  Surgeon: Eloise Harman, DO;  [facility];  Service: Endoscopy;  Laterality: N/A;  11:00am   GASTROJEJUNOSTOMY N/A 01/03/2021   Procedure: GASTROJEJUNOSTOMY;  Surgeon: Aviva Signs, MD;  Location: [facility];  Service: General;  Laterality: N/A;   HAND SURGERY     SAVORY DILATION N/A 01/05/2020   Procedure: SAVORY DILATION;  Surgeon: Danie Binder, MD;  [facility];  Service: Endoscopy;  Laterality: N/A;    ROS: Review of Systems Negative except as stated above  PHYSICAL EXAM: BP 133/65   Pulse 75   Wt 216 lb 6.4 oz (98.2 kg)   SpO2 98%   BMI 31.05 kg/m   Wt Readings from Last 3 Encounters:  09/14/22 216 lb 6.4 oz (98.2 kg)  09/11/22 214 lb (97.1 kg)  08/30/22 211 lb 1.6 oz (95.8 kg)    Physical Exam   General appearance - alert, well appearing, and in no distress Mental status - normal mood, behavior, speech, dress, motor activity, and thought processes Neck - supple, no significant adenopathy Chest - clear to auscultation, no wheezes, rales or rhonchi, symmetric air entry Heart - normal rate, regular rhythm, normal S1, S2, no murmurs, rubs, clicks or gallops Extremities - peripheral pulses normal, no pedal edema, no clubbing or cyanosis Skin: Small areas of resolving ecchymosis noted on the forearms.    Latest Ref Rng &  Units 07/12/2022    3:53 PM 05/22/2022    3:52 PM 02/14/2022    10:16 AM  CMP  Glucose 70 - 99 mg/dL   113   BUN 6 - 23 mg/dL   13   Creatinine 0.61 - 1.24 mg/dL 0.80   0.86   Sodium 135 - 145 mEq/L   139   Potassium 3.5 - 5.1 mEq/L   3.9   Chloride 96 - 112 mEq/L   105   CO2 19 - 32 mEq/L   27   Calcium 8.4 - 10.5 mg/dL   8.9   Total Protein 6.0 - 8.5 g/dL  6.2    Total Bilirubin 0.0 - 1.2 mg/dL  <0.2    Alkaline Phos 44 - 121 IU/L  111    AST 0 - 40 IU/L  32    ALT 0 - 44 IU/L  33     Lipid Panel     Component Value Date/Time   CHOL 87 (L) 05/22/2022 1552   TRIG 64 05/22/2022 1552   HDL 34 (L) 05/22/2022 1552   CHOLHDL 2.6 05/22/2022 1552   CHOLHDL 4.8 10/17/2016 1012   VLDL NOT CALC 10/17/2016 1012   LDLCALC 39 05/22/2022 1552    CBC    Component Value Date/Time   WBC 4.9 08/30/2022 1326   WBC 10.0 03/09/2021 2008   RBC 3.52 (L) 08/30/2022 1326   HGB 10.2 (L) 08/30/2022 1326   HGB 10.0 (L) 05/17/2021 1657   HCT 32.5 (L) 08/30/2022 1326   HCT 31.6 (L) 05/17/2021 1657   PLT 209 08/30/2022 1326   PLT 236 05/17/2021 1657   MCV 92.3 08/30/2022 1326   MCV 89 05/17/2021 1657   MCH 29.0 08/30/2022 1326   MCHC 31.4 08/30/2022 1326   RDW 13.7 08/30/2022 1326   RDW 16.4 (H) 05/17/2021 1657   LYMPHSABS 1.4 08/30/2022 1326   LYMPHSABS 2.0 08/13/2019 1617   MONOABS 0.5 08/30/2022 1326   EOSABS 0.3 08/30/2022 1326   EOSABS 0.4 08/13/2019 1617   BASOSABS 0.0 08/30/2022 1326   BASOSABS 0.1 08/13/2019 1617    ASSESSMENT AND PLAN:  1. Type 2 diabetes mellitus with obesity (HCC) A1c is at goal.  However sometimes it may not be accurate in persons with anemia.  I recommend that he checks his blood sugars at least daily for the next 2 to 3 weeks alternating before breakfast and before dinner to make sure his blood sugars are good.  Blood sugar today in the office in the 200s.  He states that he ate an ice cream Sunday today before coming. Encouraged him to add some exercise a few times a week and to eat smaller but more frequent meals.   This will aid in further weight loss with his goal to get to about 180 pounds. Advised to get diabetic eye exam when he can afford.  Encouraged him to apply for Medicaid as it has Medicaid expansion has occurred in our state.  He states he will look into it. - POCT glucose (manual entry) - POCT glycosylated hemoglobin (Hb A1C)  2. Hypertension associated with type 2 diabetes mellitus ([place]) Close to goal.  Continue lisinopril.  3. Other iron deficiency anemia Followed by hematology.  Receives iron infusion.  Also on oral iron.  Iron deficiency thought to be due to to malabsorption from bypass surgery.  4. Need for immunization against influenza - Flu Vaccine QUAD 35moIM (Fluarix, Fluzone & Alfiuria Quad PF)  5. Easy bruising - PT  AND PTT    Patient was given the opportunity to ask questions.  Patient verbalized understanding of the plan and was able to repeat key elements of the plan.   This documentation was completed using Radio producer.  Any transcriptional errors are unintentional.  Orders Placed This Encounter  Procedures   POCT glucose (manual entry)   POCT glycosylated hemoglobin (Hb A1C)     Requested Prescriptions    No prescriptions requested or ordered in this encounter    No follow-ups on file.  Karle Plumber, MD, FACP

## 2022-09-15 ENCOUNTER — Other Ambulatory Visit: Payer: Self-pay

## 2022-09-15 ENCOUNTER — Ambulatory Visit: Payer: Self-pay | Attending: Family Medicine

## 2022-09-15 DIAGNOSIS — R233 Spontaneous ecchymoses: Secondary | ICD-10-CM | POA: Diagnosis not present

## 2022-09-15 DIAGNOSIS — D500 Iron deficiency anemia secondary to blood loss (chronic): Secondary | ICD-10-CM

## 2022-09-16 LAB — PT AND PTT
INR: 1.1 (ref 0.9–1.2)
Prothrombin Time: 11.4 s (ref 9.1–12.0)
aPTT: 27 s (ref 24–33)

## 2022-09-18 ENCOUNTER — Ambulatory Visit (INDEPENDENT_AMBULATORY_CARE_PROVIDER_SITE_OTHER): Payer: Self-pay

## 2022-09-18 ENCOUNTER — Telehealth: Payer: Self-pay | Admitting: Internal Medicine

## 2022-09-18 VITALS — BP 115/65 | HR 73 | Temp 98.1°F | Resp 18 | Ht 70.0 in | Wt 215.0 lb

## 2022-09-18 DIAGNOSIS — D509 Iron deficiency anemia, unspecified: Secondary | ICD-10-CM

## 2022-09-18 MED ORDER — SODIUM CHLORIDE 0.9 % IV SOLN
300.0000 mg | INTRAVENOUS | Status: DC
  Administered 2022-09-18: 300 mg via INTRAVENOUS
  Filled 2022-09-18: qty 15

## 2022-09-18 MED ORDER — ACETAMINOPHEN 325 MG PO TABS: 650.0000 mg | ORAL_TABLET | Freq: Once | ORAL | Status: DC

## 2022-09-18 MED ORDER — DIPHENHYDRAMINE HCL 25 MG PO CAPS: 25.0000 mg | ORAL_CAPSULE | Freq: Once | ORAL | Status: DC

## 2022-09-18 NOTE — Telephone Encounter (Signed)
Called patient regarding February appointment, patient is notified. 

## 2022-09-18 NOTE — Progress Notes (Signed)
Diagnosis: Iron Deficiency Anemia  Provider:  Chilton Greathouse MD  Procedure: Infusion  IV Type: Peripheral, IV Location: R Antecubital  Venofer (Iron Sucrose), Dose: 300 mg  Infusion Start Time: 1319  Infusion Stop Time: 1503  Post Infusion IV Care: Patient declined observation and Peripheral IV Discontinued  Discharge: Condition: Good, Destination: Home . AVS provided to patient.   Performed by:  Evelena Peat, RN

## 2022-09-19 ENCOUNTER — Other Ambulatory Visit: Payer: Self-pay

## 2022-09-25 ENCOUNTER — Other Ambulatory Visit: Payer: Self-pay | Admitting: Internal Medicine

## 2022-09-25 ENCOUNTER — Other Ambulatory Visit: Payer: Self-pay

## 2022-09-25 ENCOUNTER — Encounter: Payer: Self-pay | Admitting: Internal Medicine

## 2022-09-25 ENCOUNTER — Ambulatory Visit (INDEPENDENT_AMBULATORY_CARE_PROVIDER_SITE_OTHER): Payer: Self-pay | Admitting: *Deleted

## 2022-09-25 ENCOUNTER — Encounter: Payer: Self-pay | Admitting: Physician Assistant

## 2022-09-25 VITALS — BP 119/62 | HR 84 | Temp 97.7°F | Resp 18 | Ht 70.0 in | Wt 215.2 lb

## 2022-09-25 DIAGNOSIS — D509 Iron deficiency anemia, unspecified: Secondary | ICD-10-CM

## 2022-09-25 MED ORDER — TRAMADOL HCL 50 MG PO TABS
50.0000 mg | ORAL_TABLET | Freq: Two times a day (BID) | ORAL | 1 refills | Status: DC | PRN
  Filled 2022-09-25 – 2022-10-12 (×2): qty 60, 30d supply, fill #0
  Filled 2022-11-05 – 2022-11-10 (×2): qty 60, 30d supply, fill #1

## 2022-09-25 MED ORDER — DIPHENHYDRAMINE HCL 25 MG PO CAPS: 25.0000 mg | ORAL_CAPSULE | Freq: Once | ORAL | Status: DC

## 2022-09-25 MED ORDER — ACETAMINOPHEN 325 MG PO TABS: 650.0000 mg | ORAL_TABLET | Freq: Once | ORAL | Status: DC

## 2022-09-25 MED ORDER — SODIUM CHLORIDE 0.9 % IV SOLN
300.0000 mg | INTRAVENOUS | Status: DC
  Administered 2022-09-25: 300 mg via INTRAVENOUS
  Filled 2022-09-25: qty 15

## 2022-09-25 NOTE — Telephone Encounter (Signed)
Requested medication (s) are due for refill today - yes  Requested medication (s) are on the active medication list -yes  Future visit scheduled -yes  Last refill: 06/20/22 #60 2RF  Notes to clinic: non delegated Rx  Requested Prescriptions  Pending Prescriptions Disp Refills   traMADol (ULTRAM) 50 MG tablet 60 tablet 2    Sig: Take 1 tablet (50 mg total) by mouth every 12 (twelve) hours as needed     Not Delegated - Analgesics:  Opioid Agonists Failed - 09/25/2022  2:06 AM      Failed - This refill cannot be delegated      Failed - Urine Drug Screen completed in last 360 days      Passed - Valid encounter within last 3 months    Recent Outpatient Visits           1 week ago Type 2 diabetes mellitus with obesity (HCC)   [place] [facility] Marcine Matar, MD   3 months ago Right elbow pain   [place] [facility] [place], [place] W, NP   4 months ago Type 2 diabetes mellitus with obesity [facility])   [place] [facility] Marcine Matar, MD   4 months ago Patient left before evaluation by physician   [facility] Marcine Matar, MD   7 months ago Bloating symptom   [place] [facility] Marcine Matar, MD       Future Appointments             In 3 months Marcine Matar, MD [facility]               Requested Prescriptions  Pending Prescriptions Disp Refills   traMADol (ULTRAM) 50 MG tablet 60 tablet 2    Sig: Take 1 tablet (50 mg total) by mouth every 12 (twelve) hours as needed     Not Delegated - Analgesics:  Opioid Agonists Failed - 09/25/2022  2:06 AM      Failed - This refill cannot be delegated      Failed - Urine Drug Screen completed in last 360 days      Passed - Valid encounter within last 3 months    Recent Outpatient Visits           1 week ago Type 2 diabetes mellitus with obesity (HCC)    [place] [facility] Marcine Matar, MD   3 months ago Right elbow pain   [place] [facility] [place], [place] W, NP   4 months ago Type 2 diabetes mellitus with obesity [facility])   [place] [facility] Marcine Matar, MD   4 months ago Patient left before evaluation by physician   [facility] Marcine Matar, MD   7 months ago Bloating symptom   [facility] Marcine Matar, MD       Future Appointments             In 3 months Laural Benes, Binnie Rail, MD [facility] [place] [facility]

## 2022-09-25 NOTE — Progress Notes (Signed)
Diagnosis: Iron Deficiency Anemia  Provider:  Chilton Greathouse MD  Procedure: Infusion  IV Type: Peripheral, IV Location: R Antecubital  Venofer (Iron Sucrose), Dose: 300 mg  Infusion Start Time: 1313 pm  Infusion Stop Time: 1447 pm  Post Infusion IV Care: Observation period completed and Peripheral IV Discontinued  Discharge: Condition: Good, Destination: Home . AVS provided to patient.   Performed by:  Forrest Moron, RN

## 2022-09-26 ENCOUNTER — Other Ambulatory Visit: Payer: Self-pay

## 2022-10-02 ENCOUNTER — Other Ambulatory Visit: Payer: Self-pay

## 2022-10-03 ENCOUNTER — Other Ambulatory Visit: Payer: Self-pay

## 2022-10-05 ENCOUNTER — Ambulatory Visit: Payer: Self-pay | Admitting: Gastroenterology

## 2022-10-06 ENCOUNTER — Other Ambulatory Visit: Payer: Self-pay

## 2022-10-12 ENCOUNTER — Encounter: Payer: Self-pay | Admitting: Physician Assistant

## 2022-10-12 ENCOUNTER — Encounter: Payer: Self-pay | Admitting: Internal Medicine

## 2022-10-12 ENCOUNTER — Other Ambulatory Visit: Payer: Self-pay

## 2022-10-13 ENCOUNTER — Encounter: Payer: Self-pay | Admitting: Internal Medicine

## 2022-10-13 ENCOUNTER — Other Ambulatory Visit: Payer: Self-pay

## 2022-10-13 ENCOUNTER — Encounter: Payer: Self-pay | Admitting: Physician Assistant

## 2022-10-17 ENCOUNTER — Other Ambulatory Visit: Payer: Self-pay

## 2022-10-25 ENCOUNTER — Encounter: Payer: Self-pay | Admitting: Gastroenterology

## 2022-10-25 ENCOUNTER — Ambulatory Visit (INDEPENDENT_AMBULATORY_CARE_PROVIDER_SITE_OTHER): Payer: Self-pay | Admitting: Gastroenterology

## 2022-10-25 VITALS — BP 135/73 | HR 61 | Temp 97.1°F | Ht 70.0 in | Wt 217.5 lb

## 2022-10-25 DIAGNOSIS — K2270 Barrett's esophagus without dysplasia: Secondary | ICD-10-CM

## 2022-10-25 DIAGNOSIS — K5900 Constipation, unspecified: Secondary | ICD-10-CM

## 2022-10-25 NOTE — Patient Instructions (Signed)
Continue pantoprazole as you are doing.  I would like for you to try Linzess. Linzess works best when taken once a day every day, on an empty stomach, at least 30 minutes before your first meal of the day.  When Linzess is taken daily as directed:  *Constipation relief is typically felt in about a week *IBS-C patients may begin to experience relief from belly pain and overall abdominal symptoms (pain, discomfort, and bloating) in about 1 week,   with symptoms typically improving over 12 weeks.  Diarrhea may occur in the first 2 weeks -keep taking it.  The diarrhea should go away and you should start having normal, complete, full bowel movements. It may be helpful to start treatment when you can be near the comfort of your own bathroom, such as a weekend.    We will see you in 3 months! Please message with an update. We can adjust Linzess as needed.   I enjoyed seeing you again today! As you know, I value our relationship and want to provide genuine, compassionate, and quality care. I welcome your feedback. If you receive a survey regarding your visit,  I greatly appreciate you taking time to fill this out. See you next time!  Annitta Needs, PhD, ANP-BC [facility]

## 2022-10-25 NOTE — Progress Notes (Signed)
Gastroenterology Office Note     Primary Care Physician:  Marcine Matar, MD  Primary Gastroenterologist: Dr. Marletta Lor    Chief Complaint   Chief Complaint  Patient presents with   Follow-up    Patient here today for a follow up. He says he has diarrhea, alternating with constipation. He says he will take imodium for the diarrhea, and then he will become constipated. Gerd controlled with Pantoprazole 40 mg on Monday and Fridays only.     History of Present Illness   Jeremy [name] is a 62 y.o. 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 Lovell Sheehan on 01/03/2021.     EGD April 2023: long-segment Barrett's s/p biopsy, gastric bypass with normal sized puch and intact staple line. GJ anastomosis with healthy mucosa. Gastritis.  Was on zantac for years, pepcid no improvement. Taking pantoprazole twice a week. Has tried omeprazole without improvement. He states any PPI that he takes daily will cause feeling of "brick'" in his stomach and early satiety.    Will have diarrhea about once a month. Having a BM about once a week. Supplemental fiber without improvement.   Last colonoscopy April 2020.    Past Medical History:  Diagnosis Date   Diabetes mellitus without complication (HCC)    GERD (gastroesophageal reflux disease)    Hyperlipidemia    Hypertension    IDA (iron deficiency anemia)    Rash of entire body 03/2016   Sleep apnea     Past Surgical History:  Procedure Laterality Date   BACK SURGERY  1993   lumbar   BIOPSY  02/07/2019   Procedure: BIOPSY;  Surgeon: West Bali, MD;  [facility];  Service: Endoscopy;;   BIOPSY  01/30/2022   Procedure: BIOPSY;  Surgeon: Lanelle Bal, DO;  [facility];  Service: Endoscopy;;    CERVICAL SPINE SURGERY  2000   COLONOSCOPY N/A 02/07/2019   Dr. Darrick Penna: External hemorrhoids next colonoscopy in 10 years   ESOPHAGOGASTRODUODENOSCOPY N/A 02/07/2019   Dr. Darrick Penna: Barrett's esophagus without dysplasia chronic inactive gastritis but no H. 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, nonbleeding duodenal diverticulum,   ESOPHAGOGASTRODUODENOSCOPY N/A 01/05/2020   Procedure: ESOPHAGOGASTRODUODENOSCOPY (EGD);  Surgeon: West Bali, MD;  [facility];  Service: Endoscopy;  Laterality: N/A;  10:30am   ESOPHAGOGASTRODUODENOSCOPY (EGD) WITH PROPOFOL N/A 12/06/2020   Procedure: ESOPHAGOGASTRODUODENOSCOPY (EGD) WITH PROPOFOL;  Surgeon: Lanelle Bal, DO;  [facility];  Service: Endoscopy;  Laterality: N/A;  11:45am   ESOPHAGOGASTRODUODENOSCOPY (EGD) WITH PROPOFOL N/A 12/22/2021   long-segment Barrett's s/p biopsy, gastric bypass with normal sized puch and intact staple line. GJ anastomosis with healthy mucosa. Gastritis.   ESOPHAGOGASTRODUODENOSCOPY (EGD) WITH PROPOFOL N/A 01/30/2022   Procedure: ESOPHAGOGASTRODUODENOSCOPY (EGD) WITH PROPOFOL;  Surgeon: Lanelle Bal, DO;  [facility];  Service: Endoscopy;  Laterality: N/A;  11:00am   GASTROJEJUNOSTOMY N/A 01/03/2021   Procedure: GASTROJEJUNOSTOMY;  Surgeon: [name], MD;  Location: [facility];  Service: General;  Laterality: N/A;   HAND SURGERY     SAVORY DILATION N/A 01/05/2020   Procedure: SAVORY DILATION;  Surgeon: West Bali, MD;  [facility];  Service: Endoscopy;  Laterality: N/A;    Current Outpatient Medications  Medication Sig Dispense Refill   acetaminophen (TYLENOL) 500 MG tablet Take 1,000 mg by mouth in the morning and at bedtime.      Blood Glucose Monitoring Suppl (TRUE METRIX METER) w/Device KIT 1 each by Does not apply route 4 (four) times daily -  before meals and at bedtime. 1 kit 0   cyclobenzaprine  (FLEXERIL) 10 MG tablet Take 1 tablet (10 mg total) by mouth 2 (two) times daily as needed for muscle spasms. 60 tablet 3   ferrous sulfate (FEROSUL) 325 (65 FE) MG tablet Take 1 tablet (325 mg total) by mouth 2 (two) times daily with a meal. 180 tablet 2   fluticasone (FLONASE) 50 MCG/ACT nasal spray Place 1 spray into both nostrils daily as needed for allergies or rhinitis. 16 g 1   glipiZIDE (GLUCOTROL) 5 MG tablet TAKE 1 TABLET BY MOUTH IN THE MORNINGS AND 1 TABLET IN THE EVENINGS. 180 tablet 1   glucose blood test strip Use as instructed 100 each 12   lisinopril (ZESTRIL) 20 MG tablet Take 1 tablet (20 mg total) by mouth in the morning and at bedtime. 180 tablet 1   loperamide (IMODIUM A-D) 2 MG tablet Take 2 mg by mouth 4 (four) times daily as needed for diarrhea or loose stools.     loratadine (CLARITIN) 10 MG tablet Take 10 mg by mouth daily.     Magnesium Oxide 200 MG TABS Take 200 mg by mouth in the morning and at bedtime.     naproxen sodium (ALEVE) 220 MG tablet Take 440 mg by mouth in the morning and at bedtime.     ondansetron (ZOFRAN) 8 MG tablet Take 1 tablet (8 mg total) by mouth daily as needed for nausea or vomiting. 30 tablet 2   pantoprazole (PROTONIX) 40 MG tablet Take 1 tablet (40 mg total) by mouth 2 (two) times daily before a meal. 60 tablet 1   Potassium 99 MG TABS Take 99 mg by mouth daily at 6 (six) AM.     rOPINIRole (REQUIP) 0.5 MG tablet TAKE 1 TABLET (0.5 MG TOTAL) BY MOUTH AT BEDTIME. 90 tablet 1   sildenafil (VIAGRA) 50 MG tablet Take 50 mg by mouth daily as needed for erectile dysfunction.     traMADol (ULTRAM) 50 MG tablet Take 1 tablet (50 mg total) by mouth every 12 (twelve) hours as needed 60 tablet 1   vitamin B-12 (CYANOCOBALAMIN) 1000 MCG tablet Take 1,000 mcg by mouth daily.     No current facility-administered medications for this visit.    Allergies as of 10/25/2022 - Review Complete 10/25/2022  Allergen Reaction Noted   Cymbalta [duloxetine hcl]  Other (See Comments) 06/05/2017   Doxycycline  03/02/2021   Other Nausea And Vomiting 01/24/2019   Robaxin [methocarbamol] Other (See Comments) 06/02/2019    Family History  Problem Relation Age of Onset   Breast cancer Mother    Pancreatic cancer Mother    CAD Father    Colon cancer Paternal Grandfather     Social History   Socioeconomic History   Marital status: Significant Other    Spouse name: Not on file   Number of children: Not on file   Years of education: Not on file   Highest education level: Not on file  Occupational History   Occupation: general contractor  Tobacco Use   Smoking status: Former    Types: Cigarettes    Quit date: 10/16/1988    Years since quitting: 34.0   Smokeless  tobacco: Never  Vaping Use   Vaping Use: Never used  Substance and Sexual Activity   Alcohol use: No   Drug use: Yes    Comment: rare   Sexual activity: Not on file  Other Topics Concern   Not on file  Social History Narrative   Volunteers as Airline pilot   Social Determinants of Health   Financial Resource Strain: Not on file  Food Insecurity: Not on file  Transportation Needs: Not on file  Physical Activity: Not on file  Stress: Not on file  Social Connections: Not on file  Intimate Partner Violence: Not on file     Review of Systems   Gen: Denies any fever, chills, fatigue, weight loss, lack of appetite.  CV: Denies chest pain, heart palpitations, peripheral edema, syncope.  Resp: Denies shortness of breath at rest or with exertion. Denies wheezing or cough.  GI: Denies dysphagia or odynophagia. Denies jaundice, hematemesis, fecal incontinence. GU : Denies urinary burning, urinary frequency, urinary hesitancy MS: Denies joint pain, muscle weakness, cramps, or limitation of movement.  Derm: Denies rash, itching, dry skin Psych: Denies depression, anxiety, memory loss, and confusion Heme: Denies bruising, bleeding, and enlarged lymph nodes.   Physical Exam   BP  135/73 (BP Location: Left Arm, Patient Position: Sitting, Cuff Size: Large)   Pulse 61   Temp (!) 97.1 F (36.2 C) (Temporal)   Ht 5\' 10"  (1.778 m)   Wt 217 lb 8 oz (98.7 kg)   BMI 31.21 kg/m  General:   Alert and oriented. Pleasant and cooperative. Well-nourished and well-developed.  Head:  Normocephalic and atraumatic. Eyes:  Without icterus Abdomen:  +BS, soft, non-tender and non-distended. No HSM noted. No guarding or rebound. No masses appreciated.  Rectal:  Deferred  Msk:  Symmetrical without gross deformities. Normal posture. Extremities:  Without edema. Neurologic:  Alert and  oriented x4;  grossly normal neurologically. Skin:  Intact without significant lesions or rashes. Psych:  Alert and cooperative. Normal mood and affect.   Assessment   Jeremy [name] is a 62 y.o. 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 Arnoldo Morale on 01/03/2021.    Constipation: will need to trial prescriptive agent. Intermittent diarrhea is likely overflow. Start Linzess 145 mcg daily. May need to titrate up.  Barrett's: he  has been unable to tolerate PPIs on a daily basis due to interesting reports of feeling like a "brick" in stomach. No dysphagia. Doing well at this time but only taking PPI twice a week. Will discuss with Dr. Abbey Chatters.      PLAN    Recommend PPI daily if tolerated Linzess 145 mcg trial Return in 3 months    Annitta Needs, PhD, [facility] Gastroenterology

## 2022-10-31 ENCOUNTER — Encounter: Payer: Self-pay | Admitting: Internal Medicine

## 2022-11-05 ENCOUNTER — Other Ambulatory Visit: Payer: Self-pay | Admitting: Internal Medicine

## 2022-11-05 DIAGNOSIS — M62838 Other muscle spasm: Secondary | ICD-10-CM

## 2022-11-06 ENCOUNTER — Other Ambulatory Visit: Payer: Self-pay

## 2022-11-06 ENCOUNTER — Encounter: Payer: Self-pay | Admitting: Physician Assistant

## 2022-11-06 ENCOUNTER — Encounter: Payer: Self-pay | Admitting: Internal Medicine

## 2022-11-07 ENCOUNTER — Other Ambulatory Visit: Payer: Self-pay

## 2022-11-07 ENCOUNTER — Encounter: Payer: Self-pay | Admitting: Physician Assistant

## 2022-11-07 ENCOUNTER — Encounter: Payer: Self-pay | Admitting: Internal Medicine

## 2022-11-07 MED ORDER — CYCLOBENZAPRINE HCL 10 MG PO TABS
10.0000 mg | ORAL_TABLET | Freq: Two times a day (BID) | ORAL | 3 refills | Status: DC | PRN
  Filled 2022-11-07 – 2022-11-21 (×2): qty 60, 30d supply, fill #0
  Filled 2022-12-25: qty 60, 30d supply, fill #1
  Filled 2023-01-29: qty 60, 30d supply, fill #2
  Filled 2023-02-26: qty 60, 30d supply, fill #3

## 2022-11-07 MED ORDER — ROPINIROLE HCL 0.5 MG PO TABS
0.5000 mg | ORAL_TABLET | Freq: Every day | ORAL | 0 refills | Status: DC
  Filled 2022-11-07: qty 90, 90d supply, fill #0

## 2022-11-07 NOTE — Telephone Encounter (Signed)
Requested Prescriptions  Pending Prescriptions Disp Refills   rOPINIRole (REQUIP) 0.5 MG tablet 90 tablet 1    Sig: TAKE 1 TABLET (0.5 MG TOTAL) BY MOUTH AT BEDTIME.     Neurology:  Parkinsonian Agents Passed - 11/05/2022 10:50 PM      Passed - Last BP in normal range    BP Readings from Last 1 Encounters:  10/25/22 135/73         Passed - Last Heart Rate in normal range    Pulse Readings from Last 1 Encounters:  10/25/22 61         Passed - Valid encounter within last 12 months    Recent Outpatient Visits           1 month ago Type 2 diabetes mellitus with obesity ([place])   [place] [name], MD   5 months ago Right elbow pain   [place], [place] W, NP   5 months ago Type 2 diabetes mellitus with obesity [facility])   [place] [name], MD   6 months ago Patient left before evaluation by [place] [name], MD   8 months ago Bloating symptom   [place], MD       Future Appointments             In 2 months [name], MD [place]             cyclobenzaprine (FLEXERIL) 10 MG tablet 60 tablet 3    Sig: Take 1 tablet (10 mg total) by mouth 2 (two) times daily as needed for muscle spasms.     Not Delegated - Analgesics:  Muscle Relaxants Failed - 11/05/2022 10:50 PM      Failed - This refill cannot be delegated      Passed - Valid encounter within last 6 months    Recent Outpatient Visits           1 month ago Type 2 diabetes mellitus with obesity [facility])   [place] [name], MD   5 months ago Right elbow pain   [place], [place] W, NP   5 months ago Type 2 diabetes mellitus with obesity  [facility])   [place] [name], MD   6 months ago Patient left before evaluation by [place] [name], MD   8 months ago Bloating symptom   [place], MD       Future Appointments             In 2 months [name], MD [place]

## 2022-11-07 NOTE — Telephone Encounter (Signed)
Requested medication (s) are due for refill today: yes  Requested medication (s) are on the active medication list: yes  Last refill:  06/21/22  Future visit scheduled: yes  Notes to clinic:  Unable to refill per protocol, cannot delegate.      Requested Prescriptions  Pending Prescriptions Disp Refills   cyclobenzaprine (FLEXERIL) 10 MG tablet 60 tablet 3    Sig: Take 1 tablet (10 mg total) by mouth 2 (two) times daily as needed for muscle spasms.     Not Delegated - Analgesics:  Muscle Relaxants Failed - 11/05/2022 10:50 PM      Failed - This refill cannot be delegated      Passed - Valid encounter within last 6 months    Recent Outpatient Visits           1 month ago Type 2 diabetes mellitus with obesity ([place])   [place] [name], MD   5 months ago Right elbow pain   [place], [place] W, NP   5 months ago Type 2 diabetes mellitus with obesity [facility])   [place] [name], MD   6 months ago Patient left before evaluation by [place] [name], MD   8 months ago Bloating symptom   [place], MD       Future Appointments             In 2 months [name], MD [place]            Signed Prescriptions Disp Refills   rOPINIRole (REQUIP) 0.5 MG tablet 90 tablet 0    Sig: TAKE 1 TABLET (0.5 MG TOTAL) BY MOUTH AT BEDTIME.     Neurology:  Parkinsonian Agents Passed - 11/05/2022 10:50 PM      Passed - Last BP in normal range    BP Readings from Last 1 Encounters:  10/25/22 135/73         Passed - Last Heart Rate in normal range    Pulse Readings from Last 1 Encounters:  10/25/22 61         Passed - Valid encounter within last 12 months    Recent Outpatient Visits            1 month ago Type 2 diabetes mellitus with obesity ([place])   [place] [name], MD   5 months ago Right elbow pain   [place], [place] W, NP   5 months ago Type 2 diabetes mellitus with obesity [facility])   [place] [name], MD   6 months ago Patient left before evaluation by [place] [name], MD   8 months ago Bloating symptom   [place], MD       Future Appointments             In 2 months [name], MD [place]

## 2022-11-09 ENCOUNTER — Other Ambulatory Visit: Payer: Self-pay

## 2022-11-10 ENCOUNTER — Other Ambulatory Visit: Payer: Self-pay

## 2022-11-10 ENCOUNTER — Encounter: Payer: Self-pay | Admitting: Internal Medicine

## 2022-11-10 ENCOUNTER — Encounter: Payer: Self-pay | Admitting: Physician Assistant

## 2022-11-21 ENCOUNTER — Encounter: Payer: Self-pay | Admitting: Physician Assistant

## 2022-11-21 ENCOUNTER — Other Ambulatory Visit: Payer: Self-pay

## 2022-11-21 ENCOUNTER — Encounter: Payer: Self-pay | Admitting: Internal Medicine

## 2022-11-21 ENCOUNTER — Other Ambulatory Visit: Payer: Self-pay | Admitting: Internal Medicine

## 2022-11-21 DIAGNOSIS — E119 Type 2 diabetes mellitus without complications: Secondary | ICD-10-CM

## 2022-11-22 ENCOUNTER — Encounter: Payer: Self-pay | Admitting: Physician Assistant

## 2022-11-22 ENCOUNTER — Other Ambulatory Visit: Payer: Self-pay

## 2022-11-22 ENCOUNTER — Encounter: Payer: Self-pay | Admitting: Internal Medicine

## 2022-11-22 MED ORDER — GLIPIZIDE 5 MG PO TABS
ORAL_TABLET | ORAL | 0 refills | Status: DC
  Filled 2022-11-22: qty 180, 90d supply, fill #0

## 2022-11-22 NOTE — Telephone Encounter (Signed)
Will refill request for glipizide 5 MG, request for ropinirole is too soon. Last refill 10/20/22 for 90 days, will refuse.  Requested Prescriptions  Pending Prescriptions Disp Refills   glipiZIDE (GLUCOTROL) 5 MG tablet 180 tablet 0    Sig: TAKE 1 TABLET BY MOUTH IN THE MORNINGS AND 1 TABLET IN THE EVENINGS.     Endocrinology:  Diabetes - Sulfonylureas Passed - 11/21/2022  3:44 PM      Passed - HBA1C is between 0 and 7.9 and within 180 days    HbA1c, POC (prediabetic range)  Date Value Ref Range Status  09/05/2019 6.3 5.7 - 6.4 % Corrected   HbA1c, POC (controlled diabetic range)  Date Value Ref Range Status  09/14/2022 4.9 0.0 - 7.0 % Final         Passed - Cr in normal range and within 360 days    Creat  Date Value Ref Range Status  10/17/2016 0.72 0.70 - 1.33 mg/dL Final    Comment:      For patients > or = 60 years of age: The upper reference limit for Creatinine is approximately 13% higher for people identified as African-American.      Creatinine, Ser  Date Value Ref Range Status  07/12/2022 0.80 0.61 - 1.24 mg/dL Final   Creatinine, Urine  Date Value Ref Range Status  12/25/2016 190 20 - 370 mg/dL Final         Passed - Valid encounter within last 6 months    Recent Outpatient Visits           2 months ago Type 2 diabetes mellitus with obesity ([place])   [place] [name], MD   5 months ago Right elbow pain   [place], [place] W, NP   6 months ago Type 2 diabetes mellitus with obesity [facility])   [place] [name], MD   6 months ago Patient left before evaluation by [place] [name], MD   9 months ago Bloating symptom   [place], MD       Future Appointments             In 1 month [name], MD [place]             rOPINIRole (REQUIP) 0.5 MG tablet 90 tablet 0    Sig: Take 1 tablet (0.5 mg total) by mouth at bedtime.     Neurology:  Parkinsonian Agents Passed - 11/21/2022  3:44 PM      Passed - Last BP in normal range    BP Readings from Last 1 Encounters:  10/25/22 135/73         Passed - Last Heart Rate in normal range    Pulse Readings from Last 1 Encounters:  10/25/22 61         Passed - Valid encounter within last 12 months    Recent Outpatient Visits           2 months ago Type 2 diabetes mellitus with obesity [facility])   [place] [name], MD   5 months ago Right elbow pain   [place], NP   6 months ago  Type 2 diabetes mellitus with obesity [facility])   [place] [name], MD   6 months ago Patient left before evaluation by [place] [name], MD   9 months ago Bloating symptom   [place], MD       Future Appointments             In 1 month Wynetta Emery, [place], MD [place]

## 2022-11-24 ENCOUNTER — Other Ambulatory Visit: Payer: Self-pay

## 2022-11-24 ENCOUNTER — Telehealth: Payer: Self-pay | Admitting: Internal Medicine

## 2022-11-24 NOTE — Telephone Encounter (Signed)
Called patient regarding rescheduled appointment, patient is notified. ?

## 2022-11-27 ENCOUNTER — Other Ambulatory Visit: Payer: Self-pay

## 2022-11-29 ENCOUNTER — Encounter: Payer: Self-pay | Admitting: Internal Medicine

## 2022-11-30 ENCOUNTER — Ambulatory Visit: Payer: Self-pay | Admitting: Internal Medicine

## 2022-11-30 ENCOUNTER — Other Ambulatory Visit: Payer: Self-pay

## 2022-12-11 ENCOUNTER — Encounter: Payer: Self-pay | Admitting: Internal Medicine

## 2022-12-11 ENCOUNTER — Other Ambulatory Visit: Payer: Self-pay

## 2022-12-11 ENCOUNTER — Other Ambulatory Visit: Payer: Self-pay | Admitting: Internal Medicine

## 2022-12-11 ENCOUNTER — Encounter: Payer: Self-pay | Admitting: Physician Assistant

## 2022-12-11 MED ORDER — TRAMADOL HCL 50 MG PO TABS
50.0000 mg | ORAL_TABLET | Freq: Two times a day (BID) | ORAL | 2 refills | Status: DC | PRN
  Filled 2022-12-11: qty 60, 30d supply, fill #0
  Filled 2022-12-25 – 2023-01-15 (×2): qty 60, 30d supply, fill #1

## 2022-12-12 ENCOUNTER — Encounter: Payer: Self-pay | Admitting: Physician Assistant

## 2022-12-12 ENCOUNTER — Encounter: Payer: Self-pay | Admitting: Internal Medicine

## 2022-12-12 ENCOUNTER — Other Ambulatory Visit: Payer: Self-pay

## 2022-12-14 ENCOUNTER — Inpatient Hospital Stay (HOSPITAL_BASED_OUTPATIENT_CLINIC_OR_DEPARTMENT_OTHER): Payer: Medicaid Other | Admitting: Internal Medicine

## 2022-12-14 ENCOUNTER — Other Ambulatory Visit: Payer: Self-pay

## 2022-12-14 ENCOUNTER — Other Ambulatory Visit: Payer: Self-pay | Admitting: Internal Medicine

## 2022-12-14 ENCOUNTER — Other Ambulatory Visit: Payer: Self-pay | Admitting: *Deleted

## 2022-12-14 ENCOUNTER — Inpatient Hospital Stay: Payer: Medicaid Other | Attending: Internal Medicine

## 2022-12-14 VITALS — BP 131/65 | HR 71 | Temp 98.2°F | Resp 16 | Wt 217.0 lb

## 2022-12-14 DIAGNOSIS — D508 Other iron deficiency anemias: Secondary | ICD-10-CM | POA: Insufficient documentation

## 2022-12-14 DIAGNOSIS — Z9884 Bariatric surgery status: Secondary | ICD-10-CM | POA: Insufficient documentation

## 2022-12-14 DIAGNOSIS — D509 Iron deficiency anemia, unspecified: Secondary | ICD-10-CM

## 2022-12-14 DIAGNOSIS — K912 Postsurgical malabsorption, not elsewhere classified: Secondary | ICD-10-CM | POA: Insufficient documentation

## 2022-12-14 LAB — CMP (CANCER CENTER ONLY)
ALT: 21 U/L (ref 0–44)
AST: 24 U/L (ref 15–41)
Albumin: 3.8 g/dL (ref 3.5–5.0)
Alkaline Phosphatase: 61 U/L (ref 38–126)
Anion gap: 5 (ref 5–15)
BUN: 17 mg/dL (ref 6–20)
CO2: 27 mmol/L (ref 22–32)
Calcium: 8.7 mg/dL — ABNORMAL LOW (ref 8.9–10.3)
Chloride: 104 mmol/L (ref 98–111)
Creatinine: 0.65 mg/dL (ref 0.61–1.24)
GFR, Estimated: >60 mL/min (ref >60)
Glucose, Bld: 87 mg/dL (ref 70–99)
Potassium: 4 mmol/L (ref 3.5–5.1)
Sodium: 136 mmol/L (ref 135–145)
Total Bilirubin: 0.2 mg/dL — ABNORMAL LOW (ref 0.3–1.2)
Total Protein: 5.7 g/dL — ABNORMAL LOW (ref 6.5–8.1)

## 2022-12-14 LAB — CBC WITH DIFFERENTIAL (CANCER CENTER ONLY)
Abs Immature Granulocytes: 0.09 10*3/uL — ABNORMAL HIGH (ref 0.00–0.07)
Basophils Absolute: 0.1 10*3/uL (ref 0.0–0.1)
Basophils Relative: 1 %
Eosinophils Absolute: 0.4 10*3/uL (ref 0.0–0.5)
Eosinophils Relative: 7 %
HCT: 33.9 % — ABNORMAL LOW (ref 39.0–52.0)
Hemoglobin: 11.4 g/dL — ABNORMAL LOW (ref 13.0–17.0)
Immature Granulocytes: 2 %
Lymphocytes Relative: 28 %
Lymphs Abs: 1.6 10*3/uL (ref 0.7–4.0)
MCH: 31.1 pg (ref 26.0–34.0)
MCHC: 33.6 g/dL (ref 30.0–36.0)
MCV: 92.6 fL (ref 80.0–100.0)
Monocytes Absolute: 0.5 10*3/uL (ref 0.1–1.0)
Monocytes Relative: 9 %
Neutro Abs: 3.1 10*3/uL (ref 1.7–7.7)
Neutrophils Relative %: 53 %
Platelet Count: 202 10*3/uL (ref 150–400)
RBC: 3.66 MIL/uL — ABNORMAL LOW (ref 4.22–5.81)
RDW: 13.2 % (ref 11.5–15.5)
WBC Count: 5.8 10*3/uL (ref 4.0–10.5)
nRBC: 0 % (ref 0.0–0.2)

## 2022-12-14 LAB — IRON AND IRON BINDING CAPACITY (CC-WL,HP ONLY)
Iron: 166 ug/dL (ref 45–182)
Saturation Ratios: 51 % — ABNORMAL HIGH (ref 17.9–39.5)
TIBC: 326 ug/dL (ref 250–450)
UIBC: 160 ug/dL (ref 117–376)

## 2022-12-14 LAB — FERRITIN: Ferritin: 13 ng/mL — ABNORMAL LOW (ref 24–336)

## 2022-12-14 NOTE — Progress Notes (Signed)
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  PRIOR THERAPY: Iron infusion with Venofer 300 mg IV weekly for 3 weeks.  CURRENT THERAPY: Over-the-counter oral iron tablet with vitamin C twice daily.  INTERVAL HISTORY: Jeremy [name] 62 y.o. male returns to the clinic today for follow-up visit.  The patient is feeling fine today with no concerning complaints except for mild fatigue.  He has no significant chest pain, shortness of breath, cough or hemoptysis.  He has occasional nausea with no vomiting, diarrhea or constipation.  He has no headache or visual changes.  He continues to tolerate over-the-counter oral iron tablets with vitamin C fairly well.  He received iron infusion at the [facility] infusion center.  The patient is here today for evaluation and repeat blood work.   MEDICAL HISTORY: Past Medical History:  Diagnosis Date   Diabetes mellitus without complication (HCC)    GERD (gastroesophageal reflux disease)    Hyperlipidemia    Hypertension    IDA (iron deficiency anemia)    Rash of entire body 03/2016   Sleep apnea     ALLERGIES:  is allergic to cymbalta [duloxetine hcl], doxycycline, other, and robaxin [methocarbamol].  MEDICATIONS:  Current Outpatient Medications  Medication Sig Dispense Refill   acetaminophen (TYLENOL) 500 MG tablet Take 1,000 mg by mouth in the morning and at bedtime.      Blood Glucose Monitoring Suppl (TRUE METRIX METER) w/Device KIT 1 each by Does not apply route 4 (four) times daily -  before meals and at bedtime. 1 kit 0   cyclobenzaprine (FLEXERIL) 10 MG tablet Take 1 tablet (10 mg total) by mouth 2 (two) times daily as needed for muscle spasms. 60 tablet 3   ferrous sulfate (FEROSUL) 325 (65 FE) MG  tablet Take 1 tablet (325 mg total) by mouth 2 (two) times daily with a meal. 180 tablet 2   fluticasone (FLONASE) 50 MCG/ACT nasal spray Place 1 spray into both nostrils daily as needed for allergies or rhinitis. 16 g 1   glipiZIDE (GLUCOTROL) 5 MG tablet TAKE 1 TABLET BY MOUTH IN THE MORNINGS AND 1 TABLET IN THE EVENINGS. 180 tablet 0   glucose blood test strip Use as instructed 100 each 12   lisinopril (ZESTRIL) 20 MG tablet Take 1 tablet (20 mg total) by mouth in the morning and at bedtime. 180 tablet 1   loperamide (IMODIUM A-D) 2 MG tablet Take 2 mg by mouth 4 (four) times daily as needed for diarrhea or loose stools.     loratadine (CLARITIN) 10 MG tablet Take 10 mg by mouth daily.     Magnesium Oxide 200 MG TABS Take 200 mg by mouth in the morning and at bedtime.     naproxen sodium (ALEVE) 220 MG tablet Take 440 mg by mouth in the morning and at bedtime.     ondansetron (ZOFRAN) 8 MG tablet Take 1 tablet (8 mg total) by mouth daily as needed for nausea or vomiting. 30 tablet 2   pantoprazole (PROTONIX) 40 MG tablet Take 1 tablet (40 mg total) by mouth 2 (two) times daily before a meal. 60 tablet 1   Potassium 99 MG TABS Take 99 mg by mouth daily at 6 (six) AM.     rOPINIRole (REQUIP)  0.5 MG tablet Take 1 tablet (0.5 mg total) by mouth at bedtime. 90 tablet 0   sildenafil (VIAGRA) 50 MG tablet Take 50 mg by mouth daily as needed for erectile dysfunction.     traMADol (ULTRAM) 50 MG tablet Take 1 tablet (50 mg total) by mouth every 12 (twelve) hours as needed 60 tablet 2   vitamin B-12 (CYANOCOBALAMIN) 1000 MCG tablet Take 1,000 mcg by mouth daily.     No current facility-administered medications for this visit.    SURGICAL HISTORY:  Past Surgical History:  Procedure Laterality Date   BACK SURGERY  1993   lumbar   BIOPSY  02/07/2019   Procedure: BIOPSY;  Surgeon: Danie Binder, MD;  Location: [facility];  Service: Endoscopy;;   BIOPSY  01/30/2022   Procedure: BIOPSY;  Surgeon:  Eloise Harman, DO;  [facility]: [facility];  Service: Endoscopy;;   CERVICAL SPINE SURGERY  2000   COLONOSCOPY N/A 02/07/2019   Dr. Oneida Alar: External hemorrhoids next colonoscopy in 10 years   ESOPHAGOGASTRODUODENOSCOPY N/A 02/07/2019   Dr. Oneida Alar: Barrett's esophagus without dysplasia chronic inactive gastritis but no H. 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, nonbleeding duodenal diverticulum,   ESOPHAGOGASTRODUODENOSCOPY N/A 01/05/2020   Procedure: ESOPHAGOGASTRODUODENOSCOPY (EGD);  Surgeon: Danie Binder, MD;  [facility];  Service: Endoscopy;  Laterality: N/A;  10:30am   ESOPHAGOGASTRODUODENOSCOPY (EGD) WITH PROPOFOL N/A 12/06/2020   Procedure: ESOPHAGOGASTRODUODENOSCOPY (EGD) WITH PROPOFOL;  Surgeon: Eloise Harman, DO;  [facility];  Service: Endoscopy;  Laterality: N/A;  11:45am   ESOPHAGOGASTRODUODENOSCOPY (EGD) WITH PROPOFOL N/A 12/22/2021   long-segment Barrett's s/p biopsy, gastric bypass with normal sized puch and intact staple line. GJ anastomosis with healthy mucosa. Gastritis.   ESOPHAGOGASTRODUODENOSCOPY (EGD) WITH PROPOFOL N/A 01/30/2022   Procedure: ESOPHAGOGASTRODUODENOSCOPY (EGD) WITH PROPOFOL;  Surgeon: Eloise Harman, DO;  [facility];  Service: Endoscopy;  Laterality: N/A;  11:00am   GASTROJEJUNOSTOMY N/A 01/03/2021   Procedure: GASTROJEJUNOSTOMY;  Surgeon: Aviva Signs, MD;  Location: [facility];  Service: General;  Laterality: N/A;   HAND SURGERY     SAVORY DILATION N/A 01/05/2020   Procedure: SAVORY DILATION;  Surgeon: Danie Binder, MD;  [facility];  Service: Endoscopy;  Laterality: N/A;    REVIEW OF SYSTEMS:  A comprehensive review of systems was negative except for: Constitutional: positive for fatigue Gastrointestinal: positive for nausea   PHYSICAL EXAMINATION: General appearance: alert, cooperative, fatigued, and no distress Head:  Normocephalic, without obvious abnormality, atraumatic Neck: no adenopathy, no JVD, supple, symmetrical, trachea midline, and thyroid not enlarged, symmetric, no tenderness/mass/nodules Lymph nodes: Cervical, supraclavicular, and axillary nodes normal. Resp: clear to auscultation bilaterally Back: symmetric, no curvature. ROM normal. No CVA tenderness. Cardio: regular rate and rhythm, S1, S2 normal, no murmur, click, rub or gallop GI: soft, non-tender; bowel sounds normal; no masses,  no organomegaly Extremities: extremities normal, atraumatic, no cyanosis or edema  ECOG PERFORMANCE STATUS: 1 - Symptomatic but completely ambulatory  Blood pressure 131/65, pulse 71, temperature 98.2 F (36.8 C), temperature source Oral, resp. rate 16, weight 217 lb (98.4 kg), SpO2 100 %.  LABORATORY DATA: Lab Results  Component Value Date   WBC 5.8 12/14/2022   HGB 11.4 (L) 12/14/2022   HCT 33.9 (L) 12/14/2022   MCV 92.6 12/14/2022   PLT 202 12/14/2022      Chemistry      Component Value Date/Time  NA 136 12/14/2022 0832   NA 138 05/17/2021 1657   K 4.0 12/14/2022 0832   CL 104 12/14/2022 0832   CO2 27 12/14/2022 0832   BUN 17 12/14/2022 0832   BUN 11 05/17/2021 1657   CREATININE 0.65 12/14/2022 0832   CREATININE 0.72 10/17/2016 1012      Component Value Date/Time   CALCIUM 8.7 (L) 12/14/2022 0832   ALKPHOS 61 12/14/2022 0832   AST 24 12/14/2022 0832   ALT 21 12/14/2022 0832   BILITOT 0.2 (L) 12/14/2022 0832       RADIOGRAPHIC STUDIES: No results found.  ASSESSMENT AND PLAN: This is a very pleasant 62 years old white male with iron deficiency anemia secondary to malabsorption secondary to gastrojejunostomy in March 2022. The patient was treated with iron infusion with Venofer 300 mg IV for 3 doses.  Last dose was given in November 2022. The patient is currently on over-the-counter iron tablet with vitamin C twice daily. He has been tolerating this treatment well with no concerning  adverse effects. Repeat CBC today showed improvement of his hemoglobin up to 11.4 and hematocrit 33.9%. Iron study and ferritin are still pending. I recommended for the patient to continue his current treatment with the over-the-counter iron tablets.  I will consider him for iron infusion if the pending iron studies showed significant deficiency. I will see the patient back for follow-up visit in 3 months for evaluation and repeat blood work. He was advised to call immediately if he has any concerning symptoms in the interval. All questions were answered. The patient knows to call the clinic with any problems, questions or concerns. We can certainly see the patient much sooner if necessary. The total time spent in the appointment was 20 minutes.  Disclaimer: This note was dictated with voice recognition software. Similar sounding words can inadvertently be transcribed and may not be corrected upon review.

## 2022-12-25 ENCOUNTER — Other Ambulatory Visit: Payer: Self-pay | Admitting: Internal Medicine

## 2022-12-25 ENCOUNTER — Encounter: Payer: Self-pay | Admitting: Physician Assistant

## 2022-12-25 ENCOUNTER — Other Ambulatory Visit: Payer: Self-pay

## 2022-12-25 ENCOUNTER — Encounter: Payer: Self-pay | Admitting: Internal Medicine

## 2022-12-25 DIAGNOSIS — J302 Other seasonal allergic rhinitis: Secondary | ICD-10-CM

## 2022-12-25 MED ORDER — SILDENAFIL CITRATE 50 MG PO TABS
50.0000 mg | ORAL_TABLET | ORAL | 3 refills | Status: DC | PRN
  Filled 2022-12-25: qty 10, 10d supply, fill #0
  Filled 2022-12-25: qty 10, 30d supply, fill #0
  Filled 2023-05-13: qty 10, 10d supply, fill #1
  Filled 2023-11-13: qty 10, 10d supply, fill #2

## 2022-12-25 MED ORDER — FLUTICASONE PROPIONATE 50 MCG/ACT NA SUSP
1.0000 | Freq: Every day | NASAL | 0 refills | Status: DC | PRN
  Filled 2022-12-25: qty 16, 60d supply, fill #0

## 2022-12-29 ENCOUNTER — Encounter: Payer: Self-pay | Admitting: Internal Medicine

## 2023-01-03 ENCOUNTER — Encounter: Payer: Self-pay | Admitting: Internal Medicine

## 2023-01-08 ENCOUNTER — Encounter: Payer: Self-pay | Admitting: Physician Assistant

## 2023-01-08 ENCOUNTER — Encounter: Payer: Self-pay | Admitting: Internal Medicine

## 2023-01-15 ENCOUNTER — Encounter: Payer: Self-pay | Admitting: Internal Medicine

## 2023-01-15 ENCOUNTER — Other Ambulatory Visit: Payer: Self-pay

## 2023-01-15 ENCOUNTER — Ambulatory Visit: Payer: Medicaid Other | Attending: Internal Medicine | Admitting: Internal Medicine

## 2023-01-15 ENCOUNTER — Encounter: Payer: Self-pay | Admitting: Physician Assistant

## 2023-01-15 ENCOUNTER — Other Ambulatory Visit: Payer: Self-pay | Admitting: Internal Medicine

## 2023-01-15 VITALS — BP 121/68 | HR 73 | Wt 212.2 lb

## 2023-01-15 DIAGNOSIS — E1169 Type 2 diabetes mellitus with other specified complication: Secondary | ICD-10-CM

## 2023-01-15 DIAGNOSIS — I152 Hypertension secondary to endocrine disorders: Secondary | ICD-10-CM

## 2023-01-15 DIAGNOSIS — M25512 Pain in left shoulder: Secondary | ICD-10-CM

## 2023-01-15 DIAGNOSIS — R251 Tremor, unspecified: Secondary | ICD-10-CM | POA: Diagnosis not present

## 2023-01-15 DIAGNOSIS — E669 Obesity, unspecified: Secondary | ICD-10-CM

## 2023-01-15 DIAGNOSIS — R058 Other specified cough: Secondary | ICD-10-CM | POA: Diagnosis not present

## 2023-01-15 DIAGNOSIS — J302 Other seasonal allergic rhinitis: Secondary | ICD-10-CM

## 2023-01-15 DIAGNOSIS — E1159 Type 2 diabetes mellitus with other circulatory complications: Secondary | ICD-10-CM | POA: Diagnosis not present

## 2023-01-15 DIAGNOSIS — D508 Other iron deficiency anemias: Secondary | ICD-10-CM

## 2023-01-15 DIAGNOSIS — M160 Bilateral primary osteoarthritis of hip: Secondary | ICD-10-CM | POA: Diagnosis not present

## 2023-01-15 DIAGNOSIS — I25118 Atherosclerotic heart disease of native coronary artery with other forms of angina pectoris: Secondary | ICD-10-CM | POA: Insufficient documentation

## 2023-01-15 DIAGNOSIS — D509 Iron deficiency anemia, unspecified: Secondary | ICD-10-CM

## 2023-01-15 DIAGNOSIS — M5136 Other intervertebral disc degeneration, lumbar region: Secondary | ICD-10-CM | POA: Diagnosis not present

## 2023-01-15 MED ORDER — FERROUS SULFATE 325 (65 FE) MG PO TABS
325.0000 mg | ORAL_TABLET | Freq: Two times a day (BID) | ORAL | 0 refills | Status: DC
  Filled 2023-01-15 – 2023-01-17 (×2): qty 180, 90d supply, fill #0
  Filled 2023-02-15: qty 60, 30d supply, fill #0
  Filled 2023-04-04: qty 60, 30d supply, fill #1
  Filled 2023-05-10 – 2023-05-18 (×2): qty 60, 30d supply, fill #2

## 2023-01-15 MED ORDER — FLUTICASONE PROPIONATE 50 MCG/ACT NA SUSP
1.0000 | Freq: Every day | NASAL | 0 refills | Status: DC | PRN
  Filled 2023-01-15 – 2023-05-13 (×3): qty 16, 60d supply, fill #0

## 2023-01-15 NOTE — Progress Notes (Signed)
Patient ID: Jeremy [name], male    DOB: 16-Dec-1960  MRN: [id number]  CC: No chief complaint on file.   Subjective: Jeremy [name] is a 62 y.o. male who presents for chronic ds management His concerns today include:  Pt with hx of DM, HL, HTN,GERD, IDA (Barrett's esophagus and moderate gastritis thought to be NSAID induced on EGD; iron infusions by Dr. 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  Myoview neg 02/2021   DM: Lab Results  Component Value Date   HGBA1C 4.9 09/14/2022  And taking Glucotrol 5 mg twice a day.  Checks blood sugars about every other day.  Some of his recent readings were 99, 165, 98, 95, 130, 133, 101.  Denies any low blood sugar episodes. Does well with his eating habits.  HTN: Compliant with lisinopril 20 mg twice a day.  He limits salt in the foods.  No chest pains.  IDA: Had recent follow-up with hematologist.  Taking iron supplement twice a day with a vitamin C supplement.  Complain of dry cough x 3 days associated with rhinorrhea, sneezing.  No itchy throat, shortness of breath or fever.  He is on Claritin.  Complains of pain in the left shoulder x 1 month.  Does handyman jobs.  Pain started after he was hammering a piece of pipe under a sidewalk.  About 2 days later he had picked up a 5 gallon bottle of water and this made it worse.  Pain is worse with elevation of the arm to about 90 degrees.  He has been taking Advil up to 4 times a day. He has tramadol at home which has been prescribed for him for arthritis in the hip and chronic lower back pain due to spinal stenosis and DDD.  However he states that the tramadol has not controlled the pain in his shoulder sufficiently.  Complains of intermittent jerks and tremors in both arms.  This has been going on for several months but episodes have been occurring more frequently.   He has history of restless leg syndrome for which he is on Requip. Patient Active Problem List   Diagnosis Date Noted   Hypertension associated with type 2 diabetes mellitus 01/15/2023   Coronary artery disease of native artery of native heart with stable angina pectoris 01/15/2023   Constipation 10/25/2022   Upper abdominal pain 07/06/2022   Nausea and vomiting 07/06/2022   Hyponatremia 06/23/2021   Low serum vitamin B12 03/23/2021   Vitamin D deficiency 03/23/2021   S/P bypass gastrojejunostomy 01/03/2021   Gastric outlet obstruction    Duodenal stricture    Primary osteoarthritis of both hips 11/15/2020   Aortic atherosclerosis 08/30/2020   Coronary artery calcification 08/30/2020   Bilateral carpal tunnel syndrome 06/07/2020   Grief reaction 06/07/2020   Pyloric stenosis in adult 02/05/2020   Congenital hypertrophic pyloric stenosis    Duodenal web    Barrett's esophagus without dysplasia 05/19/2019   OSA (obstructive sleep apnea) 04/30/2019   Iron deficiency anemia 11/25/2018   Phimosis of penis 11/19/2018   Esophageal dysphagia 08/19/2018   Benign prostatic hyperplasia with post-Jeremy dribbling 01/31/2018   Absence of bladder continence 01/31/2018   Obesity (BMI 30-39.9) 11/05/2017   GERD (gastroesophageal reflux disease) 06/03/2017   Chronic pain of both knees 06/03/2017   Chronic lower back pain 06/03/2017   Type 2 diabetes mellitus without  complication, without long-term current use of insulin 04/12/2016   Essential hypertension 04/12/2016     Current Outpatient Medications on File Prior to Visit  Medication Sig Dispense Refill   acetaminophen (TYLENOL) 500 MG tablet Take 1,000 mg by mouth in the morning and at bedtime.      Blood Glucose Monitoring Suppl (TRUE METRIX METER) w/Device KIT 1 each by Does not apply route 4 (four) times daily -  before meals and at bedtime. 1 kit 0   cyclobenzaprine (FLEXERIL) 10 MG tablet Take 1 tablet (10 mg total) by mouth 2 (two)  times daily as needed for muscle spasms. 60 tablet 3   glipiZIDE (GLUCOTROL) 5 MG tablet TAKE 1 TABLET BY MOUTH IN THE MORNINGS AND 1 TABLET IN THE EVENINGS. 180 tablet 0   glucose blood test strip Use as instructed 100 each 12   lisinopril (ZESTRIL) 20 MG tablet Take 1 tablet (20 mg total) by mouth in the morning and at bedtime. 180 tablet 1   loperamide (IMODIUM A-D) 2 MG tablet Take 2 mg by mouth 4 (four) times daily as needed for diarrhea or loose stools.     loratadine (CLARITIN) 10 MG tablet Take 10 mg by mouth daily.     Magnesium Oxide 200 MG TABS Take 200 mg by mouth in the morning and at bedtime.     naproxen sodium (ALEVE) 220 MG tablet Take 440 mg by mouth in the morning and at bedtime.     ondansetron (ZOFRAN) 8 MG tablet Take 1 tablet (8 mg total) by mouth daily as needed for nausea or vomiting. 30 tablet 2   pantoprazole (PROTONIX) 40 MG tablet Take 1 tablet (40 mg total) by mouth 2 (two) times daily before a meal. 60 tablet 1   Potassium 99 MG TABS Take 99 mg by mouth daily at 6 (six) AM.     rOPINIRole (REQUIP) 0.5 MG tablet Take 1 tablet (0.5 mg total) by mouth at bedtime. 90 tablet 0   sildenafil (VIAGRA) 50 MG tablet Take 1 tablet (50 mg total) by mouth 1/2 hr before intercourse.  Limit use to 1 tab/24 hr 10 tablet 3   traMADol (ULTRAM) 50 MG tablet Take 1 tablet (50 mg total) by mouth every 12 (twelve) hours as needed 60 tablet 2   vitamin B-12 (CYANOCOBALAMIN) 1000 MCG tablet Take 1,000 mcg by mouth daily.     No current facility-administered medications on file prior to visit.    Allergies  Allergen Reactions   Cymbalta [Duloxetine Hcl] Other (See Comments)    Caused anorgasmia   Doxycycline     Unknown   Other Nausea And Vomiting    Mayonnaise mustard ketchup   Robaxin [Methocarbamol] Other (See Comments)    Had hiccups x 4 hours after taking    Social History   Socioeconomic History   Marital status: Significant Other    Spouse name: Not on file   Number  of children: Not on file   Years of education: Not on file   Highest education level: Not on file  Occupational History   Occupation: Clinical biochemist  Tobacco Use   Smoking status: Former    Types: Cigarettes    Quit date: 10/16/1988    Years since quitting: 34.2   Smokeless tobacco: Never  Vaping Use   Vaping Use: Never used  Substance and Sexual Activity   Alcohol use: No   Drug use: Yes    Comment: rare   Sexual activity: Not on  file  Other Topics Concern   Not on file  Social History Narrative   Volunteers as Airline pilot   Social Determinants of Health   Financial Resource Strain: Not on file  Food Insecurity: Not on file  Transportation Needs: Not on file  Physical Activity: Not on file  Stress: Not on file  Social Connections: Not on file  Intimate Partner Violence: Not on file    Family History  Problem Relation Age of Onset   Breast cancer Mother    Pancreatic cancer Mother    CAD Father    Colon cancer Paternal Grandfather     Past Surgical History:  Procedure Laterality Date   BACK SURGERY  1993   lumbar   BIOPSY  02/07/2019   Procedure: BIOPSY;  Surgeon: Danie Binder, MD;  [facility];  Service: Endoscopy;;   BIOPSY  01/30/2022   Procedure: BIOPSY;  Surgeon: Eloise Harman, DO;  [facility];  Service: Endoscopy;;   CERVICAL SPINE SURGERY  2000   COLONOSCOPY N/A 02/07/2019   Dr. Oneida Alar: External hemorrhoids next colonoscopy in 10 years   ESOPHAGOGASTRODUODENOSCOPY N/A 02/07/2019   Dr. Oneida Alar: Barrett's esophagus without dysplasia chronic inactive gastritis but no H. 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, nonbleeding duodenal diverticulum,   ESOPHAGOGASTRODUODENOSCOPY N/A 01/05/2020   Procedure: ESOPHAGOGASTRODUODENOSCOPY (EGD);  Surgeon: Danie Binder, MD;  [facility];  Service: Endoscopy;  Laterality: N/A;  10:30am   ESOPHAGOGASTRODUODENOSCOPY  (EGD) WITH PROPOFOL N/A 12/06/2020   Procedure: ESOPHAGOGASTRODUODENOSCOPY (EGD) WITH PROPOFOL;  Surgeon: Eloise Harman, DO;  [facility];  Service: Endoscopy;  Laterality: N/A;  11:45am   ESOPHAGOGASTRODUODENOSCOPY (EGD) WITH PROPOFOL N/A 12/22/2021   long-segment Barrett's s/p biopsy, gastric bypass with normal sized puch and intact staple line. GJ anastomosis with healthy mucosa. Gastritis.   ESOPHAGOGASTRODUODENOSCOPY (EGD) WITH PROPOFOL N/A 01/30/2022   Procedure: ESOPHAGOGASTRODUODENOSCOPY (EGD) WITH PROPOFOL;  Surgeon: Eloise Harman, DO;  [facility];  Service: Endoscopy;  Laterality: N/A;  11:00am   GASTROJEJUNOSTOMY N/A 01/03/2021   Procedure: GASTROJEJUNOSTOMY;  Surgeon: Aviva Signs, MD;  Location: [facility];  Service: General;  Laterality: N/A;   HAND SURGERY     SAVORY DILATION N/A 01/05/2020   Procedure: SAVORY DILATION;  Surgeon: Danie Binder, MD;  [facility];  Service: Endoscopy;  Laterality: N/A;    ROS: Review of Systems Negative except as stated above  PHYSICAL EXAM: BP 121/68 (BP Location: Left Arm, Patient Position: Sitting, Cuff Size: Normal)   Pulse 73   Wt 212 lb 3.2 oz (96.3 kg)   SpO2 100%   BMI 30.45 kg/m   Wt Readings from Last 3 Encounters:  01/15/23 212 lb 3.2 oz (96.3 kg)  12/14/22 217 lb (98.4 kg)  10/25/22 217 lb 8 oz (98.7 kg)    Physical Exam   General appearance - alert, well appearing, older Caucasian male and in no distress Mental status - normal mood, behavior, speech, dress, motor activity, and thought processes Neck - supple, no significant adenopathy Chest - clear to auscultation, no wheezes, rales or rhonchi, symmetric air entry Heart - normal rate, regular rhythm, normal S1, S2, no murmurs, rubs, clicks or gallops Musculoskeletal -left shoulder: No point tenderness.  He has mild discomfort with passive range of motion in all directions.  Drop arm test mildly positive. Extremities -  peripheral pulses normal, no pedal edema, no clubbing or cyanosis  Latest Ref Rng & Units 12/14/2022    8:32 AM 07/12/2022    3:53 PM 05/22/2022    3:52 PM  CMP  Glucose 70 - 99 mg/dL 87     BUN 6 - 20 mg/dL 17     Creatinine 0.61 - 1.24 mg/dL 0.65  0.80    Sodium 135 - 145 mmol/L 136     Potassium 3.5 - 5.1 mmol/L 4.0     Chloride 98 - 111 mmol/L 104     CO2 22 - 32 mmol/L 27     Calcium 8.9 - 10.3 mg/dL 8.7     Total Protein 6.5 - 8.1 g/dL 5.7   6.2   Total Bilirubin 0.3 - 1.2 mg/dL 0.2   <0.2   Alkaline Phos 38 - 126 U/L 61   111   AST 15 - 41 U/L 24   32   ALT 0 - 44 U/L 21   33    Lipid Panel     Component Value Date/Time   CHOL 87 (L) 05/22/2022 1552   TRIG 64 05/22/2022 1552   HDL 34 (L) 05/22/2022 1552   CHOLHDL 2.6 05/22/2022 1552   CHOLHDL 4.8 10/17/2016 1012   VLDL NOT CALC 10/17/2016 1012   LDLCALC 39 05/22/2022 1552    CBC    Component Value Date/Time   WBC 5.8 12/14/2022 0832   WBC 10.0 03/09/2021 2008   RBC 3.66 (L) 12/14/2022 0832   HGB 11.4 (L) 12/14/2022 0832   HGB 10.0 (L) 05/17/2021 1657   HCT 33.9 (L) 12/14/2022 0832   HCT 31.6 (L) 05/17/2021 1657   PLT 202 12/14/2022 0832   PLT 236 05/17/2021 1657   MCV 92.6 12/14/2022 0832   MCV 89 05/17/2021 1657   MCH 31.1 12/14/2022 0832   MCHC 33.6 12/14/2022 0832   RDW 13.2 12/14/2022 0832   RDW 16.4 (H) 05/17/2021 1657   LYMPHSABS 1.6 12/14/2022 0832   LYMPHSABS 2.0 08/13/2019 1617   MONOABS 0.5 12/14/2022 0832   EOSABS 0.4 12/14/2022 0832   EOSABS 0.4 08/13/2019 1617   BASOSABS 0.1 12/14/2022 0832   BASOSABS 0.1 08/13/2019 1617    ASSESSMENT AND PLAN: 1. Type 2 diabetes mellitus with obesity Reported blood sugar readings are at goal.  No hypoglycemic episodes.  He will continue glipizide 5 mg twice a day.  2. Hypertension associated with type 2 diabetes mellitus At goal on lisinopril 20 mg twice a day.  3. Dry cough Likely due to allergies.  Advised to stop the Claritin and try Allegra  instead  4. Left shoulder pain, unspecified chronicity Possible rotator cuff injury.  Will refer to orthopedics.  Advised to stop Advil given that he has had a Roux-en-Y procedure and known endoscopy proven gastritis.  Use the tramadol as prescribed. - Ambulatory referral to Orthopedics  5. Other iron deficiency anemia Continue iron supplement.  6. Tremor - Ambulatory referral to Neurology  7.  Osteoarthritis of the hips 8.  Degenerative disc of the lumbar spine -Continue tramadol as prescribed and as needed.  We did an update of his controlled substance prescribing agreement today.  Lab was already close by the end of this visit.  We will plan to do urine drug screen and update opioid risk assessment on next visit.  Patient was given the opportunity to ask questions.  Patient verbalized understanding of the plan and was able to repeat key elements of the plan.   This documentation was completed using Radio producer.  Any transcriptional errors are unintentional.  Orders Placed This Encounter  Procedures   Ambulatory referral to Neurology   Ambulatory referral to Orthopedics     Requested Prescriptions    No prescriptions requested or ordered in this encounter    Return in about 4 months (around 05/17/2023).  Karle Plumber, MD, FACP

## 2023-01-15 NOTE — Telephone Encounter (Signed)
Requested Prescriptions  Pending Prescriptions Disp Refills   ferrous sulfate (FEROSUL) 325 (65 FE) MG tablet 180 tablet 0    Sig: Take 1 tablet (325 mg total) by mouth 2 (two) times daily with a meal.     Endocrinology:  Minerals - Iron Supplementation Failed - 01/15/2023 12:29 AM      Failed - HGB in normal range and within 360 days    Hemoglobin  Date Value Ref Range Status  12/14/2022 11.4 (L) 13.0 - 17.0 g/dL Final  05/17/2021 10.0 (L) 13.0 - 17.7 g/dL Final         Failed - HCT in normal range and within 360 days    HCT  Date Value Ref Range Status  12/14/2022 33.9 (L) 39.0 - 52.0 % Final   Hematocrit  Date Value Ref Range Status  05/17/2021 31.6 (L) 37.5 - 51.0 % Final         Failed - RBC in normal range and within 360 days    RBC  Date Value Ref Range Status  12/14/2022 3.66 (L) 4.22 - 5.81 MIL/uL Final         Failed - Ferritin in normal range and within 360 days    Ferritin  Date Value Ref Range Status  12/14/2022 13 (L) 24 - 336 ng/mL Final    Comment:    Performed at [facility], [address] [place], [place] [address]  03/11/2021 23 (L) 30 - 400 ng/mL Final         Passed - Fe (serum) in normal range and within 360 days    Iron  Date Value Ref Range Status  12/14/2022 166 45 - 182 ug/dL Final  03/11/2021 18 (L) 38 - 169 ug/dL Final   Iron Saturation  Date Value Ref Range Status  03/11/2021 6 (LL) 15 - 55 % Final   Saturation Ratios  Date Value Ref Range Status  12/14/2022 51 (H) 17.9 - 39.5 % Final         Passed - Valid encounter within last 12 months    Recent Outpatient Visits           4 months ago Type 2 diabetes mellitus with obesity ([place])   [place] [name], MD   7 months ago Right elbow pain   [place], [place] W, NP   8 months ago Type 2 diabetes mellitus with obesity [facility])   [place] [name], MD   8 months ago Patient left before evaluation by [place] [name], MD   11 months ago Bloating symptom   [place], MD       Future Appointments             Today [name], MD [place]             fluticasone [facility]) 50 MCG/ACT nasal spray 16 g 0    Sig: Place 1 spray into both nostrils daily as needed for allergies or rhinitis.     Ear, Nose, and Throat: Nasal Preparations - Corticosteroids Passed - 01/15/2023 12:29 AM      Passed - Valid encounter within last 12 months    Recent Outpatient Visits  4 months ago Type 2 diabetes mellitus with obesity [facility])   [place] [name], MD   7 months ago Right elbow pain   [place], [place] W, NP   8 months ago Type 2 diabetes mellitus with obesity [facility])   [place] [name], MD   8 months ago Patient left before evaluation by [place] [name], MD   11 months ago Bloating symptom   [place], MD       Future Appointments             Today [name], MD [place]

## 2023-01-16 ENCOUNTER — Encounter: Payer: Self-pay | Admitting: Physician Assistant

## 2023-01-16 ENCOUNTER — Encounter: Payer: Self-pay | Admitting: Internal Medicine

## 2023-01-17 ENCOUNTER — Other Ambulatory Visit: Payer: Self-pay

## 2023-01-17 ENCOUNTER — Encounter: Payer: Self-pay | Admitting: Physician Assistant

## 2023-01-17 ENCOUNTER — Encounter: Payer: Self-pay | Admitting: Internal Medicine

## 2023-01-17 DIAGNOSIS — G4733 Obstructive sleep apnea (adult) (pediatric): Secondary | ICD-10-CM | POA: Diagnosis not present

## 2023-01-18 ENCOUNTER — Other Ambulatory Visit: Payer: Self-pay

## 2023-01-19 ENCOUNTER — Other Ambulatory Visit: Payer: Self-pay

## 2023-01-22 NOTE — Progress Notes (Unsigned)
Office Visit Note   Patient: Jeremy [name]           Date of Birth: [birth year]           MRN: [id number] Visit Date: 01/23/2023              Requested by: Marcine Matar, MD [address] [place],  [place] [address] PCP: Marcine Matar, MD   Assessment & Plan: Visit Diagnoses: No diagnosis found.  Plan: ***  Follow-Up Instructions: No follow-ups on file.   Orders:  No orders of the defined types were placed in this encounter.  No orders of the defined types were placed in this encounter.     Procedures: No procedures performed   Clinical Data: No additional findings.   Subjective: No chief complaint on file.   HPI  Review of Systems  Constitutional: Negative.   HENT: Negative.    Eyes: Negative.   Respiratory: Negative.    Cardiovascular: Negative.   Gastrointestinal: Negative.   Endocrine: Negative.   Genitourinary: Negative.   Skin: Negative.   Allergic/Immunologic: Negative.   Neurological: Negative.   Hematological: Negative.   Psychiatric/Behavioral: Negative.    All other systems reviewed and are negative.   Objective: Vital Signs: There were no vitals taken for this visit.  Physical Exam Vitals and nursing note reviewed.  Constitutional:      Appearance: He is well-developed.  Pulmonary:     Effort: Pulmonary effort is normal.  Abdominal:     Palpations: Abdomen is soft.  Skin:    General: Skin is warm.  Neurological:     Mental Status: He is alert and oriented to person, place, and time.  Psychiatric:        Behavior: Behavior normal.        Thought Content: Thought content normal.        Judgment: Judgment normal.   Ortho Exam  Specialty Comments:  No specialty comments available.  Imaging: No results found.   PMFS History: Patient Active Problem List   Diagnosis Date Noted  . Hypertension associated with type 2 diabetes mellitus 01/15/2023  . Coronary artery disease of native artery of native heart  with stable angina pectoris 01/15/2023  . Constipation 10/25/2022  . Upper abdominal pain 07/06/2022  . Nausea and vomiting 07/06/2022  . Hyponatremia 06/23/2021  . Low serum vitamin B12 03/23/2021  . Vitamin D deficiency 03/23/2021  . S/P bypass gastrojejunostomy 01/03/2021  . Gastric outlet obstruction   . Duodenal stricture   . Primary osteoarthritis of both hips 11/15/2020  . Aortic atherosclerosis 08/30/2020  . Coronary artery calcification 08/30/2020  . Bilateral carpal tunnel syndrome 06/07/2020  . Grief reaction 06/07/2020  . 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 Congenital hypertrophic pyloric stenosis   . Duodenal web   . Barrett's esophagus without dysplasia 05/19/2019  . OSA (obstructive sleep apnea) 04/30/2019  . Iron deficiency anemia 11/25/2018  . Phimosis of penis 11/19/2018  . Esophageal dysphagia 08/19/2018  . Benign prostatic hyperplasia with post-void dribbling 01/31/2018  . Absence of bladder continence 01/31/2018  . Obesity (BMI 30-39.9) 11/05/2017  . GERD (gastroesophageal reflux disease) 06/03/2017  . Chronic pain of both knees 06/03/2017  . Chronic lower back pain 06/03/2017  . Type 2 diabetes mellitus without complication, without long-term current use of insulin 04/12/2016  . Essential hypertension 04/12/2016   Past Medical History:  Diagnosis Date  . Diabetes mellitus without complication   . GERD (gastroesophageal reflux disease)   .  Hyperlipidemia   . Hypertension   . IDA (iron deficiency anemia)   . Rash of entire body 03/2016  . Sleep apnea     Family History  Problem Relation Age of Onset  . Breast cancer Mother   . Pancreatic cancer Mother   . CAD Father   . Colon cancer Paternal Grandfather     Past Surgical History:  Procedure Laterality Date  . BACK SURGERY  1993   lumbar  . BIOPSY  02/07/2019   Procedure: BIOPSY;  Surgeon: West Bali, MD;  [facility];  Service: Endoscopy;;  . BIOPSY  01/30/2022    Procedure: BIOPSY;  Surgeon: Lanelle Bal, DO;  [facility];  Service: Endoscopy;;  . CERVICAL SPINE SURGERY  2000  . COLONOSCOPY N/A 02/07/2019   Dr. Darrick Penna: External hemorrhoids next colonoscopy in 10 years  . ESOPHAGOGASTRODUODENOSCOPY N/A 02/07/2019   Dr. Darrick Penna: Barrett's esophagus without dysplasia chronic inactive gastritis but no H. 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 ESOPHAGOGASTRODUODENOSCOPY N/A 01/05/2020   Procedure: ESOPHAGOGASTRODUODENOSCOPY (EGD);  Surgeon: West Bali, MD;  [facility];  Service: Endoscopy;  Laterality: N/A;  10:30am  . ESOPHAGOGASTRODUODENOSCOPY (EGD) WITH PROPOFOL N/A 12/06/2020   Procedure: ESOPHAGOGASTRODUODENOSCOPY (EGD) WITH PROPOFOL;  Surgeon: Lanelle Bal, DO;  [facility];  Service: Endoscopy;  Laterality: N/A;  11:45am  . ESOPHAGOGASTRODUODENOSCOPY (EGD) WITH PROPOFOL N/A 12/22/2021   long-segment Barrett's s/p biopsy, gastric bypass with normal sized puch and intact staple line. GJ anastomosis with healthy mucosa. Gastritis.  [place] ESOPHAGOGASTRODUODENOSCOPY (EGD) WITH PROPOFOL N/A 01/30/2022   Procedure: ESOPHAGOGASTRODUODENOSCOPY (EGD) WITH PROPOFOL;  Surgeon: Lanelle Bal, DO;  [facility];  Service: Endoscopy;  Laterality: N/A;  11:00am  . GASTROJEJUNOSTOMY N/A 01/03/2021   Procedure: GASTROJEJUNOSTOMY;  Surgeon: [name], MD;  Location: [facility];  Service: General;  Laterality: N/A;  . HAND SURGERY    . SAVORY DILATION N/A 01/05/2020   Procedure: SAVORY DILATION;  Surgeon: West Bali, MD;  [facility];  Service: Endoscopy;  Laterality: N/A;   Social History   Occupational History  . Occupation: Product/process development scientist  Tobacco Use  . Smoking status: Former    Types: Cigarettes    Quit date: 10/16/1988    Years since quitting: 34.2  . Smokeless tobacco: Never  Vaping  Use  . Vaping Use: Never used  Substance and Sexual Activity  . Alcohol use: No  . Drug use: Yes    Comment: rare  . Sexual activity: Not on file

## 2023-01-23 ENCOUNTER — Other Ambulatory Visit (INDEPENDENT_AMBULATORY_CARE_PROVIDER_SITE_OTHER): Payer: Medicaid Other

## 2023-01-23 ENCOUNTER — Encounter: Payer: Self-pay | Admitting: Orthopaedic Surgery

## 2023-01-23 ENCOUNTER — Ambulatory Visit (INDEPENDENT_AMBULATORY_CARE_PROVIDER_SITE_OTHER): Payer: Medicaid Other | Admitting: Orthopaedic Surgery

## 2023-01-23 DIAGNOSIS — M25512 Pain in left shoulder: Secondary | ICD-10-CM

## 2023-01-24 ENCOUNTER — Ambulatory Visit: Payer: Self-pay | Admitting: Gastroenterology

## 2023-01-26 ENCOUNTER — Telehealth: Payer: Self-pay

## 2023-01-26 NOTE — Telephone Encounter (Signed)
Copied from CRM [id number]. Topic: General - Inquiry >> Jan 26, 2023  1:15 PM Marlow Baars wrote: Reason for CRM: Lajoyce Corners with [facility] called in checking on the status of a fax she said was sent last Friday 04/05 regarding a cpap machine for the patient. Lajoyce Corners states the fax is 2 pages and she will resend it today. She says they just need the physicians signature and date put on the second page and fax it back to [phone]. Please assist further

## 2023-01-29 ENCOUNTER — Other Ambulatory Visit: Payer: Self-pay

## 2023-01-30 ENCOUNTER — Encounter: Payer: Self-pay | Admitting: Internal Medicine

## 2023-01-31 ENCOUNTER — Telehealth: Payer: Self-pay

## 2023-01-31 ENCOUNTER — Encounter: Payer: Self-pay | Admitting: Internal Medicine

## 2023-01-31 ENCOUNTER — Encounter: Payer: Self-pay | Admitting: Physician Assistant

## 2023-01-31 NOTE — Telephone Encounter (Signed)
Called & spoke to Roe Coombs A. at [place] with [facility] [employer]. Roe Coombs stated that the needed forms have been completed and successfully received at their office. No further action is needed at this time.

## 2023-01-31 NOTE — Telephone Encounter (Signed)
This nurse reached out to patient and advised that provider would like to see him in the office for a follow up sooner then May.  This nurse offered patient an office appointment next week with the provider.  Patient states that he will have to let us know by Friday if he can come next week.  This nurse acknowledged understanding. No further questions or concerns at this time.

## 2023-02-01 ENCOUNTER — Other Ambulatory Visit: Payer: Self-pay

## 2023-02-05 ENCOUNTER — Other Ambulatory Visit: Payer: Self-pay

## 2023-02-05 ENCOUNTER — Other Ambulatory Visit: Payer: Self-pay | Admitting: Internal Medicine

## 2023-02-05 MED ORDER — ROPINIROLE HCL 0.5 MG PO TABS
0.5000 mg | ORAL_TABLET | Freq: Every day | ORAL | 1 refills | Status: DC
  Filled 2023-02-05 – 2023-02-15 (×2): qty 90, 90d supply, fill #0

## 2023-02-07 ENCOUNTER — Other Ambulatory Visit: Payer: Self-pay | Admitting: Internal Medicine

## 2023-02-07 ENCOUNTER — Encounter: Payer: Self-pay | Admitting: Internal Medicine

## 2023-02-07 ENCOUNTER — Other Ambulatory Visit: Payer: Self-pay

## 2023-02-07 MED ORDER — TRAMADOL HCL 50 MG PO TABS
50.0000 mg | ORAL_TABLET | Freq: Three times a day (TID) | ORAL | 2 refills | Status: DC | PRN
  Filled 2023-02-07 – 2023-02-15 (×2): qty 90, 30d supply, fill #0
  Filled 2023-03-21: qty 90, 30d supply, fill #1
  Filled 2023-04-18: qty 90, 30d supply, fill #2

## 2023-02-12 ENCOUNTER — Other Ambulatory Visit: Payer: Self-pay

## 2023-02-15 ENCOUNTER — Other Ambulatory Visit: Payer: Self-pay

## 2023-02-15 ENCOUNTER — Encounter: Payer: Self-pay | Admitting: Physician Assistant

## 2023-02-15 ENCOUNTER — Encounter: Payer: Self-pay | Admitting: Internal Medicine

## 2023-02-19 ENCOUNTER — Other Ambulatory Visit: Payer: Self-pay

## 2023-02-20 ENCOUNTER — Other Ambulatory Visit: Payer: Self-pay | Admitting: Internal Medicine

## 2023-02-20 ENCOUNTER — Other Ambulatory Visit (HOSPITAL_COMMUNITY): Payer: Self-pay

## 2023-02-20 DIAGNOSIS — E119 Type 2 diabetes mellitus without complications: Secondary | ICD-10-CM

## 2023-02-20 MED ORDER — LISINOPRIL 20 MG PO TABS
20.0000 mg | ORAL_TABLET | Freq: Two times a day (BID) | ORAL | 0 refills | Status: DC
  Filled 2023-02-20 – 2023-02-21 (×2): qty 180, 90d supply, fill #0

## 2023-02-20 MED ORDER — GLIPIZIDE 5 MG PO TABS
ORAL_TABLET | ORAL | 0 refills | Status: DC
  Filled 2023-02-20 – 2023-02-21 (×2): qty 180, 90d supply, fill #0

## 2023-02-20 NOTE — Telephone Encounter (Signed)
Requested Prescriptions  Pending Prescriptions Disp Refills   lisinopril (ZESTRIL) 20 MG tablet 180 tablet 0    Sig: Take 1 tablet (20 mg total) by mouth in the morning and at bedtime.     Cardiovascular:  ACE Inhibitors Passed - 02/20/2023  8:59 AM      Passed - Cr in normal range and within 180 days    Creatinine  Date Value Ref Range Status  12/14/2022 0.65 0.61 - 1.24 mg/dL Final   Creat  Date Value Ref Range Status  10/17/2016 0.72 0.70 - 1.33 mg/dL Final    Comment:      For patients > or = 60 years of age: The upper reference limit for Creatinine is approximately 13% higher for people identified as African-American.      Creatinine, Urine  Date Value Ref Range Status  12/25/2016 190 20 - 370 mg/dL Final         Passed - K in normal range and within 180 days    Potassium  Date Value Ref Range Status  12/14/2022 4.0 3.5 - 5.1 mmol/L Final         Passed - Patient is not pregnant      Passed - Last BP in normal range    BP Readings from Last 1 Encounters:  01/15/23 121/68         Passed - Valid encounter within last 6 months    Recent Outpatient Visits           1 month ago Hypertension associated with type 2 diabetes mellitus [facility])   [place] [facility] [place] Blue B, MD   5 months ago Type 2 diabetes mellitus with obesity [facility])   [place] [facility] Marcine Matar, MD   8 months ago Right elbow pain   [place] [facility] [place], [place] W, NP   9 months ago Type 2 diabetes mellitus with obesity [facility])   [place] [facility] Marcine Matar, MD   9 months ago Patient left before evaluation by physician   [place] [facility] Jonah Blue B, MD               glipiZIDE (GLUCOTROL) 5 MG tablet 180 tablet 0    Sig: TAKE 1 TABLET BY MOUTH IN THE MORNINGS AND 1 TABLET IN THE EVENINGS.     Endocrinology:   Diabetes - Sulfonylureas Passed - 02/20/2023  8:59 AM      Passed - HBA1C is between 0 and 7.9 and within 180 days    HbA1c, POC (prediabetic range)  Date Value Ref Range Status  09/05/2019 6.3 5.7 - 6.4 % Corrected   HbA1c, POC (controlled diabetic range)  Date Value Ref Range Status  09/14/2022 4.9 0.0 - 7.0 % Final         Passed - Cr in normal range and within 360 days    Creatinine  Date Value Ref Range Status  12/14/2022 0.65 0.61 - 1.24 mg/dL Final   Creat  Date Value Ref Range Status  10/17/2016 0.72 0.70 - 1.33 mg/dL Final    Comment:      For patients > or = 60 years of age: The upper reference limit for Creatinine is approximately 13% higher for people identified as African-American.      Creatinine, Urine  Date Value Ref Range Status  12/25/2016 190 20 - 370 mg/dL Final  Passed - Valid encounter within last 6 months    Recent Outpatient Visits           1 month ago Hypertension associated with type 2 diabetes mellitus [facility])   [place] [facility] Jonah Blue B, MD   5 months ago Type 2 diabetes mellitus with obesity [facility])   [place] [facility] Marcine Matar, MD   8 months ago Right elbow pain   [place] [facility] [place], [place] W, NP   9 months ago Type 2 diabetes mellitus with obesity [facility])   [place] [facility] Marcine Matar, MD   9 months ago Patient left before evaluation by physician   [facility] Marcine Matar, MD

## 2023-02-21 ENCOUNTER — Other Ambulatory Visit (HOSPITAL_BASED_OUTPATIENT_CLINIC_OR_DEPARTMENT_OTHER): Payer: Self-pay

## 2023-02-21 ENCOUNTER — Other Ambulatory Visit (HOSPITAL_COMMUNITY): Payer: Self-pay

## 2023-02-21 ENCOUNTER — Other Ambulatory Visit: Payer: Self-pay

## 2023-02-28 NOTE — Telephone Encounter (Signed)
Mandy: can we arrange appt with me or any APP in next week? Thanks!

## 2023-03-01 ENCOUNTER — Encounter: Payer: Self-pay | Admitting: *Deleted

## 2023-03-01 ENCOUNTER — Ambulatory Visit: Payer: Medicaid Other | Admitting: Gastroenterology

## 2023-03-01 ENCOUNTER — Encounter: Payer: Self-pay | Admitting: Gastroenterology

## 2023-03-01 VITALS — BP 130/64 | HR 74 | Temp 97.7°F | Ht 70.0 in | Wt 206.8 lb

## 2023-03-01 DIAGNOSIS — K2100 Gastro-esophageal reflux disease with esophagitis, without bleeding: Secondary | ICD-10-CM | POA: Diagnosis not present

## 2023-03-01 DIAGNOSIS — R1310 Dysphagia, unspecified: Secondary | ICD-10-CM | POA: Diagnosis not present

## 2023-03-01 DIAGNOSIS — K2270 Barrett's esophagus without dysplasia: Secondary | ICD-10-CM | POA: Diagnosis not present

## 2023-03-01 NOTE — Patient Instructions (Addendum)
We will arrange to have an upper endoscopy in the near future with Dr. Marletta Lor with possible stretching of your esophagus.  Continue taking pantoprazole at least 3 times a week.    You can try taking Pepcid 20 mg on the days that you are not taking pantoprazole.  To help with constipation, start Benefiber 2 teaspoons daily x 2 weeks, then increase to twice daily.  If you notice that your bowels are not moving very well, you can try a stool softener called Colace (docusate sodium) 100 mg daily.  You can increase to 200 mg daily or decrease to every other day if needed.  We will plan to follow-up with you in the office after your procedure.   It was nice to meet you today!  Ermalinda Memos, PA-C [facility]

## 2023-03-01 NOTE — Progress Notes (Signed)
Referring Provider: Marcine Matar, MD Primary Care Physician:  Marcine Matar, MD Primary GI Physician: Dr. Marletta Lor  Chief Complaint  Patient presents with   Dysphagia    Food is getting stuck in throat     HPI:   Jeremy [name] is a 62 y.o. 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 Lovell Sheehan on 01/03/2021.   Also with history of long segment Barrett's esophagus with last EGD in April 2023, not on daily PPI due to reports of feeling a "brick" in his stomach and early satiety, presenting today with chief complaint of dysphagia.  Last seen in our office 10/25/2022.  He was taking pantoprazole twice weekly as he could not tolerate daily PPI.  Denied dysphagia.  Also with constipation with bowel movement about once a week.  Diarrhea about once a month.  Fiber supplement was not helpful.  Recommended daily PPI if tolerated, Linzess 145 mcg trial, follow-up in 3 months.  Today:  Tuesday he was eating country style steak, and a piece got stuck in his esophagus.  States he could not drink anything as it would come right back up.  This is the second time that this has happened to him.  He cannot remember when the first time was.  Reflux is fairly well-controlled on Protonix 3 times a week.  States he cannot take a PPI daily as it feels like he has swallowed a "brick".  Usually only gets heartburn/reflux 1-2 times a month.  No abdominal pain. Little vomiting a couple weeks ago when his bowels weren't moving well. No brbpr or melena.   Bowels are moving daily this week. Stopped imodium and miralax.  States these medications were causing 1 extreme or another.  Didn't take Linzess as he did not want to deal with diarrhea.   Taking Aleve for chronic back pain.    EGD April 2023: long-segment Barrett's s/p biopsy,  gastric bypass with normal sized puch and intact staple line. Gastritis. Native pylorus with 5 mm ulcer, biopsied with colod forceps for histology. Duodenum with previously noted stricture seen.  Gastric biopsy with reactive gastropathy, chronic gastritis, negative for H. pylori.  Pyloric biopsy with reactive gastropathy with ulceration and chronic gastritis, negative for H. pylori.  Esophageal biopsy consistent with Barrett's without dysplasia.    Past Medical History:  Diagnosis Date   Diabetes mellitus without complication (HCC)    GERD (gastroesophageal reflux disease)    Hyperlipidemia    Hypertension    IDA (iron deficiency anemia)    Rash of entire body 03/2016   Sleep apnea     Past Surgical History:  Procedure Laterality Date   BACK SURGERY  1993   lumbar   BIOPSY  02/07/2019   Procedure: BIOPSY;  Surgeon: West Bali, MD;  [facility];  Service: Endoscopy;;   BIOPSY  01/30/2022   Procedure: BIOPSY;  Surgeon: Lanelle Bal, DO;  [facility];  Service: Endoscopy;;   CERVICAL SPINE SURGERY  2000   COLONOSCOPY N/A 02/07/2019   Dr. Darrick Penna: External hemorrhoids next colonoscopy in 10 years   ESOPHAGOGASTRODUODENOSCOPY N/A 02/07/2019   Dr. Darrick Penna: Barrett's esophagus without dysplasia chronic inactive gastritis but no H. 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, nonbleeding duodenal diverticulum,   ESOPHAGOGASTRODUODENOSCOPY N/A 01/05/2020  Procedure: ESOPHAGOGASTRODUODENOSCOPY (EGD);  Surgeon: West Bali, MD;  [facility];  Service: Endoscopy;  Laterality: N/A;  10:30am   ESOPHAGOGASTRODUODENOSCOPY (EGD) WITH PROPOFOL N/A 12/06/2020   Procedure: ESOPHAGOGASTRODUODENOSCOPY (EGD) WITH PROPOFOL;  Surgeon: Lanelle Bal, DO;  [facility];  Service: Endoscopy;  Laterality: N/A;  11:45am   ESOPHAGOGASTRODUODENOSCOPY (EGD) WITH PROPOFOL N/A 12/22/2021   long-segment  Barrett's s/p biopsy, gastric bypass with normal sized puch and intact staple line. GJ anastomosis with healthy mucosa. Gastritis.   ESOPHAGOGASTRODUODENOSCOPY (EGD) WITH PROPOFOL N/A 01/30/2022   Procedure: ESOPHAGOGASTRODUODENOSCOPY (EGD) WITH PROPOFOL;  Surgeon: Lanelle Bal, DO;  [facility];  Service: Endoscopy;  Laterality: N/A;  11:00am   GASTROJEJUNOSTOMY N/A 01/03/2021   Procedure: GASTROJEJUNOSTOMY;  Surgeon: [name], MD;  Location: [facility];  Service: General;  Laterality: N/A;   HAND SURGERY     SAVORY DILATION N/A 01/05/2020   Procedure: SAVORY DILATION;  Surgeon: West Bali, MD;  [facility];  Service: Endoscopy;  Laterality: N/A;    Current Outpatient Medications  Medication Sig Dispense Refill   acetaminophen (TYLENOL) 500 MG tablet Take 1,000 mg by mouth in the morning and at bedtime.      Blood Glucose Monitoring Suppl (TRUE METRIX METER) w/Device KIT 1 each by Does not apply route 4 (four) times daily -  before meals and at bedtime. 1 kit 0   cyclobenzaprine (FLEXERIL) 10 MG tablet Take 1 tablet (10 mg total) by mouth 2 (two) times daily as needed for muscle spasms. 60 tablet 3   ferrous sulfate (FEROSUL) 325 (65 FE) MG tablet Take 1 tablet (325 mg total) by mouth 2 (two) times daily with a meal. 180 tablet 0   fluticasone (FLONASE) 50 MCG/ACT nasal spray Place 1 spray into both nostrils daily as needed for allergies or rhinitis. 16 g 0   glipiZIDE (GLUCOTROL) 5 MG tablet Take 1 tablet (5 mg total) in the morning AND 1 tablet (5 mg total) every evening. 180 tablet 0   glucose blood test strip Use as instructed 100 each 12   lisinopril (ZESTRIL) 20 MG tablet Take 1 tablet (20 mg total) by mouth in the morning and at bedtime. 180 tablet 0   loratadine (CLARITIN) 10 MG tablet Take 10 mg by mouth daily.     Magnesium Oxide 200 MG TABS Take 200 mg by mouth in the morning and at bedtime.     naproxen sodium (ALEVE) 220 MG tablet Take 440 mg by  mouth in the morning and at bedtime.     ondansetron (ZOFRAN) 8 MG tablet Take 1 tablet (8 mg total) by mouth daily as needed for nausea or vomiting. 30 tablet 2   pantoprazole (PROTONIX) 40 MG tablet Take 1 tablet (40 mg total) by mouth 2 (two) times daily before a meal. 60 tablet 1   Potassium 99 MG TABS Take 99 mg by mouth daily at 6 (six) AM.     rOPINIRole (REQUIP) 0.5 MG tablet Take 1 tablet (0.5 mg total) by mouth at bedtime. 90 tablet 1   sildenafil (VIAGRA) 50 MG tablet Take 1 tablet (50 mg total) by mouth 1/2 hr before intercourse.  Limit use to 1 tab/24 hr 10 tablet 3   traMADol (ULTRAM) 50 MG tablet Take 1 tablet (50 mg total) by mouth every 8 (eight) hours as needed 90 tablet 2   vitamin B-12 (CYANOCOBALAMIN) 1000 MCG tablet Take 1,000 mcg by mouth daily.  loperamide (IMODIUM A-D) 2 MG tablet Take 2 mg by mouth 4 (four) times daily as needed for diarrhea or loose stools. (Patient not taking: Reported on 03/01/2023)     No current facility-administered medications for this visit.    Allergies as of 03/01/2023 - Review Complete 03/01/2023  Allergen Reaction Noted   Cymbalta [duloxetine hcl] Other (See Comments) 06/05/2017   Doxycycline  03/02/2021   Other Nausea And Vomiting 01/24/2019   Robaxin [methocarbamol] Other (See Comments) 06/02/2019    Family History  Problem Relation Age of Onset   Breast cancer Mother    Pancreatic cancer Mother    CAD Father    Colon cancer Paternal Grandfather     Social History   Socioeconomic History   Marital status: Significant Other    Spouse name: Not on file   Number of children: Not on file   Years of education: Not on file   Highest education level: Not on file  Occupational History   Occupation: Product/process development scientist  Tobacco Use   Smoking status: Former    Types: Cigarettes    Quit date: 10/16/1988    Years since quitting: 34.3   Smokeless tobacco: Never  Vaping Use   Vaping Use: Never used  Substance and Sexual Activity    Alcohol use: No   Drug use: Yes    Comment: rare   Sexual activity: Not on file  Other Topics Concern   Not on file  Social History Narrative   Volunteers as IT sales professional   Social Determinants of Health   Financial Resource Strain: Not on file  Food Insecurity: Not on file  Transportation Needs: Not on file  Physical Activity: Not on file  Stress: Not on file  Social Connections: Not on file    Review of Systems: Gen: Denies fever, chills, cold or flulike symptoms, presyncope, syncope. CV: Denies chest pain, palpitations. Resp: Denies dyspnea, cough. GI: See HPI  Heme: See HPI  Physical Exam: BP 130/64 (BP Location: Right Arm, Patient Position: Sitting, Cuff Size: Normal)   Pulse 74   Temp 97.7 F (36.5 C) (Temporal)   Ht 5\' 10"  (1.778 m)   Wt 206 lb 12.8 oz (93.8 kg)   SpO2 99%   BMI 29.67 kg/m  General:   Alert and oriented. No distress noted. Pleasant and cooperative.  Head:  Normocephalic and atraumatic. Eyes:  Conjuctiva clear without scleral icterus. Heart:  S1, S2 present without murmurs appreciated. Lungs:  Clear to auscultation bilaterally. No wheezes, rales, or rhonchi. No distress.  Abdomen:  +BS, soft, non-tender and non-distended. No rebound or guarding. No HSM or masses noted. Msk:  Symmetrical without gross deformities. Normal posture. Extremities:  Without edema. Neurologic:  Alert and  oriented x4 Psych:  Normal mood and affect.    Assessment:  62 y.o. 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 Lovell Sheehan on 01/03/2021.   Also with history of long segment Barrett's esophagus with last EGD in April 2023, not on daily PPI due to reports of feeling a "brick" in his stomach and early satiety, presenting today with chief complaint of dysphagia.   Dysphagia: 2 fairly  recent episodes of meat getting stuck in his esophagus.  Differentials include esophageal web, ring, stricture, unable to rule out malignancy with known history of Barrett's esophagus.  Needs EGD for reevaluation and therapeutic intervention.  GERD: Fairly well-controlled on pantoprazole 3 times a week as he is unable to tolerate daily PPI.  Advised he could try taking Pepcid 20 mg daily on the days that he does not take pantoprazole.  Constipation: Had been alternating between constipation and diarrhea as MiraLAX would cause diarrhea and Imodium will cause constipation.  He has since discontinued both medications and his bowel function has improved quite a bit.    Plan:  Proceed with upper endoscopy +/- dilation with propofol by Dr. Marletta Lor in near future. The risks, benefits, and alternatives have been discussed with the patient in detail. The patient states understanding and desires to proceed.  ASA 3 No diabetes medications morning of procedure. Patient will follow 24-hour clear liquid diet 1 day prior per patients request due to failed attempts in the past. Continue pantoprazole at least 3 days a week. May try taking Pepcid 20 mg daily on the days he is not taking pantoprazole. Start Benefiber 2 teaspoons daily x 2 weeks, then increase to twice daily. Docusate sodium 100 mg daily if needed.  May increase to 200 mg daily or decrease to every other day. Follow-up after EGD.   Ermalinda Memos, PA-C [facility] 03/01/2023

## 2023-03-01 NOTE — Telephone Encounter (Signed)
Pt has been scheduled to see Baxter Hire today

## 2023-03-02 ENCOUNTER — Encounter: Payer: Self-pay | Admitting: Internal Medicine

## 2023-03-04 ENCOUNTER — Other Ambulatory Visit: Payer: Self-pay | Admitting: Internal Medicine

## 2023-03-04 MED ORDER — ROPINIROLE HCL 1 MG PO TABS
1.0000 mg | ORAL_TABLET | Freq: Every day | ORAL | 1 refills | Status: DC
  Filled 2023-03-04: qty 75, 75d supply, fill #0
  Filled 2023-03-06: qty 15, 15d supply, fill #0
  Filled 2023-04-04: qty 90, 90d supply, fill #0
  Filled 2023-07-03: qty 90, 90d supply, fill #1

## 2023-03-05 ENCOUNTER — Other Ambulatory Visit: Payer: Self-pay

## 2023-03-06 ENCOUNTER — Other Ambulatory Visit: Payer: Self-pay

## 2023-03-10 ENCOUNTER — Inpatient Hospital Stay (HOSPITAL_BASED_OUTPATIENT_CLINIC_OR_DEPARTMENT_OTHER)
Admission: EM | Admit: 2023-03-10 | Discharge: 2023-03-12 | DRG: 378 | Disposition: A | Discharge status: Home / Self Care | Payer: Medicaid Other | Attending: Internal Medicine | Admitting: Internal Medicine

## 2023-03-10 ENCOUNTER — Encounter (HOSPITAL_BASED_OUTPATIENT_CLINIC_OR_DEPARTMENT_OTHER): Payer: Self-pay | Admitting: Emergency Medicine

## 2023-03-10 DIAGNOSIS — E785 Hyperlipidemia, unspecified: Secondary | ICD-10-CM | POA: Diagnosis present

## 2023-03-10 DIAGNOSIS — Y848 Other medical procedures as the cause of abnormal reaction of the patient, or of later complication, without mention of misadventure at the time of the procedure: Secondary | ICD-10-CM | POA: Diagnosis present

## 2023-03-10 DIAGNOSIS — E871 Hypo-osmolality and hyponatremia: Secondary | ICD-10-CM | POA: Diagnosis not present

## 2023-03-10 DIAGNOSIS — K9189 Other postprocedural complications and disorders of digestive system: Secondary | ICD-10-CM

## 2023-03-10 DIAGNOSIS — Z980 Intestinal bypass and anastomosis status: Secondary | ICD-10-CM

## 2023-03-10 DIAGNOSIS — K9589 Other complications of other bariatric procedure: Secondary | ICD-10-CM | POA: Diagnosis present

## 2023-03-10 DIAGNOSIS — K921 Melena: Secondary | ICD-10-CM | POA: Diagnosis not present

## 2023-03-10 DIAGNOSIS — K254 Chronic or unspecified gastric ulcer with hemorrhage: Secondary | ICD-10-CM | POA: Diagnosis present

## 2023-03-10 DIAGNOSIS — K289 Gastrojejunal ulcer, unspecified as acute or chronic, without hemorrhage or perforation: Secondary | ICD-10-CM

## 2023-03-10 DIAGNOSIS — Z8719 Personal history of other diseases of the digestive system: Secondary | ICD-10-CM | POA: Diagnosis not present

## 2023-03-10 DIAGNOSIS — K311 Adult hypertrophic pyloric stenosis: Secondary | ICD-10-CM | POA: Diagnosis present

## 2023-03-10 DIAGNOSIS — K922 Gastrointestinal hemorrhage, unspecified: Secondary | ICD-10-CM | POA: Diagnosis present

## 2023-03-10 DIAGNOSIS — E861 Hypovolemia: Secondary | ICD-10-CM | POA: Diagnosis not present

## 2023-03-10 DIAGNOSIS — K5900 Constipation, unspecified: Secondary | ICD-10-CM | POA: Diagnosis present

## 2023-03-10 DIAGNOSIS — K56699 Other intestinal obstruction unspecified as to partial versus complete obstruction: Secondary | ICD-10-CM | POA: Diagnosis not present

## 2023-03-10 DIAGNOSIS — G2581 Restless legs syndrome: Secondary | ICD-10-CM | POA: Diagnosis not present

## 2023-03-10 DIAGNOSIS — K2270 Barrett's esophagus without dysplasia: Secondary | ICD-10-CM | POA: Diagnosis not present

## 2023-03-10 DIAGNOSIS — D62 Acute posthemorrhagic anemia: Secondary | ICD-10-CM | POA: Diagnosis not present

## 2023-03-10 DIAGNOSIS — Z800 Family history of malignant neoplasm of digestive organs: Secondary | ICD-10-CM

## 2023-03-10 DIAGNOSIS — Z791 Long term (current) use of non-steroidal anti-inflammatories (NSAID): Secondary | ICD-10-CM | POA: Diagnosis not present

## 2023-03-10 DIAGNOSIS — T39395A Adverse effect of other nonsteroidal anti-inflammatory drugs [NSAID], initial encounter: Secondary | ICD-10-CM | POA: Diagnosis not present

## 2023-03-10 DIAGNOSIS — R1310 Dysphagia, unspecified: Secondary | ICD-10-CM | POA: Diagnosis not present

## 2023-03-10 DIAGNOSIS — Z803 Family history of malignant neoplasm of breast: Secondary | ICD-10-CM

## 2023-03-10 DIAGNOSIS — G8929 Other chronic pain: Secondary | ICD-10-CM | POA: Diagnosis not present

## 2023-03-10 DIAGNOSIS — D649 Anemia, unspecified: Secondary | ICD-10-CM

## 2023-03-10 DIAGNOSIS — K920 Hematemesis: Secondary | ICD-10-CM | POA: Diagnosis not present

## 2023-03-10 DIAGNOSIS — K315 Obstruction of duodenum: Secondary | ICD-10-CM | POA: Diagnosis not present

## 2023-03-10 DIAGNOSIS — G4733 Obstructive sleep apnea (adult) (pediatric): Secondary | ICD-10-CM | POA: Diagnosis not present

## 2023-03-10 DIAGNOSIS — Z7984 Long term (current) use of oral hypoglycemic drugs: Secondary | ICD-10-CM

## 2023-03-10 DIAGNOSIS — Z8249 Family history of ischemic heart disease and other diseases of the circulatory system: Secondary | ICD-10-CM

## 2023-03-10 DIAGNOSIS — R112 Nausea with vomiting, unspecified: Secondary | ICD-10-CM | POA: Diagnosis not present

## 2023-03-10 DIAGNOSIS — D509 Iron deficiency anemia, unspecified: Secondary | ICD-10-CM | POA: Diagnosis present

## 2023-03-10 DIAGNOSIS — E1165 Type 2 diabetes mellitus with hyperglycemia: Secondary | ICD-10-CM | POA: Diagnosis not present

## 2023-03-10 DIAGNOSIS — I2510 Atherosclerotic heart disease of native coronary artery without angina pectoris: Secondary | ICD-10-CM | POA: Diagnosis not present

## 2023-03-10 DIAGNOSIS — Z87891 Personal history of nicotine dependence: Secondary | ICD-10-CM

## 2023-03-10 DIAGNOSIS — I10 Essential (primary) hypertension: Secondary | ICD-10-CM | POA: Diagnosis present

## 2023-03-10 DIAGNOSIS — Z79899 Other long term (current) drug therapy: Secondary | ICD-10-CM

## 2023-03-10 DIAGNOSIS — K284 Chronic or unspecified gastrojejunal ulcer with hemorrhage: Secondary | ICD-10-CM | POA: Diagnosis not present

## 2023-03-10 DIAGNOSIS — Z9884 Bariatric surgery status: Secondary | ICD-10-CM | POA: Diagnosis not present

## 2023-03-10 DIAGNOSIS — K2100 Gastro-esophageal reflux disease with esophagitis, without bleeding: Secondary | ICD-10-CM | POA: Diagnosis not present

## 2023-03-10 DIAGNOSIS — G4730 Sleep apnea, unspecified: Secondary | ICD-10-CM | POA: Diagnosis present

## 2023-03-10 LAB — URINALYSIS, ROUTINE W REFLEX MICROSCOPIC
Bacteria, UA: NONE SEEN
Bilirubin Urine: NEGATIVE
Glucose, UA: >1000 mg/dL — AB
Hgb urine dipstick: NEGATIVE
Nitrite: NEGATIVE
Protein, ur: NEGATIVE mg/dL
Specific Gravity, Urine: 1.02 (ref 1.005–1.030)
pH: 6.5 (ref 5.0–8.0)

## 2023-03-10 LAB — FOLATE: Folate: 26.9 ng/mL

## 2023-03-10 LAB — CBC
HCT: 26.2 % — ABNORMAL LOW (ref 39.0–52.0)
Hemoglobin: 8.2 g/dL — ABNORMAL LOW (ref 13.0–17.0)
MCH: 28.2 pg (ref 26.0–34.0)
MCHC: 31.3 g/dL (ref 30.0–36.0)
MCV: 90 fL (ref 80.0–100.0)
Platelets: 319 10*3/uL (ref 150–400)
RBC: 2.91 MIL/uL — ABNORMAL LOW (ref 4.22–5.81)
RDW: 13.4 % (ref 11.5–15.5)
WBC: 10.8 10*3/uL — ABNORMAL HIGH (ref 4.0–10.5)
nRBC: 0 % (ref 0.0–0.2)

## 2023-03-10 LAB — VITAMIN B12: Vitamin B-12: 2048 pg/mL — ABNORMAL HIGH (ref 180–914)

## 2023-03-10 LAB — GLUCOSE, CAPILLARY: Glucose-Capillary: 208 mg/dL — ABNORMAL HIGH (ref 70–99)

## 2023-03-10 LAB — IRON AND TIBC
Iron: 27 ug/dL — ABNORMAL LOW (ref 45–182)
Saturation Ratios: 7 % — ABNORMAL LOW (ref 17.9–39.5)
TIBC: 405 ug/dL (ref 250–450)
UIBC: 378 ug/dL

## 2023-03-10 LAB — COMPREHENSIVE METABOLIC PANEL
ALT: 20 U/L (ref 0–44)
AST: 17 U/L (ref 15–41)
Albumin: 4.2 g/dL (ref 3.5–5.0)
Alkaline Phosphatase: 65 U/L (ref 38–126)
Anion gap: 10 (ref 5–15)
BUN: 27 mg/dL — ABNORMAL HIGH (ref 6–20)
CO2: 27 mmol/L (ref 22–32)
Calcium: 9.4 mg/dL (ref 8.9–10.3)
Chloride: 93 mmol/L — ABNORMAL LOW (ref 98–111)
Creatinine, Ser: 0.97 mg/dL (ref 0.61–1.24)
GFR, Estimated: >60 mL/min (ref >60)
Glucose, Bld: 234 mg/dL — ABNORMAL HIGH (ref 70–99)
Potassium: 4 mmol/L (ref 3.5–5.1)
Sodium: 130 mmol/L — ABNORMAL LOW (ref 135–145)
Total Bilirubin: 0.2 mg/dL — ABNORMAL LOW (ref 0.3–1.2)
Total Protein: 6.6 g/dL (ref 6.5–8.1)

## 2023-03-10 LAB — LIPASE, BLOOD: Lipase: 46 U/L (ref 11–51)

## 2023-03-10 LAB — RETICULOCYTES
Immature Retic Fract: 29.8 % — ABNORMAL HIGH (ref 2.3–15.9)
RBC.: 2.79 MIL/uL — ABNORMAL LOW (ref 4.22–5.81)
Retic Count, Absolute: 130.3 K/uL (ref 19.0–186.0)
Retic Ct Pct: 4.7 % — ABNORMAL HIGH (ref 0.4–3.1)

## 2023-03-10 LAB — FERRITIN: Ferritin: 5 ng/mL — ABNORMAL LOW (ref 24–336)

## 2023-03-10 MED ORDER — PANTOPRAZOLE SODIUM 40 MG IV SOLR
INTRAVENOUS | Status: AC
  Filled 2023-03-10: qty 20

## 2023-03-10 MED ORDER — ONDANSETRON HCL 4 MG/2ML IJ SOLN: 4.0000 mg | Freq: Four times a day (QID) | INTRAMUSCULAR | Status: DC | PRN

## 2023-03-10 MED ORDER — SUCRALFATE 1 GM/10ML PO SUSP
2.0000 g | Freq: Three times a day (TID) | ORAL | Status: DC
  Filled 2023-03-10: qty 20

## 2023-03-10 MED ORDER — PANTOPRAZOLE SODIUM 40 MG IV SOLR: 40.0000 mg | Freq: Two times a day (BID) | INTRAVENOUS | Status: DC

## 2023-03-10 MED ORDER — SODIUM CHLORIDE 0.9 % IV SOLN
25.0000 mg | Freq: Four times a day (QID) | INTRAVENOUS | Status: DC | PRN
  Administered 2023-03-11: 25 mg via INTRAVENOUS
  Filled 2023-03-10: qty 25
  Filled 2023-03-10: qty 1

## 2023-03-10 MED ORDER — PANTOPRAZOLE INFUSION (NEW) - SIMPLE MED
8.0000 mg/h | INTRAVENOUS | Status: DC
  Administered 2023-03-10: 8 mg/h via INTRAVENOUS
  Filled 2023-03-10 (×2): qty 100

## 2023-03-10 MED ORDER — ONDANSETRON HCL 4 MG/2ML IJ SOLN
4.0000 mg | Freq: Once | INTRAMUSCULAR | Status: AC
  Administered 2023-03-10: 4 mg via INTRAVENOUS
  Filled 2023-03-10: qty 2

## 2023-03-10 MED ORDER — PANTOPRAZOLE 80MG IVPB - SIMPLE MED
80.0000 mg | Freq: Once | INTRAVENOUS | Status: AC
  Administered 2023-03-10: 80 mg via INTRAVENOUS
  Filled 2023-03-10: qty 100

## 2023-03-10 NOTE — ED Notes (Signed)
ED TO INPATIENT HANDOFF REPORT  ED Nurse Name and Phone #: Oliva Bustard RN [phone]  S Name/Age/Gender Jeremy [name] 62 y.o. male Room/Bed: DB005/DB005  Code Status   Code Status: Prior  Home/SNF/Other Home Patient oriented to: self, place, time, and situation Is this baseline? Yes   Triage Complete: Triage complete  Chief Complaint Upper GI bleeding [K92.2]  Triage Note Pt presents to ED POV. Pt c/o emesis since last night. Pt reports that emesis x3 in the last 24h. Reports coffee ground appearance. Weakness since Wednesday. Pt reports that he has been anemic in the past and this feels same.    Allergies Allergies  Allergen Reactions   Cymbalta [Duloxetine Hcl] Other (See Comments)    Caused anorgasmia   Doxycycline     Unknown   Other Nausea And Vomiting    Mayonnaise mustard ketchup   Robaxin [Methocarbamol] Other (See Comments)    Had hiccups x 4 hours after taking    Level of Care/Admitting Diagnosis ED Disposition     ED Disposition  Admit   Condition  --   Comment  Hospital Area: [facility] [[id number]]  Level of Care: Med-Surg [16]  May admit patient to [facility] or [facility] if equivalent level of care is available:: No  Interfacility transfer: Yes  Covid Evaluation: Asymptomatic - no recent exposure (last 10 days) testing not required  Diagnosis: Upper GI bleeding [132440]  Admitting Physician: Imogene Burn, ERIC [[id number]]  Attending Physician: Imogene Burn, ERIC [[id number]]  Certification:: I certify this patient will need inpatient services for at least 2 midnights  Estimated Length of Stay: 2          B Medical/Surgery History Past Medical History:  Diagnosis Date   Diabetes mellitus without complication (HCC)    GERD (gastroesophageal reflux disease)    Hyperlipidemia    Hypertension    IDA (iron deficiency anemia)    Rash of entire body 03/2016   Sleep apnea    Past Surgical History:  Procedure Laterality Date   BACK SURGERY  1993    lumbar   BIOPSY  02/07/2019   Procedure: BIOPSY;  Surgeon: West Bali, MD;  [facility];  Service: Endoscopy;;   BIOPSY  01/30/2022   Procedure: BIOPSY;  Surgeon: Lanelle Bal, DO;  [facility];  Service: Endoscopy;;   CERVICAL SPINE SURGERY  2000   COLONOSCOPY N/A 02/07/2019   Dr. Darrick Penna: External hemorrhoids next colonoscopy in 10 years   ESOPHAGOGASTRODUODENOSCOPY N/A 02/07/2019   Dr. Darrick Penna: Barrett's esophagus without dysplasia chronic inactive gastritis but no H. 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, nonbleeding duodenal diverticulum,   ESOPHAGOGASTRODUODENOSCOPY N/A 01/05/2020   Procedure: ESOPHAGOGASTRODUODENOSCOPY (EGD);  Surgeon: West Bali, MD;  [facility];  Service: Endoscopy;  Laterality: N/A;  10:30am   ESOPHAGOGASTRODUODENOSCOPY (EGD) WITH PROPOFOL N/A 12/06/2020   Procedure: ESOPHAGOGASTRODUODENOSCOPY (EGD) WITH PROPOFOL;  Surgeon: Lanelle Bal, DO;  [facility];  Service: Endoscopy;  Laterality: N/A;  11:45am   ESOPHAGOGASTRODUODENOSCOPY (EGD) WITH PROPOFOL N/A 12/22/2021   long-segment Barrett's s/p biopsy, gastric bypass with normal sized puch and intact staple line. GJ anastomosis with healthy mucosa. Gastritis.   ESOPHAGOGASTRODUODENOSCOPY (EGD) WITH PROPOFOL N/A 01/30/2022   Procedure: ESOPHAGOGASTRODUODENOSCOPY (EGD) WITH PROPOFOL;  Surgeon: Lanelle Bal, DO;  [facility];  Service: Endoscopy;  Laterality: N/A;  11:00am   GASTROJEJUNOSTOMY N/A 01/03/2021   Procedure: GASTROJEJUNOSTOMY;  Surgeon: [name], MD;  Location: [facility];  Service: General;  Laterality: N/A;   HAND SURGERY     SAVORY DILATION N/A 01/05/2020   Procedure: SAVORY DILATION;  Surgeon: West Bali, MD;  [facility];  Service: Endoscopy;  Laterality: N/A;     A IV Location/Drains/Wounds Patient Lines/Drains/Airways Status     Active  Line/Drains/Airways     Name Placement date Placement time Site Days   Peripheral IV 03/10/23 20 G Anterior;Distal;Left;Upper Arm 03/10/23  1757  Arm  less than 1            Intake/Output Last 24 hours  Intake/Output Summary (Last 24 hours) at 03/10/2023 2043 Last data filed at 03/10/2023 1930 Gross per 24 hour  Intake 98.42 ml  Output --  Net 98.42 ml    Labs/Imaging Results for orders placed or performed during the hospital encounter of 03/10/23 (from the past 48 hour(s))  Lipase, blood     Status: None   Collection Time: 03/10/23  5:41 PM  Result Value Ref Range   Lipase 46 11 - 51 U/L    Comment: Performed at [facility], [address] [place], [place] [address]  Comprehensive metabolic panel     Status: Abnormal   Collection Time: 03/10/23  5:41 PM  Result Value Ref Range   Sodium 130 (L) 135 - 145 mmol/L   Potassium 4.0 3.5 - 5.1 mmol/L   Chloride 93 (L) 98 - 111 mmol/L   CO2 27 22 - 32 mmol/L   Glucose, Bld 234 (H) 70 - 99 mg/dL    Comment: Glucose reference range applies only to samples taken after fasting for at least 8 hours.   BUN 27 (H) 6 - 20 mg/dL   Creatinine, Ser 6.04 0.61 - 1.24 mg/dL   Calcium 9.4 8.9 - 54.0 mg/dL   Total Protein 6.6 6.5 - 8.1 g/dL   Albumin 4.2 3.5 - 5.0 g/dL   AST 17 15 - 41 U/L   ALT 20 0 - 44 U/L   Alkaline Phosphatase 65 38 - 126 U/L   Total Bilirubin 0.2 (L) 0.3 - 1.2 mg/dL   GFR, Estimated >98 >11 mL/min    Comment: (NOTE) Calculated using the CKD-EPI Creatinine Equation (2021)    Anion gap 10 5 - 15    Comment: Performed at [facility], [address], [place], [place] [address]  CBC     Status: Abnormal   Collection Time: 03/10/23  5:41 PM  Result Value Ref Range   WBC 10.8 (H) 4.0 - 10.5 K/uL   RBC 2.91 (L) 4.22 - 5.81 MIL/uL   Hemoglobin 8.2 (L) 13.0 - 17.0 g/dL   HCT 29.5 (L) 62.1 - 30.8 %   MCV 90.0 80.0 - 100.0 fL   MCH 28.2 26.0 - 34.0 pg   MCHC 31.3 30.0 - 36.0  g/dL   RDW 65.7 84.6 - 96.2 %   Platelets 319 150 - 400 K/uL   nRBC 0.0 0.0 - 0.2 %    Comment: Performed at [facility], [address] [place], [place] [address]  Ferritin     Status: Abnormal   Collection Time: 03/10/23  6:05 PM  Result Value Ref Range   Ferritin 5 (L) 24 - 336 ng/mL    Comment: Performed at [facility], [address], [place], [place] [address]  Reticulocytes     Status: Abnormal   Collection Time: 03/10/23  6:05 PM  Result  Value Ref Range   Retic Ct Pct 4.7 (H) 0.4 - 3.1 %   RBC. 2.79 (L) 4.22 - 5.81 MIL/uL   Retic Count, Absolute 130.3 19.0 - 186.0 K/uL   Immature Retic Fract 29.8 (H) 2.3 - 15.9 %    Comment: Performed at [facility], [address], [place], [place] [address]   No results found.  Pending Labs Unresulted Labs (From admission, onward)     Start     Ordered   03/10/23 1805  Vitamin B12  (Anemia Panel (PNL))  Once,   URGENT        03/10/23 1804   03/10/23 1805  Folate  (Anemia Panel (PNL))  Once,   URGENT        03/10/23 1804   03/10/23 1805  Iron and TIBC  (Anemia Panel (PNL))  Once,   URGENT        03/10/23 1804   03/10/23 1741  Urinalysis, Routine w reflex microscopic -Urine, Clean Catch  Once,   URGENT       Question:  Specimen Source  Answer:  Urine, Clean Catch   03/10/23 1740            Vitals/Pain Today's Vitals   03/10/23 1945 03/10/23 2000 03/10/23 2015 03/10/23 2030  BP: 105/68 (!) 122/41 (!) 137/51 (!) 129/53  Pulse: 94 95 92 (!) 104  Resp:      Temp:      TempSrc:      SpO2: 100% 99% 100% 99%  PainSc:        Isolation Precautions No active isolations  Medications Medications  pantoprazole (PROTONIX) 40 MG injection (  Not Given 03/10/23 1919)  sucralfate (CARAFATE) 1 GM/10ML suspension 2 g (has no administration in time range)  pantoprozole (PROTONIX) 80 mg /NS 100 mL infusion (8 mg/hr Intravenous New Bag/Given 03/10/23 1948)  pantoprazole  (PROTONIX) injection 40 mg (has no administration in time range)  promethazine (PHENERGAN) 25 mg in sodium chloride 0.9 % 50 mL IVPB (has no administration in time range)  pantoprazole (PROTONIX) 80 mg /NS 100 mL IVPB (0 mg Intravenous Stopped 03/10/23 1917)  ondansetron (ZOFRAN) injection 4 mg (4 mg Intravenous Given 03/10/23 1857)    Mobility Ambulatory at baseline, feeling dizzy upon standing. Been transporting pt to bathroom via wheelchair.     Focused Assessments Cardiac Assessment Handoff:    No results found for: "CKTOTAL", "CKMB", "CKMBINDEX", "TROPONINI" No results found for: "DDIMER" Does the Patient currently have chest pain? No    R Recommendations: See Admitting Provider Note  Report given to:   Additional Notes:

## 2023-03-10 NOTE — ED Provider Notes (Addendum)
[place] EMERGENCY DEPARTMENT AT [facility] Provider Note   CSN: [id number] Arrival date & time: 03/10/23  1715     History  Chief Complaint  Patient presents with   Emesis    Jeremy [name] is a 62 y.o. male.  HPI    62 year old patient comes in with chief complaint generalized weakness.  Patient has history of diabetes, hypertension, GERD, Barrett's esophagitis, gastritis and Roux-en-Y gastric bypass surgery in 2022.  Patient also admits to taking Naprosyn twice a day for several years now.  Patient states that he started feeling generalized weakness earlier this week and it has only worsened.  He feels like his iron has likely bottomed out, as that is how he felt when he was diagnosed with iron deficiency anemia.  Patient also states that over the last 24 hours he has had 3 episodes of coffee-ground emesis.  He denies any bloody stools.  Review of system is negative for abdominal pain.  Patient gets his GI care at [facility].  Home Medications Prior to Admission medications   Medication Sig Start Date End Date Taking? Authorizing Provider  acetaminophen (TYLENOL) 500 MG tablet Take 1,000 mg by mouth in the morning and at bedtime.     [provider]  Blood Glucose Monitoring Suppl (TRUE METRIX METER) w/Device KIT 1 each by Does not apply route 4 (four) times daily -  before meals and at bedtime. 04/12/16   Storm Frisk, MD  cyclobenzaprine (FLEXERIL) 10 MG tablet Take 1 tablet (10 mg total) by mouth 2 (two) times daily as needed for muscle spasms. 11/07/22   Marcine Matar, MD  ferrous sulfate (FEROSUL) 325 (65 FE) MG tablet Take 1 tablet (325 mg total) by mouth 2 (two) times daily with a meal. 01/15/23   Marcine Matar, MD  fluticasone (FLONASE) 50 MCG/ACT nasal spray Place 1 spray into both nostrils daily as needed for allergies or rhinitis. 01/15/23   Marcine Matar, MD  glipiZIDE (GLUCOTROL) 5 MG tablet Take 1 tablet (5 mg  total) in the morning AND 1 tablet (5 mg total) every evening. 02/20/23   Marcine Matar, MD  glucose blood test strip Use as instructed 07/28/20   Marcine Matar, MD  lisinopril (ZESTRIL) 20 MG tablet Take 1 tablet (20 mg total) by mouth in the morning and at bedtime. 02/20/23   Marcine Matar, MD  loperamide (IMODIUM A-D) 2 MG tablet Take 2 mg by mouth 4 (four) times daily as needed for diarrhea or loose stools. Patient not taking: Reported on 03/01/2023    [provider]  loratadine (CLARITIN) 10 MG tablet Take 10 mg by mouth daily.    [provider]  Magnesium Oxide 200 MG TABS Take 200 mg by mouth in the morning and at bedtime.    [provider]  naproxen sodium (ALEVE) 220 MG tablet Take 440 mg by mouth in the morning and at bedtime.    [provider]  ondansetron (ZOFRAN) 8 MG tablet Take 1 tablet (8 mg total) by mouth daily as needed for nausea or vomiting. 05/09/22   Marcine Matar, MD  pantoprazole (PROTONIX) 40 MG tablet Take 1 tablet (40 mg total) by mouth 2 (two) times daily before a meal. 07/17/22   Marcine Matar, MD  Potassium 99 MG TABS Take 99 mg by mouth daily at 6 (six) AM.    [provider]  rOPINIRole (REQUIP) 1 MG tablet Take 1  tablet (1 mg total) by mouth at bedtime. 03/04/23   Marcine Matar, MD  sildenafil (VIAGRA) 50 MG tablet Take 1 tablet (50 mg total) by mouth 1/2 hr before intercourse.  Limit use to 1 tab/24 hr 12/25/22   Marcine Matar, MD  traMADol (ULTRAM) 50 MG tablet Take 1 tablet (50 mg total) by mouth every 8 (eight) hours as needed 02/07/23   Marcine Matar, MD  vitamin B-12 (CYANOCOBALAMIN) 1000 MCG tablet Take 1,000 mcg by mouth daily.    [provider]      Allergies    Cymbalta [duloxetine hcl], Doxycycline, Other, and Robaxin [methocarbamol]    Review of Systems   Review of Systems  All other systems reviewed and are negative.   Physical Exam Updated Vital Signs BP  (!) 124/49   Pulse 89   Temp 98.2 F (36.8 C) (Oral)   Resp 20   SpO2 100%  Physical Exam Vitals and nursing note reviewed.  Constitutional:      Appearance: He is well-developed.  HENT:     Head: Atraumatic.  Cardiovascular:     Rate and Rhythm: Tachycardia present.  Pulmonary:     Effort: Pulmonary effort is normal.  Musculoskeletal:     Cervical back: Neck supple.  Skin:    General: Skin is warm.  Neurological:     Mental Status: He is alert and oriented to person, place, and time.     ED Results / Procedures / Treatments   Labs (all labs ordered are listed, but only abnormal results are displayed) Labs Reviewed  COMPREHENSIVE METABOLIC PANEL - Abnormal; Notable for the following components:      Result Value   Sodium 130 (*)    Chloride 93 (*)    Glucose, Bld 234 (*)    BUN 27 (*)    Total Bilirubin 0.2 (*)    All other components within normal limits  CBC - Abnormal; Notable for the following components:   WBC 10.8 (*)    RBC 2.91 (*)    Hemoglobin 8.2 (*)    HCT 26.2 (*)    All other components within normal limits  RETICULOCYTES - Abnormal; Notable for the following components:   Retic Ct Pct 4.7 (*)    RBC. 2.79 (*)    Immature Retic Fract 29.8 (*)    All other components within normal limits  LIPASE, BLOOD  URINALYSIS, ROUTINE W REFLEX MICROSCOPIC  VITAMIN B12  FOLATE  IRON AND TIBC  FERRITIN    EKG EKG Interpretation  Date/Time:  Saturday Mar 10 2023 20:42:00 EDT Ventricular Rate:  95 PR Interval:  165 QRS Duration: 92 QT Interval:  326 QTC Calculation: 410 R Axis:   76 Text Interpretation: Sinus rhythm No acute changes No significant change since last tracing Confirmed by Derwood Kaplan [phone]) on 03/10/2023 8:58:41 PM  Radiology No results found.  Procedures Procedures    Medications Ordered in ED Medications  pantoprazole (PROTONIX) 40 MG injection (  Not Given 03/10/23 1919)  ondansetron (ZOFRAN) injection 4 mg (has no  administration in time range)  sucralfate (CARAFATE) 1 GM/10ML suspension 2 g (has no administration in time range)  pantoprozole (PROTONIX) 80 mg /NS 100 mL infusion (8 mg/hr Intravenous New Bag/Given 03/10/23 1948)  pantoprazole (PROTONIX) injection 40 mg (has no administration in time range)  pantoprazole (PROTONIX) 40 MG injection (has no administration in time range)  pantoprazole (PROTONIX) 80 mg /NS 100 mL IVPB (0 mg Intravenous Stopped 03/10/23 1917)  ondansetron [facility]) injection 4 mg (4 mg Intravenous Given 03/10/23 1857)    ED Course/ Medical Decision Making/ A&P Clinical Course as of 03/10/23 1954  Sat Mar 10, 2023  1953 Hemoglobin(!): 8.2 Hemoglobin is dropped almost 3 g over the last 2 months. We will send iron studies. At this time he does not need transfusion.  At this time he is not having any active bleeding.  No melena at any point.  BUN is slightly elevated at 27. Stable for admission. [AN]  1954 Case discussed with Dr. Ewing Schlein.  He wants patient to get Protonix and also Carafate. [AN]    Clinical Course User Index [AN] Derwood Kaplan, MD                             Medical Decision Making Amount and/or Complexity of Data Reviewed Labs: ordered. Decision-making details documented in ED Course.  Risk Prescription drug management. Decision regarding hospitalization.  62 year old patient comes in with chief complaint of generalized malaise.  He has history of gastric bypass surgery, iron deficiency anemia, gastritis/esophagitis and chronic GERD and chronic NSAID use.  On exam, patient has no focal abdominal tenderness.  Review of system is not concerning for any specific source of infection.  Differential diagnosis considered for this patient includes viral syndrome, iron deficiency anemia, acute blood loss anemia with upper GI bleed as the cause for dark emesis, severe electrolyte abnormality, renal failure, dehydration.  Initial plan is to get basic labs.  I  have reviewed patient's chart including upper endoscopy results from GI service.    Final Clinical Impression(s) / ED Diagnoses Final diagnoses:  Coffee ground emesis  Symptomatic anemia    Rx / DC Orders ED Discharge Orders     None          Derwood Kaplan, MD 03/10/23 2059

## 2023-03-10 NOTE — ED Triage Notes (Signed)
Pt presents to ED POV. Pt c/o emesis since last night. Pt reports that emesis x3 in the last 24h. Reports coffee ground appearance. Weakness since Wednesday. Pt reports that he has been anemic in the past and this feels same.

## 2023-03-10 NOTE — Progress Notes (Signed)
   Patient Name: Jeremy [name], Jeremy [name] DOB: [birth year] MRN: [id number] Transferring facility: [facility] Requesting provider: nanavati, md Reason for transfer: UGI bleed 62 yo WM with hx of DM, HTN, GERD, barrett's, on naprosyn bid. hx of Roux-en-Y in 2022. +fatigue. 3 episodes of hematemesis in 24 hours. BUN 27, SCr 0.97. [facility]([place]). started on protonix gtts. Going to: [facility] Admission Status: inpatient Bed Type: med/surg To Do:  [facility] will assume care on arrival to accepting facility. Until arrival, medical decision making responsibilities remain with the EDP.  However, [facility] available 24/7 for questions and assistance.   Nursing staff please page [facility] Admits and Consults [phone]) as soon as the patient arrives to the hospital.  Carollee Herter, DO [facility]

## 2023-03-10 NOTE — ED Notes (Signed)
[facility] at bedside to transport pt to [place] 

## 2023-03-11 ENCOUNTER — Inpatient Hospital Stay (HOSPITAL_COMMUNITY): Payer: Medicaid Other | Admitting: Certified Registered Nurse Anesthetist

## 2023-03-11 ENCOUNTER — Encounter (HOSPITAL_COMMUNITY): Payer: Self-pay | Admitting: Internal Medicine

## 2023-03-11 ENCOUNTER — Encounter (HOSPITAL_COMMUNITY)
Admission: EM | Disposition: A | Discharge status: Home / Self Care | Payer: Self-pay | Source: Home / Self Care | Attending: Internal Medicine

## 2023-03-11 ENCOUNTER — Other Ambulatory Visit: Payer: Self-pay

## 2023-03-11 DIAGNOSIS — Z87891 Personal history of nicotine dependence: Secondary | ICD-10-CM

## 2023-03-11 DIAGNOSIS — Z791 Long term (current) use of non-steroidal anti-inflammatories (NSAID): Secondary | ICD-10-CM

## 2023-03-11 DIAGNOSIS — K921 Melena: Secondary | ICD-10-CM | POA: Diagnosis not present

## 2023-03-11 DIAGNOSIS — K315 Obstruction of duodenum: Secondary | ICD-10-CM | POA: Diagnosis not present

## 2023-03-11 DIAGNOSIS — I2510 Atherosclerotic heart disease of native coronary artery without angina pectoris: Secondary | ICD-10-CM | POA: Diagnosis not present

## 2023-03-11 DIAGNOSIS — K2100 Gastro-esophageal reflux disease with esophagitis, without bleeding: Secondary | ICD-10-CM

## 2023-03-11 DIAGNOSIS — D62 Acute posthemorrhagic anemia: Secondary | ICD-10-CM | POA: Diagnosis not present

## 2023-03-11 DIAGNOSIS — K5900 Constipation, unspecified: Secondary | ICD-10-CM | POA: Diagnosis not present

## 2023-03-11 DIAGNOSIS — Z980 Intestinal bypass and anastomosis status: Secondary | ICD-10-CM | POA: Diagnosis not present

## 2023-03-11 DIAGNOSIS — R1310 Dysphagia, unspecified: Secondary | ICD-10-CM | POA: Diagnosis not present

## 2023-03-11 DIAGNOSIS — K2270 Barrett's esophagus without dysplasia: Secondary | ICD-10-CM

## 2023-03-11 DIAGNOSIS — I10 Essential (primary) hypertension: Secondary | ICD-10-CM | POA: Diagnosis not present

## 2023-03-11 DIAGNOSIS — K922 Gastrointestinal hemorrhage, unspecified: Secondary | ICD-10-CM | POA: Diagnosis not present

## 2023-03-11 DIAGNOSIS — K920 Hematemesis: Principal | ICD-10-CM

## 2023-03-11 HISTORY — PX: ESOPHAGOGASTRODUODENOSCOPY (EGD) WITH PROPOFOL: SHX5813

## 2023-03-11 LAB — COMPREHENSIVE METABOLIC PANEL
ALT: 23 U/L (ref 0–44)
AST: 18 U/L (ref 15–41)
Albumin: 3 g/dL — ABNORMAL LOW (ref 3.5–5.0)
Alkaline Phosphatase: 51 U/L (ref 38–126)
Anion gap: 7 (ref 5–15)
BUN: 21 mg/dL — ABNORMAL HIGH (ref 6–20)
CO2: 20 mmol/L — ABNORMAL LOW (ref 22–32)
Calcium: 8 mg/dL — ABNORMAL LOW (ref 8.9–10.3)
Chloride: 99 mmol/L (ref 98–111)
Creatinine, Ser: 0.91 mg/dL (ref 0.61–1.24)
GFR, Estimated: >60 mL/min (ref >60)
Glucose, Bld: 124 mg/dL — ABNORMAL HIGH (ref 70–99)
Potassium: 4.3 mmol/L (ref 3.5–5.1)
Sodium: 126 mmol/L — ABNORMAL LOW (ref 135–145)
Total Bilirubin: 0.7 mg/dL (ref 0.3–1.2)
Total Protein: 5.6 g/dL — ABNORMAL LOW (ref 6.5–8.1)

## 2023-03-11 LAB — PREPARE RBC (CROSSMATCH)

## 2023-03-11 LAB — GLUCOSE, CAPILLARY: Glucose-Capillary: 121 mg/dL — ABNORMAL HIGH (ref 70–99)

## 2023-03-11 LAB — PROTIME-INR
INR: 1.2 (ref 0.8–1.2)
Prothrombin Time: 15.4 seconds — ABNORMAL HIGH (ref 11.4–15.2)

## 2023-03-11 LAB — CBC
HCT: 21.3 % — ABNORMAL LOW (ref 39.0–52.0)
HCT: 27.5 % — ABNORMAL LOW (ref 39.0–52.0)
Hemoglobin: 6.6 g/dL — CL (ref 13.0–17.0)
Hemoglobin: 8.7 g/dL — ABNORMAL LOW (ref 13.0–17.0)
MCH: 27.8 pg (ref 26.0–34.0)
MCH: 28.1 pg (ref 26.0–34.0)
MCHC: 31 g/dL (ref 30.0–36.0)
MCHC: 31.6 g/dL (ref 30.0–36.0)
MCV: 89.9 fL (ref 80.0–100.0)
MCV: 88.7 fL (ref 80.0–100.0)
Platelets: 237 10*3/uL (ref 150–400)
Platelets: 205 10*3/uL (ref 150–400)
RBC: 2.37 MIL/uL — ABNORMAL LOW (ref 4.22–5.81)
RBC: 3.1 MIL/uL — ABNORMAL LOW (ref 4.22–5.81)
RDW: 13.3 % (ref 11.5–15.5)
RDW: 14.3 % (ref 11.5–15.5)
WBC: 8.1 10*3/uL (ref 4.0–10.5)
WBC: 5.6 10*3/uL (ref 4.0–10.5)
nRBC: 0 % (ref 0.0–0.2)
nRBC: 0 % (ref 0.0–0.2)

## 2023-03-11 LAB — TYPE AND SCREEN: Antibody Screen: NEGATIVE

## 2023-03-11 LAB — HEMOGLOBIN AND HEMATOCRIT, BLOOD
HCT: 24.1 % — ABNORMAL LOW (ref 39.0–52.0)
Hemoglobin: 7.3 g/dL — ABNORMAL LOW (ref 13.0–17.0)

## 2023-03-11 LAB — BPAM RBC
Blood Product Expiration Date: 202406212359
ISSUE DATE / TIME: 202405261715

## 2023-03-11 LAB — PHOSPHORUS: Phosphorus: 2.5 mg/dL (ref 2.5–4.6)

## 2023-03-11 LAB — MAGNESIUM: Magnesium: 2 mg/dL (ref 1.7–2.4)

## 2023-03-11 LAB — HIV ANTIBODY (ROUTINE TESTING W REFLEX): HIV Screen 4th Generation wRfx: NONREACTIVE

## 2023-03-11 SURGERY — ESOPHAGOGASTRODUODENOSCOPY (EGD) WITH PROPOFOL
Anesthesia: Monitor Anesthesia Care

## 2023-03-11 MED ORDER — LIDOCAINE 2% (20 MG/ML) 5 ML SYRINGE
INTRAMUSCULAR | Status: DC | PRN
  Administered 2023-03-11: 40 mg via INTRAVENOUS

## 2023-03-11 MED ORDER — SODIUM CHLORIDE 0.9 % IV SOLN: INTRAVENOUS | Status: DC

## 2023-03-11 MED ORDER — PROPOFOL 500 MG/50ML IV EMUL
INTRAVENOUS | Status: DC | PRN
  Administered 2023-03-11: 180 ug/kg/min via INTRAVENOUS

## 2023-03-11 MED ORDER — SODIUM CHLORIDE 0.9 % IV SOLN
510.0000 mg | Freq: Once | INTRAVENOUS | Status: AC
  Administered 2023-03-11: 510 mg via INTRAVENOUS
  Filled 2023-03-11: qty 17

## 2023-03-11 MED ORDER — SIMETHICONE 80 MG PO CHEW
160.0000 mg | CHEWABLE_TABLET | Freq: Four times a day (QID) | ORAL | Status: AC
  Administered 2023-03-11 (×3): 160 mg via ORAL
  Filled 2023-03-11 (×3): qty 2

## 2023-03-11 MED ORDER — PROPOFOL 10 MG/ML IV BOLUS
INTRAVENOUS | Status: DC | PRN
  Administered 2023-03-11: 20 mg via INTRAVENOUS

## 2023-03-11 MED ORDER — POLYETHYLENE GLYCOL 3350 17 G PO PACK: 17.0000 g | PACK | Freq: Every day | ORAL | Status: DC | PRN

## 2023-03-11 MED ORDER — LIDOCAINE 5 % EX PTCH
1.0000 | MEDICATED_PATCH | CUTANEOUS | Status: DC
  Administered 2023-03-11 – 2023-03-12 (×2): 1 via TRANSDERMAL
  Filled 2023-03-11 (×2): qty 1

## 2023-03-11 MED ORDER — INSULIN ASPART 100 UNIT/ML IJ SOLN
0.0000 [IU] | INTRAMUSCULAR | Status: DC
  Administered 2023-03-11: 3 [IU] via SUBCUTANEOUS

## 2023-03-11 MED ORDER — SODIUM CHLORIDE 0.9% IV SOLUTION: Freq: Once | INTRAVENOUS | Status: AC

## 2023-03-11 MED ORDER — ACETAMINOPHEN 325 MG PO TABS: 650.0000 mg | ORAL_TABLET | Freq: Four times a day (QID) | ORAL | Status: DC | PRN

## 2023-03-11 MED ORDER — VITAMIN B-12 1000 MCG PO TABS
1000.0000 ug | ORAL_TABLET | Freq: Every day | ORAL | Status: DC
  Administered 2023-03-12: 1000 ug via ORAL
  Filled 2023-03-11 (×2): qty 1

## 2023-03-11 MED ORDER — LACTATED RINGERS IV SOLN: INTRAVENOUS | Status: DC | PRN

## 2023-03-11 MED ORDER — FLUTICASONE PROPIONATE 50 MCG/ACT NA SUSP: 1.0000 | Freq: Every day | NASAL | Status: DC | PRN

## 2023-03-11 MED ORDER — TRAMADOL HCL 50 MG PO TABS: 50.0000 mg | ORAL_TABLET | Freq: Four times a day (QID) | ORAL | Status: DC | PRN

## 2023-03-11 MED ORDER — SUCRALFATE 1 GM/10ML PO SUSP
1.0000 g | Freq: Three times a day (TID) | ORAL | Status: DC
  Administered 2023-03-11 – 2023-03-12 (×3): 1 g via ORAL
  Filled 2023-03-11 (×3): qty 10

## 2023-03-11 MED ORDER — ROPINIROLE HCL 1 MG PO TABS
1.0000 mg | ORAL_TABLET | Freq: Every day | ORAL | Status: DC
  Administered 2023-03-11 (×2): 1 mg via ORAL
  Filled 2023-03-11 (×3): qty 1

## 2023-03-11 SURGICAL SUPPLY — 15 items

## 2023-03-11 NOTE — Progress Notes (Signed)
Patient seen and examined.  Currently hemodynamically stable.  Admitted early morning hours by nighttime hospitalist.  Denies any complaints.  Anxious about the procedure.  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  He has chronic pain issues and takes Naprosyn.  Acute blood loss anemia, acute upper GI bleeding.  Presented with hematemesis. Presentation hemoglobin 8.2-6.6.  Baseline hemoglobin 10-11. 1 unit PRBC return on admission, will write 1 further unit of blood transfusion.  Total 2 units today.  Continue IV fluids.  NPO.  Protonix 40 mg IV twice daily.  Suspected marginal ulcer.  GI on board.  EGD planned today.   Same-day admit.  No charge visit. Patient consented for blood transfusion.

## 2023-03-11 NOTE — Anesthesia Preprocedure Evaluation (Addendum)
Anesthesia Evaluation  Patient identified by MRN, date of birth, ID band Patient awake    Reviewed: Allergy & Precautions, NPO status , Patient's Chart, lab work & pertinent test results  Airway Mallampati: II  TM Distance: >3 FB Neck ROM: Full    Dental  (+) Poor Dentition, Chipped, Missing   Pulmonary sleep apnea , former smoker   Pulmonary exam normal        Cardiovascular hypertension, Pt. on medications + CAD  Normal cardiovascular exam     Neuro/Psych  Neuromuscular disease  negative psych ROS   GI/Hepatic Neg liver ROS,GERD  Medicated and Controlled,,  Endo/Other  diabetes, Oral Hypoglycemic Agents  Hyponatremia   Renal/GU negative Renal ROS     Musculoskeletal  (+) Arthritis ,    Abdominal   Peds  Hematology negative hematology ROS (+)   Anesthesia Other Findings Anemia Coffee Ground Emesis  Reproductive/Obstetrics                             Anesthesia Physical Anesthesia Plan  ASA: 3  Anesthesia Plan: MAC   Post-op Pain Management:    Induction: Intravenous  PONV Risk Score and Plan: 1 and Propofol infusion and Treatment may vary due to age or medical condition  Airway Management Planned: Nasal Cannula  Additional Equipment:   Intra-op Plan:   Post-operative Plan:   Informed Consent: I have reviewed the patients History and Physical, chart, labs and discussed the procedure including the risks, benefits and alternatives for the proposed anesthesia with the patient or authorized representative who has indicated his/her understanding and acceptance.     Dental advisory given  Plan Discussed with: CRNA  Anesthesia Plan Comments:         Anesthesia Quick Evaluation

## 2023-03-11 NOTE — H&P (Addendum)
History and Physical  Jeremy [name]:[id number] DOB: September 05, 1961 DOA: 03/10/2023  Referring physician: Accepted by Dr. Imogene Burn, [facility], Hospitalist service.  PCP: Marcine Matar, MD  Outpatient Specialists: Hematology, GI Patient coming from: Home via [facility] ED  Chief Complaint: Vomiting dark blood   HPI: Jeremy [name] is a 62 y.o. male with medical history significant for GERD, Barrett's esophagus, chronic duodenal stricture, Roux-en-Y gastric bypass surgery in 2022, iron deficiency anemia, type 2 diabetes, hypertension, restless leg syndrome, OSA on CPAP, chronic shoulder pain on naproxen twice daily, who initially presented to [facility] ED with complaints of vomiting dark blood x 4 times for the past 24 hours.  Associated with progressive generalized weakness.  Onset a week ago, recurred last night around 7:30-8 PM x 2, then again at [facility] ED x 2.  In the ED, noted to have acute blood loss anemia with Hg 8 K from 11 K, 3 months ago.  [facility] was consulted to admit.  Accepted by Dr. Imogene Burn, Protonix drip started and GI consulted by [facility] MD.  At time of this visit, the patient complains of feeling bloated and having restless legs.  No anginal symptoms reported.  IV iron infusion and simethicone ordered.  ED Course: Temperature 99.3.  BP 137/51, pulse 100, respiration rate 16, saturation 100% on room air.  Lab studies notable for WBC 10.8, hemoglobin 8.2, platelet count 319.  Iron level 27, trans sat 7, ferritin 5.  Review of Systems: Review of systems as noted in the HPI. All other systems reviewed and are negative.   Past Medical History:  Diagnosis Date   Diabetes mellitus without complication (HCC)    GERD (gastroesophageal reflux disease)    Hyperlipidemia    Hypertension    IDA (iron deficiency anemia)    Rash of entire body 03/2016   Sleep apnea    Past Surgical History:  Procedure Laterality Date   BACK SURGERY  1993   lumbar   BIOPSY  02/07/2019   Procedure: BIOPSY;   Surgeon: West Bali, MD;  Location: [facility];  Service: Endoscopy;;   BIOPSY  01/30/2022   Procedure: BIOPSY;  Surgeon: Lanelle Bal, DO;  Location: [facility];  Service: Endoscopy;;   CERVICAL SPINE SURGERY  2000   COLONOSCOPY N/A 02/07/2019   Dr. Darrick Penna: External hemorrhoids next colonoscopy in 10 years   ESOPHAGOGASTRODUODENOSCOPY N/A 02/07/2019   Dr. Darrick Penna: Barrett's esophagus without dysplasia chronic inactive gastritis but no H. 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, nonbleeding duodenal diverticulum,   ESOPHAGOGASTRODUODENOSCOPY N/A 01/05/2020   Procedure: ESOPHAGOGASTRODUODENOSCOPY (EGD);  Surgeon: West Bali, MD;  Location: [facility];  Service: Endoscopy;  Laterality: N/A;  10:30am   ESOPHAGOGASTRODUODENOSCOPY (EGD) WITH PROPOFOL N/A 12/06/2020   Procedure: ESOPHAGOGASTRODUODENOSCOPY (EGD) WITH PROPOFOL;  Surgeon: Lanelle Bal, DO;  Location: [facility];  Service: Endoscopy;  Laterality: N/A;  11:45am   ESOPHAGOGASTRODUODENOSCOPY (EGD) WITH PROPOFOL N/A 12/22/2021   long-segment Barrett's s/p biopsy, gastric bypass with normal sized puch and intact staple line. GJ anastomosis with healthy mucosa. Gastritis.   ESOPHAGOGASTRODUODENOSCOPY (EGD) WITH PROPOFOL N/A 01/30/2022   Procedure: ESOPHAGOGASTRODUODENOSCOPY (EGD) WITH PROPOFOL;  Surgeon: Lanelle Bal, DO;  Location: [facility];  Service: Endoscopy;  Laterality: N/A;  11:00am   GASTROJEJUNOSTOMY N/A 01/03/2021   Procedure: GASTROJEJUNOSTOMY;  Surgeon: [name], MD;  Location: [facility] ORS;  Service: General;  Laterality: N/A;   HAND SURGERY  SAVORY DILATION N/A 01/05/2020   Procedure: SAVORY DILATION;  Surgeon: West Bali, MD;  Location: [facility];  Service: Endoscopy;  Laterality: N/A;    Social History:  reports that he quit smoking about 34 years ago. His smoking use included cigarettes. He has never used smokeless  tobacco. He reports current drug use. He reports that he does not drink alcohol.   Allergies  Allergen Reactions   Cymbalta [Duloxetine Hcl] Other (See Comments)    Caused anorgasmia   Doxycycline     Unknown   Other Nausea And Vomiting    Mayonnaise mustard ketchup   Robaxin [Methocarbamol] Other (See Comments)    Had hiccups x 4 hours after taking    Family History  Problem Relation Age of Onset   Breast cancer Mother    Pancreatic cancer Mother    CAD Father    Colon cancer Paternal Grandfather       Prior to Admission medications   Medication Sig Start Date End Date Taking? Authorizing Provider  acetaminophen (TYLENOL) 500 MG tablet Take 1,000 mg by mouth in the morning and at bedtime.     [provider]  Blood Glucose Monitoring Suppl (TRUE METRIX METER) w/Device KIT 1 each by Does not apply route 4 (four) times daily -  before meals and at bedtime. 04/12/16   Storm Frisk, MD  cyclobenzaprine (FLEXERIL) 10 MG tablet Take 1 tablet (10 mg total) by mouth 2 (two) times daily as needed for muscle spasms. 11/07/22   Marcine Matar, MD  ferrous sulfate (FEROSUL) 325 (65 FE) MG tablet Take 1 tablet (325 mg total) by mouth 2 (two) times daily with a meal. 01/15/23   Marcine Matar, MD  fluticasone (FLONASE) 50 MCG/ACT nasal spray Place 1 spray into both nostrils daily as needed for allergies or rhinitis. 01/15/23   Marcine Matar, MD  glipiZIDE (GLUCOTROL) 5 MG tablet Take 1 tablet (5 mg total) in the morning AND 1 tablet (5 mg total) every evening. 02/20/23   Marcine Matar, MD  glucose blood test strip Use as instructed 07/28/20   Marcine Matar, MD  lisinopril (ZESTRIL) 20 MG tablet Take 1 tablet (20 mg total) by mouth in the morning and at bedtime. 02/20/23   Marcine Matar, MD  loperamide (IMODIUM A-D) 2 MG tablet Take 2 mg by mouth 4 (four) times daily as needed for diarrhea or loose stools. Patient not taking: Reported on 03/01/2023    [provider]  loratadine (CLARITIN) 10 MG tablet Take 10 mg by mouth daily.    [provider]  Magnesium Oxide 200 MG TABS Take 200 mg by mouth in the morning and at bedtime.    [provider]  naproxen sodium (ALEVE) 220 MG tablet Take 440 mg by mouth in the morning and at bedtime.    [provider]  ondansetron (ZOFRAN) 8 MG tablet Take 1 tablet (8 mg total) by mouth daily as needed for nausea or vomiting. 05/09/22   Marcine Matar, MD  pantoprazole (PROTONIX) 40 MG tablet Take 1 tablet (40 mg total) by mouth 2 (two) times daily before a meal. 07/17/22   Marcine Matar, MD  Potassium 99 MG TABS Take 99 mg by mouth daily at 6 (six) AM.    [provider]  rOPINIRole (REQUIP) 1 MG tablet Take 1 tablet (1 mg total) by mouth at bedtime. 03/04/23   Marcine Matar, MD  sildenafil (VIAGRA)  50 MG tablet Take 1 tablet (50 mg total) by mouth 1/2 hr before intercourse.  Limit use to 1 tab/24 hr 12/25/22   Marcine Matar, MD  traMADol (ULTRAM) 50 MG tablet Take 1 tablet (50 mg total) by mouth every 8 (eight) hours as needed 02/07/23   Marcine Matar, MD  vitamin B-12 (CYANOCOBALAMIN) 1000 MCG tablet Take 1,000 mcg by mouth daily.    [provider]    Physical Exam: BP (!) 137/51 (BP Location: Right Arm)   Pulse 100   Temp 99.3 F (37.4 C)   Resp 16   Ht 5\' 10"  (1.778 m)   Wt 90.6 kg   SpO2 100%   BMI 28.66 kg/m   General: 62 y.o. year-old male well developed well nourished in no acute distress.  Alert and oriented x3. Cardiovascular: Regular rate and rhythm with no rubs or gallops.  No thyromegaly or JVD noted.  No lower extremity edema. 2/4 pulses in all 4 extremities. Respiratory: Clear to auscultation with no wheezes or rales. Good inspiratory effort. Abdomen: Mildly distended with bowel sounds present Muskuloskeletal: No cyanosis, clubbing or edema noted bilaterally Neuro: CN II-XII intact, strength, sensation, reflexes Skin:  No ulcerative lesions noted or rashes Psychiatry: Judgement and insight appear normal. Mood is appropriate for condition and setting          Labs on Admission:  Basic Metabolic Panel: Recent Labs  Lab 03/10/23 1741  NA 130*  K 4.0  CL 93*  CO2 27  GLUCOSE 234*  BUN 27*  CREATININE 0.97  CALCIUM 9.4   Liver Function Tests: Recent Labs  Lab 03/10/23 1741  AST 17  ALT 20  ALKPHOS 65  BILITOT 0.2*  PROT 6.6  ALBUMIN 4.2   Recent Labs  Lab 03/10/23 1741  LIPASE 46   No results for input(s): "AMMONIA" in the last 168 hours. CBC: Recent Labs  Lab 03/10/23 1741  WBC 10.8*  HGB 8.2*  HCT 26.2*  MCV 90.0  PLT 319   Cardiac Enzymes: No results for input(s): "CKTOTAL", "CKMB", "CKMBINDEX", "TROPONINI" in the last 168 hours.  BNP (last 3 results) No results for input(s): "BNP" in the last 8760 hours.  ProBNP (last 3 results) No results for input(s): "PROBNP" in the last 8760 hours.  CBG: Recent Labs  Lab 03/10/23 2334  GLUCAP 208*    Radiological Exams on Admission: No results found.  EKG: I independently viewed the EKG done and my findings are as followed: Sinus rhythm rate of 95.  Nonspecific ST-T changes.  QTc 410.  Assessment/Plan Present on Admission:  Upper GI bleeding  Principal Problem:   Upper GI bleeding  Upper GI bleed Reported ground coffee emesis x 4 in the last 24 hours History of Barrett's esophagus, followed by GI outpatient, last EGD was seen 2023. N.p.o. until seen by GI. IV fluid hydration Transfuse hemoglobin less than 8.0. Maintain MAP greater than 65  Acute blood loss anemia in the setting of above Baseline hemoglobin appears to be 11 Presented with hemoglobin of 8.2 Serial H&H Transfuse hemoglobin less than 8.0.  Severe iron deficiency anemia Restless leg syndrome Iron studies revealed severe iron deficiency with ferritin of 5 IV Feraheme 510 mg x 1 ordered to be infused Resume home ropinirole  History of  Roux-en-Y gastric bypass surgery in 2022 Resume home regimen, on several vitamin and electrolyte supplements  Type 2 diabetes with hyperglycemia Presented with serum glucose 234 N.p.o. until seen by GI Obtain hemoglobin A1c IV  insulin sliding scale every 4 hours while NPO.  Mild hypovolemic hyponatremia Serum sodium 130 Start gentle IV fluid hydration NS at 50 cc/h x 2 days  OSA, compliant with CPAP Resume home CPAP  History of chronic duodenal stricture Defer management to GI  Chronic shoulder pain Takes naproxen twice daily Advised to avoid NSAIDs Lidocaine patch ordered     DVT prophylaxis: SCDs  Code Status: Full code  Family Communication: None at bedside  Disposition Plan: Admitted to progressive care unit  Consults called: GI  Admission status: Inpatient status.   Status is: Inpatient The patient requires at least 2 midnights for further evaluation and treatment of present condition.   Darlin Drop MD [facility] Pager [phone]  If 7PM-7AM, please contact night-coverage [email or web address] Password [facility]  03/11/2023, 12:04 AM

## 2023-03-11 NOTE — Transfer of Care (Signed)
Immediate Anesthesia Transfer of Care Note  Patient: ELPHEGE PORRAS  Procedure(s) Performed: ESOPHAGOGASTRODUODENOSCOPY (EGD) WITH PROPOFOL  Patient Location: Endoscopy Unit  Anesthesia Type:MAC  Level of Consciousness: drowsy and patient cooperative  Airway & Oxygen Therapy: Patient Spontanous Breathing and Patient connected to nasal cannula oxygen  Post-op Assessment: Report given to RN and Post -op Vital signs reviewed and stable  Post vital signs: Reviewed and stable  Last Vitals:  Vitals Value Taken Time  BP    Temp    Pulse 83 03/11/23 1522  Resp 22 03/11/23 1522  SpO2 99 % 03/11/23 1522  Vitals shown include unvalidated device data.  Last Pain:  Vitals:   03/11/23 1458  TempSrc: Temporal  PainSc:          Complications: No notable events documented.

## 2023-03-11 NOTE — Consult Note (Signed)
   Consultation  Referring Provider: Dr. Ghimire    Primary Care Physician:  Johnson, Deborah B, MD Primary Gastroenterologist: [facility]- Unassigned here    Reason for Consultation:  Coffee ground emesis            HPI:   Jeremy [name] is a 62 y.o. male with a past medical history of GERD, Barrett's esophagus, chronic duodenal stricture, Roux-en-Y gastric bypass surgery in 2022, IDA, type 2 diabetes, OSA on CPAP, chronic shoulder pain on Naproxen twice daily who presented to the ER at [facility] on 03/10/2023 for with a complaint of vomiting dark blood 4 times in the past 24 hours.      At time of presentation patient described that he had first episode about a week ago and then it recurred again on the evening of 03/08/24 x 2 and then again in the ED x 2.       Today, the patient tells me that about a week ago he started feeling bad telling me that he became dizzy upon standing and was not able to do his daily activities and then on Thursday evening 03/08/2023 he started with vomiting what looked like "Coca-Cola syrup", this happened twice, the next morning he grew weaker and on Saturday 5/25 he tried to get out of bed and took 3 steps and felt like he was going to "fall out", he then proceeded to the ER and had 4 more episodes of this "Coca-Cola syrup" vomit.  Last time this happened was yesterday.  Tells me that he feels air bubbles moving around his abdomen and if he sits up he is able to burp and feel slightly better.  Otherwise has not had anything to eat today but is hungry.        Does describe chronic Aleve use 2 tablets twice daily since 1992, initially for back pain and then for shoulder pain, also on Tramadol and a muscle relaxer.  Apparently has discussed pain management with his PCP who disregards it.      Recently also has been having some trouble with dysphagia noting that food seem to get stuck in his throat.  Describes that he has been told to take his Pantoprazole 40 mg twice a  day when he does this he feels like food does not leave his stomach at all and he is not hungry for day and a half.  Due to this has been using this medication sparingly here and there.      Also describes constipation telling me sometimes he can have 3 bowel movements a day and then might not go for a week.  His last bowel movement was on Thursday.    Denies fever, chills, weight loss, nausea or vomiting.  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 Jenkins on 01/03/2021, also history of long segment Barrett's esophagus with last EGD in April 2023 and was not on a daily PPI presenting at that time with dysphagia; plan at that time was to proceed with EGD scheduled in a few weeks at [facility] 01/2022 EGD with long segment Barrett's status postbiopsy, gastric bypass with normal-sized pouch and intact staple line, gastritis, negative   pylorus with 5 mm ulcer, duodenum with previously noted stricture, gastric biopsy with reactive gastropathy, chronic gastritis negative for H. pylori, pyloric biopsy with reactive gastropathy and ulceration and chronic gastritis, esophageal biopsy consistent with Barrett's without dysplasia 12/22/2021 EGD with Roux-en-Y gastrojejunostomy, gastritis and large amount of food residue in the stomach 02/07/2019 colonoscopy with mildly redundant left colon and external hemorrhoids  Past Medical History:  Diagnosis Date   Diabetes mellitus without complication (HCC)    GERD (gastroesophageal reflux disease)    Hyperlipidemia    Hypertension    IDA (iron deficiency anemia)    Rash of entire body 03/2016    Sleep apnea     Past Surgical History:  Procedure Laterality Date   BACK SURGERY  1993   lumbar   BIOPSY  02/07/2019   Procedure: BIOPSY;  Surgeon: Fields, Sandi L, MD;  Location: [facility];  Service: Endoscopy;;   BIOPSY  01/30/2022   Procedure: BIOPSY;  Surgeon: Carver, Charles K, DO;  Location: [facility];  Service: Endoscopy;;   CERVICAL SPINE SURGERY  2000   COLONOSCOPY N/A 02/07/2019   Dr. Fields: External hemorrhoids next colonoscopy in 10 years   ESOPHAGOGASTRODUODENOSCOPY N/A 02/07/2019   Dr. Fields: Barrett's esophagus without dysplasia chronic inactive gastritis but no H. 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, nonbleeding duodenal diverticulum,   ESOPHAGOGASTRODUODENOSCOPY N/A 01/05/2020   Procedure: ESOPHAGOGASTRODUODENOSCOPY (EGD);  Surgeon: Fields, Sandi L, MD;  Location: [facility];  Service: Endoscopy;  Laterality: N/A;  10:30am   ESOPHAGOGASTRODUODENOSCOPY (EGD) WITH PROPOFOL N/A 12/06/2020   Procedure: ESOPHAGOGASTRODUODENOSCOPY (EGD) WITH PROPOFOL;  Surgeon: Carver, Charles K, DO;  Location: [facility];  Service: Endoscopy;  Laterality: N/A;  11:45am   ESOPHAGOGASTRODUODENOSCOPY (EGD) WITH PROPOFOL N/A 12/22/2021   long-segment Barrett's s/p biopsy, gastric bypass with normal sized puch and intact staple line. GJ anastomosis with healthy mucosa. Gastritis.   ESOPHAGOGASTRODUODENOSCOPY (EGD) WITH PROPOFOL N/A 01/30/2022   Procedure: ESOPHAGOGASTRODUODENOSCOPY (EGD) WITH PROPOFOL;  Surgeon: Carver, Charles K, DO;  Location: [facility];  Service: Endoscopy;  Laterality: N/A;  11:00am   GASTROJEJUNOSTOMY N/A 01/03/2021   Procedure: GASTROJEJUNOSTOMY;  Surgeon: Jenkins, Mark, MD;  Location: [facility];  Service: General;  Laterality: N/A;   HAND SURGERY     SAVORY DILATION N/A 01/05/2020   Procedure: SAVORY DILATION;  Surgeon: Fields, Sandi L, MD;  Location: [facility];  Service: Endoscopy;   Laterality: N/A;    Family History  Problem Relation Age of Onset   Breast cancer Mother    Pancreatic cancer Mother    CAD Father    Colon cancer Paternal Grandfather     Social History   Tobacco Use   Smoking status: Former    Types: Cigarettes    Quit date: 10/16/1988    Years since quitting: 34.4   Smokeless tobacco: Never  Vaping Use   Vaping Use: Never used  Substance Use Topics   Alcohol use: No   Drug use: Yes    Comment: rare    Prior to Admission medications   Medication Sig Start Date End Date Taking? Authorizing Provider  acetaminophen (TYLENOL) 500 MG tablet Take 1,000 mg by mouth in the morning and at bedtime.     [provider]  Blood Glucose Monitoring Suppl (TRUE METRIX METER) w/Device KIT 1 each by Does not apply route 4 (four) times daily -  before meals and at bedtime. 04/12/16   Wright, Patrick E,   MD  cyclobenzaprine (FLEXERIL) 10 MG tablet Take 1 tablet (10 mg total) by mouth 2 (two) times daily as needed for muscle spasms. 11/07/22   Johnson, Deborah B, MD  ferrous sulfate (FEROSUL) 325 (65 FE) MG tablet Take 1 tablet (325 mg total) by mouth 2 (two) times daily with a meal. 01/15/23   Johnson, Deborah B, MD  fluticasone (FLONASE) 50 MCG/ACT nasal spray Place 1 spray into both nostrils daily as needed for allergies or rhinitis. 01/15/23   Johnson, Deborah B, MD  glipiZIDE (GLUCOTROL) 5 MG tablet Take 1 tablet (5 mg total) in the morning AND 1 tablet (5 mg total) every evening. 02/20/23   Johnson, Deborah B, MD  glucose blood test strip Use as instructed 07/28/20   Johnson, Deborah B, MD  lisinopril (ZESTRIL) 20 MG tablet Take 1 tablet (20 mg total) by mouth in the morning and at bedtime. 02/20/23   Johnson, Deborah B, MD  loperamide (IMODIUM A-D) 2 MG tablet Take 2 mg by mouth 4 (four) times daily as needed for diarrhea or loose stools. Patient not taking: Reported on 03/01/2023    [provider]  loratadine (CLARITIN) 10 MG tablet Take 10 mg by  mouth daily.    [provider]  Magnesium Oxide 200 MG TABS Take 200 mg by mouth in the morning and at bedtime.    [provider]  naproxen sodium (ALEVE) 220 MG tablet Take 440 mg by mouth in the morning and at bedtime.    [provider]  ondansetron (ZOFRAN) 8 MG tablet Take 1 tablet (8 mg total) by mouth daily as needed for nausea or vomiting. 05/09/22   Johnson, Deborah B, MD  pantoprazole (PROTONIX) 40 MG tablet Take 1 tablet (40 mg total) by mouth 2 (two) times daily before a meal. 07/17/22   Johnson, Deborah B, MD  Potassium 99 MG TABS Take 99 mg by mouth daily at 6 (six) AM.    [provider]  rOPINIRole (REQUIP) 1 MG tablet Take 1 tablet (1 mg total) by mouth at bedtime. 03/04/23   Johnson, Deborah B, MD  sildenafil (VIAGRA) 50 MG tablet Take 1 tablet (50 mg total) by mouth 1/2 hr before intercourse.  Limit use to 1 tab/24 hr 12/25/22   Johnson, Deborah B, MD  traMADol (ULTRAM) 50 MG tablet Take 1 tablet (50 mg total) by mouth every 8 (eight) hours as needed 02/07/23   Johnson, Deborah B, MD  vitamin B-12 (CYANOCOBALAMIN) 1000 MCG tablet Take 1,000 mcg by mouth daily.    [provider]    Current Facility-Administered Medications  Medication Dose Route Frequency Provider Last Rate Last Admin   0.9 %  sodium chloride infusion   Intravenous Continuous Hall, Carole N, DO 50 mL/hr at 03/11/23 0106 New Bag at 03/11/23 0106   acetaminophen (TYLENOL) tablet 650 mg  650 mg Oral Q6H PRN Hall, Carole N, DO       cyanocobalamin (VITAMIN B12) tablet 1,000 mcg  1,000 mcg Oral Daily Hall, Carole N, DO       fluticasone (FLONASE) 50 MCG/ACT nasal spray 1 spray  1 spray Each Nare Daily PRN Hall, Carole N, DO       insulin aspart (novoLOG) injection 0-9 Units  0-9 Units Subcutaneous Q4H Hall, Carole N, DO   3 Units at 03/11/23 0053   lidocaine (LIDODERM) 5 % 1 patch  1 patch Transdermal Q24H Hall, Carole N, DO   1 patch at 03/11/23 0125   [START   ON 03/14/2023]  pantoprazole (PROTONIX) injection 40 mg  40 mg Intravenous Q12H Chen, Eric, DO       pantoprozole (PROTONIX) 80 mg /NS 100 mL infusion  8 mg/hr Intravenous Continuous Chen, Eric, DO 10 mL/hr at 03/11/23 0431 8 mg/hr at 03/11/23 0431   polyethylene glycol (MIRALAX / GLYCOLAX) packet 17 g  17 g Oral Daily PRN Hall, Carole N, DO       promethazine (PHENERGAN) 25 mg in sodium chloride 0.9 % 50 mL IVPB  25 mg Intravenous Q6H PRN Nanavati, Ankit, MD 200 mL/hr at 03/11/23 0817 25 mg at 03/11/23 0817   rOPINIRole (REQUIP) tablet 1 mg  1 mg Oral QHS Hall, Carole N, DO   1 mg at 03/11/23 0125   simethicone (MYLICON) chewable tablet 160 mg  160 mg Oral QID Hall, Carole N, DO   160 mg at 03/11/23 0803   sucralfate (CARAFATE) 1 GM/10ML suspension 2 g  2 g Oral TID WC & HS Nanavati, Ankit, MD       traMADol (ULTRAM) tablet 50 mg  50 mg Oral Q6H PRN Ghimire, Kuber, MD        Allergies as of 03/10/2023 - Review Complete 03/10/2023  Allergen Reaction Noted   Cymbalta [duloxetine hcl] Other (See Comments) 06/05/2017   Doxycycline  03/02/2021   Other Nausea And Vomiting 01/24/2019   Robaxin [methocarbamol] Other (See Comments) 06/02/2019     Review of Systems:    Constitutional: No weight loss, fever or chills Skin: No rash  Cardiovascular: No chest pain  Respiratory: No SOB  Gastrointestinal: See HPI and otherwise negative Genitourinary: No dysuria  Neurological: No headache, dizziness or syncope Musculoskeletal: No new muscle or joint pain Hematologic: No bleeding  Psychiatric: No history of depression or anxiety    Physical Exam:  Vital signs in last 24 hours: Temp:  [98.2 F (36.8 C)-99.3 F (37.4 C)] 98.7 F (37.1 C) (05/26 0739) Pulse Rate:  [75-104] 75 (05/26 0739) Resp:  [16-26] 24 (05/26 0739) BP: (105-149)/(32-68) 122/32 (05/26 0739) SpO2:  [97 %-100 %] 97 % (05/26 0739) Weight:  [90.6 kg] 90.6 kg (05/25 2330)   General:   Pleasant Caucasian male appears to be in NAD, Well  developed, Well nourished, alert and cooperative Head:  Normocephalic and atraumatic. Eyes:   PEERL, EOMI. No icterus. Conjunctiva pink. Ears:  Normal auditory acuity. Neck:  Supple Throat: Oral cavity and pharynx without inflammation, swelling or lesion. Poor dentition Lungs: Respirations even and unlabored. Lungs clear to auscultation bilaterally.   No wheezes, crackles, or rhonchi.  Heart: Normal S1, S2. No MRG. Regular rate and rhythm. No peripheral edema, cyanosis or pallor.  Abdomen:  Soft, nondistended, mild epigastric ttp No rebound or guarding. Normal bowel sounds. No appreciable masses or hepatomegaly. Rectal:  Not performed.  Msk:  Symmetrical without gross deformities. Peripheral pulses intact.  Extremities:  Without edema, no deformity or joint abnormality.  Neurologic:  Alert and  oriented x4;  grossly normal neurologically.  Skin:   Dry and intact without significant lesions or rashes. Psychiatric: Demonstrates good judgement and reason without abnormal affect or behaviors.   LAB RESULTS: Recent Labs    03/10/23 1741 03/11/23 0058 03/11/23 0543  WBC 10.8*  --  8.1  HGB 8.2* 7.3* 6.6*  HCT 26.2* 24.1* 21.3*  PLT 319  --  237   BMET Recent Labs    03/10/23 1741 03/11/23 0543  NA 130* 126*  K 4.0 4.3  CL 93* 99  CO2 27   20*  GLUCOSE 234* 124*  BUN 27* 21*  CREATININE 0.97 0.91  CALCIUM 9.4 8.0*   LFT Recent Labs    03/11/23 0543  PROT 5.6*  ALBUMIN 3.0*  AST 18  ALT 23  ALKPHOS 51  BILITOT 0.7   PT/INR Recent Labs    03/11/23 0543  LABPROT 15.4*  INR 1.2    Impression / Plan:   Impression: 1.  Upper GI bleed: Report of coffee-ground emesis x 4 over the past 24 hours, history of Barrett's and previous Roux-en-Y gastric bypass as well as Naproxen use, hemoglobin 8.2--> 7.3--> 6.6--> 2 more units prbcs ordered; consider most likely NSAID related ulcer versus other 2.  Acute blood loss anemia/ Iron deficiency anemia: Baseline hemoglobin around  11; roux-en-y bypass likely contributory 3.  Restless leg syndrome 4.  History of Roux-en-Y gastric bypass in 2022 5.  OSA on CPAP 6.  History of chronic duodenal stricture 7.  Chronic shoulder pain: On Naproxen twice daily 8.  Constipation 9.  Dysphagia  Plan: 1.  Agree with 2 more units PRBCs, hemoglobin should be above 7 or at least be close to this at time of EGD scheduled for 2:00 this afternoon.  Did discuss risks, benefits, limitations and alternatives with the patient and he agrees to proceed. 2.  Patient to remain n.p.o. until after time of procedure today 3.  Continue to monitor hemoglobin with transfusion as needed less than 7 4.  Agree with Pantoprazole infusion, pending results from EGD this could be changed to IV 5.  Had a long discussion with the patient about his NSAID use.  He likely would benefit from a pain management clinic in the future given that he feels the need to be on these medicines with his chronic pain and it is obvious that NSAIDs are making his GI symptoms worse.  Especially with his specific history. 6.  Patient is already scheduled for EGD in about a month with his primary GI team, this likely would not be bad timing for follow-up after today's EGD, but this can be addressed after procedure.  Thank you for your kind consultation, we will continue to follow.  Abhiraj Dozal Lynne Boubacar Lerette  03/11/2023, 8:26 AM    

## 2023-03-11 NOTE — H&P (View-Only) (Signed)
Consultation  Referring Provider: Dr. Jerral Ralph    Primary Care Physician:  Marcine Matar, MD Primary Gastroenterologist: Gwinda Maine- Unassigned here    Reason for Consultation:  Coffee ground emesis            HPI:   Jeremy [name] is a 62 y.o. male with a past medical history of GERD, Barrett's esophagus, chronic duodenal stricture, Roux-en-Y gastric bypass surgery in 2022, IDA, type 2 diabetes, OSA on CPAP, chronic shoulder pain on Naproxen twice daily who presented to the ER at [facility] on 03/10/2023 for with a complaint of vomiting dark blood 4 times in the past 24 hours.      At time of presentation patient described that he had first episode about a week ago and then it recurred again on the evening of 03/08/24 x 2 and then again in the ED x 2.       Today, the patient tells me that about a week ago he started feeling bad telling me that he became dizzy upon standing and was not able to do his daily activities and then on Thursday evening 03/08/2023 he started with vomiting what looked like "Coca-Cola syrup", this happened twice, the next morning he grew weaker and on Saturday 5/25 he tried to get out of bed and took 3 steps and felt like he was going to "fall out", he then proceeded to the ER and had 4 more episodes of this "Coca-Cola syrup" vomit.  Last time this happened was yesterday.  Tells me that he feels air bubbles moving around his abdomen and if he sits up he is able to burp and feel slightly better.  Otherwise has not had anything to eat today but is hungry.        Does describe chronic Aleve use 2 tablets twice daily since 1992, initially for back pain and then for shoulder pain, also on Tramadol and a muscle relaxer.  Apparently has discussed pain management with his PCP who disregards it.      Recently also has been having some trouble with dysphagia noting that food seem to get stuck in his throat.  Describes that he has been told to take his Pantoprazole 40 mg twice a  day when he does this he feels like food does not leave his stomach at all and he is not hungry for day and a half.  Due to this has been using this medication sparingly here and there.      Also describes constipation telling me sometimes he can have 3 bowel movements a day and then might not go for a week.  His last bowel movement was on Thursday.    Denies fever, chills, weight loss, nausea or vomiting.  ED course: Hemoglobin 8 (from 11 about 3 months ago), platelets normal, WBC normal, ferritin 5, iron level 27  GI history: 03/01/2023 office visit with [facility] gastroenterology: At that time discussed his history of partial gastric outlet obstruction secondary to pyloric and duodenal stenosis, failing conservative management with PPI therapy and dilation with IDA, subsequently underwent a Roux-en-Y gastric bypass by Dr. Lovell Sheehan on 01/03/2021, also history of long segment Barrett's esophagus with last EGD in April 2023 and was not on a daily PPI presenting at that time with dysphagia; plan at that time was to proceed with EGD scheduled in a few weeks at [facility] 01/2022 EGD with long segment Barrett's status postbiopsy, gastric bypass with normal-sized pouch and intact staple line, gastritis, negative  pylorus with 5 mm ulcer, duodenum with previously noted stricture, gastric biopsy with reactive gastropathy, chronic gastritis negative for H. pylori, pyloric biopsy with reactive gastropathy and ulceration and chronic gastritis, esophageal biopsy consistent with Barrett's without dysplasia 12/22/2021 EGD with Roux-en-Y gastrojejunostomy, gastritis and large amount of food residue in the stomach 02/07/2019 colonoscopy with mildly redundant left colon and external hemorrhoids  Past Medical History:  Diagnosis Date   Diabetes mellitus without complication (HCC)    GERD (gastroesophageal reflux disease)    Hyperlipidemia    Hypertension    IDA (iron deficiency anemia)    Rash of entire body 03/2016    Sleep apnea     Past Surgical History:  Procedure Laterality Date   BACK SURGERY  1993   lumbar   BIOPSY  02/07/2019   Procedure: BIOPSY;  Surgeon: West Bali, MD;  [facility];  Service: Endoscopy;;   BIOPSY  01/30/2022   Procedure: BIOPSY;  Surgeon: Lanelle Bal, DO;  [facility];  Service: Endoscopy;;   CERVICAL SPINE SURGERY  2000   COLONOSCOPY N/A 02/07/2019   Dr. Darrick Penna: External hemorrhoids next colonoscopy in 10 years   ESOPHAGOGASTRODUODENOSCOPY N/A 02/07/2019   Dr. Darrick Penna: Barrett's esophagus without dysplasia chronic inactive gastritis but no H. 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, nonbleeding duodenal diverticulum,   ESOPHAGOGASTRODUODENOSCOPY N/A 01/05/2020   Procedure: ESOPHAGOGASTRODUODENOSCOPY (EGD);  Surgeon: West Bali, MD;  [facility];  Service: Endoscopy;  Laterality: N/A;  10:30am   ESOPHAGOGASTRODUODENOSCOPY (EGD) WITH PROPOFOL N/A 12/06/2020   Procedure: ESOPHAGOGASTRODUODENOSCOPY (EGD) WITH PROPOFOL;  Surgeon: Lanelle Bal, DO;  [facility];  Service: Endoscopy;  Laterality: N/A;  11:45am   ESOPHAGOGASTRODUODENOSCOPY (EGD) WITH PROPOFOL N/A 12/22/2021   long-segment Barrett's s/p biopsy, gastric bypass with normal sized puch and intact staple line. GJ anastomosis with healthy mucosa. Gastritis.   ESOPHAGOGASTRODUODENOSCOPY (EGD) WITH PROPOFOL N/A 01/30/2022   Procedure: ESOPHAGOGASTRODUODENOSCOPY (EGD) WITH PROPOFOL;  Surgeon: Lanelle Bal, DO;  [facility];  Service: Endoscopy;  Laterality: N/A;  11:00am   GASTROJEJUNOSTOMY N/A 01/03/2021   Procedure: GASTROJEJUNOSTOMY;  Surgeon: [name], MD;  Location: [facility];  Service: General;  Laterality: N/A;   HAND SURGERY     SAVORY DILATION N/A 01/05/2020   Procedure: SAVORY DILATION;  Surgeon: West Bali, MD;  Location: [facility];  Service: Endoscopy;   Laterality: N/A;    Family History  Problem Relation Age of Onset   Breast cancer Mother    Pancreatic cancer Mother    CAD Father    Colon cancer Paternal Grandfather     Social History   Tobacco Use   Smoking status: Former    Types: Cigarettes    Quit date: 10/16/1988    Years since quitting: 34.4   Smokeless tobacco: Never  Vaping Use   Vaping Use: Never used  Substance Use Topics   Alcohol use: No   Drug use: Yes    Comment: rare    Prior to Admission medications   Medication Sig Start Date End Date Taking? Authorizing Provider  acetaminophen (TYLENOL) 500 MG tablet Take 1,000 mg by mouth in the morning and at bedtime.     [provider]  Blood Glucose Monitoring Suppl (TRUE METRIX METER) w/Device KIT 1 each by Does not apply route 4 (four) times daily -  before meals and at bedtime. 04/12/16   Storm Frisk,  MD  cyclobenzaprine (FLEXERIL) 10 MG tablet Take 1 tablet (10 mg total) by mouth 2 (two) times daily as needed for muscle spasms. 11/07/22   Marcine Matar, MD  ferrous sulfate (FEROSUL) 325 (65 FE) MG tablet Take 1 tablet (325 mg total) by mouth 2 (two) times daily with a meal. 01/15/23   Marcine Matar, MD  fluticasone (FLONASE) 50 MCG/ACT nasal spray Place 1 spray into both nostrils daily as needed for allergies or rhinitis. 01/15/23   Marcine Matar, MD  glipiZIDE (GLUCOTROL) 5 MG tablet Take 1 tablet (5 mg total) in the morning AND 1 tablet (5 mg total) every evening. 02/20/23   Marcine Matar, MD  glucose blood test strip Use as instructed 07/28/20   Marcine Matar, MD  lisinopril (ZESTRIL) 20 MG tablet Take 1 tablet (20 mg total) by mouth in the morning and at bedtime. 02/20/23   Marcine Matar, MD  loperamide (IMODIUM A-D) 2 MG tablet Take 2 mg by mouth 4 (four) times daily as needed for diarrhea or loose stools. Patient not taking: Reported on 03/01/2023    [provider]  loratadine (CLARITIN) 10 MG tablet Take 10 mg by  mouth daily.    [provider]  Magnesium Oxide 200 MG TABS Take 200 mg by mouth in the morning and at bedtime.    [provider]  naproxen sodium (ALEVE) 220 MG tablet Take 440 mg by mouth in the morning and at bedtime.    [provider]  ondansetron (ZOFRAN) 8 MG tablet Take 1 tablet (8 mg total) by mouth daily as needed for nausea or vomiting. 05/09/22   Marcine Matar, MD  pantoprazole (PROTONIX) 40 MG tablet Take 1 tablet (40 mg total) by mouth 2 (two) times daily before a meal. 07/17/22   Marcine Matar, MD  Potassium 99 MG TABS Take 99 mg by mouth daily at 6 (six) AM.    [provider]  rOPINIRole (REQUIP) 1 MG tablet Take 1 tablet (1 mg total) by mouth at bedtime. 03/04/23   Marcine Matar, MD  sildenafil (VIAGRA) 50 MG tablet Take 1 tablet (50 mg total) by mouth 1/2 hr before intercourse.  Limit use to 1 tab/24 hr 12/25/22   Marcine Matar, MD  traMADol (ULTRAM) 50 MG tablet Take 1 tablet (50 mg total) by mouth every 8 (eight) hours as needed 02/07/23   Marcine Matar, MD  vitamin B-12 (CYANOCOBALAMIN) 1000 MCG tablet Take 1,000 mcg by mouth daily.    [provider]    Current Facility-Administered Medications  Medication Dose Route Frequency Provider Last Rate Last Admin   0.9 %  sodium chloride infusion   Intravenous Continuous Darlin Drop, DO 50 mL/hr at 03/11/23 0106 New Bag at 03/11/23 0106   acetaminophen (TYLENOL) tablet 650 mg  650 mg Oral Q6H PRN Dow Adolph N, DO       cyanocobalamin (VITAMIN B12) tablet 1,000 mcg  1,000 mcg Oral Daily Hall, Carole N, DO       fluticasone (FLONASE) 50 MCG/ACT nasal spray 1 spray  1 spray Each Nare Daily PRN Dow Adolph N, DO       insulin aspart (novoLOG) injection 0-9 Units  0-9 Units Subcutaneous Q4H Dow Adolph N, DO   3 Units at 03/11/23 0053   lidocaine (LIDODERM) 5 % 1 patch  1 patch Transdermal Q24H Dow Adolph N, DO   1 patch at 03/11/23 0125   [START  ON 03/14/2023]  pantoprazole (PROTONIX) injection 40 mg  40 mg Intravenous Q12H Carollee Herter, DO       pantoprozole (PROTONIX) 80 mg /NS 100 mL infusion  8 mg/hr Intravenous Continuous Carollee Herter, DO 10 mL/hr at 03/11/23 0431 8 mg/hr at 03/11/23 0431   polyethylene glycol (MIRALAX / GLYCOLAX) packet 17 g  17 g Oral Daily PRN Darlin Drop, DO       promethazine (PHENERGAN) 25 mg in sodium chloride 0.9 % 50 mL IVPB  25 mg Intravenous Q6H PRN Derwood Kaplan, MD 200 mL/hr at 03/11/23 0817 25 mg at 03/11/23 0817   rOPINIRole (REQUIP) tablet 1 mg  1 mg Oral QHS Dow Adolph N, DO   1 mg at 03/11/23 0125   simethicone (MYLICON) chewable tablet 160 mg  160 mg Oral QID Dow Adolph N, DO   160 mg at 03/11/23 0803   sucralfate (CARAFATE) 1 GM/10ML suspension 2 g  2 g Oral TID WC & HS Derwood Kaplan, MD       traMADol (ULTRAM) tablet 50 mg  50 mg Oral Q6H PRN Dorcas Carrow, MD        Allergies as of 03/10/2023 - Review Complete 03/10/2023  Allergen Reaction Noted   Cymbalta [duloxetine hcl] Other (See Comments) 06/05/2017   Doxycycline  03/02/2021   Other Nausea And Vomiting 01/24/2019   Robaxin [methocarbamol] Other (See Comments) 06/02/2019     Review of Systems:    Constitutional: No weight loss, fever or chills Skin: No rash  Cardiovascular: No chest pain  Respiratory: No SOB  Gastrointestinal: See HPI and otherwise negative Genitourinary: No dysuria  Neurological: No headache, dizziness or syncope Musculoskeletal: No new muscle or joint pain Hematologic: No bleeding  Psychiatric: No history of depression or anxiety    Physical Exam:  Vital signs in last 24 hours: Temp:  [98.2 F (36.8 C)-99.3 F (37.4 C)] 98.7 F (37.1 C) (05/26 0739) Pulse Rate:  [75-104] 75 (05/26 0739) Resp:  [16-26] 24 (05/26 0739) BP: (105-149)/(32-68) 122/32 (05/26 0739) SpO2:  [97 %-100 %] 97 % (05/26 0739) Weight:  [90.6 kg] 90.6 kg (05/25 2330)   General:   Pleasant Caucasian male appears to be in NAD, Well  developed, Well nourished, alert and cooperative Head:  Normocephalic and atraumatic. Eyes:   PEERL, EOMI. No icterus. Conjunctiva pink. Ears:  Normal auditory acuity. Neck:  Supple Throat: Oral cavity and pharynx without inflammation, swelling or lesion. Poor dentition Lungs: Respirations even and unlabored. Lungs clear to auscultation bilaterally.   No wheezes, crackles, or rhonchi.  Heart: Normal S1, S2. No MRG. Regular rate and rhythm. No peripheral edema, cyanosis or pallor.  Abdomen:  Soft, nondistended, mild epigastric ttp No rebound or guarding. Normal bowel sounds. No appreciable masses or hepatomegaly. Rectal:  Not performed.  Msk:  Symmetrical without gross deformities. Peripheral pulses intact.  Extremities:  Without edema, no deformity or joint abnormality.  Neurologic:  Alert and  oriented x4;  grossly normal neurologically.  Skin:   Dry and intact without significant lesions or rashes. Psychiatric: Demonstrates good judgement and reason without abnormal affect or behaviors.   LAB RESULTS: Recent Labs    03/10/23 1741 03/11/23 0058 03/11/23 0543  WBC 10.8*  --  8.1  HGB 8.2* 7.3* 6.6*  HCT 26.2* 24.1* 21.3*  PLT 319  --  237   BMET Recent Labs    03/10/23 1741 03/11/23 0543  NA 130* 126*  K 4.0 4.3  CL 93* 99  CO2 27  20*  GLUCOSE 234* 124*  BUN 27* 21*  CREATININE 0.97 0.91  CALCIUM 9.4 8.0*   LFT Recent Labs    03/11/23 0543  PROT 5.6*  ALBUMIN 3.0*  AST 18  ALT 23  ALKPHOS 51  BILITOT 0.7   PT/INR Recent Labs    03/11/23 0543  LABPROT 15.4*  INR 1.2    Impression / Plan:   Impression: 1.  Upper GI bleed: Report of coffee-ground emesis x 4 over the past 24 hours, history of Barrett's and previous Roux-en-Y gastric bypass as well as Naproxen use, hemoglobin 8.2--> 7.3--> 6.6--> 2 more units prbcs ordered; consider most likely NSAID related ulcer versus other 2.  Acute blood loss anemia/ Iron deficiency anemia: Baseline hemoglobin around  11; roux-en-y bypass likely contributory 3.  Restless leg syndrome 4.  History of Roux-en-Y gastric bypass in 2022 5.  OSA on CPAP 6.  History of chronic duodenal stricture 7.  Chronic shoulder pain: On Naproxen twice daily 8.  Constipation 9.  Dysphagia  Plan: 1.  Agree with 2 more units PRBCs, hemoglobin should be above 7 or at least be close to this at time of EGD scheduled for 2:00 this afternoon.  Did discuss risks, benefits, limitations and alternatives with the patient and he agrees to proceed. 2.  Patient to remain n.p.o. until after time of procedure today 3.  Continue to monitor hemoglobin with transfusion as needed less than 7 4.  Agree with Pantoprazole infusion, pending results from EGD this could be changed to IV 5.  Had a long discussion with the patient about his NSAID use.  He likely would benefit from a pain management clinic in the future given that he feels the need to be on these medicines with his chronic pain and it is obvious that NSAIDs are making his GI symptoms worse.  Especially with his specific history. 6.  Patient is already scheduled for EGD in about a month with his primary GI team, this likely would not be bad timing for follow-up after today's EGD, but this can be addressed after procedure.  Thank you for your kind consultation, we will continue to follow.  Violet Baldy [facility]  03/11/2023, 8:26 AM

## 2023-03-11 NOTE — Progress Notes (Signed)
Patient refused CPAP for the night  

## 2023-03-11 NOTE — Progress Notes (Signed)
Patient stated he does wear a CPAP. He was vomiting blood earlier in the evening and will wear CPAP tomorrow evening.

## 2023-03-11 NOTE — Op Note (Signed)
[facility] Patient Name: Jeremy [name] Procedure Date : 03/11/2023 MRN: [id number] Attending MD: Willaim Rayas. Adela Lank , MD, [id number] Date of Birth: April 07, 1961 CSN: [id number] Age: 62 Admit Type: Inpatient Procedure:                Upper GI endoscopy Indications:              Coffee-ground emesis / upper GI bleed - reported                            history of a small bowel stenosis at site of prior                            surgery. Routine use of Aleve Providers:                Viviann Spare P. Adela Lank, MD, Fransisca Connors, Beryle Beams, Technician, Dairl Ponder, CRNA Referring MD:              Medicines:                Monitored Anesthesia Care Complications:            No immediate complications. Estimated blood loss:                            Minimal. Estimated Blood Loss:     Estimated blood loss was minimal. Procedure:                Pre-Anesthesia Assessment:                           - Prior to the procedure, a History and Physical                            was performed, and patient medications and                            allergies were reviewed. The patient's tolerance of                            previous anesthesia was also reviewed. The risks                            and benefits of the procedure and the sedation                            options and risks were discussed with the patient.                            All questions were answered, and informed consent                            was obtained. Prior Anticoagulants: The patient has  taken no anticoagulant or antiplatelet agents. ASA                            Grade Assessment: III - A patient with severe                            systemic disease. After reviewing the risks and                            benefits, the patient was deemed in satisfactory                            condition to undergo the procedure.                            After obtaining informed consent, the endoscope was                            passed under direct vision. Throughout the                            procedure, the patient's blood pressure, pulse, and                            oxygen saturations were monitored continuously. The                            [id number]-[id number] ([id number]) Olympus endoscope was introduced                            through the mouth, and advanced to the jejunum. The                            upper GI endoscopy was accomplished without                            difficulty. The patient tolerated the procedure                            well. Scope In: Scope Out: Findings:      Esophagitis with no bleeding was found in the mid to distal esophagus..      Barrett's esophagus was present in the middle third of the esophagus and       in the lower third of the esophagus. Long segment - time was not taken       to measure / biopsy this closely given food in the stomach.      The exam of the esophagus was otherwise normal.      A large amount of food residue was found in the proximal gastric body       which could not be cleared. He has had this noted on exams in the past.       This exam was quite short once this was appreciated.      Evidence of a previous surgical anastomosis was found in the gastric  antrum - gastrojejunostomy (he does not have a reported traditional       Roux-en-y as reported. This was characterized by a large clean based       ulcer without stigmata for bleeding.      The exam of the stomach was otherwise normal of what was visualized. No       heme noted anywhere.      An acquired benign-appearing, intrinsic severe stenosis was found in at       the anastomosis to the small bowel just near the ulcer associated with       edema, and was non-traversed with the upper endoscope. Dilation not       performed given active ulceration in close proximity and retained food       in the stomach. This  stenosis has been noted on exams in the past and       treated with balloon dilation remotely Impression:               - Reflux esophagitis with no bleeding.                           - Long segment of Barrett's esophagus.                           - A large amount of food (residue) in the stomach                            which could not be cleared, making for a short                            exam, could not clear entire stomach                           - A previous surgical anastomosis was found,                            characterized by large ulceration. This is the                            cause of the patient's bleeding - no high risk                            stigmata for bleeding noted..                           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 Should be able to tolerate liquids okay. Marland Kitchen                           -  Recommendation:           - Return patient to hospital ward for ongoing care.                           - Clear liquid diet. May need to be on a liquid                            diet for some time while the ulcer / edema heal                           - Continue present medications.                           - Continue protonix 40mg  BID                           - Liquid carafate 10cc po every 6 hours                           - He will need dilation of stricture at some point                            in time but need healing of ulcer near the                            stricture and improvement of edema from the                            inflammation prior to attempting this                           - Sleep with head of bed elevated                           - Monitor Hgb and for recurrent bleeding                           - AVOID ALL NSAIDS                            - Will reassess him tomorrow. Call with questions                            in the interim Procedure Code(s):        --- Professional ---                           [phone], Esophagogastroduodenoscopy, flexible,                            transoral; diagnostic, including collection of                            specimen(s) by brushing or washing, when performed                            (  separate procedure) Diagnosis Code(s):        --- Professional ---                           K21.00, Gastro-esophageal reflux disease with                            esophagitis, without bleeding                           K22.70, Barrett's esophagus without dysplasia                           Z98.0, Intestinal bypass and anastomosis status                           K31.5, Obstruction of duodenum                           K92.0, Hematemesis CPT copyright 2022 [employer]. All rights reserved. The codes documented in this report are preliminary and upon coder review may  be revised to meet current compliance requirements. Viviann Spare P. Johaan Ryser, MD 03/11/2023 3:37:01 PM This report has been signed electronically. Number of Addenda: 0

## 2023-03-11 NOTE — Progress Notes (Signed)
Pt c/o L FA hurting b/c of an IV with NS @ 10 cc/hr, without any visible signs for pain cause.  Pt also has L AC which he states it not the cause of his pain.  RN attempted to start new IV which he refused, then refused to get any blood (2 units PRBC's ordered).   MD arrived and spoke to pt, now is agreeable to both.    IV team order placed.

## 2023-03-11 NOTE — Interval H&P Note (Signed)
History and Physical Interval Note: EGD to further evaluate bleeding. HAs another PRBC unit going right now, otherwise no bleeding symptoms since I have seen him. I have discussed risks / benefits of the procedure and anesthesia and he wishes to proceed. All questions answered.   03/11/2023 2:48 PM  Jeremy [name]  has presented today for surgery, with the diagnosis of Anemia; Coffee Ground Emesis.  The various methods of treatment have been discussed with the patient and family. After consideration of risks, benefits and other options for treatment, the patient has consented to  Procedure(s): ESOPHAGOGASTRODUODENOSCOPY (EGD) WITH PROPOFOL (N/A) as a surgical intervention.  The patient's history has been reviewed, patient examined, no change in status, stable for surgery.  I have reviewed the patient's chart and labs.  Questions were answered to the patient's satisfaction.     [name]

## 2023-03-11 NOTE — Anesthesia Postprocedure Evaluation (Signed)
Anesthesia Post Note  Patient: Jeremy [name]  Procedure(s) Performed: ESOPHAGOGASTRODUODENOSCOPY (EGD) WITH PROPOFOL     Patient location during evaluation: Endoscopy Anesthesia Type: MAC Level of consciousness: awake Pain management: pain level controlled Vital Signs Assessment: post-procedure vital signs reviewed and stable Respiratory status: spontaneous breathing, nonlabored ventilation and respiratory function stable Cardiovascular status: blood pressure returned to baseline and stable Postop Assessment: no apparent nausea or vomiting Anesthetic complications: no   No notable events documented.  Last Vitals:  Vitals:   03/11/23 1740 03/11/23 1926  BP: (!) 125/59 132/65  Pulse: 71 80  Resp: 17 18  Temp: 36.8 C 36.7 C  SpO2: 100% 100%    Last Pain:  Vitals:   03/11/23 1926  TempSrc: Oral  PainSc: 0-No pain                 Shuna Tabor P Tyson Masin

## 2023-03-12 DIAGNOSIS — Z9884 Bariatric surgery status: Secondary | ICD-10-CM | POA: Diagnosis not present

## 2023-03-12 DIAGNOSIS — K920 Hematemesis: Secondary | ICD-10-CM

## 2023-03-12 DIAGNOSIS — Z8719 Personal history of other diseases of the digestive system: Secondary | ICD-10-CM

## 2023-03-12 DIAGNOSIS — K922 Gastrointestinal hemorrhage, unspecified: Secondary | ICD-10-CM

## 2023-03-12 DIAGNOSIS — D62 Acute posthemorrhagic anemia: Secondary | ICD-10-CM

## 2023-03-12 DIAGNOSIS — K315 Obstruction of duodenum: Secondary | ICD-10-CM

## 2023-03-12 DIAGNOSIS — K9189 Other postprocedural complications and disorders of digestive system: Secondary | ICD-10-CM

## 2023-03-12 DIAGNOSIS — K289 Gastrojejunal ulcer, unspecified as acute or chronic, without hemorrhage or perforation: Secondary | ICD-10-CM

## 2023-03-12 LAB — BASIC METABOLIC PANEL
Anion gap: 6 (ref 5–15)
BUN: 11 mg/dL (ref 6–20)
CO2: 23 mmol/L (ref 22–32)
Calcium: 8.5 mg/dL — ABNORMAL LOW (ref 8.9–10.3)
Chloride: 103 mmol/L (ref 98–111)
Creatinine, Ser: 0.95 mg/dL (ref 0.61–1.24)
GFR, Estimated: >60 mL/min (ref >60)
Glucose, Bld: 103 mg/dL — ABNORMAL HIGH (ref 70–99)
Potassium: 3.9 mmol/L (ref 3.5–5.1)
Sodium: 132 mmol/L — ABNORMAL LOW (ref 135–145)

## 2023-03-12 LAB — TYPE AND SCREEN
ABO/RH(D): A POS
Unit division: 0
Unit division: 0

## 2023-03-12 LAB — CBC WITH DIFFERENTIAL/PLATELET
Abs Immature Granulocytes: 0.03 10*3/uL (ref 0.00–0.07)
Basophils Absolute: 0.1 10*3/uL (ref 0.0–0.1)
Basophils Relative: 1 %
Eosinophils Absolute: 0.2 10*3/uL (ref 0.0–0.5)
Eosinophils Relative: 4 %
HCT: 27.1 % — ABNORMAL LOW (ref 39.0–52.0)
Hemoglobin: 8.6 g/dL — ABNORMAL LOW (ref 13.0–17.0)
Immature Granulocytes: 1 %
Lymphocytes Relative: 21 %
Lymphs Abs: 1.1 10*3/uL (ref 0.7–4.0)
MCH: 28.7 pg (ref 26.0–34.0)
MCHC: 31.7 g/dL (ref 30.0–36.0)
MCV: 90.3 fL (ref 80.0–100.0)
Monocytes Absolute: 0.6 10*3/uL (ref 0.1–1.0)
Monocytes Relative: 12 %
Neutro Abs: 3.2 10*3/uL (ref 1.7–7.7)
Neutrophils Relative %: 61 %
Platelets: 203 10*3/uL (ref 150–400)
RBC: 3 MIL/uL — ABNORMAL LOW (ref 4.22–5.81)
RDW: 14.5 % (ref 11.5–15.5)
WBC: 5.2 10*3/uL (ref 4.0–10.5)
nRBC: 0 % (ref 0.0–0.2)

## 2023-03-12 LAB — BPAM RBC
Blood Product Expiration Date: 202406212359
ISSUE DATE / TIME: 202405261359
Unit Type and Rh: 6200
Unit Type and Rh: 6200

## 2023-03-12 LAB — GLUCOSE, CAPILLARY
Glucose-Capillary: 112 mg/dL — ABNORMAL HIGH (ref 70–99)
Glucose-Capillary: 101 mg/dL — ABNORMAL HIGH (ref 70–99)
Glucose-Capillary: 100 mg/dL — ABNORMAL HIGH (ref 70–99)

## 2023-03-12 LAB — MAGNESIUM: Magnesium: 2.1 mg/dL (ref 1.7–2.4)

## 2023-03-12 MED ORDER — PANTOPRAZOLE SODIUM 40 MG PO TBEC
40.0000 mg | DELAYED_RELEASE_TABLET | Freq: Once | ORAL | Status: AC
  Administered 2023-03-12: 40 mg via ORAL
  Filled 2023-03-12: qty 1

## 2023-03-12 MED ORDER — LIDOCAINE 5 % EX PTCH
1.0000 | MEDICATED_PATCH | CUTANEOUS | 0 refills | Status: DC
  Filled 2023-03-12: qty 30, 30d supply, fill #0

## 2023-03-12 MED ORDER — PANTOPRAZOLE SODIUM 40 MG PO TBEC
40.0000 mg | DELAYED_RELEASE_TABLET | Freq: Two times a day (BID) | ORAL | 1 refills | Status: DC
  Filled 2023-03-12: qty 60, 30d supply, fill #0
  Filled 2023-04-16: qty 60, 30d supply, fill #1

## 2023-03-12 MED ORDER — SUCRALFATE 1 GM/10ML PO SUSP
1.0000 g | Freq: Three times a day (TID) | ORAL | 0 refills | Status: DC
  Filled 2023-03-12: qty 420, 14d supply, fill #0

## 2023-03-12 NOTE — TOC Initial Note (Signed)
Transition of Care [facility]) - Initial/Assessment Note    Patient Details  Name: Jeremy [name] MRN: [id number] Date of Birth: [birth year]  [facility]) CM/SW Contact:    Elliot Cousin, RN Phone Number: [phone] 03/12/2023, 11:56 AM  Clinical Narrative:    CM spoke to pt and states he was independent PTA. He will contact his PCP to arrange follow up appt. Family to provide transportation home. No CM needs identified.               Expected Discharge Plan: Home/Self Care Barriers to Discharge: No Barriers Identified   Patient Goals and CMS Choice Patient states their goals for this hospitalization and ongoing recovery are:: wants to remain independent          Expected Discharge Plan and Services   Discharge Planning Services: CM Consult   Living arrangements for the past 2 months: Single Family Home Expected Discharge Date: 03/12/23                     Prior Living Arrangements/Services Living arrangements for the past 2 months: Single Family Home Lives with:: Significant Other Patient language and need for interpreter reviewed:: Yes Do you feel safe going back to the place where you live?: Yes      Need for Family Participation in Patient Care: No (Comment) Care giver support system in place?: Yes (comment)   Criminal Activity/Legal Involvement Pertinent to Current Situation/Hospitalization: No - Comment as needed  Activities of Daily Living Home Assistive Devices/Equipment: CPAP ADL Screening (condition at time of admission) Patient's cognitive ability adequate to safely complete daily activities?: Yes Is the patient deaf or have difficulty hearing?: No Does the patient have difficulty seeing, even when wearing glasses/contacts?: No Does the patient have difficulty concentrating, remembering, or making decisions?: No Patient able to express need for assistance with ADLs?: Yes Does the patient have difficulty dressing or bathing?:  No Independently performs ADLs?: Yes (appropriate for developmental age) Does the patient have difficulty walking or climbing stairs?: No Weakness of Legs: None Weakness of Arms/Hands: None  Permission Sought/Granted Permission sought to share information with : Case Manager, Family Supports, PCP Permission granted to share information with : Yes, Verbal Permission Granted  Share Information with NAME: Lynelle Doctor     Permission granted to share info w Relationship: SO  Permission granted to share info w Contact Information: [phone]  Emotional Assessment   Attitude/Demeanor/Rapport: Engaged Affect (typically observed): Accepting Orientation: : Oriented to Self, Oriented to Place, Oriented to  Time, Oriented to Situation   Psych Involvement: No (comment)  Admission diagnosis:  Upper GI bleeding [K92.2] Coffee ground emesis [K92.0] Symptomatic anemia [D64.9] Patient Active Problem List   Diagnosis Date Noted   Marginal ulcer 03/12/2023   Small bowel anastomotic stricture 03/12/2023   Coffee ground emesis 03/11/2023   NSAID long-term use 03/11/2023   Anemia, posthemorrhagic, acute 03/11/2023   Upper GI bleeding 03/10/2023   Hypertension associated with type 2 diabetes mellitus (HCC) 01/15/2023   Coronary artery disease of native artery of native heart with stable angina pectoris (HCC) 01/15/2023   Constipation 10/25/2022   Upper abdominal pain 07/06/2022   Nausea and vomiting 07/06/2022   Hyponatremia 06/23/2021   Low serum vitamin B12 03/23/2021   Vitamin D deficiency 03/23/2021   S/P bypass gastrojejunostomy 01/03/2021   Gastric outlet obstruction    Duodenal stricture    Primary osteoarthritis of both hips 11/15/2020   Aortic atherosclerosis (HCC) 08/30/2020  Coronary artery calcification 08/30/2020   Bilateral carpal tunnel syndrome 06/07/2020   Grief reaction 06/07/2020   Pyloric stenosis in adult 02/05/2020   Congenital hypertrophic pyloric stenosis     Duodenal web    Barrett's esophagus without dysplasia 05/19/2019   OSA (obstructive sleep apnea) 04/30/2019   Iron deficiency anemia 11/25/2018   Phimosis of penis 11/19/2018   Esophageal dysphagia 08/19/2018   Benign prostatic hyperplasia with post-void dribbling 01/31/2018   Absence of bladder continence 01/31/2018   Obesity (BMI 30-39.9) 11/05/2017   GERD (gastroesophageal reflux disease) 06/03/2017   Chronic pain of both knees 06/03/2017   Chronic lower back pain 06/03/2017   Type 2 diabetes mellitus without complication, without long-term current use of insulin (HCC) 04/12/2016   Essential hypertension 04/12/2016   PCP:  Marcine Matar, MD Pharmacy:   [facility] - [facility] [address] [place] [address] Phone: [phone] Fax: [phone]  [id number] [place], [place] - [address] AT [facility] OF [address] [place] [address] Phone: [phone] Fax: [phone]     Social Determinants of Health (SDOH) Social History: SDOH Screenings   Food Insecurity: No Food Insecurity (03/10/2023)  Housing: Low Risk  (03/10/2023)  Transportation Needs: No Transportation Needs (03/10/2023)  Utilities: Not At Risk (03/10/2023)  Depression (PHQ2-9): Low Risk  (09/14/2022)  Tobacco Use: Medium Risk (03/11/2023)   SDOH Interventions:     Readmission Risk Interventions     No data to display

## 2023-03-12 NOTE — Progress Notes (Addendum)
Progress Note   Subjective  Chief Complaint: Coffee-ground emesis  EGD 03/11/2023 with long segment Barrett's esophagus, large amount of food residue in the stomach which could not be cleared making for short exam, previous surgical anastomosis was found characterized by large ulceration which was the cause of patient's bleeding, acquired stenosis at the entrance of the small bowel which was very severe causing partial gastric outlet obstruction in close approximation to the ulcer-recommended dilation of the stricture at some point after healing of the ulcer.  Today, patient tells me that he feels okay overnight.  Does describe that sometimes when he is eating he gets nauseous.  He would like to go home if it is okay with [name].  Does tell me he has a scheduled EGD on 03/30/2023 with his primary GI.   Objective   Vital signs in last 24 hours: Temp:  [97.9 F (36.6 C)-98.6 F (37 C)] 98 F (36.7 C) (05/27 0815) Pulse Rate:  [71-87] 75 (05/27 0815) Resp:  [15-25] 21 (05/27 0815) BP: (115-133)/(48-65) 118/52 (05/27 0815) SpO2:  [98 %-100 %] 98 % (05/27 0815)   General:    white male in NAD Heart:  Regular rate and rhythm; no murmurs Lungs: Respirations even and unlabored, lungs CTA bilaterally Abdomen:  Soft, nontender and nondistended. Normal bowel sounds. Psych:  Cooperative. Normal mood and affect.  Intake/Output from previous day: 05/26 0701 - 05/27 0700 In: 1592.7 [I.V.:915; Blood:627.7; IV Piggyback:50] Out: -    Lab Results: Recent Labs    03/11/23 0543 03/11/23 2128 03/12/23 0740  WBC 8.1 5.6 5.2  HGB 6.6* 8.7* 8.6*  HCT 21.3* 27.5* 27.1*  PLT 237 205 203   BMET Recent Labs    03/10/23 1741 03/11/23 0543 03/12/23 0740  NA 130* 126* 132*  K 4.0 4.3 3.9  CL 93* 99 103  CO2 27 20* 23  GLUCOSE 234* 124* 103*  BUN 27* 21* 11  CREATININE 0.97 0.91 0.95  CALCIUM 9.4 8.0* 8.5*   LFT Recent Labs    03/11/23 0543  PROT 5.6*  ALBUMIN 3.0*  AST 18  ALT 23   ALKPHOS 51  BILITOT 0.7   PT/INR Recent Labs    03/11/23 0543  LABPROT 15.4*  INR 1.2     Assessment / Plan:   Assessment: 1.  Upper GI bleed: EGD 03/11/2023 with anastomotic ulcer and tight duodenal stricture thought to be the source of all of the patient's problems, hemoglobin stable overnight 2.  Acute blood loss anemia/iron deficiency anemia 3.  History of Roux-en-Y gastric bypass in 2022 4.  History of chronic duodenal stricture  Plan: 1.  Discussed EGD results with the patient today.  Told him he needs to follow-up with his primary GI clinic after discharge.  Did discuss with Dr. Adela Lank that patient's current outpatient EGD is scheduled for 6/14.  This would be okay, but ideally his procedure will be scheduled at least 3 to 4 weeks from now.  Patient has already alerted his primary GI team and is asking to be moved out of a week. 2.  Discussed that patient should remain on a liquid diet for a while, this will likely help all of his symptoms then could may be gradually moved to soft food given his stricture. 3.  Discussed patient will need to stay on Pantoprazole 40 mg twice daily at home. 4.  Discussed importance of patient remaining off of NSAIDs.  He will need to meet with his PCP to discuss pain  management options.  Thanks for your kind consultation.  We will sign off.    LOS: 2 days   Unk Lightning  03/12/2023, 9:55 AM

## 2023-03-12 NOTE — Discharge Summary (Signed)
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  In the emergency room his hemoglobin was initially 8-that dropped to 6.6.  Started on Protonix infusion and sent to hospital for admission.  Acute blood loss anemia, acute upper GI bleeding.  Presented with hematemesis. NSAID induced gastric ulceration. Presentation hemoglobin 8.2-6.6.  Baseline hemoglobin 10-11. Received total 2 units of PRBC transfusion with appropriate response, hemoglobin more than 8.  No evidence of ongoing bleeding. Patient underwent upper GI endoscopy and found to have large nonbleeding ulcer along the anastomosis.  No evidence of ongoing bleeding.  Patient likely had bleeding from anastomotic ulcer secondary to NSAID use. Patient also has evidence of slow transit with retained food in the stomach because of Roux-en-Y.  Plan: As per GI recommendations Protonix 40 mg p.o. twice daily to continue Carafate 1 g 3 times daily to continue Avoid all NSAIDs, he will use lidocaine patch and tramadol for his chronic pain issues.  He is prescribed tramadol by his primary care  physician.  Will prescribe lidocaine patches. Patient will stay on full liquid diet and soft diet until follow-up.  He is a scheduled to have repeat endoscopy on 6/14 that he will keep up.  Chronic medical issues including Essential hypertension, stable Type 2 diabetes, on oral sulfonylurea.  Stable.  Patient has tolerated liquid diet and hemoglobin has remained stable.  Going home with outpatient follow-up.   Discharge Diagnoses:  Principal Problem:   Upper GI bleeding Active Problems:   Coffee ground emesis   NSAID long-term use   Anemia, posthemorrhagic, acute    Discharge Instructions  Discharge Instructions     Call MD for:  persistant nausea and vomiting   Complete by: As directed    Diet Carb Modified   Complete by: As directed    Stay on Full liquid diet until follow up   Increase activity slowly   Complete by: As directed       Allergies as of 03/12/2023       Reactions   Cymbalta [duloxetine Hcl] Other (See Comments)   Caused anorgasmia   Doxycycline    Unknown   Other Nausea And Vomiting   Mayonnaise mustard ketchup   Robaxin [methocarbamol] Other (See Comments)   Had hiccups x 4 hours after taking        Medication List     STOP taking these medications    loperamide 2 MG tablet Commonly known as: IMODIUM A-D   naproxen sodium 220 MG tablet Commonly known as: ALEVE       TAKE these medications    acetaminophen 500 MG tablet Commonly known as: TYLENOL Take 1,000 mg by mouth in the morning and at bedtime.   cyanocobalamin 1000 MCG tablet Commonly known as:  VITAMIN B12 Take 1,000 mcg by mouth daily.   cyclobenzaprine 10 MG tablet Commonly known as: FLEXERIL Take 1 tablet (10 mg total) by mouth 2 (two) times daily as needed for muscle spasms.   FeroSul 325 (65 FE) MG tablet Generic drug: ferrous sulfate Take 1 tablet (325 mg total) by mouth 2 (two) times daily with a meal.   fluticasone 50 MCG/ACT nasal spray Commonly known as:  FLONASE Place 1 spray into both nostrils daily as needed for allergies or rhinitis.   glipiZIDE 5 MG tablet Commonly known as: GLUCOTROL Take 1 tablet (5 mg total) in the morning AND 1 tablet (5 mg total) every evening.   glucose blood test strip Use as instructed   lidocaine 5 % Commonly known as: LIDODERM Place 1 patch onto the skin daily. Remove & Discard patch within 12 hours or as directed by MD Start taking on: Mar 13, 2023   lisinopril 20 MG tablet Commonly known as: ZESTRIL Take 1 tablet (20 mg total) by mouth in the morning and at bedtime.   loratadine 10 MG tablet Commonly known as: CLARITIN Take 10 mg by mouth daily.   Magnesium Oxide -Mg Supplement 200 MG Tabs Take 200 mg by mouth in the morning and at bedtime.   ondansetron 8 MG tablet Commonly known as: Zofran Take 1 tablet (8 mg total) by mouth daily as needed for nausea or vomiting.   pantoprazole 40 MG tablet Commonly known as: PROTONIX Take 1 tablet (40 mg total) by mouth 2 (two) times daily before a meal.   Potassium 99 MG Tabs Take 99 mg by mouth daily at 6 (six) AM.   rOPINIRole 1 MG tablet Commonly known as: REQUIP Take 1 tablet (1 mg total) by mouth at bedtime.   sildenafil 50 MG tablet Commonly known as: VIAGRA Take 1 tablet (50 mg total) by mouth 1/2 hr before intercourse.  Limit use to 1 tab/24 hr   sucralfate 1 GM/10ML suspension Commonly known as: CARAFATE Take 10 mLs (1 g total) by mouth every 8 (eight) hours.   traMADol 50 MG tablet Commonly known as: ULTRAM Take 1 tablet (50 mg total) by mouth every 8 (eight) hours as needed   True Metrix Meter w/Device Kit 1 each by Does not apply route 4 (four) times daily -  before meals and at bedtime.        Allergies  Allergen Reactions   Cymbalta [Duloxetine Hcl] Other (See Comments)    Caused anorgasmia   Doxycycline     Unknown   Other Nausea And Vomiting    Mayonnaise mustard ketchup   Robaxin [Methocarbamol] Other (See  Comments)    Had hiccups x 4 hours after taking    Consultations: Gastroenterology   Procedures/Studies: No results found. (Echo, Carotid, EGD, Colonoscopy, ERCP)    Subjective: Seen in the morning rounds.  Denies any complaints.  Eager to go home.   Discharge Exam: Vitals:   03/12/23 0126 03/12/23 0815  BP: (!) 131/48 (!) 118/52  Pulse: 73 75  Resp: 19 (!) 21  Temp: 98 F (36.7 C) 98 F (36.7 C)  SpO2: 99% 98%   Vitals:   03/11/23 1740 03/11/23 1926 03/12/23 0126 03/12/23 0815  BP: (!) 125/59 132/65 (!) 131/48 (!) 118/52  Pulse: 71 80 73 75  Resp: 17 18 19  (!) 21  Temp: 98.3 F (36.8 C) 98.1 F (36.7 C) 98 F (36.7 C) 98 F (36.7 C)  TempSrc: Oral Oral Oral Oral  SpO2:  100% 100% 99% 98%  Weight:      Height:        General: Pt is alert, awake, not in acute distress Cardiovascular: RRR, S1/S2 +, no rubs, no gallops Respiratory: CTA bilaterally, no wheezing, no rhonchi Abdominal: Soft, NT, ND, bowel sounds + Extremities: no edema, no cyanosis    The results of significant diagnostics from this hospitalization (including imaging, microbiology, ancillary and laboratory) are listed below for reference.     Microbiology: No results found for this or any previous visit (from the past 240 hour(s)).   Labs: BNP (last 3 results) No results for input(s): "BNP" in the last 8760 hours. Basic Metabolic Panel: Recent Labs  Lab 03/10/23 1741 03/11/23 0543 03/12/23 0740  NA 130* 126* 132*  K 4.0 4.3 3.9  CL 93* 99 103  CO2 27 20* 23  GLUCOSE 234* 124* 103*  BUN 27* 21* 11  CREATININE 0.97 0.91 0.95  CALCIUM 9.4 8.0* 8.5*  MG  --  2.0 2.1  PHOS  --  2.5  --    Liver Function Tests: Recent Labs  Lab 03/10/23 1741 03/11/23 0543  AST 17 18  ALT 20 23  ALKPHOS 65 51  BILITOT 0.2* 0.7  PROT 6.6 5.6*  ALBUMIN 4.2 3.0*   Recent Labs  Lab 03/10/23 1741  LIPASE 46   No results for input(s): "AMMONIA" in the last 168 hours. CBC: Recent Labs  Lab  03/10/23 1741 03/11/23 0058 03/11/23 0543 03/11/23 2128 03/12/23 0740  WBC 10.8*  --  8.1 5.6 5.2  NEUTROABS  --   --   --   --  3.2  HGB 8.2* 7.3* 6.6* 8.7* 8.6*  HCT 26.2* 24.1* 21.3* 27.5* 27.1*  MCV 90.0  --  89.9 88.7 90.3  PLT 319  --  237 205 203   Cardiac Enzymes: No results for input(s): "CKTOTAL", "CKMB", "CKMBINDEX", "TROPONINI" in the last 168 hours. BNP: Invalid input(s): "POCBNP" CBG: Recent Labs  Lab 03/10/23 2334 03/11/23 1545 03/12/23 0124 03/12/23 0406 03/12/23 0813  GLUCAP 208* 121* 112* 101* 100*   D-Dimer No results for input(s): "DDIMER" in the last 72 hours. Hgb A1c No results for input(s): "HGBA1C" in the last 72 hours. Lipid Profile No results for input(s): "CHOL", "HDL", "LDLCALC", "TRIG", "CHOLHDL", "LDLDIRECT" in the last 72 hours. Thyroid function studies No results for input(s): "TSH", "T4TOTAL", "T3FREE", "THYROIDAB" in the last 72 hours.  Invalid input(s): "FREET3" Anemia work up Recent Labs    03/10/23 1805  VITAMINB12 2,048*  FOLATE 26.9  FERRITIN 5*  TIBC 405  IRON 27*  RETICCTPCT 4.7*   Urinalysis    Component Value Date/Time   COLORURINE YELLOW 03/10/2023 2050   APPEARANCEUR CLEAR 03/10/2023 2050   APPEARANCEUR Clear 03/04/2021 1337   LABSPEC 1.020 03/10/2023 2050   PHURINE 6.5 03/10/2023 2050   GLUCOSEU >1,000 (A) 03/10/2023 2050   GLUCOSEU NEGATIVE 06/23/2021 0940   HGBUR NEGATIVE 03/10/2023 2050   BILIRUBINUR NEGATIVE 03/10/2023 2050   BILIRUBINUR Negative 03/04/2021 1337   KETONESUR TRACE (A) 03/10/2023 2050   PROTEINUR NEGATIVE 03/10/2023 2050   UROBILINOGEN 0.2 06/23/2021 0940   NITRITE NEGATIVE 03/10/2023 2050   LEUKOCYTESUR SMALL (A) 03/10/2023 2050   Sepsis Labs Recent Labs  Lab 03/10/23 1741 03/11/23 0543 03/11/23 2128 03/12/23 0740  WBC 10.8* 8.1 5.6 5.2   Microbiology No results found for this or any previous visit (from the past 240 hour(s)).   Time coordinating discharge:  32  minutes  SIGNED:  Dorcas Carrow, MD  [facility] 03/12/2023, 10:18 AM

## 2023-03-13 ENCOUNTER — Other Ambulatory Visit: Payer: Self-pay | Admitting: Internal Medicine

## 2023-03-13 ENCOUNTER — Encounter: Payer: Self-pay | Admitting: Physician Assistant

## 2023-03-13 ENCOUNTER — Other Ambulatory Visit: Payer: Self-pay

## 2023-03-13 ENCOUNTER — Telehealth: Payer: Self-pay

## 2023-03-13 ENCOUNTER — Other Ambulatory Visit: Payer: Self-pay | Admitting: Pharmacist

## 2023-03-13 ENCOUNTER — Encounter: Payer: Self-pay | Admitting: Internal Medicine

## 2023-03-13 DIAGNOSIS — E119 Type 2 diabetes mellitus without complications: Secondary | ICD-10-CM

## 2023-03-13 LAB — HEMOGLOBIN A1C
Hgb A1c MFr Bld: 4.6 % — ABNORMAL LOW (ref 4.8–5.6)
Mean Plasma Glucose: 85 mg/dL

## 2023-03-13 MED ORDER — LANCETS 28G MISC
3 refills | Status: DC
  Filled 2023-03-13: qty 100, 25d supply, fill #0

## 2023-03-13 MED ORDER — ACCU-CHEK GUIDE VI STRP
ORAL_STRIP | 3 refills | Status: DC
  Filled 2023-03-13: qty 100, fill #0
  Filled 2023-03-13: qty 100, 33d supply, fill #0

## 2023-03-13 MED ORDER — TRUEPLUS LANCETS 28G MISC
3 refills | Status: DC
  Filled 2023-03-13: qty 100, 33d supply, fill #0

## 2023-03-13 MED ORDER — TRUE METRIX BLOOD GLUCOSE TEST VI STRP
ORAL_STRIP | 12 refills | Status: DC
  Filled 2023-03-13: qty 100, 25d supply, fill #0

## 2023-03-13 MED ORDER — TRUE METRIX BLOOD GLUCOSE TEST VI STRP
ORAL_STRIP | 3 refills | Status: DC
  Filled 2023-03-13 (×2): qty 100, 33d supply, fill #0

## 2023-03-13 MED ORDER — ACCU-CHEK GUIDE W/DEVICE KIT
PACK | 0 refills | Status: DC
  Filled 2023-03-13: qty 1, 30d supply, fill #0

## 2023-03-13 MED ORDER — ACCU-CHEK SOFTCLIX LANCETS MISC
3 refills | Status: DC
  Filled 2023-03-13: qty 100, 33d supply, fill #0

## 2023-03-13 NOTE — Transitions of Care (Post Inpatient/ED Visit) (Unsigned)
03/13/2023  Name: Jeremy [name] MRN: [id number] DOB: Aug 16, 1961  Today's TOC FU Call Status: Today's TOC FU Call Status:: Successful TOC FU Call Competed TOC FU Call Complete Date: 03/13/23  Transition Care Management Follow-up Telephone Call Date of Discharge: 03/12/23 Discharge Facility: [facility]) Type of Discharge: Inpatient Admission Primary Inpatient Discharge Diagnosis:: Upper GI Bleed How have you been since you were released from the hospital?: Better Any questions or concerns?: No  Items Reviewed: Did you receive and understand the discharge instructions provided?: Yes Medications obtained,verified, and reconciled?: No (He said he did not need to review the med list) Medications Not Reviewed Reasons:: Other: (He has to pick up the carafate, he has all other meds except the lidoderm patch.  He stated that the patch is not covered by his insurance and he is not able to afford the out of pocket expense. I told him I would check with our clinical RPH and PCP.) Any new allergies since your discharge?: No Dietary orders reviewed?: Yes Type of Diet Ordered:: full liquid diet.  He said he has been adhering to this with a few adaptations. Do you have support at home?: Yes  Medications Reviewed Today:  he did not want to review the medication list.  Medications Reviewed Today     Reviewed by Vonita Moss, CPhT (Pharmacy Technician) on 03/11/23 at 1447  Med List Status: F/U 2nd Shift   Medication Order Taking? Sig Documenting Provider Last Dose Status Informant  acetaminophen (TYLENOL) 500 MG tablet [id number]  Take 1,000 mg by mouth in the morning and at bedtime.  [provider]  Active Self  Blood Glucose Monitoring Suppl (TRUE METRIX METER) w/Device KIT [id number]  1 each by Does not apply route 4 (four) times daily -  before meals and at bedtime. Storm Frisk, MD  Active Self  cyclobenzaprine (FLEXERIL) 10 MG tablet [id number]  Take 1 tablet (10 mg total)  by mouth 2 (two) times daily as needed for muscle spasms. Marcine Matar, MD  Active   ferrous sulfate (FEROSUL) 325 (65 FE) MG tablet [id number]  Take 1 tablet (325 mg total) by mouth 2 (two) times daily with a meal. Marcine Matar, MD  Active   fluticasone (FLONASE) 50 MCG/ACT nasal spray [id number]  Place 1 spray into both nostrils daily as needed for allergies or rhinitis. Marcine Matar, MD  Active   glipiZIDE (GLUCOTROL) 5 MG tablet [id number]  Take 1 tablet (5 mg total) in the morning AND 1 tablet (5 mg total) every evening. Marcine Matar, MD  Active   glucose blood test strip [id number]  Use as instructed Marcine Matar, MD  Active Self  lisinopril (ZESTRIL) 20 MG tablet [id number]  Take 1 tablet (20 mg total) by mouth in the morning and at bedtime. Marcine Matar, MD  Active   loperamide (IMODIUM A-D) 2 MG tablet [id number]  Take 2 mg by mouth 4 (four) times daily as needed for diarrhea or loose stools.  Patient not taking: Reported on 03/01/2023   [provider]  Active Self  loratadine (CLARITIN) 10 MG tablet [id number]  Take 10 mg by mouth daily. [provider]  Active Self  Magnesium Oxide 200 MG TABS [id number]  Take 200 mg by mouth in the morning and at bedtime. [provider]  Active Self  naproxen sodium (ALEVE) 220 MG tablet [id number]  Take 440 mg by mouth in the morning and at bedtime. [provider]  Active Self  ondansetron (ZOFRAN) 8 MG tablet [id number]  Take 1 tablet (8 mg total) by mouth daily as needed for nausea or vomiting. Marcine Matar, MD  Active            Med Note Seward Meth, [place] I   Thu Jul 06, 2022  8:41 AM) As needed  pantoprazole (PROTONIX) 40 MG tablet [id number]  Take 1 tablet (40 mg total) by mouth 2 (two) times daily before a meal. Marcine Matar, MD  Active            Med Note Joanne Gavel, CRYSTAL L   Wed Oct 25, 2022  8:35 AM) Monday and Fridays only.  Potassium 99 MG TABS [id number]  Take 99 mg  by mouth daily at 6 (six) AM. [provider]  Active Self  rOPINIRole (REQUIP) 1 MG tablet [id number]  Take 1 tablet (1 mg total) by mouth at bedtime. Marcine Matar, MD  Active   sildenafil (VIAGRA) 50 MG tablet [id number]  Take 1 tablet (50 mg total) by mouth 1/2 hr before intercourse.  Limit use to 1 tab/24 hr Marcine Matar, MD  Active   traMADol (ULTRAM) 50 MG tablet [id number]  Take 1 tablet (50 mg total) by mouth every 8 (eight) hours as needed Marcine Matar, MD  Active   vitamin B-12 (CYANOCOBALAMIN) 1000 MCG tablet [id number]  Take 1,000 mcg by mouth daily. [provider]  Active Self            Home Care and Equipment/Supplies: Were Home Health Services Ordered?: No Any new equipment or medical supplies ordered?: No  Functional Questionnaire: Do you need assistance with bathing/showering or dressing?: No Do you need assistance with meal preparation?: No Do you need assistance with eating?: No Do you have difficulty maintaining continence: No Do you need assistance with getting out of bed/getting out of a chair/moving?: No Do you have difficulty managing or taking your medications?: No  Follow up appointments reviewed: PCP Follow-up appointment confirmed?: Yes Date of PCP follow-up appointment?: 03/26/23 Follow-up Provider: [facility] Follow-up appointment confirmed?: Yes Date of Specialist follow-up appointment?: 03/14/23 Follow-Up Specialty Provider:: oncology.  04/02/2023-neurology.  He also noted that he is waiting to hear from GI about scheduling a follow up appointment and re-scheduling an EGD. Do you need transportation to your follow-up appointment?: No Do you understand care options if your condition(s) worsen?: Yes-patient verbalized understanding    SIGNATURE Robyne Peers, RN

## 2023-03-13 NOTE — Telephone Encounter (Addendum)
We will have to wait for July schedule to reschedule. I have sent endo a message to cancel for now. Since he is coming in before anyways.  Mandy schedule appt per Baxter Hire below thanks!

## 2023-03-13 NOTE — Telephone Encounter (Signed)
Please let patient know I reviewed his chart. I agree,  his endoscopy will need to be pushed out 2 weeks from its current scheduled date. Additionally, I would like to see him back in the office in 1-2 weeks to ensure he is doing ok prior to EGD.    Mandy, please arrange follow-up in 1-2 weeks.   Mindy: Can we reschedule patient's EGD for June 26th or later? I am planning to see him in the office prior considering recent hospitalization.

## 2023-03-14 ENCOUNTER — Other Ambulatory Visit: Payer: Self-pay | Admitting: Pharmacist

## 2023-03-14 ENCOUNTER — Inpatient Hospital Stay: Payer: Medicaid Other | Attending: Internal Medicine

## 2023-03-14 ENCOUNTER — Inpatient Hospital Stay: Payer: Medicaid Other | Admitting: Internal Medicine

## 2023-03-14 ENCOUNTER — Other Ambulatory Visit: Payer: Self-pay

## 2023-03-14 VITALS — BP 119/53 | HR 66 | Temp 97.8°F | Resp 16 | Wt 201.5 lb

## 2023-03-14 DIAGNOSIS — K912 Postsurgical malabsorption, not elsewhere classified: Secondary | ICD-10-CM | POA: Insufficient documentation

## 2023-03-14 DIAGNOSIS — Z9884 Bariatric surgery status: Secondary | ICD-10-CM | POA: Diagnosis not present

## 2023-03-14 DIAGNOSIS — D508 Other iron deficiency anemias: Secondary | ICD-10-CM

## 2023-03-14 DIAGNOSIS — D500 Iron deficiency anemia secondary to blood loss (chronic): Secondary | ICD-10-CM | POA: Diagnosis not present

## 2023-03-14 LAB — CBC WITH DIFFERENTIAL (CANCER CENTER ONLY)
Abs Immature Granulocytes: 0.04 10*3/uL (ref 0.00–0.07)
Basophils Absolute: 0.1 10*3/uL (ref 0.0–0.1)
Basophils Relative: 1 %
Eosinophils Absolute: 0.2 10*3/uL (ref 0.0–0.5)
Eosinophils Relative: 6 %
HCT: 26.9 % — ABNORMAL LOW (ref 39.0–52.0)
Hemoglobin: 8.6 g/dL — ABNORMAL LOW (ref 13.0–17.0)
Immature Granulocytes: 1 %
Lymphocytes Relative: 23 %
Lymphs Abs: 0.9 10*3/uL (ref 0.7–4.0)
MCH: 29.1 pg (ref 26.0–34.0)
MCHC: 32 g/dL (ref 30.0–36.0)
MCV: 90.9 fL (ref 80.0–100.0)
Monocytes Absolute: 0.5 10*3/uL (ref 0.1–1.0)
Monocytes Relative: 12 %
Neutro Abs: 2.4 10*3/uL (ref 1.7–7.7)
Neutrophils Relative %: 57 %
Platelet Count: 236 10*3/uL (ref 150–400)
RBC: 2.96 MIL/uL — ABNORMAL LOW (ref 4.22–5.81)
RDW: 14.5 % (ref 11.5–15.5)
WBC Count: 4.1 10*3/uL (ref 4.0–10.5)
nRBC: 0 % (ref 0.0–0.2)

## 2023-03-14 LAB — IRON AND IRON BINDING CAPACITY (CC-WL,HP ONLY)
Iron: 121 ug/dL (ref 45–182)
Saturation Ratios: 34 % (ref 17.9–39.5)
TIBC: 353 ug/dL (ref 250–450)
UIBC: 232 ug/dL (ref 117–376)

## 2023-03-14 LAB — FERRITIN: Ferritin: 197 ng/mL (ref 24–336)

## 2023-03-14 MED ORDER — ACCU-CHEK GUIDE VI STRP: ORAL_STRIP | 6 refills | Status: DC

## 2023-03-14 MED ORDER — ACCU-CHEK SOFTCLIX LANCETS MISC
2 refills | Status: AC
  Filled 2023-06-19: qty 100, 33d supply, fill #0
  Filled 2023-09-25: qty 100, 33d supply, fill #1

## 2023-03-14 MED ORDER — ACCU-CHEK GUIDE W/DEVICE KIT
PACK | 0 refills | Status: AC
  Filled 2023-06-19: qty 1, 30d supply, fill #0
  Filled 2023-07-03: qty 1, fill #0
  Filled 2023-09-25: qty 1, 30d supply, fill #0

## 2023-03-14 NOTE — Telephone Encounter (Signed)
noted 

## 2023-03-14 NOTE — Progress Notes (Signed)
[facility] [phone]   [phone]  OFFICE PROGRESS NOTE  Marcine Matar, MD [address] 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  PRIOR THERAPY: Iron infusion with Venofer 300 mg IV weekly for 3 weeks.  CURRENT THERAPY: Over-the-counter oral iron tablet with vitamin C twice daily.  INTERVAL HISTORY: DAXSTON ROBTOY 62 y.o. male returns to the clinic today for follow-up visit.  The patient was admitted to the hospital recently with gastrointestinal blood loss and anemia with hemoglobin down to 6.6.  The patient had upper endoscopy at Dr. Adela Lank and that showed Barrett's esophagus in the middle third of the esophagus and in the lower third of the esophagus.  There was evidence for esophagitis with no bleeding found in the mid to distal esophagus.  He received 2 units of PRBCs transfusion during his hospitalization as well as IV iron.  He is feeling a little bit better.  He denied having any current chest pain, shortness of breath, cough or hemoptysis.  He has no nausea, vomiting, diarrhea or constipation.  He has no headache or visual changes.  He is here today for evaluation and repeat blood work.   MEDICAL HISTORY: Past Medical History:  Diagnosis Date   Diabetes mellitus without complication (HCC)    GERD (gastroesophageal reflux disease)    Hyperlipidemia    Hypertension    IDA (iron deficiency anemia)    Rash of entire body 03/2016   Sleep apnea     ALLERGIES:  is allergic to cymbalta [duloxetine hcl], doxycycline, other, and robaxin [methocarbamol].  MEDICATIONS:  Current Outpatient Medications  Medication Sig Dispense Refill   Accu-Chek Softclix Lancets lancets Use to check blood sugar three times daily. 100 each 2   acetaminophen (TYLENOL) 500 MG tablet Take 1,000 mg by mouth in the morning and at bedtime.      Blood  Glucose Monitoring Suppl (ACCU-CHEK GUIDE) w/Device KIT Use to check blood sugar three times daily. 1 kit 0   Blood Glucose Monitoring Suppl (ACCU-CHEK GUIDE) w/Device KIT Use to check blood sugar three times daily. 1 kit 0   cyclobenzaprine (FLEXERIL) 10 MG tablet Take 1 tablet (10 mg total) by mouth 2 (two) times daily as needed for muscle spasms. 60 tablet 3   ferrous sulfate (FEROSUL) 325 (65 FE) MG tablet Take 1 tablet (325 mg total) by mouth 2 (two) times daily with a meal. 180 tablet 0   fluticasone (FLONASE) 50 MCG/ACT nasal spray Place 1 spray into both nostrils daily as needed for allergies or rhinitis. 16 g 0   glipiZIDE (GLUCOTROL) 5 MG tablet Take 1 tablet (5 mg total) in the morning AND 1 tablet (5 mg total) every evening. 180 tablet 0   glucose blood (ACCU-CHEK GUIDE) test strip Use to check blood sugar three times daily. 100 each 3   glucose blood (ACCU-CHEK GUIDE) test strip Use to check blood sugar three times daily. 100 each 6   lidocaine (LIDODERM) 5 % Place 1 patch onto the skin daily. Remove & Discard patch within 12 hours or as directed by MD 30 patch 0   lisinopril (ZESTRIL) 20 MG tablet Take 1 tablet (20 mg total) by mouth in the morning and at bedtime. 180 tablet 0   loratadine (CLARITIN) 10 MG tablet Take 10 mg by mouth daily.     Magnesium Oxide 200 MG TABS Take  200 mg by mouth in the morning and at bedtime.     ondansetron (ZOFRAN) 8 MG tablet Take 1 tablet (8 mg total) by mouth daily as needed for nausea or vomiting. 30 tablet 2   pantoprazole (PROTONIX) 40 MG tablet Take 1 tablet (40 mg total) by mouth 2 (two) times daily before a meal. 60 tablet 1   Potassium 99 MG TABS Take 99 mg by mouth daily at 6 (six) AM.     rOPINIRole (REQUIP) 1 MG tablet Take 1 tablet (1 mg total) by mouth at bedtime. 90 tablet 1   sildenafil (VIAGRA) 50 MG tablet Take 1 tablet (50 mg total) by mouth 1/2 hr before intercourse.  Limit use to 1 tab/24 hr 10 tablet 3   sucralfate (CARAFATE) 1  GM/10ML suspension Take 10 mLs (1 g total) by mouth every 8 (eight) hours. 420 mL 0   traMADol (ULTRAM) 50 MG tablet Take 1 tablet (50 mg total) by mouth every 8 (eight) hours as needed 90 tablet 2   vitamin B-12 (CYANOCOBALAMIN) 1000 MCG tablet Take 1,000 mcg by mouth daily.     No current facility-administered medications for this visit.    SURGICAL HISTORY:  Past Surgical History:  Procedure Laterality Date   BACK SURGERY  1993   lumbar   BIOPSY  02/07/2019   Procedure: BIOPSY;  Surgeon: West Bali, MD;  [facility];  Service: Endoscopy;;   BIOPSY  01/30/2022   Procedure: BIOPSY;  Surgeon: Lanelle Bal, DO;  [facility];  Service: Endoscopy;;   CERVICAL SPINE SURGERY  2000   COLONOSCOPY N/A 02/07/2019   Dr. Darrick Penna: External hemorrhoids next colonoscopy in 10 years   ESOPHAGOGASTRODUODENOSCOPY N/A 02/07/2019   Dr. Darrick Penna: Barrett's esophagus without dysplasia chronic inactive gastritis but no H. 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, nonbleeding duodenal diverticulum,   ESOPHAGOGASTRODUODENOSCOPY N/A 01/05/2020   Procedure: ESOPHAGOGASTRODUODENOSCOPY (EGD);  Surgeon: West Bali, MD;  [facility];  Service: Endoscopy;  Laterality: N/A;  10:30am   ESOPHAGOGASTRODUODENOSCOPY (EGD) WITH PROPOFOL N/A 12/06/2020   Procedure: ESOPHAGOGASTRODUODENOSCOPY (EGD) WITH PROPOFOL;  Surgeon: Lanelle Bal, DO;  [facility];  Service: Endoscopy;  Laterality: N/A;  11:45am   ESOPHAGOGASTRODUODENOSCOPY (EGD) WITH PROPOFOL N/A 12/22/2021   long-segment Barrett's s/p biopsy, gastric bypass with normal sized puch and intact staple line. GJ anastomosis with healthy mucosa. Gastritis.   ESOPHAGOGASTRODUODENOSCOPY (EGD) WITH PROPOFOL N/A 01/30/2022   Procedure: ESOPHAGOGASTRODUODENOSCOPY (EGD) WITH PROPOFOL;  Surgeon: Lanelle Bal, DO;  [facility];  Service: Endoscopy;   Laterality: N/A;  11:00am   GASTROJEJUNOSTOMY N/A 01/03/2021   Procedure: GASTROJEJUNOSTOMY;  Surgeon: [name], MD;  Location: [facility];  Service: General;  Laterality: N/A;   HAND SURGERY     SAVORY DILATION N/A 01/05/2020   Procedure: SAVORY DILATION;  Surgeon: West Bali, MD;  Location: [facility];  Service: Endoscopy;  Laterality: N/A;    REVIEW OF SYSTEMS:  A comprehensive review of systems was negative except for: Constitutional: positive for fatigue   PHYSICAL EXAMINATION: General appearance: alert, cooperative, fatigued, and no distress Head: Normocephalic, without obvious abnormality, atraumatic Neck: no adenopathy, no JVD, supple, symmetrical, trachea midline, and thyroid not enlarged, symmetric, no tenderness/mass/nodules Lymph nodes: Cervical, supraclavicular, and axillary nodes normal. Resp: clear to auscultation bilaterally Back: symmetric, no curvature. ROM normal. No CVA tenderness. Cardio: regular rate and rhythm, S1, S2 normal, no  murmur, click, rub or gallop GI: soft, non-tender; bowel sounds normal; no masses,  no organomegaly Extremities: extremities normal, atraumatic, no cyanosis or edema  ECOG PERFORMANCE STATUS: 1 - Symptomatic but completely ambulatory  Blood pressure (!) 119/53, pulse 66, temperature 97.8 F (36.6 C), temperature source Oral, resp. rate 16, weight 201 lb 8 oz (91.4 kg), SpO2 99 %.  LABORATORY DATA: Lab Results  Component Value Date   WBC 5.2 03/12/2023   HGB 8.6 (L) 03/12/2023   HCT 27.1 (L) 03/12/2023   MCV 90.3 03/12/2023   PLT 203 03/12/2023      Chemistry      Component Value Date/Time   NA 132 (L) 03/12/2023 0740   NA 138 05/17/2021 1657   K 3.9 03/12/2023 0740   CL 103 03/12/2023 0740   CO2 23 03/12/2023 0740   BUN 11 03/12/2023 0740   BUN 11 05/17/2021 1657   CREATININE 0.95 03/12/2023 0740   CREATININE 0.65 12/14/2022 0832   CREATININE 0.72 10/17/2016 1012      Component Value Date/Time   CALCIUM 8.5 (L)  03/12/2023 0740   ALKPHOS 51 03/11/2023 0543   AST 18 03/11/2023 0543   AST 24 12/14/2022 0832   ALT 23 03/11/2023 0543   ALT 21 12/14/2022 0832   BILITOT 0.7 03/11/2023 0543   BILITOT 0.2 (L) 12/14/2022 0832       RADIOGRAPHIC STUDIES: No results found.  ASSESSMENT AND PLAN: This is a very pleasant 62 years old white male with iron deficiency anemia secondary to malabsorption secondary to gastrojejunostomy in March 2022. The patient was treated with iron infusion with Venofer 300 mg IV for 3 doses.  Last dose was given in November 2022. The patient was recently treated with PRBCs transfusion as well as iron infusion during his hospitalization with the gastrointestinal blood loss. He also received iron infusion at the [facility] infusion [facility] several weeks ago. Repeat CBC today showed persistent anemia with hemoglobin of 8.6 and hematocrit 27.1%. Iron study and ferritin are still pending. I recommended for him to continue with the oral iron tablet for now. I will see him back for follow-up visit in 3 months for evaluation and repeat blood work.  If the patient has significant iron deficiency on the pending blood work, I will arrange for him to receive additional iron infusion. He was advised to call immediately if he has any concerning symptoms in the interval. All questions were answered. The patient knows to call the clinic with any problems, questions or concerns. We can certainly see the patient much sooner if necessary. The total time spent in the appointment was 20 minutes.  Disclaimer: This note was dictated with voice recognition software. Similar sounding words can inadvertently be transcribed and may not be corrected upon review.

## 2023-03-15 NOTE — Telephone Encounter (Signed)
I spoke to patient and informed him that Dr Laural Benes stated his concerns about the pain he has been experiencing  above his ears will be addressed when he is seen in June.  His appointment is 03/26/2023

## 2023-03-18 ENCOUNTER — Encounter: Payer: Self-pay | Admitting: Family Medicine

## 2023-03-18 ENCOUNTER — Encounter (HOSPITAL_COMMUNITY): Payer: Self-pay | Admitting: Gastroenterology

## 2023-03-18 ENCOUNTER — Encounter: Payer: Self-pay | Admitting: Internal Medicine

## 2023-03-21 ENCOUNTER — Other Ambulatory Visit: Payer: Self-pay

## 2023-03-23 ENCOUNTER — Encounter: Payer: Self-pay | Admitting: Internal Medicine

## 2023-03-23 ENCOUNTER — Other Ambulatory Visit: Payer: Self-pay

## 2023-03-26 ENCOUNTER — Inpatient Hospital Stay: Payer: Medicaid Other | Admitting: Family Medicine

## 2023-03-26 ENCOUNTER — Other Ambulatory Visit: Payer: Self-pay | Admitting: Internal Medicine

## 2023-03-26 DIAGNOSIS — M25512 Pain in left shoulder: Secondary | ICD-10-CM

## 2023-03-28 ENCOUNTER — Other Ambulatory Visit (HOSPITAL_COMMUNITY): Payer: Medicaid Other

## 2023-03-28 ENCOUNTER — Other Ambulatory Visit: Payer: Self-pay | Admitting: Internal Medicine

## 2023-03-28 ENCOUNTER — Other Ambulatory Visit: Payer: Self-pay

## 2023-03-28 DIAGNOSIS — M62838 Other muscle spasm: Secondary | ICD-10-CM

## 2023-03-28 MED ORDER — CYCLOBENZAPRINE HCL 10 MG PO TABS
10.0000 mg | ORAL_TABLET | Freq: Two times a day (BID) | ORAL | 0 refills | Status: DC | PRN
Start: 2023-03-28 — End: 2023-05-13
  Filled 2023-04-09: qty 60, 30d supply, fill #0

## 2023-03-28 NOTE — Telephone Encounter (Signed)
Requested medication (s) are due for refill today - yes  Requested medication (s) are on the active medication list -yes  Future visit scheduled -no  Last refill: 11/07/22 #60 3RF  Notes to clinic: non delegated Rx  Requested Prescriptions  Pending Prescriptions Disp Refills   cyclobenzaprine (FLEXERIL) 10 MG tablet 60 tablet 3    Sig: Take 1 tablet (10 mg total) by mouth 2 (two) times daily as needed for muscle spasms.     Not Delegated - Analgesics:  Muscle Relaxants Failed - 03/28/2023 12:20 AM      Failed - This refill cannot be delegated      Passed - Valid encounter within last 6 months    Recent Outpatient Visits           2 months ago Hypertension associated with type 2 diabetes mellitus [facility])   [place] [facility] Jonah Blue B, MD   6 months ago Type 2 diabetes mellitus with obesity [facility])   [place] [facility] Marcine Matar, MD   10 months ago Right elbow pain   [place] [facility] [place], [place] W, NP   10 months ago Type 2 diabetes mellitus with obesity [facility])   [place] [facility] Marcine Matar, MD   10 months ago Patient left before evaluation by physician   [facility] Marcine Matar, MD                 Requested Prescriptions  Pending Prescriptions Disp Refills   cyclobenzaprine (FLEXERIL) 10 MG tablet 60 tablet 3    Sig: Take 1 tablet (10 mg total) by mouth 2 (two) times daily as needed for muscle spasms.     Not Delegated - Analgesics:  Muscle Relaxants Failed - 03/28/2023 12:20 AM      Failed - This refill cannot be delegated      Passed - Valid encounter within last 6 months    Recent Outpatient Visits           2 months ago Hypertension associated with type 2 diabetes mellitus [facility])   [facility] Jonah Blue B, MD   6 months ago Type 2 diabetes  mellitus with [facility])   [place] [facility] Marcine Matar, MD   10 months ago Right elbow pain   [place] [facility] [place], [place] W, NP   10 months ago Type 2 diabetes mellitus with obesity [facility])   [place] [facility] Marcine Matar, MD   10 months ago Patient left before evaluation by physician   [facility] Marcine Matar, MD

## 2023-03-30 ENCOUNTER — Encounter (HOSPITAL_COMMUNITY): Payer: Self-pay

## 2023-03-30 ENCOUNTER — Ambulatory Visit (HOSPITAL_COMMUNITY): Admit: 2023-03-30 | Payer: Medicaid Other

## 2023-03-30 SURGERY — ESOPHAGOGASTRODUODENOSCOPY (EGD) WITH PROPOFOL
Anesthesia: Monitor Anesthesia Care

## 2023-03-30 NOTE — H&P (View-Only) (Signed)
  Referring Provider: Johnson, Deborah B, MD Primary Care Physician:  Johnson, Deborah B, MD Primary GI Physician: Dr. Carver  Chief Complaint  Patient presents with   Follow-up    Follow up. No problems.     HPI:   Jeremy [name] is a 61 y.o. 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 Jenkins on 01/03/2021.   Also with history of long segment Barrett's esophagus with last EGD in April 2023, previously not on daily PPI due to reports of feeling a "brick" in his stomach and early satiety, presenting today for hospital follow-up to discuss scheduling repeat EGD.   Admitted 5/25 to [facility] with coffee-ground emesis, symptomatic anemia with hemoglobin down as low as 6.6, previously 11.4 in February 2024.  Also with iron deficiency.  He underwent EGD 03/11/2023 showing reflux esophagitis, long segment Barrett's esophagus, large amount of food residue in the stomach that could not be cleared, prior surgical anastomosis characterized by large ulceration, acquired stenosis at entrance of the small bowel at anastomosis-severe-causing partial gastric outlet obstruction, in close approximation to the liver.  Recommended PPI twice daily, Carafate every 6 hours, would likely need liquid diet for some time until the ulcer heals.  Would need dilation of stricture at some point, but would need healing of the ulcer and improvement in edema prior to attempting dilation.  Recommended repeat EGD in 3 to 4 weeks.  He received 2 units PRBCs as well as IV iron and hemoglobin improved to 8.6 on day of discharge.   Iron levels normalized upon recheck on 5/29, Hgb remained stable at 8.6.  Today:  Eating regular foods. States he slowly advanced from liquids up to solids and seems to be  doing ok with this.  No nausea or vomiting. Had episode of vomiting 2 weekends ago after eating tough BBQ ribs, but otherwise has been going well.. No hematemesis, coffee-ground emesis.  No BRBPR or melena.  Little on the constipated side. Not taking anything for this.  This is more of a chronic issue. Can skip 2-3 days between BMs.   Has lost some weight since hospitalization. Not hungry. Eating but doesn't feel like he needs to. Gets full quickly.   Was taking Aleve BID but has stopped.  He is now avoiding all NSAIDs.   Past Medical History:  Diagnosis Date   Diabetes mellitus without complication (HCC)    GERD (gastroesophageal reflux disease)    Hyperlipidemia    Hypertension    IDA (iron deficiency anemia)    Rash of entire body 03/2016   Sleep apnea     Past Surgical History:  Procedure Laterality Date   BACK SURGERY  1993   lumbar   BIOPSY  02/07/2019   Procedure: BIOPSY;  Surgeon: Fields, Sandi L, MD;  Location: [facility];  Service: Endoscopy;;   BIOPSY  01/30/2022   Procedure: BIOPSY;  Surgeon: Carver, Charles K, DO;  Location: [facility];  Service: Endoscopy;;   CERVICAL SPINE SURGERY  2000   COLONOSCOPY N/A 02/07/2019   Dr. Fields: External hemorrhoids next colonoscopy in 10 years   ESOPHAGOGASTRODUODENOSCOPY N/A 02/07/2019   Dr. Fields: Barrett's esophagus without dysplasia chronic inactive gastritis but no H. 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, nonbleeding   duodenal diverticulum,   ESOPHAGOGASTRODUODENOSCOPY N/A 01/05/2020   Procedure: ESOPHAGOGASTRODUODENOSCOPY (EGD);  Surgeon: Fields, Sandi L, MD;  Location: [facility];  Service: Endoscopy;  Laterality: N/A;  10:30am   ESOPHAGOGASTRODUODENOSCOPY (EGD) WITH PROPOFOL N/A 12/06/2020   Procedure: ESOPHAGOGASTRODUODENOSCOPY (EGD) WITH PROPOFOL;  Surgeon: Carver, Charles K, DO;  [facility]: [facility];  Service: Endoscopy;  Laterality: N/A;  11:45am    ESOPHAGOGASTRODUODENOSCOPY (EGD) WITH PROPOFOL N/A 12/22/2021   long-segment Barrett's s/p biopsy, gastric bypass with normal sized puch and intact staple line. GJ anastomosis with healthy mucosa. Gastritis.   ESOPHAGOGASTRODUODENOSCOPY (EGD) WITH PROPOFOL N/A 01/30/2022   Procedure: ESOPHAGOGASTRODUODENOSCOPY (EGD) WITH PROPOFOL;  Surgeon: Carver, Charles K, DO;  Location: [facility];  Service: Endoscopy;  Laterality: N/A;  11:00am   ESOPHAGOGASTRODUODENOSCOPY (EGD) WITH PROPOFOL N/A 03/11/2023   Procedure: ESOPHAGOGASTRODUODENOSCOPY (EGD) WITH PROPOFOL;  Surgeon: Armbruster, Steven P, MD;  Location: [facility];  Service: Gastroenterology;  Laterality: N/A;   GASTROJEJUNOSTOMY N/A 01/03/2021   Procedure: GASTROJEJUNOSTOMY;  Surgeon: Jenkins, Mark, MD;  Location: [facility];  Service: General;  Laterality: N/A;   HAND SURGERY     SAVORY DILATION N/A 01/05/2020   Procedure: SAVORY DILATION;  Surgeon: Fields, Sandi L, MD;  Location: [facility];  Service: Endoscopy;  Laterality: N/A;    Current Outpatient Medications  Medication Sig Dispense Refill   Accu-Chek Softclix Lancets lancets Use to check blood sugar three times daily. 100 each 2   acetaminophen (TYLENOL) 500 MG tablet Take 1,000 mg by mouth in the morning and at bedtime.      Blood Glucose Monitoring Suppl (ACCU-CHEK GUIDE) w/Device KIT Use to check blood sugar three times daily. 1 kit 0   Blood Glucose Monitoring Suppl (ACCU-CHEK GUIDE) w/Device KIT Use to check blood sugar three times daily. 1 kit 0   cyclobenzaprine (FLEXERIL) 10 MG tablet Take 1 tablet (10 mg total) by mouth 2 (two) times daily as needed for muscle spasms. 60 tablet 0   ferrous sulfate (FEROSUL) 325 (65 FE) MG tablet Take 1 tablet (325 mg total) by mouth 2 (two) times daily with a meal. 180 tablet 0   fluticasone (FLONASE) 50 MCG/ACT nasal spray Place 1 spray into both nostrils daily as needed for allergies or rhinitis. 16 g 0   glipiZIDE (GLUCOTROL) 5 MG tablet  Take 1 tablet (5 mg total) in the morning AND 1 tablet (5 mg total) every evening. 180 tablet 0   glucose blood (ACCU-CHEK GUIDE) test strip Use to check blood sugar three times daily. 100 each 3   glucose blood (ACCU-CHEK GUIDE) test strip Use to check blood sugar three times daily. 100 each 6   lisinopril (ZESTRIL) 20 MG tablet Take 1 tablet (20 mg total) by mouth in the morning and at bedtime. 180 tablet 0   loratadine (CLARITIN) 10 MG tablet Take 10 mg by mouth daily.     Magnesium Oxide 200 MG TABS Take 200 mg by mouth in the morning and at bedtime.     ondansetron (ZOFRAN) 8 MG tablet Take 1 tablet (8 mg total) by mouth daily as needed for nausea or vomiting. 30 tablet 2   pantoprazole (PROTONIX) 40 MG tablet Take 1 tablet (40 mg total) by mouth 2 (two) times daily before a meal. 60 tablet 1   Potassium 99 MG TABS Take 99 mg by mouth daily at 6 (six) AM.     rOPINIRole (REQUIP) 1 MG tablet Take 1 tablet (1 mg total) by mouth at bedtime. 90   tablet 1   sildenafil (VIAGRA) 50 MG tablet Take 1 tablet (50 mg total) by mouth 1/2 hr before intercourse.  Limit use to 1 tab/24 hr 10 tablet 3   sucralfate (CARAFATE) 1 GM/10ML suspension Take 10 mLs (1 g total) by mouth every 8 (eight) hours. 420 mL 0   traMADol (ULTRAM) 50 MG tablet Take 1 tablet (50 mg total) by mouth every 8 (eight) hours as needed 90 tablet 2   vitamin B-12 (CYANOCOBALAMIN) 1000 MCG tablet Take 1,000 mcg by mouth daily.     No current facility-administered medications for this visit.    Allergies as of 04/02/2023 - Review Complete 04/02/2023  Allergen Reaction Noted   Cymbalta [duloxetine hcl] Other (See Comments) 06/05/2017   Doxycycline  03/02/2021   Other Nausea And Vomiting 01/24/2019   Robaxin [methocarbamol] Other (See Comments) 06/02/2019    Family History  Problem Relation Age of Onset   Breast cancer Mother    Pancreatic cancer Mother    CAD Father    Colon cancer Paternal Grandfather     Social History    Socioeconomic History   Marital status: Significant Other    Spouse name: Not on file   Number of children: Not on file   Years of education: Not on file   Highest education level: Not on file  Occupational History   Occupation: general contractor  Tobacco Use   Smoking status: Former    Types: Cigarettes    Quit date: 10/16/1988    Years since quitting: 34.4   Smokeless tobacco: Never  Vaping Use   Vaping Use: Never used  Substance and Sexual Activity   Alcohol use: No   Drug use: Yes    Comment: rare   Sexual activity: Not on file  Other Topics Concern   Not on file  Social History Narrative   Volunteers as firefighter   Social Determinants of Health   Financial Resource Strain: Not on file  Food Insecurity: No Food Insecurity (03/10/2023)   Hunger Vital Sign    Worried About Running Out of Food in the Last Year: Never true    Ran Out of Food in the Last Year: Never true  Transportation Needs: No Transportation Needs (03/10/2023)   PRAPARE - Transportation    Lack of Transportation (Medical): No    Lack of Transportation (Non-Medical): No  Physical Activity: Not on file  Stress: Not on file  Social Connections: Not on file    Review of Systems: Gen: Denies fever, chills, cold or flu like symptoms, pre-syncope, or syncope  CV: Denies chest pain, palpitations. Resp: Denies dyspnea at rest, cough. GI: See HPI Heme: See HPI  Physical Exam: BP 119/67 (BP Location: Right Arm, Patient Position: Sitting, Cuff Size: Normal)   Pulse 64   Temp 98.2 F (36.8 C) (Temporal)   Ht 5' 10" (1.778 m)   Wt 198 lb 3.2 oz (89.9 kg)   SpO2 99%   BMI 28.44 kg/m  General:   Alert and oriented. No distress noted. Pleasant and cooperative.  Head:  Normocephalic and atraumatic. Eyes:  Conjuctiva clear without scleral icterus. Heart:  S1, S2 present without murmurs appreciated. Lungs:  Clear to auscultation bilaterally. No wheezes, rales, or rhonchi. No distress.  Abdomen:  +BS,  soft, non-tender and non-distended. No rebound or guarding. No HSM or masses noted. Msk:  Symmetrical without gross deformities. Normal posture. Extremities:  Without edema. Neurologic:  Alert and  oriented x4 Psych:  Normal mood and affect.      Assessment:  61 y.o. 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 Also with iron deficiency anemia following with hematology for IV iron as needed, long segment Barrett's esophagus and previously not on PPI due to reports of early satiety and the sensation of a "brick" in his stomach with PPI therapy.  Recently admitted to [place] on 5/25 with coffee-ground emesis, symptomatic anemia with hemoglobin as low as 6.6, found to have large ulceration at prior surgical anastomosis with acquired stenosis that was severe causing partial gastric outlet obstruction, reflux esophagitis, long segment Barrett's esophagus.  He was started on PPI twice daily, Carafate every 6 hours, and advised for outpatient EGD in 3 to 4 weeks.  He is presenting today for hospital follow-up.  Clinically, he is feeling much better.  He has been compliant with PPI twice daily but is only taking Carafate about twice a day.  He slowly advance his diet himself and is tolerating solid foods.  He does report some early satiety.  He is down 19 pounds since February 2024.  Notably, he had previously been taking Aleve twice daily, but has since discontinued and is avoiding all NSAIDs.  This is likely the etiology of his anastomotic ulcer.   Regarding his acute on chronic anemia, he received 2 units PRBCs as well as IV iron while inpatient with hemoglobin improving to 8.6 on day of discharge.  He denies any recurrent overt GI bleeding.  I will plan to update his CBC at this time.   Patient also reports  chronic constipation with bowel movements every 2 to 3 days.  His colonoscopy is up-to-date in 2020, due for screening in 2030.  Recommended starting MiraLAX daily.   Plan:  CBC Proceed with upper endoscopy +/- dilation with propofol by Dr. Carver no sooner than  6/26. The risks, benefits, and alternatives have been discussed with the patient in detail. The patient states understanding and desires to proceed.  ASA 3 No AM diabetes medications morning of procedure. Full liquid diet 2 days prior.  Clear liquid diet 24 hours prior.  Continue pantoprazole 40 mg twice daily. Advised that he needed to be sure to take Carafate 4 times daily. Continue to avoid all NSAIDs. Start MiraLAX 17 g daily. Follow-up after EGD.    Deny Chevez, PA-C [facility] 04/02/2023  

## 2023-03-30 NOTE — Progress Notes (Signed)
Referring Provider: Marcine Matar, MD Primary Care Physician:  Marcine Matar, MD Primary GI Physician: Dr. Marletta Lor  Chief Complaint  Patient presents with   Follow-up    Follow up. No problems.     HPI:   Jeremy [name] is a 62 y.o. 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 Lovell Sheehan on 01/03/2021.   Also with history of long segment Barrett's esophagus with last EGD in April 2023, previously not on daily PPI due to reports of feeling a "brick" in his stomach and early satiety, presenting today for hospital follow-up to discuss scheduling repeat EGD.   Admitted 5/25 to [facility] with coffee-ground emesis, symptomatic anemia with hemoglobin down as low as 6.6, previously 11.4 in February 2024.  Also with iron deficiency.  He underwent EGD 03/11/2023 showing reflux esophagitis, long segment Barrett's esophagus, large amount of food residue in the stomach that could not be cleared, prior surgical anastomosis characterized by large ulceration, acquired stenosis at entrance of the small bowel at anastomosis-severe-causing partial gastric outlet obstruction, in close approximation to the liver.  Recommended PPI twice daily, Carafate every 6 hours, would likely need liquid diet for some time until the ulcer heals.  Would need dilation of stricture at some point, but would need healing of the ulcer and improvement in edema prior to attempting dilation.  Recommended repeat EGD in 3 to 4 weeks.  He received 2 units PRBCs as well as IV iron and hemoglobin improved to 8.6 on day of discharge.   Iron levels normalized upon recheck on 5/29, Hgb remained stable at 8.6.  Today:  Eating regular foods. States he slowly advanced from liquids up to solids and seems to be  doing ok with this.  No nausea or vomiting. Had episode of vomiting 2 weekends ago after eating tough BBQ ribs, but otherwise has been going well.. No hematemesis, coffee-ground emesis.  No BRBPR or melena.  Little on the constipated side. Not taking anything for this.  This is more of a chronic issue. Can skip 2-3 days between BMs.   Has lost some weight since hospitalization. Not hungry. Eating but doesn't feel like he needs to. Gets full quickly.   Was taking Aleve BID but has stopped.  He is now avoiding all NSAIDs.   Past Medical History:  Diagnosis Date   Diabetes mellitus without complication (HCC)    GERD (gastroesophageal reflux disease)    Hyperlipidemia    Hypertension    IDA (iron deficiency anemia)    Rash of entire body 03/2016   Sleep apnea     Past Surgical History:  Procedure Laterality Date   BACK SURGERY  1993   lumbar   BIOPSY  02/07/2019   Procedure: BIOPSY;  Surgeon: West Bali, MD;  Location: [facility];  Service: Endoscopy;;   BIOPSY  01/30/2022   Procedure: BIOPSY;  Surgeon: Lanelle Bal, DO;  Location: [facility];  Service: Endoscopy;;   CERVICAL SPINE SURGERY  2000   COLONOSCOPY N/A 02/07/2019   Dr. Darrick Penna: External hemorrhoids next colonoscopy in 10 years   ESOPHAGOGASTRODUODENOSCOPY N/A 02/07/2019   Dr. Darrick Penna: Barrett's esophagus without dysplasia chronic inactive gastritis but no H. 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, nonbleeding  duodenal diverticulum,   ESOPHAGOGASTRODUODENOSCOPY N/A 01/05/2020   Procedure: ESOPHAGOGASTRODUODENOSCOPY (EGD);  Surgeon: West Bali, MD;  Location: [facility];  Service: Endoscopy;  Laterality: N/A;  10:30am   ESOPHAGOGASTRODUODENOSCOPY (EGD) WITH PROPOFOL N/A 12/06/2020   Procedure: ESOPHAGOGASTRODUODENOSCOPY (EGD) WITH PROPOFOL;  Surgeon: Lanelle Bal, DO;  Location: [facility];  Service: Endoscopy;  Laterality: N/A;  11:45am    ESOPHAGOGASTRODUODENOSCOPY (EGD) WITH PROPOFOL N/A 12/22/2021   long-segment Barrett's s/p biopsy, gastric bypass with normal sized puch and intact staple line. GJ anastomosis with healthy mucosa. Gastritis.   ESOPHAGOGASTRODUODENOSCOPY (EGD) WITH PROPOFOL N/A 01/30/2022   Procedure: ESOPHAGOGASTRODUODENOSCOPY (EGD) WITH PROPOFOL;  Surgeon: Lanelle Bal, DO;  Location: [facility];  Service: Endoscopy;  Laterality: N/A;  11:00am   ESOPHAGOGASTRODUODENOSCOPY (EGD) WITH PROPOFOL N/A 03/11/2023   Procedure: ESOPHAGOGASTRODUODENOSCOPY (EGD) WITH PROPOFOL;  Surgeon: Benancio Deeds, MD;  Location: [facility] ENDOSCOPY;  Service: Gastroenterology;  Laterality: N/A;   GASTROJEJUNOSTOMY N/A 01/03/2021   Procedure: GASTROJEJUNOSTOMY;  Surgeon: [name], MD;  Location: [facility];  Service: General;  Laterality: N/A;   HAND SURGERY     SAVORY DILATION N/A 01/05/2020   Procedure: SAVORY DILATION;  Surgeon: West Bali, MD;  Location: [facility];  Service: Endoscopy;  Laterality: N/A;    Current Outpatient Medications  Medication Sig Dispense Refill   Accu-Chek Softclix Lancets lancets Use to check blood sugar three times daily. 100 each 2   acetaminophen (TYLENOL) 500 MG tablet Take 1,000 mg by mouth in the morning and at bedtime.      Blood Glucose Monitoring Suppl (ACCU-CHEK GUIDE) w/Device KIT Use to check blood sugar three times daily. 1 kit 0   Blood Glucose Monitoring Suppl (ACCU-CHEK GUIDE) w/Device KIT Use to check blood sugar three times daily. 1 kit 0   cyclobenzaprine (FLEXERIL) 10 MG tablet Take 1 tablet (10 mg total) by mouth 2 (two) times daily as needed for muscle spasms. 60 tablet 0   ferrous sulfate (FEROSUL) 325 (65 FE) MG tablet Take 1 tablet (325 mg total) by mouth 2 (two) times daily with a meal. 180 tablet 0   fluticasone (FLONASE) 50 MCG/ACT nasal spray Place 1 spray into both nostrils daily as needed for allergies or rhinitis. 16 g 0   glipiZIDE (GLUCOTROL) 5 MG tablet  Take 1 tablet (5 mg total) in the morning AND 1 tablet (5 mg total) every evening. 180 tablet 0   glucose blood (ACCU-CHEK GUIDE) test strip Use to check blood sugar three times daily. 100 each 3   glucose blood (ACCU-CHEK GUIDE) test strip Use to check blood sugar three times daily. 100 each 6   lisinopril (ZESTRIL) 20 MG tablet Take 1 tablet (20 mg total) by mouth in the morning and at bedtime. 180 tablet 0   loratadine (CLARITIN) 10 MG tablet Take 10 mg by mouth daily.     Magnesium Oxide 200 MG TABS Take 200 mg by mouth in the morning and at bedtime.     ondansetron (ZOFRAN) 8 MG tablet Take 1 tablet (8 mg total) by mouth daily as needed for nausea or vomiting. 30 tablet 2   pantoprazole (PROTONIX) 40 MG tablet Take 1 tablet (40 mg total) by mouth 2 (two) times daily before a meal. 60 tablet 1   Potassium 99 MG TABS Take 99 mg by mouth daily at 6 (six) AM.     rOPINIRole (REQUIP) 1 MG tablet Take 1 tablet (1 mg total) by mouth at bedtime. 90  tablet 1   sildenafil (VIAGRA) 50 MG tablet Take 1 tablet (50 mg total) by mouth 1/2 hr before intercourse.  Limit use to 1 tab/24 hr 10 tablet 3   sucralfate (CARAFATE) 1 GM/10ML suspension Take 10 mLs (1 g total) by mouth every 8 (eight) hours. 420 mL 0   traMADol (ULTRAM) 50 MG tablet Take 1 tablet (50 mg total) by mouth every 8 (eight) hours as needed 90 tablet 2   vitamin B-12 (CYANOCOBALAMIN) 1000 MCG tablet Take 1,000 mcg by mouth daily.     No current facility-administered medications for this visit.    Allergies as of 04/02/2023 - Review Complete 04/02/2023  Allergen Reaction Noted   Cymbalta [duloxetine hcl] Other (See Comments) 06/05/2017   Doxycycline  03/02/2021   Other Nausea And Vomiting 01/24/2019   Robaxin [methocarbamol] Other (See Comments) 06/02/2019    Family History  Problem Relation Age of Onset   Breast cancer Mother    Pancreatic cancer Mother    CAD Father    Colon cancer Paternal Grandfather     Social History    Socioeconomic History   Marital status: Significant Other    Spouse name: Not on file   Number of children: Not on file   Years of education: Not on file   Highest education level: Not on file  Occupational History   Occupation: Product/process development scientist  Tobacco Use   Smoking status: Former    Types: Cigarettes    Quit date: 10/16/1988    Years since quitting: 34.4   Smokeless tobacco: Never  Vaping Use   Vaping Use: Never used  Substance and Sexual Activity   Alcohol use: No   Drug use: Yes    Comment: rare   Sexual activity: Not on file  Other Topics Concern   Not on file  Social History Narrative   Volunteers as IT sales professional   Social Determinants of Health   Financial Resource Strain: Not on file  Food Insecurity: No Food Insecurity (03/10/2023)   Hunger Vital Sign    Worried About Running Out of Food in the Last Year: Never true    Ran Out of Food in the Last Year: Never true  Transportation Needs: No Transportation Needs (03/10/2023)   PRAPARE - Administrator, Civil Service (Medical): No    Lack of Transportation (Non-Medical): No  Physical Activity: Not on file  Stress: Not on file  Social Connections: Not on file    Review of Systems: Gen: Denies fever, chills, cold or flu like symptoms, pre-syncope, or syncope  CV: Denies chest pain, palpitations. Resp: Denies dyspnea at rest, cough. GI: See HPI Heme: See HPI  Physical Exam: BP 119/67 (BP Location: Right Arm, Patient Position: Sitting, Cuff Size: Normal)   Pulse 64   Temp 98.2 F (36.8 C) (Temporal)   Ht 5\' 10"  (1.778 m)   Wt 198 lb 3.2 oz (89.9 kg)   SpO2 99%   BMI 28.44 kg/m  General:   Alert and oriented. No distress noted. Pleasant and cooperative.  Head:  Normocephalic and atraumatic. Eyes:  Conjuctiva clear without scleral icterus. Heart:  S1, S2 present without murmurs appreciated. Lungs:  Clear to auscultation bilaterally. No wheezes, rales, or rhonchi. No distress.  Abdomen:  +BS,  soft, non-tender and non-distended. No rebound or guarding. No HSM or masses noted. Msk:  Symmetrical without gross deformities. Normal posture. Extremities:  Without edema. Neurologic:  Alert and  oriented x4 Psych:  Normal mood and affect.  Assessment:  62 y.o. 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 Lovell Sheehan on 01/03/2021. Also with iron deficiency anemia following with hematology for IV iron as needed, long segment Barrett's esophagus and previously not on PPI due to reports of early satiety and the sensation of a "brick" in his stomach with PPI therapy.  Recently admitted to [facility] on 5/25 with coffee-ground emesis, symptomatic anemia with hemoglobin as low as 6.6, found to have large ulceration at prior surgical anastomosis with acquired stenosis that was severe causing partial gastric outlet obstruction, reflux esophagitis, long segment Barrett's esophagus.  He was started on PPI twice daily, Carafate every 6 hours, and advised for outpatient EGD in 3 to 4 weeks.  He is presenting today for hospital follow-up.  Clinically, he is feeling much better.  He has been compliant with PPI twice daily but is only taking Carafate about twice a day.  He slowly advance his diet himself and is tolerating solid foods.  He does report some early satiety.  He is down 19 pounds since February 2024.  Notably, he had previously been taking Aleve twice daily, but has since discontinued and is avoiding all NSAIDs.  This is likely the etiology of his anastomotic ulcer.   Regarding his acute on chronic anemia, he received 2 units PRBCs as well as IV iron while inpatient with hemoglobin improving to 8.6 on day of discharge.  He denies any recurrent overt GI bleeding.  I will plan to update his CBC at this time.   Patient also reports  chronic constipation with bowel movements every 2 to 3 days.  His colonoscopy is up-to-date in 2020, due for screening in 2030.  Recommended starting MiraLAX daily.   Plan:  CBC Proceed with upper endoscopy +/- dilation with propofol by Dr. Marletta Lor no sooner than  6/26. The risks, benefits, and alternatives have been discussed with the patient in detail. The patient states understanding and desires to proceed.  ASA 3 No AM diabetes medications morning of procedure. Full liquid diet 2 days prior.  Clear liquid diet 24 hours prior.  Continue pantoprazole 40 mg twice daily. Advised that he needed to be sure to take Carafate 4 times daily. Continue to avoid all NSAIDs. Start MiraLAX 17 g daily. Follow-up after EGD.    Ermalinda Memos, PA-C [facility] 04/02/2023

## 2023-04-02 ENCOUNTER — Ambulatory Visit (INDEPENDENT_AMBULATORY_CARE_PROVIDER_SITE_OTHER): Payer: Medicaid Other | Admitting: Gastroenterology

## 2023-04-02 ENCOUNTER — Encounter: Payer: Self-pay | Admitting: Gastroenterology

## 2023-04-02 ENCOUNTER — Encounter: Payer: Self-pay | Admitting: *Deleted

## 2023-04-02 ENCOUNTER — Telehealth: Payer: Self-pay | Admitting: *Deleted

## 2023-04-02 ENCOUNTER — Telehealth: Payer: Self-pay | Admitting: Diagnostic Neuroimaging

## 2023-04-02 ENCOUNTER — Encounter: Payer: Self-pay | Admitting: Diagnostic Neuroimaging

## 2023-04-02 ENCOUNTER — Ambulatory Visit: Payer: Medicaid Other | Admitting: Diagnostic Neuroimaging

## 2023-04-02 VITALS — BP 119/67 | HR 64 | Temp 98.2°F | Ht 70.0 in | Wt 198.2 lb

## 2023-04-02 VITALS — BP 118/54 | HR 73 | Ht 70.0 in | Wt 197.6 lb

## 2023-04-02 DIAGNOSIS — K279 Peptic ulcer, site unspecified, unspecified as acute or chronic, without hemorrhage or perforation: Secondary | ICD-10-CM

## 2023-04-02 DIAGNOSIS — R251 Tremor, unspecified: Secondary | ICD-10-CM | POA: Diagnosis not present

## 2023-04-02 DIAGNOSIS — K9189 Other postprocedural complications and disorders of digestive system: Secondary | ICD-10-CM

## 2023-04-02 DIAGNOSIS — D649 Anemia, unspecified: Secondary | ICD-10-CM | POA: Diagnosis not present

## 2023-04-02 DIAGNOSIS — R270 Ataxia, unspecified: Secondary | ICD-10-CM

## 2023-04-02 DIAGNOSIS — R269 Unspecified abnormalities of gait and mobility: Secondary | ICD-10-CM

## 2023-04-02 DIAGNOSIS — K5900 Constipation, unspecified: Secondary | ICD-10-CM | POA: Diagnosis not present

## 2023-04-02 LAB — CBC WITH DIFFERENTIAL/PLATELET
Absolute Monocytes: 468 cells/uL (ref 200–950)
Basophils Absolute: 41 cells/uL (ref 0–200)
Basophils Relative: 0.9 %
Eosinophils Absolute: 149 cells/uL (ref 15–500)
Eosinophils Relative: 3.3 %
HCT: 36.1 % — ABNORMAL LOW (ref 38.5–50.0)
Hemoglobin: 11.6 g/dL — ABNORMAL LOW (ref 13.2–17.1)
Lymphs Abs: 1008 cells/uL (ref 850–3900)
MCH: 29.7 pg (ref 27.0–33.0)
MCHC: 32.1 g/dL (ref 32.0–36.0)
MCV: 92.6 fL (ref 80.0–100.0)
MPV: 11 fL (ref 7.5–12.5)
Monocytes Relative: 10.4 %
Neutro Abs: 2835 cells/uL (ref 1500–7800)
Neutrophils Relative %: 63 %
Platelets: 214 10*3/uL (ref 140–400)
RBC: 3.9 10*6/uL — ABNORMAL LOW (ref 4.20–5.80)
RDW: 13.8 % (ref 11.0–15.0)
Total Lymphocyte: 22.4 %
WBC: 4.5 10*3/uL (ref 3.8–10.8)

## 2023-04-02 NOTE — Telephone Encounter (Signed)
Avality PA: Diagnosis Code 1 K279 - Peptic ulc site unsp unsp as ac or chr w/o hemor or perf Diagnosis Code 2  D649 - Anemia unspecified Procedure Code 1  43235 - EGD DIAGNOSTIC BRUSH WASH Quantity  Days  Status NO AUTH REQUIRED  Procedure Code 2 16109 - EGD GUIDE WIRE INSERTION  Quantity 1 Days  Status NO AUTH REQUIRED

## 2023-04-02 NOTE — Patient Instructions (Addendum)
-   check MRI brain 

## 2023-04-02 NOTE — Telephone Encounter (Signed)
Jeremy [name]: [id number] exp. 04/02/23-05/31/23 sent to GI [phone]

## 2023-04-02 NOTE — Progress Notes (Unsigned)
[facility]  PATIENT: Jeremy [name] DOB: [birth year]  REFERRING CLINICIAN: Marcine Matar, MD HISTORY FROM: PATIENT REASON FOR VISIT: NEW CONSULT   HISTORICAL  CHIEF COMPLAINT:  Chief Complaint  Patient presents with   New Patient (Initial Visit)    Patient in room #7 and alone. Patient states he has tremors in both hands. Patient states it hard to hold on to his tools. Patient states he has issus with his right ear and its popping feeling.    HISTORY OF PRESENT ILLNESS:   62 year old male here for evaluation of tremor.  Symptoms started 1 year ago with left greater than right-sided postural and action tremor in upper extremities.  Also having some gait and balance difficulty.  Having some more difficulty with fine motor tasks.  No family history of tremor.  History of lumbar spine fusion in 1991 and cervical spine fusion in 2000.  No other numbness or tingling.  No head tremor.  No voice tremor.   REVIEW OF SYSTEMS: Full 14 system review of systems performed and negative with exception of: as per HPI.  ALLERGIES: Allergies  Allergen Reactions   Cymbalta [Duloxetine Hcl] Other (See Comments)    Caused anorgasmia   Doxycycline     Unknown   Other Nausea And Vomiting    Mayonnaise mustard ketchup   Robaxin [Methocarbamol] Other (See Comments)    Had hiccups x 4 hours after taking    HOME MEDICATIONS: Outpatient Medications Prior to Visit  Medication Sig Dispense Refill   Accu-Chek Softclix Lancets lancets Use to check blood sugar three times daily. 100 each 2   acetaminophen (TYLENOL) 500 MG tablet Take 1,000 mg by mouth in the morning and at bedtime.      Blood Glucose Monitoring Suppl (ACCU-CHEK GUIDE) w/Device KIT Use to check blood sugar three times daily. 1 kit 0   Blood Glucose Monitoring Suppl (ACCU-CHEK GUIDE) w/Device KIT Use to check blood sugar three times daily. 1 kit 0   cyclobenzaprine (FLEXERIL) 10 MG tablet Take 1 tablet (10 mg  total) by mouth 2 (two) times daily as needed for muscle spasms. 60 tablet 0   ferrous sulfate (FEROSUL) 325 (65 FE) MG tablet Take 1 tablet (325 mg total) by mouth 2 (two) times daily with a meal. 180 tablet 0   fluticasone (FLONASE) 50 MCG/ACT nasal spray Place 1 spray into both nostrils daily as needed for allergies or rhinitis. 16 g 0   glipiZIDE (GLUCOTROL) 5 MG tablet Take 1 tablet (5 mg total) in the morning AND 1 tablet (5 mg total) every evening. 180 tablet 0   glucose blood (ACCU-CHEK GUIDE) test strip Use to check blood sugar three times daily. 100 each 3   glucose blood (ACCU-CHEK GUIDE) test strip Use to check blood sugar three times daily. 100 each 6   lisinopril (ZESTRIL) 20 MG tablet Take 1 tablet (20 mg total) by mouth in the morning and at bedtime. 180 tablet 0   loratadine (CLARITIN) 10 MG tablet Take 10 mg by mouth daily.     Magnesium Oxide 200 MG TABS Take 200 mg by mouth in the morning and at bedtime.     ondansetron (ZOFRAN) 8 MG tablet Take 1 tablet (8 mg total) by mouth daily as needed for nausea or vomiting. 30 tablet 2   pantoprazole (PROTONIX) 40 MG tablet Take 1 tablet (40 mg total) by mouth 2 (two) times daily before a meal. 60 tablet 1   Potassium 99 MG  TABS Take 99 mg by mouth daily at 6 (six) AM.     rOPINIRole (REQUIP) 1 MG tablet Take 1 tablet (1 mg total) by mouth at bedtime. 90 tablet 1   sildenafil (VIAGRA) 50 MG tablet Take 1 tablet (50 mg total) by mouth 1/2 hr before intercourse.  Limit use to 1 tab/24 hr 10 tablet 3   sucralfate (CARAFATE) 1 GM/10ML suspension Take 10 mLs (1 g total) by mouth every 8 (eight) hours. 420 mL 0   traMADol (ULTRAM) 50 MG tablet Take 1 tablet (50 mg total) by mouth every 8 (eight) hours as needed 90 tablet 2   vitamin B-12 (CYANOCOBALAMIN) 1000 MCG tablet Take 1,000 mcg by mouth daily.     lidocaine (LIDODERM) 5 % Place 1 patch onto the skin daily. Remove & Discard patch within 12 hours or as directed by MD (Patient not taking:  Reported on 04/02/2023) 30 patch 0   No facility-administered medications prior to visit.    PAST MEDICAL HISTORY: Past Medical History:  Diagnosis Date   Diabetes mellitus without complication (HCC)    GERD (gastroesophageal reflux disease)    Hyperlipidemia    Hypertension    IDA (iron deficiency anemia)    Rash of entire body 03/2016   Sleep apnea     PAST SURGICAL HISTORY: Past Surgical History:  Procedure Laterality Date   BACK SURGERY  1993   lumbar   BIOPSY  02/07/2019   Procedure: BIOPSY;  Surgeon: West Bali, MD;  Location: [facility];  Service: Endoscopy;;   BIOPSY  01/30/2022   Procedure: BIOPSY;  Surgeon: Lanelle Bal, DO;  Location: [facility];  Service: Endoscopy;;   CERVICAL SPINE SURGERY  2000   COLONOSCOPY N/A 02/07/2019   Dr. Darrick Penna: External hemorrhoids next colonoscopy in 10 years   ESOPHAGOGASTRODUODENOSCOPY N/A 02/07/2019   Dr. Darrick Penna: Barrett's esophagus without dysplasia chronic inactive gastritis but no H. 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, nonbleeding duodenal diverticulum,   ESOPHAGOGASTRODUODENOSCOPY N/A 01/05/2020   Procedure: ESOPHAGOGASTRODUODENOSCOPY (EGD);  Surgeon: West Bali, MD;  Location: [facility];  Service: Endoscopy;  Laterality: N/A;  10:30am   ESOPHAGOGASTRODUODENOSCOPY (EGD) WITH PROPOFOL N/A 12/06/2020   Procedure: ESOPHAGOGASTRODUODENOSCOPY (EGD) WITH PROPOFOL;  Surgeon: Lanelle Bal, DO;  [facility]: [facility];  Service: Endoscopy;  Laterality: N/A;  11:45am   ESOPHAGOGASTRODUODENOSCOPY (EGD) WITH PROPOFOL N/A 12/22/2021   long-segment Barrett's s/p biopsy, gastric bypass with normal sized puch and intact staple line. GJ anastomosis with healthy mucosa. Gastritis.   ESOPHAGOGASTRODUODENOSCOPY (EGD) WITH PROPOFOL N/A 01/30/2022   Procedure: ESOPHAGOGASTRODUODENOSCOPY (EGD) WITH PROPOFOL;  Surgeon: Lanelle Bal, DO;  [facility];  Service: Endoscopy;  Laterality: N/A;  11:00am   ESOPHAGOGASTRODUODENOSCOPY (EGD) WITH PROPOFOL N/A 03/11/2023   Procedure: ESOPHAGOGASTRODUODENOSCOPY (EGD) WITH PROPOFOL;  Surgeon: Benancio Deeds, MD;  Location: [facility];  Service: Gastroenterology;  Laterality: N/A;   GASTROJEJUNOSTOMY N/A 01/03/2021   Procedure: GASTROJEJUNOSTOMY;  Surgeon: [name], MD;  Location: [facility];  Service: General;  Laterality: N/A;   HAND SURGERY     SAVORY DILATION N/A 01/05/2020   Procedure: SAVORY DILATION;  Surgeon: West Bali, MD;  Location: [facility];  Service: Endoscopy;  Laterality: N/A;    FAMILY HISTORY: Family History  Problem Relation Age of Onset   Breast cancer Mother    Pancreatic cancer Mother    CAD Father    Colon cancer  Paternal Grandfather     SOCIAL HISTORY: Social History   Socioeconomic History   Marital status: Significant Other    Spouse name: Not on file   Number of children: Not on file   Years of education: Not on file   Highest education level: Not on file  Occupational History   Occupation: general contractor  Tobacco Use   Smoking status: Former    Types: Cigarettes    Quit date: 10/16/1988    Years since quitting: 34.4   Smokeless tobacco: Never  Vaping Use   Vaping Use: Never used  Substance and Sexual Activity   Alcohol use: No   Drug use: Yes    Comment: rare   Sexual activity: Not on file  Other Topics Concern   Not on file  Social History Narrative   Volunteers as IT sales professional   Social Determinants of Health   Financial Resource Strain: Not on file  Food Insecurity: No Food Insecurity (03/10/2023)   Hunger Vital Sign    Worried About Running Out of Food in the Last Year: Never true    Ran Out of Food in the Last Year: Never true  Transportation Needs: No Transportation Needs (03/10/2023)   PRAPARE - Administrator, Civil Service (Medical): No    Lack of Transportation (Non-Medical): No  Physical Activity:  Not on file  Stress: Not on file  Social Connections: Not on file  Intimate Partner Violence: Not At Risk (03/10/2023)   Humiliation, Afraid, Rape, and Kick questionnaire    Fear of Current or Ex-Partner: No    Emotionally Abused: No    Physically Abused: No    Sexually Abused: No     PHYSICAL EXAM  GENERAL EXAM/CONSTITUTIONAL: Vitals:  Vitals:   04/02/23 0937  BP: (!) 118/54  Pulse: 73  Weight: 197 lb 9.6 oz (89.6 kg)  Height: 5\' 10"  (1.778 m)   Body mass index is 28.35 kg/m. Wt Readings from Last 3 Encounters:  04/02/23 197 lb 9.6 oz (89.6 kg)  03/14/23 201 lb 8 oz (91.4 kg)  03/10/23 199 lb 11.8 oz (90.6 kg)   Patient is in no distress; well developed, nourished and groomed; neck is supple  CARDIOVASCULAR: Examination of carotid arteries is normal; no carotid bruits Regular rate and rhythm, no murmurs Examination of peripheral vascular system by observation and palpation is normal  EYES: Ophthalmoscopic exam of optic discs and posterior segments is normal; no papilledema or hemorrhages No results found.  MUSCULOSKELETAL: Gait, strength, tone, movements noted in Neurologic exam below  NEUROLOGIC: MENTAL STATUS:      No data to display         awake, alert, oriented to person, place and time recent and remote memory intact normal attention and concentration language fluent, comprehension intact, naming intact fund of knowledge appropriate  CRANIAL NERVE:  2nd - no papilledema on fundoscopic exam 2nd, 3rd, 4th, 6th - pupils equal and reactive to light, visual fields full to confrontation, extraocular muscles intact, no nystagmus 5th - facial sensation symmetric 7th - facial strength symmetric 8th - hearing intact 9th - palate elevates symmetrically, uvula midline 11th - shoulder shrug symmetric 12th - tongue protrusion midline SLIGHT MASKED FACIES  MOTOR:  normal bulk and tone, full strength in the BUE, BLE; EXCEPT LEFT DELTOID 3-4 (left  MILD  POSTURAL AND ACTION TREMOR IN BUE; NO RIGIDITY; NO BRADYKINESIA  SENSORY:  normal and symmetric to light touch, temperature, vibration  COORDINATION:  finger-nose-finger --> BILATERAL DYSMETRIA fine  finger movements normal  REFLEXES:  deep tendon reflexes 2+ in BUE; 2 at knees; 1 at ankles  GAIT/STATION:  narrow based gait; SLIGHTLY SLOW AND CAUTIOUS     DIAGNOSTIC DATA (LABS, IMAGING, TESTING) - I reviewed patient records, labs, notes, testing and imaging myself where available.  Lab Results  Component Value Date   WBC 4.1 03/14/2023   HGB 8.6 (L) 03/14/2023   HCT 26.9 (L) 03/14/2023   MCV 90.9 03/14/2023   PLT 236 03/14/2023      Component Value Date/Time   NA 132 (L) 03/12/2023 0740   NA 138 05/17/2021 1657   K 3.9 03/12/2023 0740   CL 103 03/12/2023 0740   CO2 23 03/12/2023 0740   GLUCOSE 103 (H) 03/12/2023 0740   BUN 11 03/12/2023 0740   BUN 11 05/17/2021 1657   CREATININE 0.95 03/12/2023 0740   CREATININE 0.65 12/14/2022 0832   CREATININE 0.72 10/17/2016 1012   CALCIUM 8.5 (L) 03/12/2023 0740   PROT 5.6 (L) 03/11/2023 0543   PROT 6.2 05/22/2022 1552   ALBUMIN 3.0 (L) 03/11/2023 0543   ALBUMIN 3.9 05/22/2022 1552   AST 18 03/11/2023 0543   AST 24 12/14/2022 0832   ALT 23 03/11/2023 0543   ALT 21 12/14/2022 0832   ALKPHOS 51 03/11/2023 0543   BILITOT 0.7 03/11/2023 0543   BILITOT 0.2 (L) 12/14/2022 0832   GFRNONAA >60 03/12/2023 0740   GFRNONAA >60 12/14/2022 0832   GFRAA 111 09/20/2020 1642   Lab Results  Component Value Date   CHOL 87 (L) 05/22/2022   HDL 34 (L) 05/22/2022   LDLCALC 39 05/22/2022   TRIG 64 05/22/2022   CHOLHDL 2.6 05/22/2022   Lab Results  Component Value Date   HGBA1C 4.6 (L) 03/11/2023   Lab Results  Component Value Date   VITAMINB12 2,048 (H) 03/10/2023   Lab Results  Component Value Date   TSH 1.970 03/18/2021       ASSESSMENT AND PLAN  62 y.o. year old male here with:   Dx:  1. Tremor   2. Ataxia    3. Gait abnormality      PLAN:  TREMOR, GAIT DIFF, ATAXIA (h/o cervical and lumbar spine surgeries)  - check MRI brain (rule out demyelinating disease, stroke, inflammation)  - if MRI negative, then could represent essential tremor --> hold off on meds for now given mild sxs; consider propranolol or primidone in future  Orders Placed This Encounter  Procedures   MR BRAIN W WO CONTRAST   Return for pending if symptoms worsen or fail to improve, pending test results.    Suanne Marker, MD 04/02/2023, 10:00 AM Certified in Neurology, Neurophysiology and Neuroimaging  [facility] [address] [place], [place] [address] [phone]

## 2023-04-02 NOTE — Patient Instructions (Addendum)
I would like to recheck your hemoglobin to ensure that this is improving.  You can have blood work completed at [facility].  Continue pantoprazole 40 mg twice daily 30 minutes before breakfast and dinner.  Be sure to take Carafate every 6 hours (4 times a day).  Continue to avoid all NSAID products including ibuprofen, Aleve, Advil, BC powders, Goody powders, and anything that says "NSAID" on the package.  We will schedule you for an upper endoscopy with Dr. Marletta Lor in the near future.  Start MiraLAX 1 capful (17 g) daily in 8 ounces of water or other noncarbonated beverage of your choice for constipation.  We will follow-up with you in the office after your procedure.  It was good to see you again today!  I am glad you are feeling better!  Ermalinda Memos, PA-C [facility]

## 2023-04-03 ENCOUNTER — Encounter: Payer: Self-pay | Admitting: *Deleted

## 2023-04-04 ENCOUNTER — Encounter: Payer: Self-pay | Admitting: Internal Medicine

## 2023-04-04 ENCOUNTER — Encounter: Payer: Self-pay | Admitting: Physician Assistant

## 2023-04-04 ENCOUNTER — Encounter: Payer: Self-pay | Admitting: Diagnostic Neuroimaging

## 2023-04-04 ENCOUNTER — Other Ambulatory Visit: Payer: Self-pay

## 2023-04-05 ENCOUNTER — Ambulatory Visit: Payer: Medicaid Other | Admitting: Sports Medicine

## 2023-04-06 ENCOUNTER — Other Ambulatory Visit: Payer: Self-pay

## 2023-04-07 ENCOUNTER — Telehealth: Payer: Self-pay | Admitting: Gastroenterology

## 2023-04-07 ENCOUNTER — Encounter: Payer: Self-pay | Admitting: Gastroenterology

## 2023-04-07 DIAGNOSIS — K9189 Other postprocedural complications and disorders of digestive system: Secondary | ICD-10-CM | POA: Insufficient documentation

## 2023-04-07 NOTE — Telephone Encounter (Signed)
Tammy, we are going to need to adjust diet instructions prior to EGD. 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

## 2023-04-09 ENCOUNTER — Telehealth: Payer: Self-pay | Admitting: Internal Medicine

## 2023-04-09 ENCOUNTER — Encounter: Payer: Self-pay | Admitting: *Deleted

## 2023-04-09 ENCOUNTER — Other Ambulatory Visit: Payer: Self-pay

## 2023-04-09 NOTE — Telephone Encounter (Signed)
Noted. Message sent to pt via MyChart.

## 2023-04-09 NOTE — Telephone Encounter (Signed)
Patient is calling regarding Rx #: [id number]  cyclobenzaprine (FLEXERIL) 10 MG tablet [[id number]] Advised not able to be refilled until 04/11/23. Chart indicates that medication was sent 03/28/23. Please advise CB- [phone]

## 2023-04-09 NOTE — Telephone Encounter (Signed)
Spoke with patient . Verified name & DOB    Advise that medication(FLEXERIL) 10 MG tablet is ready .

## 2023-04-10 ENCOUNTER — Ambulatory Visit
Admission: RE | Admit: 2023-04-10 | Discharge: 2023-04-10 | Disposition: A | Discharge status: Home / Self Care | Payer: Medicaid Other | Source: Ambulatory Visit | Attending: Diagnostic Neuroimaging | Admitting: Diagnostic Neuroimaging

## 2023-04-10 DIAGNOSIS — R270 Ataxia, unspecified: Secondary | ICD-10-CM | POA: Diagnosis not present

## 2023-04-10 DIAGNOSIS — R269 Unspecified abnormalities of gait and mobility: Secondary | ICD-10-CM

## 2023-04-10 DIAGNOSIS — R251 Tremor, unspecified: Secondary | ICD-10-CM | POA: Diagnosis not present

## 2023-04-10 MED ORDER — GADOPICLENOL 0.5 MMOL/ML IV SOLN
9.0000 mL | Freq: Once | INTRAVENOUS | Status: AC | PRN
  Administered 2023-04-10: 9 mL via INTRAVENOUS

## 2023-04-16 ENCOUNTER — Other Ambulatory Visit: Payer: Self-pay

## 2023-04-18 ENCOUNTER — Other Ambulatory Visit: Payer: Self-pay

## 2023-04-18 IMAGING — MG DIGITAL DIAGNOSTIC BILAT W/ TOMO W/ CAD
6 of 10 series · 6 of 30 positions shown · non-contrast
Comparison: None

ACR Breast Density Category a: The breast tissue is almost entirely
fatty.

CLINICAL DATA: Palpable area in the LEFT upper outer breast.

EXAM:
DIGITAL DIAGNOSTIC BILATERAL MAMMOGRAM WITH TOMOSYNTHESIS AND CAD;
ULTRASOUND LEFT BREAST LIMITED
TECHNIQUE: Bilateral digital diagnostic mammography and breast tomosynthesis
was performed. The images were evaluated with computer-aided
detection.; Targeted ultrasound examination of the left breast was
performed.

[R MLO synth-2D]
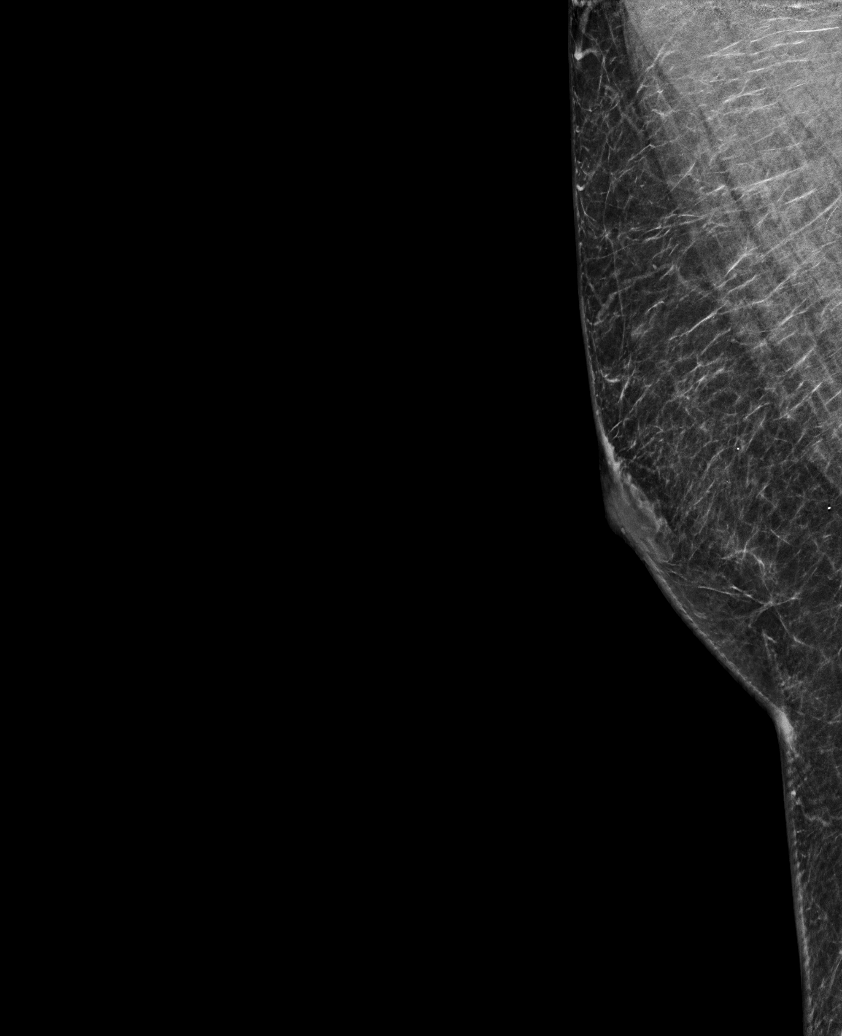

[L CC synth-2D]
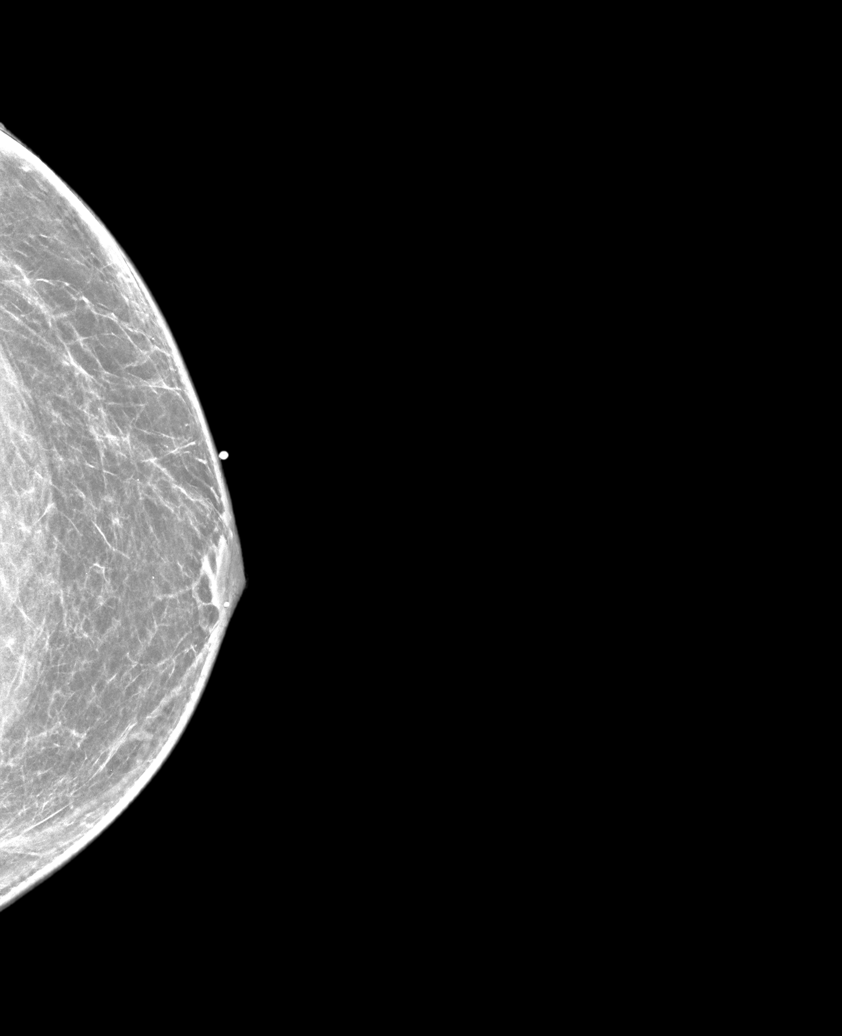

[R CC synth-2D]
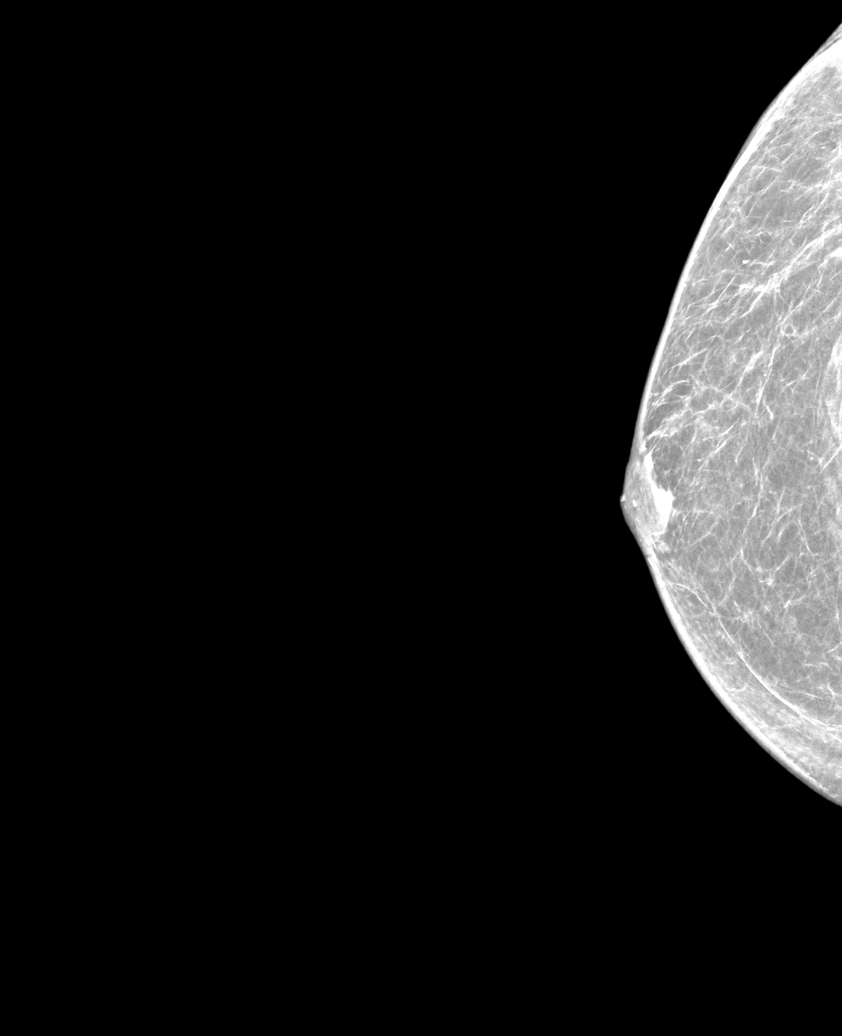

[L MLO synth-2D]
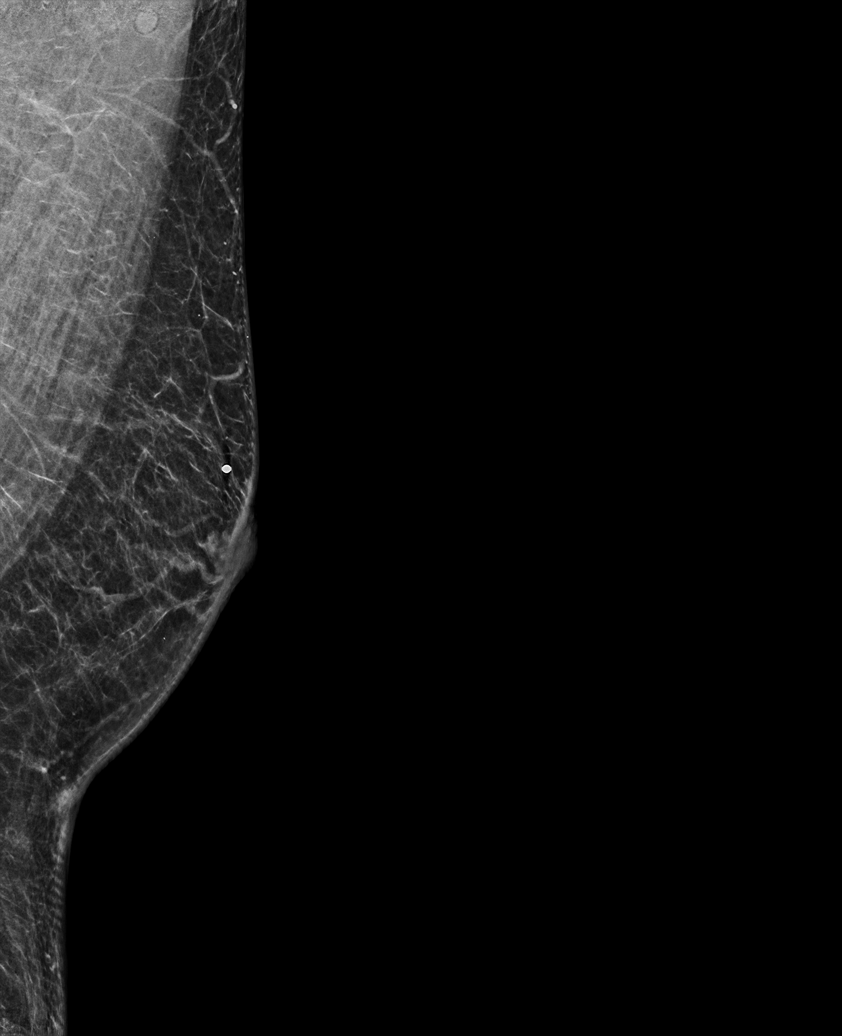

[L TAN synth-2D]
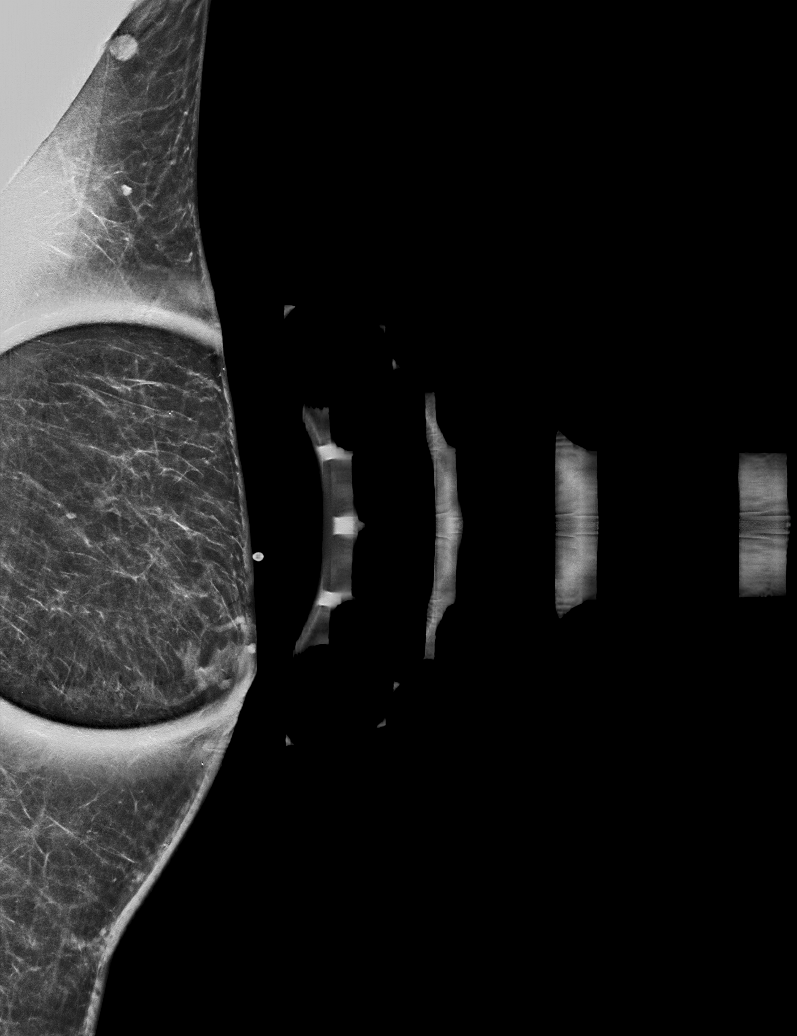

[L TAN tomo · tomo slice 30/59.0]
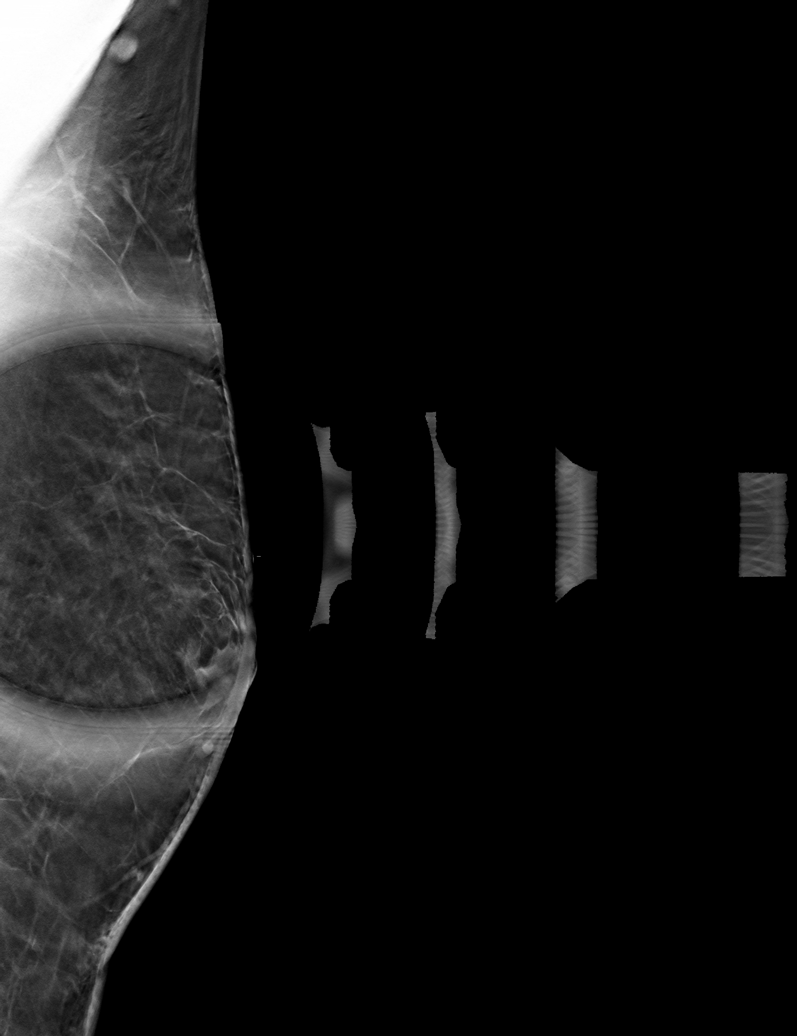

[6 of 30 positions shown; findings below may reference images not displayed]

FINDINGS: Spot compression tomosynthesis views were obtained over the palpable
area of concern in the LEFT breast. Density is noted subjacent to
the site of palpable concern. No suspicious mammographic finding is
identified in this area. No suspicious mass, microcalcification, or
other finding is identified in either breast.

On physical exam, no suspicious mass is appreciated.

Targeted LEFT breast ultrasound was performed in the palpable area
of concern at the upper outer breast. No suspicious solid or cystic
mass is identified.
IMPRESSION: 1. No mammographic or sonographic evidence of malignancy at the site
of palpable concern. Any further workup of the patient's symptoms
should be based on the clinical assessment.
2. No mammographic evidence of malignancy bilaterally.

RECOMMENDATION:
Any further workup of the patient's symptoms should be based on the
clinical assessment. No dedicated imaging is recommended.

I have discussed the findings and recommendations with the patient.
If applicable, a reminder letter will be sent to the patient
regarding the next appointment.

BI-RADS CATEGORY  1: Negative.

## 2023-04-20 NOTE — Patient Instructions (Signed)
Jeremy [name]  04/20/2023     @PREFPERIOPPHARMACY @   Your procedure is scheduled on  04/25/2023.   Report to [name] at  0825  A.M.   Call this number if you have problems the morning of surgery:  [phone]  If you experience any cold or flu symptoms such as cough, fever, chills, shortness of breath, etc. between now and your scheduled surgery, please notify Jeremy [name] at the above number.   Remember:  Follow the diet instructions given to you by the office.      DO NOT take any medications for diabetes the morning of your procedure.     Take these medicines the morning of surgery with A SIP OF WATER        flexeril(if needed), pantoprazole, tramadol (if needed).     Do not wear jewelry, make-up or nail polish, including gel polish,  artificial nails, or any other type of covering on natural nails (fingers and  toes).  Do not wear lotions, powders, or perfumes, or deodorant.  Do not shave 48 hours prior to surgery.  Men may shave face and neck.  Do not bring valuables to the hospital.  [name] is not responsible for any belongings or valuables.  Contacts, dentures or bridgework may not be worn into surgery.  Leave your suitcase in the car.  After surgery it may be brought to your room.  For patients admitted to the hospital, discharge time will be determined by your treatment team.  Patients discharged the day of surgery will not be allowed to drive home and must have someone with them for 24 hours.    Special instructions:   DO NOT smoke tobacco or vape for 24 hours before your procedure.  Please read over the following fact sheets that you were given. Anesthesia Post-op Instructions and Care and Recovery After Surgery      Upper Endoscopy, Adult, Care After After the procedure, it is common to have a sore throat. It is also common to have: Mild stomach pain or discomfort. Bloating. Nausea. Follow these instructions at home: The instructions  below may help you care for yourself at home. Your health care provider may give you more instructions. If you have questions, ask your health care provider. If you were given a sedative during the procedure, it can affect you for several hours. Do not drive or operate machinery until your health care provider says that it is safe. If you will be going home right after the procedure, plan to have a responsible adult: Take you home from the hospital or clinic. You will not be allowed to drive. Care for you for the time you are told. Follow instructions from your health care provider about what you may eat and drink. Return to your normal activities as told by your health care provider. Ask your health care provider what activities are safe for you. Take over-the-counter and prescription medicines only as told by your health care provider. Contact a health care provider if you: Have a sore throat that lasts longer than one day. Have trouble swallowing. Have a fever. Get help right away if you: Vomit blood or your vomit looks like coffee grounds. Have bloody, black, or tarry stools. Have a very bad sore throat or you cannot swallow. Have difficulty breathing or very bad pain in your chest or abdomen. These symptoms may be an emergency. Get help right away. Call 911. Do not wait to  see if the symptoms will go away. Do not drive yourself to the hospital. Summary After the procedure, it is common to have a sore throat, mild stomach discomfort, bloating, and nausea. If you were given a sedative during the procedure, it can affect you for several hours. Do not drive until your health care provider says that it is safe. Follow instructions from your health care provider about what you may eat and drink. Return to your normal activities as told by your health care provider. This information is not intended to replace advice given to you by your health care provider. Make sure you discuss any questions  you have with your health care provider. Document Revised: 01/11/2022 Document Reviewed: 01/11/2022 Elsevier Patient Education  2024 [employer] Esophageal Dilatation Esophageal dilatation, also called esophageal dilation, is a procedure to widen or open a blocked or narrowed part of the esophagus. The esophagus is the part of the body that moves food and liquid from the mouth to the stomach. You may need this procedure if: You have a buildup of scar tissue in your esophagus that makes it difficult, painful, or impossible to swallow. This can be caused by gastroesophageal reflux disease (GERD). You have cancer of the esophagus. There is a problem with how food moves through your esophagus. In some cases, you may need this procedure repeated at a later time to dilate the esophagus gradually. Tell a health care provider about: Any allergies you have. All medicines you are taking, including vitamins, herbs, eye drops, creams, and over-the-counter medicines. Any problems you or family members have had with anesthetic medicines. Any blood disorders you have. Any surgeries you have had. Any medical conditions you have. Any antibiotic medicines you are required to take before dental procedures. Whether you are pregnant or may be pregnant. What are the risks? Generally, this is a safe procedure. However, problems may occur, including: Bleeding due to a tear in the lining of the esophagus. A hole, or perforation, in the esophagus. What happens before the procedure? Ask your health care provider about: Changing or stopping your regular medicines. This is especially important if you are taking diabetes medicines or blood thinners. Taking medicines such as aspirin and ibuprofen. These medicines can thin your blood. Do not take these medicines unless your health care provider tells you to take them. Taking over-the-counter medicines, vitamins, herbs, and supplements. Follow instructions from your  health care provider about eating or drinking restrictions. Plan to have a responsible adult take you home from the hospital or clinic. Plan to have a responsible adult care for you for the time you are told after you leave the hospital or clinic. This is important. What happens during the procedure? You may be given a medicine to help you relax (sedative). A numbing medicine may be sprayed into the back of your throat, or you may gargle the medicine. Your health care provider may perform the dilatation using various surgical instruments, such as: Simple dilators. This instrument is carefully placed in the esophagus to stretch it. Guided wire bougies. This involves using an endoscope to insert a wire into the esophagus. A dilator is passed over this wire to enlarge the esophagus. Then the wire is removed. Balloon dilators. An endoscope with a small balloon is inserted into the esophagus. The balloon is inflated to stretch the esophagus and open it up. The procedure may vary among health care providers and hospitals. What can I expect after the procedure? Your blood pressure, heart rate, breathing  rate, and blood oxygen level will be monitored until you leave the hospital or clinic. Your throat may feel slightly sore and numb. This will get better over time. You will not be allowed to eat or drink until your throat is no longer numb. When you are able to drink, urinate, and sit on the edge of the bed without nausea or dizziness, you may be able to return home. Follow these instructions at home: Take over-the-counter and prescription medicines only as told by your health care provider. If you were given a sedative during the procedure, it can affect you for several hours. Do not drive or operate machinery until your health care provider says that it is safe. Plan to have a responsible adult care for you for the time you are told. This is important. Follow instructions from your health care provider  about any eating or drinking restrictions. Do not use any products that contain nicotine or tobacco, such as cigarettes, e-cigarettes, and chewing tobacco. If you need help quitting, ask your health care provider. Keep all follow-up visits. This is important. Contact a health care provider if: You have a fever. You have pain that is not relieved by medicine. Get help right away if: You have chest pain. You have trouble breathing. You have trouble swallowing. You vomit blood. You have black, tarry, or bloody stools. These symptoms may represent a serious problem that is an emergency. Do not wait to see if the symptoms will go away. Get medical help right away. Call your local emergency services (911 in the [place] Do not drive yourself to the hospital. Summary Esophageal dilatation, also called esophageal dilation, is a procedure to widen or open a blocked or narrowed part of the esophagus. Plan to have a responsible adult take you home from the hospital or clinic. For this procedure, a numbing medicine may be sprayed into the back of your throat, or you may gargle the medicine. Do not drive or operate machinery until your health care provider says that it is safe. This information is not intended to replace advice given to you by your health care provider. Make sure you discuss any questions you have with your health care provider. Document Revised: 02/18/2020 Document Reviewed: 02/18/2020 Elsevier Patient Education  2024 [employer] Inc. Monitored Anesthesia Care, Care After The following information offers guidance on how to care for yourself after your procedure. Your health care provider may also give you more specific instructions. If you have problems or questions, contact your health care provider. What can I expect after the procedure? After the procedure, it is common to have: Tiredness. Little or no memory about what happened during or after the procedure. Impaired judgment when it  comes to making decisions. Nausea or vomiting. Some trouble with balance. Follow these instructions at home: For the time period you were told by your health care provider:  Rest. Do not participate in activities where you could fall or become injured. Do not drive or use machinery. Do not drink alcohol. Do not take sleeping pills or medicines that cause drowsiness. Do not make important decisions or sign legal documents. Do not take care of children on your own. Medicines Take over-the-counter and prescription medicines only as told by your health care provider. If you were prescribed antibiotics, take them as told by your health care provider. Do not stop using the antibiotic even if you start to feel better. Eating and drinking Follow instructions from your health care provider about what you may  eat and drink. Drink enough fluid to keep your urine pale yellow. If you vomit: Drink clear fluids slowly and in small amounts as you are able. Clear fluids include water, ice chips, low-calorie sports drinks, and fruit juice that has water added to it (diluted fruit juice). Eat light and bland foods in small amounts as you are able. These foods include bananas, applesauce, rice, lean meats, toast, and crackers. General instructions  Have a responsible adult stay with you for the time you are told. It is important to have someone help care for you until you are awake and alert. If you have sleep apnea, surgery and some medicines can increase your risk for breathing problems. Follow instructions from your health care provider about wearing your sleep device: When you are sleeping. This includes during daytime naps. While taking prescription pain medicines, sleeping medicines, or medicines that make you drowsy. Do not use any products that contain nicotine or tobacco. These products include cigarettes, chewing tobacco, and vaping devices, such as e-cigarettes. If you need help quitting, ask your  health care provider. Contact a health care provider if: You feel nauseous or vomit every time you eat or drink. You feel light-headed. You are still sleepy or having trouble with balance after 24 hours. You get a rash. You have a fever. You have redness or swelling around the IV site. Get help right away if: You have trouble breathing. You have new confusion after you get home. These symptoms may be an emergency. Get help right away. Call 911. Do not wait to see if the symptoms will go away. Do not drive yourself to the hospital. This information is not intended to replace advice given to you by your health care provider. Make sure you discuss any questions you have with your health care provider. Document Revised: 02/27/2022 Document Reviewed: 02/27/2022 Elsevier Patient Education  2024 [employer].

## 2023-04-23 ENCOUNTER — Encounter (HOSPITAL_COMMUNITY): Payer: Self-pay

## 2023-04-23 ENCOUNTER — Encounter (HOSPITAL_COMMUNITY)
Admission: RE | Admit: 2023-04-23 | Discharge: 2023-04-23 | Disposition: A | Discharge status: Home / Self Care | Payer: Medicaid Other | Source: Ambulatory Visit | Attending: Internal Medicine | Admitting: Internal Medicine

## 2023-04-23 VITALS — BP 119/67 | HR 64 | Temp 98.2°F | Resp 18 | Ht 70.0 in | Wt 198.2 lb

## 2023-04-23 DIAGNOSIS — K922 Gastrointestinal hemorrhage, unspecified: Secondary | ICD-10-CM | POA: Insufficient documentation

## 2023-04-23 DIAGNOSIS — Z01812 Encounter for preprocedural laboratory examination: Secondary | ICD-10-CM | POA: Diagnosis not present

## 2023-04-23 DIAGNOSIS — D509 Iron deficiency anemia, unspecified: Secondary | ICD-10-CM | POA: Insufficient documentation

## 2023-04-23 DIAGNOSIS — K920 Hematemesis: Secondary | ICD-10-CM | POA: Diagnosis not present

## 2023-04-23 DIAGNOSIS — E119 Type 2 diabetes mellitus without complications: Secondary | ICD-10-CM

## 2023-04-23 DIAGNOSIS — I152 Hypertension secondary to endocrine disorders: Secondary | ICD-10-CM

## 2023-04-23 HISTORY — DX: Cardiac murmur, unspecified: R01.1

## 2023-04-23 LAB — CBC WITH DIFFERENTIAL/PLATELET
Abs Immature Granulocytes: 0.04 10*3/uL (ref 0.00–0.07)
Basophils Absolute: 0 10*3/uL (ref 0.0–0.1)
Basophils Relative: 1 %
Eosinophils Absolute: 0.2 10*3/uL (ref 0.0–0.5)
Eosinophils Relative: 4 %
HCT: 38.6 % — ABNORMAL LOW (ref 39.0–52.0)
Hemoglobin: 12.2 g/dL — ABNORMAL LOW (ref 13.0–17.0)
Immature Granulocytes: 1 %
Lymphocytes Relative: 23 %
Lymphs Abs: 1.2 10*3/uL (ref 0.7–4.0)
MCH: 29.8 pg (ref 26.0–34.0)
MCHC: 31.6 g/dL (ref 30.0–36.0)
MCV: 94.4 fL (ref 80.0–100.0)
Monocytes Absolute: 0.6 10*3/uL (ref 0.1–1.0)
Monocytes Relative: 11 %
Neutro Abs: 3.1 10*3/uL (ref 1.7–7.7)
Neutrophils Relative %: 60 %
Platelets: 201 10*3/uL (ref 150–400)
RBC: 4.09 MIL/uL — ABNORMAL LOW (ref 4.22–5.81)
RDW: 14 % (ref 11.5–15.5)
WBC: 5.1 10*3/uL (ref 4.0–10.5)
nRBC: 0 % (ref 0.0–0.2)

## 2023-04-23 LAB — BASIC METABOLIC PANEL
Anion gap: 9 (ref 5–15)
BUN: 13 mg/dL (ref 6–20)
CO2: 24 mmol/L (ref 22–32)
Calcium: 9.2 mg/dL (ref 8.9–10.3)
Chloride: 101 mmol/L (ref 98–111)
Creatinine, Ser: 0.8 mg/dL (ref 0.61–1.24)
GFR, Estimated: >60 mL/min (ref >60)
Glucose, Bld: 104 mg/dL — ABNORMAL HIGH (ref 70–99)
Potassium: 3.7 mmol/L (ref 3.5–5.1)
Sodium: 134 mmol/L — ABNORMAL LOW (ref 135–145)

## 2023-04-25 ENCOUNTER — Encounter: Payer: Self-pay | Admitting: Internal Medicine

## 2023-04-25 ENCOUNTER — Ambulatory Visit (HOSPITAL_COMMUNITY): Payer: Medicaid Other | Admitting: Certified Registered Nurse Anesthetist

## 2023-04-25 ENCOUNTER — Encounter (HOSPITAL_COMMUNITY)
Admission: RE | Disposition: A | Discharge status: Home / Self Care | Payer: Self-pay | Source: Home / Self Care | Attending: Internal Medicine

## 2023-04-25 ENCOUNTER — Ambulatory Visit (HOSPITAL_COMMUNITY)
Admission: RE | Admit: 2023-04-25 | Discharge: 2023-04-25 | Disposition: A | Discharge status: Home / Self Care | Payer: Medicaid Other | Attending: Internal Medicine | Admitting: Internal Medicine

## 2023-04-25 ENCOUNTER — Ambulatory Visit (HOSPITAL_BASED_OUTPATIENT_CLINIC_OR_DEPARTMENT_OTHER): Payer: Medicaid Other | Admitting: Certified Registered Nurse Anesthetist

## 2023-04-25 DIAGNOSIS — Z980 Intestinal bypass and anastomosis status: Secondary | ICD-10-CM | POA: Diagnosis not present

## 2023-04-25 DIAGNOSIS — I25110 Atherosclerotic heart disease of native coronary artery with unstable angina pectoris: Secondary | ICD-10-CM

## 2023-04-25 DIAGNOSIS — Z6828 Body mass index (BMI) 28.0-28.9, adult: Secondary | ICD-10-CM | POA: Diagnosis not present

## 2023-04-25 DIAGNOSIS — Z9884 Bariatric surgery status: Secondary | ICD-10-CM | POA: Diagnosis not present

## 2023-04-25 DIAGNOSIS — K2950 Unspecified chronic gastritis without bleeding: Secondary | ICD-10-CM | POA: Diagnosis not present

## 2023-04-25 DIAGNOSIS — Z87891 Personal history of nicotine dependence: Secondary | ICD-10-CM | POA: Insufficient documentation

## 2023-04-25 DIAGNOSIS — Z7984 Long term (current) use of oral hypoglycemic drugs: Secondary | ICD-10-CM | POA: Diagnosis not present

## 2023-04-25 DIAGNOSIS — K2270 Barrett's esophagus without dysplasia: Secondary | ICD-10-CM | POA: Insufficient documentation

## 2023-04-25 DIAGNOSIS — R634 Abnormal weight loss: Secondary | ICD-10-CM | POA: Diagnosis not present

## 2023-04-25 DIAGNOSIS — G4730 Sleep apnea, unspecified: Secondary | ICD-10-CM | POA: Insufficient documentation

## 2023-04-25 DIAGNOSIS — Z9889 Other specified postprocedural states: Secondary | ICD-10-CM | POA: Insufficient documentation

## 2023-04-25 DIAGNOSIS — Z79899 Other long term (current) drug therapy: Secondary | ICD-10-CM | POA: Diagnosis not present

## 2023-04-25 DIAGNOSIS — K222 Esophageal obstruction: Secondary | ICD-10-CM | POA: Diagnosis not present

## 2023-04-25 DIAGNOSIS — K319 Disease of stomach and duodenum, unspecified: Secondary | ICD-10-CM | POA: Diagnosis not present

## 2023-04-25 DIAGNOSIS — D649 Anemia, unspecified: Secondary | ICD-10-CM

## 2023-04-25 DIAGNOSIS — I2510 Atherosclerotic heart disease of native coronary artery without angina pectoris: Secondary | ICD-10-CM | POA: Insufficient documentation

## 2023-04-25 DIAGNOSIS — K279 Peptic ulcer, site unspecified, unspecified as acute or chronic, without hemorrhage or perforation: Secondary | ICD-10-CM

## 2023-04-25 DIAGNOSIS — Z09 Encounter for follow-up examination after completed treatment for conditions other than malignant neoplasm: Secondary | ICD-10-CM | POA: Insufficient documentation

## 2023-04-25 DIAGNOSIS — E119 Type 2 diabetes mellitus without complications: Secondary | ICD-10-CM | POA: Diagnosis not present

## 2023-04-25 DIAGNOSIS — I10 Essential (primary) hypertension: Secondary | ICD-10-CM | POA: Insufficient documentation

## 2023-04-25 DIAGNOSIS — K2100 Gastro-esophageal reflux disease with esophagitis, without bleeding: Secondary | ICD-10-CM | POA: Diagnosis not present

## 2023-04-25 DIAGNOSIS — K315 Obstruction of duodenum: Secondary | ICD-10-CM | POA: Insufficient documentation

## 2023-04-25 HISTORY — PX: BIOPSY: SHX5522

## 2023-04-25 HISTORY — PX: ESOPHAGOGASTRODUODENOSCOPY (EGD) WITH PROPOFOL: SHX5813

## 2023-04-25 HISTORY — PX: BALLOON DILATION: SHX5330

## 2023-04-25 LAB — GLUCOSE, CAPILLARY
Glucose-Capillary: 81 mg/dL (ref 70–99)
Glucose-Capillary: 94 mg/dL (ref 70–99)

## 2023-04-25 SURGERY — ESOPHAGOGASTRODUODENOSCOPY (EGD) WITH PROPOFOL
Anesthesia: General

## 2023-04-25 MED ORDER — LIDOCAINE HCL (PF) 2 % IJ SOLN
INTRAMUSCULAR | Status: AC
  Filled 2023-04-25: qty 5

## 2023-04-25 MED ORDER — PROPOFOL 500 MG/50ML IV EMUL
INTRAVENOUS | Status: DC | PRN
  Administered 2023-04-25: 180 ug/kg/min via INTRAVENOUS

## 2023-04-25 MED ORDER — LACTATED RINGERS IV SOLN: INTRAVENOUS | Status: DC

## 2023-04-25 MED ORDER — PROPOFOL 10 MG/ML IV BOLUS
INTRAVENOUS | Status: DC | PRN
  Administered 2023-04-25: 80 mg via INTRAVENOUS

## 2023-04-25 MED ORDER — EPHEDRINE 5 MG/ML INJ
INTRAVENOUS | Status: AC
  Filled 2023-04-25: qty 5

## 2023-04-25 MED ORDER — EPHEDRINE SULFATE-NACL 50-0.9 MG/10ML-% IV SOSY
PREFILLED_SYRINGE | INTRAVENOUS | Status: DC | PRN
  Administered 2023-04-25 (×10): 5 mg via INTRAVENOUS

## 2023-04-25 MED ORDER — PROPOFOL 500 MG/50ML IV EMUL
INTRAVENOUS | Status: AC
  Filled 2023-04-25: qty 50

## 2023-04-25 MED ORDER — LIDOCAINE HCL (PF) 2 % IJ SOLN
INTRAMUSCULAR | Status: DC | PRN
  Administered 2023-04-25: 50 mg via INTRADERMAL

## 2023-04-25 NOTE — Op Note (Addendum)
[facility] Patient Name: Jeremy [name] Procedure Date: 04/25/2023 9:09 AM MRN: [id number] Date of Birth: 1961/01/22 Attending MD: Hennie Duos. Marletta Lor , [place], [id number] CSN: [id number] Age: 62 Admit Type: Outpatient Procedure:                Upper GI endoscopy Indications:              Follow-up of peptic ulcer, Weight loss Providers:                Hennie Duos. Marletta Lor, DO, Nena Polio, RN, Zena Amos Referring MD:              Medicines:                See the Anesthesia note for documentation of the                            administered medications Complications:            No immediate complications. Estimated Blood Loss:     Estimated blood loss was minimal. Procedure:                Pre-Anesthesia Assessment:                           - The anesthesia plan was to use monitored                            anesthesia care (MAC).                           After obtaining informed consent, the endoscope was                            passed under direct vision. Throughout the                            procedure, the patient's blood pressure, pulse, and                            oxygen saturations were monitored continuously. The                            GIF-H190 ([id number]) scope was introduced through the                            mouth, and advanced to the efferent jejunal loop.                            The upper GI endoscopy was accomplished without                            difficulty. The patient tolerated the procedure                            well. Scope In:  9:27:29 AM Scope Out: 10:07:05 AM Total Procedure Duration: 0 hours 39 minutes 36 seconds  Findings:      There were esophageal mucosal changes secondary to established       long-segment Barrett's disease present in the lower third of the       esophagus. The maximum longitudinal extent of these mucosal changes was       10 cm in length. Elected not to rebiopsy today as patient  not due for       surveillance biopsies at this time. Previously noted esophagitis has       healed.      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 This was       characterized by healthy appearing mucosa. Perhaps this was not       visualized on recent exam due to large amount of food debris.       Anastamosis was widely patent. No strictures or ulcerations seen. See       pictures. Examined jejunum normal.      An acquired benign-appearing, intrinsic severe stenosis was found in the       first portion of the duodenum as seen prior. Unable to be traversed with       endosocpe. I switched to "Ultra slim" endoscope and was able to traverse       stricture. Examined mucosa distal appeared healthy. I then switched back       to regular endoscope. A TTS dilator was passed through the scope       throught the stricture. Dilation with a 07-27-11 mm balloon dilator was       performed up to 12 mm. The dilation site was examined and showed mild       mucosal disruption. Estimated blood loss was minimal. Previously noted       ulcer in the duodenal bulb has healed.      Diffuse moderate inflammation characterized by erosions and erythema was       found in the entire examined stomach. Biopsies were taken with a cold       forceps for Helicobacter pylori testing. Impression:               - Esophageal mucosal changes secondary to                            established long-segment Barrett's disease.                           - A gastrojejunostomy was found, characterized by                            healthy appearing mucosa.                           - Acquired duodenal stenosis. Dilated.                           - Gastritis. Biopsied. Moderate Sedation:      Per Anesthesia Care Recommendation:           - Patient has a contact number available for  emergencies. The signs and symptoms of potential                            delayed complications  were discussed with the                            patient. Return to normal activities tomorrow.                            Written discharge instructions were provided to the                            patient.                           - Resume previous diet.                           - Continue present medications.                           - Await pathology results.                           - Repeat upper endoscopy PRN for retreatment.                           - Return to GI clinic in 6 weeks.                           - Use a proton pump inhibitor PO BID.                           - No ibuprofen, naproxen, or other non-steroidal                            anti-inflammatory drugs. Procedure Code(s):        --- Professional ---                           [phone], Esophagogastroduodenoscopy, flexible,                            transoral; with dilation of gastric/duodenal                            stricture(s) (eg, balloon, bougie) Diagnosis Code(s):        --- Professional ---                           K22.70, Barrett's esophagus without dysplasia                           Z98.0, Intestinal bypass and anastomosis status                           K31.5, Obstruction of duodenum  K27.9, Peptic ulcer, site unspecified, unspecified                            as acute or chronic, without hemorrhage or                            perforation                           R63.4, Abnormal weight loss CPT copyright 2022 American Medical Association. All rights reserved. The codes documented in this report are preliminary and upon coder review may  be revised to meet current compliance requirements. Hennie Duos. Marletta Lor, DO Hennie Duos. Marletta Lor, DO 04/25/2023 10:25:53 AM This report has been signed electronically. Number of Addenda: 0

## 2023-04-25 NOTE — Anesthesia Postprocedure Evaluation (Signed)
Anesthesia Post Note  Patient: HUSTON STONEHOCKER  Procedure(s) Performed: ESOPHAGOGASTRODUODENOSCOPY (EGD) WITH PROPOFOL BALLOON DILATION BIOPSY  Patient location during evaluation: Phase II Anesthesia Type: General Level of consciousness: awake and alert and oriented Pain management: pain level controlled Vital Signs Assessment: post-procedure vital signs reviewed and stable Respiratory status: spontaneous breathing, nonlabored ventilation and respiratory function stable Cardiovascular status: blood pressure returned to baseline and stable Postop Assessment: no apparent nausea or vomiting Anesthetic complications: no  No notable events documented.   Last Vitals:  Vitals:   04/25/23 1012 04/25/23 1030  BP: (!) 91/40 (!) 98/58  Pulse: 77 72  Resp: 19   Temp: (!) 36.4 C   SpO2: 97% 100%    Last Pain:  Vitals:   04/25/23 1012  PainSc: Asleep                 Pennye Beeghly C Elen Acero

## 2023-04-25 NOTE — Anesthesia Preprocedure Evaluation (Addendum)
Anesthesia Evaluation  Patient identified by MRN, date of birth, ID band Patient awake    Reviewed: Allergy & Precautions, NPO status , Patient's Chart, lab work & pertinent test results  Airway Mallampati: II  TM Distance: <3 FB Neck ROM: Full    Dental  (+) Dental Advisory Given, Missing, Poor Dentition, Chipped, Loose,    Pulmonary sleep apnea , Patient abstained from smoking., former smoker   Pulmonary exam normal        Cardiovascular hypertension, + angina  + CAD  Normal cardiovascular exam+ Valvular Problems/Murmurs  Rhythm:Regular Rate:Normal     Neuro/Psych  Neuromuscular disease  negative psych ROS   GI/Hepatic Neg liver ROS, PUD,GERD  Medicated and Controlled,,  Endo/Other  diabetes, Well Controlled, Type 2, Oral Hypoglycemic Agents    Renal/GU   negative genitourinary   Musculoskeletal  (+) Arthritis ,    Abdominal Normal abdominal exam  (+)  Abdomen: soft. Bowel sounds: normal.  Peds  Hematology  (+) Blood dyscrasia   Anesthesia Other Findings   Reproductive/Obstetrics negative OB ROS                             Anesthesia Physical Anesthesia Plan  ASA: 3  Anesthesia Plan: General   Post-op Pain Management: Minimal or no pain anticipated   Induction: Intravenous  PONV Risk Score and Plan: 0 and Propofol infusion and TIVA  Airway Management Planned: Nasal Cannula  Additional Equipment:   Intra-op Plan:   Post-operative Plan:   Informed Consent: I have reviewed the patients History and Physical, chart, labs and discussed the procedure including the risks, benefits and alternatives for the proposed anesthesia with the patient or authorized representative who has indicated his/her understanding and acceptance.     Dental advisory given  Plan Discussed with: Anesthesiologist  Anesthesia Plan Comments:        Anesthesia Quick Evaluation

## 2023-04-25 NOTE — Transfer of Care (Signed)
Immediate Anesthesia Transfer of Care Note  Patient: Jeremy [name]  Procedure(s) Performed: ESOPHAGOGASTRODUODENOSCOPY (EGD) WITH PROPOFOL BALLOON DILATION BIOPSY  Patient Location: Short Stay  Anesthesia Type:General  Level of Consciousness: drowsy, patient cooperative, and responds to stimulation  Airway & Oxygen Therapy: Patient Spontanous Breathing  Post-op Assessment: Report given to RN, Post -op Vital signs reviewed and stable, Patient moving all extremities X 4, and Patient able to stick tongue midline  Post vital signs: Reviewed  Last Vitals:  Vitals Value Taken Time  BP 97/50   Temp 97.6   Pulse 72   Resp 22   SpO2 95     Last Pain:  Vitals:   04/25/23 0912  PainSc: 0-No pain         Complications: No notable events documented.

## 2023-04-25 NOTE — Discharge Instructions (Signed)
EGD Discharge instructions Please read the instructions outlined below and refer to this sheet in the next few weeks. These discharge instructions provide you with general information on caring for yourself after you leave the hospital. Your doctor may also give you specific instructions. While your treatment has been planned according to the most current medical practices available, unavoidable complications occasionally occur. If you have any problems or questions after discharge, please call your doctor. ACTIVITY You may resume your regular activity but move at a slower pace for the next 24 hours.  Take frequent rest periods for the next 24 hours.  Walking will help expel (get rid of) the air and reduce the bloated feeling in your abdomen.  No driving for 24 hours (because of the anesthesia (medicine) used during the test).  You may shower.  Do not sign any important legal documents or operate any machinery for 24 hours (because of the anesthesia used during the test).  NUTRITION Drink plenty of fluids.  You may resume your normal diet.  Begin with a light meal and progress to your normal diet.  Avoid alcoholic beverages for 24 hours or as instructed by your caregiver.  MEDICATIONS You may resume your normal medications unless your caregiver tells you otherwise.  WHAT YOU CAN EXPECT TODAY You may experience abdominal discomfort such as a feeling of fullness or "gas" pains.  FOLLOW-UP Your doctor will discuss the results of your test with you.  SEEK IMMEDIATE MEDICAL ATTENTION IF ANY OF THE FOLLOWING OCCUR: Excessive nausea (feeling sick to your stomach) and/or vomiting.  Severe abdominal pain and distention (swelling).  Trouble swallowing.  Temperature over 101 F (37.8 C).  Rectal bleeding or vomiting of blood.   Your upper endoscopy showed a widely patent surgical anastomosis without obstruction or evidence of ulceration.  Your duodenum which showed a large ulcer prior has healed.   You still have the severe intrinsic stricture in this region.  I did dilate this today in hopes that it may help your symptoms.  Also took biopsies of your stomach given significant inflammation rule out bacteria called H. pylori.  Continue on pantoprazole twice daily.  Absolutely avoid all NSAIDs.  Follow-up in GI clinic in 6 weeks.   I hope you have a great rest of your week!  Jeremy [name]. Marletta Lor, D.O. Gastroenterology and Hepatology [facility]

## 2023-04-28 NOTE — Interval H&P Note (Signed)
History and Physical Interval Note:  04/28/2023 10:39 AM  Jeremy [name]  has presented today for surgery, with the diagnosis of peptic ulcer disease,anemia.  The various methods of treatment have been discussed with the patient and family. After consideration of risks, benefits and other options for treatment, the patient has consented to  Procedure(s) with comments: ESOPHAGOGASTRODUODENOSCOPY (EGD) WITH PROPOFOL (N/A) - 10:30 am, [name] 3 BALLOON DILATION (N/A) BIOPSY as a surgical intervention.  The patient's history has been reviewed, patient examined, no change in status, stable for surgery.  I have reviewed the patient's chart and labs.  Questions were answered to the patient's satisfaction.     Lanelle Bal

## 2023-04-30 LAB — SURGICAL PATHOLOGY

## 2023-05-01 ENCOUNTER — Encounter (HOSPITAL_COMMUNITY): Payer: Self-pay | Admitting: Internal Medicine

## 2023-05-07 ENCOUNTER — Encounter: Payer: Self-pay | Admitting: Internal Medicine

## 2023-05-07 ENCOUNTER — Ambulatory Visit: Payer: Medicaid Other | Attending: Internal Medicine | Admitting: Internal Medicine

## 2023-05-07 ENCOUNTER — Other Ambulatory Visit: Payer: Self-pay

## 2023-05-07 VITALS — BP 123/66 | HR 63 | Temp 98.1°F | Ht 70.0 in | Wt 197.0 lb

## 2023-05-07 DIAGNOSIS — M25512 Pain in left shoulder: Secondary | ICD-10-CM | POA: Diagnosis not present

## 2023-05-07 DIAGNOSIS — H9311 Tinnitus, right ear: Secondary | ICD-10-CM

## 2023-05-07 DIAGNOSIS — M542 Cervicalgia: Secondary | ICD-10-CM | POA: Diagnosis not present

## 2023-05-07 DIAGNOSIS — G8929 Other chronic pain: Secondary | ICD-10-CM | POA: Diagnosis not present

## 2023-05-07 DIAGNOSIS — Z7984 Long term (current) use of oral hypoglycemic drugs: Secondary | ICD-10-CM | POA: Diagnosis not present

## 2023-05-07 DIAGNOSIS — Z23 Encounter for immunization: Secondary | ICD-10-CM | POA: Diagnosis not present

## 2023-05-07 DIAGNOSIS — M545 Low back pain, unspecified: Secondary | ICD-10-CM | POA: Diagnosis not present

## 2023-05-07 DIAGNOSIS — E119 Type 2 diabetes mellitus without complications: Secondary | ICD-10-CM

## 2023-05-07 DIAGNOSIS — E1159 Type 2 diabetes mellitus with other circulatory complications: Secondary | ICD-10-CM | POA: Diagnosis not present

## 2023-05-07 MED ORDER — SUCRALFATE 1 GM/10ML PO SUSP
1.0000 g | Freq: Three times a day (TID) | ORAL | 1 refills | Status: DC
  Filled 2023-05-07 – 2023-05-13 (×2): qty 420, 14d supply, fill #0
  Filled 2023-06-19: qty 420, 14d supply, fill #1

## 2023-05-07 NOTE — Progress Notes (Signed)
Patient ID: Jeremy [name], male    DOB: 1961-03-25  MRN: [id number]  CC: Shoulder Pain (Persistent L shoulder pain x3 mo radiating to neck & back - requesting poss referral to pain management/Requesting refill on sucralfate/R ear discomfort, itching X2 mo/Yes to shingles vax.)   Subjective: Jeremy [name] is a 61 y.o. male who presents for chronic ds management His concerns today include:  Pt with hx of DM, HL, HTN,GERD, IDA (Barrett's esophagus and moderate gastritis thought to be NSAID induced on EGD; iron infusions by Dr. 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  Myoview neg 02/2021    Shoulder Pain LT:  Visit 01/15/2023:  Complains of pain in the left shoulder x 1 month.  Does handyman jobs.  Pain started after he was hammering a piece of pipe under a sidewalk.  About 2 days later he had picked up a 5 gallon bottle of water and this made it worse.  Pain is worse with elevation of the arm to about 90 degrees.  He has been taking Advil up to 4 times a day. He has tramadol at home which has been prescribed for him for arthritis in the hip and chronic lower back pain due to spinal stenosis and DDD.  However he states that the tramadol has not controlled the pain in his shoulder sufficiently.  Patient was advised on that visit to stop Advil given that he has had Roux-en-Y procedure and known endoscopy proven gastritis.  Advised to use his tramadol.  Referred to orthopedics. Saw Dr. Roda Shutters.  Injection to the shoulder was ordered.  Did not have it done.  Scheduled for later this week. -Tries not to use the arm.  Pain is worse with elevation.  Radiates to elbow  Wants referral to Pain Management for chronic lower back, neck and knee pain.  He has known spinal stenosis, arthritis and disc disease in the lumbar spine.  Pain is on both sides of the lower back.  No  radiation or numbness.  Had lumbar laminectomy in 1992.  Reports stiffness in the lower back if not on leave. Pain in both knees; no swelling. Most bothersome when getting up or down off floor.  MRI of the right knee done 2022 showed partial-thickness cartilage loss of the knee joint. Neck pain both sides.  Constant to strong discomfort Also surgery neck with fusion in 2020.  Pain in neck worse at nights and after work during the day  DM  Lab Results  Component Value Date   HGBA1C 4.6 (L) 03/11/2023  He dec Glucotrol from 5 mg BID to o once a day in the evenings after hospitalization in May for GI bleed.  He has lost some weight since that hospitalization..   Given new stripes, glucometer, but does not like using the new glucometer because the lancets her are painful.  Itching RT ear:  "sounds like swimmer's ear, like scratchy sound, under water" off and on x 2 mths.   Hospitalized in May with GI bleed in setting of ongoing NSAID use.  EGD showed large ulcer at the anastomosis site of his Roux-en-Y procedure and Barrett's esophagus.  Has seen GI in follow-up since then and had repeat EGD which showed healing of the ulcer.  He is requesting refill on Carafate.   HM:  Due to shingles.  Has eye exam 05/2023.  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   Coffee ground emesis 03/11/2023   NSAID long-term use 03/11/2023   Anemia, posthemorrhagic, acute 03/11/2023   Upper GI bleeding 03/10/2023   Hypertension associated with type 2 diabetes mellitus (HCC) 01/15/2023   Coronary artery disease of native artery of native heart with stable angina pectoris (HCC) 01/15/2023   Constipation 10/25/2022   Upper abdominal pain 07/06/2022   Nausea and vomiting 07/06/2022   Hyponatremia 06/23/2021   Low serum  vitamin B12 03/23/2021   Vitamin D deficiency 03/23/2021   S/P bypass gastrojejunostomy 01/03/2021   Gastric outlet obstruction    Duodenal stricture    Primary osteoarthritis of both hips 11/15/2020   Aortic atherosclerosis (HCC) 08/30/2020   Coronary artery calcification 08/30/2020   Bilateral carpal tunnel syndrome 06/07/2020   Grief reaction 06/07/2020   Pyloric stenosis in adult 02/05/2020   Congenital hypertrophic pyloric stenosis    Duodenal web    Barrett's esophagus without dysplasia 05/19/2019   OSA (obstructive sleep apnea) 04/30/2019   Iron deficiency anemia 11/25/2018   Phimosis of penis 11/19/2018   Esophageal dysphagia 08/19/2018   Benign prostatic hyperplasia with post-void dribbling 01/31/2018   Absence of bladder continence 01/31/2018   Obesity (BMI 30-39.9) 11/05/2017   GERD (gastroesophageal reflux disease) 06/03/2017   Chronic pain of both knees 06/03/2017   Chronic lower back pain 06/03/2017   Type 2 diabetes mellitus without complication, without long-term current use of insulin (HCC) 04/12/2016   Essential hypertension 04/12/2016     Current Outpatient Medications on File Prior to Visit  Medication Sig Dispense Refill   Accu-Chek Softclix Lancets lancets Use to check blood sugar three times daily. 100 each 2   acetaminophen (TYLENOL) 500 MG tablet Take 1,000 mg by mouth in the morning and at bedtime.      ascorbic acid (VITAMIN C) 500 MG tablet Take 500 mg by mouth 2 (two) times daily.     Blood Glucose Monitoring Suppl (ACCU-CHEK GUIDE) w/Device KIT Use to check blood sugar three times daily. 1 kit 0   Blood Glucose Monitoring Suppl (ACCU-CHEK GUIDE) w/Device KIT Use to check blood sugar three times daily. 1 kit 0   Cyanocobalamin (VITAMIN B-12 PO) Take 2,500 mcg by mouth daily.     cyclobenzaprine (FLEXERIL) 10 MG tablet Take 1 tablet (10 mg total) by mouth 2 (two) times daily as needed for muscle spasms. (Patient taking differently: Take 10 mg by mouth 2  (two) times daily.) 60 tablet 0   ferrous sulfate (FEROSUL) 325 (65 FE) MG tablet Take 1 tablet (325 mg total) by mouth 2 (two) times daily with a meal. 180 tablet 0   fluticasone (FLONASE) 50 MCG/ACT nasal spray Place 1 spray into both nostrils daily as needed for allergies or rhinitis. 16 g 0   glipiZIDE (GLUCOTROL) 5 MG tablet Take 1 tablet (5 mg total) in the morning AND 1 tablet (5 mg total) every evening. (Patient taking differently: Take 1 tablet (5 mg total) in the morning depending on diet) 180 tablet 0   glucose blood (ACCU-CHEK GUIDE) test strip Use to check blood sugar three times daily. 100 each 3   glucose blood (ACCU-CHEK GUIDE) test strip Use to check blood sugar three times daily. 100 each 6   lisinopril (ZESTRIL) 20 MG tablet Take 1  tablet (20 mg total) by mouth in the morning and at bedtime. 180 tablet 0   loratadine (CLARITIN) 10 MG tablet Take 10 mg by mouth daily.     Magnesium Oxide 200 MG TABS Take 200 mg by mouth in the morning and at bedtime.     Multiple Vitamins-Minerals (MULTIVITAMIN WITH MINERALS) tablet Take 1 tablet by mouth 2 (two) times daily.     ondansetron (ZOFRAN) 8 MG tablet Take 1 tablet (8 mg total) by mouth daily as needed for nausea or vomiting. 30 tablet 2   pantoprazole (PROTONIX) 40 MG tablet Take 1 tablet (40 mg total) by mouth 2 (two) times daily before a meal. 60 tablet 1   Potassium 99 MG TABS Take 198 mg by mouth 2 (two) times daily.     rOPINIRole (REQUIP) 1 MG tablet Take 1 tablet (1 mg total) by mouth at bedtime. 90 tablet 1   sildenafil (VIAGRA) 50 MG tablet Take 1 tablet (50 mg total) by mouth 1/2 hr before intercourse.  Limit use to 1 tab/24 hr 10 tablet 3   traMADol (ULTRAM) 50 MG tablet Take 1 tablet (50 mg total) by mouth every 8 (eight) hours as needed (Patient taking differently: Take 50 mg by mouth 3 (three) times daily.) 90 tablet 2   sucralfate (CARAFATE) 1 GM/10ML suspension Take 10 mLs (1 g total) by mouth every 8 (eight) hours.  (Patient not taking: Reported on 05/07/2023) 420 mL 0   No current facility-administered medications on file prior to visit.    Allergies  Allergen Reactions   Cymbalta [Duloxetine Hcl] Other (See Comments)    Caused anorgasmia   Doxycycline     Unknown   Other Nausea And Vomiting    Mayonnaise mustard ketchup   Robaxin [Methocarbamol] Other (See Comments)    Had hiccups x 4 hours after taking    Social History   Socioeconomic History   Marital status: Significant Other    Spouse name: Not on file   Number of children: Not on file   Years of education: Not on file   Highest education level: Not on file  Occupational History   Occupation: Product/process development scientist  Tobacco Use   Smoking status: Former    Current packs/day: 0.00    Types: Cigarettes    Quit date: 10/16/1988    Years since quitting: 34.5   Smokeless tobacco: Never  Vaping Use   Vaping status: Never Used  Substance and Sexual Activity   Alcohol use: No   Drug use: Yes    Comment: rare   Sexual activity: Not on file  Other Topics Concern   Not on file  Social History Narrative   Volunteers as IT sales professional   Social Determinants of Health   Financial Resource Strain: Not on file  Food Insecurity: No Food Insecurity (03/10/2023)   Hunger Vital Sign    Worried About Running Out of Food in the Last Year: Never true    Ran Out of Food in the Last Year: Never true  Transportation Needs: No Transportation Needs (03/10/2023)   PRAPARE - Administrator, Civil Service (Medical): No    Lack of Transportation (Non-Medical): No  Physical Activity: Not on file  Stress: Not on file  Social Connections: Not on file  Intimate Partner Violence: Not At Risk (03/10/2023)   Humiliation, Afraid, Rape, and Kick questionnaire    Fear of Current or Ex-Partner: No    Emotionally Abused: No    Physically Abused: No  Sexually Abused: No    Family History  Problem Relation Age of Onset   Breast cancer Mother     Pancreatic cancer Mother    CAD Father    Colon cancer Paternal Grandfather     Past Surgical History:  Procedure Laterality Date   BACK SURGERY  1993   lumbar   BALLOON DILATION N/A 04/25/2023   Procedure: BALLOON DILATION;  Surgeon: Lanelle Bal, DO;  [facility]: [facility];  Service: Endoscopy;  Laterality: N/A;   BIOPSY  02/07/2019   Procedure: BIOPSY;  Surgeon: West Bali, MD;  Location: [facility];  Service: Endoscopy;;   BIOPSY  01/30/2022   Procedure: BIOPSY;  Surgeon: Lanelle Bal, DO;  Location: [facility];  Service: Endoscopy;;   BIOPSY  04/25/2023   Procedure: BIOPSY;  Surgeon: Lanelle Bal, DO;  Location: [facility];  Service: Endoscopy;;   CERVICAL SPINE SURGERY  2000   COLONOSCOPY N/A 02/07/2019   Dr. Darrick Penna: External hemorrhoids next colonoscopy in 10 years   ESOPHAGOGASTRODUODENOSCOPY N/A 02/07/2019   Dr. Darrick Penna: Barrett's esophagus without dysplasia chronic inactive gastritis but no H. 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, nonbleeding duodenal diverticulum,   ESOPHAGOGASTRODUODENOSCOPY N/A 01/05/2020   Procedure: ESOPHAGOGASTRODUODENOSCOPY (EGD);  Surgeon: West Bali, MD;  Location: [facility];  Service: Endoscopy;  Laterality: N/A;  10:30am   ESOPHAGOGASTRODUODENOSCOPY (EGD) WITH PROPOFOL N/A 12/06/2020   Procedure: ESOPHAGOGASTRODUODENOSCOPY (EGD) WITH PROPOFOL;  Surgeon: Lanelle Bal, DO;  [facility]: [facility];  Service: Endoscopy;  Laterality: N/A;  11:45am   ESOPHAGOGASTRODUODENOSCOPY (EGD) WITH PROPOFOL N/A 12/22/2021   long-segment Barrett's s/p biopsy, gastric bypass with normal sized puch and intact staple line. GJ anastomosis with healthy mucosa. Gastritis.   ESOPHAGOGASTRODUODENOSCOPY (EGD) WITH PROPOFOL N/A 01/30/2022   Procedure: ESOPHAGOGASTRODUODENOSCOPY (EGD) WITH PROPOFOL;  Surgeon: Lanelle Bal, DO;  [facility];  Service: Endoscopy;   Laterality: N/A;  11:00am   ESOPHAGOGASTRODUODENOSCOPY (EGD) WITH PROPOFOL N/A 03/11/2023   Procedure: ESOPHAGOGASTRODUODENOSCOPY (EGD) WITH PROPOFOL;  Surgeon: Benancio Deeds, MD;  [facility];  Service: Gastroenterology;  Laterality: N/A;   ESOPHAGOGASTRODUODENOSCOPY (EGD) WITH PROPOFOL N/A 04/25/2023   Procedure: ESOPHAGOGASTRODUODENOSCOPY (EGD) WITH PROPOFOL;  Surgeon: Lanelle Bal, DO;  [facility];  Service: Endoscopy;  Laterality: N/A;  10:30 am, asa 3   GASTROJEJUNOSTOMY N/A 01/03/2021   Procedure: GASTROJEJUNOSTOMY;  Surgeon: [name], MD;  Location: [facility];  Service: General;  Laterality: N/A;   HAND SURGERY     SAVORY DILATION N/A 01/05/2020   Procedure: SAVORY DILATION;  Surgeon: West Bali, MD;  [facility]: [facility];  Service: Endoscopy;  Laterality: N/A;    ROS: Review of Systems Negative except as stated above  PHYSICAL EXAM: BP 123/66 (BP Location: Left Arm, Patient Position: Sitting, Cuff Size: Normal)   Pulse 63   Temp 98.1 F (36.7 C) (Oral)   Ht 5\' 10"  (1.778 m)   Wt 197 lb (89.4 kg)   SpO2 98%   BMI 28.27 kg/m   Wt Readings from Last 3 Encounters:  05/07/23 197 lb (89.4 kg)  04/23/23 198 lb 3.2 oz (89.9 kg)  04/02/23 198 lb 3.2 oz (89.9 kg)    Physical Exam   General appearance - alert, well appearing, and in no distress Mental status - alert, oriented to person, place, and time Ears: Both ear canal and tympanic membrane within normal limits. Chest -  clear to auscultation, no wheezes, rales or rhonchi, symmetric air entry Heart - normal rate, regular rhythm, normal S1, S2, no murmurs, rubs, clicks or gallops Musculoskeletal -left shoulder: No tenderness on palpation.  Drop arm test is positive.  Unable to elevate beyond 90 degrees due to pain. Extremities -no lower extremity edema.     Latest Ref Rng & Units 04/23/2023   10:39 AM 03/12/2023    7:40 AM 03/11/2023    5:43 AM  CMP  Glucose 70 - 99 mg/dL 161   096  045   BUN 6 - 20 mg/dL 13  11  21    Creatinine 0.61 - 1.24 mg/dL 4.09  8.11  9.14   Sodium 135 - 145 mmol/L 134  132  126   Potassium 3.5 - 5.1 mmol/L 3.7  3.9  4.3   Chloride 98 - 111 mmol/L 101  103  99   CO2 22 - 32 mmol/L 24  23  20    Calcium 8.9 - 10.3 mg/dL 9.2  8.5  8.0   Total Protein 6.5 - 8.1 g/dL   5.6   Total Bilirubin 0.3 - 1.2 mg/dL   0.7   Alkaline Phos 38 - 126 U/L   51   AST 15 - 41 U/L   18   ALT 0 - 44 U/L   23    Lipid Panel     Component Value Date/Time   CHOL 87 (L) 05/22/2022 1552   TRIG 64 05/22/2022 1552   HDL 34 (L) 05/22/2022 1552   CHOLHDL 2.6 05/22/2022 1552   CHOLHDL 4.8 10/17/2016 1012   VLDL NOT CALC 10/17/2016 1012   LDLCALC 39 05/22/2022 1552    CBC    Component Value Date/Time   WBC 5.1 04/23/2023 1039   RBC 4.09 (L) 04/23/2023 1039   HGB 12.2 (L) 04/23/2023 1039   HGB 8.6 (L) 03/14/2023 0914   HGB 10.0 (L) 05/17/2021 1657   HCT 38.6 (L) 04/23/2023 1039   HCT 31.6 (L) 05/17/2021 1657   PLT 201 04/23/2023 1039   PLT 236 03/14/2023 0914   PLT 236 05/17/2021 1657   MCV 94.4 04/23/2023 1039   MCV 89 05/17/2021 1657   MCH 29.8 04/23/2023 1039   MCHC 31.6 04/23/2023 1039   RDW 14.0 04/23/2023 1039   RDW 16.4 (H) 05/17/2021 1657   LYMPHSABS 1.2 04/23/2023 1039   LYMPHSABS 2.0 08/13/2019 1617   MONOABS 0.6 04/23/2023 1039   EOSABS 0.2 04/23/2023 1039   EOSABS 0.4 08/13/2019 1617   BASOSABS 0.0 04/23/2023 1039   BASOSABS 0.1 08/13/2019 1617    ASSESSMENT AND PLAN:  1. Chronic left shoulder pain In the subacromial bursitis versus rotator cuff injury. I recommend that he keep the upcoming appointment with sports medicine for trial of steroid injection to the joint.  If no improvement with injection, next course of action would be an MRI of the shoulder. -Advised not to use NSAIDs. Discussed increasing tramadol from 50 mg in the morning, 100 mg in the evening to 100 mg twice a day.  He still has 40 tablets left of tramadol.  When  he is due for a refill, prescription will reflect this change.  2. Chronic bilateral low back pain without sciatica 3. Neck pain, bilateral -Patient agreeable to giving a trial of increased tramadol.  If he feels this is not giving sufficient relief, then we will refer for pain management.  4. Tinnitus, right ear Referral submitted to ENT.  5. Type 2 diabetes  mellitus with other circulatory complication, without long-term current use of insulin (HCC) 6. Diabetes mellitus treated with oral medication (HCC) At goal.  Continue glipizide 5 mg daily for now.  7. Need for shingles vaccine Given first Shingrix vaccine today.    Patient was given the opportunity to ask questions.  Patient verbalized understanding of the plan and was able to repeat key elements of the plan.   This documentation was completed using Paediatric nurse.  Any transcriptional errors are unintentional.  No orders of the defined types were placed in this encounter.    Requested Prescriptions    No prescriptions requested or ordered in this encounter    No follow-ups on file.  Jonah Blue, MD, FACP

## 2023-05-08 ENCOUNTER — Other Ambulatory Visit: Payer: Self-pay

## 2023-05-08 ENCOUNTER — Ambulatory Visit: Payer: Medicaid Other | Admitting: Sports Medicine

## 2023-05-08 DIAGNOSIS — M19012 Primary osteoarthritis, left shoulder: Secondary | ICD-10-CM

## 2023-05-08 DIAGNOSIS — E119 Type 2 diabetes mellitus without complications: Secondary | ICD-10-CM | POA: Diagnosis not present

## 2023-05-08 DIAGNOSIS — M25512 Pain in left shoulder: Secondary | ICD-10-CM | POA: Diagnosis not present

## 2023-05-08 DIAGNOSIS — G8929 Other chronic pain: Secondary | ICD-10-CM | POA: Diagnosis not present

## 2023-05-08 DIAGNOSIS — M12812 Other specific arthropathies, not elsewhere classified, left shoulder: Secondary | ICD-10-CM

## 2023-05-08 MED ORDER — METHYLPREDNISOLONE ACETATE 40 MG/ML IJ SUSP
40.0000 mg | INTRAMUSCULAR | Status: AC | PRN
Start: 2023-05-08 — End: 2023-05-08
  Administered 2023-05-08: 40 mg via INTRA_ARTICULAR

## 2023-05-08 MED ORDER — BUPIVACAINE HCL 0.25 % IJ SOLN
2.0000 mL | INTRAMUSCULAR | Status: AC | PRN
Start: 2023-05-08 — End: 2023-05-08
  Administered 2023-05-08: 2 mL via INTRA_ARTICULAR

## 2023-05-08 MED ORDER — LIDOCAINE HCL 1 % IJ SOLN
2.0000 mL | INTRAMUSCULAR | Status: AC | PRN
Start: 2023-05-08 — End: 2023-05-08
  Administered 2023-05-08: 2 mL

## 2023-05-08 NOTE — Progress Notes (Signed)
Jeremy [name] - 62 y.o. male MRN [id number]  Date of birth: 13-May-1961  Office Visit Note: Visit Date: 05/08/2023 PCP: Marcine Matar, MD Referred by: Marcine Matar, MD  Subjective: No chief complaint on file.  HPI: Jeremy [name] is a pleasant 62 y.o. male who presents today for acute on chronic left shoulder pain.  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  Also made worse with picking up a heavy gallon bottle of water.  Currently taking tramadol 50 mg every 8 hours as needed, receives this from his primary physician, Dr. Laural Benes.  Note reviewed from 05/07/2023, discussed possible referral to pain management.  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  Posterior history of multivessel CAD on cardiac CT. advised that he cannot take NSAIDs prolonged.  History of degenerative disc disease of the lumbar spine and bilateral hips.  He is a type-II diabetic. Managed on Glipizide 5mg  in AM, PM as needed.  Lab Results  Component Value Date   HGBA1C 4.6 (L) 03/11/2023   Pertinent ROS were reviewed with the patient and found to be negative unless otherwise specified above in HPI.   Assessment & Plan: Visit Diagnoses:  1. Chronic left shoulder pain   2. Primary osteoarthritis, left shoulder   3. Rotator cuff arthropathy of left shoulder   4. Type 2 diabetes mellitus without complication, without long-term current use of insulin (HCC)    Plan: Discussed with [name] the nature of his chronic left shoulder pain which has been exacerbated with his physical activity.  Reviewed his previous x-rays which show mild arthritic change of the [facility] joint and shoulder joint but certainly not advanced.  His exam suggest his arthritis is bothering him but he likely has a degree of rotator cuff arthropathy as well.  Through shared decision-making, did  elect to proceed with ultrasound-guided glenohumeral joint injection based on my evaluation and previous recommendation from Dr. Roda Shutters.  I would like to see him back at the 2-3-week interval to reevaluate the shoulder, could always consider subacromial joint injection if he does not get good relief from this injection.  Other treatments may be consideration of formalized physical therapy or home rehab.  He will monitor sugars for transient elevation after corticosteroid injection.  May continue his chronic tramadol 50 mg every 8 hours as needed for pain control.  Follow-up: Return in about 15 days (around 05/23/2023) for f/u about 2.5 weeks for Left shoulder (poss inj).   Meds & Orders: No orders of the defined types were placed in this encounter.   Orders Placed This Encounter  Procedures   US Guided Needle Placement - No Linked Charges     Procedures: Large Joint Inj: L glenohumeral on 05/08/2023 1:37 PM Indications: pain Details: 22 G 3.5 in needle, ultrasound-guided posterior approach Medications: 2 mL lidocaine 1 %; 2 mL bupivacaine 0.25 %; 40 mg methylPREDNISolone acetate 40 MG/ML Outcome: tolerated well, no immediate complications  US-guided glenohumeral joint injection, left shoulder After discussion on risks/benefits/indications, informed verbal consent was obtained. A timeout was then performed. The patient was positioned lying lateral recumbent on examination table. The patient's shoulder was prepped with betadine and multiple alcohol swabs and utilizing ultrasound guidance, the patient's glenohumeral joint was identified on ultrasound. Using ultrasound guidance a 22-gauge, 3.5 inch needle with a mixture of 2:2:1 cc's lidocaine:bupivicaine:depomedrol was directed from  a lateral to medial direction via in-plane technique into the glenohumeral joint with visualization of appropriate spread of injectate into the joint. Patient started having symptoms of vasovagal syncope - ice pack applied with  resolution of symptoms. He did not pass out, was able to finish injection. Otherwise, tolerated the procedure well without immediate complications.      Procedure, treatment alternatives, risks and benefits explained, specific risks discussed. Consent was given by the patient. Immediately prior to procedure a time out was called to verify the correct patient, procedure, equipment, support staff and site/side marked as required. Patient was prepped and draped in the usual sterile fashion.          Clinical History: No specialty comments available.  He reports that he quit smoking about 34 years ago. His smoking use included cigarettes. He has never used smokeless tobacco.  Recent Labs    05/22/22 1552 09/14/22 1656 03/11/23 0058  HGBA1C 5.8* 4.9 4.6*    Objective:   Vital Signs: There were no vitals taken for this visit.  Physical Exam  Gen: Well-appearing, in no acute distress; non-toxic CV:  Well-perfused. Warm.  Resp: Breathing unlabored on room air; no wheezing. Psych: Fluid speech in conversation; appropriate affect; normal thought process Neuro: Sensation intact throughout. No gross coordination deficits.   Ortho Exam - Left shoulder: No AC joint TTP, no bony TTP.  There is some tenderness palpating within the anterior shoulder joint and bicipital groove.  There is active and passive full range of motion although pain at endrange laxity and external rotation.  Positive speeds test, positive pain with empty can and drop arm test.  There is limitation active and passive external rotation.  Imaging:  *Independent review and interpretation of left shoulder x-rays from 01/23/23, 3 views including AP, scapular Y and axial views was performed by myself today.  X-rays demonstrate mild degenerative change of the [facility] joint, early degenerative changes of the glenohumeral joint but certainly no advanced arthritic change.  No acute fracture or otherwise bony abnormality noted.  Past  Medical/Family/Surgical/Social History: Medications & Allergies reviewed per EMR, new medications updated. 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   Coffee ground emesis 03/11/2023   NSAID long-term use 03/11/2023   Anemia, posthemorrhagic, acute 03/11/2023   Upper GI bleeding 03/10/2023   Hypertension associated with type 2 diabetes mellitus (HCC) 01/15/2023   Coronary artery disease of native artery of native heart with stable angina pectoris (HCC) 01/15/2023   Constipation 10/25/2022   Upper abdominal pain 07/06/2022   Nausea and vomiting 07/06/2022   Hyponatremia 06/23/2021   Low serum vitamin B12 03/23/2021   Vitamin D deficiency 03/23/2021   S/P bypass gastrojejunostomy 01/03/2021   Gastric outlet obstruction    Duodenal stricture    Primary osteoarthritis of both hips 11/15/2020   Aortic atherosclerosis (HCC) 08/30/2020   Coronary artery calcification 08/30/2020   Bilateral carpal tunnel syndrome 06/07/2020   Grief reaction 06/07/2020   Pyloric stenosis in adult 02/05/2020   Congenital hypertrophic pyloric stenosis    Duodenal web    Barrett's esophagus without dysplasia 05/19/2019   OSA (obstructive sleep apnea) 04/30/2019   Iron deficiency anemia 11/25/2018   Phimosis of penis 11/19/2018   Esophageal dysphagia 08/19/2018   Benign prostatic hyperplasia with post-void dribbling 01/31/2018   Absence of bladder continence  01/31/2018   Obesity (BMI 30-39.9) 11/05/2017   GERD (gastroesophageal reflux disease) 06/03/2017   Chronic pain of both knees 06/03/2017   Chronic lower back pain 06/03/2017   Type 2 diabetes mellitus without complication, without long-term current use of insulin (HCC) 04/12/2016   Essential hypertension 04/12/2016   Past Medical History:  Diagnosis  Date   Diabetes mellitus without complication (HCC)    GERD (gastroesophageal reflux disease)    Heart murmur    Hyperlipidemia    Hypertension    IDA (iron deficiency anemia)    Rash of entire body 03/2016   Sleep apnea    Family History  Problem Relation Age of Onset   Breast cancer Mother    Pancreatic cancer Mother    CAD Father    Colon cancer Paternal Grandfather    Past Surgical History:  Procedure Laterality Date   BACK SURGERY  1993   lumbar   BALLOON DILATION N/A 04/25/2023   Procedure: BALLOON DILATION;  Surgeon: Lanelle Bal, DO;  [facility];  Service: Endoscopy;  Laterality: N/A;   BIOPSY  02/07/2019   Procedure: BIOPSY;  Surgeon: West Bali, MD;  [facility];  Service: Endoscopy;;   BIOPSY  01/30/2022   Procedure: BIOPSY;  Surgeon: Lanelle Bal, DO;  [facility];  Service: Endoscopy;;   BIOPSY  04/25/2023   Procedure: BIOPSY;  Surgeon: Lanelle Bal, DO;  [facility];  Service: Endoscopy;;   CERVICAL SPINE SURGERY  2000   COLONOSCOPY N/A 02/07/2019   Dr. Darrick Penna: External hemorrhoids next colonoscopy in 10 years   ESOPHAGOGASTRODUODENOSCOPY N/A 02/07/2019   Dr. Darrick Penna: Barrett's esophagus without dysplasia chronic inactive gastritis but no H. 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, nonbleeding duodenal diverticulum,   ESOPHAGOGASTRODUODENOSCOPY N/A 01/05/2020   Procedure: ESOPHAGOGASTRODUODENOSCOPY (EGD);  Surgeon: [place] Bali, MD;  [facility];  Service: Endoscopy;  Laterality: N/A;  10:30am   ESOPHAGOGASTRODUODENOSCOPY (EGD) WITH PROPOFOL N/A 12/06/2020   Procedure: ESOPHAGOGASTRODUODENOSCOPY (EGD) WITH PROPOFOL;  Surgeon: Lanelle Bal, DO;  [facility];  Service: Endoscopy;  Laterality: N/A;  11:45am   ESOPHAGOGASTRODUODENOSCOPY (EGD) WITH PROPOFOL N/A 12/22/2021   long-segment Barrett's s/p biopsy, gastric  bypass with normal sized puch and intact staple line. GJ anastomosis with healthy mucosa. Gastritis.   ESOPHAGOGASTRODUODENOSCOPY (EGD) WITH PROPOFOL N/A 01/30/2022   Procedure: ESOPHAGOGASTRODUODENOSCOPY (EGD) WITH PROPOFOL;  Surgeon: Lanelle Bal, DO;  [facility];  Service: Endoscopy;  Laterality: N/A;  11:00am   ESOPHAGOGASTRODUODENOSCOPY (EGD) WITH PROPOFOL N/A 03/11/2023   Procedure: ESOPHAGOGASTRODUODENOSCOPY (EGD) WITH PROPOFOL;  Surgeon: Benancio Deeds, MD;  Location: [facility] END[facility];  Service: Gastroenterology;  Laterality: N/A;   ESOPHAGOGASTRODUODENOSCOPY (EGD) WITH PROPOFOL N/A 04/25/2023   Procedure: ESOPHAGOGASTRODUODENOSCOPY (EGD) WITH PROPOFOL;  Surgeon: Lanelle Bal, DO;  [facility];  Service: Endoscopy;  Laterality: N/A;  10:30 am, asa 3   GASTROJEJUNOSTOMY N/A 01/03/2021   Procedure: GASTROJEJUNOSTOMY;  Surgeon: [name], MD;  [facility];  Service: General;  Laterality: N/A;   HAND SURGERY     SAVORY DILATION N/A 01/05/2020   Procedure: SAVORY DILATION;  Surgeon: West Bali, MD;  [facility];  Service: Endoscopy;  Laterality: N/A;   Social History   Occupational History   Occupation: Product/process development scientist  Tobacco Use   Smoking status: Former    Current packs/day: 0.00    Types:  Cigarettes    Quit date: 10/16/1988    Years since quitting: 34.5   Smokeless tobacco: Never  Vaping Use   Vaping status: Never Used  Substance and Sexual Activity   Alcohol use: No   Drug use: Yes    Comment: rare   Sexual activity: Not on file

## 2023-05-10 ENCOUNTER — Other Ambulatory Visit: Payer: Self-pay

## 2023-05-10 ENCOUNTER — Other Ambulatory Visit: Payer: Self-pay | Admitting: Internal Medicine

## 2023-05-10 ENCOUNTER — Encounter: Payer: Self-pay | Admitting: Internal Medicine

## 2023-05-10 ENCOUNTER — Encounter: Payer: Self-pay | Admitting: Physician Assistant

## 2023-05-10 ENCOUNTER — Encounter: Payer: Self-pay | Admitting: Sports Medicine

## 2023-05-10 MED ORDER — TRAMADOL HCL 50 MG PO TABS
100.0000 mg | ORAL_TABLET | Freq: Two times a day (BID) | ORAL | 2 refills | Status: DC | PRN
  Filled 2023-05-10 – 2023-05-21 (×4): qty 120, 30d supply, fill #0
  Filled 2023-06-19: qty 120, 30d supply, fill #1
  Filled 2023-07-23: qty 120, 30d supply, fill #2
  Filled ????-??-??: fill #1

## 2023-05-10 NOTE — Telephone Encounter (Signed)
Requested medication (s) are due for refill today - yes  Requested medication (s) are on the active medication list -yes  Future visit scheduled -yes  Last refill: 02/07/23 #90 2RF  Notes to clinic: non delegated Rx  Requested Prescriptions  Pending Prescriptions Disp Refills   traMADol (ULTRAM) 50 MG tablet 90 tablet 2    Sig: Take 1 tablet (50 mg total) by mouth every 8 (eight) hours as needed     Not Delegated - Analgesics:  Opioid Agonists Failed - 05/10/2023  1:25 AM      Failed - This refill cannot be delegated      Failed - Urine Drug Screen completed in last 360 days      Passed - Valid encounter within last 3 months    Recent Outpatient Visits           3 days ago Chronic left shoulder pain   [place] [facility] Jonah Blue B, MD   3 months ago Hypertension associated with type 2 diabetes mellitus [facility])   [place] [facility] Jonah Blue B, MD   7 months ago Type 2 diabetes mellitus with obesity [facility])   [place] [facility] & [facility] Marcine Matar, MD   11 months ago Right elbow pain   [place] [facility] [place], [place] W, NP   12 months ago Type 2 diabetes mellitus with obesity [facility])   [place] [facility] Marcine Matar, MD       Future Appointments             In 1 week Madelyn Brunner, DO [place]   In 4 months Marcine Matar, MD [facility]               Requested Prescriptions  Pending Prescriptions Disp Refills   traMADol (ULTRAM) 50 MG tablet 90 tablet 2    Sig: Take 1 tablet (50 mg total) by mouth every 8 (eight) hours as needed     Not Delegated - Analgesics:  Opioid Agonists Failed - 05/10/2023  1:25 AM      Failed - This refill cannot be delegated      Failed - Urine Drug Screen completed in last 360 days      Passed - Valid encounter  within last 3 months    Recent Outpatient Visits           3 days ago Chronic left shoulder pain   [place] [facility] Jonah Blue B, MD   3 months ago Hypertension associated with type 2 diabetes mellitus [facility])   [place] [facility] Jonah Blue B, MD   7 months ago Type 2 diabetes mellitus with obesity [facility])   [place] [facility] Marcine Matar, MD   11 months ago Right elbow pain   [place] [facility] [place], [place] W, NP   12 months ago Type 2 diabetes mellitus with obesity [facility])   [place] [facility] Marcine Matar, MD       Future Appointments             In 1 week Madelyn Brunner, DO  [place]   In 4 months Laural Benes, Binnie Rail, MD [facility]

## 2023-05-11 ENCOUNTER — Other Ambulatory Visit: Payer: Self-pay

## 2023-05-13 ENCOUNTER — Other Ambulatory Visit: Payer: Self-pay | Admitting: Internal Medicine

## 2023-05-13 DIAGNOSIS — M62838 Other muscle spasm: Secondary | ICD-10-CM

## 2023-05-14 ENCOUNTER — Encounter: Payer: Self-pay | Admitting: Internal Medicine

## 2023-05-14 ENCOUNTER — Other Ambulatory Visit: Payer: Self-pay

## 2023-05-14 ENCOUNTER — Encounter: Payer: Self-pay | Admitting: Physician Assistant

## 2023-05-14 NOTE — Telephone Encounter (Signed)
Requested medication (s) are due for refill today - yes  Requested medication (s) are on the active medication list -yes  Future visit scheduled -yes  Last refill: 03/28/23 #60   Notes to clinic: non delegated Rx  Requested Prescriptions  Pending Prescriptions Disp Refills   cyclobenzaprine (FLEXERIL) 10 MG tablet 60 tablet 0    Sig: Take 1 tablet (10 mg total) by mouth 2 (two) times daily as needed for muscle spasms.     Not Delegated - Analgesics:  Muscle Relaxants Failed - 05/13/2023  5:57 PM      Failed - This refill cannot be delegated      Passed - Valid encounter within last 6 months    Recent Outpatient Visits           1 week ago Chronic left shoulder pain   [place] [facility] Jonah Blue B, MD   3 months ago Hypertension associated with type 2 diabetes mellitus [facility])   [place] [facility] Jonah Blue B, MD   8 months ago Type 2 diabetes mellitus with obesity [facility])   [place] [facility] Marcine Matar, MD   11 months ago Right elbow pain   [place] [facility] [place], [place] W, NP   12 months ago Type 2 diabetes mellitus with obesity [facility])   [place] [facility] Marcine Matar, MD       Future Appointments             In 1 week Madelyn Brunner, DO [place]   In 3 months Marcine Matar, MD [facility]               Requested Prescriptions  Pending Prescriptions Disp Refills   cyclobenzaprine (FLEXERIL) 10 MG tablet 60 tablet 0    Sig: Take 1 tablet (10 mg total) by mouth 2 (two) times daily as needed for muscle spasms.     Not Delegated - Analgesics:  Muscle Relaxants Failed - 05/13/2023  5:57 PM      Failed - This refill cannot be delegated      Passed - Valid encounter within last 6 months    Recent Outpatient Visits           1 week ago  Chronic left shoulder pain   [place] [facility] Jon[facility] Blue B, MD   3 months ago Hypertension associated with type 2 diabetes mellitus [facility])   [place] [facility] Jonah Blue B, MD   8 months ago Type 2 diabetes mellitus with obesity [facility])   [place] [facility] Marcine Matar, MD   11 months ago Right elbow pain   [place] [facility] [place], [place] W, NP   12 months ago Type 2 diabetes mellitus with obesity [facility])   [place] [facility] Marcine Matar, MD       Future Appointments             In 1 week Madelyn Brunner, DO  [place]   In 3 months Laural Benes, Binnie Rail, MD [facility] & [facility]

## 2023-05-14 NOTE — Telephone Encounter (Signed)
Not a [facility] patient.  

## 2023-05-15 NOTE — Telephone Encounter (Signed)
Requested medication (s) are due for refill today -yes  Requested medication (s) are on the active medication list -yes  Future visit scheduled -yes  Last refill: 03/28/23 #60  Notes to clinic: non delegated Rx  Requested Prescriptions  Pending Prescriptions Disp Refills   cyclobenzaprine (FLEXERIL) 10 MG tablet 60 tablet 0    Sig: Take 1 tablet (10 mg total) by mouth 2 (two) times daily as needed for muscle spasms.     Not Delegated - Analgesics:  Muscle Relaxants Failed - 05/14/2023  1:14 PM      Failed - This refill cannot be delegated      Passed - Valid encounter within last 6 months    Recent Outpatient Visits           1 week ago Chronic left shoulder pain   [place] [facility] Jonah Blue B, MD   4 months ago Hypertension associated with type 2 diabetes mellitus [facility])   [place] [facility] Jonah Blue B, MD   8 months ago Type 2 diabetes mellitus with obesity [facility])   [place] [facility] Marcine Matar, MD   11 months ago Right elbow pain   [place] [facility] [place] Rigg, NP   1 year ago Type 2 diabetes mellitus with obesity [facility])   [place] Marcine Matar, MD       Future Appointments             In 1 week Madelyn Brunner, DO [place]   In 3 months Marcine Matar, MD [facility]               Requested Prescriptions  Pending Prescriptions Disp Refills   cyclobenzaprine (FLEXERIL) 10 MG tablet 60 tablet 0    Sig: Take 1 tablet (10 mg total) by mouth 2 (two) times daily as needed for muscle spasms.     Not Delegated - Analgesics:  Muscle Relaxants Failed - 05/14/2023  1:14 PM      Failed - This refill cannot be delegated      Passed - Valid encounter within last 6 months    Recent Outpatient Visits           1 week ago  Chronic left shoulder pain   [place] [facility] Jonah Blue B, MD   4 months ago Hypertension associated with type 2 diabetes mellitus [facility])   [place] [facility] Jon[facility] Blue B, MD   8 months ago Type 2 diabetes mellitus with obesity [facility])   [place] [facility] Marcine Matar, MD   11 months ago Right elbow pain   [place] [facility] Cla[place] Rigg, NP   1 year ago Type 2 diabetes mellitus with obesity [facility])   [place] [facility] Marcine Matar, MD       Future Appointments             In 1 week Madelyn Brunner, DO [place]   In 3 months Laural Benes, Binnie Rail, MD [facility]

## 2023-05-16 ENCOUNTER — Other Ambulatory Visit: Payer: Self-pay

## 2023-05-16 MED ORDER — CYCLOBENZAPRINE HCL 10 MG PO TABS
10.0000 mg | ORAL_TABLET | Freq: Two times a day (BID) | ORAL | 0 refills | Status: DC | PRN
Start: 2023-05-16 — End: 2023-08-20
  Filled 2023-05-16: qty 60, 30d supply, fill #0
  Filled 2023-05-19 – 2023-06-19 (×3): qty 60, 30d supply, fill #1
  Filled 2023-07-23: qty 60, 30d supply, fill #2
  Filled ????-??-??: fill #1

## 2023-05-18 ENCOUNTER — Encounter: Payer: Self-pay | Admitting: Internal Medicine

## 2023-05-18 ENCOUNTER — Encounter: Payer: Self-pay | Admitting: Physician Assistant

## 2023-05-18 ENCOUNTER — Other Ambulatory Visit: Payer: Self-pay

## 2023-05-19 ENCOUNTER — Encounter: Payer: Self-pay | Admitting: Internal Medicine

## 2023-05-21 ENCOUNTER — Other Ambulatory Visit: Payer: Self-pay

## 2023-05-21 ENCOUNTER — Telehealth: Payer: Self-pay

## 2023-05-21 NOTE — Telephone Encounter (Signed)
Called & spoke to the patient. Verified name & DOB. Offered patient an appointment with PCP for 05/21/2023 to address rash and possible bug bite. Patient declined appointment due to an appointment with another provider tomorrow 05/22/2023. Mobile bus closed on 05/21/2023. Advised patient to go to urgent care on 05/22/2023 at most convenient time. Patient expressed verbal understanding. No further questions at this time. FYI.

## 2023-05-22 ENCOUNTER — Encounter: Payer: Self-pay | Admitting: Sports Medicine

## 2023-05-22 ENCOUNTER — Ambulatory Visit: Payer: Medicaid Other | Admitting: Sports Medicine

## 2023-05-22 DIAGNOSIS — E119 Type 2 diabetes mellitus without complications: Secondary | ICD-10-CM | POA: Diagnosis not present

## 2023-05-22 DIAGNOSIS — M25512 Pain in left shoulder: Secondary | ICD-10-CM

## 2023-05-22 DIAGNOSIS — M19012 Primary osteoarthritis, left shoulder: Secondary | ICD-10-CM

## 2023-05-22 DIAGNOSIS — G8929 Other chronic pain: Secondary | ICD-10-CM | POA: Diagnosis not present

## 2023-05-22 DIAGNOSIS — M1711 Unilateral primary osteoarthritis, right knee: Secondary | ICD-10-CM

## 2023-05-22 LAB — HM DIABETES EYE EXAM

## 2023-05-22 NOTE — Progress Notes (Signed)
Doing a lot better Motion has improved

## 2023-05-22 NOTE — Progress Notes (Signed)
Jeremy [name] - 62 y.o. male MRN [id number]  Date of birth: [birth year]  Office Visit Note: Visit Date: 05/22/2023 PCP: Jeremy Matar, MD Referred by: Jeremy Matar, MD  Subjective: Chief Complaint  Patient presents with   Left Shoulder - Follow-up   HPI: Jeremy [name] is a pleasant 62 y.o. male who presents today for follow-up of chronic left shoulder pain.  Had US-guided L-GHJ injection on 05/08/23 -continues to feel significantly help his pain.  Feels like he is 98-99% improved.  He initially had some hiccups for the first few days to week but this has resolved itself.  He is on tramadol 4 times daily from his primary physician for this and his bilateral chronic knee and neck/back pain, given that his shoulder pain is improving he is going to work on reducing this to 3 times daily and possibly twice daily.  Did not notice significant rise in his glucose levels.  He does note some formalized physical therapy to work through his very mild stiffness would be helpful.   Pertinent ROS were reviewed with the patient and found to be negative unless otherwise specified above in HPI.   Assessment & Plan: Visit Diagnoses:  1. Type 2 diabetes mellitus without complication, without long-term current use of insulin (HCC)   2. Chronic left shoulder pain   3. Primary osteoarthritis, left shoulder   4. Unilateral primary osteoarthritis, right knee    Plan: Both Jeremy [name] and I were very pleased with his relief from the ultrasound-guided corticosteroid injection of the shoulder.  This points to this being a mild exacerbation of OA with possibly some rotator cuff irritation.  He is between 98-99% improved.  He still has some very residual stiffness, we will send him to formalized physical therapy to work on range of motion and strengthening of the rotator cuff as well.  He does have bilateral knee pain, I did review his previous MRI which showed some arthritic change and chronic meniscal injury.  We  can always address this with formal PT in the future as well.  For pain control he will continue his tramadol 50-100 mg every 8 hours as needed, he will work on reducing this with his PCP.  He will follow-up with me as needed.  Follow-up: No follow-ups on file.   Meds & Orders: No orders of the defined types were placed in this encounter.  No orders of the defined types were placed in this encounter.    Procedures: No procedures performed      Clinical History: No specialty comments available.  He reports that he quit smoking about 34 years ago. His smoking use included cigarettes. He has never used smokeless tobacco.  Recent Labs    05/22/22 1552 09/14/22 1656 03/11/23 0058  HGBA1C 5.8* 4.9 4.6*    Objective:   Vital Signs: There were no vitals taken for this visit.  Physical Exam  Gen: Well-appearing, in no acute distress; non-toxic CV: Well-perfused. Warm.  Resp: Breathing unlabored on room air; no wheezing. Psych: Fluid speech in conversation; appropriate affect; normal thought process Neuro: Sensation intact throughout. No gross coordination deficits.   Ortho Exam - Left shoulder: No bony tenderness, no swelling or redness.  There is essentially full active and passive range of motion.  There is about 5 degrees less of external rotation.  Thumb to approximately L1 compared to T12 of the contralateral arm.  No weakness with rotator cuff testing.  Imaging:  Narrative & Impression  CLINICAL  DATA:  Knee trauma   EXAM: MRI OF THE RIGHT KNEE WITHOUT CONTRAST   TECHNIQUE: Multiplanar, multisequence MR imaging of the knee was performed. No intravenous contrast was administered.   COMPARISON:  None.   FINDINGS: MENISCI   Medial: Degeneration of the body and posterior horn of the medial meniscus. No discrete meniscal tear.   Lateral: Possible undersurface tear of the anterior periphery of the anterior horn of lateral meniscus. Degeneration of the  lateral meniscus.   LIGAMENTS   Cruciates: ACL and PCL are intact.   Collaterals: Medial collateral ligament is intact. Lateral collateral ligament complex is intact.   CARTILAGE   Patellofemoral: Partial-thickness cartilage loss of the lateral patellar facet with subchondral reactive marrow changes. Partial-thickness cartilage loss of the medial patellar facet. Partial-thickness cartilage loss of the trochlear groove and lateral trochlea.   Medial: Mild partial-thickness cartilage loss of the medial femorotibial compartment.   Lateral:  No chondral defect.   JOINT: Small joint effusion. Normal Hoffa's fat-pad. No plical thickening.   POPLITEAL FOSSA: Popliteus tendon is intact. No Baker's cyst.   EXTENSOR MECHANISM: Intact quadriceps tendon. Intact patellar tendon. Intact lateral patellar retinaculum. Intact medial patellar retinaculum. Intact MPFL.   BONES: No aggressive osseous lesion. No fracture or dislocation.   Other: No fluid collection or hematoma. Muscles are normal.   IMPRESSION: 1. Possible undersurface tear of the anterior periphery of the anterior horn of lateral meniscus. Degeneration of the lateral meniscus. 2. Partial-thickness cartilage loss of the lateral patellar facet with subchondral reactive marrow changes. Partial-thickness cartilage loss of the medial patellar facet. Partial-thickness cartilage loss of the trochlear groove and lateral trochlea. 3. Mild partial-thickness cartilage loss of the medial femorotibial compartment.     Electronically Signed   By: Jeremy [name]   On: 02/14/2021 08:53    Past Medical/Family/Surgical/Social History: Medications & Allergies reviewed per EMR, new medications updated. Patient Active Problem List   Diagnosis Date Noted   Anastomotic stricture of gastrojejunostomy 04/07/2023   PUD (peptic ulcer disease) 04/02/2023   Anemia 04/02/2023   Marginal ulcer 03/12/2023   Duodenal anastomotic stricture  03/12/2023   Coffee ground emesis 03/11/2023   NSAID long-term use 03/11/2023   Anemia, posthemorrhagic, acute 03/11/2023   Upper GI bleeding 03/10/2023   Hypertension associated with type 2 diabetes mellitus (HCC) 01/15/2023   Coronary artery disease of native artery of native heart with stable angina pectoris (HCC) 01/15/2023   Constipation 10/25/2022   Upper abdominal pain 07/06/2022   Nausea and vomiting 07/06/2022   Hyponatremia 06/23/2021   Low serum vitamin B12 03/23/2021   Vitamin D deficiency 03/23/2021   S/P bypass gastrojejunostomy 01/03/2021   Gastric outlet obstruction    Duodenal stricture    Primary osteoarthritis of both hips 11/15/2020   Aortic atherosclerosis (HCC) 08/30/2020   Coronary artery calcification 08/30/2020   Bilateral carpal tunnel syndrome 06/07/2020   Grief reaction 06/07/2020   Pyloric stenosis in adult 02/05/2020   Congenital hypertrophic pyloric stenosis    Duodenal web    Barrett's esophagus without dysplasia 05/19/2019   OSA (obstructive sleep apnea) 04/30/2019   Iron deficiency anemia 11/25/2018   Phimosis of penis 11/19/2018   Esophageal dysphagia 08/19/2018   Benign prostatic hyperplasia with post-void dribbling 01/31/2018   Absence of bladder continence 01/31/2018   Obesity (BMI 30-39.9) 11/05/2017   GERD (gastroesophageal reflux disease) 06/03/2017   Chronic pain of both knees 06/03/2017   Chronic lower back pain 06/03/2017   Type 2 diabetes mellitus without complication, without  long-term current use of insulin (HCC) 04/12/2016   Essential hypertension 04/12/2016   Past Medical History:  Diagnosis Date   Diabetes mellitus without complication (HCC)    GERD (gastroesophageal reflux disease)    Heart murmur    Hyperlipidemia    Hypertension    IDA (iron deficiency anemia)    Rash of entire body 03/2016   Sleep apnea    Family History  Problem Relation Age of Onset   Breast cancer Mother    Pancreatic cancer Mother    CAD  Father    Colon cancer Paternal Grandfather    Past Surgical History:  Procedure Laterality Date   BACK SURGERY  1993   lumbar   BALLOON DILATION N/A 04/25/2023   Procedure: BALLOON DILATION;  Surgeon: Lanelle Bal, DO;  [facility]: [facility];  Service: Endoscopy;  Laterality: N/A;   BIOPSY  02/07/2019   Procedure: BIOPSY;  Surgeon: West Bali, MD;  Location: [facility];  Service: Endoscopy;;   BIOPSY  01/30/2022   Procedure: BIOPSY;  Surgeon: Lanelle Bal, DO;  [facility]: [facility];  Service: Endoscopy;;   BIOPSY  04/25/2023   Procedure: BIOPSY;  Surgeon: Lanelle Bal, DO;  [facility]: [facility];  Service: Endoscopy;;   CERVICAL SPINE SURGERY  2000   COLONOSCOPY N/A 02/07/2019   Dr. Darrick Penna: External hemorrhoids next colonoscopy in 10 years   ESOPHAGOGASTRODUODENOSCOPY N/A 02/07/2019   Dr. Darrick Penna: Barrett's esophagus without dysplasia chronic inactive gastritis but no H. 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, nonbleeding duodenal diverticulum,   ESOPHAGOGASTRODUODENOSCOPY N/A 01/05/2020   Procedure: ESOPHAGOGASTRODUODENOSCOPY (EGD);  Surgeon: West Bali, MD;  [facility]: [facility];  Service: Endoscopy;  Laterality: N/A;  10:30am   ESOPHAGOGASTRODUODENOSCOPY (EGD) WITH PROPOFOL N/A 12/06/2020   Procedure: ESOPHAGOGASTRODUODENOSCOPY (EGD) WITH PROPOFOL;  Surgeon: Lanelle Bal, DO;  [facility];  Service: Endoscopy;  Laterality: N/A;  11:45am   ESOPHAGOGASTRODUODENOSCOPY (EGD) WITH PROPOFOL N/A 12/22/2021   long-segment Barrett's s/p biopsy, gastric bypass with normal sized puch and intact staple line. GJ anastomosis with healthy mucosa. Gastritis.   ESOPHAGOGASTRODUODENOSCOPY (EGD) WITH PROPOFOL N/A 01/30/2022   Procedure: ESOPHAGOGASTRODUODENOSCOPY (EGD) WITH PROPOFOL;  Surgeon: Lanelle Bal, DO;  [facility];  Service: Endoscopy;  Laterality: N/A;  11:00am    ESOPHAGOGASTRODUODENOSCOPY (EGD) WITH PROPOFOL N/A 03/11/2023   Procedure: ESOPHAGOGASTRODUODENOSCOPY (EGD) WITH PROPOFOL;  Surgeon: Benancio Deeds, MD;  Location: [facility];  Service: Gastroenterology;  Laterality: N/A;   ESOPHAGOGASTRODUODENOSCOPY (EGD) WITH PROPOFOL N/A 04/25/2023   Procedure: ESOPHAGOGASTRODUODENOSCOPY (EGD) WITH PROPOFOL;  Surgeon: Lanelle Bal, DO;  [facility];  Service: Endoscopy;  Laterality: N/A;  10:30 am, asa 3   GASTROJEJUNOSTOMY N/A 01/03/2021   Procedure: GASTROJEJUNOSTOMY;  Surgeon: [name], MD;  Location: [facility];  Service: General;  Laterality: N/A;   HAND SURGERY     SAVORY DILATION N/A 01/05/2020   Procedure: SAVORY DILATION;  Surgeon: West Bali, MD;  [facility]: [facility];  Service: Endoscopy;  Laterality: N/A;   Social History   Occupational History   Occupation: Product/process development scientist  Tobacco Use   Smoking status: Former    Current packs/day: 0.00    Types: Cigarettes    Quit date: 10/16/1988    Years since quitting: 34.6   Smokeless tobacco: Never  Vaping Use   Vaping status: Never Used  Substance and Sexual Activity   Alcohol use: No  Drug use: Yes    Comment: rare   Sexual activity: Not on file

## 2023-06-01 ENCOUNTER — Other Ambulatory Visit: Payer: Self-pay

## 2023-06-03 NOTE — Progress Notes (Unsigned)
Referring Provider: Marcine Matar, MD Primary Care Physician:  Marcine Matar, MD Primary GI Physician: Dr. Marletta Lor  Chief Complaint  Patient presents with   Follow-up    Doing well, no issues    HPI:   Jeremy [name] is a 62 y.o. 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 Lovell Sheehan on 01/03/2021, history of long segment Barrett's esophagus, IDA following with hematology for IV iron prn.  In May of this year, he developed coffee-ground emesis and was found to have large ulceration and acquired stenosis causing partial gastric outlet obstruction.  He is presenting today for follow-up s/p surveillance EGD.  Last seen in our office 04/02/2023.  Reported he was doing well, advance his diet from liquids by cup to solids.  No recurrent overt GI bleeding.  He was experiencing early satiety.  Admits to previously taking Aleve twice daily, but had discontinued this.  He was being compliant with PPI twice daily, but only taking Carafate twice daily.  Also reported skipping 2-3 days between bowel movements which is more of a chronic issue.  CBC was updated, he was scheduled for EGD, and started on MiraLAX daily.  CBC showed hemoglobin improved to 11.6.  EGD 04/25/2023: Esophageal mucosal changes secondary to established long segment Barrett's disease, gastrojejunostomy characterized by healthy-appearing mucosa, acquired duodenal stenosis dilated, gastritis biopsied.  Gastric biopsy with reactive gastropathy, chronic gastritis, negative for H. pylori.  Today:  No nausea, vomiting or abdominal pain. Doesn't eat a lot of food at a time. Eats small portions. Doesn't get hungry. Reports he didn't eating until 9 pm last night, but had a little more than 1/2 a frozen pizza then 2 baked potatoes. No weight loss. Would like to lose weight. No dysphagia  or heartburn.   Bowels fluctuate from BMs 5-6 times a week to 3-4 times a week. Stools can be small pieces or large hard stools. Doesn't take anything to help with this. If he eats beef, he has a good bowel movement. No brbpr or melena.    Has taken a few Aleve due to hurting his back. Total of 6 in 2 months.   Past Medical History:  Diagnosis Date   Diabetes mellitus without complication (HCC)    GERD (gastroesophageal reflux disease)    Heart murmur    Hyperlipidemia    Hypertension    IDA (iron deficiency anemia)    Rash of entire body 03/2016   Sleep apnea     Past Surgical History:  Procedure Laterality Date   BACK SURGERY  1993   lumbar   BALLOON DILATION N/A 04/25/2023   Procedure: BALLOON DILATION;  Surgeon: Lanelle Bal, DO;  [facility];  Service: Endoscopy;  Laterality: N/A;   BIOPSY  02/07/2019   Procedure: BIOPSY;  Surgeon: West Bali, MD;  [facility];  Service: Endoscopy;;   BIOPSY  01/30/2022   Procedure: BIOPSY;  Surgeon: Lanelle Bal, DO;  [facility];  Service: Endoscopy;;   BIOPSY  04/25/2023   Procedure: BIOPSY;  Surgeon: Lanelle Bal, DO;  [facility];  Service: Endoscopy;;   CERVICAL SPINE SURGERY  2000   COLONOSCOPY N/A 02/07/2019   Dr. Darrick Penna: External hemorrhoids next colonoscopy in 10 years   ESOPHAGOGASTRODUODENOSCOPY N/A 02/07/2019   Dr. Darrick Penna: Barrett's esophagus without dysplasia chronic inactive gastritis  but no H. 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, nonbleeding duodenal diverticulum,   ESOPHAGOGASTRODUODENOSCOPY N/A 01/05/2020   Procedure: ESOPHAGOGASTRODUODENOSCOPY (EGD);  Surgeon: West Bali, MD;  [facility];  Service: Endoscopy;  Laterality: N/A;  10:30am   ESOPHAGOGASTRODUODENOSCOPY (EGD) WITH PROPOFOL N/A 12/06/2020   Procedure: ESOPHAGOGASTRODUODENOSCOPY (EGD) WITH PROPOFOL;  Surgeon: Lanelle Bal, DO;  [facility];  Service: Endoscopy;  Laterality: N/A;  11:45am   ESOPHAGOGASTRODUODENOSCOPY (EGD) WITH PROPOFOL N/A 12/22/2021   long-segment Barrett's s/p biopsy, gastric bypass with normal sized puch and intact staple line. GJ anastomosis with healthy mucosa. Gastritis.   ESOPHAGOGASTRODUODENOSCOPY (EGD) WITH PROPOFOL N/A 01/30/2022   Procedure: ESOPHAGOGASTRODUODENOSCOPY (EGD) WITH PROPOFOL;  Surgeon: Lanelle Bal, DO;  [facility];  Service: Endoscopy;  Laterality: N/A;  11:00am   ESOPHAGOGASTRODUODENOSCOPY (EGD) WITH PROPOFOL N/A 03/11/2023   Procedure: ESOPHAGOGASTRODUODENOSCOPY (EGD) WITH PROPOFOL;  Surgeon: Benancio Deeds, MD;  Location[facility];  Service: Gastroenterology;  Laterality: N/A;   ESOPHAGOGASTRODUODENOSCOPY (EGD) WITH PROPOFOL N/A 04/25/2023   Procedure: ESOPHAGOGASTRODUODENOSCOPY (EGD) WITH PROPOFOL;  Surgeon: Lanelle Bal, DO;  [facility];  Service: Endoscopy;  Laterality: N/A;  10:30 am, asa 3   GASTROJEJUNOSTOMY N/A 01/03/2021   Procedure: GASTROJEJUNOSTOMY;  Surgeon: [name], MD;  Location: [facility];  Service: General;  Laterality: N/A;   HAND SURGERY     SAVORY DILATION N/A 01/05/2020   Procedure: SAVORY DILATION;  Surgeon: West Bali, MD;  [facility]: [facility];  Service: Endoscopy;  Laterality: N/A;    Current Outpatient Medications  Medication Sig Dispense Refill   Accu-Chek Softclix Lancets lancets Use to check blood sugar three times daily. 100 each 2   acetaminophen (TYLENOL) 500 MG tablet Take 1,000 mg by mouth in the morning and at bedtime.      ascorbic acid (VITAMIN C) 500 MG tablet Take 500 mg by mouth 2 (two) times daily.     Blood Glucose Monitoring Suppl (ACCU-CHEK GUIDE) w/Device KIT Use to check blood sugar three times daily. 1 kit 0   Cyanocobalamin (VITAMIN B-12 PO) Take 2,500 mcg by mouth daily.     cyclobenzaprine (FLEXERIL) 10 MG tablet Take 1 tablet (10 mg total) by  mouth 2 (two) times daily as needed for muscle spasms. 180 tablet 0   ferrous sulfate (FEROSUL) 325 (65 FE) MG tablet Take 1 tablet (325 mg total) by mouth 2 (two) times daily with a meal. 180 tablet 0   fluticasone (FLONASE) 50 MCG/ACT nasal spray Place 1 spray into both nostrils daily as needed for allergies or rhinitis. 16 g 0   glipiZIDE (GLUCOTROL) 5 MG tablet Take 1 tablet (5 mg total) in the morning AND 1 tablet (5 mg total) every evening. (Patient taking differently: Take 1 tablet (5 mg total) in the morning depending on diet) 180 tablet 0   lisinopril (ZESTRIL) 20 MG tablet Take 1 tablet (20 mg total) by mouth in the morning and at bedtime. 180 tablet 0   loratadine (CLARITIN) 10 MG tablet Take 10 mg by mouth daily.     Magnesium Oxide 200 MG TABS Take 200 mg by mouth in the morning and at bedtime.     Multiple Vitamins-Minerals (MULTIVITAMIN WITH MINERALS) tablet Take 1 tablet by mouth 2 (two) times daily.     ondansetron (ZOFRAN) 8 MG tablet Take 1 tablet (8 mg total) by mouth daily as needed for nausea or  vomiting. 30 tablet 2   pantoprazole (PROTONIX) 40 MG tablet Take 1 tablet (40 mg total) by mouth 2 (two) times daily before a meal. 60 tablet 1   Potassium 99 MG TABS Take 198 mg by mouth 2 (two) times daily.     rOPINIRole (REQUIP) 1 MG tablet Take 1 tablet (1 mg total) by mouth at bedtime. 90 tablet 1   sildenafil (VIAGRA) 50 MG tablet Take 1 tablet (50 mg total) by mouth 1/2 hr before intercourse.  Limit use to 1 tab/24 hr 10 tablet 3   sucralfate (CARAFATE) 1 GM/10ML suspension Take 10 mLs (1 g total) by mouth every 8 (eight) hours. 420 mL 1   traMADol (ULTRAM) 50 MG tablet Take 2 tablets (100 mg total) by mouth every 12 (twelve) hours as needed (to fill on or after 05/20/2023). 120 tablet 2   No current facility-administered medications for this visit.    Allergies as of 06/04/2023 - Review Complete 06/04/2023  Allergen Reaction Noted   Cymbalta [duloxetine hcl] Other (See  Comments) 06/05/2017   Doxycycline  03/02/2021   Other Nausea And Vomiting 01/24/2019   Robaxin [methocarbamol] Other (See Comments) 06/02/2019    Family History  Problem Relation Age of Onset   Breast cancer Mother    Pancreatic cancer Mother    CAD Father    Colon cancer Paternal Grandfather     Social History   Socioeconomic History   Marital status: Significant Other    Spouse name: Not on file   Number of children: Not on file   Years of education: Not on file   Highest education level: Not on file  Occupational History   Occupation: Product/process development scientist  Tobacco Use   Smoking status: Former    Current packs/day: 0.00    Types: Cigarettes    Quit date: 10/16/1988    Years since quitting: 34.6   Smokeless tobacco: Never  Vaping Use   Vaping status: Never Used  Substance and Sexual Activity   Alcohol use: No   Drug use: Yes    Comment: rare   Sexual activity: Not on file  Other Topics Concern   Not on file  Social History Narrative   Volunteers as IT sales professional   Social Determinants of Health   Financial Resource Strain: Not on file  Food Insecurity: No Food Insecurity (03/10/2023)   Hunger Vital Sign    Worried About Running Out of Food in the Last Year: Never true    Ran Out of Food in the Last Year: Never true  Transportation Needs: No Transportation Needs (03/10/2023)   PRAPARE - Administrator, Civil Service (Medical): No    Lack of Transportation (Non-Medical): No  Physical Activity: Not on file  Stress: Not on file  Social Connections: Not on file    Review of Systems: Gen: Denies fever, chills, cold or flu like symptoms, pre-syncope, or syncope.  CV: Denies chest pain, palpitations. Resp: Denies dyspnea at rest, cough. GI: See HPI Heme: See HPI  Physical Exam: BP 122/65 (BP Location: Right Arm, Patient Position: Sitting, Cuff Size: Normal)   Pulse 62   Temp 98.3 F (36.8 C) (Oral)   Ht 5\' 10"  (1.778 m)   Wt 201 lb 6.4 oz (91.4 kg)    SpO2 96%   BMI 28.90 kg/m  General:   Alert and oriented. No distress noted. Pleasant and cooperative.  Head:  Normocephalic and atraumatic. Eyes:  Conjuctiva clear without scleral icterus. Heart:  S1, S2  present without murmurs appreciated. Lungs:  Clear to auscultation bilaterally. No wheezes, rales, or rhonchi. No distress.  Abdomen:  +BS, soft, non-tender and non-distended. No rebound or guarding. No HSM or masses noted. Msk:  Symmetrical without gross deformities. Normal posture. Extremities:  Without edema. Neurologic:  Alert and  oriented x4 Psych:  Normal mood and affect.    Assessment:  62 y.o. 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 Lovell Sheehan on 01/03/2021 (duodenum/strictured area remains in place), history of long segment Barrett's esophagus, IDA following with hematology for IV iron prn, PUD May 2024, presenting today for follow-up s/p surveillance EGD.   Peptic ulcer disease: EGD 03/11/2023 with large gastric ulceration in the setting of Aleve.  He has since discontinued use of Aleve significantly and is taking PPI twice a day.  Surveillance EGD 04/25/2023 with long segment Barrett's esophagus, gastrojejunostomy characterized by healthy-appearing mucosa, acquired duodenal stenosis dilated, gastritis biopsied.  Pathology with reactive gastropathy, chronic gastritis, negative for H. pylori.  Early satiety/decreased appetite:  Patient reports eating less than he used to and not getting full for a long time, but once he is hungry, he can eat a fairly large amount.  Etiology is not entirely clear to me.  He does have history of pyloric/duodenal stenosis.  Recent EGD in July again with duodenal stenosis that was dilated; however, he has widely patent gastrojejunostomy anastomosis which should allow for adequate  gastric emptying.  It is possible that he has underlying gastroparesis in the setting of diabetes.  As he is doing well overall without nausea, vomiting, abdominal pain, or weight loss, we will continue to monitor for now.  Could consider gastric emptying study if needed in the future vs seeing surgery for consideration of revision/true gastric bypass.   Constipation:  Chronic.  Not adequately managed as he is not taking anything to help with this.  No alarm symptoms.  Recommended Benefiber daily.  Can also add MiraLAX on top of this if needed.  GERD: Well-controlled on PPI twice daily.  Long segment Barrett's esophagus: Last esophageal biopsy April 2023 with intestinal metaplasia consistent with Barrett's esophagus, negative for dysplasia.  He will be due for surveillance EGD in 2026.    Plan:  Continue pantoprazole 40 mg twice daily. Avoid all NSAIDs. 4-6 small meals daily. Low-fat diet. Start Benefiber 3 teaspoons daily x 2 weeks, then increase to twice daily. Add MiraLAX 17 g daily if Benefiber does not allow good productive bowel movements daily. Follow-up in 3 months or sooner if needed.   Ermalinda Memos, PA-C [facility] 06/04/2023

## 2023-06-04 ENCOUNTER — Encounter: Payer: Self-pay | Admitting: Gastroenterology

## 2023-06-04 ENCOUNTER — Encounter: Payer: Self-pay | Admitting: Internal Medicine

## 2023-06-04 ENCOUNTER — Ambulatory Visit: Payer: Medicaid Other | Admitting: Gastroenterology

## 2023-06-04 VITALS — BP 122/65 | HR 62 | Temp 98.3°F | Ht 70.0 in | Wt 201.4 lb

## 2023-06-04 DIAGNOSIS — K2270 Barrett's esophagus without dysplasia: Secondary | ICD-10-CM

## 2023-06-04 DIAGNOSIS — Z8711 Personal history of peptic ulcer disease: Secondary | ICD-10-CM | POA: Diagnosis not present

## 2023-06-04 DIAGNOSIS — K5900 Constipation, unspecified: Secondary | ICD-10-CM | POA: Diagnosis not present

## 2023-06-04 DIAGNOSIS — K2100 Gastro-esophageal reflux disease with esophagitis, without bleeding: Secondary | ICD-10-CM | POA: Diagnosis not present

## 2023-06-04 NOTE — Patient Instructions (Signed)
Continue taking pantoprazole 40 mg twice daily 30 minutes before breakfast and dinner.  Continue to avoid NSAID products including ibuprofen, Aleve, Advil, BC powders, Goody powders, anything that says "NSAID" the package.  Start Benefiber 3 teaspoons daily x 2 weeks, then increase to twice daily to help with constipation.  If Benefiber alone is not allowing you to have productive bowel movements, start MiraLAX 1 capful (17 g) daily in 8 ounces of water or other noncarbonated beverage of your choice.  Try eating 4-6 small meals daily  Follow a low fat diet.   I will plan to see you back in about 3 months.  Do not hesitate to call sooner if you have questions or concerns.  It was a pleasure to see you today! I want to create trusting relationships with patients. If you receive a survey regarding your visit,  I greatly appreciate you taking time to fill this out on paper or through your MyChart. I value your feedback.  Ermalinda Memos, PA-C [facility]

## 2023-06-05 ENCOUNTER — Other Ambulatory Visit: Payer: Self-pay | Admitting: Internal Medicine

## 2023-06-05 DIAGNOSIS — D509 Iron deficiency anemia, unspecified: Secondary | ICD-10-CM

## 2023-06-06 ENCOUNTER — Other Ambulatory Visit: Payer: Self-pay

## 2023-06-06 MED ORDER — FERROUS SULFATE 325 (65 FE) MG PO TABS
325.0000 mg | ORAL_TABLET | Freq: Two times a day (BID) | ORAL | 0 refills | Status: DC
Start: 2023-06-06 — End: 2023-09-10
  Filled 2023-06-19: qty 180, 90d supply, fill #0

## 2023-06-06 MED ORDER — LISINOPRIL 20 MG PO TABS
20.0000 mg | ORAL_TABLET | Freq: Two times a day (BID) | ORAL | 0 refills | Status: DC
  Filled 2023-06-06: qty 180, 90d supply, fill #0

## 2023-06-07 ENCOUNTER — Encounter: Payer: Self-pay | Admitting: Internal Medicine

## 2023-06-14 ENCOUNTER — Inpatient Hospital Stay: Payer: Medicaid Other | Attending: Internal Medicine

## 2023-06-14 ENCOUNTER — Inpatient Hospital Stay: Payer: Medicaid Other | Admitting: Internal Medicine

## 2023-06-14 VITALS — BP 120/49 | HR 65 | Temp 98.2°F | Resp 17 | Ht 70.0 in | Wt 202.0 lb

## 2023-06-14 DIAGNOSIS — D500 Iron deficiency anemia secondary to blood loss (chronic): Secondary | ICD-10-CM

## 2023-06-14 DIAGNOSIS — K909 Intestinal malabsorption, unspecified: Secondary | ICD-10-CM | POA: Diagnosis not present

## 2023-06-14 DIAGNOSIS — D509 Iron deficiency anemia, unspecified: Secondary | ICD-10-CM | POA: Insufficient documentation

## 2023-06-14 DIAGNOSIS — D62 Acute posthemorrhagic anemia: Secondary | ICD-10-CM | POA: Diagnosis not present

## 2023-06-14 DIAGNOSIS — Z9884 Bariatric surgery status: Secondary | ICD-10-CM | POA: Diagnosis not present

## 2023-06-14 LAB — CBC WITH DIFFERENTIAL (CANCER CENTER ONLY)
Abs Immature Granulocytes: 0.02 10*3/uL (ref 0.00–0.07)
Basophils Absolute: 0 10*3/uL (ref 0.0–0.1)
Basophils Relative: 1 %
Eosinophils Absolute: 0.2 10*3/uL (ref 0.0–0.5)
Eosinophils Relative: 4 %
HCT: 37.3 % — ABNORMAL LOW (ref 39.0–52.0)
Hemoglobin: 12.2 g/dL — ABNORMAL LOW (ref 13.0–17.0)
Immature Granulocytes: 0 %
Lymphocytes Relative: 21 %
Lymphs Abs: 1.1 10*3/uL (ref 0.7–4.0)
MCH: 31 pg (ref 26.0–34.0)
MCHC: 32.7 g/dL (ref 30.0–36.0)
MCV: 94.7 fL (ref 80.0–100.0)
Monocytes Absolute: 0.5 10*3/uL (ref 0.1–1.0)
Monocytes Relative: 9 %
Neutro Abs: 3.6 10*3/uL (ref 1.7–7.7)
Neutrophils Relative %: 65 %
Platelet Count: 180 10*3/uL (ref 150–400)
RBC: 3.94 MIL/uL — ABNORMAL LOW (ref 4.22–5.81)
RDW: 14.7 % (ref 11.5–15.5)
WBC Count: 5.5 10*3/uL (ref 4.0–10.5)
nRBC: 0 % (ref 0.0–0.2)

## 2023-06-14 LAB — IRON AND IRON BINDING CAPACITY (CC-WL,HP ONLY)
Iron: 58 ug/dL (ref 45–182)
Saturation Ratios: 16 % — ABNORMAL LOW (ref 17.9–39.5)
TIBC: 354 ug/dL (ref 250–450)
UIBC: 296 ug/dL

## 2023-06-14 LAB — FERRITIN: Ferritin: 10 ng/mL — ABNORMAL LOW (ref 24–336)

## 2023-06-14 NOTE — Progress Notes (Signed)
[facility] [phone]   [phone]  OFFICE PROGRESS NOTE  Marcine Matar, MD [address] 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  PRIOR THERAPY: Iron infusion with Venofer 300 mg IV weekly for 3 weeks.  CURRENT THERAPY: Over-the-counter oral iron tablet with vitamin C twice daily.  INTERVAL HISTORY: Jeremy [name] 62 y.o. male returns to the clinic today for 4-month follow-up visit.  The patient is feeling fine today with no concerning complaints.  He had upper endoscopy on 04/25/2023 by Dr. Marletta Lor and that showed esophageal mucosal changes secondary to established long segment Barrett's disease.  There was also evidence of gastritis.  The patient denied having any current chest pain, shortness of breath, cough or hemoptysis.  He has no nausea, vomiting, diarrhea or constipation.  He has no headache or visual changes.  He continues to have mild bruises especially on the left forearm.  He is here today for evaluation and repeat blood work.  MEDICAL HISTORY: Past Medical History:  Diagnosis Date   Diabetes mellitus without complication (HCC)    GERD (gastroesophageal reflux disease)    Heart murmur    Hyperlipidemia    Hypertension    IDA (iron deficiency anemia)    Rash of entire body 03/2016   Sleep apnea     ALLERGIES:  is allergic to cymbalta [duloxetine hcl], doxycycline, other, and robaxin [methocarbamol].  MEDICATIONS:  Current Outpatient Medications  Medication Sig Dispense Refill   Accu-Chek Softclix Lancets lancets Use to check blood sugar three times daily. 100 each 2   acetaminophen (TYLENOL) 500 MG tablet Take 1,000 mg by mouth in the morning and at bedtime.      ascorbic acid (VITAMIN C) 500 MG tablet Take 500 mg by mouth 2 (two) times daily.     Blood Glucose Monitoring Suppl (ACCU-CHEK GUIDE) w/Device KIT Use to  check blood sugar three times daily. 1 kit 0   Cyanocobalamin (VITAMIN B-12 PO) Take 2,500 mcg by mouth daily.     cyclobenzaprine (FLEXERIL) 10 MG tablet Take 1 tablet (10 mg total) by mouth 2 (two) times daily as needed for muscle spasms. 180 tablet 0   ferrous sulfate (FEROSUL) 325 (65 FE) MG tablet Take 1 tablet (325 mg total) by mouth 2 (two) times daily with a meal. 180 tablet 0   fluticasone (FLONASE) 50 MCG/ACT nasal spray Place 1 spray into both nostrils daily as needed for allergies or rhinitis. 16 g 0   glipiZIDE (GLUCOTROL) 5 MG tablet Take 1 tablet (5 mg total) in the morning AND 1 tablet (5 mg total) every evening. (Patient taking differently: Take 1 tablet (5 mg total) in the morning depending on diet) 180 tablet 0   lisinopril (ZESTRIL) 20 MG tablet Take 1 tablet (20 mg total) by mouth in the morning and at bedtime. 180 tablet 0   loratadine (CLARITIN) 10 MG tablet Take 10 mg by mouth daily.     Magnesium Oxide 200 MG TABS Take 200 mg by mouth in the morning and at bedtime.     Multiple Vitamins-Minerals (MULTIVITAMIN WITH MINERALS) tablet Take 1 tablet by mouth 2 (two) times daily.     ondansetron (ZOFRAN) 8 MG tablet Take 1 tablet (8 mg total) by mouth daily as needed for nausea or vomiting. 30 tablet 2   pantoprazole (PROTONIX) 40 MG tablet Take 1 tablet (40 mg  total) by mouth 2 (two) times daily before a meal. 60 tablet 1   Potassium 99 MG TABS Take 198 mg by mouth 2 (two) times daily.     rOPINIRole (REQUIP) 1 MG tablet Take 1 tablet (1 mg total) by mouth at bedtime. 90 tablet 1   sildenafil (VIAGRA) 50 MG tablet Take 1 tablet (50 mg total) by mouth 1/2 hr before intercourse.  Limit use to 1 tab/24 hr 10 tablet 3   sucralfate (CARAFATE) 1 GM/10ML suspension Take 10 mLs (1 g total) by mouth every 8 (eight) hours. 420 mL 1   traMADol (ULTRAM) 50 MG tablet Take 2 tablets (100 mg total) by mouth every 12 (twelve) hours as needed (to fill on or after 05/20/2023). 120 tablet 2   No  current facility-administered medications for this visit.    SURGICAL HISTORY:  Past Surgical History:  Procedure Laterality Date   BACK SURGERY  1993   lumbar   BALLOON DILATION N/A 04/25/2023   Procedure: BALLOON DILATION;  Surgeon: Lanelle Bal, DO;  [facility]: [facility];  Service: Endoscopy;  Laterality: N/A;   BIOPSY  02/07/2019   Procedure: BIOPSY;  Surgeon: West Bali, MD;  Location: [facility];  Service: Endoscopy;;   BIOPSY  01/30/2022   Procedure: BIOPSY;  Surgeon: Lanelle Bal, DO;  Location: [facility];  Service: Endoscopy;;   BIOPSY  04/25/2023   Procedure: BIOPSY;  Surgeon: Lanelle Bal, DO;  [facility]: [facility];  Service: Endoscopy;;   CERVICAL SPINE SURGERY  2000   COLONOSCOPY N/A 02/07/2019   Dr. Darrick Penna: External hemorrhoids next colonoscopy in 10 years   ESOPHAGOGASTRODUODENOSCOPY N/A 02/07/2019   Dr. Darrick Penna: Barrett's esophagus without dysplasia chronic inactive gastritis but no H. 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, nonbleeding duodenal diverticulum,   ESOPHAGOGASTRODUODENOSCOPY N/A 01/05/2020   Procedure: ESOPHAGOGASTRODUODENOSCOPY (EGD);  Surgeon: West Bali, MD;  [facility]: [facility];  Service: Endoscopy;  Laterality: N/A;  10:30am   ESOPHAGOGASTRODUODENOSCOPY (EGD) WITH PROPOFOL N/A 12/06/2020   Procedure: ESOPHAGOGASTRODUODENOSCOPY (EGD) WITH PROPOFOL;  Surgeon: Lanelle Bal, DO;  [facility]: [facility];  Service: Endoscopy;  Laterality: N/A;  11:45am   ESOPHAGOGASTRODUODENOSCOPY (EGD) WITH PROPOFOL N/A 12/22/2021   long-segment Barrett's s/p biopsy, gastric bypass with normal sized puch and intact staple line. GJ anastomosis with healthy mucosa. Gastritis.   ESOPHAGOGASTRODUODENOSCOPY (EGD) WITH PROPOFOL N/A 01/30/2022   Procedure: ESOPHAGOGASTRODUODENOSCOPY (EGD) WITH PROPOFOL;  Surgeon: Lanelle Bal, DO;  [facility];  Service: Endoscopy;   Laterality: N/A;  11:00am   ESOPHAGOGASTRODUODENOSCOPY (EGD) WITH PROPOFOL N/A 03/11/2023   Procedure: ESOPHAGOGASTRODUODENOSCOPY (EGD) WITH PROPOFOL;  Surgeon: Benancio Deeds, MD;  Location: [facility];  Service: Gastroenterology;  Laterality: N/A;   ESOPHAGOGASTRODUODENOSCOPY (EGD) WITH PROPOFOL N/A 04/25/2023   Procedure: ESOPHAGOGASTRODUODENOSCOPY (EGD) WITH PROPOFOL;  Surgeon: Lanelle Bal, DO;  [facility];  Service: Endoscopy;  Laterality: N/A;  10:30 am, asa 3   GASTROJEJUNOSTOMY N/A 01/03/2021   Procedure: GASTROJEJUNOSTOMY;  Surgeon: [name], MD;  Location: [facility];  Service: General;  Laterality: N/A;   HAND SURGERY     SAVORY DILATION N/A 01/05/2020   Procedure: SAVORY DILATION;  Surgeon: West Bali, MD;  Location: [facility];  Service: Endoscopy;  Laterality: N/A;    REVIEW OF SYSTEMS:  A comprehensive review of systems was negative.   PHYSICAL EXAMINATION: General appearance: alert, cooperative, and no distress Head:  Normocephalic, without obvious abnormality, atraumatic Neck: no adenopathy, no JVD, supple, symmetrical, trachea midline, and thyroid not enlarged, symmetric, no tenderness/mass/nodules Lymph nodes: Cervical, supraclavicular, and axillary nodes normal. Resp: clear to auscultation bilaterally Back: symmetric, no curvature. ROM normal. No CVA tenderness. Cardio: regular rate and rhythm, S1, S2 normal, no murmur, click, rub or gallop GI: soft, non-tender; bowel sounds normal; no masses,  no organomegaly Extremities: extremities normal, atraumatic, no cyanosis or edema  ECOG PERFORMANCE STATUS: 1 - Symptomatic but completely ambulatory  Blood pressure (!) 120/49, pulse 65, temperature 98.2 F (36.8 C), temperature source Oral, resp. rate 17, height 5\' 10"  (1.778 m), weight 202 lb (91.6 kg), SpO2 99%.  LABORATORY DATA: Lab Results  Component Value Date   WBC 5.5 06/14/2023   HGB 12.2 (L) 06/14/2023   HCT 37.3 (L) 06/14/2023    MCV 94.7 06/14/2023   PLT 180 06/14/2023      Chemistry      Component Value Date/Time   NA 134 (L) 04/23/2023 1039   NA 138 05/17/2021 1657   K 3.7 04/23/2023 1039   CL 101 04/23/2023 1039   CO2 24 04/23/2023 1039   BUN 13 04/23/2023 1039   BUN 11 05/17/2021 1657   CREATININE 0.80 04/23/2023 1039   CREATININE 0.65 12/14/2022 0832   CREATININE 0.72 10/17/2016 1012      Component Value Date/Time   CALCIUM 9.2 04/23/2023 1039   ALKPHOS 51 03/11/2023 0543   AST 18 03/11/2023 0543   AST 24 12/14/2022 0832   ALT 23 03/11/2023 0543   ALT 21 12/14/2022 0832   BILITOT 0.7 03/11/2023 0543   BILITOT 0.2 (L) 12/14/2022 0832       RADIOGRAPHIC STUDIES: No results found.  ASSESSMENT AND PLAN: This is a very pleasant 62 years old white male with iron deficiency anemia secondary to malabsorption secondary to gastrojejunostomy in March 2022. The patient was treated with iron infusion with Venofer 300 mg IV for 3 doses.  Last dose was given in March 2024. The patient is currently on oral iron tablet and he is tolerating it fairly well. Repeat CBC today showed a stable hemoglobin of 12.2 and hematocrit 37.3% with normal total white blood count as well as platelet count.  Iron study and ferritin are still pending. I recommended for the patient to continue with the oral iron tablet for now.  If the pending iron study showed significant iron deficiency, I will arrange for him to receive iron infusion, otherwise he will come back for follow-up visit in 3 months with repeat CBC, iron study and ferritin. He was advised to call immediately if he has any concerning symptoms in the interval. All questions were answered. The patient knows to call the clinic with any problems, questions or concerns. We can certainly see the patient much sooner if necessary. The total time spent in the appointment was 20 minutes.  Disclaimer: This note was dictated with voice recognition software. Similar sounding  words can inadvertently be transcribed and may not be corrected upon review.

## 2023-06-15 ENCOUNTER — Other Ambulatory Visit: Payer: Self-pay

## 2023-06-15 ENCOUNTER — Encounter: Payer: Self-pay | Admitting: Internal Medicine

## 2023-06-16 ENCOUNTER — Other Ambulatory Visit: Payer: Self-pay | Admitting: Internal Medicine

## 2023-06-19 ENCOUNTER — Other Ambulatory Visit: Payer: Self-pay

## 2023-06-19 ENCOUNTER — Encounter: Payer: Self-pay | Admitting: Physician Assistant

## 2023-06-19 ENCOUNTER — Telehealth: Payer: Self-pay

## 2023-06-19 ENCOUNTER — Other Ambulatory Visit: Payer: Self-pay | Admitting: Internal Medicine

## 2023-06-19 ENCOUNTER — Encounter: Payer: Self-pay | Admitting: Internal Medicine

## 2023-06-19 DIAGNOSIS — D509 Iron deficiency anemia, unspecified: Secondary | ICD-10-CM

## 2023-06-19 DIAGNOSIS — J302 Other seasonal allergic rhinitis: Secondary | ICD-10-CM

## 2023-06-19 DIAGNOSIS — E119 Type 2 diabetes mellitus without complications: Secondary | ICD-10-CM

## 2023-06-19 MED ORDER — GLIPIZIDE 5 MG PO TABS
ORAL_TABLET | ORAL | 0 refills | Status: DC
Start: 2023-06-19 — End: 2023-09-07
  Filled 2023-06-19: qty 180, 90d supply, fill #0

## 2023-06-19 MED ORDER — ONDANSETRON HCL 8 MG PO TABS
8.0000 mg | ORAL_TABLET | Freq: Every day | ORAL | 2 refills | Status: DC | PRN
  Filled 2023-06-19: qty 30, 30d supply, fill #0
  Filled 2024-01-08: qty 30, 30d supply, fill #1
  Filled 2024-03-04: qty 30, 30d supply, fill #2

## 2023-06-19 MED ORDER — FLUTICASONE PROPIONATE 50 MCG/ACT NA SUSP
1.0000 | Freq: Every day | NASAL | 2 refills | Status: DC | PRN
Start: 2023-06-19 — End: 2024-06-08
  Filled 2023-06-19 – 2023-09-12 (×2): qty 16, 60d supply, fill #0
  Filled 2024-01-08: qty 16, 60d supply, fill #1
  Filled 2024-03-04: qty 16, 60d supply, fill #2

## 2023-06-19 NOTE — Telephone Encounter (Addendum)
Auth Submission: NO AUTH NEEDED Site of care: Site of care: [facility] Payer: Medicaid [employer] Medication & CPT/J Code(s) submitted: Venofer (Iron Sucrose) J1756  Auth type: Buy/Bill HB Units/visits requested: 300mg  once weekly x 3 doses  Approval from: 06/19/23 to 10/19/23

## 2023-06-20 ENCOUNTER — Other Ambulatory Visit: Payer: Self-pay

## 2023-06-20 ENCOUNTER — Encounter: Payer: Self-pay | Admitting: Internal Medicine

## 2023-06-20 NOTE — Addendum Note (Signed)
Addended by: Desma Mcgregor on: 06/20/2023 01:03 PM   Modules accepted: Orders

## 2023-06-25 ENCOUNTER — Other Ambulatory Visit: Payer: Self-pay

## 2023-06-27 ENCOUNTER — Other Ambulatory Visit: Payer: Self-pay | Admitting: Gastroenterology

## 2023-06-27 ENCOUNTER — Other Ambulatory Visit: Payer: Self-pay

## 2023-06-27 ENCOUNTER — Encounter: Payer: Self-pay | Admitting: Sports Medicine

## 2023-06-27 ENCOUNTER — Other Ambulatory Visit: Payer: Self-pay | Admitting: Sports Medicine

## 2023-06-27 DIAGNOSIS — G8929 Other chronic pain: Secondary | ICD-10-CM

## 2023-06-27 DIAGNOSIS — K2100 Gastro-esophageal reflux disease with esophagitis, without bleeding: Secondary | ICD-10-CM

## 2023-06-27 DIAGNOSIS — M12812 Other specific arthropathies, not elsewhere classified, left shoulder: Secondary | ICD-10-CM

## 2023-06-27 MED ORDER — PANTOPRAZOLE SODIUM 40 MG PO TBEC
40.0000 mg | DELAYED_RELEASE_TABLET | Freq: Two times a day (BID) | ORAL | 5 refills | Status: DC
Start: 2023-06-27 — End: 2023-10-17
  Filled 2023-06-27 – 2023-07-04 (×3): qty 60, 30d supply, fill #0
  Filled 2023-07-30: qty 60, 30d supply, fill #1
  Filled 2023-08-20 – 2023-08-22 (×2): qty 60, 30d supply, fill #2
  Filled 2023-10-03: qty 60, 30d supply, fill #3

## 2023-06-27 NOTE — Telephone Encounter (Signed)
Pt seen Jeremy [name] last and he is requesting a refill on his Pantoprazole. Pt was last seen in June of this year

## 2023-06-28 ENCOUNTER — Ambulatory Visit (INDEPENDENT_AMBULATORY_CARE_PROVIDER_SITE_OTHER): Payer: Medicaid Other

## 2023-06-28 VITALS — BP 124/66 | HR 56 | Temp 97.6°F | Resp 16 | Ht 70.0 in | Wt 204.0 lb

## 2023-06-28 DIAGNOSIS — D509 Iron deficiency anemia, unspecified: Secondary | ICD-10-CM | POA: Diagnosis not present

## 2023-06-28 MED ORDER — ACETAMINOPHEN 325 MG PO TABS: 650.0000 mg | ORAL_TABLET | Freq: Once | ORAL | Status: DC

## 2023-06-28 MED ORDER — DIPHENHYDRAMINE HCL 25 MG PO CAPS: 25.0000 mg | ORAL_CAPSULE | Freq: Once | ORAL | Status: DC

## 2023-06-28 MED ORDER — SODIUM CHLORIDE 0.9 % IV SOLN
300.0000 mg | INTRAVENOUS | Status: DC
  Administered 2023-06-28: 300 mg via INTRAVENOUS
  Filled 2023-06-28: qty 15

## 2023-06-28 NOTE — Progress Notes (Signed)
Diagnosis: Iron Deficiency Anemia  Provider:  Chilton Greathouse MD  Procedure: IV Infusion  IV Type: Peripheral, IV Location: R Antecubital  Venofer (Iron Sucrose), Dose: 300 mg  Infusion Start Time: 0936  Infusion Stop Time: 1115  Post Infusion IV Care: Patient declined observation and Peripheral IV Discontinued  Discharge: Condition: Good, Destination: Home . AVS Provided  Performed by:  Loney Hering, LPN

## 2023-07-03 ENCOUNTER — Other Ambulatory Visit: Payer: Self-pay

## 2023-07-04 ENCOUNTER — Other Ambulatory Visit: Payer: Self-pay

## 2023-07-05 ENCOUNTER — Other Ambulatory Visit: Payer: Self-pay

## 2023-07-05 ENCOUNTER — Ambulatory Visit (INDEPENDENT_AMBULATORY_CARE_PROVIDER_SITE_OTHER): Payer: Medicaid Other

## 2023-07-05 VITALS — BP 126/67 | HR 56 | Temp 97.8°F | Resp 18 | Ht 70.0 in | Wt 205.6 lb

## 2023-07-05 DIAGNOSIS — D509 Iron deficiency anemia, unspecified: Secondary | ICD-10-CM | POA: Diagnosis not present

## 2023-07-05 DIAGNOSIS — H903 Sensorineural hearing loss, bilateral: Secondary | ICD-10-CM | POA: Diagnosis not present

## 2023-07-05 MED ORDER — SODIUM CHLORIDE 0.9 % IV SOLN
300.0000 mg | INTRAVENOUS | Status: DC
  Administered 2023-07-05: 300 mg via INTRAVENOUS
  Filled 2023-07-05: qty 15

## 2023-07-05 MED ORDER — ACETAMINOPHEN 325 MG PO TABS: 650.0000 mg | ORAL_TABLET | Freq: Once | ORAL | Status: DC

## 2023-07-05 MED ORDER — DIPHENHYDRAMINE HCL 25 MG PO CAPS: 25.0000 mg | ORAL_CAPSULE | Freq: Once | ORAL | Status: DC

## 2023-07-05 NOTE — Progress Notes (Signed)
Diagnosis: Iron Deficiency Anemia  Provider:  Chilton Greathouse MD  Procedure: IV Infusion  IV Type: Peripheral, IV Location: L Forearm  Venofer (Iron Sucrose), Dose: 300 mg  Infusion Start Time: 0924  Infusion Stop Time: 1106  Post Infusion IV Care: Patient declined observation and Peripheral IV Discontinued  Discharge: Condition: Good, Destination: Home . AVS Declined  Performed by:  Adriana Mccallum, RN

## 2023-07-06 ENCOUNTER — Encounter: Payer: Self-pay | Admitting: Sports Medicine

## 2023-07-09 ENCOUNTER — Encounter: Payer: Self-pay | Admitting: Internal Medicine

## 2023-07-12 ENCOUNTER — Ambulatory Visit: Payer: Medicaid Other | Admitting: *Deleted

## 2023-07-12 VITALS — BP 133/65 | HR 67 | Temp 98.2°F | Resp 14 | Ht 70.0 in | Wt 200.6 lb

## 2023-07-12 DIAGNOSIS — D509 Iron deficiency anemia, unspecified: Secondary | ICD-10-CM | POA: Diagnosis not present

## 2023-07-12 MED ORDER — ACETAMINOPHEN 325 MG PO TABS: 650.0000 mg | ORAL_TABLET | Freq: Once | ORAL | Status: DC

## 2023-07-12 MED ORDER — SODIUM CHLORIDE 0.9 % IV SOLN
300.0000 mg | INTRAVENOUS | Status: DC
  Administered 2023-07-12: 300 mg via INTRAVENOUS
  Filled 2023-07-12: qty 15

## 2023-07-12 MED ORDER — DIPHENHYDRAMINE HCL 25 MG PO CAPS: 25.0000 mg | ORAL_CAPSULE | Freq: Once | ORAL | Status: DC

## 2023-07-12 NOTE — Progress Notes (Signed)
Diagnosis: Iron Deficiency Anemia  Provider:  Chilton Greathouse MD  Procedure: IV Infusion  IV Type: Peripheral, IV Location: R Upper Arm  Venofer (Iron Sucrose), Dose: 300 mg  Infusion Start Time: 0941 am  Infusion Stop Time: 1133 am  Post Infusion IV Care: Observation period completed and Peripheral IV Discontinued  Discharge: Condition: Good, Destination: Home . AVS Declined  Performed by:  Forrest Moron, RN

## 2023-07-16 DIAGNOSIS — G4733 Obstructive sleep apnea (adult) (pediatric): Secondary | ICD-10-CM | POA: Diagnosis not present

## 2023-07-18 ENCOUNTER — Encounter: Payer: Self-pay | Admitting: Physical Therapy

## 2023-07-18 ENCOUNTER — Ambulatory Visit: Payer: Medicaid Other | Attending: Sports Medicine | Admitting: Physical Therapy

## 2023-07-18 ENCOUNTER — Other Ambulatory Visit: Payer: Self-pay

## 2023-07-18 DIAGNOSIS — M25512 Pain in left shoulder: Secondary | ICD-10-CM | POA: Insufficient documentation

## 2023-07-18 DIAGNOSIS — M6281 Muscle weakness (generalized): Secondary | ICD-10-CM | POA: Insufficient documentation

## 2023-07-18 DIAGNOSIS — M12812 Other specific arthropathies, not elsewhere classified, left shoulder: Secondary | ICD-10-CM | POA: Insufficient documentation

## 2023-07-18 DIAGNOSIS — G8929 Other chronic pain: Secondary | ICD-10-CM | POA: Insufficient documentation

## 2023-07-18 NOTE — Therapy (Signed)
OUTPATIENT PHYSICAL THERAPY SHOULDER EVALUATION   Patient Name: Jeremy [name] MRN: [id number] DOB:22-Sep-1961, 62 y.o., male Today's Date: 07/18/2023   PT End of Session - 07/18/23 1035     Visit Number 1    Number of Visits --   1-2x/week   Date for PT Re-Evaluation 09/12/23    Authorization Type [employer] - FOTO    PT Start Time 1000    PT Stop Time 1040    PT Time Calculation (min) 40 min             Past Medical History:  Diagnosis Date   Diabetes mellitus without complication (HCC)    GERD (gastroesophageal reflux disease)    Heart murmur    Hyperlipidemia    Hypertension    IDA (iron deficiency anemia)    Rash of entire body 03/2016   Sleep apnea    Past Surgical History:  Procedure Laterality Date   BACK SURGERY  1993   lumbar   BALLOON DILATION N/A 04/25/2023   Procedure: BALLOON DILATION;  Surgeon: Lanelle Bal, DO;  [facility];  Service: Endoscopy;  Laterality: N/A;   BIOPSY  02/07/2019   Procedure: BIOPSY;  Surgeon: West Bali, MD;  [facility];  Service: Endoscopy;;   BIOPSY  01/30/2022   Procedure: BIOPSY;  Surgeon: Lanelle Bal, DO;  [facility];  Service: Endoscopy;;   BIOPSY  04/25/2023   Procedure: BIOPSY;  Surgeon: Lanelle Bal, DO;  [facility];  Service: Endoscopy;;   CERVICAL SPINE SURGERY  2000   COLONOSCOPY N/A 02/07/2019   Dr. Darrick Penna: External hemorrhoids next colonoscopy in 10 years   ESOPHAGOGASTRODUODENOSCOPY N/A 02/07/2019   Dr. Darrick Penna: Barrett's esophagus without dysplasia chronic inactive gastritis but no H. 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, nonbleeding duodenal diverticulum,   ESOPHAGOGASTRODUODENOSCOPY N/A 01/05/2020   Procedure: ESOPHAGOGASTRODUODENOSCOPY (EGD);  Surgeon: West Bali, MD;  [facility];  Service: Endoscopy;  Laterality: N/A;  10:30am   ESOPHAGOGASTRODUODENOSCOPY (EGD)  WITH PROPOFOL N/A 12/06/2020   Procedure: ESOPHAGOGASTRODUODENOSCOPY (EGD) WITH PROPOFOL;  Surgeon: Lanelle Bal, DO;  [facility];  Service: Endoscopy;  Laterality: N/A;  11:45am   ESOPHAGOGASTRODUODENOSCOPY (EGD) WITH PROPOFOL N/A 12/22/2021   long-segment Barrett's s/p biopsy, gastric bypass with normal sized puch and intact staple line. GJ anastomosis with healthy mucosa. Gastritis.   ESOPHAGOGASTRODUODENOSCOPY (EGD) WITH PROPOFOL N/A 01/30/2022   Procedure: ESOPHAGOGASTRODUODENOSCOPY (EGD) WITH PROPOFOL;  Surgeon: Lanelle Bal, DO;  [facility];  Service: Endoscopy;  Laterality: N/A;  11:00am   ESOPHAGOGASTRODUODENOSCOPY (EGD) WITH PROPOFOL N/A 03/11/2023   Procedure: ESOPHAGOGASTRODUODENOSCOPY (EGD) WITH PROPOFOL;  Surgeon: Benancio Deeds, MD;  Location: [facility];  Service: Gastroenterology;  Laterality: N/A;   ESOPHAGOGASTRODUODENOSCOPY (EGD) WITH PROPOFOL N/A 04/25/2023   Procedure: ESOPHAGOGASTRODUODENOSCOPY (EGD) WITH PROPOFOL;  Surgeon: Lanelle Bal, DO;  [facility];  Service: Endoscopy;  Laterality: N/A;  10:30 am, asa 3   GASTROJEJUNOSTOMY N/A 01/03/2021   Procedure: GASTROJEJUNOSTOMY;  Surgeon: [name], MD;  Location: [facility];  Service: General;  Laterality: N/A;   HAND SURGERY     SAVORY DILATION N/A 01/05/2020   Procedure: SAVORY DILATION;  Surgeon: West Bali, MD;  [facility];  Service: Endoscopy;  Laterality: N/A;   Patient Active Problem List   Diagnosis Date Noted   Anastomotic stricture of gastrojejunostomy 04/07/2023  PUD (peptic ulcer disease) 04/02/2023   Anemia 04/02/2023   Marginal ulcer 03/12/2023   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   Coffee ground emesis 03/11/2023   NSAID long-term use 03/11/2023   Anemia, posthemorrhagic, acute 03/11/2023   Upper GI bleeding 03/10/2023   Hypertension associated with type 2 diabetes mellitus (HCC) 01/15/2023   Coronary artery disease of  native artery of native heart with stable angina pectoris (HCC) 01/15/2023   Constipation 10/25/2022   Upper abdominal pain 07/06/2022   Nausea and vomiting 07/06/2022   Hyponatremia 06/23/2021   Low serum vitamin B12 03/23/2021   Vitamin D deficiency 03/23/2021   S/P bypass gastrojejunostomy 01/03/2021   Gastric outlet obstruction    Duodenal stricture    Primary osteoarthritis of both hips 11/15/2020   Aortic atherosclerosis (HCC) 08/30/2020   Coronary artery calcification 08/30/2020   Bilateral carpal tunnel syndrome 06/07/2020   Grief reaction 06/07/2020   Pyloric stenosis in adult 02/05/2020   Congenital hypertrophic pyloric stenosis    Duodenal web    Barrett's esophagus without dysplasia 05/19/2019   OSA (obstructive sleep apnea) 04/30/2019   Iron deficiency anemia 11/25/2018   Phimosis of penis 11/19/2018   Esophageal dysphagia 08/19/2018   Benign prostatic hyperplasia with post-void dribbling 01/31/2018   Absence of bladder continence 01/31/2018   Obesity (BMI 30-39.9) 11/05/2017   GERD (gastroesophageal reflux disease) 06/03/2017   Chronic pain of both knees 06/03/2017   Chronic lower back pain 06/03/2017   Type 2 diabetes mellitus without complication, without long-term current use of insulin (HCC) 04/12/2016   Essential hypertension 04/12/2016    PCP: Marcine Matar, MD  REFERRING PROVIDER: Madelyn Brunner, DO  THERAPY DIAG:  Left shoulder pain, unspecified chronicity - Plan: PT plan of care cert/re-cert  Muscle weakness - Plan: PT plan of care cert/re-cert  REFERRING DIAG: Chronic left shoulder pain [M25.512, G89.29], Rotator cuff arthropathy of left shoulder [M12.812]   Rationale for Evaluation and Treatment:  Rehabilitation  SUBJECTIVE:  PERTINENT PAST HISTORY:  DM TII, HTN, CAD      PRECAUTIONS: None  WEIGHT BEARING RESTRICTIONS No  FALLS:  Has patient fallen in last 6 months? Yes, Number of falls: 1x "my feet tangled up in something and I  fell" no injury noted  MOI/History of condition:  Onset date: 5-6 months  SUBJECTIVE STATEMENT  Jeremy [name] is a 62 y.o. male who presents to clinic with chief complaint of L shoulder pain.  Pt states that he was was driving a piece of pipe under a sidewalk and had delayed onset of pain.  He had a sudden force after catching a 5 gallon bottle of water and "it felt like it tore something".  He had a steroid injection which nearly eliminated the pain.  He caught a chandelier with his L hand and feels he re-injured it.  "It hurts worse than it ever did"   Red flags:  denies  Pain:  Are you having pain? 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  Assistive Device: na  Higher education careers adviser Dominance: L  Patient Goals/Specific Activities: reduce pain and improve function on arm   OBJECTIVE:    DIAGNOSTIC FINDINGS:  No MRI  GENERAL OBSERVATION:  NA  SENSATION:  Light touch: Appears intact   PALPATION: Minimal TTP infra and supra spinatus, TTP LH biceps tendon  UPPER EXTREMITY AROM:  ROM Right (Eval) Left (Eval)  Shoulder flexion 170 70*  Shoulder abduction 170 90*  Shoulder internal rotation    Shoulder external rotation    Functional IR n N*  Functional ER t5 Occiput*  Shoulder extension    Elbow extension    Elbow flexion     (Blank rows = not tested, N = WNL, * = concordant pain with testing)  UPPER EXTREMITY MMT:  MMT Right (Eval) Left (Eval)  Shoulder flexion 4+ 3*  Shoulder abduction (C5) 4+ 3*  Shoulder ER 4 3*  Shoulder IR n n  Middle trapezius    Lower trapezius    Shoulder extension    Grip strength 80 lbs 80 lbs  Cervical  flexion (C1,C2)    Cervical S/B (C3)    Shoulder shrug (C4)    Elbow flexion (C6)  4+  Elbow ext (C7)    Thumb ext (C8)    Finger abd (T1)    Grossly     (Blank rows = not tested, score listed is out of 5 possible points.  N = WNL, D = diminished, C = clear for gross weakness with myotome testing, * = concordant pain with testing)   MUSCLE LENGTH:    NT  UPPER EXTREMITY PROM:  PROM Right (Eval) Left (Eval)  Shoulder flexion    Shoulder abduction    Shoulder internal rotation    Shoulder external rotation    Functional IR    Functional ER    Shoulder extension    Elbow extension    Elbow flexion     (Blank rows = not tested, N = WNL, * = concordant pain with testing)  SPECIAL TESTS:  R/C test cluster (+)  JOINT MOBILITY TESTING:  NT  PATIENT SURVEYS:  FOTO 41 -> 56    TODAY'S TREATMENT:  Creating, reviewing, and completing below HEP   PATIENT EDUCATION:  POC, diagnosis, prognosis, HEP, and outcome measures.  Pt educated via explanation, demonstration, and handout (HEP).  Pt confirms understanding verbally.   HOME EXERCISE PROGRAM: Access Code: [email or web address] URL: [email or web address] Date: 07/18/2023 Prepared by: Alphonzo Severance  Exercises - Standing Isometric Shoulder External Rotation with Doorway  - 2-3 x daily - 7 x weekly - 1 sets - 10 reps - 5 hold - Standing Isometric Shoulder Flexion with Doorway - Arm Bent  - 2-3 x daily - 7 x weekly - 1 sets - 10 reps - 5 second hold  Treatment priorities   Eval        Progressive R/C strengthening         TDN?                                  ASSESSMENT:  CLINICAL IMPRESSION: Rashee is a 62 y.o. male who presents to clinic with signs and sxs consistent with L shoulder pain secondary to R/C pathology.  Ruling up R/C with (+) test cluster.  Cx ruled down d/t mechanism of injury, sxs, and previous resolution of sxs with SA steroid injection.  Pt will benefit from skilled therapy to address  relevant deficits and improve disability and function.    OBJECTIVE IMPAIRMENTS: Pain, L shoulder ROM and strength  ACTIVITY LIMITATIONS: reaching, working, housework, yardwork, lifting   PERSONAL FACTORS: See medical history and pertinent history  REHAB POTENTIAL: Good  CLINICAL DECISION MAKING: Stable/uncomplicated  EVALUATION COMPLEXITY: Low   GOALS:   SHORT TERM GOALS: Target date: 08/15/2023   August will be >75% HEP compliant to improve carryover between sessions and facilitate independent management of condition  Evaluation: ongoing Goal status: INITIAL   LONG TERM GOALS: Target date: 09/12/2023   Lyman will improve FOTO score to 56 as a proxy for functional improvement  Evaluation/Baseline: 41 Goal status: INITIAL    2.  Ashad will self report >/= 50% decrease in pain from evaluation   Evaluation/Baseline: 9/10 max pain Goal status: INITIAL   3.  Allah will improve the following MMTs to >/= 4/5 to show improvement in strength:  shoulder flexion and ER   Evaluation/Baseline: see chart in note Goal status: INITIAL   4.  Jakevious will be able to return to work with minimal restrictions, not limited by pain  Evaluation/Baseline: limited Goal status: INITIAL   5.  Abdinasir will demonstrate >130 degrees of active ROM in flexion to allow completion of activities involving reaching OH, not limited by pain  Evaluation/Baseline: 70 degrees w/ pain Goal status: INITIAL    PLAN: PT FREQUENCY: 1-2x/week  PT DURATION: 8 weeks  PLANNED INTERVENTIONS: Therapeutic exercises, Aquatic therapy, Therapeutic activity, Neuro Muscular re-education, Gait training, Patient/Family education, Joint mobilization, Dry Needling, Electrical stimulation, Spinal mobilization and/or manipulation, Moist heat, Taping, Vasopneumatic device, Ionotophoresis 4mg /ml Dexamethasone, and Manual therapy   Alphonzo Severance PT, DPT 07/18/2023, 10:45 AM

## 2023-07-23 ENCOUNTER — Other Ambulatory Visit: Payer: Self-pay

## 2023-07-24 ENCOUNTER — Other Ambulatory Visit: Payer: Self-pay

## 2023-07-24 ENCOUNTER — Telehealth: Payer: Self-pay

## 2023-07-24 NOTE — Telephone Encounter (Signed)
Pharmacy Patient Advocate Encounter  Received notification from [facility] that Prior Authorization for TRAMADOL has been APPROVED from 07/24/2023 to 01/20/2024   PA #/Case ID/Reference #: [id number]

## 2023-07-25 ENCOUNTER — Other Ambulatory Visit: Payer: Self-pay

## 2023-07-25 ENCOUNTER — Ambulatory Visit: Payer: Medicaid Other

## 2023-07-25 DIAGNOSIS — G8929 Other chronic pain: Secondary | ICD-10-CM | POA: Diagnosis not present

## 2023-07-25 DIAGNOSIS — M6281 Muscle weakness (generalized): Secondary | ICD-10-CM | POA: Diagnosis not present

## 2023-07-25 DIAGNOSIS — M25512 Pain in left shoulder: Secondary | ICD-10-CM | POA: Diagnosis not present

## 2023-07-25 DIAGNOSIS — M12812 Other specific arthropathies, not elsewhere classified, left shoulder: Secondary | ICD-10-CM | POA: Diagnosis not present

## 2023-07-25 NOTE — Therapy (Signed)
OUTPATIENT PHYSICAL THERAPY TREATMENT NOTE   Patient Name: Jeremy [name] MRN: [id number] DOB:1961-03-12, 62 y.o., male Today's Date: 07/25/2023   PT End of Session - 07/25/23 1737     Visit Number 2    Date for PT Re-Evaluation 09/12/23    Authorization Type [employer] - FOTO    Authorization Time Period 7 visits 07/24/23-09/21/23    Authorization - Visit Number 1    Authorization - Number of Visits 7    PT Start Time 1740    PT Stop Time 1830    PT Time Calculation (min) 50 min    Activity Tolerance Patient tolerated treatment well    Behavior During Therapy [facility] for tasks assessed/performed              Past Medical History:  Diagnosis Date   Diabetes mellitus without complication (HCC)    GERD (gastroesophageal reflux disease)    Heart murmur    Hyperlipidemia    Hypertension    IDA (iron deficiency anemia)    Rash of entire body 03/2016   Sleep apnea    Past Surgical History:  Procedure Laterality Date   BACK SURGERY  1993   lumbar   BALLOON DILATION N/A 04/25/2023   Procedure: BALLOON DILATION;  Surgeon: Lanelle Bal, DO;  [facility];  Service: Endoscopy;  Laterality: N/A;   BIOPSY  02/07/2019   Procedure: BIOPSY;  Surgeon: West Bali, MD;  [facility];  Service: Endoscopy;;   BIOPSY  01/30/2022   Procedure: BIOPSY;  Surgeon: Lanelle Bal, DO;  [facility];  Service: Endoscopy;;   BIOPSY  04/25/2023   Procedure: BIOPSY;  Surgeon: Lanelle Bal, DO;  [facility];  Service: Endoscopy;;   CERVICAL SPINE SURGERY  2000   COLONOSCOPY N/A 02/07/2019   Dr. Darrick Penna: External hemorrhoids next colonoscopy in 10 years   ESOPHAGOGASTRODUODENOSCOPY N/A 02/07/2019   Dr. Darrick Penna: Barrett's esophagus without dysplasia chronic inactive gastritis but no H. 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, nonbleeding duodenal diverticulum,    ESOPHAGOGASTRODUODENOSCOPY N/A 01/05/2020   Procedure: ESOPHAGOGASTRODUODENOSCOPY (EGD);  Surgeon: West Bali, MD;  [facility];  Service: Endoscopy;  Laterality: N/A;  10:30am   ESOPHAGOGASTRODUODENOSCOPY (EGD) WITH PROPOFOL N/A 12/06/2020   Procedure: ESOPHAGOGASTRODUODENOSCOPY (EGD) WITH PROPOFOL;  Surgeon: Lanelle Bal, DO;  [facility];  Service: Endoscopy;  Laterality: N/A;  11:45am   ESOPHAGOGASTRODUODENOSCOPY (EGD) WITH PROPOFOL N/A 12/22/2021   long-segment Barrett's s/p biopsy, gastric bypass with normal sized puch and intact staple line. GJ anastomosis with healthy mucosa. Gastritis.   ESOPHAGOGASTRODUODENOSCOPY (EGD) WITH PROPOFOL N/A 01/30/2022   Procedure: ESOPHAGOGASTRODUODENOSCOPY (EGD) WITH PROPOFOL;  Surgeon: Lanelle Bal, DO;  [facility];  Service: Endoscopy;  Laterality: N/A;  11:00am   ESOPHAGOGASTRODUODENOSCOPY (EGD) WITH PROPOFOL N/A 03/11/2023   Procedure: ESOPHAGOGASTRODUODENOSCOPY (EGD) WITH PROPOFOL;  Surgeon: Benancio Deeds, MD;  [facility] ENDOSCOPY;  Service: Gastroenterology;  Laterality: N/A;   ESOPHAGOGASTRODUODENOSCOPY (EGD) WITH PROPOFOL N/A 04/25/2023   Procedure: ESOPHAGOGASTRODUODENOSCOPY (EGD) WITH PROPOFOL;  Surgeon: Lanelle Bal, DO;  [facility];  Service: Endoscopy;  Laterality: N/A;  10:30 am, asa 3   GASTROJEJUNOSTOMY N/A 01/03/2021   Procedure: GASTROJEJUNOSTOMY;  Surgeon: [name], MD;  Location: [facility];  Service: General;  Laterality: N/A;   HAND SURGERY     SAVORY DILATION N/A 01/05/2020   Procedure: SAVORY DILATION;  Surgeon: West Bali, MD;  [facility];  Service: Endoscopy;  Laterality: N/A;   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   Coffee ground  emesis 03/11/2023   NSAID long-term use 03/11/2023   Anemia, posthemorrhagic, acute 03/11/2023   Upper GI bleeding 03/10/2023   Hypertension associated with type 2 diabetes mellitus (HCC) 01/15/2023   Coronary artery disease of native artery of native heart with stable angina pectoris (HCC) 01/15/2023   Constipation 10/25/2022   Upper abdominal pain 07/06/2022   Nausea and vomiting 07/06/2022   Hyponatremia 06/23/2021   Low serum vitamin B12 03/23/2021   Vitamin D deficiency 03/23/2021   S/P bypass gastrojejunostomy 01/03/2021   Gastric outlet obstruction    Duodenal stricture    Primary osteoarthritis of both hips 11/15/2020   Aortic atherosclerosis (HCC) 08/30/2020   Coronary artery calcification 08/30/2020   Bilateral carpal tunnel syndrome 06/07/2020   Grief reaction 06/07/2020   Pyloric stenosis in adult 02/05/2020   Congenital hypertrophic pyloric stenosis    Duodenal web    Barrett's esophagus without dysplasia 05/19/2019   OSA (obstructive sleep apnea) 04/30/2019   Iron deficiency anemia 11/25/2018   Phimosis of penis 11/19/2018   Esophageal dysphagia 08/19/2018   Benign prostatic hyperplasia with post-void dribbling 01/31/2018   Absence of bladder continence 01/31/2018   Obesity (BMI 30-39.9) 11/05/2017   GERD (gastroesophageal reflux disease) 06/03/2017   Chronic pain of both knees 06/03/2017   Chronic lower back pain 06/03/2017   Type 2 diabetes mellitus without complication, without long-term current use of insulin (HCC) 04/12/2016   Essential hypertension 04/12/2016    PCP: Marcine Matar, MD  REFERRING PROVIDER: Madelyn Brunner, DO  THERAPY DIAG:  Muscle weakness  Left shoulder pain, unspecified chronicity  REFERRING DIAG: Chronic left shoulder pain [M25.512, G89.29], Rotator cuff arthropathy of left shoulder [M12.812]   Rationale for Evaluation and Treatment:  Rehabilitation  SUBJECTIVE:  PERTINENT PAST HISTORY:  DM TII, HTN, CAD       PRECAUTIONS: None  WEIGHT BEARING RESTRICTIONS No  FALLS:  Has patient fallen in last 6 months? Yes, Number of falls: 1x "my feet tangled up in something and I fell" no injury noted  MOI/History of condition:  Onset date: 5-6 months  SUBJECTIVE STATEMENT Patient reports that his pain is at a 3/10 today and that the home exercises significantly increased his pain to a 7-8/10 and he wasn't able to sleep.    Red flags:  denies  Pain:  Are you having pain? 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  Assistive Device: na  Higher education careers adviser Dominance: L  Patient Goals/Specific Activities: reduce pain and improve function on arm   OBJECTIVE:    DIAGNOSTIC FINDINGS:  No MRI  GENERAL OBSERVATION:  NA     SENSATION:  Light touch: Appears intact   PALPATION: Minimal TTP infra and supra spinatus, TTP LH biceps tendon  UPPER EXTREMITY AROM:  ROM Right (Eval) Left (Eval)  Shoulder flexion 170 70*  Shoulder abduction 170 90*  Shoulder internal  rotation    Shoulder external rotation    Functional IR n N*  Functional ER t5 Occiput*  Shoulder extension    Elbow extension    Elbow flexion     (Blank rows = not tested, N = WNL, * = concordant pain with testing)  UPPER EXTREMITY MMT:  MMT Right (Eval) Left (Eval)  Shoulder flexion 4+ 3*  Shoulder abduction (C5) 4+ 3*  Shoulder ER 4 3*  Shoulder IR n n  Middle trapezius    Lower trapezius    Shoulder extension    Grip strength 80 lbs 80 lbs  Cervical flexion (C1,C2)    Cervical S/B (C3)    Shoulder shrug (C4)    Elbow flexion (C6)  4+  Elbow ext (C7)    Thumb ext (C8)    Finger abd (T1)     Grossly     (Blank rows = not tested, score listed is out of 5 possible points.  N = WNL, D = diminished, C = clear for gross weakness with myotome testing, * = concordant pain with testing)   MUSCLE LENGTH:    NT  UPPER EXTREMITY PROM:  PROM Right (Eval) Left (Eval)  Shoulder flexion    Shoulder abduction    Shoulder internal rotation    Shoulder external rotation    Functional IR    Functional ER    Shoulder extension    Elbow extension    Elbow flexion     (Blank rows = not tested, N = WNL, * = concordant pain with testing)  SPECIAL TESTS:  R/C test cluster (+)  JOINT MOBILITY TESTING:  NT  PATIENT SURVEYS:  FOTO 41 -> 56    TODAY'S TREATMENT:  Creating, reviewing, and completing below HEP   PATIENT EDUCATION:  POC, diagnosis, prognosis, HEP, and outcome measures.  Pt educated via explanation, demonstration, and handout (HEP).  Pt confirms understanding verbally.   HOME EXERCISE PROGRAM: Access Code: [email or web address] URL: [email or web address] Date: 07/18/2023 Prepared by: Alphonzo Severance  Exercises - Standing Isometric Shoulder External Rotation with Doorway  - 2-3 x daily - 7 x weekly - 1 sets - 10 reps - 5 hold - Standing Isometric Shoulder Flexion with Doorway - Arm Bent  - 2-3 x daily - 7 x weekly - 1 sets - 10 reps - 5 second hold  Treatment priorities   Eval        Progressive R/C strengthening         TDN?                                 TREATMENT  [facility] Adult PT Treatment:                                                DATE: 07/25/23 Therapeutic Exercise: Pulleys flexion x3' Rows RTB 2x10 Shoulder extension RTB 2x10 Seated double ER with scap retraction YTB (too painful) Seated scaption no weight x10 Supine shoulder flexion AAROM with dowel x10 Supine chest press with dowel x10 Sidelying trio: ER, abduction, flexion x15 ea Modalities: MHP to Lt shoulder, pt in sitting x 10 mins post session   ASSESSMENT:  CLINICAL  IMPRESSION: Patient presents to first follow up PT session reporting continued Lt shoulder pain and that he had a significant increase in  pain after performing HEP. Reviewed HEP with patient today, demonstrating understanding of the exercises. Session today focused on gentle RTC strengthening and shoulder ROM. He is most limited in ER due to sharp increase in pain. Patient continues to benefit from skilled PT services and should be progressed as able to improve functional independence.    OBJECTIVE IMPAIRMENTS: Pain, L shoulder ROM and strength  ACTIVITY LIMITATIONS: reaching, working, housework, Presenter, broadcasting, lifting   PERSONAL FACTORS: See medical history and pertinent history   REHAB POTENTIAL: Good  CLINICAL DECISION MAKING: Stable/uncomplicated  EVALUATION COMPLEXITY: Low   GOALS:   SHORT TERM GOALS: Target date: 08/15/2023   Kito will be >75% HEP compliant to improve carryover between sessions and facilitate independent management of condition  Evaluation: ongoing Goal status: INITIAL   LONG TERM GOALS: Target date: 09/12/2023   Malosi will improve FOTO score to 56 as a proxy for functional improvement  Evaluation/Baseline: 41 Goal status: INITIAL    2.  Jaser will self report >/= 50% decrease in pain from evaluation   Evaluation/Baseline: 9/10 max pain Goal status: INITIAL   3.  Navi will improve the following MMTs to >/= 4/5 to show improvement in strength:  shoulder flexion and ER   Evaluation/Baseline: see chart in note Goal status: INITIAL   4.  Kristoffer will be able to return to work with minimal restrictions, not limited by pain  Evaluation/Baseline: limited Goal status: INITIAL   5.  Kowen will demonstrate >130 degrees of active ROM in flexion to allow completion of activities involving reaching OH, not limited by pain  Evaluation/Baseline: 70 degrees w/ pain Goal status: INITIAL    PLAN: PT FREQUENCY: 1-2x/week  PT DURATION: 8 weeks  PLANNED  INTERVENTIONS: Therapeutic exercises, Aquatic therapy, Therapeutic activity, Neuro Muscular re-education, Gait training, Patient/Family education, Joint mobilization, Dry Needling, Electrical stimulation, Spinal mobilization and/or manipulation, Moist heat, Taping, Vasopneumatic device, Ionotophoresis 4mg /ml Dexamethasone, and Manual therapy  PLAN FOR NEXT SESSION: Continue R/C strengthening and shoulder ROM  Berta Minor PTA 07/25/2023, 6:27 PM

## 2023-07-26 ENCOUNTER — Ambulatory Visit: Payer: Medicaid Other | Admitting: Sports Medicine

## 2023-07-26 ENCOUNTER — Encounter: Payer: Self-pay | Admitting: Sports Medicine

## 2023-07-26 DIAGNOSIS — M12812 Other specific arthropathies, not elsewhere classified, left shoulder: Secondary | ICD-10-CM

## 2023-07-26 DIAGNOSIS — G8929 Other chronic pain: Secondary | ICD-10-CM

## 2023-07-26 DIAGNOSIS — M75102 Unspecified rotator cuff tear or rupture of left shoulder, not specified as traumatic: Secondary | ICD-10-CM

## 2023-07-26 DIAGNOSIS — M25512 Pain in left shoulder: Secondary | ICD-10-CM | POA: Diagnosis not present

## 2023-07-26 NOTE — Progress Notes (Signed)
Jeremy [name] - 62 y.o. male MRN [id number]  Date of birth: 05/05/1961  Office Visit Note: Visit Date: 07/26/2023 PCP: Marcine Matar, MD Referred by: Marcine Matar, MD  Subjective: Chief Complaint  Patient presents with   Left Shoulder - Pain   HPI: Jeremy [name] is a pleasant 62 y.o. male who presents today for chronic left shoulder pain.  Has had left shoulder pain since earlier this year.  We did perform an ultrasound-guided glenohumeral joint injection on 05/08/2023 which a few weeks after this felt like his pain improved by 98-99%.  He did have some hiccups following this procedure.  He had been doing well until about 3 weeks ago when he was working with a chandelier when he reached out to catch it and felt a sharp stabbing pain deep within his shoulder.  His pain has been very bothersome since then and he has weakness in the arm.  He has been undergoing formalized physical therapy - states that therapy/home exercises do increase his pain.  He is on tramadol 100 mg every 12 hours as needed which she receives from his primary care physician.   Pertinent ROS were reviewed with the patient and found to be negative unless otherwise specified above in HPI.   Assessment & Plan: Visit Diagnoses:  1. Tear of left supraspinatus tendon   2. Chronic left shoulder pain   3. Rotator cuff arthropathy of left shoulder    Plan: [name] is dealing with acute on chronic left shoulder pain.  He had received excellent relief of his shoulder pain status post our injection almost 3 months ago, but unfortunately 3 weeks ago he had an incident with an outstretched arm dropping a chandelier that likely caused a tear of his rotator cuff, his exam points to the supraspinatus, possibly infraspinatus.  At this point, we need an MRI to dilate the rotator cuff and the degree of his likely tearing.  He may continue PT in the meantime as his pain allows.  He will follow-up 1 week after MRI.  Okay for Tylenol  and his chronic tramadol for pain control.  Follow-up: Return in about 1 week (around 08/02/2023) for after MRI shoulder.   Meds & Orders: No orders of the defined types were placed in this encounter.   Orders Placed This Encounter  Procedures   MR Shoulder Left w/o contrast     Procedures: No procedures performed      Clinical History: No specialty comments available.  He reports that he quit smoking about 34 years ago. His smoking use included cigarettes. He has never used smokeless tobacco.  Recent Labs    09/14/22 1656 03/11/23 0058  HGBA1C 4.9 4.6*    Objective:    Physical Exam  Gen: Well-appearing, in no acute distress; non-toxic CV: Well-perfused. Warm.  Resp: Breathing unlabored on room air; no wheezing. Psych: Fluid speech in conversation; appropriate affect; normal thought process Neuro: Sensation intact throughout. No gross coordination deficits.   Ortho Exam - Left shoulder: + TTP at Codman's point.  There is full passive range of motion, he has difficulty with endrange abduction and forward flexion actively.  There are no mechanical blocks to range of motion.  Positive drop arm test, positive empty can test, positive external rotation with weakness with ER and abduction.  Imaging:  N/a   Past Medical/Family/Surgical/Social History: Medications & Allergies reviewed per EMR, new medications updated. Patient Active Problem List   Diagnosis Date Noted   Anastomotic stricture  of gastrojejunostomy 04/07/2023   PUD (peptic ulcer disease) 04/02/2023   Anemia 04/02/2023   Marginal ulcer 03/12/2023   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   Coffee ground emesis 03/11/2023   NSAID long-term use 03/11/2023   Anemia, posthemorrhagic, acute 03/11/2023   Upper GI bleeding 03/10/2023   Hypertension associated with type 2 diabetes mellitus (HCC) 01/15/2023   Coronary artery disease of native artery of native heart with stable angina pectoris (HCC) 01/15/2023    Constipation 10/25/2022   Upper abdominal pain 07/06/2022   Nausea and vomiting 07/06/2022   Hyponatremia 06/23/2021   Low serum vitamin B12 03/23/2021   Vitamin D deficiency 03/23/2021   S/P bypass gastrojejunostomy 01/03/2021   Gastric outlet obstruction    Duodenal stricture    Primary osteoarthritis of both hips 11/15/2020   Aortic atherosclerosis (HCC) 08/30/2020   Coronary artery calcification 08/30/2020   Bilateral carpal tunnel syndrome 06/07/2020   Grief reaction 06/07/2020   Pyloric stenosis in adult 02/05/2020   Congenital hypertrophic pyloric stenosis    Duodenal web    Barrett's esophagus without dysplasia 05/19/2019   OSA (obstructive sleep apnea) 04/30/2019   Iron deficiency anemia 11/25/2018   Phimosis of penis 11/19/2018   Esophageal dysphagia 08/19/2018   Benign prostatic hyperplasia with post-void dribbling 01/31/2018   Absence of bladder continence 01/31/2018   Obesity (BMI 30-39.9) 11/05/2017   GERD (gastroesophageal reflux disease) 06/03/2017   Chronic pain of both knees 06/03/2017   Chronic lower back pain 06/03/2017   Type 2 diabetes mellitus without complication, without long-term current use of insulin (HCC) 04/12/2016   Essential hypertension 04/12/2016   Past Medical History:  Diagnosis Date   Diabetes mellitus without complication (HCC)    GERD (gastroesophageal reflux disease)    Heart murmur    Hyperlipidemia    Hypertension    IDA (iron deficiency anemia)    Rash of entire body 03/2016   Sleep apnea    Family History  Problem Relation Age of Onset   Breast cancer Mother    Pancreatic cancer Mother    CAD Father    Colon cancer Paternal Grandfather    Past Surgical History:  Procedure Laterality Date   BACK SURGERY  1993   lumbar   BALLOON DILATION N/A 04/25/2023   Procedure: BALLOON DILATION;  Surgeon: Lanelle Bal, DO;  [facility];  Service: Endoscopy;  Laterality: N/A;   BIOPSY  02/07/2019   Procedure: BIOPSY;   Surgeon: West Bali, MD;  [facility];  Service: Endoscopy;;   BIOPSY  01/30/2022   Procedure: BIOPSY;  Surgeon: Lanelle Bal, DO;  [facility];  Service: Endoscopy;;   BIOPSY  04/25/2023   Procedure: BIOPSY;  Surgeon: Lanelle Bal, DO;  [facility];  Service: Endoscopy;;   CERVICAL SPINE SURGERY  2000   COLONOSCOPY N/A 02/07/2019   Dr. Darrick Penna: External hemorrhoids next colonoscopy in 10 years   ESOPHAGOGASTRODUODENOSCOPY N/A 02/07/2019   Dr. Darrick Penna: Barrett's esophagus without dysplasia chronic inactive gastritis but no H. 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, nonbleeding duodenal diverticulum,   ESOPHAGOGASTRODUODENOSCOPY N/A 01/05/2020   Procedure: ESOPHAGOGASTRODUODENOSCOPY (EGD);  Surgeon: West Bali, MD;  [facility];  Service: Endoscopy;  Laterality: N/A;  10:30am   ESOPHAGOGASTRODUODENOSCOPY (EGD) WITH PROPOFOL N/A 12/06/2020   Procedure: ESOPHAGOGASTRODUODENOSCOPY (EGD) WITH PROPOFOL;  Surgeon: Lanelle Bal, DO;  [facility];  Service: Endoscopy;  Laterality:  N/A;  11:45am   ESOPHAGOGASTRODUODENOSCOPY (EGD) WITH PROPOFOL N/A 12/22/2021   long-segment Barrett's s/p biopsy, gastric bypass with normal sized puch and intact staple line. GJ anastomosis with healthy mucosa. Gastritis.   ESOPHAGOGASTRODUODENOSCOPY (EGD) WITH PROPOFOL N/A 01/30/2022   Procedure: ESOPHAGOGASTRODUODENOSCOPY (EGD) WITH PROPOFOL;  Surgeon: Lanelle Bal, DO;  [facility];  Service: Endoscopy;  Laterality: N/A;  11:00am   ESOPHAGOGASTRODUODENOSCOPY (EGD) WITH PROPOFOL N/A 03/11/2023   Procedure: ESOPHAGOGASTRODUODENOSCOPY (EGD) WITH PROPOFOL;  Surgeon: Benancio Deeds, MD;  Location[facility];  Service: Gastroenterology;  Laterality: N/A;   ESOPHAGOGASTRODUODENOSCOPY (EGD) WITH PROPOFOL N/A 04/25/2023   Procedure: ESOPHAGOGASTRODUODENOSCOPY (EGD) WITH  PROPOFOL;  Surgeon: Lanelle Bal, DO;  [facility];  Service: Endoscopy;  Laterality: N/A;  10:30 am, asa 3   GASTROJEJUNOSTOMY N/A 01/03/2021   Procedure: GASTROJEJUNOSTOMY;  Surgeon: [name], MD;  Location: [facility];  Service: General;  Laterality: N/A;   HAND SURGERY     SAVORY DILATION N/A 01/05/2020   Procedure: SAVORY DILATION;  Surgeon: West Bali, MD;  [facility];  Service: Endoscopy;  Laterality: N/A;   Social History   Occupational History   Occupation: Product/process development scientist  Tobacco Use   Smoking status: Former    Current packs/day: 0.00    Types: Cigarettes    Quit date: 10/16/1988    Years since quitting: 34.7   Smokeless tobacco: Never  Vaping Use   Vaping status: Never Used  Substance and Sexual Activity   Alcohol use: No   Drug use: Yes    Comment: rare   Sexual activity: Not on file

## 2023-07-27 ENCOUNTER — Encounter: Payer: Self-pay | Admitting: Internal Medicine

## 2023-07-30 ENCOUNTER — Other Ambulatory Visit: Payer: Self-pay | Admitting: Internal Medicine

## 2023-08-01 ENCOUNTER — Other Ambulatory Visit: Payer: Self-pay | Admitting: Internal Medicine

## 2023-08-01 ENCOUNTER — Other Ambulatory Visit: Payer: Self-pay

## 2023-08-01 ENCOUNTER — Ambulatory Visit: Payer: Medicaid Other

## 2023-08-01 ENCOUNTER — Encounter: Payer: Self-pay | Admitting: Physician Assistant

## 2023-08-01 DIAGNOSIS — M6281 Muscle weakness (generalized): Secondary | ICD-10-CM

## 2023-08-01 DIAGNOSIS — G8929 Other chronic pain: Secondary | ICD-10-CM | POA: Diagnosis not present

## 2023-08-01 DIAGNOSIS — M25512 Pain in left shoulder: Secondary | ICD-10-CM

## 2023-08-01 DIAGNOSIS — M12812 Other specific arthropathies, not elsewhere classified, left shoulder: Secondary | ICD-10-CM | POA: Diagnosis not present

## 2023-08-01 MED ORDER — AMLODIPINE BESYLATE 5 MG PO TABS
5.0000 mg | ORAL_TABLET | Freq: Every day | ORAL | 1 refills | Status: DC
  Filled 2023-08-01: qty 90, 90d supply, fill #0
  Filled 2023-10-30: qty 90, 90d supply, fill #1

## 2023-08-01 NOTE — Therapy (Signed)
OUTPATIENT PHYSICAL THERAPY TREATMENT NOTE   Patient Name: Jeremy [name] MRN: [id number] DOB:[birth year], 62 y.o., male Today's Date: 08/01/2023   PT End of Session - 08/01/23 1057     Visit Number 3    Date for PT Re-Evaluation 09/12/23    Authorization Type [employer] - FOTO    Authorization Time Period 7 visits 07/24/23-09/21/23    Authorization - Visit Number 2    Authorization - Number of Visits 7    PT Start Time 1056   Pt arrived late   PT Stop Time 1138    PT Time Calculation (min) 42 min    Activity Tolerance Patient tolerated treatment well    Behavior During Therapy [facility] for tasks assessed/performed             Past Medical History:  Diagnosis Date   Diabetes mellitus without complication (HCC)    GERD (gastroesophageal reflux disease)    Heart murmur    Hyperlipidemia    Hypertension    IDA (iron deficiency anemia)    Rash of entire body 03/2016   Sleep apnea    Past Surgical History:  Procedure Laterality Date   BACK SURGERY  1993   lumbar   BALLOON DILATION N/A 04/25/2023   Procedure: BALLOON DILATION;  Surgeon: Lanelle Bal, DO;  [facility];  Service: Endoscopy;  Laterality: N/A;   BIOPSY  02/07/2019   Procedure: BIOPSY;  Surgeon: West Bali, MD;  [facility];  Service: Endoscopy;;   BIOPSY  01/30/2022   Procedure: BIOPSY;  Surgeon: Lanelle Bal, DO;  [facility];  Service: Endoscopy;;   BIOPSY  04/25/2023   Procedure: BIOPSY;  Surgeon: Lanelle Bal, DO;  [facility];  Service: Endoscopy;;   CERVICAL SPINE SURGERY  2000   COLONOSCOPY N/A 02/07/2019   Dr. Darrick Penna: External hemorrhoids next colonoscopy in 10 years   ESOPHAGOGASTRODUODENOSCOPY N/A 02/07/2019   Dr. Darrick Penna: Barrett's esophagus without dysplasia chronic inactive gastritis but no H. 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, nonbleeding duodenal diverticulum,    ESOPHAGOGASTRODUODENOSCOPY N/A 01/05/2020   Procedure: ESOPHAGOGASTRODUODENOSCOPY (EGD);  Surgeon: West Bali, MD;  [facility];  Service: Endoscopy;  Laterality: N/A;  10:30am   ESOPHAGOGASTRODUODENOSCOPY (EGD) WITH PROPOFOL N/A 12/06/2020   Procedure: ESOPHAGOGASTRODUODENOSCOPY (EGD) WITH PROPOFOL;  Surgeon: Lanelle Bal, DO;  [facility];  Service: Endoscopy;  Laterality: N/A;  11:45am   ESOPHAGOGASTRODUODENOSCOPY (EGD) WITH PROPOFOL N/A 12/22/2021   long-segment Barrett's s/p biopsy, gastric bypass with normal sized puch and intact staple line. GJ anastomosis with healthy mucosa. Gastritis.   ESOPHAGOGASTRODUODENOSCOPY (EGD) WITH PROPOFOL N/A 01/30/2022   Procedure: ESOPHAGOGASTRODUODENOSCOPY (EGD) WITH PROPOFOL;  Surgeon: Lanelle Bal, DO;  [facility];  Service: Endoscopy;  Laterality: N/A;  11:00am   ESOPHAGOGASTRODUODENOSCOPY (EGD) WITH PROPOFOL N/A 03/11/2023   Procedure: ESOPHAGOGASTRODUODENOSCOPY (EGD) WITH PROPOFOL;  Surgeon: Benancio Deeds, MD;  Location: [facility];  Service: Gastroenterology;  Laterality: N/A;   ESOPHAGOGASTRODUODENOSCOPY (EGD) WITH PROPOFOL N/A 04/25/2023   Procedure: ESOPHAGOGASTRODUODENOSCOPY (EGD) WITH PROPOFOL;  Surgeon: Lanelle Bal, DO;  [facility];  Service: Endoscopy;  Laterality: N/A;  10:30 am, asa 3   GASTROJEJUNOSTOMY N/A 01/03/2021   Procedure: GASTROJEJUNOSTOMY;  Surgeon: [name], MD;  Location: [facility];  Service: General;  Laterality: N/A;   HAND SURGERY     SAVORY DILATION N/A 01/05/2020  Procedure: SAVORY DILATION;  Surgeon: West Bali, MD;  [facility];  Service: Endoscopy;  Laterality: N/A;   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   Coffee ground  emesis 03/11/2023   NSAID long-term use 03/11/2023   Anemia, posthemorrhagic, acute 03/11/2023   Upper GI bleeding 03/10/2023   Hypertension associated with type 2 diabetes mellitus (HCC) 01/15/2023   Coronary artery disease of native artery of native heart with stable angina pectoris (HCC) 01/15/2023   Constipation 10/25/2022   Upper abdominal pain 07/06/2022   Nausea and vomiting 07/06/2022   Hyponatremia 06/23/2021   Low serum vitamin B12 03/23/2021   Vitamin D deficiency 03/23/2021   S/P bypass gastrojejunostomy 01/03/2021   Gastric outlet obstruction    Duodenal stricture    Primary osteoarthritis of both hips 11/15/2020   Aortic atherosclerosis (HCC) 08/30/2020   Coronary artery calcification 08/30/2020   Bilateral carpal tunnel syndrome 06/07/2020   Grief reaction 06/07/2020   Pyloric stenosis in adult 02/05/2020   Congenital hypertrophic pyloric stenosis    Duodenal web    Barrett's esophagus without dysplasia 05/19/2019   OSA (obstructive sleep apnea) 04/30/2019   Iron deficiency anemia 11/25/2018   Phimosis of penis 11/19/2018   Esophageal dysphagia 08/19/2018   Benign prostatic hyperplasia with post-void dribbling 01/31/2018   Absence of bladder continence 01/31/2018   Obesity (BMI 30-39.9) 11/05/2017   GERD (gastroesophageal reflux disease) 06/03/2017   Chronic pain of both knees 06/03/2017   Chronic lower back pain 06/03/2017   Type 2 diabetes mellitus without complication, without long-term current use of insulin (HCC) 04/12/2016   Essential hypertension 04/12/2016    PCP: Marcine Matar, MD  REFERRING PROVIDER: Madelyn Brunner, DO  THERAPY DIAG:  Muscle weakness  Left shoulder pain, unspecified chronicity  REFERRING DIAG: Chronic left shoulder pain [M25.512, G89.29], Rotator cuff arthropathy of left shoulder [M12.812]   Rationale for Evaluation and Treatment:  Rehabilitation  SUBJECTIVE:  PERTINENT PAST HISTORY:  DM TII, HTN, CAD       PRECAUTIONS: None  WEIGHT BEARING RESTRICTIONS No  FALLS:  Has patient fallen in last 6 months? Yes, Number of falls: 1x "my feet tangled up in something and I fell" no injury noted  MOI/History of condition:  Onset date: 5-6 months  SUBJECTIVE STATEMENT Patient reports that he saw his orthro doc and that they have determined he has torn his supraspinatus and he is scheduled to receive an MRI soon. His pain is minimal today and he feels like PT has been helpful so far.    Red flags:  denies  Pain:  Are you having pain? Yes Pain location: anterior/lateral L shoulder with radiating pain to just below elbow NPRS scale:  1/10 Aggravating factors: lifting arm, lifting heavy objects Relieving factors: rest, ice Pain description: sharp and aching Stage: Chronic 24 hour pattern: increased pain at night, worse with activity   Occupation: works on Media planner and some physical jobs  Assistive Device: na  Higher education careers adviser Dominance: L  Patient Goals/Specific Activities: reduce pain and improve function on arm   OBJECTIVE:    DIAGNOSTIC FINDINGS:  No MRI  GENERAL OBSERVATION:  NA     SENSATION:  Light touch: Appears intact   PALPATION: Minimal TTP infra and supra spinatus, TTP LH biceps tendon  UPPER EXTREMITY AROM:  ROM Right (  Eval) Left (Eval)  Shoulder flexion 170 70*  Shoulder abduction 170 90*  Shoulder internal rotation    Shoulder external rotation    Functional IR n N*  Functional ER t5 Occiput*  Shoulder extension    Elbow extension    Elbow flexion     (Blank rows = not tested, N = WNL, * = concordant pain with testing)  UPPER EXTREMITY MMT:  MMT Right (Eval) Left (Eval)  Shoulder flexion 4+ 3*  Shoulder abduction (C5) 4+ 3*  Shoulder ER 4 3*  Shoulder IR n n  Middle trapezius    Lower trapezius    Shoulder extension    Grip strength 80 lbs 80 lbs  Cervical flexion (C1,C2)    Cervical S/B (C3)    Shoulder shrug (C4)    Elbow flexion (C6)  4+   Elbow ext (C7)    Thumb ext (C8)    Finger abd (T1)    Grossly     (Blank rows = not tested, score listed is out of 5 possible points.  N = WNL, D = diminished, C = clear for gross weakness with myotome testing, * = concordant pain with testing)   MUSCLE LENGTH:    NT  UPPER EXTREMITY PROM:  PROM Right (Eval) Left (Eval)  Shoulder flexion    Shoulder abduction    Shoulder internal rotation    Shoulder external rotation    Functional IR    Functional ER    Shoulder extension    Elbow extension    Elbow flexion     (Blank rows = not tested, N = WNL, * = concordant pain with testing)  SPECIAL TESTS:  R/C test cluster (+)  JOINT MOBILITY TESTING:  NT  PATIENT SURVEYS:  FOTO 41 -> 56    TODAY'S TREATMENT:  Creating, reviewing, and completing below HEP   PATIENT EDUCATION:  POC, diagnosis, prognosis, HEP, and outcome measures.  Pt educated via explanation, demonstration, and handout (HEP).  Pt confirms understanding verbally.   HOME EXERCISE PROGRAM: Access Code: [email or web address] URL: [email or web address] Date: 07/18/2023 Prepared by: Alphonzo Severance  Exercises - Standing Isometric Shoulder External Rotation with Doorway  - 2-3 x daily - 7 x weekly - 1 sets - 10 reps - 5 hold - Standing Isometric Shoulder Flexion with Doorway - Arm Bent  - 2-3 x daily - 7 x weekly - 1 sets - 10 reps - 5 second hold  Treatment priorities   Eval        Progressive R/C strengthening         TDN?                                 TREATMENT [facility] Adult PT Treatment:                                                DATE: 08/01/23 Therapeutic Exercise: Pulleys flexion x3' Rows RTB 2x10 Shoulder extension RTB 2x10 Seated scaption no weight x10 Supine shoulder flexion AAROM with dowel x10 Supine chest press with dowel x10 Sidelying trio: ER, abduction, flexion x15 ea Modalities: MHP to Lt shoulder, pt in sitting x 10 mins post session   [facility] Adult PT Treatment:  DATE: 07/25/23 Therapeutic Exercise: Pulleys flexion x3' Rows RTB 2x10 Shoulder extension RTB 2x10 Seated double ER with scap retraction YTB (too painful) Seated scaption no weight x10 Supine shoulder flexion AAROM with dowel x10 Supine chest press with dowel x10 Sidelying trio: ER, abduction, flexion x15 ea Modalities: MHP to Lt shoulder, pt in sitting x 10 mins post session   ASSESSMENT:  CLINICAL IMPRESSION: Patient presents to PT reporting continued shoulder pain, though lessened today, and that his ortho MD has determined he has a supraspinatus tear in the Lt shoulder. He is scheduled to received an MRI on 11/4 and in the meantime he would like to continued with PT as he feels it has been helpful for his ROM and function. Continued to focus on ROM and gentle RTC/periscapular strengthening as able within his pain tolerance. Patient continues to benefit from skilled PT services and should be progressed as able to improve functional independence.    OBJECTIVE IMPAIRMENTS: Pain, L shoulder ROM and strength  ACTIVITY LIMITATIONS: reaching, working, housework, Presenter, broadcasting, lifting   PERSONAL FACTORS: See medical history and pertinent history   REHAB POTENTIAL: Good  CLINICAL DECISION MAKING: Stable/uncomplicated  EVALUATION COMPLEXITY: Low   GOALS:   SHORT TERM GOALS: Target date: 08/15/2023   Attikus will be >75% HEP compliant to improve carryover between sessions and facilitate independent management of condition  Evaluation: ongoing Goal status: INITIAL   LONG TERM GOALS: Target date: 09/12/2023   Keyan will improve FOTO score to 56 as a proxy for functional improvement  Evaluation/Baseline: 41 Goal status: INITIAL    2.  Garvis will self report >/= 50% decrease in pain from evaluation   Evaluation/Baseline: 9/10 max pain Goal status: INITIAL   3.  Jiovanny will improve the following MMTs to >/= 4/5 to show improvement in  strength:  shoulder flexion and ER   Evaluation/Baseline: see chart in note Goal status: INITIAL   4.  Zekiah will be able to return to work with minimal restrictions, not limited by pain  Evaluation/Baseline: limited Goal status: INITIAL   5.  Skanda will demonstrate >130 degrees of active ROM in flexion to allow completion of activities involving reaching OH, not limited by pain  Evaluation/Baseline: 70 degrees w/ pain Goal status: INITIAL    PLAN: PT FREQUENCY: 1-2x/week  PT DURATION: 8 weeks  PLANNED INTERVENTIONS: Therapeutic exercises, Aquatic therapy, Therapeutic activity, Neuro Muscular re-education, Gait training, Patient/Family education, Joint mobilization, Dry Needling, Electrical stimulation, Spinal mobilization and/or manipulation, Moist heat, Taping, Vasopneumatic device, Ionotophoresis 4mg /ml Dexamethasone, and Manual therapy  PLAN FOR NEXT SESSION: Continue R/C strengthening and shoulder ROM   Berta Minor PTA 08/01/2023, 11:28 AM

## 2023-08-02 ENCOUNTER — Other Ambulatory Visit: Payer: Self-pay

## 2023-08-08 ENCOUNTER — Other Ambulatory Visit: Payer: Self-pay

## 2023-08-08 ENCOUNTER — Ambulatory Visit: Payer: Medicaid Other | Admitting: Physical Therapy

## 2023-08-08 ENCOUNTER — Encounter: Payer: Self-pay | Admitting: Physical Therapy

## 2023-08-08 DIAGNOSIS — M6281 Muscle weakness (generalized): Secondary | ICD-10-CM

## 2023-08-08 DIAGNOSIS — M25512 Pain in left shoulder: Secondary | ICD-10-CM

## 2023-08-08 DIAGNOSIS — G8929 Other chronic pain: Secondary | ICD-10-CM | POA: Diagnosis not present

## 2023-08-08 DIAGNOSIS — M12812 Other specific arthropathies, not elsewhere classified, left shoulder: Secondary | ICD-10-CM | POA: Diagnosis not present

## 2023-08-08 NOTE — Therapy (Signed)
OUTPATIENT PHYSICAL THERAPY TREATMENT NOTE   Patient Name: [name] MRN: [id number] DOB:25-Apr-1961, 62 y.o., male Today's Date: 08/08/2023   PT End of Session - 08/08/23 1001     Visit Number 4    Date for PT Re-Evaluation 09/12/23    Authorization Type [employer] - FOTO    Authorization Time Period 7 visits 07/24/23-09/21/23    Authorization - Visit Number 3    Authorization - Number of Visits 7    PT Start Time 1001    PT Stop Time 1042    PT Time Calculation (min) 41 min    Activity Tolerance Patient tolerated treatment well    Behavior During Therapy WFL for tasks assessed/performed             Past Medical History:  Diagnosis Date   Diabetes mellitus without complication (HCC)    GERD (gastroesophageal reflux disease)    Heart murmur    Hyperlipidemia    Hypertension    IDA (iron deficiency anemia)    Rash of entire body 03/2016   Sleep apnea    Past Surgical History:  Procedure Laterality Date   BACK SURGERY  1993   lumbar   BALLOON DILATION N/A 04/25/2023   Procedure: BALLOON DILATION;  Surgeon: Lanelle Bal, DO;  [facility]: [facility];  Service: Endoscopy;  Laterality: N/A;   BIOPSY  02/07/2019   Procedure: BIOPSY;  Surgeon: West Bali, MD;  Location: [facility];  Service: Endoscopy;;   BIOPSY  01/30/2022   Procedure: BIOPSY;  Surgeon: Lanelle Bal, DO;  Location: [facility];  Service: Endoscopy;;   BIOPSY  04/25/2023   Procedure: BIOPSY;  Surgeon: Lanelle Bal, DO;  [facility]: [facility];  Service: Endoscopy;;   CERVICAL SPINE SURGERY  2000   COLONOSCOPY N/A 02/07/2019   Dr. Darrick Penna: External hemorrhoids next colonoscopy in 10 years   ESOPHAGOGASTRODUODENOSCOPY N/A 02/07/2019   Dr. Darrick Penna: Barrett's esophagus without dysplasia chronic inactive gastritis but no H. 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, nonbleeding duodenal diverticulum,    ESOPHAGOGASTRODUODENOSCOPY N/A 01/05/2020   Procedure: ESOPHAGOGASTRODUODENOSCOPY (EGD);  Surgeon: West Bali, MD;  [facility]: [facility];  Service: Endoscopy;  Laterality: N/A;  10:30am   ESOPHAGOGASTRODUODENOSCOPY (EGD) WITH PROPOFOL N/A 12/06/2020   Procedure: ESOPHAGOGASTRODUODENOSCOPY (EGD) WITH PROPOFOL;  Surgeon: Lanelle Bal, DO;  [facility]: [facility];  Service: Endoscopy;  Laterality: N/A;  11:45am   ESOPHAGOGASTRODUODENOSCOPY (EGD) WITH PROPOFOL N/A 12/22/2021   long-segment Barrett's s/p biopsy, gastric bypass with normal sized puch and intact staple line. GJ anastomosis with healthy mucosa. Gastritis.   ESOPHAGOGASTRODUODENOSCOPY (EGD) WITH PROPOFOL N/A 01/30/2022   Procedure: ESOPHAGOGASTRODUODENOSCOPY (EGD) WITH PROPOFOL;  Surgeon: Lanelle Bal, DO;  [facility];  Service: Endoscopy;  Laterality: N/A;  11:00am   ESOPHAGOGASTRODUODENOSCOPY (EGD) WITH PROPOFOL N/A 03/11/2023   Procedure: ESOPHAGOGASTRODUODENOSCOPY (EGD) WITH PROPOFOL;  Surgeon: Benancio Deeds, MD;  Location: [facility];  Service: Gastroenterology;  Laterality: N/A;   ESOPHAGOGASTRODUODENOSCOPY (EGD) WITH PROPOFOL N/A 04/25/2023   Procedure: ESOPHAGOGASTRODUODENOSCOPY (EGD) WITH PROPOFOL;  Surgeon: Lanelle Bal, DO;  [facility];  Service: Endoscopy;  Laterality: N/A;  10:30 am, asa 3   GASTROJEJUNOSTOMY N/A 01/03/2021   Procedure: GASTROJEJUNOSTOMY;  Surgeon: [name], MD;  Location: [facility];  Service: General;  Laterality: N/A;   HAND SURGERY     SAVORY DILATION N/A 01/05/2020   Procedure: SAVORY DILATION;  Surgeon: West Bali, MD;  Location: [facility];  Service: Endoscopy;  Laterality: N/A;   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   Coffee ground  emesis 03/11/2023   NSAID long-term use 03/11/2023   Anemia, posthemorrhagic, acute 03/11/2023   Upper GI bleeding 03/10/2023   Hypertension associated with type 2 diabetes mellitus (HCC) 01/15/2023   Coronary artery disease of native artery of native heart with stable angina pectoris (HCC) 01/15/2023   Constipation 10/25/2022   Upper abdominal pain 07/06/2022   Nausea and vomiting 07/06/2022   Hyponatremia 06/23/2021   Low serum vitamin B12 03/23/2021   Vitamin D deficiency 03/23/2021   S/P bypass gastrojejunostomy 01/03/2021   Gastric outlet obstruction    Duodenal stricture    Primary osteoarthritis of both hips 11/15/2020   Aortic atherosclerosis (HCC) 08/30/2020   Coronary artery calcification 08/30/2020   Bilateral carpal tunnel syndrome 06/07/2020   Grief reaction 06/07/2020   Pyloric stenosis in adult 02/05/2020   Congenital hypertrophic pyloric stenosis    Duodenal web    Barrett's esophagus without dysplasia 05/19/2019   OSA (obstructive sleep apnea) 04/30/2019   Iron deficiency anemia 11/25/2018   Phimosis of penis 11/19/2018   Esophageal dysphagia 08/19/2018   Benign prostatic hyperplasia with post-void dribbling 01/31/2018   Absence of bladder continence 01/31/2018   Obesity (BMI 30-39.9) 11/05/2017   GERD (gastroesophageal reflux disease) 06/03/2017   Chronic pain of both knees 06/03/2017   Chronic lower back pain 06/03/2017   Type 2 diabetes mellitus without complication, without long-term current use of insulin (HCC) 04/12/2016   Essential hypertension 04/12/2016    PCP: Marcine Matar, MD  REFERRING PROVIDER: Madelyn Brunner, DO  THERAPY DIAG:  Muscle weakness  Left shoulder pain, unspecified chronicity  REFERRING DIAG: Chronic left shoulder pain [M25.512, G89.29], Rotator cuff arthropathy of left shoulder [M12.812]   Rationale for Evaluation and Treatment:  Rehabilitation  SUBJECTIVE:  PERTINENT PAST HISTORY:  DM TII, HTN, CAD       PRECAUTIONS: None  WEIGHT BEARING RESTRICTIONS No  FALLS:  Has patient fallen in last 6 months? Yes, Number of falls: 1x "my feet tangled up in something and I fell" no injury noted  MOI/History of condition:  Onset date: 5-6 months  SUBJECTIVE STATEMENT Pt reports that his shoulder has been stiff and sore all week.  He does feel that PT has been helpful.  He reports a pain reduction overall.  He used his weed eater this weekend and he feels this likely agged his sxs.   Red flags:  denies  Pain:  Are you having pain? 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  Assistive Device: na  Higher education careers adviser Dominance: L  Patient Goals/Specific Activities: reduce pain and improve function on arm   OBJECTIVE:    DIAGNOSTIC FINDINGS:  No MRI  GENERAL OBSERVATION:  NA     SENSATION:  Light touch: Appears intact   PALPATION: Minimal TTP infra and supra spinatus, TTP LH biceps tendon  UPPER EXTREMITY AROM:  ROM Right (Eval)  Left (Eval)  Shoulder flexion 170 70*  Shoulder abduction 170 90*  Shoulder internal rotation    Shoulder external rotation    Functional IR n N*  Functional ER t5 Occiput*  Shoulder extension    Elbow extension    Elbow flexion     (Blank rows = not tested, N = WNL, * = concordant pain with testing)  UPPER EXTREMITY MMT:  MMT Right (Eval) Left (Eval)  Shoulder flexion 4+ 3*  Shoulder abduction (C5) 4+ 3*  Shoulder ER 4 3*  Shoulder IR n n  Middle trapezius    Lower trapezius    Shoulder extension    Grip strength 80 lbs 80 lbs  Cervical flexion (C1,C2)    Cervical S/B (C3)    Shoulder shrug (C4)    Elbow flexion (C6)  4+   Elbow ext (C7)    Thumb ext (C8)    Finger abd (T1)    Grossly     (Blank rows = not tested, score listed is out of 5 possible points.  N = WNL, D = diminished, C = clear for gross weakness with myotome testing, * = concordant pain with testing)   MUSCLE LENGTH:    NT  UPPER EXTREMITY PROM:  PROM Right (Eval) Left (Eval)  Shoulder flexion    Shoulder abduction    Shoulder internal rotation    Shoulder external rotation    Functional IR    Functional ER    Shoulder extension    Elbow extension    Elbow flexion     (Blank rows = not tested, N = WNL, * = concordant pain with testing)  SPECIAL TESTS:  R/C test cluster (+)  JOINT MOBILITY TESTING:  NT  PATIENT SURVEYS:  FOTO 41 -> 56    TODAY'S TREATMENT:  Creating, reviewing, and completing below HEP   PATIENT EDUCATION:  POC, diagnosis, prognosis, HEP, and outcome measures.  Pt educated via explanation, demonstration, and handout (HEP).  Pt confirms understanding verbally.   HOME EXERCISE PROGRAM: Access Code: [email or web address] URL: [email or web address] Date: 07/18/2023 Prepared by: Alphonzo Severance  Exercises - Standing Isometric Shoulder External Rotation with Doorway  - 2-3 x daily - 7 x weekly - 1 sets - 10 reps - 5 hold - Standing Isometric Shoulder Flexion with Doorway - Arm Bent  - 2-3 x daily - 7 x weekly - 1 sets - 10 reps - 5 second hold  Treatment priorities   Eval        Progressive R/C strengthening         TDN?                                 TREATMENT [facility] Adult PT Treatment:                                                DATE: 08/08/23 Therapeutic Exercise: Pulleys flexion x3' UE ranger flexion 20x, scaption 20x - 24'' Isometric walk out - YTB - 2x8 Rows GTB 2x15 Shoulder extension GTB 2x15  Manual therapy: Skilled palpation to identify trigger points for TDN STM to all listed muscles following TDN  Trigger Point Dry-Needling  Treatment instructions: Expect mild to  moderate muscle soreness. S/S of pneumothorax if dry needled over a lung field, and to seek immediate  medical attention should they occur. Patient verbalized understanding of these instructions and education.  Patient Consent Given: Yes Education handout provided: No Muscles treated: L UT, L supraspinatus, L infraspinatus Electrical stimulation performed: No Parameters: N/A Treatment response/outcome: twitch    [facility] Adult PT Treatment:                                                DATE: 07/25/23 Therapeutic Exercise: Pulleys flexion x3' Rows RTB 2x10 Shoulder extension RTB 2x10 Seated double ER with scap retraction YTB (too painful) Seated scaption no weight x10 Supine shoulder flexion AAROM with dowel x10 Supine chest press with dowel x10 Sidelying trio: ER, abduction, flexion x15 ea Modalities: MHP to Lt shoulder, pt in sitting x 10 mins post session   ASSESSMENT:  CLINICAL IMPRESSION: Jeremy [name] tolerated session well with no adverse reaction.  Updated HEP to include isometric walkout and to limit pain to 3/10 or less.  Trialed TDN with no adverse reaction and will gauge medium term response next visit.   OBJECTIVE IMPAIRMENTS: Pain, L shoulder ROM and strength  ACTIVITY LIMITATIONS: reaching, working, housework, Presenter, broadcasting, lifting   PERSONAL FACTORS: See medical history and pertinent history   REHAB POTENTIAL: Good  CLINICAL DECISION MAKING: Stable/uncomplicated  EVALUATION COMPLEXITY: Low   GOALS:   SHORT TERM GOALS: Target date: 08/15/2023   Jeremy [name] will be >75% HEP compliant to improve carryover between sessions and facilitate independent management of condition  Evaluation: ongoing Goal status: INITIAL   LONG TERM GOALS: Target date: 09/12/2023   Jeremy [name] will improve FOTO score to 56 as a proxy for functional improvement  Evaluation/Baseline: 41 Goal status: INITIAL    2.  Jeremy [name] will self report >/= 50% decrease in pain from evaluation    Evaluation/Baseline: 9/10 max pain Goal status: INITIAL   3.  Jeremy [name] will improve the following MMTs to >/= 4/5 to show improvement in strength:  shoulder flexion and ER   Evaluation/Baseline: see chart in note Goal status: INITIAL   4.  Jeremy [name] will be able to return to work with minimal restrictions, not limited by pain  Evaluation/Baseline: limited Goal status: INITIAL   5.  Jeremy [name] will demonstrate >130 degrees of active ROM in flexion to allow completion of activities involving reaching OH, not limited by pain  Evaluation/Baseline: 70 degrees w/ pain Goal status: INITIAL    PLAN: PT FREQUENCY: 1-2x/week  PT DURATION: 8 weeks  PLANNED INTERVENTIONS: Therapeutic exercises, Aquatic therapy, Therapeutic activity, Neuro Muscular re-education, Gait training, Patient/Family education, Joint mobilization, Dry Needling, Electrical stimulation, Spinal mobilization and/or manipulation, Moist heat, Taping, Vasopneumatic device, Ionotophoresis 4mg /ml Dexamethasone, and Manual therapy  PLAN FOR NEXT SESSION: Continue R/C strengthening and shoulder ROM   Jeremy [name] PT 08/08/2023, 10:59 AM

## 2023-08-09 ENCOUNTER — Ambulatory Visit: Payer: Medicaid Other | Admitting: Internal Medicine

## 2023-08-10 ENCOUNTER — Encounter: Payer: Self-pay | Admitting: Sports Medicine

## 2023-08-15 ENCOUNTER — Ambulatory Visit: Payer: Medicaid Other | Admitting: Physical Therapy

## 2023-08-16 ENCOUNTER — Encounter: Payer: Self-pay | Admitting: Physical Therapy

## 2023-08-16 ENCOUNTER — Ambulatory Visit: Payer: Medicaid Other | Admitting: Physical Therapy

## 2023-08-16 DIAGNOSIS — M12812 Other specific arthropathies, not elsewhere classified, left shoulder: Secondary | ICD-10-CM | POA: Diagnosis not present

## 2023-08-16 DIAGNOSIS — M6281 Muscle weakness (generalized): Secondary | ICD-10-CM | POA: Diagnosis not present

## 2023-08-16 DIAGNOSIS — M25512 Pain in left shoulder: Secondary | ICD-10-CM

## 2023-08-16 DIAGNOSIS — G8929 Other chronic pain: Secondary | ICD-10-CM | POA: Diagnosis not present

## 2023-08-16 NOTE — Therapy (Signed)
OUTPATIENT PHYSICAL THERAPY TREATMENT NOTE   Patient Name: Jeremy [name] MRN: [id number] DOB:12/12/1960, 62 y.o., male Today's Date: 08/16/2023   PT End of Session - 08/16/23 1052     Visit Number 5    Date for PT Re-Evaluation 09/12/23    Authorization Type [employer] - FOTO    Authorization Time Period 7 visits 07/24/23-09/21/23    Authorization - Visit Number 4    Authorization - Number of Visits 7    PT Start Time 1052    PT Stop Time 1130    PT Time Calculation (min) 38 min    Activity Tolerance Patient tolerated treatment well    Behavior During Therapy WFL for tasks assessed/performed             Past Medical History:  Diagnosis Date   Diabetes mellitus without complication (HCC)    GERD (gastroesophageal reflux disease)    Heart murmur    Hyperlipidemia    Hypertension    IDA (iron deficiency anemia)    Rash of entire body 03/2016   Sleep apnea    Past Surgical History:  Procedure Laterality Date   BACK SURGERY  1993   lumbar   BALLOON DILATION N/A 04/25/2023   Procedure: BALLOON DILATION;  Surgeon: Lanelle Bal, DO;  [facility];  Service: Endoscopy;  Laterality: N/A;   BIOPSY  02/07/2019   Procedure: BIOPSY;  Surgeon: West Bali, MD;  [facility];  Service: Endoscopy;;   BIOPSY  01/30/2022   Procedure: BIOPSY;  Surgeon: Lanelle Bal, DO;  [facility];  Service: Endoscopy;;   BIOPSY  04/25/2023   Procedure: BIOPSY;  Surgeon: Lanelle Bal, DO;  [facility];  Service: Endoscopy;;   CERVICAL SPINE SURGERY  2000   COLONOSCOPY N/A 02/07/2019   Dr. Darrick Penna: External hemorrhoids next colonoscopy in 10 years   ESOPHAGOGASTRODUODENOSCOPY N/A 02/07/2019   Dr. Darrick Penna: Barrett's esophagus without dysplasia chronic inactive gastritis but no H. 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, nonbleeding duodenal diverticulum,    ESOPHAGOGASTRODUODENOSCOPY N/A 01/05/2020   Procedure: ESOPHAGOGASTRODUODENOSCOPY (EGD);  Surgeon: West Bali, MD;  [facility];  Service: Endoscopy;  Laterality: N/A;  10:30am   ESOPHAGOGASTRODUODENOSCOPY (EGD) WITH PROPOFOL N/A 12/06/2020   Procedure: ESOPHAGOGASTRODUODENOSCOPY (EGD) WITH PROPOFOL;  Surgeon: Lanelle Bal, DO;  [facility];  Service: Endoscopy;  Laterality: N/A;  11:45am   ESOPHAGOGASTRODUODENOSCOPY (EGD) WITH PROPOFOL N/A 12/22/2021   long-segment Barrett's s/p biopsy, gastric bypass with normal sized puch and intact staple line. GJ anastomosis with healthy mucosa. Gastritis.   ESOPHAGOGASTRODUODENOSCOPY (EGD) WITH PROPOFOL N/A 01/30/2022   Procedure: ESOPHAGOGASTRODUODENOSCOPY (EGD) WITH PROPOFOL;  Surgeon: Lanelle Bal, DO;  [facility];  Service: Endoscopy;  Laterality: N/A;  11:00am   ESOPHAGOGASTRODUODENOSCOPY (EGD) WITH PROPOFOL N/A 03/11/2023   Procedure: ESOPHAGOGASTRODUODENOSCOPY (EGD) WITH PROPOFOL;  Surgeon: Benancio Deeds, MD;  Location: [facility];  Service: Gastroenterology;  Laterality: N/A;   ESOPHAGOGASTRODUODENOSCOPY (EGD) WITH PROPOFOL N/A 04/25/2023   Procedure: ESOPHAGOGASTRODUODENOSCOPY (EGD) WITH PROPOFOL;  Surgeon: Lanelle Bal, DO;  [facility];  Service: Endoscopy;  Laterality: N/A;  10:30 am, asa 3   GASTROJEJUNOSTOMY N/A 01/03/2021   Procedure: GASTROJEJUNOSTOMY;  Surgeon: [name], MD;  Location: [facility];  Service: General;  Laterality: N/A;   HAND SURGERY     SAVORY DILATION N/A 01/05/2020   Procedure: SAVORY DILATION;  Surgeon: West Bali, MD;  [facility];  Service: Endoscopy;  Laterality: N/A;   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   Coffee ground  emesis 03/11/2023   NSAID long-term use 03/11/2023   Anemia, posthemorrhagic, acute 03/11/2023   Upper GI bleeding 03/10/2023   Hypertension associated with type 2 diabetes mellitus (HCC) 01/15/2023   Coronary artery disease of native artery of native heart with stable angina pectoris (HCC) 01/15/2023   Constipation 10/25/2022   Upper abdominal pain 07/06/2022   Nausea and vomiting 07/06/2022   Hyponatremia 06/23/2021   Low serum vitamin B12 03/23/2021   Vitamin D deficiency 03/23/2021   S/P bypass gastrojejunostomy 01/03/2021   Gastric outlet obstruction    Duodenal stricture    Primary osteoarthritis of both hips 11/15/2020   Aortic atherosclerosis (HCC) 08/30/2020   Coronary artery calcification 08/30/2020   Bilateral carpal tunnel syndrome 06/07/2020   Grief reaction 06/07/2020   Pyloric stenosis in adult 02/05/2020   Congenital hypertrophic pyloric stenosis    Duodenal web    Barrett's esophagus without dysplasia 05/19/2019   OSA (obstructive sleep apnea) 04/30/2019   Iron deficiency anemia 11/25/2018   Phimosis of penis 11/19/2018   Esophageal dysphagia 08/19/2018   Benign prostatic hyperplasia with post-void dribbling 01/31/2018   Absence of bladder continence 01/31/2018   Obesity (BMI 30-39.9) 11/05/2017   GERD (gastroesophageal reflux disease) 06/03/2017   Chronic pain of both knees 06/03/2017   Chronic lower back pain 06/03/2017   Type 2 diabetes mellitus without complication, without long-term current use of insulin (HCC) 04/12/2016   Essential hypertension 04/12/2016    PCP: Marcine Matar, MD  REFERRING PROVIDER: Marcine Matar, MD  THERAPY DIAG:  Muscle weakness  Left shoulder pain, unspecified chronicity  REFERRING DIAG: Chronic left shoulder pain [M25.512, G89.29], Rotator cuff arthropathy of left shoulder [M12.812]   Rationale for Evaluation and Treatment:  Rehabilitation  SUBJECTIVE:  PERTINENT PAST HISTORY:  DM TII, HTN, CAD       PRECAUTIONS: None  WEIGHT BEARING RESTRICTIONS No  FALLS:  Has patient fallen in last 6 months? Yes, Number of falls: 1x "my feet tangled up in something and I fell" no injury noted  MOI/History of condition:  Onset date: 5-6 months  SUBJECTIVE STATEMENT Pt reports that he had an increase in pain following last visit.  This lasted for a few days and has since resolved.  He cannot pinpoint an exact cause.   Red flags:  denies  Pain:  Are you having pain? 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  Assistive Device: na  Higher education careers adviser Dominance: L  Patient Goals/Specific Activities: reduce pain and improve function on arm   OBJECTIVE:    DIAGNOSTIC FINDINGS:  No MRI  GENERAL OBSERVATION:  NA     SENSATION:  Light touch: Appears intact   PALPATION: Minimal TTP infra and supra spinatus, TTP LH biceps tendon  UPPER EXTREMITY AROM:  ROM Right (Eval) Left (Eval)  Shoulder flexion 170 70*  Shoulder abduction 170 90*  Shoulder internal rotation    Shoulder external rotation    Functional IR n N*  Functional ER t5 Occiput*  Shoulder extension    Elbow extension    Elbow flexion     (Blank rows = not tested, N = WNL, * = concordant pain with testing)  UPPER EXTREMITY MMT:  MMT Right (Eval) Left (Eval)  Shoulder flexion 4+ 3*  Shoulder abduction (C5) 4+ 3*  Shoulder ER 4 3*  Shoulder IR n n  Middle trapezius    Lower trapezius    Shoulder extension    Grip strength 80 lbs 80 lbs  Cervical flexion (C1,C2)    Cervical S/B (C3)    Shoulder shrug (C4)    Elbow flexion (C6)  4+  Elbow ext (C7)    Thumb ext (C8)    Finger abd (T1)     Grossly     (Blank rows = not tested, score listed is out of 5 possible points.  N = WNL, D = diminished, C = clear for gross weakness with myotome testing, * = concordant pain with testing)   MUSCLE LENGTH:    NT  UPPER EXTREMITY PROM:  PROM Right (Eval) Left (Eval)  Shoulder flexion    Shoulder abduction    Shoulder internal rotation    Shoulder external rotation    Functional IR    Functional ER    Shoulder extension    Elbow extension    Elbow flexion     (Blank rows = not tested, N = WNL, * = concordant pain with testing)  SPECIAL TESTS:  R/C test cluster (+)  JOINT MOBILITY TESTING:  NT  PATIENT SURVEYS:  FOTO 41 -> 56    TODAY'S TREATMENT:  Creating, reviewing, and completing below HEP   PATIENT EDUCATION:  POC, diagnosis, prognosis, HEP, and outcome measures.  Pt educated via explanation, demonstration, and handout (HEP).  Pt confirms understanding verbally.   HOME EXERCISE PROGRAM: Access Code: [email or web address] URL: [email or web address] Date: 07/18/2023 Prepared by: Alphonzo Severance  Exercises - Standing Isometric Shoulder External Rotation with Doorway  - 2-3 x daily - 7 x weekly - 1 sets - 10 reps - 5 hold - Standing Isometric Shoulder Flexion with Doorway - Arm Bent  - 2-3 x daily - 7 x weekly - 1 sets - 10 reps - 5 second hold  Treatment priorities   Eval        Progressive R/C strengthening         TDN?                                 TREATMENT [facility] Adult PT Treatment:                                                DATE: 08/16/23 Therapeutic Exercise: Pulleys flexion x3' Rows RTB 2x10 Shoulder extension RTB 2x10 Seated scaption no weight x10 Supine shoulder flexion AAROM with dowel x10 Supine chest press with dowel 2x10 Sidelying trio: ER, horizontal abduction, flexion x20 ea    [facility] Adult PT Treatment:  DATE: 07/25/23 Therapeutic Exercise: Pulleys flexion x3' Rows RTB  2x10 Shoulder extension RTB 2x10 Seated double ER with scap retraction YTB (too painful) Seated scaption no weight x10 Supine shoulder flexion AAROM with dowel x10 Supine chest press with dowel x10 Sidelying trio: ER, abduction, flexion x15 ea Modalities: MHP to Lt shoulder, pt in sitting x 10 mins post session   ASSESSMENT:  CLINICAL IMPRESSION: Pt reports that he had increased pain following last visit so we decreased exercise intensity today.  This was overall tolerated well.  Pt to have MRI next week with follow up with MD shortly after.  Updated HEP today.  He will follow up PRN after talking to MD and getting MRI results.   OBJECTIVE IMPAIRMENTS: Pain, L shoulder ROM and strength  ACTIVITY LIMITATIONS: reaching, working, housework, Presenter, broadcasting, lifting   PERSONAL FACTORS: See medical history and pertinent history   REHAB POTENTIAL: Good  CLINICAL DECISION MAKING: Stable/uncomplicated  EVALUATION COMPLEXITY: Low   GOALS:   SHORT TERM GOALS: Target date: 08/15/2023   Esgardo will be >75% HEP compliant to improve carryover between sessions and facilitate independent management of condition  Evaluation: ongoing Goal status: MET   LONG TERM GOALS: Target date: 09/12/2023   Ervin will improve FOTO score to 56 as a proxy for functional improvement  Evaluation/Baseline: 41 Goal status: INITIAL    2.  Augusta will self report >/= 50% decrease in pain from evaluation   Evaluation/Baseline: 9/10 max pain Goal status: INITIAL   3.  Breken will improve the following MMTs to >/= 4/5 to show improvement in strength:  shoulder flexion and ER   Evaluation/Baseline: see chart in note Goal status: INITIAL   4.  Ginger will be able to return to work with minimal restrictions, not limited by pain  Evaluation/Baseline: limited Goal status: INITIAL   5.  Jaharri will demonstrate >130 degrees of active ROM in flexion to allow completion of activities involving reaching OH, not  limited by pain  Evaluation/Baseline: 70 degrees w/ pain Goal status: INITIAL    PLAN: PT FREQUENCY: 1-2x/week  PT DURATION: 8 weeks  PLANNED INTERVENTIONS: Therapeutic exercises, Aquatic therapy, Therapeutic activity, Neuro Muscular re-education, Gait training, Patient/Family education, Joint mobilization, Dry Needling, Electrical stimulation, Spinal mobilization and/or manipulation, Moist heat, Taping, Vasopneumatic device, Ionotophoresis 4mg /ml Dexamethasone, and Manual therapy  PLAN FOR NEXT SESSION: Continue R/C strengthening and shoulder ROM   Kimberlee Nearing Carter Kassel PT 08/16/2023, 11:37 AM

## 2023-08-20 ENCOUNTER — Ambulatory Visit
Admission: RE | Admit: 2023-08-20 | Discharge: 2023-08-20 | Disposition: A | Discharge status: Home / Self Care | Payer: Medicaid Other | Source: Ambulatory Visit | Attending: Sports Medicine | Admitting: Sports Medicine

## 2023-08-20 ENCOUNTER — Other Ambulatory Visit: Payer: Self-pay

## 2023-08-20 ENCOUNTER — Other Ambulatory Visit: Payer: Self-pay | Admitting: Internal Medicine

## 2023-08-20 DIAGNOSIS — M12812 Other specific arthropathies, not elsewhere classified, left shoulder: Secondary | ICD-10-CM

## 2023-08-20 DIAGNOSIS — M62838 Other muscle spasm: Secondary | ICD-10-CM

## 2023-08-20 DIAGNOSIS — M75112 Incomplete rotator cuff tear or rupture of left shoulder, not specified as traumatic: Secondary | ICD-10-CM | POA: Diagnosis not present

## 2023-08-20 DIAGNOSIS — G8929 Other chronic pain: Secondary | ICD-10-CM | POA: Diagnosis not present

## 2023-08-20 DIAGNOSIS — M19012 Primary osteoarthritis, left shoulder: Secondary | ICD-10-CM | POA: Diagnosis not present

## 2023-08-20 DIAGNOSIS — M25512 Pain in left shoulder: Secondary | ICD-10-CM | POA: Diagnosis not present

## 2023-08-20 MED ORDER — CYCLOBENZAPRINE HCL 10 MG PO TABS
10.0000 mg | ORAL_TABLET | Freq: Two times a day (BID) | ORAL | 0 refills | Status: DC | PRN
  Filled 2023-08-20: qty 60, 30d supply, fill #0

## 2023-08-21 ENCOUNTER — Other Ambulatory Visit: Payer: Self-pay

## 2023-08-21 MED ORDER — TRAMADOL HCL 50 MG PO TABS
100.0000 mg | ORAL_TABLET | Freq: Two times a day (BID) | ORAL | 2 refills | Status: DC | PRN
  Filled 2023-08-21: qty 120, 30d supply, fill #0
  Filled 2023-09-23: qty 120, 30d supply, fill #1
  Filled 2023-10-30: qty 120, 30d supply, fill #2

## 2023-08-22 ENCOUNTER — Other Ambulatory Visit: Payer: Self-pay

## 2023-08-25 ENCOUNTER — Telehealth: Payer: Self-pay | Admitting: Internal Medicine

## 2023-08-25 NOTE — Telephone Encounter (Signed)
Rescheduled 11/21 appointments time due to provider pal, patient has been called and notified of upcoming changes.

## 2023-08-27 ENCOUNTER — Other Ambulatory Visit: Payer: Self-pay

## 2023-08-27 ENCOUNTER — Ambulatory Visit: Payer: Medicaid Other | Admitting: Sports Medicine

## 2023-08-27 ENCOUNTER — Encounter: Payer: Self-pay | Admitting: Sports Medicine

## 2023-08-27 DIAGNOSIS — M7552 Bursitis of left shoulder: Secondary | ICD-10-CM | POA: Diagnosis not present

## 2023-08-27 DIAGNOSIS — G8929 Other chronic pain: Secondary | ICD-10-CM | POA: Diagnosis not present

## 2023-08-27 DIAGNOSIS — M19012 Primary osteoarthritis, left shoulder: Secondary | ICD-10-CM | POA: Diagnosis not present

## 2023-08-27 DIAGNOSIS — M25512 Pain in left shoulder: Secondary | ICD-10-CM

## 2023-08-27 DIAGNOSIS — M75102 Unspecified rotator cuff tear or rupture of left shoulder, not specified as traumatic: Secondary | ICD-10-CM

## 2023-08-27 MED ORDER — ZOSTER VAC RECOMB ADJUVANTED 50 MCG/0.5ML IM SUSR
0.5000 mL | Freq: Once | INTRAMUSCULAR | 0 refills | Status: AC
  Filled 2023-08-27: qty 0.5, 1d supply, fill #0

## 2023-08-27 NOTE — Progress Notes (Signed)
Jeremy [name] - 62 y.o. male MRN [id number]  Date of birth: 06/14/1961  Office Visit Note: Visit Date: 08/27/2023 PCP: Marcine Matar, MD Referred by: Marcine Matar, MD  Subjective: Chief Complaint  Patient presents with   Left Shoulder - Pain, Follow-up   HPI: Jeremy [name] is a pleasant 62 y.o. male who presents today for follow-up of acute on chronic left shoulder pain.  Mehtaab has had left shoulder pain for a number of months.  Back in July we performed an ultrasound-guided glenohumeral joint injection which essentially resolved his pain with 99% improvement following the injection.  Unfortunately a few weeks to months after this he reaggravated the left shoulder.  He has been involved in formalized physical therapy, he did try some dry needling and progression of his rehab which gave him quite a bit of soreness.  He presents today for MRI follow-up.  For pain control, he is taking tramadol 50 mg 3-4 times daily as well as Tylenol and Flexeril as needed.  Pertinent ROS were reviewed with the patient and found to be negative unless otherwise specified above in HPI.   Assessment & Plan: Visit Diagnoses:  1. Chronic left shoulder pain   2. Tear of left supraspinatus tendon   3. Primary osteoarthritis, left shoulder   4. Subacromial bursitis of left shoulder joint    Plan: Impression is acute on chronic left shoulder pain with exacerbation.  Discussed with Lyla Son he has multiple conditions that are contributing to his left shoulder pain, most notably he has a high-grade tear of the supraspinatus tendon without retraction.  He has concomitant arthritis within the shoulder joint as well as a reactive subacromial bursitis.  He did have essentially complete relief from his shoulder pain from prior glenohumeral joint injection.  We discussed continuing formalized physical therapy and transition to HEP for the next few weeks to months to see what sort of improvement he gets.  We  discussed repeating glenohumeral joint injection or consideration of subacromial joint injection, we will hold on this for now.  Given there is no tendon retraction, would start with conservative treatment as opposed to surgical management, although this could be last resort if not improving with above.  He will continue his tramadol 50 mg 3-4 times daily as neededFlexeril 10 mg once to twice daily as needed for pain control.  He will let me know after the next few weeks of therapy how he is doing and if he is interested in pursuing injection. Otherwise, f/u as needed.  Follow-up: Return for will call if wants US-guided shoulder injection .   Meds & Orders: No orders of the defined types were placed in this encounter.  No orders of the defined types were placed in this encounter.    Procedures: No procedures performed      Clinical History: No specialty comments available.  He reports that he quit smoking about 34 years ago. His smoking use included cigarettes. He has never used smokeless tobacco.  Recent Labs    09/14/22 1656 03/11/23 0058  HGBA1C 4.9 4.6*    Objective:    Physical Exam  Gen: Well-appearing, in no acute distress; non-toxic CV: Well-perfused. Warm.  Resp: Breathing unlabored on room air; no wheezing. Psych: Fluid speech in conversation; appropriate affect; normal thought process Neuro: Sensation intact throughout. No gross coordination deficits.   Ortho Exam - Left shoulder: Positive TTP over the anterior recess of the glenohumeral joint as well as at Codman's point.  There is near full active and passive range of motion although pain at endrange abduction.  There are no mechanical blocks to range of motion.  There is a positive empty can test as well as some pain with resisted external rotation, although no gross weakness compared to the contralateral side, more so secondary to pain.  Imaging:  MR Shoulder Left w/o contrast CLINICAL DATA:  Chronic left shoulder  pain.  No known injury.  EXAM: MRI OF THE LEFT SHOULDER WITHOUT CONTRAST  TECHNIQUE: Multiplanar, multisequence MR imaging of the shoulder was performed. No intravenous contrast was administered.  COMPARISON:  Plain films left shoulder 01/23/2023.  FINDINGS: Rotator cuff: The patient has rotator cuff tendinopathy. There is a 1 cm from front to back deep bursal sided tear of the anterior and far lateral supraspinatus. No tendon retraction is present.  Muscles:  All imaged muscles demonstrate moderate fatty atrophy.  Biceps long head: Intact. Tendinosis of the intra-articular segment is noted.  Acromioclavicular Joint: Moderate osteoarthritis. Type 2 acromion. There is a small to moderate volume of fluid in the subacromial/subdeltoid bursa.  Glenohumeral Joint: Mild degenerative change is seen.  Labrum:  Intact.  Bones:  No fracture or focal lesion.  Other: None.  IMPRESSION: Rotator cuff tendinopathy with a deep bursal sided tear of the anterior and far lateral supraspinatus measuring 1 cm from front to back. Negative for tendon retraction.  Moderate fatty atrophy of all imaged musculature is likely secondary to disuse.  Tendinopathy of the intra-articular long head of biceps without tear.  Moderate acromioclavicular osteoarthritis.  Fluid in the subacromial/subdeltoid bursa compatible with bursitis.  Electronically Signed   By: Drusilla Kanner M.D.   On: 08/24/2023 11:42  Past Medical/Family/Surgical/Social History: Medications & Allergies reviewed per EMR, new medications updated. 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   Coffee ground emesis 03/11/2023   NSAID long-term use 03/11/2023   Anemia, posthemorrhagic, acute 03/11/2023   Upper GI bleeding  03/10/2023   Hypertension associated with type 2 diabetes mellitus (HCC) 01/15/2023   Coronary artery disease of native artery of native heart with stable angina pectoris (HCC) 01/15/2023   Constipation 10/25/2022   Upper abdominal pain 07/06/2022   Nausea and vomiting 07/06/2022   Hyponatremia 06/23/2021   Low serum vitamin B12 03/23/2021   Vitamin D deficiency 03/23/2021   S/P bypass gastrojejunostomy 01/03/2021   Gastric outlet obstruction    Duodenal stricture    Primary osteoarthritis of both hips 11/15/2020   Aortic atherosclerosis (HCC) 08/30/2020   Coronary artery calcification 08/30/2020   Bilateral carpal tunnel syndrome 06/07/2020   Grief reaction 06/07/2020   Pyloric stenosis in adult 02/05/2020   Congenital hypertrophic pyloric stenosis    Duodenal web    Barrett's esophagus without dysplasia 05/19/2019   OSA (obstructive sleep apnea) 04/30/2019   Iron deficiency anemia 11/25/2018   Phimosis of penis 11/19/2018   Esophageal dysphagia 08/19/2018   Benign prostatic hyperplasia with post-void dribbling 01/31/2018   Absence of bladder continence 01/31/2018   Obesity (BMI 30-39.9) 11/05/2017   GERD (gastroesophageal reflux disease) 06/03/2017   Chronic pain of both knees 06/03/2017   Chronic lower back pain 06/03/2017   Type 2 diabetes mellitus without complication, without long-term current use of insulin (HCC) 04/12/2016   Essential hypertension 04/12/2016   Past Medical History:  Diagnosis Date  Diabetes mellitus without complication (HCC)    GERD (gastroesophageal reflux disease)    Heart murmur    Hyperlipidemia    Hypertension    IDA (iron deficiency anemia)    Rash of entire body 03/2016   Sleep apnea    Family History  Problem Relation Age of Onset   Breast cancer Mother    Pancreatic cancer Mother    CAD Father    Colon cancer Paternal Grandfather    Past Surgical History:  Procedure Laterality Date   BACK SURGERY  1993   lumbar   BALLOON  DILATION N/A 04/25/2023   Procedure: BALLOON DILATION;  Surgeon: Lanelle Bal, DO;  [facility];  Service: Endoscopy;  Laterality: N/A;   BIOPSY  02/07/2019   Procedure: BIOPSY;  Surgeon: West Bali, MD;  [facility];  Service: Endoscopy;;   BIOPSY  01/30/2022   Procedure: BIOPSY;  Surgeon: Lanelle Bal, DO;  [facility];  Service: Endoscopy;;   BIOPSY  04/25/2023   Procedure: BIOPSY;  Surgeon: Lanelle Bal, DO;  [facility];  Service: Endoscopy;;   CERVICAL SPINE SURGERY  2000   COLONOSCOPY N/A 02/07/2019   Dr. Darrick Penna: External hemorrhoids next colonoscopy in 10 years   ESOPHAGOGASTRODUODENOSCOPY N/A 02/07/2019   Dr. Darrick Penna: Barrett's esophagus without dysplasia chronic inactive gastritis but no H. 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, nonbleeding duodenal diverticulum,   ESOPHAGOGASTRODUODENOSCOPY N/A 01/05/2020   Procedure: ESOPHAGOGASTRODUODENOSCOPY (EGD);  Surgeon: West Bali, MD;  [facility];  Service: Endoscopy;  Laterality: N/A;  10:30am   ESOPHAGOGASTRODUODENOSCOPY (EGD) WITH PROPOFOL N/A 12/06/2020   Procedure: ESOPHAGOGASTRODUODENOSCOPY (EGD) WITH PROPOFOL;  Surgeon: Lanelle Bal, DO;  [facility];  Service: Endoscopy;  Laterality: N/A;  11:45am   ESOPHAGOGASTRODUODENOSCOPY (EGD) WITH PROPOFOL N/A 12/22/2021   long-segment Barrett's s/p biopsy, gastric bypass with normal sized puch and intact staple line. GJ anastomosis with healthy mucosa. Gastritis.   ESOPHAGOGASTRODUODENOSCOPY (EGD) WITH PROPOFOL N/A 01/30/2022   Procedure: ESOPHAGOGASTRODUODENOSCOPY (EGD) WITH PROPOFOL;  Surgeon: Lanelle Bal, DO;  [facility];  Service: Endoscopy;  Laterality: N/A;  11:00am   ESOPHAGOGASTRODUODENOSCOPY (EGD) WITH PROPOFOL N/A 03/11/2023   Procedure: ESOPHAGOGASTRODUODENOSCOPY (EGD) WITH PROPOFOL;  Surgeon: Benancio Deeds, MD;  Location: [facility];  Service: Gastroenterology;  Laterality: N/A;   ESOPHAGOGASTRODUODENOSCOPY (EGD) WITH PROPOFOL N/A 04/25/2023   Procedure: ESOPHAGOGASTRODUODENOSCOPY (EGD) WITH PROPOFOL;  Surgeon: Lanelle Bal, DO;  [facility];  Service: Endoscopy;  Laterality: N/A;  10:30 am, asa 3   GASTROJEJUNOSTOMY N/A 01/03/2021   Procedure: GASTROJEJUNOSTOMY;  Surgeon: [name], MD;  Location: [facility];  Service: General;  Laterality: N/A;   HAND SURGERY     SAVORY DILATION N/A 01/05/2020   Procedure: SAVORY DILATION;  Surgeon: West Bali, MD;  [facility];  Service: Endoscopy;  Laterality: N/A;   Social History   Occupational History   Occupation: Product/process development scientist  Tobacco Use   Smoking status: Former    Current packs/day: 0.00    Types: Cigarettes    Quit date: 10/16/1988    Years since quitting: 34.8   Smokeless tobacco: Never  Vaping Use   Vaping status: Never Used  Substance and Sexual Activity   Alcohol use: No   Drug use: Yes    Comment: rare   Sexual activity: Not on file

## 2023-08-27 NOTE — Progress Notes (Signed)
Patient says that his shoulder is feeling about the same. He says that two weeks ago they tried dry needling in physical therapy which left him in pain for about 3 days.

## 2023-09-01 NOTE — Progress Notes (Unsigned)
Referring Provider: Marcine Matar, MD Primary Care Physician:  Marcine Matar, MD Primary GI Physician: Dr. Marletta Lor  No chief complaint on file.   HPI:   Jeremy [name] is a 62 y.o. 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 Lovell Sheehan on 01/03/2021, history of long segment Barrett's esophagus, constipation, IDA following with hematology for IV iron prn.   In May of this year, he developed coffee-ground emesis and was found to have large ulceration and acquired stenosis causing partial gastric outlet obstruction.  Follow-up EGD 04/25/2023 with long segment Barrett's, gastrojejunostomy characterized by healthy-appearing mucosa, acquired duodenal stenosis dilated, gastritis biopsied.  Gastric biopsy with reactive gastropathy, chronic gastritis, negative for H. pylori.  Last seen in the office 06/04/2023.  Denied nausea, vomiting, abdominal pain.  Reported he was eating small portions and did not get hungry, but then gave an example of this stating he did not eat until 9 PM, but ate a little more than one half of a frozen pizza followed by to baked potatoes.  He had no weight loss, GERD, or dysphagia.  Reported taking a few Aleve since hurting his back, total of 6 in 2 months.  Continued with constipation.  Recommended continuing pantoprazole twice daily, avoid all NSAIDs, small frequent meals, Benefiber daily, MiraLAX daily if needed, follow-up in 3 months.   Today:   Past Medical History:  Diagnosis Date   Diabetes mellitus without complication (HCC)    GERD (gastroesophageal reflux disease)    Heart murmur    Hyperlipidemia    Hypertension    IDA (iron deficiency anemia)    Rash of entire body 03/2016   Sleep apnea     Past Surgical History:  Procedure Laterality Date   BACK SURGERY  1993    lumbar   BALLOON DILATION N/A 04/25/2023   Procedure: BALLOON DILATION;  Surgeon: Lanelle Bal, DO;  [facility];  Service: Endoscopy;  Laterality: N/A;   BIOPSY  02/07/2019   Procedure: BIOPSY;  Surgeon: West Bali, MD;  [facility];  Service: Endoscopy;;   BIOPSY  01/30/2022   Procedure: BIOPSY;  Surgeon: Lanelle Bal, DO;  [facility];  Service: Endoscopy;;   BIOPSY  04/25/2023   Procedure: BIOPSY;  Surgeon: Lanelle Bal, DO;  [facility];  Service: Endoscopy;;   CERVICAL SPINE SURGERY  2000   COLONOSCOPY N/A 02/07/2019   Dr. Darrick Penna: External hemorrhoids next colonoscopy in 10 years   ESOPHAGOGASTRODUODENOSCOPY N/A 02/07/2019   Dr. Darrick Penna: Barrett's esophagus without dysplasia chronic inactive gastritis but no H. 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, nonbleeding duodenal diverticulum,   ESOPHAGOGASTRODUODENOSCOPY N/A 01/05/2020   Procedure: ESOPHAGOGASTRODUODENOSCOPY (EGD);  Surgeon: West Bali, MD;  [facility];  Service: Endoscopy;  Laterality: N/A;  10:30am   ESOPHAGOGASTRODUODENOSCOPY (EGD) WITH PROPOFOL N/A 12/06/2020   Procedure: ESOPHAGOGASTRODUODENOSCOPY (EGD) WITH PROPOFOL;  Surgeon: Lanelle Bal, DO;  [facility];  Service: Endoscopy;  Laterality: N/A;  11:45am   ESOPHAGOGASTRODUODENOSCOPY (EGD) WITH PROPOFOL N/A 12/22/2021   long-segment Barrett's s/p biopsy, gastric bypass with normal sized puch and intact staple line. GJ anastomosis with healthy mucosa. Gastritis.   ESOPHAGOGASTRODUODENOSCOPY (EGD) WITH PROPOFOL N/A 01/30/2022   Procedure: ESOPHAGOGASTRODUODENOSCOPY (EGD) WITH PROPOFOL;  Surgeon:  Lanelle Bal, DO;  [facility];  Service: Endoscopy;  Laterality: N/A;  11:00am   ESOPHAGOGASTRODUODENOSCOPY (EGD) WITH PROPOFOL N/A 03/11/2023   Procedure: ESOPHAGOGASTRODUODENOSCOPY (EGD) WITH PROPOFOL;   Surgeon: Benancio Deeds, MD;  Location: [facility];  Service: Gastroenterology;  Laterality: N/A;   ESOPHAGOGASTRODUODENOSCOPY (EGD) WITH PROPOFOL N/A 04/25/2023   Procedure: ESOPHAGOGASTRODUODENOSCOPY (EGD) WITH PROPOFOL;  Surgeon: Lanelle Bal, DO;  [facility];  Service: Endoscopy;  Laterality: N/A;  10:30 am, asa 3   GASTROJEJUNOSTOMY N/A 01/03/2021   Procedure: GASTROJEJUNOSTOMY;  Surgeon: [name], MD;  [facility]: [facility];  Service: General;  Laterality: N/A;   HAND SURGERY     SAVORY DILATION N/A 01/05/2020   Procedure: SAVORY DILATION;  Surgeon: West Bali, MD;  [facility];  Service: Endoscopy;  Laterality: N/A;    Current Outpatient Medications  Medication Sig Dispense Refill   Accu-Chek Softclix Lancets lancets Use to check blood sugar three times daily. 100 each 2   acetaminophen (TYLENOL) 500 MG tablet Take 1,000 mg by mouth in the morning and at bedtime.      amLODipine (NORVASC) 5 MG tablet Take 1 tablet (5 mg total) by mouth daily. 90 tablet 1   ascorbic acid (VITAMIN C) 500 MG tablet Take 500 mg by mouth 2 (two) times daily.     Blood Glucose Monitoring Suppl (ACCU-CHEK GUIDE) w/Device KIT Use to check blood sugar three times daily. 1 kit 0   Cyanocobalamin (VITAMIN B-12 PO) Take 2,500 mcg by mouth daily.     cyclobenzaprine (FLEXERIL) 10 MG tablet Take 1 tablet (10 mg total) by mouth 2 (two) times daily as needed for muscle spasms. 60 tablet 0   ferrous sulfate (FEROSUL) 325 (65 FE) MG tablet Take 1 tablet (325 mg total) by mouth 2 (two) times daily with a meal. 180 tablet 0   fluticasone (FLONASE) 50 MCG/ACT nasal spray Place 1 spray into both nostrils daily as needed for allergies or rhinitis. 16 g 2   glipiZIDE (GLUCOTROL) 5 MG tablet Take 1 tablet (5 mg total) in the morning AND 1 tablet (5 mg total) every evening. 180 tablet 0   lisinopril (ZESTRIL) 20 MG tablet Take 1 tablet (20 mg total) by mouth in the morning and at bedtime.  180 tablet 0   loratadine (CLARITIN) 10 MG tablet Take 10 mg by mouth daily.     Magnesium Oxide 200 MG TABS Take 200 mg by mouth in the morning and at bedtime.     Multiple Vitamins-Minerals (MULTIVITAMIN WITH MINERALS) tablet Take 1 tablet by mouth 2 (two) times daily.     ondansetron (ZOFRAN) 8 MG tablet Take 1 tablet (8 mg total) by mouth daily as needed for nausea or vomiting. 30 tablet 2   pantoprazole (PROTONIX) 40 MG tablet Take 1 tablet (40 mg total) by mouth 2 (two) times daily before a meal. 60 tablet 5   Potassium 99 MG TABS Take 198 mg by mouth 2 (two) times daily.     rOPINIRole (REQUIP) 1 MG tablet Take 1 tablet (1 mg total) by mouth at bedtime. 90 tablet 1   sildenafil (VIAGRA) 50 MG tablet Take 1 tablet (50 mg total) by mouth 1/2 hr before intercourse.  Limit use to 1 tab/24 hr 10 tablet 3   sucralfate (CARAFATE) 1 GM/10ML suspension Take 10 mLs (1 g total) by mouth every 8 (eight) hours. 420 mL 1   traMADol (ULTRAM) 50 MG tablet Take 2 tablets (  100 mg total) by mouth every 12 (twelve) hours as needed (to fill on or after 05/20/2023). 120 tablet 2   No current facility-administered medications for this visit.    Allergies as of 09/03/2023 - Review Complete 08/27/2023  Allergen Reaction Noted   Cymbalta [duloxetine hcl] Other (See Comments) 06/05/2017   Doxycycline  03/02/2021   Other Nausea And Vomiting 01/24/2019   Robaxin [methocarbamol] Other (See Comments) 06/02/2019    Family History  Problem Relation Age of Onset   Breast cancer Mother    Pancreatic cancer Mother    CAD Father    Colon cancer Paternal Grandfather     Social History   Socioeconomic History   Marital status: Significant Other    Spouse name: Not on file   Number of children: Not on file   Years of education: Not on file   Highest education level: Not on file  Occupational History   Occupation: Product/process development scientist  Tobacco Use   Smoking status: Former    Current packs/day: 0.00    Types:  Cigarettes    Quit date: 10/16/1988    Years since quitting: 34.8   Smokeless tobacco: Never  Vaping Use   Vaping status: Never Used  Substance and Sexual Activity   Alcohol use: No   Drug use: Yes    Comment: rare   Sexual activity: Not on file  Other Topics Concern   Not on file  Social History Narrative   Volunteers as IT sales professional   Social Determinants of Health   Financial Resource Strain: Not on file  Food Insecurity: No Food Insecurity (03/10/2023)   Hunger Vital Sign    Worried About Running Out of Food in the Last Year: Never true    Ran Out of Food in the Last Year: Never true  Transportation Needs: No Transportation Needs (03/10/2023)   PRAPARE - Administrator, Civil Service (Medical): No    Lack of Transportation (Non-Medical): No  Physical Activity: Not on file  Stress: Not on file  Social Connections: Not on file    Review of Systems: Gen: Denies fever, chills, anorexia. Denies fatigue, weakness, weight loss.  CV: Denies chest pain, palpitations, syncope, peripheral edema, and claudication. Resp: Denies dyspnea at rest, cough, wheezing, coughing up blood, and pleurisy. GI: Denies vomiting blood, jaundice, and fecal incontinence.   Denies dysphagia or odynophagia. Derm: Denies rash, itching, dry skin Psych: Denies depression, anxiety, memory loss, confusion. No homicidal or suicidal ideation.  Heme: Denies bruising, bleeding, and enlarged lymph nodes.  Physical Exam: There were no vitals taken for this visit. General:   Alert and oriented. No distress noted. Pleasant and cooperative.  Head:  Normocephalic and atraumatic. Eyes:  Conjuctiva clear without scleral icterus. Heart:  S1, S2 present without murmurs appreciated. Lungs:  Clear to auscultation bilaterally. No wheezes, rales, or rhonchi. No distress.  Abdomen:  +BS, soft, non-tender and non-distended. No rebound or guarding. No HSM or masses noted. Msk:  Symmetrical without gross deformities.  Normal posture. Extremities:  Without edema. Neurologic:  Alert and  oriented x4 Psych:  Normal mood and affect.    Assessment:     Plan:  ***   Ermalinda Memos, PA-C [facility] 09/03/2023

## 2023-09-03 ENCOUNTER — Ambulatory Visit: Payer: Medicaid Other | Admitting: Gastroenterology

## 2023-09-03 ENCOUNTER — Encounter: Payer: Self-pay | Admitting: Gastroenterology

## 2023-09-03 VITALS — BP 123/60 | HR 72 | Temp 97.9°F | Ht 70.0 in | Wt 202.0 lb

## 2023-09-03 DIAGNOSIS — Z8711 Personal history of peptic ulcer disease: Secondary | ICD-10-CM | POA: Diagnosis not present

## 2023-09-03 DIAGNOSIS — K2100 Gastro-esophageal reflux disease with esophagitis, without bleeding: Secondary | ICD-10-CM | POA: Diagnosis not present

## 2023-09-03 DIAGNOSIS — K5900 Constipation, unspecified: Secondary | ICD-10-CM

## 2023-09-03 DIAGNOSIS — R194 Change in bowel habit: Secondary | ICD-10-CM | POA: Diagnosis not present

## 2023-09-03 NOTE — Patient Instructions (Addendum)
Continue pantoprazole 40 mg twice daily 30 minutes before breakfast and dinner.   Start Metamucil daily. You can follow the instructions on the container.   You can also start a daily probiotic such as digestive advantage, Vear Clock' colon health, align.  I recommend that we avoid all NSAID products including aspirin, Aleve, Advil, ibuprofen, naproxen, meloxicam, diclofenac, BC powders, Goody powders, and anything that says "NSAID" on the package due to history of peptic ulcer disease and duodenal stenosis.  Please discuss pain management options with your primary care doctor including possible referral to pain management if needed.  I will plan to see you back in 6 months or sooner if needed.  I hope you have a fantastic Thanksgiving and Christmas!  Ermalinda Memos, PA-C [facility]

## 2023-09-06 ENCOUNTER — Other Ambulatory Visit: Payer: Medicaid Other

## 2023-09-06 ENCOUNTER — Ambulatory Visit: Payer: Medicaid Other | Admitting: Internal Medicine

## 2023-09-06 ENCOUNTER — Inpatient Hospital Stay: Payer: Medicaid Other | Attending: Internal Medicine

## 2023-09-06 ENCOUNTER — Inpatient Hospital Stay: Payer: Medicaid Other | Admitting: Internal Medicine

## 2023-09-06 VITALS — BP 122/49 | HR 62 | Temp 100.0°F | Resp 17 | Ht 70.0 in | Wt 203.3 lb

## 2023-09-06 DIAGNOSIS — D508 Other iron deficiency anemias: Secondary | ICD-10-CM | POA: Diagnosis not present

## 2023-09-06 DIAGNOSIS — I10 Essential (primary) hypertension: Secondary | ICD-10-CM | POA: Insufficient documentation

## 2023-09-06 DIAGNOSIS — D62 Acute posthemorrhagic anemia: Secondary | ICD-10-CM

## 2023-09-06 DIAGNOSIS — K909 Intestinal malabsorption, unspecified: Secondary | ICD-10-CM | POA: Diagnosis not present

## 2023-09-06 LAB — CBC WITH DIFFERENTIAL (CANCER CENTER ONLY)
Abs Immature Granulocytes: 0.09 10*3/uL — ABNORMAL HIGH (ref 0.00–0.07)
Basophils Absolute: 0.1 10*3/uL (ref 0.0–0.1)
Basophils Relative: 2 %
Eosinophils Absolute: 0.3 10*3/uL (ref 0.0–0.5)
Eosinophils Relative: 7 %
HCT: 36.5 % — ABNORMAL LOW (ref 39.0–52.0)
Hemoglobin: 12.5 g/dL — ABNORMAL LOW (ref 13.0–17.0)
Immature Granulocytes: 2 %
Lymphocytes Relative: 24 %
Lymphs Abs: 1.1 10*3/uL (ref 0.7–4.0)
MCH: 33.2 pg (ref 26.0–34.0)
MCHC: 34.2 g/dL (ref 30.0–36.0)
MCV: 96.8 fL (ref 80.0–100.0)
Monocytes Absolute: 0.5 10*3/uL (ref 0.1–1.0)
Monocytes Relative: 10 %
Neutro Abs: 2.6 10*3/uL (ref 1.7–7.7)
Neutrophils Relative %: 55 %
Platelet Count: 223 10*3/uL (ref 150–400)
RBC: 3.77 MIL/uL — ABNORMAL LOW (ref 4.22–5.81)
RDW: 12.4 % (ref 11.5–15.5)
WBC Count: 4.7 10*3/uL (ref 4.0–10.5)
nRBC: 0 % (ref 0.0–0.2)

## 2023-09-06 LAB — IRON AND IRON BINDING CAPACITY (CC-WL,HP ONLY)
Iron: 52 ug/dL (ref 45–182)
Saturation Ratios: 15 % — ABNORMAL LOW (ref 17.9–39.5)
TIBC: 357 ug/dL (ref 250–450)
UIBC: 305 ug/dL (ref 117–376)

## 2023-09-06 LAB — FERRITIN: Ferritin: 35 ng/mL (ref 24–336)

## 2023-09-06 NOTE — Progress Notes (Signed)
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  PRIOR THERAPY: Iron infusion with Venofer 300 mg IV weekly for 3 weeks.  CURRENT THERAPY: Over-the-counter oral iron tablet with vitamin C twice daily.  INTERVAL HISTORY: Jeremy [name] 62 y.o. male return to the clinic today for follow-up visit.Discussed the use of AI scribe software for clinical note transcription with the patient, who gave verbal consent to proceed.  History of Present Illness   The 62 year old patient with a history of iron deficiency anemia secondary to malabsorption from a gastrogynostomy performed in March 2022 presents for follow-up. He has been receiving periodic iron infusions, the most recent of which was three months ago. He reports feeling "not bad," with no significant fatigue, dizziness, hematochezia, or hematuria. He has experienced vomiting twice in the past three months, which he attributes to dietary factors.  The patient is on a regimen of supplements, including two iron pills daily, magnesium, B12, Vitamin C, and a multivitamin, with no reported issues. He also reports the recent addition of Norvasc 5mg  to his blood pressure management regimen.  He notes occasional leg swelling, more prominent in the mornings and visible sock marks in the evening. The patient's ferritin levels were reported to be low on recent lab work.        MEDICAL HISTORY: Past Medical History:  Diagnosis Date   Diabetes mellitus without complication (HCC)    GERD (gastroesophageal reflux disease)    Heart murmur    Hyperlipidemia    Hypertension    IDA (iron deficiency anemia)    Rash of entire body 03/2016   Sleep apnea     ALLERGIES:  is allergic to cymbalta [duloxetine hcl],  doxycycline, other, and robaxin [methocarbamol].  MEDICATIONS:  Current Outpatient Medications  Medication Sig Dispense Refill   Accu-Chek Softclix Lancets lancets Use to check blood sugar three times daily. 100 each 2   acetaminophen (TYLENOL) 500 MG tablet Take 1,000 mg by mouth in the morning and at bedtime.      amLODipine (NORVASC) 5 MG tablet Take 1 tablet (5 mg total) by mouth daily. 90 tablet 1   ascorbic acid (VITAMIN C) 500 MG tablet Take 500 mg by mouth 2 (two) times daily.     Blood Glucose Monitoring Suppl (ACCU-CHEK GUIDE) w/Device KIT Use to check blood sugar three times daily. 1 kit 0   Cyanocobalamin (VITAMIN B-12 PO) Take 2,500 mcg by mouth daily.     cyclobenzaprine (FLEXERIL) 10 MG tablet Take 1 tablet (10 mg total) by mouth 2 (two) times daily as needed for muscle spasms. 60 tablet 0   ferrous sulfate (FEROSUL) 325 (65 FE) MG tablet Take 1 tablet (325 mg total) by mouth 2 (two) times daily with a meal. 180 tablet 0   fluticasone (FLONASE) 50 MCG/ACT nasal spray Place 1 spray into both nostrils daily as needed for allergies or rhinitis. (Patient not taking: Reported on 09/03/2023) 16 g 2   glipiZIDE (GLUCOTROL) 5 MG tablet Take 1 tablet (5 mg total) in the morning AND 1 tablet (5 mg total) every evening. 180 tablet 0   lisinopril (ZESTRIL) 20 MG tablet Take 1 tablet (20 mg total) by mouth in the morning and at bedtime. 180 tablet 0   loratadine (CLARITIN) 10 MG tablet  Take 10 mg by mouth daily.     Magnesium Oxide 200 MG TABS Take 200 mg by mouth in the morning and at bedtime.     Multiple Vitamins-Minerals (MULTIVITAMIN WITH MINERALS) tablet Take 1 tablet by mouth 2 (two) times daily.     ondansetron (ZOFRAN) 8 MG tablet Take 1 tablet (8 mg total) by mouth daily as needed for nausea or vomiting. 30 tablet 2   pantoprazole (PROTONIX) 40 MG tablet Take 1 tablet (40 mg total) by mouth 2 (two) times daily before a meal. 60 tablet 5   Potassium 99 MG TABS Take 198 mg by mouth 2  (two) times daily.     rOPINIRole (REQUIP) 1 MG tablet Take 1 tablet (1 mg total) by mouth at bedtime. 90 tablet 1   sildenafil (VIAGRA) 50 MG tablet Take 1 tablet (50 mg total) by mouth 1/2 hr before intercourse.  Limit use to 1 tab/24 hr 10 tablet 3   traMADol (ULTRAM) 50 MG tablet Take 2 tablets (100 mg total) by mouth every 12 (twelve) hours as needed (to fill on or after 05/20/2023). 120 tablet 2   No current facility-administered medications for this visit.    SURGICAL HISTORY:  Past Surgical History:  Procedure Laterality Date   BACK SURGERY  1993   lumbar   BALLOON DILATION N/A 04/25/2023   Procedure: BALLOON DILATION;  Surgeon: Lanelle Bal, DO;  [facility];  Service: Endoscopy;  Laterality: N/A;   BIOPSY  02/07/2019   Procedure: BIOPSY;  Surgeon: West Bali, MD;  [facility];  Service: Endoscopy;;   BIOPSY  01/30/2022   Procedure: BIOPSY;  Surgeon: Lanelle Bal, DO;  [facility];  Service: Endoscopy;;   BIOPSY  04/25/2023   Procedure: BIOPSY;  Surgeon: Lanelle Bal, DO;  [facility];  Service: Endoscopy;;   CERVICAL SPINE SURGERY  2000   COLONOSCOPY N/A 02/07/2019   Dr. Darrick Penna: External hemorrhoids next colonoscopy in 10 years   ESOPHAGOGASTRODUODENOSCOPY N/A 02/07/2019   Dr. Darrick Penna: Barrett's esophagus without dysplasia chronic inactive gastritis but no H. 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, nonbleeding duodenal diverticulum,   ESOPHAGOGASTRODUODENOSCOPY N/A 01/05/2020   Procedure: ESOPHAGOGASTRODUODENOSCOPY (EGD);  Surgeon: West Bali, MD;  [facility];  Service: Endoscopy;  Laterality: N/A;  10:30am   ESOPHAGOGASTRODUODENOSCOPY (EGD) WITH PROPOFOL N/A 12/06/2020   Procedure: ESOPHAGOGASTRODUODENOSCOPY (EGD) WITH PROPOFOL;  Surgeon: Lanelle Bal, DO;  [facility];  Service: Endoscopy;  Laterality: N/A;  11:45am    ESOPHAGOGASTRODUODENOSCOPY (EGD) WITH PROPOFOL N/A 12/22/2021   long-segment Barrett's s/p biopsy, gastric bypass with normal sized puch and intact staple line. GJ anastomosis with healthy mucosa. Gastritis.   ESOPHAGOGASTRODUODENOSCOPY (EGD) WITH PROPOFOL N/A 01/30/2022   Procedure: ESOPHAGOGASTRODUODENOSCOPY (EGD) WITH PROPOFOL;  Surgeon: Lanelle Bal, DO;  [facility];  Service: Endoscopy;  Laterality: N/A;  11:00am   ESOPHAGOGASTRODUODENOSCOPY (EGD) WITH PROPOFOL N/A 03/11/2023   Procedure: ESOPHAGOGASTRODUODENOSCOPY (EGD) WITH PROPOFOL;  Surgeon: Benancio Deeds, MD;  Location: [facility] END[facility]COPY;  Service: Gastroenterology;  Laterality: N/A;   ESOPHAGOGASTRODUODENOSCOPY (EGD) WITH PROPOFOL N/A 04/25/2023   Procedure: ESOPHAGOGASTRODUODENOSCOPY (EGD) WITH PROPOFOL;  Surgeon: Lanelle Bal, DO;  [facility];  Service: Endoscopy;  Laterality: N/A;  10:30 am, asa 3   GASTROJEJUNOSTOMY N/A 01/03/2021   Procedure: GASTROJEJUNOSTOMY;  Surgeon: [name], MD;  [facility];  Service: General;  Laterality: N/A;   HAND SURGERY     SAVORY DILATION N/A 01/05/2020   Procedure: SAVORY DILATION;  Surgeon: West Bali, MD;  [facility];  Service: Endoscopy;  Laterality: N/A;    REVIEW OF SYSTEMS:  A comprehensive review of systems was negative.   PHYSICAL EXAMINATION: General appearance: alert, cooperative, and no distress Head: Normocephalic, without obvious abnormality, atraumatic Neck: no adenopathy, no JVD, supple, symmetrical, trachea midline, and thyroid not enlarged, symmetric, no tenderness/mass/nodules Lymph nodes: Cervical, supraclavicular, and axillary nodes normal. Resp: clear to auscultation bilaterally Back: symmetric, no curvature. ROM normal. No CVA tenderness. Cardio: regular rate and rhythm, S1, S2 normal, no murmur, click, rub or gallop GI: soft, non-tender; bowel sounds normal; no masses,  no organomegaly Extremities:  extremities normal, atraumatic, no cyanosis or edema  ECOG PERFORMANCE STATUS: 1 - Symptomatic but completely ambulatory  Blood pressure (!) 122/49, pulse 62, temperature 100 F (37.8 C), temperature source Temporal, resp. rate 17, height 5\' 10"  (1.778 m), weight 203 lb 4.8 oz (92.2 kg), SpO2 99%.  LABORATORY DATA: Lab Results  Component Value Date   WBC 5.5 06/14/2023   HGB 12.2 (L) 06/14/2023   HCT 37.3 (L) 06/14/2023   MCV 94.7 06/14/2023   PLT 180 06/14/2023      Chemistry      Component Value Date/Time   NA 134 (L) 04/23/2023 1039   NA 138 05/17/2021 1657   K 3.7 04/23/2023 1039   CL 101 04/23/2023 1039   CO2 24 04/23/2023 1039   BUN 13 04/23/2023 1039   BUN 11 05/17/2021 1657   CREATININE 0.80 04/23/2023 1039   CREATININE 0.65 12/14/2022 0832   CREATININE 0.72 10/17/2016 1012      Component Value Date/Time   CALCIUM 9.2 04/23/2023 1039   ALKPHOS 51 03/11/2023 0543   AST 18 03/11/2023 0543   AST 24 12/14/2022 0832   ALT 23 03/11/2023 0543   ALT 21 12/14/2022 0832   BILITOT 0.7 03/11/2023 0543   BILITOT 0.2 (L) 12/14/2022 0832       RADIOGRAPHIC STUDIES: MR Shoulder Left w/o contrast  Result Date: 08/24/2023 CLINICAL DATA:  Chronic left shoulder pain.  No known injury. EXAM: MRI OF THE LEFT SHOULDER WITHOUT CONTRAST TECHNIQUE: Multiplanar, multisequence MR imaging of the shoulder was performed. No intravenous contrast was administered. COMPARISON:  Plain films left shoulder 01/23/2023. FINDINGS: Rotator cuff: The patient has rotator cuff tendinopathy. There is a 1 cm from front to back deep bursal sided tear of the anterior and far lateral supraspinatus. No tendon retraction is present. Muscles:  All imaged muscles demonstrate moderate fatty atrophy. Biceps long head: Intact. Tendinosis of the intra-articular segment is noted. Acromioclavicular Joint: Moderate osteoarthritis. Type 2 acromion. There is a small to moderate volume of fluid in the subacromial/subdeltoid  bursa. Glenohumeral Joint: Mild degenerative change is seen. Labrum:  Intact. Bones:  No fracture or focal lesion. Other: None. IMPRESSION: Rotator cuff tendinopathy with a deep bursal sided tear of the anterior and far lateral supraspinatus measuring 1 cm from front to back. Negative for tendon retraction. Moderate fatty atrophy of all imaged musculature is likely secondary to disuse. Tendinopathy of the intra-articular long head of biceps without tear. Moderate acromioclavicular osteoarthritis. Fluid in the subacromial/subdeltoid bursa compatible with bursitis. Electronically Signed   By: Drusilla Kanner M.D.   On: 08/24/2023 11:42    ASSESSMENT AND PLAN: This is a very pleasant 62 years old white male with iron deficiency anemia secondary to malabsorption secondary  to gastrojejunostomy in March 2022. The patient was treated with iron infusion with Venofer 300 mg IV for 3 doses.  Last dose was given in March 2024.    Iron Deficiency Anemia Chronic iron deficiency anemia secondary to malabsorption post-gastrojejunostomy (March 2022). Currently on two iron pills daily and other supplements (magnesium, B12, Vitamin C, multivitamin). Last iron infusion was three months ago. Symptoms include feeling cold; no fatigue, dizziness, blood in stool/urine, nausea, or diarrhea. Hemoglobin levels are well-managed, but ferritin levels are low, indicating low iron stores 3 months ago. - Continue current iron supplementation (two iron pills daily) - Arrange iron infusion if iron levels are low - Follow-up in three months with lab work  Hypertension Well-managed on current antihypertensive regimen, including Norvasc 5 mg. No specific issues or side effects reported. - Continue current antihypertensive regimen including Norvasc 5 mg  General Health Maintenance No issues reported with current supplement regimen (magnesium, B12, Vitamin C, multivitamin). - Continue current supplement regimen  Follow-up - Schedule  follow-up visit with Dr. Jerolyn Center in three months - Perform lab work prior to next visit.   The patient was advised to call immediately if he has any other concerning symptoms in the interval. All questions were answered. The patient knows to call the clinic with any problems, questions or concerns. We can certainly see the patient much sooner if necessary. The total time spent in the appointment was 20 minutes.  Disclaimer: This note was dictated with voice recognition software. Similar sounding words can inadvertently be transcribed and may not be corrected upon review.

## 2023-09-07 ENCOUNTER — Other Ambulatory Visit: Payer: Self-pay

## 2023-09-07 ENCOUNTER — Encounter: Payer: Self-pay | Admitting: Internal Medicine

## 2023-09-07 ENCOUNTER — Ambulatory Visit: Payer: Medicaid Other | Attending: Internal Medicine | Admitting: Internal Medicine

## 2023-09-07 VITALS — BP 115/57 | HR 73 | Temp 98.0°F | Ht 70.0 in | Wt 204.0 lb

## 2023-09-07 DIAGNOSIS — I700 Atherosclerosis of aorta: Secondary | ICD-10-CM | POA: Diagnosis not present

## 2023-09-07 DIAGNOSIS — M25512 Pain in left shoulder: Secondary | ICD-10-CM

## 2023-09-07 DIAGNOSIS — E222 Syndrome of inappropriate secretion of antidiuretic hormone: Secondary | ICD-10-CM | POA: Diagnosis not present

## 2023-09-07 DIAGNOSIS — G8929 Other chronic pain: Secondary | ICD-10-CM

## 2023-09-07 DIAGNOSIS — M62838 Other muscle spasm: Secondary | ICD-10-CM | POA: Diagnosis not present

## 2023-09-07 DIAGNOSIS — Z23 Encounter for immunization: Secondary | ICD-10-CM

## 2023-09-07 DIAGNOSIS — Z7984 Long term (current) use of oral hypoglycemic drugs: Secondary | ICD-10-CM

## 2023-09-07 DIAGNOSIS — R233 Spontaneous ecchymoses: Secondary | ICD-10-CM

## 2023-09-07 DIAGNOSIS — E119 Type 2 diabetes mellitus without complications: Secondary | ICD-10-CM

## 2023-09-07 DIAGNOSIS — I152 Hypertension secondary to endocrine disorders: Secondary | ICD-10-CM

## 2023-09-07 DIAGNOSIS — I2510 Atherosclerotic heart disease of native coronary artery without angina pectoris: Secondary | ICD-10-CM

## 2023-09-07 DIAGNOSIS — E1159 Type 2 diabetes mellitus with other circulatory complications: Secondary | ICD-10-CM | POA: Diagnosis not present

## 2023-09-07 DIAGNOSIS — I2584 Coronary atherosclerosis due to calcified coronary lesion: Secondary | ICD-10-CM

## 2023-09-07 DIAGNOSIS — D509 Iron deficiency anemia, unspecified: Secondary | ICD-10-CM | POA: Diagnosis not present

## 2023-09-07 LAB — GLUCOSE, POCT (MANUAL RESULT ENTRY): POC Glucose: 96 mg/dL (ref 70–99)

## 2023-09-07 LAB — POCT GLYCOSYLATED HEMOGLOBIN (HGB A1C): HbA1c, POC (controlled diabetic range): 5.2 % (ref 0.0–7.0)

## 2023-09-07 MED ORDER — GLIPIZIDE 5 MG PO TABS
2.5000 mg | ORAL_TABLET | Freq: Two times a day (BID) | ORAL | 1 refills | Status: DC
  Filled 2023-09-07 – 2023-09-25 (×2): qty 90, 90d supply, fill #0
  Filled 2024-04-25: qty 90, 90d supply, fill #1

## 2023-09-07 MED ORDER — LISINOPRIL 20 MG PO TABS
20.0000 mg | ORAL_TABLET | Freq: Two times a day (BID) | ORAL | 1 refills | Status: DC
Start: 2023-09-07 — End: 2024-02-21
  Filled 2023-09-07: qty 180, 90d supply, fill #0
  Filled 2023-12-11: qty 180, 90d supply, fill #1

## 2023-09-07 MED ORDER — CYCLOBENZAPRINE HCL 10 MG PO TABS
10.0000 mg | ORAL_TABLET | Freq: Two times a day (BID) | ORAL | 1 refills | Status: DC | PRN
  Filled 2023-09-07: qty 180, 90d supply, fill #0
  Filled 2023-09-23: qty 60, 30d supply, fill #0
  Filled 2023-10-30: qty 60, 30d supply, fill #1
  Filled 2023-11-28: qty 60, 30d supply, fill #2
  Filled 2023-12-24: qty 60, 30d supply, fill #3
  Filled 2024-01-30: qty 60, 30d supply, fill #4
  Filled ????-??-??: fill #4

## 2023-09-07 MED ORDER — ROPINIROLE HCL 1 MG PO TABS
1.0000 mg | ORAL_TABLET | Freq: Every day | ORAL | 1 refills | Status: DC
  Filled 2023-09-07 – 2023-10-03 (×2): qty 90, 90d supply, fill #0
  Filled 2024-01-08: qty 90, 90d supply, fill #1

## 2023-09-07 NOTE — Patient Instructions (Signed)
Decrease his Glucotrol to 5 mg half a tablet twice a day.

## 2023-09-07 NOTE — Progress Notes (Signed)
Patient ID: Jeremy [name], male    DOB: 16-Aug-1961  MRN: [id number]  CC: Diabetes (DM f/u. Med refill. /Reports that headaches are getting better - suspects that it is BP related./Discuss flu vax - reports fever yesterday)   Subjective: Jeremy [name] is a 62 y.o. male who presents for chronic ds management. His concerns today include:  Pt with hx of DM, HL, HTN,GERD, IDA (Barrett's esophagus and moderate gastritis thought to be NSAID induced on EGD; iron infusions by Dr. 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  Myoview neg 02/2021   DM: Results for orders placed or performed in visit on 09/07/23  POCT glycosylated hemoglobin (Hb A1C)  Result Value Ref Range   Hemoglobin A1C     HbA1c POC (<> result, manual entry)     HbA1c, POC (prediabetic range)     HbA1c, POC (controlled diabetic range) 5.2 0.0 - 7.0 %  POCT glucose (manual entry)  Result Value Ref Range   POC Glucose 96 70 - 99 mg/dl   *Note: Due to a large number of results and/or encounters for the requested time period, some results have not been displayed. A complete set of results can be found in Results Review.  On Glucotrol 5 mg BID.  Not checking BS consistent. Wakes occasional with low BS.  Sometimes he holds off on night time dose. Doing well with eating habits.  Wgh up 7 lbs since last visit in 4 mths HTN/CAD/aortic atherosclerosis: He had contacted me several weeks ago about elevated blood pressure and headache.  We added amlodipine 5 mg daily to the lisinopril 20 mg twice a day.  He is taking both medications.  Headache had stopped.  Has a slight headache today for the first time in a while.   Not limiting salt Self stopped statin (Lipitor) a while back because of jt aches.  Last LDL 1 yr ago was 39. Had seen endocrinology in the past for SIADH.  Due for chemistry for repeat  sodium level checked. Anemia: saw Dr. Arbutus Ped yesterday for follow-up on iron deficiency anemia.  Blood test drawn.  His ferritin has improved from 10 to now 35 even though it is still low.  H/H is stable.  Most recent value was 12.5/36.5 Taking iron supplement BID  LT shoulder pain: Saw Dr. Shon Baton in the summer.  Given steroid injection which she states helped a lot.  Seen in follow-up last month and had reinjured the shoulder.  MRI was ordered.  This revealed tear of the anterior and far lateral supraspinatus and moderate acromioclavicular osteoarthritis.  Patient tells me that Dr. Shon Baton did call him with the report.  Patient is wanting to avoid surgery.  States that his shoulder is a little better.  Some days it does not bother him at all and other days pain is about 5 out of 10.  He is on tramadol for pain management for osteoarthritis and lower back pain.  Reports he is doing okay with the tramadol.  Reports easy bruising of the skin.  Not on aspirin.  Platelet counts have been normal.  HM:  Fever yesterday at the hematology visit.  Had fever after shingles shot for 2 days and soreness.  Wanting to know if it is okay to get flu shot today. Patient Active Problem List   Diagnosis Date Noted   Anastomotic stricture of  gastrojejunostomy 04/07/2023   PUD (peptic ulcer disease) 04/02/2023   Anemia 04/02/2023   Marginal ulcer 03/12/2023   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   Coffee ground emesis 03/11/2023   NSAID long-term use 03/11/2023   Anemia, posthemorrhagic, acute 03/11/2023   Upper GI bleeding 03/10/2023   Hypertension associated with type 2 diabetes mellitus (HCC) 01/15/2023   Coronary artery disease of native artery of native heart with stable angina pectoris (HCC) 01/15/2023   Constipation 10/25/2022   Upper abdominal pain 07/06/2022   Nausea and vomiting 07/06/2022   Hyponatremia 06/23/2021   Low serum vitamin B12 03/23/2021   Vitamin D deficiency 03/23/2021   S/P  bypass gastrojejunostomy 01/03/2021   Gastric outlet obstruction    Duodenal stricture    Primary osteoarthritis of both hips 11/15/2020   Aortic atherosclerosis (HCC) 08/30/2020   Coronary artery calcification 08/30/2020   Bilateral carpal tunnel syndrome 06/07/2020   Grief reaction 06/07/2020   Pyloric stenosis in adult 02/05/2020   Congenital hypertrophic pyloric stenosis    Duodenal web    Barrett's esophagus without dysplasia 05/19/2019   OSA (obstructive sleep apnea) 04/30/2019   Iron deficiency anemia 11/25/2018   Phimosis of penis 11/19/2018   Esophageal dysphagia 08/19/2018   Benign prostatic hyperplasia with post-void dribbling 01/31/2018   Absence of bladder continence 01/31/2018   Obesity (BMI 30-39.9) 11/05/2017   GERD (gastroesophageal reflux disease) 06/03/2017   Chronic pain of both knees 06/03/2017   Chronic lower back pain 06/03/2017   Type 2 diabetes mellitus without complication, without long-term current use of insulin (HCC) 04/12/2016   Essential hypertension 04/12/2016     Current Outpatient Medications on File Prior to Visit  Medication Sig Dispense Refill   Accu-Chek Softclix Lancets lancets Use to check blood sugar three times daily. 100 each 2   acetaminophen (TYLENOL) 500 MG tablet Take 1,000 mg by mouth in the morning and at bedtime.      amLODipine (NORVASC) 5 MG tablet Take 1 tablet (5 mg total) by mouth daily. 90 tablet 1   ascorbic acid (VITAMIN C) 500 MG tablet Take 500 mg by mouth 2 (two) times daily.     Blood Glucose Monitoring Suppl (ACCU-CHEK GUIDE) w/Device KIT Use to check blood sugar three times daily. 1 kit 0   Cyanocobalamin (VITAMIN B-12 PO) Take 2,500 mcg by mouth daily.     cyclobenzaprine (FLEXERIL) 10 MG tablet Take 1 tablet (10 mg total) by mouth 2 (two) times daily as needed for muscle spasms. 60 tablet 0   ferrous sulfate (FEROSUL) 325 (65 FE) MG tablet Take 1 tablet (325 mg total) by mouth 2 (two) times daily with a meal. 180  tablet 0   glipiZIDE (GLUCOTROL) 5 MG tablet Take 1 tablet (5 mg total) in the morning AND 1 tablet (5 mg total) every evening. 180 tablet 0   lisinopril (ZESTRIL) 20 MG tablet Take 1 tablet (20 mg total) by mouth in the morning and at bedtime. 180 tablet 0   loratadine (CLARITIN) 10 MG tablet Take 10 mg by mouth daily.     Magnesium Oxide 200 MG TABS Take 200 mg by mouth in the morning and at bedtime.     Multiple Vitamins-Minerals (MULTIVITAMIN WITH MINERALS) tablet Take 1 tablet by mouth 2 (two) times daily.     ondansetron (ZOFRAN) 8 MG tablet Take 1 tablet (8 mg total) by mouth daily as needed for nausea or vomiting. 30 tablet 2   pantoprazole (PROTONIX) 40 MG tablet Take 1 tablet (40  mg total) by mouth 2 (two) times daily before a meal. 60 tablet 5   Potassium 99 MG TABS Take 198 mg by mouth 2 (two) times daily.     rOPINIRole (REQUIP) 1 MG tablet Take 1 tablet (1 mg total) by mouth at bedtime. 90 tablet 1   sildenafil (VIAGRA) 50 MG tablet Take 1 tablet (50 mg total) by mouth 1/2 hr before intercourse.  Limit use to 1 tab/24 hr 10 tablet 3   traMADol (ULTRAM) 50 MG tablet Take 2 tablets (100 mg total) by mouth every 12 (twelve) hours as needed (to fill on or after 05/20/2023). 120 tablet 2   fluticasone (FLONASE) 50 MCG/ACT nasal spray Place 1 spray into both nostrils daily as needed for allergies or rhinitis. (Patient not taking: Reported on 09/07/2023) 16 g 2   No current facility-administered medications on file prior to visit.    Allergies  Allergen Reactions   Cymbalta [Duloxetine Hcl] Other (See Comments)    Caused anorgasmia   Doxycycline     Unknown   Other Nausea And Vomiting    Mayonnaise mustard ketchup   Robaxin [Methocarbamol] Other (See Comments)    Had hiccups x 4 hours after taking    Social History   Socioeconomic History   Marital status: Significant Other    Spouse name: Not on file   Number of children: Not on file   Years of education: Not on file    Highest education level: Not on file  Occupational History   Occupation: Product/process development scientist  Tobacco Use   Smoking status: Former    Current packs/day: 0.00    Types: Cigarettes    Quit date: 10/16/1988    Years since quitting: 34.9   Smokeless tobacco: Never  Vaping Use   Vaping status: Never Used  Substance and Sexual Activity   Alcohol use: No   Drug use: Yes    Comment: rare   Sexual activity: Not on file  Other Topics Concern   Not on file  Social History Narrative   Volunteers as IT sales professional   Social Determinants of Health   Financial Resource Strain: Not on file  Food Insecurity: No Food Insecurity (03/10/2023)   Hunger Vital Sign    Worried About Running Out of Food in the Last Year: Never true    Ran Out of Food in the Last Year: Never true  Transportation Needs: No Transportation Needs (03/10/2023)   PRAPARE - Administrator, Civil Service (Medical): No    Lack of Transportation (Non-Medical): No  Physical Activity: Not on file  Stress: Not on file  Social Connections: Not on file  Intimate Partner Violence: Not At Risk (03/10/2023)   Humiliation, Afraid, Rape, and Kick questionnaire    Fear of Current or Ex-Partner: No    Emotionally Abused: No    Physically Abused: No    Sexually Abused: No    Family History  Problem Relation Age of Onset   Breast cancer Mother    Pancreatic cancer Mother    CAD Father    Colon cancer Paternal Grandfather     Past Surgical History:  Procedure Laterality Date   BACK SURGERY  1993   lumbar   BALLOON DILATION N/A 04/25/2023   Procedure: BALLOON DILATION;  Surgeon: Lanelle Bal, DO;  Location: [facility];  Service: Endoscopy;  Laterality: N/A;   BIOPSY  02/07/2019   Procedure: BIOPSY;  Surgeon: West Bali, MD;  Location: [facility];  Service: Endoscopy;;   BIOPSY  01/30/2022   Procedure: BIOPSY;  Surgeon: Lanelle Bal, DO;  Location: [facility];  Service: Endoscopy;;   BIOPSY   04/25/2023   Procedure: BIOPSY;  Surgeon: Lanelle Bal, DO;  Location: [facility];  Service: Endoscopy;;   CERVICAL SPINE SURGERY  2000   COLONOSCOPY N/A 02/07/2019   Dr. Darrick Penna: External hemorrhoids next colonoscopy in 10 years   ESOPHAGOGASTRODUODENOSCOPY N/A 02/07/2019   Dr. Darrick Penna: Barrett's esophagus without dysplasia chronic inactive gastritis but no H. 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, nonbleeding duodenal diverticulum,   ESOPHAGOGASTRODUODENOSCOPY N/A 01/05/2020   Procedure: ESOPHAGOGASTRODUODENOSCOPY (EGD);  Surgeon: West Bali, MD;  Location: [facility];  Service: Endoscopy;  Laterality: N/A;  10:30am   ESOPHAGOGASTRODUODENOSCOPY (EGD) WITH PROPOFOL N/A 12/06/2020   Procedure: ESOPHAGOGASTRODUODENOSCOPY (EGD) WITH PROPOFOL;  Surgeon: Lanelle Bal, DO;  Location: [facility];  Service: Endoscopy;  Laterality: N/A;  11:45am   ESOPHAGOGASTRODUODENOSCOPY (EGD) WITH PROPOFOL N/A 12/22/2021   long-segment Barrett's s/p biopsy, gastric bypass with normal sized puch and intact staple line. GJ anastomosis with healthy mucosa. Gastritis.   ESOPHAGOGASTRODUODENOSCOPY (EGD) WITH PROPOFOL N/A 01/30/2022   Procedure: ESOPHAGOGASTRODUODENOSCOPY (EGD) WITH PROPOFOL;  Surgeon: Lanelle Bal, DO;  Location: [facility];  Service: Endoscopy;  Laterality: N/A;  11:00am   ESOPHAGOGASTRODUODENOSCOPY (EGD) WITH PROPOFOL N/A 03/11/2023   Procedure: ESOPHAGOGASTRODUODENOSCOPY (EGD) WITH PROPOFOL;  Surgeon: Benancio Deeds, MD;  [facility];  Service: Gastroenterology;  Laterality: N/A;   ESOPHAGOGASTRODUODENOSCOPY (EGD) WITH PROPOFOL N/A 04/25/2023   Procedure: ESOPHAGOGASTRODUODENOSCOPY (EGD) WITH PROPOFOL;  Surgeon: Lanelle Bal, DO;  Location: [facility];  Service: Endoscopy;  Laterality: N/A;  10:30 am, asa 3   GASTROJEJUNOSTOMY N/A 01/03/2021   Procedure: GASTROJEJUNOSTOMY;  Surgeon: [name], MD;  Location: [facility];  Service: General;  Laterality: N/A;   HAND SURGERY     SAVORY DILATION N/A 01/05/2020   Procedure: SAVORY DILATION;  Surgeon: West Bali, MD;  Location: [facility];  Service: Endoscopy;  Laterality: N/A;    ROS: Review of Systems Negative except as stated above  PHYSICAL EXAM: BP (!) 115/57 (BP Location: Left Arm, Patient Position: Sitting, Cuff Size: Large)   Pulse 73   Temp 98 F (36.7 C) (Oral)   Ht 5\' 10"  (1.778 m)   Wt 204 lb (92.5 kg)   SpO2 96%   BMI 29.27 kg/m   Wt Readings from Last 3 Encounters:  09/07/23 204 lb (92.5 kg)  09/06/23 203 lb 4.8 oz (92.2 kg)  09/03/23 202 lb (91.6 kg)  ' Physical Exam   General appearance - alert, well appearing, and in no distress Mental status - normal mood, behavior, speech, dress, motor activity, and thought processes Neck - supple, no significant adenopathy Chest - clear to auscultation, no wheezes, rales or rhonchi, symmetric air entry Heart - normal rate, regular rhythm, normal S1, S2, no murmurs, rubs, clicks or gallops Extremities - peripheral pulses normal, no pedal edema, no clubbing or cyanosis Skin -small scattered ecchymosis on the arms more so left side.     Latest Ref Rng & Units 04/23/2023   10:39 AM 03/12/2023    7:40 AM 03/11/2023    5:43 AM  CMP  Glucose 70 - 99 mg/dL 161  096  045   BUN 6 - 20 mg/dL 13  11  21    Creatinine 0.61 - 1.24 mg/dL 4.09  0.95  0.91   Sodium 135 - 145 mmol/L 134  132  126   Potassium 3.5 - 5.1 mmol/L 3.7  3.9  4.3   Chloride 98 - 111 mmol/L 101  103  99   CO2 22 - 32 mmol/L 24  23  20    Calcium 8.9 - 10.3 mg/dL 9.2  8.5  8.0   Total Protein 6.5 - 8.1 g/dL   5.6   Total Bilirubin 0.3 - 1.2 mg/dL   0.7   Alkaline Phos 38 - 126 U/L   51   AST 15 - 41 U/L   18   ALT 0 - 44 U/L   23    Lipid Panel     Component Value Date/Time   CHOL 87 (L) 05/22/2022 1552   TRIG 64 05/22/2022 1552   HDL 34 (L) 05/22/2022 1552   CHOLHDL 2.6 05/22/2022 1552    CHOLHDL 4.8 10/17/2016 1012   VLDL NOT CALC 10/17/2016 1012   LDLCALC 39 05/22/2022 1552    CBC    Component Value Date/Time   WBC 4.7 09/06/2023 1012   WBC 5.1 04/23/2023 1039   RBC 3.77 (L) 09/06/2023 1012   HGB 12.5 (L) 09/06/2023 1012   HGB 10.0 (L) 05/17/2021 1657   HCT 36.5 (L) 09/06/2023 1012   HCT 31.6 (L) 05/17/2021 1657   PLT 223 09/06/2023 1012   PLT 236 05/17/2021 1657   MCV 96.8 09/06/2023 1012   MCV 89 05/17/2021 1657   MCH 33.2 09/06/2023 1012   MCHC 34.2 09/06/2023 1012   RDW 12.4 09/06/2023 1012   RDW 16.4 (H) 05/17/2021 1657   LYMPHSABS 1.1 09/06/2023 1012   LYMPHSABS 2.0 08/13/2019 1617   MONOABS 0.5 09/06/2023 1012   EOSABS 0.3 09/06/2023 1012   EOSABS 0.4 08/13/2019 1617   BASOSABS 0.1 09/06/2023 1012   BASOSABS 0.1 08/13/2019 1617    ASSESSMENT AND PLAN: 1. Type 2 diabetes mellitus with other circulatory complication, without long-term current use of insulin (HCC) -At goal.  Given that his A1c is well below 7, I recommend decreasing glipizide to 2.5 mg twice a day. - POCT glycosylated hemoglobin (Hb A1C) - POCT glucose (manual entry) - Microalbumin / creatinine urine ratio - Comprehensive metabolic panel - glipiZIDE (GLUCOTROL) 5 MG tablet; Take 0.5 tablets (2.5 mg total) by mouth 2 (two) times daily before a meal.  Dispense: 90 tablet; Refill: 1  2. Diabetes mellitus treated with oral medication (HCC)   3. Hypertension associated with type 2 diabetes mellitus (HCC) At goal.  Continue lisinopril 20 mg twice a day and Norvasc 5 mg daily. - lisinopril (ZESTRIL) 20 MG tablet; Take 1 tablet (20 mg total) by mouth in the morning and at bedtime.  Dispense: 180 tablet; Refill: 1  4. Coronary artery disease due to calcified coronary lesion Ideally he should be on statin regardless of LDL level.  Patient would like to have lipid profile rechecked first to see where his level is at.  States that he would be agreeable to restarting statin if  necessary. - Lipid panel - Comprehensive metabolic panel  5. Iron deficiency anemia, unspecified iron deficiency anemia type Continue iron supplement  6. Aortic atherosclerosis (HCC) See #4 above  7. Chronic left shoulder pain Followed by orthopedics.  Continue tramadol as needed.  8. SIADH (syndrome of inappropriate ADH production) (HCC) Prior history of SIADH.  We will check chemistry today recheck sodium level  9. Muscle spasm Patient requested refill on Flexeril. - cyclobenzaprine (FLEXERIL)  10 MG tablet; Take 1 tablet (10 mg total) by mouth 2 (two) times daily as needed for muscle spasms.  Dispense: 180 tablet; Refill: 1  10. Easy bruising - PT AND PTT  11. Encounter for immunization - Flu vaccine trivalent PF, 6mos and older(Flulaval,Afluria,Fluarix,Fluzone)   Patient was given the opportunity to ask questions.  Patient verbalized understanding of the plan and was able to repeat key elements of the plan.   This documentation was completed using Paediatric nurse.  Any transcriptional errors are unintentional.  Orders Placed This Encounter  Procedures   POCT glycosylated hemoglobin (Hb A1C)   POCT glucose (manual entry)     Requested Prescriptions   Pending Prescriptions Disp Refills   cyclobenzaprine (FLEXERIL) 10 MG tablet 180 tablet 1    Sig: Take 1 tablet (10 mg total) by mouth 2 (two) times daily as needed for muscle spasms.   lisinopril (ZESTRIL) 20 MG tablet 180 tablet 1    Sig: Take 1 tablet (20 mg total) by mouth in the morning and at bedtime.   rOPINIRole (REQUIP) 1 MG tablet 90 tablet 1    Sig: Take 1 tablet (1 mg total) by mouth at bedtime.   traMADol (ULTRAM) 50 MG tablet 120 tablet 2    Sig: Take 2 tablets (100 mg total) by mouth every 12 (twelve) hours as needed (to fill on or after 05/20/2023).    No follow-ups on file.  Jonah Blue, MD, FACP

## 2023-09-08 LAB — COMPREHENSIVE METABOLIC PANEL
ALT: 56 [IU]/L — ABNORMAL HIGH (ref 0–44)
AST: 40 [IU]/L (ref 0–40)
Albumin: 4.4 g/dL (ref 3.8–4.9)
Alkaline Phosphatase: 102 [IU]/L (ref 44–121)
BUN/Creatinine Ratio: 19 (ref 10–24)
BUN: 15 mg/dL (ref 8–27)
Bilirubin Total: <0.2 mg/dL (ref 0.0–1.2)
CO2: 23 mmol/L (ref 20–29)
Calcium: 9.6 mg/dL (ref 8.6–10.2)
Chloride: 102 mmol/L (ref 96–106)
Creatinine, Ser: 0.78 mg/dL (ref 0.76–1.27)
Globulin, Total: 2.4 g/dL (ref 1.5–4.5)
Glucose: 92 mg/dL (ref 70–99)
Potassium: 4.3 mmol/L (ref 3.5–5.2)
Sodium: 140 mmol/L (ref 134–144)
Total Protein: 6.8 g/dL (ref 6.0–8.5)
eGFR: 102 mL/min/{1.73_m2} (ref >59)

## 2023-09-08 LAB — LIPID PANEL
Chol/HDL Ratio: 2.6 ratio (ref 0.0–5.0)
Cholesterol, Total: 116 mg/dL (ref 100–199)
HDL: 44 mg/dL (ref >39)
LDL Chol Calc (NIH): 48 mg/dL (ref 0–99)
Triglycerides: 141 mg/dL (ref 0–149)
VLDL Cholesterol Cal: 24 mg/dL (ref 5–40)

## 2023-09-08 LAB — PT AND PTT
INR: 1 (ref 0.9–1.2)
Prothrombin Time: 11.4 s (ref 9.1–12.0)
aPTT: 30 s (ref 24–33)

## 2023-09-10 ENCOUNTER — Other Ambulatory Visit: Payer: Self-pay | Admitting: Internal Medicine

## 2023-09-10 DIAGNOSIS — D509 Iron deficiency anemia, unspecified: Secondary | ICD-10-CM

## 2023-09-10 MED ORDER — FERROUS SULFATE 325 (65 FE) MG PO TABS
325.0000 mg | ORAL_TABLET | Freq: Two times a day (BID) | ORAL | 1 refills | Status: DC
  Filled 2023-09-17 – 2023-09-23 (×2): qty 180, 90d supply, fill #0
  Filled 2023-12-22: qty 180, 90d supply, fill #1

## 2023-09-11 ENCOUNTER — Other Ambulatory Visit: Payer: Self-pay

## 2023-09-12 ENCOUNTER — Other Ambulatory Visit: Payer: Self-pay

## 2023-09-12 LAB — MICROALBUMIN / CREATININE URINE RATIO
Creatinine, Urine: 110.8 mg/dL
Microalb/Creat Ratio: 3 mg/g{creat} (ref 0–29)
Microalbumin, Urine: 3.1 ug/mL

## 2023-09-16 ENCOUNTER — Encounter: Payer: Self-pay | Admitting: Internal Medicine

## 2023-09-17 ENCOUNTER — Encounter: Payer: Self-pay | Admitting: Physician Assistant

## 2023-09-17 ENCOUNTER — Other Ambulatory Visit: Payer: Self-pay | Admitting: Internal Medicine

## 2023-09-17 ENCOUNTER — Other Ambulatory Visit: Payer: Self-pay

## 2023-09-17 DIAGNOSIS — M160 Bilateral primary osteoarthritis of hip: Secondary | ICD-10-CM

## 2023-09-17 DIAGNOSIS — M5136 Other intervertebral disc degeneration, lumbar region with discogenic back pain only: Secondary | ICD-10-CM

## 2023-09-18 NOTE — Telephone Encounter (Signed)
Routing to [place] as she last saw patient.

## 2023-09-19 NOTE — Telephone Encounter (Signed)
Spoke with patient. Reports waking up yesterday with a pain in his right upper to right mid abdomen like he hadn't eaten in a while, like a gnawing pain.  He ended up eating a pack of crackers, taking Carafate, and the pain eventually resolved.  He was able to eat last night without any problems.  No recurrent problems today.  Denies nausea, vomiting.  Bowels are moving well.  Denies NSAIDs.  Advised him we will monitor for now as this was a one-time occurrence.  Difficult to say what the cause was.  He has been drinking quite a bit of coffee recently, about 30 ounces a day.  Advised that he reduce this for now.  If he has recurrent abdominal pain, he will let me know, and I will plan to see him in the office for further evaluation.

## 2023-09-20 ENCOUNTER — Other Ambulatory Visit: Payer: Self-pay

## 2023-09-24 ENCOUNTER — Other Ambulatory Visit: Payer: Self-pay

## 2023-09-24 ENCOUNTER — Encounter: Payer: Self-pay | Admitting: Physician Assistant

## 2023-09-25 ENCOUNTER — Other Ambulatory Visit: Payer: Self-pay

## 2023-09-25 ENCOUNTER — Encounter: Payer: Self-pay | Admitting: Physician Assistant

## 2023-09-28 ENCOUNTER — Other Ambulatory Visit: Payer: Self-pay

## 2023-10-01 ENCOUNTER — Other Ambulatory Visit: Payer: Self-pay

## 2023-10-03 ENCOUNTER — Other Ambulatory Visit: Payer: Self-pay

## 2023-10-03 ENCOUNTER — Encounter: Payer: Self-pay | Admitting: Physician Assistant

## 2023-10-04 ENCOUNTER — Other Ambulatory Visit: Payer: Self-pay

## 2023-10-11 ENCOUNTER — Ambulatory Visit: Payer: Medicaid Other | Admitting: Internal Medicine

## 2023-10-11 DIAGNOSIS — M25512 Pain in left shoulder: Secondary | ICD-10-CM | POA: Diagnosis not present

## 2023-10-11 DIAGNOSIS — M545 Low back pain, unspecified: Secondary | ICD-10-CM | POA: Diagnosis not present

## 2023-10-11 DIAGNOSIS — M961 Postlaminectomy syndrome, not elsewhere classified: Secondary | ICD-10-CM | POA: Diagnosis not present

## 2023-10-11 DIAGNOSIS — M5451 Vertebrogenic low back pain: Secondary | ICD-10-CM | POA: Diagnosis not present

## 2023-10-11 DIAGNOSIS — G8929 Other chronic pain: Secondary | ICD-10-CM | POA: Diagnosis not present

## 2023-10-11 DIAGNOSIS — M542 Cervicalgia: Secondary | ICD-10-CM | POA: Diagnosis not present

## 2023-10-15 ENCOUNTER — Other Ambulatory Visit: Payer: Self-pay

## 2023-10-17 ENCOUNTER — Other Ambulatory Visit: Payer: Self-pay | Admitting: Gastroenterology

## 2023-10-17 DIAGNOSIS — K2100 Gastro-esophageal reflux disease with esophagitis, without bleeding: Secondary | ICD-10-CM

## 2023-10-17 MED ORDER — PANTOPRAZOLE SODIUM 40 MG PO TBEC
40.0000 mg | DELAYED_RELEASE_TABLET | Freq: Two times a day (BID) | ORAL | 5 refills | Status: DC
  Filled 2023-10-17 – 2023-11-13 (×2): qty 60, 30d supply, fill #0
  Filled 2023-12-11: qty 60, 30d supply, fill #1
  Filled 2024-01-08: qty 60, 30d supply, fill #2
  Filled 2024-02-18: qty 60, 30d supply, fill #3
  Filled 2024-03-04 – 2024-03-17 (×2): qty 60, 30d supply, fill #4
  Filled 2024-04-08 – 2024-04-22 (×2): qty 60, 30d supply, fill #5

## 2023-10-18 ENCOUNTER — Other Ambulatory Visit: Payer: Self-pay

## 2023-10-19 DIAGNOSIS — M5136 Other intervertebral disc degeneration, lumbar region with discogenic back pain only: Secondary | ICD-10-CM | POA: Diagnosis not present

## 2023-10-19 DIAGNOSIS — M50321 Other cervical disc degeneration at C4-C5 level: Secondary | ICD-10-CM | POA: Diagnosis not present

## 2023-10-19 DIAGNOSIS — Z981 Arthrodesis status: Secondary | ICD-10-CM | POA: Diagnosis not present

## 2023-10-19 DIAGNOSIS — M47812 Spondylosis without myelopathy or radiculopathy, cervical region: Secondary | ICD-10-CM | POA: Diagnosis not present

## 2023-10-19 DIAGNOSIS — M4316 Spondylolisthesis, lumbar region: Secondary | ICD-10-CM | POA: Diagnosis not present

## 2023-10-30 ENCOUNTER — Other Ambulatory Visit: Payer: Self-pay

## 2023-10-30 ENCOUNTER — Encounter: Payer: Self-pay | Admitting: Physical Therapy

## 2023-10-30 ENCOUNTER — Ambulatory Visit: Payer: Medicaid Other | Attending: Sports Medicine | Admitting: Physical Therapy

## 2023-10-30 DIAGNOSIS — M6281 Muscle weakness (generalized): Secondary | ICD-10-CM | POA: Insufficient documentation

## 2023-10-30 DIAGNOSIS — M542 Cervicalgia: Secondary | ICD-10-CM | POA: Diagnosis not present

## 2023-10-30 DIAGNOSIS — M5459 Other low back pain: Secondary | ICD-10-CM | POA: Diagnosis not present

## 2023-10-30 NOTE — Therapy (Addendum)
 OUTPATIENT PHYSICAL THERAPY THORACOLUMBAR EVALUATION  Patient Name: Jeremy [name] MRN: [id number] DOB:Sep 20, 1961, 63 y.o., male Today's Date: 10/31/2023   PT End of Session - 10/31/23 0852     Visit Number 1    Number of Visits --   1-2x/week   Date for PT Re-Evaluation 12/26/23    Authorization Type [employer] - FOTO    PT Start Time 0330    PT Stop Time 0410    PT Time Calculation (min) 40 min             Past Medical History:  Diagnosis Date   Diabetes mellitus without complication (HCC)    GERD (gastroesophageal reflux disease)    Heart murmur    Hyperlipidemia    Hypertension    IDA (iron  deficiency anemia)    Rash of entire body 03/2016   Sleep apnea    Past Surgical History:  Procedure Laterality Date   BACK SURGERY  1993   lumbar   BALLOON DILATION N/A 04/25/2023   Procedure: BALLOON DILATION;  Surgeon: Cindie Carlin POUR, DO;  Location: [facility];  Service: Endoscopy;  Laterality: N/A;   BIOPSY  02/07/2019   Procedure: BIOPSY;  Surgeon: Harvey Margo LITTIE, MD;  Location: [facility];  Service: Endoscopy;;   BIOPSY  01/30/2022   Procedure: BIOPSY;  Surgeon: Cindie Carlin POUR, DO;  Location: [facility];  Service: Endoscopy;;   BIOPSY  04/25/2023   Procedure: BIOPSY;  Surgeon: Cindie Carlin POUR, DO;  Location: [facility];  Service: Endoscopy;;   CERVICAL SPINE SURGERY  2000   COLONOSCOPY N/A 02/07/2019   Dr. Harvey: External hemorrhoids next colonoscopy in 10 years   ESOPHAGOGASTRODUODENOSCOPY N/A 02/07/2019   Dr. Harvey: Barrett's esophagus without dysplasia chronic inactive gastritis but no H. 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, nonbleeding duodenal diverticulum,   ESOPHAGOGASTRODUODENOSCOPY N/A 01/05/2020   Procedure: ESOPHAGOGASTRODUODENOSCOPY (EGD);  Surgeon: Harvey Margo LITTIE, MD;  [facility]: [facility];  Service: Endoscopy;  Laterality: N/A;  10:30am   ESOPHAGOGASTRODUODENOSCOPY  (EGD) WITH PROPOFOL  N/A 12/06/2020   Procedure: ESOPHAGOGASTRODUODENOSCOPY (EGD) WITH PROPOFOL ;  Surgeon: Cindie Carlin POUR, DO;  [facility]: [facility];  Service: Endoscopy;  Laterality: N/A;  11:45am   ESOPHAGOGASTRODUODENOSCOPY (EGD) WITH PROPOFOL  N/A 12/22/2021   long-segment Barrett's s/p biopsy, gastric bypass with normal sized puch and intact staple line. GJ anastomosis with healthy mucosa. Gastritis.   ESOPHAGOGASTRODUODENOSCOPY (EGD) WITH PROPOFOL  N/A 01/30/2022   Procedure: ESOPHAGOGASTRODUODENOSCOPY (EGD) WITH PROPOFOL ;  Surgeon: Cindie Carlin POUR, DO;  [facility]: [facility];  Service: Endoscopy;  Laterality: N/A;  11:00am   ESOPHAGOGASTRODUODENOSCOPY (EGD) WITH PROPOFOL  N/A 03/11/2023   Procedure: ESOPHAGOGASTRODUODENOSCOPY (EGD) WITH PROPOFOL ;  Surgeon: Leigh Elspeth SQUIBB, MD;  Location: [facility];  Service: Gastroenterology;  Laterality: N/A;   ESOPHAGOGASTRODUODENOSCOPY (EGD) WITH PROPOFOL  N/A 04/25/2023   Procedure: ESOPHAGOGASTRODUODENOSCOPY (EGD) WITH PROPOFOL ;  Surgeon: Cindie Carlin POUR, DO;  [facility]: [facility];  Service: Endoscopy;  Laterality: N/A;  10:30 am, asa 3   GASTROJEJUNOSTOMY N/A 01/03/2021   Procedure: GASTROJEJUNOSTOMY;  Surgeon: Mavis Anes, MD;  Location: [facility];  Service: General;  Laterality: N/A;   HAND SURGERY     SAVORY DILATION N/A 01/05/2020   Procedure: SAVORY DILATION;  Surgeon: Harvey Margo LITTIE, MD;  Location: [facility];  Service: Endoscopy;  Laterality: N/A;   Patient Active Problem List   Diagnosis Date Noted   SIADH (syndrome of inappropriate ADH production) (HCC) 09/07/2023  Anastomotic stricture of gastrojejunostomy 04/07/2023   PUD (peptic ulcer disease) 04/02/2023   Anemia 04/02/2023   Marginal ulcer 03/12/2023   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   Coffee ground emesis 03/11/2023   NSAID long-term use 03/11/2023   Anemia, posthemorrhagic, acute 03/11/2023   Upper GI bleeding 03/10/2023   Hypertension associated  with type 2 diabetes mellitus (HCC) 01/15/2023   Constipation 10/25/2022   Upper abdominal pain 07/06/2022   Nausea and vomiting 07/06/2022   Hyponatremia 06/23/2021   Low serum vitamin B12 03/23/2021   Vitamin D deficiency 03/23/2021   S/P bypass gastrojejunostomy 01/03/2021   Gastric outlet obstruction    Duodenal stricture    Primary osteoarthritis of both hips 11/15/2020   Aortic atherosclerosis (HCC) 08/30/2020   Coronary artery calcification 08/30/2020   Bilateral carpal tunnel syndrome 06/07/2020   Grief reaction 06/07/2020   Pyloric stenosis in adult 02/05/2020   Congenital hypertrophic pyloric stenosis    Duodenal web    Barrett's esophagus without dysplasia 05/19/2019   OSA (obstructive sleep apnea) 04/30/2019   Iron  deficiency anemia 11/25/2018   Phimosis of penis 11/19/2018   Esophageal dysphagia 08/19/2018   Benign prostatic hyperplasia with post-void dribbling 01/31/2018   Absence of bladder continence 01/31/2018   Obesity (BMI 30-39.9) 11/05/2017   GERD (gastroesophageal reflux disease) 06/03/2017   Chronic pain of both knees 06/03/2017   Chronic lower back pain 06/03/2017   Type 2 diabetes mellitus without complication, without long-term current use of insulin  (HCC) 04/12/2016   Essential hypertension 04/12/2016    PCP: Vicci Barnie NOVAK, MD  REFERRING PROVIDER: Melton Durwood RAMAN, DO  THERAPY DIAG:  Other low back pain  Cervicalgia  Muscle weakness  REFERRING DIAG: Cervicalgia [M54.2], Vertebrogenic low back pain [M54.51]   Rationale for Evaluation and Treatment:  Rehabilitation  SUBJECTIVE:  PERTINENT PAST HISTORY:  DM TII, HTN, CAD, Cervical fusion C5-C7       PRECAUTIONS: None  WEIGHT BEARING RESTRICTIONS No  FALLS:  Has patient fallen in last 6 months? No, Number of falls: 0  MOI/History of condition:  Onset date: Chronic low back and neck pain since 1992  SUBJECTIVE STATEMENT  [name] is a 63 y.o. male who presents to  clinic with chief complaint of neck and low back pain.  He has a remote history of cervical fusion and a low back surgery which was not a fusion, but pt is unsure of what exactly was done.  He was taking a NSAID since 1992 but had to stop d/t GI issues.  He had a major GI surgery several year ago.  Since stopping the NSAIDs he has had significant discomfort.  He is working with pain management currently.  He feels like he has balance issue.  Denies n/t in UE and LE.  His hands do go to sleep in certain position.  He denies any gross weakness.   Red flags:  denies   Pain:  Are you having pain? Yes Pain location: Neck and low back NPRS scale:  3/10 to 9/10 Aggravating factors: repetitive motion and no motion Relieving factors:  rest and medication  Pain description: aching Stage: Chronic 24 hour pattern: worse at the end of the day   Occupation: Product/process Development Scientist Device: na  Hand Dominance: L  Patient Goals/Specific Activities: reduce pain   OBJECTIVE:   DIAGNOSTIC FINDINGS:  X-ray: 1.  No acute fracture. 2.  Levocurvature of the lumbar spine. 3.  Mild retrolisthesis of L1 on L2 that is exaggerated on extension  view. 4.  Moderate-severe multilevel degenerative disc disease. 5.  Severe lower lumbar facet arthropathy.   1.  No displaced fracture or traumatic malalignment. 2.  ACDF hardware at levels C5-C7 with osseous fusion. 3.  Moderate degenerative changes at the atlantoaxial joint. 4.  Moderate degenerative disc disease at C4-C5. Mild at remaining noninstrumented levels.     GENERAL OBSERVATION/GAIT: Abdominal doming  SENSATION: Light touch: Appears intact  Cervical ROM  ROM ROM  (Eval)  Flexion 40  Extension 40*  Right lateral flexion 30  Left lateral flexion 20  Right rotation 50  Left rotation 45  Flexion rotation (normal is 30 degrees)   Flexion rotation (normal is 30 degrees)     (Blank rows = not tested, N = WNL, * = concordant pain)   LUMBAR  AROM  AROM AROM  (Eval)  Flexion Fingertips to mid shin, w/ concordant pain  Extension limited by 50%, w/ concordant pain  Right lateral flexion limited by 50%  Left lateral flexion limited by 50%  Right rotation limited by 50%  Left rotation limited by 50%    (Blank rows = not tested)   LE MMT:  MMT Right (Eval) Left (Eval)  Hip flexion (L2, L3) 3+ 3+  Knee extension (L3) 3+ 3+  Knee flexion 3+ 3+  Hip abduction    Hip extension    Hip external rotation    Hip internal rotation    Hip adduction    Ankle dorsiflexion (L4) c c  Ankle plantarflexion (S1) c c  Ankle inversion    Ankle eversion    Great Toe ext (L5) c c  Grossly     (Blank rows = not tested, score listed is out of 5 possible points.  N = WNL, D = diminished, C = clear for gross weakness with myotome testing, * = concordant pain with testing)  UPPER EXTREMITY MMT:  MMT Right (Eval) Left (Eval)  Shoulder flexion 3+* 4  Shoulder abduction (C5) c c  Shoulder ER    Shoulder IR    Middle trapezius    Lower trapezius    Shoulder extension    Grip strength    Shoulder shrug (C4) c c  Elbow flexion (C6) c c  Elbow ext (C7) c c  Thumb ext (C8) c c  Finger abd (T1) c c  Grossly     (Blank rows = not tested, score listed is out of 5 possible points.  N = WNL, D = diminished, C = clear for gross weakness with myotome testing, * = concordant pain with testing)   SPECIAL TESTS:  Straight leg raise: L (-), R (-) Slump: L (~), R (-)  MUSCLE LENGTH: Hamstrings: Right subtle restriction; Left subtle restriction  LE ROM:  ROM Right (Eval) Left (Eval)  Hip flexion    Hip extension    Hip abduction    Hip adduction    Hip internal rotation    Hip external rotation    Knee flexion    Knee extension    Ankle dorsiflexion    Ankle plantarflexion    Ankle inversion    Ankle eversion      (Blank rows = not tested, N = WNL, * = concordant pain with testing)  Functional Tests  Eval    30'' STS:  7x  UE used? Y    Progressive balance screen (highest level completed for >/= 10''):   Tandem: R in rear 8'', L in rear 6''  PATIENT SURVEYS:  FOTO 48 -> 49   TODAY'S TREATMENT  Therapeutic Exercise: Creating, reviewing, and completing below HEP  PATIENT EDUCATION:  POC, diagnosis, prognosis, HEP, and outcome measures.  Pt educated via explanation, demonstration, and handout (HEP).  Pt confirms understanding verbally.   HOME EXERCISE PROGRAM: Access Code: [email or web address] URL: [email or web address] Date: 10/30/2023 Prepared by: Helene Gasmen  Exercises - Supine Lower Trunk Rotation  - 1 x daily - 7 x weekly - 1 sets - 20 reps - 3 hold - Supine Posterior Pelvic Tilt  - 2 x daily - 7 x weekly - 2 sets - 10 reps - 5-10 second hold hold - Supine Posterior Pelvic Tilt  - 2 x daily - 7 x weekly - 2 sets - 10 reps - 5'' hold - Supine Hip Adduction Isometric with Ball  - 1 x daily - 7 x weekly - 2 sets - 10 reps - 10'' hold - Hooklying Isometric Clamshell  - 1 x daily - 7 x weekly - 3 sets - 10 reps  Treatment priorities   Eval        PPT training        Core and hip strengthening        Shoulder girdle and cervical strengthening        Balance L>R                  ASSESSMENT:  CLINICAL IMPRESSION: Burnell is a 63 y.o. male who presents to clinic with signs and sxs consistent with neck and low back pain.  Both appear mechanical in nature with no significant sign of radiculopathy on exam.  Pt unable to initiate PPT and shows abdominal doming.  Will work on deep core muscle volitional activation.  Pt will benefit from skilled pt to address relevant deficits and improve safety and comfort with work and daily activity.  OBJECTIVE IMPAIRMENTS: Pain, lumbar ROM, cervical ROM, hip and LE strength, UE strength  ACTIVITY LIMITATIONS: standing, bending, lifting, sitting, working, sleeping  PERSONAL FACTORS: See  medical history and pertinent history   REHAB POTENTIAL: Good  CLINICAL DECISION MAKING: Stable/uncomplicated  EVALUATION COMPLEXITY: Low   GOALS:   SHORT TERM GOALS: Target date: 11/27/2023   Cristal will be >75% HEP compliant to improve carryover between sessions and facilitate independent management of condition  Evaluation: ongoing Goal status: INITIAL   LONG TERM GOALS: Target date: 12/25/2023   Jaelin will improve FOTO score to 49 as a proxy for functional improvement  Evaluation/Baseline: 48 Goal status: INITIAL    2.  Jaymien will self report >/= 50% decrease in pain from evaluation to improve function in daily tasks  Evaluation/Baseline: 9/10 max pain Goal status: INITIAL   3.  Tresean will improve 30'' STS (MCID 2) to >/= 7x (w/ UE?: y) to show improved LE strength and improved transfers   Evaluation/Baseline: 10x  w/ UE? n Goal status: INITIAL   4.  Rathana will be able to stand for >30'' in tandem of stance, to show a significant improvement in balance in order to reduce fall risk   Evaluation/Baseline: R in rear 8'', L in rear 6'' Goal status: INITIAL   5.  Red will improve the following MMTs to >/= 4/5 to show improvement in strength:    Evaluation/Baseline:   LE MMT:  MMT Right (Eval) Left (Eval)  Hip flexion (L2, L3) 3+ 3+  Knee extension (L3) 3+ 3+  Knee flexion 3+ 3+  Hip abduction    Hip extension  Hip external rotation    Hip internal rotation    Hip adduction    Ankle dorsiflexion (L4) c c  Ankle plantarflexion (S1) c c  Ankle inversion    Ankle eversion    Great Toe ext (L5) c c  Grossly     (Blank rows = not tested, score listed is out of 5 possible points.  N = WNL, D = diminished, C = clear for gross weakness with myotome testing, * = concordant pain with testing)  Goal status: INITIAL   PLAN: PT FREQUENCY: 1-2x/week  PT DURATION: 8 weeks  PLANNED INTERVENTIONS:  [id number]- PT Re-evaluation, 97110-Therapeutic exercises, 97530-  Therapeutic activity, [id number]- Neuromuscular re-education, 97535- Self Care, [id number]- Manual therapy, [id number]- Gait training, [id number]- Aquatic Therapy, [phone]- Electrical stimulation (manual), [id number]- Vasopneumatic device, [id number]- Traction (mechanical), [id number]- Ionotophoresis 4mg /ml Dexamethasone , Taping, Dry Needling, Joint manipulation, and Spinal manipulation.   Breylon Sherrow PT, DPT 10/31/2023, 8:54 AM

## 2023-11-05 ENCOUNTER — Other Ambulatory Visit: Payer: Self-pay

## 2023-11-05 DIAGNOSIS — M47816 Spondylosis without myelopathy or radiculopathy, lumbar region: Secondary | ICD-10-CM | POA: Diagnosis not present

## 2023-11-05 DIAGNOSIS — G8929 Other chronic pain: Secondary | ICD-10-CM | POA: Diagnosis not present

## 2023-11-05 DIAGNOSIS — M961 Postlaminectomy syndrome, not elsewhere classified: Secondary | ICD-10-CM | POA: Diagnosis not present

## 2023-11-05 DIAGNOSIS — M5451 Vertebrogenic low back pain: Secondary | ICD-10-CM | POA: Diagnosis not present

## 2023-11-06 ENCOUNTER — Ambulatory Visit: Payer: Self-pay | Admitting: Physical Therapy

## 2023-11-13 ENCOUNTER — Other Ambulatory Visit: Payer: Self-pay

## 2023-11-13 ENCOUNTER — Encounter: Payer: Self-pay | Admitting: Physician Assistant

## 2023-11-14 ENCOUNTER — Encounter: Payer: Self-pay | Admitting: Physical Therapy

## 2023-11-14 ENCOUNTER — Ambulatory Visit: Payer: Medicaid Other | Admitting: Physical Therapy

## 2023-11-14 DIAGNOSIS — M542 Cervicalgia: Secondary | ICD-10-CM

## 2023-11-14 DIAGNOSIS — M5459 Other low back pain: Secondary | ICD-10-CM

## 2023-11-14 DIAGNOSIS — M6281 Muscle weakness (generalized): Secondary | ICD-10-CM | POA: Diagnosis not present

## 2023-11-14 NOTE — Therapy (Signed)
OUTPATIENT PHYSICAL THERAPY TREATMENT NOTE  Patient Name: Jeremy [name] MRN: [id number] DOB:June 03, 1961, 63 y.o., male Today's Date: 11/14/2023   PT End of Session - 11/14/23 1615     Visit Number 2    Number of Visits --   1-2x/week   Date for PT Re-Evaluation 12/26/23    Authorization Type [employer] - FOTO    Authorization Time Period Approved 7 visits 11/04/22-12/05/23    Authorization - Visit Number 1    Authorization - Number of Visits 7    PT Start Time 1615    PT Stop Time 1657    PT Time Calculation (min) 42 min             Past Medical History:  Diagnosis Date   Diabetes mellitus without complication (HCC)    GERD (gastroesophageal reflux disease)    Heart murmur    Hyperlipidemia    Hypertension    IDA (iron deficiency anemia)    Rash of entire body 03/2016   Sleep apnea    Past Surgical History:  Procedure Laterality Date   BACK SURGERY  1993   lumbar   BALLOON DILATION N/A 04/25/2023   Procedure: BALLOON DILATION;  Surgeon: Lanelle Bal, DO;  Location: [facility];  Service: Endoscopy;  Laterality: N/A;   BIOPSY  02/07/2019   Procedure: BIOPSY;  Surgeon: West Bali, MD;  Location: [facility];  Service: Endoscopy;;   BIOPSY  01/30/2022   Procedure: BIOPSY;  Surgeon: Lanelle Bal, DO;  Location: [facility];  Service: Endoscopy;;   BIOPSY  04/25/2023   Procedure: BIOPSY;  Surgeon: Lanelle Bal, DO;  Location: [facility];  Service: Endoscopy;;   CERVICAL SPINE SURGERY  2000   COLONOSCOPY N/A 02/07/2019   Dr. Darrick Penna: External hemorrhoids next colonoscopy in 10 years   ESOPHAGOGASTRODUODENOSCOPY N/A 02/07/2019   Dr. Darrick Penna: Barrett's esophagus without dysplasia chronic inactive gastritis but no H. 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, nonbleeding duodenal diverticulum,   ESOPHAGOGASTRODUODENOSCOPY N/A 01/05/2020   Procedure: ESOPHAGOGASTRODUODENOSCOPY (EGD);  Surgeon:  West Bali, MD;  Location: [facility];  Service: Endoscopy;  Laterality: N/A;  10:30am   ESOPHAGOGASTRODUODENOSCOPY (EGD) WITH PROPOFOL N/A 12/06/2020   Procedure: ESOPHAGOGASTRODUODENOSCOPY (EGD) WITH PROPOFOL;  Surgeon: Lanelle Bal, DO;  Location: [facility];  Service: Endoscopy;  Laterality: N/A;  11:45am   ESOPHAGOGASTRODUODENOSCOPY (EGD) WITH PROPOFOL N/A 12/22/2021   long-segment Barrett's s/p biopsy, gastric bypass with normal sized puch and intact staple line. GJ anastomosis with healthy mucosa. Gastritis.   ESOPHAGOGASTRODUODENOSCOPY (EGD) WITH PROPOFOL N/A 01/30/2022   Procedure: ESOPHAGOGASTRODUODENOSCOPY (EGD) WITH PROPOFOL;  Surgeon: Lanelle Bal, DO;  [facility]: [facility];  Service: Endoscopy;  Laterality: N/A;  11:00am   ESOPHAGOGASTRODUODENOSCOPY (EGD) WITH PROPOFOL N/A 03/11/2023   Procedure: ESOPHAGOGASTRODUODENOSCOPY (EGD) WITH PROPOFOL;  Surgeon: Benancio Deeds, MD;  Location: [facility];  Service: Gastroenterology;  Laterality: N/A;   ESOPHAGOGASTRODUODENOSCOPY (EGD) WITH PROPOFOL N/A 04/25/2023   Procedure: ESOPHAGOGASTRODUODENOSCOPY (EGD) WITH PROPOFOL;  Surgeon: Lanelle Bal, DO;  [facility]: [facility];  Service: Endoscopy;  Laterality: N/A;  10:30 am, asa 3   GASTROJEJUNOSTOMY N/A 01/03/2021   Procedure: GASTROJEJUNOSTOMY;  Surgeon: [name], MD;  Location: [facility];  Service: General;  Laterality: N/A;   HAND SURGERY     SAVORY DILATION N/A 01/05/2020   Procedure: SAVORY DILATION;  Surgeon: West Bali, MD;  Location: [facility];  Service: Endoscopy;  Laterality: N/A;   Patient Active Problem List   Diagnosis Date Noted   SIADH (syndrome of inappropriate ADH production) (HCC) 09/07/2023   Anastomotic stricture of gastrojejunostomy 04/07/2023   PUD (peptic ulcer disease) 04/02/2023   Anemia 04/02/2023   Marginal ulcer 03/12/2023   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   Coffee ground emesis 03/11/2023   NSAID  long-term use 03/11/2023   Anemia, posthemorrhagic, acute 03/11/2023   Upper GI bleeding 03/10/2023   Hypertension associated with type 2 diabetes mellitus (HCC) 01/15/2023   Constipation 10/25/2022   Upper abdominal pain 07/06/2022   Nausea and vomiting 07/06/2022   Hyponatremia 06/23/2021   Low serum vitamin B12 03/23/2021   Vitamin D deficiency 03/23/2021   S/P bypass gastrojejunostomy 01/03/2021   Gastric outlet obstruction    Duodenal stricture    Primary osteoarthritis of both hips 11/15/2020   Aortic atherosclerosis (HCC) 08/30/2020   Coronary artery calcification 08/30/2020   Bilateral carpal tunnel syndrome 06/07/2020   Grief reaction 06/07/2020   Pyloric stenosis in adult 02/05/2020   Congenital hypertrophic pyloric stenosis    Duodenal web    Barrett's esophagus without dysplasia 05/19/2019   OSA (obstructive sleep apnea) 04/30/2019   Iron deficiency anemia 11/25/2018   Phimosis of penis 11/19/2018   Esophageal dysphagia 08/19/2018   Benign prostatic hyperplasia with post-void dribbling 01/31/2018   Absence of bladder continence 01/31/2018   Obesity (BMI 30-39.9) 11/05/2017   GERD (gastroesophageal reflux disease) 06/03/2017   Chronic pain of both knees 06/03/2017   Chronic lower back pain 06/03/2017   Type 2 diabetes mellitus without complication, without long-term current use of insulin (HCC) 04/12/2016   Essential hypertension 04/12/2016    PCP: Marcine Matar, MD  REFERRING PROVIDER: Marcine Matar, MD  THERAPY DIAG:  Other low back pain  Cervicalgia  Muscle weakness  REFERRING DIAG: Cervicalgia [M54.2], Vertebrogenic low back pain [M54.51]   Rationale for Evaluation and Treatment:  Rehabilitation  SUBJECTIVE:  PERTINENT PAST HISTORY:  DM TII, HTN, CAD, Cervical fusion C5-C7       PRECAUTIONS: None  WEIGHT BEARING RESTRICTIONS No  FALLS:  Has patient fallen in last 6 months? No, Number of falls: 0  MOI/History of  condition:  Onset date: Chronic low back and neck pain since 1992  SUBJECTIVE STATEMENT  11/14/2023:  Pt reports that his back is more sore than painful.  He reports 4/10 pain and "11/10" stiffness.  Eval: Jeremy [name] is a 63 y.o. male who presents to clinic with chief complaint of neck and low back pain.  He has a remote history of cervical fusion and a low back surgery which was not a fusion, but pt is unsure of what exactly was done.  He was taking a NSAID since 1992 but had to stop d/t GI issues.  He had a major GI surgery several year ago.  Since stopping the NSAIDs he has had significant discomfort.  He is working with pain management currently.  He feels like he has balance issue.  Denies n/t in UE and LE.  His hands do go to sleep in certain position.  He denies any gross weakness.   Red flags:  denies   Pain:  Are you having pain? Yes Pain location: Neck and low back NPRS scale:  3/10 to 9/10 Aggravating factors: repetitive motion and no motion Relieving factors:  rest and medication  Pain description: aching Stage: Chronic 24 hour pattern: worse at the end of the day  Occupation: Actor: [employer]  Hand Dominance: L  Patient Goals/Specific Activities: reduce pain   OBJECTIVE:   DIAGNOSTIC FINDINGS:  X-ray: 1.  No acute fracture. 2.  Levocurvature of the lumbar spine. 3.  Mild retrolisthesis of L1 on L2 that is exaggerated on extension view. 4.  Moderate-severe multilevel degenerative disc disease. 5.  Severe lower lumbar facet arthropathy.   1.  No displaced fracture or traumatic malalignment. 2.  ACDF hardware at levels C5-C7 with osseous fusion. 3.  Moderate degenerative changes at the atlantoaxial joint. 4.  Moderate degenerative disc disease at C4-C5. Mild at remaining noninstrumented levels.     GENERAL OBSERVATION/GAIT: Abdominal doming  SENSATION: Light touch: Appears intact  Cervical ROM  ROM ROM  (Eval)  Flexion 40   Extension 40*  Right lateral flexion 30  Left lateral flexion 20  Right rotation 50  Left rotation 45  Flexion rotation (normal is 30 degrees)   Flexion rotation (normal is 30 degrees)     (Blank rows = not tested, N = WNL, * = concordant pain)   LUMBAR AROM  AROM AROM  (Eval)  Flexion Fingertips to mid shin, w/ concordant pain  Extension limited by 50%, w/ concordant pain  Right lateral flexion limited by 50%  Left lateral flexion limited by 50%  Right rotation limited by 50%  Left rotation limited by 50%    (Blank rows = not tested)   LE MMT:  MMT Right (Eval) Left (Eval)  Hip flexion (L2, L3) 3+ 3+  Knee extension (L3) 3+ 3+  Knee flexion 3+ 3+  Hip abduction    Hip extension    Hip external rotation    Hip internal rotation    Hip adduction    Ankle dorsiflexion (L4) c c  Ankle plantarflexion (S1) c c  Ankle inversion    Ankle eversion    Great Toe ext (L5) c c  Grossly     (Blank rows = not tested, score listed is out of 5 possible points.  N = WNL, D = diminished, C = clear for gross weakness with myotome testing, * = concordant pain with testing)  UPPER EXTREMITY MMT:  MMT Right (Eval) Left (Eval)  Shoulder flexion 3+* 4  Shoulder abduction (C5) c c  Shoulder ER    Shoulder IR    Middle trapezius    Lower trapezius    Shoulder extension    Grip strength    Shoulder shrug (C4) c c  Elbow flexion (C6) c c  Elbow ext (C7) c c  Thumb ext (C8) c c  Finger abd (T1) c c  Grossly     (Blank rows = not tested, score listed is out of 5 possible points.  N = WNL, D = diminished, C = clear for gross weakness with myotome testing, * = concordant pain with testing)   SPECIAL TESTS:  Straight leg raise: L (-), R (-) Slump: L (~), R (-)  MUSCLE LENGTH: Hamstrings: Right subtle restriction; Left subtle restriction  LE ROM:  ROM Right (Eval) Left (Eval)  Hip flexion    Hip extension    Hip abduction    Hip adduction    Hip internal rotation     Hip external rotation    Knee flexion    Knee extension    Ankle dorsiflexion    Ankle plantarflexion    Ankle inversion    Ankle eversion      (Blank rows = not tested, N =  WNL, * = concordant pain with testing)  Functional Tests  Eval    30'' STS: 7x  UE used? Y    Progressive balance screen (highest level completed for >/= 10''):   Tandem: R in rear 8'', L in rear 6''                                                       PATIENT SURVEYS:  FOTO 48 -> 49   TODAY'S TREATMENT   [facility] Adult PT Treatment  11/14/2023:  Therapeutic Exercise: LTR HS kick 20x with 1' hold last rep Alternating clam - black TB - 2x10 ea Hip adduction pilates squeeze - x10 w/ 5'' hold Supine 90-90 leg lift - 2x10 Bridge - 2x10 PPT training with gait belt under low back Alternating supine march with Black TB - 2x10    HOME EXERCISE PROGRAM: Access Code: [email or web address] URL: [email or web address] Date: 10/30/2023 Prepared by: Alphonzo Severance  Exercises - Supine Lower Trunk Rotation  - 1 x daily - 7 x weekly - 1 sets - 20 reps - 3 hold - Supine Posterior Pelvic Tilt  - 2 x daily - 7 x weekly - 2 sets - 10 reps - 5-10 second hold hold - Supine Posterior Pelvic Tilt  - 2 x daily - 7 x weekly - 2 sets - 10 reps - 5'' hold - Supine Hip Adduction Isometric with Ball  - 1 x daily - 7 x weekly - 2 sets - 10 reps - 10'' hold - Hooklying Isometric Clamshell  - 1 x daily - 7 x weekly - 3 sets - 10 reps  Treatment priorities   Eval        PPT training        Core and hip strengthening        Shoulder girdle and cervical strengthening        Balance L>R                  ASSESSMENT:  CLINICAL IMPRESSION:  11/14/2023:  Jeremy [name] tolerated session well with no adverse reaction.  Initiated basic core and hip mat strengthening program to good effect.  Discussed need to start regular gentle exercise to see progress; pt confirms understanding.   EVAL: Jeremy [name] is a 63 y.o. male who  presents to clinic with signs and sxs consistent with neck and low back pain.  Both appear mechanical in nature with no significant sign of radiculopathy on exam.  Pt unable to initiate PPT and shows abdominal doming.  Will work on deep core muscle volitional activation.  Pt will benefit from skilled pt to address relevant deficits and improve safety and comfort with work and daily activity.  OBJECTIVE IMPAIRMENTS: Pain, lumbar ROM, cervical ROM, hip and LE strength, UE strength  ACTIVITY LIMITATIONS: standing, bending, lifting, sitting, working, sleeping  PERSONAL FACTORS: See medical history and pertinent history   REHAB POTENTIAL: Good  CLINICAL DECISION MAKING: Stable/uncomplicated  EVALUATION COMPLEXITY: Low   GOALS:   SHORT TERM GOALS: Target date: 11/27/2023   Jeremy [name] will be >75% HEP compliant to improve carryover between sessions and facilitate independent management of condition  Evaluation: ongoing Goal status: INITIAL   LONG TERM GOALS: Target date: 12/25/2023   Jeremy [name] will improve FOTO score to 49 as a proxy for functional improvement  Evaluation/Baseline: 48 Goal status:  INITIAL    2.  Jeremy [name] will self report >/= 50% decrease in pain from evaluation to improve function in daily tasks  Evaluation/Baseline: 9/10 max pain Goal status: INITIAL   3.  Jeremy [name] will improve 30'' STS (MCID 2) to >/= 7x (w/ UE?: y) to show improved LE strength and improved transfers   Evaluation/Baseline: 10x  w/ UE? n Goal status: INITIAL   4.  Jeremy [name] will be able to stand for >30'' in tandem of stance, to show a significant improvement in balance in order to reduce fall risk   Evaluation/Baseline: R in rear 8'', L in rear 6'' Goal status: INITIAL   5.  Jeremy [name] will improve the following MMTs to >/= 4/5 to show improvement in strength:    Evaluation/Baseline:   LE MMT:  MMT Right (Eval) Left (Eval)  Hip flexion (L2, L3) 3+ 3+  Knee extension (L3) 3+ 3+  Knee flexion 3+ 3+  Hip  abduction    Hip extension    Hip external rotation    Hip internal rotation    Hip adduction    Ankle dorsiflexion (L4) c c  Ankle plantarflexion (S1) c c  Ankle inversion    Ankle eversion    Great Toe ext (L5) c c  Grossly     (Blank rows = not tested, score listed is out of 5 possible points.  N = WNL, D = diminished, C = clear for gross weakness with myotome testing, * = concordant pain with testing)  Goal status: INITIAL   PLAN: PT FREQUENCY: 1-2x/week  PT DURATION: 8 weeks  PLANNED INTERVENTIONS:  97164- PT Re-evaluation, 97110-Therapeutic exercises, 97530- Therapeutic activity, [id number]- Neuromuscular re-education, 97535- Self Care, 16109- Manual therapy, [id number]- Gait training, [id number]- Aquatic Therapy, [phone]- Electrical stimulation (manual), [id number]- Vasopneumatic device, [id number]- Traction (mechanical), [id number]- Ionotophoresis 4mg /ml Dexamethasone, Taping, Dry Needling, Joint manipulation, and Spinal manipulation.   Alphonzo Severance PT, DPT 11/14/2023, 5:00 PM

## 2023-11-21 ENCOUNTER — Ambulatory Visit: Payer: Medicaid Other | Admitting: Physical Therapy

## 2023-11-22 ENCOUNTER — Other Ambulatory Visit: Payer: Self-pay

## 2023-11-28 ENCOUNTER — Ambulatory Visit: Payer: Self-pay | Admitting: Physical Therapy

## 2023-11-30 ENCOUNTER — Other Ambulatory Visit: Payer: Self-pay

## 2023-12-06 ENCOUNTER — Inpatient Hospital Stay: Payer: Medicaid Other

## 2023-12-06 ENCOUNTER — Telehealth: Payer: Self-pay | Admitting: Internal Medicine

## 2023-12-06 ENCOUNTER — Inpatient Hospital Stay: Payer: Medicaid Other | Admitting: Internal Medicine

## 2023-12-11 ENCOUNTER — Inpatient Hospital Stay: Payer: Medicaid Other | Attending: Internal Medicine

## 2023-12-11 ENCOUNTER — Inpatient Hospital Stay: Payer: Medicaid Other | Admitting: Internal Medicine

## 2023-12-11 ENCOUNTER — Other Ambulatory Visit: Payer: Self-pay | Admitting: Internal Medicine

## 2023-12-11 VITALS — BP 134/62 | HR 66 | Temp 97.9°F | Resp 17 | Ht 70.0 in | Wt 204.7 lb

## 2023-12-11 DIAGNOSIS — G8929 Other chronic pain: Secondary | ICD-10-CM | POA: Diagnosis not present

## 2023-12-11 DIAGNOSIS — K909 Intestinal malabsorption, unspecified: Secondary | ICD-10-CM | POA: Diagnosis not present

## 2023-12-11 DIAGNOSIS — D509 Iron deficiency anemia, unspecified: Secondary | ICD-10-CM | POA: Diagnosis not present

## 2023-12-11 DIAGNOSIS — Z79891 Long term (current) use of opiate analgesic: Secondary | ICD-10-CM | POA: Diagnosis not present

## 2023-12-11 DIAGNOSIS — M47816 Spondylosis without myelopathy or radiculopathy, lumbar region: Secondary | ICD-10-CM | POA: Diagnosis not present

## 2023-12-11 DIAGNOSIS — Z9884 Bariatric surgery status: Secondary | ICD-10-CM | POA: Insufficient documentation

## 2023-12-11 DIAGNOSIS — D508 Other iron deficiency anemias: Secondary | ICD-10-CM

## 2023-12-11 DIAGNOSIS — M5451 Vertebrogenic low back pain: Secondary | ICD-10-CM | POA: Diagnosis not present

## 2023-12-11 DIAGNOSIS — M961 Postlaminectomy syndrome, not elsewhere classified: Secondary | ICD-10-CM | POA: Diagnosis not present

## 2023-12-11 LAB — CBC WITH DIFFERENTIAL (CANCER CENTER ONLY)
Abs Immature Granulocytes: 0.08 10*3/uL — ABNORMAL HIGH (ref 0.00–0.07)
Basophils Absolute: 0.1 10*3/uL (ref 0.0–0.1)
Basophils Relative: 2 %
Eosinophils Absolute: 0.5 10*3/uL (ref 0.0–0.5)
Eosinophils Relative: 10 %
HCT: 40.5 % (ref 39.0–52.0)
Hemoglobin: 13.3 g/dL (ref 13.0–17.0)
Immature Granulocytes: 2 %
Lymphocytes Relative: 23 %
Lymphs Abs: 1.2 10*3/uL (ref 0.7–4.0)
MCH: 31.3 pg (ref 26.0–34.0)
MCHC: 32.8 g/dL (ref 30.0–36.0)
MCV: 95.3 fL (ref 80.0–100.0)
Monocytes Absolute: 0.5 10*3/uL (ref 0.1–1.0)
Monocytes Relative: 10 %
Neutro Abs: 2.7 10*3/uL (ref 1.7–7.7)
Neutrophils Relative %: 53 %
Platelet Count: 206 10*3/uL (ref 150–400)
RBC: 4.25 MIL/uL (ref 4.22–5.81)
RDW: 12.9 % (ref 11.5–15.5)
WBC Count: 5.1 10*3/uL (ref 4.0–10.5)
nRBC: 0 % (ref 0.0–0.2)

## 2023-12-11 LAB — IRON AND IRON BINDING CAPACITY (CC-WL,HP ONLY)
Iron: 139 ug/dL (ref 45–182)
Saturation Ratios: 39 % (ref 17.9–39.5)
TIBC: 358 ug/dL (ref 250–450)
UIBC: 219 ug/dL (ref 117–376)

## 2023-12-11 LAB — FERRITIN: Ferritin: 24 ng/mL (ref 24–336)

## 2023-12-11 NOTE — Progress Notes (Signed)
 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  PRIOR THERAPY: Iron infusion with Venofer 300 mg IV weekly for 3 weeks.  CURRENT THERAPY: Over-the-counter oral iron tablet with vitamin C twice daily.  INTERVAL HISTORY: DMITRI PETTIGREW 63 y.o. male return to the clinic today for follow-up visit. Discussed the use of AI scribe software for clinical note transcription with the patient, who gave verbal consent to proceed.  History of Present Illness   TAYSHAUN KROH is a 63 year old male with iron deficiency anemia secondary to malabsorption who presents for follow-up.  He has a history of iron deficiency anemia secondary to malabsorption following gastric bypass surgery in March 2022. He receives iron infusions with Venofer as needed, based on lab results, and takes oral iron tablets with vitamin C twice daily.  He feels 'pretty good' overall but experiences fatigue, particularly feeling tired halfway through the day. He notes that his sleep could be better. No new complaints, bleeding, or bruising. He has not experienced any dizziness recently. He reports some congestion over the last couple of weeks, which he notes is common during this time.  Recent lab results show a hemoglobin level of 13.3 and a hematocrit of 40.5, both within normal ranges. His iron level is 139, and iron saturation is 39%, which are also normal. He was last seen approximately three months ago.       MEDICAL HISTORY: Past Medical History:  Diagnosis Date   Diabetes mellitus without complication (HCC)    GERD (gastroesophageal reflux disease)    Heart murmur    Hyperlipidemia    Hypertension    IDA (iron deficiency anemia)    Rash of entire body 03/2016   Sleep apnea      ALLERGIES:  is allergic to cymbalta [duloxetine hcl], doxycycline, other, and robaxin [methocarbamol].  MEDICATIONS:  Current Outpatient Medications  Medication Sig Dispense Refill   Accu-Chek Softclix Lancets lancets Use to check blood sugar three times daily. 100 each 2   acetaminophen (TYLENOL) 500 MG tablet Take 1,000 mg by mouth in the morning and at bedtime.      amLODipine (NORVASC) 5 MG tablet Take 1 tablet (5 mg total) by mouth daily. 90 tablet 1   ascorbic acid (VITAMIN C) 500 MG tablet Take 500 mg by mouth 2 (two) times daily.     Blood Glucose Monitoring Suppl (ACCU-CHEK GUIDE) w/Device KIT Use to check blood sugar three times daily. 1 kit 0   Cyanocobalamin (VITAMIN B-12 PO) Take 2,500 mcg by mouth daily.     cyclobenzaprine (FLEXERIL) 10 MG tablet Take 1 tablet (10 mg total) by mouth 2 (two) times daily as needed for muscle spasms. 180 tablet 1   ferrous sulfate (FEROSUL) 325 (65 FE) MG tablet Take 1 tablet (325 mg total) by mouth 2 (two) times daily with a meal. 180 tablet 1   fluticasone (FLONASE) 50 MCG/ACT nasal spray Place 1 spray into both nostrils daily as needed for allergies or rhinitis. (Patient not taking: Reported on 09/07/2023) 16 g 2   glipiZIDE (GLUCOTROL) 5 MG tablet Take 0.5 tablets (2.5 mg total) by mouth 2 (two) times daily before a meal. 90 tablet 1   lisinopril (ZESTRIL) 20 MG tablet Take 1 tablet (20 mg total) by mouth  in the morning and at bedtime. 180 tablet 1   loratadine (CLARITIN) 10 MG tablet Take 10 mg by mouth daily.     Magnesium Oxide 200 MG TABS Take 200 mg by mouth in the morning and at bedtime.     Multiple Vitamins-Minerals (MULTIVITAMIN WITH MINERALS) tablet Take 1 tablet by mouth 2 (two) times daily.     ondansetron (ZOFRAN) 8 MG tablet Take 1 tablet (8 mg total) by mouth daily as needed for nausea or vomiting. 30 tablet 2   pantoprazole (PROTONIX) 40 MG tablet Take 1 tablet (40 mg total) by mouth 2 (two) times daily before a meal. 60  tablet 5   Potassium 99 MG TABS Take 198 mg by mouth 2 (two) times daily.     rOPINIRole (REQUIP) 1 MG tablet Take 1 tablet (1 mg total) by mouth at bedtime. 90 tablet 1   sildenafil (VIAGRA) 50 MG tablet Take 1 tablet (50 mg total) by mouth 1/2 hr before intercourse.  Limit use to 1 tab/24 hr 10 tablet 3   traMADol (ULTRAM) 50 MG tablet Take 2 tablets (100 mg total) by mouth every 12 (twelve) hours as needed (to fill on or after 05/20/2023). 120 tablet 2   No current facility-administered medications for this visit.    SURGICAL HISTORY:  Past Surgical History:  Procedure Laterality Date   BACK SURGERY  1993   lumbar   BALLOON DILATION N/A 04/25/2023   Procedure: BALLOON DILATION;  Surgeon: Lanelle Bal, DO;  [facility];  Service: Endoscopy;  Laterality: N/A;   BIOPSY  02/07/2019   Procedure: BIOPSY;  Surgeon: West Bali, MD;  [facility];  Service: Endoscopy;;   BIOPSY  01/30/2022   Procedure: BIOPSY;  Surgeon: Lanelle Bal, DO;  [facility];  Service: Endoscopy;;   BIOPSY  04/25/2023   Procedure: BIOPSY;  Surgeon: Lanelle Bal, DO;  [facility];  Service: Endoscopy;;   CERVICAL SPINE SURGERY  2000   COLONOSCOPY N/A 02/07/2019   Dr. Darrick Penna: External hemorrhoids next colonoscopy in 10 years   ESOPHAGOGASTRODUODENOSCOPY N/A 02/07/2019   Dr. Darrick Penna: Barrett's esophagus without dysplasia chronic inactive gastritis but no H. 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, nonbleeding duodenal diverticulum,   ESOPHAGOGASTRODUODENOSCOPY N/A 01/05/2020   Procedure: ESOPHAGOGASTRODUODENOSCOPY (EGD);  Surgeon: West Bali, MD;  [facility];  Service: Endoscopy;  Laterality: N/A;  10:30am   ESOPHAGOGASTRODUODENOSCOPY (EGD) WITH PROPOFOL N/A 12/06/2020   Procedure: ESOPHAGOGASTRODUODENOSCOPY (EGD) WITH PROPOFOL;  Surgeon: Lanelle Bal, DO;  [facility];   Service: Endoscopy;  Laterality: N/A;  11:45am   ESOPHAGOGASTRODUODENOSCOPY (EGD) WITH PROPOFOL N/A 12/22/2021   long-segment Barrett's s/p biopsy, gastric bypass with normal sized puch and intact staple line. GJ anastomosis with healthy mucosa. Gastritis.   ESOPHAGOGASTRODUODENOSCOPY (EGD) WITH PROPOFOL N/A 01/30/2022   Procedure: ESOPHAGOGASTRODUODENOSCOPY (EGD) WITH PROPOFOL;  Surgeon: Lanelle Bal, DO;  [facility];  Service: Endoscopy;  Laterality: N/A;  11:00am   ESOPHAGOGASTRODUODENOSCOPY (EGD) WITH PROPOFOL N/A 03/11/2023   Procedure: ESOPHAGOGASTRODUODENOSCOPY (EGD) WITH PROPOFOL;  Surgeon: Benancio Deeds, MD;  Location: [facility];  Service: Gastroenterology;  Laterality: N/A;   ESOPHAGOGASTRODUODENOSCOPY (EGD) WITH PROPOFOL N/A 04/25/2023   Procedure: ESOPHAGOGASTRODUODENOSCOPY (EGD) WITH PROPOFOL;  Surgeon: Lanelle Bal, DO;  [facility];  Service: Endoscopy;  Laterality: N/A;  10:30 am, asa 3   GASTROJEJUNOSTOMY N/A 01/03/2021  Procedure: GASTROJEJUNOSTOMY;  Surgeon: [name], MD;  Location: [facility];  Service: General;  Laterality: N/A;   HAND SURGERY     SAVORY DILATION N/A 01/05/2020   Procedure: SAVORY DILATION;  Surgeon: West Bali, MD;  [facility];  Service: Endoscopy;  Laterality: N/A;    REVIEW OF SYSTEMS:  A comprehensive review of systems was negative.   PHYSICAL EXAMINATION: General appearance: alert, cooperative, and no distress Head: Normocephalic, without obvious abnormality, atraumatic Neck: no adenopathy, no JVD, supple, symmetrical, trachea midline, and thyroid not enlarged, symmetric, no tenderness/mass/nodules Lymph nodes: Cervical, supraclavicular, and axillary nodes normal. Resp: clear to auscultation bilaterally Back: symmetric, no curvature. ROM normal. No CVA tenderness. Cardio: regular rate and rhythm, S1, S2 normal, no murmur, click, rub or gallop GI: soft, non-tender; bowel sounds normal;  no masses,  no organomegaly Extremities: extremities normal, atraumatic, no cyanosis or edema  ECOG PERFORMANCE STATUS: 1 - Symptomatic but completely ambulatory  Blood pressure 134/62, pulse 66, temperature 97.9 F (36.6 C), temperature source Temporal, resp. rate 17, height 5\' 10"  (1.778 m), weight 204 lb 11.2 oz (92.9 kg), SpO2 100%.  LABORATORY DATA: Lab Results  Component Value Date   WBC 5.1 12/11/2023   HGB 13.3 12/11/2023   HCT 40.5 12/11/2023   MCV 95.3 12/11/2023   PLT 206 12/11/2023      Chemistry      Component Value Date/Time   NA 140 09/07/2023 1220   K 4.3 09/07/2023 1220   CL 102 09/07/2023 1220   CO2 23 09/07/2023 1220   BUN 15 09/07/2023 1220   CREATININE 0.78 09/07/2023 1220   CREATININE 0.65 12/14/2022 0832   CREATININE 0.72 10/17/2016 1012      Component Value Date/Time   CALCIUM 9.6 09/07/2023 1220   ALKPHOS 102 09/07/2023 1220   AST 40 09/07/2023 1220   AST 24 12/14/2022 0832   ALT 56 (H) 09/07/2023 1220   ALT 21 12/14/2022 0832   BILITOT <0.2 09/07/2023 1220   BILITOT 0.2 (L) 12/14/2022 0832       RADIOGRAPHIC STUDIES: No results found.  ASSESSMENT AND PLAN: This is a very pleasant 64 years old white male with iron deficiency anemia secondary to malabsorption secondary to gastrojejunostomy in March 2022. The patient was treated with iron infusion with Venofer 300 mg IV for 3 doses.  Last dose was given in March 2024.  He is currently on oral iron tablet with ferrous sulfate twice daily with orange juice.    Iron Deficiency Anemia Secondary to Malabsorption Post-Gastric Bypass Surgery Iron deficiency anemia secondary to malabsorption post-gastric bypass surgery in March 2022. Managed with iron infusions (Venofer) and oral iron supplements with vitamin C. Current labs: hemoglobin 13.3, hematocrit 40.5, iron 139, iron saturation 39%, all within normal ranges. Reports general well-being with some fatigue, no new bleeding or bruising. No infusion  needed currently. Emphasized the importance of continuing oral iron supplements and monitoring for fatigue or other symptoms. Discussed potential need for earlier intervention if levels drop or symptoms worsen. - Continue oral iron supplements with vitamin C twice daily - Repeat CBC, iron study, and ferritin in three months - Follow-up in three months - Advise to call if significant fatigue or other concerning symptoms develop  General Health Maintenance Reports some congestion over the past few weeks, a common issue currently. No other general health concerns discussed. - Monitor for new symptoms.   The patient was advised to call immediately if he has any concerning symptoms in  the interval. All questions were answered. The patient knows to call the clinic with any problems, questions or concerns. We can certainly see the patient much sooner if necessary. The total time spent in the appointment was 20 minutes.  Disclaimer: This note was dictated with voice recognition software. Similar sounding words can inadvertently be transcribed and may not be corrected upon review.

## 2023-12-12 ENCOUNTER — Other Ambulatory Visit: Payer: Self-pay

## 2023-12-12 MED ORDER — TRAMADOL HCL 50 MG PO TABS
100.0000 mg | ORAL_TABLET | Freq: Two times a day (BID) | ORAL | 2 refills | Status: DC | PRN
  Filled 2023-12-12: qty 120, 30d supply, fill #0
  Filled 2024-01-08 – 2024-01-10 (×2): qty 120, 30d supply, fill #1
  Filled 2024-03-04: qty 120, 30d supply, fill #2

## 2023-12-12 NOTE — Telephone Encounter (Signed)
 Requested medication (s) are due for refill today: Yes  Requested medication (s) are on the active medication list: Yes  Last refill:  08/21/23  Future visit scheduled: Yes  Notes to clinic:  Unable to refill per protocol, cannot delegate.      Requested Prescriptions  Pending Prescriptions Disp Refills   traMADol (ULTRAM) 50 MG tablet 120 tablet 2    Sig: Take 2 tablets (100 mg total) by mouth every 12 (twelve) hours as needed (to fill on or after 05/20/2023).     Not Delegated - Analgesics:  Opioid Agonists Failed - 12/12/2023 12:59 PM      Failed - This refill cannot be delegated      Failed - Urine Drug Screen completed in last 360 days      Failed - Valid encounter within last 3 months    Recent Outpatient Visits           3 months ago Type 2 diabetes mellitus with other circulatory complication, without long-term current use of insulin (HCC)   [facility] Of [place]. [facility] Marcine Matar, MD   7 months ago Chronic left shoulder pain   [facility] - A Dept Of [place]. [facility] Jonah Blue B, MD   11 months ago Hypertension associated with type 2 diabetes mellitus [facility])   [facility] - A Dept Of [place]. [facility] Marcine Matar, MD   1 year ago Type 2 diabetes mellitus with obesity [facility])   [facility] [facility] - A Dept Of [place]. [facility] Marcine Matar, MD   1 year ago Right elbow pain   [facility] [facility] - A Dept Of [place]. [facility] Claiborne Rigg, NP       Future Appointments             In 1 month Laural Benes Binnie Rail, MD [facility] [place] - A Dept Of [place]. [facility]

## 2023-12-13 ENCOUNTER — Other Ambulatory Visit: Payer: Self-pay

## 2023-12-14 ENCOUNTER — Other Ambulatory Visit: Payer: Self-pay

## 2023-12-24 ENCOUNTER — Encounter: Payer: Self-pay | Admitting: Physician Assistant

## 2023-12-24 ENCOUNTER — Other Ambulatory Visit: Payer: Self-pay

## 2023-12-27 ENCOUNTER — Encounter: Payer: Self-pay | Admitting: Internal Medicine

## 2024-01-03 DIAGNOSIS — M47816 Spondylosis without myelopathy or radiculopathy, lumbar region: Secondary | ICD-10-CM | POA: Diagnosis not present

## 2024-01-08 ENCOUNTER — Other Ambulatory Visit: Payer: Self-pay

## 2024-01-08 ENCOUNTER — Other Ambulatory Visit: Payer: Self-pay | Admitting: Internal Medicine

## 2024-01-08 DIAGNOSIS — E1159 Type 2 diabetes mellitus with other circulatory complications: Secondary | ICD-10-CM

## 2024-01-08 DIAGNOSIS — D509 Iron deficiency anemia, unspecified: Secondary | ICD-10-CM

## 2024-01-09 ENCOUNTER — Other Ambulatory Visit: Payer: Self-pay

## 2024-01-10 ENCOUNTER — Other Ambulatory Visit: Payer: Self-pay

## 2024-01-11 ENCOUNTER — Other Ambulatory Visit: Payer: Self-pay

## 2024-01-11 ENCOUNTER — Encounter: Payer: Self-pay | Admitting: Internal Medicine

## 2024-01-11 ENCOUNTER — Ambulatory Visit: Payer: Medicaid Other | Attending: Internal Medicine | Admitting: Internal Medicine

## 2024-01-11 VITALS — BP 128/63 | HR 68 | Temp 97.9°F | Ht 70.0 in | Wt 206.0 lb

## 2024-01-11 DIAGNOSIS — T466X5A Adverse effect of antihyperlipidemic and antiarteriosclerotic drugs, initial encounter: Secondary | ICD-10-CM

## 2024-01-11 DIAGNOSIS — I10 Essential (primary) hypertension: Secondary | ICD-10-CM | POA: Diagnosis not present

## 2024-01-11 DIAGNOSIS — M79672 Pain in left foot: Secondary | ICD-10-CM

## 2024-01-11 DIAGNOSIS — I152 Hypertension secondary to endocrine disorders: Secondary | ICD-10-CM

## 2024-01-11 DIAGNOSIS — Z7984 Long term (current) use of oral hypoglycemic drugs: Secondary | ICD-10-CM | POA: Diagnosis not present

## 2024-01-11 DIAGNOSIS — M79641 Pain in right hand: Secondary | ICD-10-CM | POA: Diagnosis not present

## 2024-01-11 DIAGNOSIS — M79642 Pain in left hand: Secondary | ICD-10-CM | POA: Diagnosis not present

## 2024-01-11 DIAGNOSIS — D509 Iron deficiency anemia, unspecified: Secondary | ICD-10-CM

## 2024-01-11 DIAGNOSIS — G720 Drug-induced myopathy: Secondary | ICD-10-CM

## 2024-01-11 DIAGNOSIS — E119 Type 2 diabetes mellitus without complications: Secondary | ICD-10-CM | POA: Diagnosis not present

## 2024-01-11 LAB — POCT GLYCOSYLATED HEMOGLOBIN (HGB A1C): HbA1c, POC (controlled diabetic range): 5.8 % (ref 0.0–7.0)

## 2024-01-11 LAB — GLUCOSE, POCT (MANUAL RESULT ENTRY)
POC Glucose: 70 mg/dL (ref 70–99)
POC Glucose: 108 mg/dL — AB (ref 70–99)

## 2024-01-11 MED ORDER — GLIPIZIDE ER 2.5 MG PO TB24
2.5000 mg | ORAL_TABLET | Freq: Every day | ORAL | 1 refills | Status: DC
  Filled 2024-01-11: qty 90, 90d supply, fill #0
  Filled 2024-03-04 – 2024-04-08 (×2): qty 90, 90d supply, fill #1

## 2024-01-11 NOTE — Patient Instructions (Signed)
 This the glipizide to 2.5 mg once a day extended release tablet.  Check your blood sugars daily for the next 2 to 3 weeks to make sure that they are staying within range.   I have referred you to the podiatrist for the left foot pain.  I think the pain in the hands may be due to arthritis.

## 2024-01-11 NOTE — Progress Notes (Signed)
 Patient ID: Jeremy [name], male    DOB: 11/19/1960  MRN: [id number]  CC: Diabetes (Dm f/u. /No questions / concerns/Already received flu vax)   Subjective: Pawel [name] is a 63 y.o. male who presents for chronic ds management. His concerns today include:  Pt with hx of DM, HL, HTN,GERD, IDA (Barrett's esophagus and moderate gastritis thought to be NSAID induced on EGD; iron infusions by Dr. 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  Myoview neg 02/2021   DM: Results for orders placed or performed in visit on 01/11/24  POCT glucose (manual entry)   Collection Time: 01/11/24 10:18 AM  Result Value Ref Range   POC Glucose 70 70 - 99 mg/dl  POCT glycosylated hemoglobin (Hb A1C)   Collection Time: 01/11/24 10:25 AM  Result Value Ref Range   Hemoglobin A1C     HbA1c POC (<> result, manual entry)     HbA1c, POC (prediabetic range)     HbA1c, POC (controlled diabetic range) 5.8 0.0 - 7.0 %  POCT glucose (manual entry)   Collection Time: 01/11/24  1:34 PM  Result Value Ref Range   POC Glucose 108 (A) 70 - 99 mg/dl   *Note: Due to a large number of results and/or encounters for the requested time period, some results have not been displayed. A complete set of results can be found in Results Review.  Taking Glucotrol 2.5 mg BID Has not eaten as yet this a.m and BS a little low at 70 here in the office.  Give small pack of chocolate chip cookies. Not checking BS regularly Eating more and sometimes eating the stuff that he should not be eating.  Anticipates that he will be eating less over next several wks because he will be having dental work done next week.  All of the teeth in the upper jaw will be removed and mostly in the lower jaw.  He will get partials and a denture.   HTN: Taking Norvasc 5 mg and lisinopril 20 mg twice a day. Not checking  BP. Last lipid profile revealed LDL of 48.  We have kept him off statins due to joint pains when he was on statin.  Wakes up with hands hurting and tight.  Usually sleeps with arms cross his chest.  No swelling in hands. Has to hand hangs on side of bed for relief.  IDA: Saw Dr. Arbutus Ped recently.  CBC revealed normalization of hemoglobin to 13.3.  Ferritin level was 35.  Advised to continue taking iron twice a day.     Complains of feeling discomfort on the ball of the left foot x 1 month intermittently.  Feels like he has a pebble in his shoe.  When he rubs over the area, it is sore.  No numbness.  Saw pain specialist for chronic lower back pain.  Had inj to back through [facility] last wk. he found it helpful.  Specialist wanted him to do physical therapy once a week but he was too sore after therapy sessions so he discontinued.   Patient Active Problem List   Diagnosis Date Noted   SIADH (syndrome of inappropriate ADH production) (HCC) 09/07/2023   Anastomotic stricture of gastrojejunostomy 04/07/2023   PUD (peptic ulcer disease) 04/02/2023   Anemia 04/02/2023   Marginal ulcer 03/12/2023   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   Coffee ground emesis  03/11/2023   NSAID long-term use 03/11/2023   Anemia, posthemorrhagic, acute 03/11/2023   Upper GI bleeding 03/10/2023   Hypertension associated with type 2 diabetes mellitus (HCC) 01/15/2023   Constipation 10/25/2022   Upper abdominal pain 07/06/2022   Nausea and vomiting 07/06/2022   Hyponatremia 06/23/2021   Low serum vitamin B12 03/23/2021   Vitamin D deficiency 03/23/2021   S/P bypass gastrojejunostomy 01/03/2021   Gastric outlet obstruction    Duodenal stricture    Primary osteoarthritis of both hips 11/15/2020   Aortic atherosclerosis (HCC) 08/30/2020   Coronary artery calcification 08/30/2020   Bilateral carpal tunnel syndrome 06/07/2020   Grief reaction 06/07/2020   Pyloric stenosis in adult 02/05/2020   Congenital  hypertrophic pyloric stenosis    Duodenal web    Barrett's esophagus without dysplasia 05/19/2019   OSA (obstructive sleep apnea) 04/30/2019   Iron deficiency anemia 11/25/2018   Phimosis of penis 11/19/2018   Esophageal dysphagia 08/19/2018   Benign prostatic hyperplasia with post-void dribbling 01/31/2018   Absence of bladder continence 01/31/2018   Obesity (BMI 30-39.9) 11/05/2017   GERD (gastroesophageal reflux disease) 06/03/2017   Chronic pain of both knees 06/03/2017   Chronic lower back pain 06/03/2017   Type 2 diabetes mellitus without complication, without long-term current use of insulin (HCC) 04/12/2016   Essential hypertension 04/12/2016     Current Outpatient Medications on File Prior to Visit  Medication Sig Dispense Refill   Accu-Chek Softclix Lancets lancets Use to check blood sugar three times daily. 100 each 2   acetaminophen (TYLENOL) 500 MG tablet Take 1,000 mg by mouth in the morning and at bedtime.      amLODipine (NORVASC) 5 MG tablet Take 1 tablet (5 mg total) by mouth daily. 90 tablet 1   ascorbic acid (VITAMIN C) 500 MG tablet Take 500 mg by mouth 2 (two) times daily.     Blood Glucose Monitoring Suppl (ACCU-CHEK GUIDE) w/Device KIT Use to check blood sugar three times daily. 1 kit 0   Cyanocobalamin (VITAMIN B-12 PO) Take 2,500 mcg by mouth daily.     cyclobenzaprine (FLEXERIL) 10 MG tablet Take 1 tablet (10 mg total) by mouth 2 (two) times daily as needed for muscle spasms. 180 tablet 1   ferrous sulfate (FEROSUL) 325 (65 FE) MG tablet Take 1 tablet (325 mg total) by mouth 2 (two) times daily with a meal. 180 tablet 1   fluticasone (FLONASE) 50 MCG/ACT nasal spray Place 1 spray into both nostrils daily as needed for allergies or rhinitis. 16 g 2   lisinopril (ZESTRIL) 20 MG tablet Take 1 tablet (20 mg total) by mouth in the morning and at bedtime. 180 tablet 1   loratadine (CLARITIN) 10 MG tablet Take 10 mg by mouth daily.     Magnesium Oxide 200 MG TABS  Take 200 mg by mouth in the morning and at bedtime.     Multiple Vitamins-Minerals (MULTIVITAMIN WITH MINERALS) tablet Take 1 tablet by mouth 2 (two) times daily.     ondansetron (ZOFRAN) 8 MG tablet Take 1 tablet (8 mg total) by mouth daily as needed for nausea or vomiting. 30 tablet 2   pantoprazole (PROTONIX) 40 MG tablet Take 1 tablet (40 mg total) by mouth 2 (two) times daily before a meal. 60 tablet 5   Potassium 99 MG TABS Take 198 mg by mouth 2 (two) times daily.     rOPINIRole (REQUIP) 1 MG tablet Take 1 tablet (1 mg total) by mouth at bedtime. 90  tablet 1   sildenafil (VIAGRA) 50 MG tablet Take 1 tablet (50 mg total) by mouth 1/2 hr before intercourse.  Limit use to 1 tab/24 hr 10 tablet 3   traMADol (ULTRAM) 50 MG tablet Take 2 tablets (100 mg total) by mouth every 12 (twelve) hours as needed (to fill on or after 05/20/2023). 120 tablet 2   No current facility-administered medications on file prior to visit.    Allergies  Allergen Reactions   Cymbalta [Duloxetine Hcl] Other (See Comments)    Caused anorgasmia   Doxycycline     Unknown   Other Nausea And Vomiting    Mayonnaise mustard ketchup   Robaxin [Methocarbamol] Other (See Comments)    Had hiccups x 4 hours after taking    Social History   Socioeconomic History   Marital status: Significant Other    Spouse name: Not on file   Number of children: Not on file   Years of education: Not on file   Highest education level: Not on file  Occupational History   Occupation: Product/process development scientist  Tobacco Use   Smoking status: Former    Current packs/day: 0.00    Types: Cigarettes    Quit date: 10/16/1988    Years since quitting: 35.2   Smokeless tobacco: Never  Vaping Use   Vaping status: Never Used  Substance and Sexual Activity   Alcohol use: No   Drug use: Yes    Comment: rare   Sexual activity: Not on file  Other Topics Concern   Not on file  Social History Narrative   Volunteers as IT sales professional   Social Drivers  of Health   Financial Resource Strain: Not on file  Food Insecurity: No Food Insecurity (03/10/2023)   Hunger Vital Sign    Worried About Running Out of Food in the Last Year: Never true    Ran Out of Food in the Last Year: Never true  Transportation Needs: No Transportation Needs (03/10/2023)   PRAPARE - Administrator, Civil Service (Medical): No    Lack of Transportation (Non-Medical): No  Physical Activity: Not on file  Stress: Not on file  Social Connections: Not on file  Intimate Partner Violence: Not At Risk (03/10/2023)   Humiliation, Afraid, Rape, and Kick questionnaire    Fear of Current or Ex-Partner: No    Emotionally Abused: No    Physically Abused: No    Sexually Abused: No    Family History  Problem Relation Age of Onset   Breast cancer Mother    Pancreatic cancer Mother    CAD Father    Colon cancer Paternal Grandfather     Past Surgical History:  Procedure Laterality Date   BACK SURGERY  1993   lumbar   BALLOON DILATION N/A 04/25/2023   Procedure: BALLOON DILATION;  Surgeon: Lanelle Bal, DO;  Location: [facility];  Service: Endoscopy;  Laterality: N/A;   BIOPSY  02/07/2019   Procedure: BIOPSY;  Surgeon: West Bali, MD;  Location: [facility];  Service: Endoscopy;;   BIOPSY  01/30/2022   Procedure: BIOPSY;  Surgeon: Lanelle Bal, DO;  Location: [facility];  Service: Endoscopy;;   BIOPSY  04/25/2023   Procedure: BIOPSY;  Surgeon: Lanelle Bal, DO;  Location: [facility];  Service: Endoscopy;;   CERVICAL SPINE SURGERY  2000   COLONOSCOPY N/A 02/07/2019   Dr. Darrick Penna: External hemorrhoids next colonoscopy in 10 years   ESOPHAGOGASTRODUODENOSCOPY N/A 02/07/2019   Dr.  Fields: Barrett's esophagus without dysplasia chronic inactive gastritis but no H. 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, nonbleeding duodenal diverticulum,   ESOPHAGOGASTRODUODENOSCOPY N/A 01/05/2020    Procedure: ESOPHAGOGASTRODUODENOSCOPY (EGD);  Surgeon: West Bali, MD;  Location: [facility];  Service: Endoscopy;  Laterality: N/A;  10:30am   ESOPHAGOGASTRODUODENOSCOPY (EGD) WITH PROPOFOL N/A 12/06/2020   Procedure: ESOPHAGOGASTRODUODENOSCOPY (EGD) WITH PROPOFOL;  Surgeon: Lanelle Bal, DO;  Location: [facility];  Service: Endoscopy;  Laterality: N/A;  11:45am   ESOPHAGOGASTRODUODENOSCOPY (EGD) WITH PROPOFOL N/A 12/22/2021   long-segment Barrett's s/p biopsy, gastric bypass with normal sized puch and intact staple line. GJ anastomosis with healthy mucosa. Gastritis.   ESOPHAGOGASTRODUODENOSCOPY (EGD) WITH PROPOFOL N/A 01/30/2022   Procedure: ESOPHAGOGASTRODUODENOSCOPY (EGD) WITH PROPOFOL;  Surgeon: Lanelle Bal, DO;  [facility]: [facility];  Service: Endoscopy;  Laterality: N/A;  11:00am   ESOPHAGOGASTRODUODENOSCOPY (EGD) WITH PROPOFOL N/A 03/11/2023   Procedure: ESOPHAGOGASTRODUODENOSCOPY (EGD) WITH PROPOFOL;  Surgeon: Benancio Deeds, MD;  [facility];  Service: Gastroenterology;  Laterality: N/A;   ESOPHAGOGASTRODUODENOSCOPY (EGD) WITH PROPOFOL N/A 04/25/2023   Procedure: ESOPHAGOGASTRODUODENOSCOPY (EGD) WITH PROPOFOL;  Surgeon: Lanelle Bal, DO;  [facility]: [facility];  Service: Endoscopy;  Laterality: N/A;  10:30 am, asa 3   GASTROJEJUNOSTOMY N/A 01/03/2021   Procedure: GASTROJEJUNOSTOMY;  Surgeon: [name], MD;  Location: [facility];  Service: General;  Laterality: N/A;   HAND SURGERY     SAVORY DILATION N/A 01/05/2020   Procedure: SAVORY DILATION;  Surgeon: West Bali, MD;  Location: [facility];  Service: Endoscopy;  Laterality: N/A;    ROS: Review of Systems Negative except as stated above  PHYSICAL EXAM: BP 128/63 (BP Location: Left Arm, Patient Position: Sitting, Cuff Size: Normal)   Pulse 68   Temp 97.9 F (36.6 C) (Oral)   Ht 5\' 10"  (1.778 m)   Wt 206 lb (93.4 kg)   SpO2 98%   BMI 29.56 kg/m   Wt Readings from Last  3 Encounters:  01/11/24 206 lb (93.4 kg)  12/11/23 204 lb 11.2 oz (92.9 kg)  09/07/23 204 lb (92.5 kg)    Physical Exam   General appearance - alert, well appearing, and in no distress Mental status - normal mood, behavior, speech, dress, motor activity, and thought processes Neck - supple, no significant adenopathy Chest - clear to auscultation, no wheezes, rales or rhonchi, symmetric air entry Heart - normal rate, regular rhythm, normal S1, S2, no murmurs, rubs, clicks or gallops Musculoskeletal -hands: Mild enlargement of the PIP joints.  Good radial and brachial pulses Extremities -no lower extremity edema. Diabetic Foot Exam - Simple   Simple Foot Form Diabetic Foot exam was performed with the following findings: Yes 01/11/2024 12:40 PM  Visual Inspection See comments: Yes Sensation Testing See comments: Yes Pulse Check Posterior Tibialis and Dorsalis pulse intact bilaterally: Yes Comments Patient with ecchymosis on the dorsal surface of the first 4 toes of the right foot.  Reports that he had accidentally dropped a piece of wood on his foot 1 to 2 weeks ago.  Reports this is better compared to what it was previously.  He has mild decrease sensation on plantar surface of left foot in certain areas.  Small callus on the lateral anterior aspect of the left foot.  No bony abnormality palpated on the ball of the left foot.         Latest Ref Rng & Units  09/07/2023   12:20 PM 04/23/2023   10:39 AM 03/12/2023    7:40 AM  CMP  Glucose 70 - 99 mg/dL 92  914  782   BUN 8 - 27 mg/dL 15  13  11    Creatinine 0.76 - 1.27 mg/dL 9.56  2.13  0.86   Sodium 134 - 144 mmol/L 140  134  132   Potassium 3.5 - 5.2 mmol/L 4.3  3.7  3.9   Chloride 96 - 106 mmol/L 102  101  103   CO2 20 - 29 mmol/L 23  24  23    Calcium 8.6 - 10.2 mg/dL 9.6  9.2  8.5   Total Protein 6.0 - 8.5 g/dL 6.8     Total Bilirubin 0.0 - 1.2 mg/dL <5.7     Alkaline Phos 44 - 121 IU/L 102     AST 0 - 40 IU/L 40     ALT 0  - 44 IU/L 56      Lipid Panel     Component Value Date/Time   CHOL 116 09/07/2023 1220   TRIG 141 09/07/2023 1220   HDL 44 09/07/2023 1220   CHOLHDL 2.6 09/07/2023 1220   CHOLHDL 4.8 10/17/2016 1012   VLDL NOT CALC 10/17/2016 1012   LDLCALC 48 09/07/2023 1220    CBC    Component Value Date/Time   WBC 5.1 12/11/2023 1121   WBC 5.1 04/23/2023 1039   RBC 4.25 12/11/2023 1121   HGB 13.3 12/11/2023 1121   HGB 10.0 (L) 05/17/2021 1657   HCT 40.5 12/11/2023 1121   HCT 31.6 (L) 05/17/2021 1657   PLT 206 12/11/2023 1121   PLT 236 05/17/2021 1657   MCV 95.3 12/11/2023 1121   MCV 89 05/17/2021 1657   MCH 31.3 12/11/2023 1121   MCHC 32.8 12/11/2023 1121   RDW 12.9 12/11/2023 1121   RDW 16.4 (H) 05/17/2021 1657   LYMPHSABS 1.2 12/11/2023 1121   LYMPHSABS 2.0 08/13/2019 1617   MONOABS 0.5 12/11/2023 1121   EOSABS 0.5 12/11/2023 1121   EOSABS 0.4 08/13/2019 1617   BASOSABS 0.1 12/11/2023 1121   BASOSABS 0.1 08/13/2019 1617    ASSESSMENT AND PLAN: 1. Diabetes mellitus treated with oral medication (HCC) (Primary) At goal with low blood sugar today in office.  Given small pack of chocolate chip cookies. Given his A1c level, I recommend that we decrease the glipizide further to 2.5 mg once a day.  It was changed to extended release.  Advised to check blood sugars daily for the next 2 to 3 weeks to make sure that they are staying good. - POCT glycosylated hemoglobin (Hb A1C) - POCT glucose (manual entry) - glipiZIDE (GLUCOTROL XL) 2.5 MG 24 hr tablet; Take 1 tablet (2.5 mg total) by mouth daily with breakfast.  Dispense: 90 tablet; Refill: 1  2. Hypertension associated with type 2 diabetes mellitus (HCC) At goal.  Continue Norvasc and lisinopril  3. Iron deficiency anemia, unspecified iron deficiency anemia type Hemoglobin has normalized.  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  He remains on iron supplement  4. Pain in both hands Likely  musculoskeletal.  He is on tramadol already to use as needed for arthritis pains.  5. Statin myopathy Cholesterol levels are good off statin.  6. Foot pain, left - Ambulatory referral to Podiatry  Patient was given the opportunity to ask questions.  Patient verbalized understanding of the plan and was able to repeat key elements of the plan.  This documentation was completed using Paediatric nurse.  Any transcriptional errors are unintentional.  Orders Placed This Encounter  Procedures   Ambulatory referral to Podiatry   POCT glycosylated hemoglobin (Hb A1C)   POCT glucose (manual entry)   POCT glucose (manual entry)     Requested Prescriptions   Signed Prescriptions Disp Refills   glipiZIDE (GLUCOTROL XL) 2.5 MG 24 hr tablet 90 tablet 1    Sig: Take 1 tablet (2.5 mg total) by mouth daily with breakfast.    Return in about 4 months (around 05/12/2024).  Jonah Blue, MD, FACP

## 2024-01-12 DIAGNOSIS — G4733 Obstructive sleep apnea (adult) (pediatric): Secondary | ICD-10-CM | POA: Diagnosis not present

## 2024-01-15 ENCOUNTER — Encounter: Payer: Self-pay | Admitting: Physician Assistant

## 2024-01-15 ENCOUNTER — Other Ambulatory Visit: Payer: Self-pay

## 2024-01-16 ENCOUNTER — Ambulatory Visit (INDEPENDENT_AMBULATORY_CARE_PROVIDER_SITE_OTHER)

## 2024-01-16 ENCOUNTER — Ambulatory Visit: Admitting: Podiatry

## 2024-01-16 DIAGNOSIS — S9031XA Contusion of right foot, initial encounter: Secondary | ICD-10-CM

## 2024-01-16 DIAGNOSIS — G5761 Lesion of plantar nerve, right lower limb: Secondary | ICD-10-CM

## 2024-01-16 DIAGNOSIS — G5782 Other specified mononeuropathies of left lower limb: Secondary | ICD-10-CM

## 2024-01-16 NOTE — Progress Notes (Signed)
 Subjective:  Patient ID: Jeremy [name], male    DOB: [birth year],  MRN: [id number]  Chief Complaint  Patient presents with   Foot Pain    Pt stated that he dropped a piece of wood on his right foot and at times he feels like he has a rock in his left when he walks     63 y.o. male presents with the above complaint.  Patient presents with right dorsal forefoot mild contusion.  Patient states that he dropped a piece of wood on his right foot.  He feels like hurts with ambulation.  But not too bad he also has a secondary complaint of left third interspace neuroma/pebble.  He states it hurts with ambulation or shoe pressure wanted get it evaluated.  Feels like a little pebble when he is walking on pain scale is 5 out of 10 dull aching nature   Review of Systems: Negative except as noted in the HPI. Denies N/V/F/Ch.  Past Medical History:  Diagnosis Date   Diabetes mellitus without complication (HCC)    GERD (gastroesophageal reflux disease)    Heart murmur    Hyperlipidemia    Hypertension    IDA (iron deficiency anemia)    Rash of entire body 03/2016   Sleep apnea     Current Outpatient Medications:    Accu-Chek Softclix Lancets lancets, Use to check blood sugar three times daily., Disp: 100 each, Rfl: 2   acetaminophen (TYLENOL) 500 MG tablet, Take 1,000 mg by mouth in the morning and at bedtime. , Disp: , Rfl:    amLODipine (NORVASC) 5 MG tablet, Take 1 tablet (5 mg total) by mouth daily., Disp: 90 tablet, Rfl: 1   ascorbic acid (VITAMIN C) 500 MG tablet, Take 500 mg by mouth 2 (two) times daily., Disp: , Rfl:    Blood Glucose Monitoring Suppl (ACCU-CHEK GUIDE) w/Device KIT, Use to check blood sugar three times daily., Disp: 1 kit, Rfl: 0   Chlorphen-Phenyleph-APAP (CORICIDIN D COLD/FLU/SINUS) 2-5-325 MG TABS, Take 1 tablet by mouth every 6 (six) hours as needed., Disp: 30 tablet, Rfl: 0   Cyanocobalamin (VITAMIN B-12 PO), Take 2,500 mcg by mouth daily., Disp: , Rfl:     cyclobenzaprine (FLEXERIL) 10 MG tablet, Take 1 tablet (10 mg total) by mouth 2 (two) times daily as needed for muscle spasms., Disp: 180 tablet, Rfl: 1   ferrous sulfate (FEROSUL) 325 (65 FE) MG tablet, Take 1 tablet (325 mg total) by mouth 2 (two) times daily with a meal., Disp: 180 tablet, Rfl: 1   fluticasone (FLONASE) 50 MCG/ACT nasal spray, Place 1 spray into both nostrils daily as needed for allergies or rhinitis., Disp: 16 g, Rfl: 2   glipiZIDE (GLUCOTROL XL) 2.5 MG 24 hr tablet, Take 1 tablet (2.5 mg total) by mouth daily with breakfast., Disp: 90 tablet, Rfl: 1   lisinopril (ZESTRIL) 20 MG tablet, Take 1 tablet (20 mg total) by mouth in the morning and at bedtime., Disp: 180 tablet, Rfl: 1   loratadine (CLARITIN) 10 MG tablet, Take 10 mg by mouth daily., Disp: , Rfl:    Magnesium Oxide 200 MG TABS, Take 600 mg by mouth in the morning and at bedtime., Disp: , Rfl:    mometasone (ASMANEX) 220 MCG/ACT inhaler, Inhale 2 puffs into the lungs 2 (two) times daily., Disp: 1 each, Rfl: 1   Multiple Vitamins-Minerals (MULTIVITAMIN WITH MINERALS) tablet, Take 1 tablet by mouth 2 (two) times daily., Disp: , Rfl:    ondansetron (ZOFRAN)  8 MG tablet, Take 1 tablet (8 mg total) by mouth daily as needed for nausea or vomiting., Disp: 30 tablet, Rfl: 2   pantoprazole (PROTONIX) 40 MG tablet, Take 1 tablet (40 mg total) by mouth 2 (two) times daily before a meal., Disp: 60 tablet, Rfl: 5   Potassium 99 MG TABS, Take 198 mg by mouth 2 (two) times daily., Disp: , Rfl:    rOPINIRole (REQUIP) 1 MG tablet, Take 1 tablet (1 mg total) by mouth at bedtime., Disp: 90 tablet, Rfl: 1   sildenafil (VIAGRA) 50 MG tablet, Take 1 tablet (50 mg total) by mouth 1/2 hr before intercourse.  Limit use to 1 tab/24 hr, Disp: 10 tablet, Rfl: 3   traMADol (ULTRAM) 50 MG tablet, Take 2 tablets (100 mg total) by mouth every 12 (twelve) hours as needed (to fill on or after 05/20/2023)., Disp: 120 tablet, Rfl: 2  Social History    Tobacco Use  Smoking Status Former   Current packs/day: 0.00   Types: Cigarettes   Quit date: 10/16/1988   Years since quitting: 35.2  Smokeless Tobacco Never    Allergies  Allergen Reactions   Cymbalta [Duloxetine Hcl] Other (See Comments)    Caused anorgasmia   Doxycycline     Unknown   Other Nausea And Vomiting    Mayonnaise mustard ketchup   Robaxin [Methocarbamol] Other (See Comments)    Had hiccups x 4 hours after taking   Objective:  There were no vitals filed for this visit. There is no height or weight on file to calculate BMI. Constitutional Well developed. Well nourished.  Vascular Dorsalis pedis pulses palpable bilaterally. Posterior tibial pulses palpable bilaterally. Capillary refill normal to all digits.  No cyanosis or clubbing noted. Pedal hair growth normal.  Neurologic Normal speech. Oriented to person, place, and time. Epicritic sensation to light touch grossly present bilaterally.  Dermatologic Nails well groomed and normal in appearance. No open wounds. No skin lesions.  Orthopedic: Left positive Mulder's click noted to left third interspace no negative capsulitis noted no pain with range of motion of the metatarsophalangeal joint.  Mild pain across the dorsum of the foot.  Mild ecchymosis noted.  No open wounds or lesion noted no concern for fracture noted   Radiographs: Time 3 views of skeletally shoulder right foot: No fracture noted no abnormalities noted.  Mild arthritis of the foot first metatarsophalangeal joint noted pes cavus foot structure noted plantar and posterior heel spurring Assessment:   1. Contusion of right foot, initial encounter   2. Interdigital neuroma of left foot    Plan:  Patient was evaluated and treated and all questions answered.  Left third interspace neuroma injection -All questions and concerns were discussed with the patient extensive detail given the amount of pain that he is having h he will benefit from  steroid injection of decrease acute inflammatory component around the nerve.  Patient agrees with plan like to proceed with injection near the greater -A steroid injection was performed at left third interspace using 1% plain Lidocaine and 10 mg of Kenalog. This was well tolerated.  Dorsal midfoot contusion right foot - Clinically pain is very mild.  At this time patient can continue ambulating in regular shoes.  Negative for x-rays.  No immobilization indicated

## 2024-01-17 ENCOUNTER — Ambulatory Visit: Payer: Self-pay

## 2024-01-17 ENCOUNTER — Encounter: Payer: Self-pay | Admitting: Internal Medicine

## 2024-01-17 ENCOUNTER — Ambulatory Visit: Admitting: Family Medicine

## 2024-01-17 ENCOUNTER — Other Ambulatory Visit: Payer: Self-pay

## 2024-01-17 ENCOUNTER — Encounter: Payer: Self-pay | Admitting: Family Medicine

## 2024-01-17 VITALS — BP 130/60 | HR 91 | Temp 98.2°F | Ht 70.0 in | Wt 205.0 lb

## 2024-01-17 DIAGNOSIS — B9789 Other viral agents as the cause of diseases classified elsewhere: Secondary | ICD-10-CM | POA: Insufficient documentation

## 2024-01-17 DIAGNOSIS — R509 Fever, unspecified: Secondary | ICD-10-CM | POA: Diagnosis not present

## 2024-01-17 DIAGNOSIS — J301 Allergic rhinitis due to pollen: Secondary | ICD-10-CM | POA: Insufficient documentation

## 2024-01-17 DIAGNOSIS — U071 COVID-19: Secondary | ICD-10-CM | POA: Diagnosis not present

## 2024-01-17 DIAGNOSIS — R067 Sneezing: Secondary | ICD-10-CM | POA: Insufficient documentation

## 2024-01-17 DIAGNOSIS — R0981 Nasal congestion: Secondary | ICD-10-CM | POA: Diagnosis not present

## 2024-01-17 LAB — POC COVID19 BINAXNOW: SARS Coronavirus 2 Ag: POSITIVE — AB

## 2024-01-17 LAB — POCT INFLUENZA A/B
Influenza A, POC: NEGATIVE
Influenza B, POC: NEGATIVE

## 2024-01-17 MED ORDER — PAXLOVID (150/100) 10 X 150 MG & 10 X 100MG PO TBPK
2.0000 | ORAL_TABLET | Freq: Two times a day (BID) | ORAL | 0 refills | Status: AC
  Filled 2024-01-17: qty 20, 5d supply, fill #0

## 2024-01-17 MED ORDER — CORICIDIN D COLD/FLU/SINUS 2-5-325 MG PO TABS
1.0000 | ORAL_TABLET | Freq: Four times a day (QID) | ORAL | 0 refills | Status: DC | PRN
  Filled 2024-01-17 – 2024-05-23 (×3): qty 30, fill #0

## 2024-01-17 MED ORDER — MOMETASONE FUROATE 220 MCG/ACT IN AEPB
2.0000 | INHALATION_SPRAY | Freq: Two times a day (BID) | RESPIRATORY_TRACT | 1 refills | Status: DC
  Filled 2024-01-17: qty 1, fill #0
  Filled 2024-01-29: qty 1, 30d supply, fill #0
  Filled 2024-03-04: qty 1, 30d supply, fill #1

## 2024-01-17 NOTE — Progress Notes (Signed)
 I,Jameka J Llittleton, CMA,acting as a Neurosurgeon for [employer], NP.,have documented all relevant documentation on the behalf of Ellender Hose, NP,as directed by  Ellender Hose, NP while in the presence of Ellender Hose, NP.  Subjective:  Patient ID: Jeremy [name] , male    DOB: 1961/08/14 , 63 y.o.   MRN: [id number]  Chief Complaint  Patient presents with   URI    HPI  Patient is a 62 year old male who presents today for cold symptoms. His complaints are , nasal congestion, headache, generalized body aches and a fever of 100.7. . He reports that the symptoms started yesterday and that he has only taken tylenol for his symptoms. Patient states that his significant other has been sick for about 2 weeks diagnosed with bronchitis, will do POCT COVID/ influenza testing to rule out.  URI  This is a new problem. The current episode started yesterday. The problem has been unchanged. The maximum temperature recorded prior to his arrival was 100.4 - 100.9 F. The fever has been present for 1 to 2 days. Associated symptoms include congestion, headaches, neck pain, rhinorrhea, sinus pain, sneezing and a sore throat. Pertinent negatives include no coughing or ear pain. Associated symptoms comments: bodyaches. He has tried NSAIDs and acetaminophen for the symptoms. The treatment provided mild relief.     Past Medical History:  Diagnosis Date   Diabetes mellitus without complication (HCC)    GERD (gastroesophageal reflux disease)    Heart murmur    Hyperlipidemia    Hypertension    IDA (iron deficiency anemia)    Rash of entire body 03/2016   Sleep apnea      Family History  Problem Relation Age of Onset   Breast cancer Mother    Pancreatic cancer Mother    CAD Father    Colon cancer Paternal Grandfather      Current Outpatient Medications:    Accu-Chek Softclix Lancets lancets, Use to check blood sugar three times daily., Disp: 100 each, Rfl: 2   acetaminophen (TYLENOL) 500 MG tablet, Take 1,000 mg  by mouth in the morning and at bedtime. , Disp: , Rfl:    amLODipine (NORVASC) 5 MG tablet, Take 1 tablet (5 mg total) by mouth daily., Disp: 90 tablet, Rfl: 1   ascorbic acid (VITAMIN C) 500 MG tablet, Take 500 mg by mouth 2 (two) times daily., Disp: , Rfl:    Blood Glucose Monitoring Suppl (ACCU-CHEK GUIDE) w/Device KIT, Use to check blood sugar three times daily., Disp: 1 kit, Rfl: 0   Chlorphen-Phenyleph-APAP (CORICIDIN D COLD/FLU/SINUS) 2-5-325 MG TABS, Take 1 tablet by mouth every 6 (six) hours as needed., Disp: 30 tablet, Rfl: 0   Cyanocobalamin (VITAMIN B-12 PO), Take 2,500 mcg by mouth daily., Disp: , Rfl:    cyclobenzaprine (FLEXERIL) 10 MG tablet, Take 1 tablet (10 mg total) by mouth 2 (two) times daily as needed for muscle spasms., Disp: 180 tablet, Rfl: 1   ferrous sulfate (FEROSUL) 325 (65 FE) MG tablet, Take 1 tablet (325 mg total) by mouth 2 (two) times daily with a meal., Disp: 180 tablet, Rfl: 1   fluticasone (FLONASE) 50 MCG/ACT nasal spray, Place 1 spray into both nostrils daily as needed for allergies or rhinitis., Disp: 16 g, Rfl: 2   glipiZIDE (GLUCOTROL XL) 2.5 MG 24 hr tablet, Take 1 tablet (2.5 mg total) by mouth daily with breakfast., Disp: 90 tablet, Rfl: 1   lisinopril (ZESTRIL) 20 MG tablet, Take 1 tablet (20 mg  total) by mouth in the morning and at bedtime., Disp: 180 tablet, Rfl: 1   loratadine (CLARITIN) 10 MG tablet, Take 10 mg by mouth daily., Disp: , Rfl:    Magnesium Oxide 200 MG TABS, Take 600 mg by mouth in the morning and at bedtime., Disp: , Rfl:    mometasone (ASMANEX) 220 MCG/ACT inhaler, Inhale 2 puffs into the lungs 2 (two) times daily., Disp: 1 each, Rfl: 1   Multiple Vitamins-Minerals (MULTIVITAMIN WITH MINERALS) tablet, Take 1 tablet by mouth 2 (two) times daily., Disp: , Rfl:    nirmatrelvir/ritonavir, renal dosing, (PAXLOVID, 150/100,) 10 x 150 MG & 10 x 100MG  TBPK, Take 2 tablets by mouth 2 (two) times daily for 5 days. Dosage for moderate renal  impairment (eGFR >/= 30 to <60 mL/min): 150 mg nirmatrelvir (one 150 mg tablet) with 100 mg ritonavir (one 100 mg tablet), with both tablets taken together twice daily for 5 days. Not recommended if eGFR < 30 mL/min.  PAXLOVID is not recommend in patients with severe hepatic impairment (Child-Pugh Class C)., Disp: 20 tablet, Rfl: 0   ondansetron (ZOFRAN) 8 MG tablet, Take 1 tablet (8 mg total) by mouth daily as needed for nausea or vomiting., Disp: 30 tablet, Rfl: 2   pantoprazole (PROTONIX) 40 MG tablet, Take 1 tablet (40 mg total) by mouth 2 (two) times daily before a meal., Disp: 60 tablet, Rfl: 5   Potassium 99 MG TABS, Take 198 mg by mouth 2 (two) times daily., Disp: , Rfl:    rOPINIRole (REQUIP) 1 MG tablet, Take 1 tablet (1 mg total) by mouth at bedtime., Disp: 90 tablet, Rfl: 1   sildenafil (VIAGRA) 50 MG tablet, Take 1 tablet (50 mg total) by mouth 1/2 hr before intercourse.  Limit use to 1 tab/24 hr, Disp: 10 tablet, Rfl: 3   traMADol (ULTRAM) 50 MG tablet, Take 2 tablets (100 mg total) by mouth every 12 (twelve) hours as needed (to fill on or after 05/20/2023)., Disp: 120 tablet, Rfl: 2   Allergies  Allergen Reactions   Cymbalta [Duloxetine Hcl] Other (See Comments)    Caused anorgasmia   Doxycycline     Unknown   Other Nausea And Vomiting    Mayonnaise mustard ketchup   Robaxin [Methocarbamol] Other (See Comments)    Had hiccups x 4 hours after taking     Review of Systems  Constitutional:  Positive for chills and fever.  HENT:  Positive for congestion, rhinorrhea, sinus pain, sneezing and sore throat. Negative for ear pain.   Respiratory:  Negative for cough.   Musculoskeletal:  Positive for neck pain.  Neurological:  Positive for headaches.     Today's Vitals   01/17/24 1624  BP: 130/60  Pulse: 91  Temp: 98.2 F (36.8 C)  TempSrc: Oral  SpO2: 96%  Weight: 205 lb (93 kg)  Height: 5\' 10"  (1.778 m)  PainSc: 7   PainLoc: Generalized   Body mass index is 29.41 kg/m.   Wt Readings from Last 3 Encounters:  01/17/24 205 lb (93 kg)  01/11/24 206 lb (93.4 kg)  12/11/23 204 lb 11.2 oz (92.9 kg)    The ASCVD Risk score (Arnett DK, et al., 2019) failed to calculate for the following reasons:   The valid total cholesterol range is 130 to 320 mg/dL  Objective:  Physical Exam HENT:     Head: Normocephalic.  Cardiovascular:     Rate and Rhythm: Normal rate and regular rhythm.  Pulmonary:  Effort: Pulmonary effort is normal.     Breath sounds: Normal breath sounds.  Neurological:     Mental Status: He is alert.         Assessment And Plan:  COVID-19 Assessment & Plan: Isolate for 5 days or until fever dissipates.  Orders: -     Paxlovid (150/100); Take 2 tablets by mouth 2 (two) times daily for 5 days. Dosage for moderate renal impairment (eGFR >/= 30 to <60 mL/min): 150 mg nirmatrelvir (one 150 mg tablet) with 100 mg ritonavir (one 100 mg tablet), with both tablets taken together twice daily for 5 days. Not recommended if eGFR < 30 mL/min.  PAXLOVID is not recommend in patients with severe hepatic impairment (Child-Pugh Class C).  Dispense: 20 tablet; Refill: 0  Fever, unspecified fever cause -     POCT Influenza A/B -     POC COVID-19 BinaxNow  Nasal congestion -     Mometasone Furoate; Inhale 2 puffs into the lungs 2 (two) times daily.  Dispense: 1 each; Refill: 1 -     Coricidin D Cold/Flu/Sinus; Take 1 tablet by mouth every 6 (six) hours as needed.  Dispense: 30 tablet; Refill: 0    Return if symptoms worsen or fail to improve, for Follow up with PCP.  Patient was given opportunity to ask questions. Patient verbalized understanding of the plan and was able to repeat key elements of the plan. All questions were answered to their satisfaction.    I, Ellender Hose, NP, have reviewed all documentation for this visit. The documentation on 01/21/2024 for the exam, diagnosis, procedures, and orders are all accurate and complete.    IF YOU HAVE  BEEN REFERRED TO A SPECIALIST, IT MAY TAKE 1-2 WEEKS TO SCHEDULE/PROCESS THE REFERRAL. IF YOU HAVE NOT HEARD FROM US/SPECIALIST IN TWO WEEKS, PLEASE GIVE [name] A CALL AT [phone].

## 2024-01-17 NOTE — Telephone Encounter (Signed)
 Copied from CRM [id number]. Topic: Clinical - Red Word Triage >> Jan 17, 2024  2:44 PM Marlow Baars wrote: Red Word that prompted transfer to Nurse Triage: The patient called in stating he is not feeling well at all. He also states his wife is sick too with similar symptoms. He as of this morning had a 100.6 fever and bad joint pain, congestion and neck pain. I will transfer him to [facility]    Chief Complaint: Sinus pain, body aches, fever 100.6. Symptoms: Above Frequency: Last night Pertinent Negatives: Patient denies  Disposition: [] ED /[] Urgent Care (no appt availability in office) / [x] Appointment(In office/virtual)/ []  [place] [facility]/ [] Home Care/ [] Refused Recommended Disposition /[] [place] [facility]/ []  Follow-up with PCP Additional Notes: Agrees with appointment.  Reason for Disposition  [1] Sinus pain (not just congestion) AND [2] fever  Answer Assessment - Initial Assessment Questions 1. LOCATION: "Where does it hurt?"      Face 2. ONSET: "When did the sinus pain start?"  (e.g., hours, days)      Last night 3. SEVERITY: "How bad is the pain?"   (Scale 1-10; mild, moderate or severe)   - MILD (1-3): doesn't interfere with normal activities    - MODERATE (4-7): interferes with normal activities (e.g., work or school) or awakens from sleep   - SEVERE (8-10): excruciating pain and patient unable to do any normal activities        6-7 4. RECURRENT SYMPTOM: "Have you ever had sinus problems before?" If Yes, ask: "When was the last time?" and "What happened that time?"      Yes 5. NASAL CONGESTION: "Is the nose blocked?" If Yes, ask: "Can you open it or must you breathe through your mouth?"     No 6. NASAL DISCHARGE: "Do you have discharge from your nose?" If so ask, "What color?"     Clear 7. FEVER: "Do you have a fever?" If Yes, ask: "What is it, how was it measured, and when did it start?"      100.6 8. OTHER SYMPTOMS: "Do you have any other symptoms?" (e.g., sore throat,  cough, earache, difficulty breathing)     Headache 9. PREGNANCY: "Is there any chance you are pregnant?" "When was your last menstrual period?"     N/a  Protocols used: Sinus Pain or Congestion-A-AH

## 2024-01-18 ENCOUNTER — Encounter: Payer: Self-pay | Admitting: Family Medicine

## 2024-01-18 ENCOUNTER — Other Ambulatory Visit: Payer: Self-pay

## 2024-01-18 DIAGNOSIS — G4733 Obstructive sleep apnea (adult) (pediatric): Secondary | ICD-10-CM | POA: Diagnosis not present

## 2024-01-19 DIAGNOSIS — G4733 Obstructive sleep apnea (adult) (pediatric): Secondary | ICD-10-CM | POA: Diagnosis not present

## 2024-01-21 ENCOUNTER — Encounter: Payer: Self-pay | Admitting: Internal Medicine

## 2024-01-21 NOTE — Assessment & Plan Note (Signed)
 Isolate for 5 days or until fever dissipates.

## 2024-01-22 ENCOUNTER — Other Ambulatory Visit: Payer: Self-pay | Admitting: Internal Medicine

## 2024-01-22 DIAGNOSIS — R252 Cramp and spasm: Secondary | ICD-10-CM

## 2024-01-23 ENCOUNTER — Encounter: Payer: Self-pay | Admitting: Gastroenterology

## 2024-01-28 ENCOUNTER — Encounter: Payer: Self-pay | Admitting: Internal Medicine

## 2024-01-29 ENCOUNTER — Other Ambulatory Visit: Payer: Self-pay

## 2024-01-29 ENCOUNTER — Ambulatory Visit: Attending: Internal Medicine

## 2024-01-29 DIAGNOSIS — R252 Cramp and spasm: Secondary | ICD-10-CM

## 2024-01-30 ENCOUNTER — Encounter: Payer: Self-pay | Admitting: Internal Medicine

## 2024-01-30 ENCOUNTER — Other Ambulatory Visit: Payer: Self-pay | Admitting: Internal Medicine

## 2024-01-30 ENCOUNTER — Other Ambulatory Visit: Payer: Self-pay

## 2024-01-30 DIAGNOSIS — E871 Hypo-osmolality and hyponatremia: Secondary | ICD-10-CM

## 2024-01-30 LAB — BASIC METABOLIC PANEL WITH GFR
BUN/Creatinine Ratio: 23 (ref 10–24)
BUN: 18 mg/dL (ref 8–27)
CO2: 22 mmol/L (ref 20–29)
Calcium: 9.8 mg/dL (ref 8.6–10.2)
Chloride: 96 mmol/L (ref 96–106)
Creatinine, Ser: 0.79 mg/dL (ref 0.76–1.27)
Glucose: 159 mg/dL — ABNORMAL HIGH (ref 70–99)
Potassium: 4.3 mmol/L (ref 3.5–5.2)
Sodium: 133 mmol/L — ABNORMAL LOW (ref 134–144)
eGFR: 102 mL/min/{1.73_m2} (ref >59)

## 2024-01-30 MED ORDER — AMLODIPINE BESYLATE 5 MG PO TABS
5.0000 mg | ORAL_TABLET | Freq: Every day | ORAL | 1 refills | Status: DC
  Filled 2024-01-30: qty 90, 90d supply, fill #0
  Filled 2024-03-04 – 2024-05-03 (×2): qty 90, 90d supply, fill #1

## 2024-02-08 ENCOUNTER — Ambulatory Visit: Attending: Internal Medicine

## 2024-02-08 ENCOUNTER — Other Ambulatory Visit: Payer: Self-pay

## 2024-02-08 DIAGNOSIS — E871 Hypo-osmolality and hyponatremia: Secondary | ICD-10-CM

## 2024-02-09 ENCOUNTER — Encounter: Payer: Self-pay | Admitting: Internal Medicine

## 2024-02-09 LAB — BASIC METABOLIC PANEL WITH GFR
BUN/Creatinine Ratio: 12 (ref 10–24)
BUN: 12 mg/dL (ref 8–27)
CO2: 22 mmol/L (ref 20–29)
Calcium: 10.2 mg/dL (ref 8.6–10.2)
Chloride: 98 mmol/L (ref 96–106)
Creatinine, Ser: 0.98 mg/dL (ref 0.76–1.27)
Glucose: 107 mg/dL — ABNORMAL HIGH (ref 70–99)
Potassium: 4.6 mmol/L (ref 3.5–5.2)
Sodium: 135 mmol/L (ref 134–144)
eGFR: 88 mL/min/{1.73_m2} (ref >59)

## 2024-02-13 ENCOUNTER — Other Ambulatory Visit: Payer: Self-pay

## 2024-02-13 ENCOUNTER — Ambulatory Visit: Admitting: Podiatry

## 2024-02-13 ENCOUNTER — Encounter: Payer: Self-pay | Admitting: Physician Assistant

## 2024-02-13 ENCOUNTER — Other Ambulatory Visit: Payer: Self-pay | Admitting: Internal Medicine

## 2024-02-13 DIAGNOSIS — G5782 Other specified mononeuropathies of left lower limb: Secondary | ICD-10-CM

## 2024-02-13 DIAGNOSIS — S9031XA Contusion of right foot, initial encounter: Secondary | ICD-10-CM

## 2024-02-13 MED ORDER — SUCRALFATE 1 GM/10ML PO SUSP
1.0000 g | Freq: Two times a day (BID) | ORAL | 2 refills | Status: AC
  Filled 2024-02-13: qty 600, 30d supply, fill #0
  Filled 2024-07-27: qty 600, 30d supply, fill #1

## 2024-02-13 MED ORDER — SODIUM CHLORIDE 1 G PO TABS
1.0000 g | ORAL_TABLET | Freq: Every day | ORAL | 3 refills | Status: DC
Start: 2024-02-13 — End: 2024-07-07
  Filled 2024-02-13: qty 30, 30d supply, fill #0
  Filled 2024-03-04 – 2024-03-24 (×3): qty 30, 30d supply, fill #1
  Filled 2024-04-21: qty 30, 30d supply, fill #2
  Filled 2024-05-23: qty 30, 30d supply, fill #3

## 2024-02-13 NOTE — Progress Notes (Signed)
 Subjective:  Patient ID: Jeremy [name], male    DOB: Jan 18, 1961,  MRN: [id number]  Chief Complaint  Patient presents with   Foot Pain    Pt stated that the injection helped a lot     63 y.o. male presents with the above complaint.  Patient presents with right dorsal neuroma.  He states he is doing a lot better injection helped denies any other acute complaints.   Review of Systems: Negative except as noted in the HPI. Denies N/V/F/Ch.  Past Medical History:  Diagnosis Date   Diabetes mellitus without complication (HCC)    GERD (gastroesophageal reflux disease)    Heart murmur    Hyperlipidemia    Hypertension    IDA (iron  deficiency anemia)    Rash of entire body 03/2016   Sleep apnea     Current Outpatient Medications:    Accu-Chek Softclix Lancets lancets, Use to check blood sugar three times daily., Disp: 100 each, Rfl: 2   acetaminophen  (TYLENOL ) 500 MG tablet, Take 1,000 mg by mouth in the morning and at bedtime. , Disp: , Rfl:    amLODipine  (NORVASC ) 5 MG tablet, Take 1 tablet (5 mg total) by mouth daily., Disp: 90 tablet, Rfl: 1   ascorbic acid (VITAMIN C) 500 MG tablet, Take 500 mg by mouth 2 (two) times daily., Disp: , Rfl:    Blood Glucose Monitoring Suppl (ACCU-CHEK GUIDE) w/Device KIT, Use to check blood sugar three times daily., Disp: 1 kit, Rfl: 0   Chlorphen-Phenyleph-APAP (CORICIDIN D COLD/FLU/SINUS) 2-5-325 MG TABS, Take 1 tablet by mouth every 6 (six) hours as needed., Disp: 30 tablet, Rfl: 0   Cyanocobalamin  (VITAMIN B-12 PO), Take 2,500 mcg by mouth daily., Disp: , Rfl:    cyclobenzaprine  (FLEXERIL ) 10 MG tablet, Take 1 tablet (10 mg total) by mouth 2 (two) times daily as needed for muscle spasms., Disp: 180 tablet, Rfl: 1   ferrous sulfate  (FEROSUL) 325 (65 FE) MG tablet, Take 1 tablet (325 mg total) by mouth 2 (two) times daily with a meal., Disp: 180 tablet, Rfl: 1   fluticasone  (FLONASE ) 50 MCG/ACT nasal spray, Place 1 spray into both nostrils daily as  needed for allergies or rhinitis., Disp: 16 g, Rfl: 2   glipiZIDE  (GLUCOTROL  XL) 2.5 MG 24 hr tablet, Take 1 tablet (2.5 mg total) by mouth daily with breakfast., Disp: 90 tablet, Rfl: 1   lisinopril  (ZESTRIL ) 20 MG tablet, Take 1 tablet (20 mg total) by mouth in the morning and at bedtime., Disp: 180 tablet, Rfl: 1   loratadine  (CLARITIN ) 10 MG tablet, Take 10 mg by mouth daily., Disp: , Rfl:    Magnesium  Oxide 200 MG TABS, Take 600 mg by mouth in the morning and at bedtime., Disp: , Rfl:    mometasone  (ASMANEX ) 220 MCG/ACT inhaler, Inhale 2 puffs into the lungs 2 (two) times daily., Disp: 1 each, Rfl: 1   Multiple Vitamins-Minerals (MULTIVITAMIN WITH MINERALS) tablet, Take 1 tablet by mouth 2 (two) times daily., Disp: , Rfl:    ondansetron  (ZOFRAN ) 8 MG tablet, Take 1 tablet (8 mg total) by mouth daily as needed for nausea or vomiting., Disp: 30 tablet, Rfl: 2   pantoprazole  (PROTONIX ) 40 MG tablet, Take 1 tablet (40 mg total) by mouth 2 (two) times daily before a meal., Disp: 60 tablet, Rfl: 5   Potassium 99 MG TABS, Take 198 mg by mouth 2 (two) times daily., Disp: , Rfl:    rOPINIRole  (REQUIP ) 1 MG tablet, Take 1 tablet (  1 mg total) by mouth at bedtime., Disp: 90 tablet, Rfl: 1   sildenafil  (VIAGRA ) 50 MG tablet, Take 1 tablet (50 mg total) by mouth 1/2 hr before intercourse.  Limit use to 1 tab/24 hr, Disp: 10 tablet, Rfl: 3   sodium chloride  1 g tablet, Take 1 tablet (1 g total) by mouth daily., Disp: 30 tablet, Rfl: 3   sucralfate  (CARAFATE ) 1 GM/10ML suspension, Take 10 mLs (1 g total) by mouth 2 (two) times daily., Disp: 600 mL, Rfl: 2   traMADol  (ULTRAM ) 50 MG tablet, Take 2 tablets (100 mg total) by mouth every 12 (twelve) hours as needed (to fill on or after 05/20/2023)., Disp: 120 tablet, Rfl: 2  Social History   Tobacco Use  Smoking Status Former   Current packs/day: 0.00   Types: Cigarettes   Quit date: 10/16/1988   Years since quitting: 35.3  Smokeless Tobacco Never     Allergies  Allergen Reactions   Cymbalta  [Duloxetine  Hcl] Other (See Comments)    Caused anorgasmia   Doxycycline      Unknown   Other Nausea And Vomiting    Mayonnaise mustard ketchup   Robaxin  [Methocarbamol ] Other (See Comments)    Had hiccups x 4 hours after taking   Objective:  There were no vitals filed for this visit. There is no height or weight on file to calculate BMI. Constitutional Well developed. Well nourished.  Vascular Dorsalis pedis pulses palpable bilaterally. Posterior tibial pulses palpable bilaterally. Capillary refill normal to all digits.  No cyanosis or clubbing noted. Pedal hair growth normal.  Neurologic Normal speech. Oriented to person, place, and time. Epicritic sensation to light touch grossly present bilaterally.  Dermatologic Nails well groomed and normal in appearance. No open wounds. No skin lesions.  Orthopedic: Left positive Mulder's click noted without any pain left third interspace no negative capsulitis noted no pain with range of motion of the metatarsophalangeal joint.  Mild pain across the dorsum of the foot.  Mild ecchymosis noted.  No open wounds or lesion noted no concern for fracture noted   Radiographs: Time 3 views of skeletally shoulder right foot: No fracture noted no abnormalities noted.  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  Plan:  Patient was evaluated and treated and all questions answered.  Left third interspace neuroma injection - Clinically healed and officially discharged from my care if any foot and ankle issues are future he will come back and see me.  At this time I discussed shoe gear modification and orthotics management he states understanding will work on.  Dorsal midfoot contusion right foot - Clinically pain is very mild.  At this time patient can continue ambulating in  regular shoes.  Negative for x-rays.  No immobilization indicated

## 2024-02-14 ENCOUNTER — Encounter: Payer: Self-pay | Admitting: Internal Medicine

## 2024-02-15 NOTE — Telephone Encounter (Signed)
 Called the patient to schedule an appointment, Verified name & DOB. Patent stated he does not need an appointment at this time. Asked the patient if he has any questions; he stated no. Advised the patient to give us  a call back if he has any questions or need to schedule an appointment. Patient acknowledged.

## 2024-02-18 ENCOUNTER — Telehealth: Payer: Self-pay | Admitting: Internal Medicine

## 2024-02-18 NOTE — Telephone Encounter (Signed)
 Rescheduled appointments per limited availability. The patient was just fine with this and is active on MyChart.

## 2024-02-21 ENCOUNTER — Other Ambulatory Visit: Payer: Self-pay

## 2024-02-21 ENCOUNTER — Encounter: Payer: Self-pay | Admitting: Internal Medicine

## 2024-02-21 ENCOUNTER — Ambulatory Visit: Attending: Internal Medicine | Admitting: Internal Medicine

## 2024-02-21 VITALS — BP 118/61 | HR 62 | Temp 97.9°F | Ht 70.0 in | Wt 210.0 lb

## 2024-02-21 DIAGNOSIS — M62838 Other muscle spasm: Secondary | ICD-10-CM

## 2024-02-21 DIAGNOSIS — S5011XA Contusion of right forearm, initial encounter: Secondary | ICD-10-CM | POA: Diagnosis not present

## 2024-02-21 DIAGNOSIS — R0781 Pleurodynia: Secondary | ICD-10-CM

## 2024-02-21 DIAGNOSIS — E119 Type 2 diabetes mellitus without complications: Secondary | ICD-10-CM | POA: Diagnosis not present

## 2024-02-21 DIAGNOSIS — Z7984 Long term (current) use of oral hypoglycemic drugs: Secondary | ICD-10-CM

## 2024-02-21 DIAGNOSIS — W548XXA Other contact with dog, initial encounter: Secondary | ICD-10-CM | POA: Diagnosis not present

## 2024-02-21 DIAGNOSIS — I10 Essential (primary) hypertension: Secondary | ICD-10-CM

## 2024-02-21 DIAGNOSIS — I152 Hypertension secondary to endocrine disorders: Secondary | ICD-10-CM

## 2024-02-21 MED ORDER — LISINOPRIL 20 MG PO TABS
20.0000 mg | ORAL_TABLET | Freq: Two times a day (BID) | ORAL | 1 refills | Status: DC
  Filled 2024-02-21: qty 180, 90d supply, fill #0
  Filled 2024-03-04: qty 60, 30d supply, fill #0
  Filled 2024-04-07: qty 60, 30d supply, fill #1
  Filled 2024-05-06: qty 60, 30d supply, fill #2
  Filled 2024-06-02 – 2024-06-13 (×2): qty 60, 30d supply, fill #3
  Filled 2024-07-08: qty 60, 30d supply, fill #4
  Filled 2024-08-10: qty 60, 30d supply, fill #5

## 2024-02-21 MED ORDER — ROPINIROLE HCL 1 MG PO TABS
1.0000 mg | ORAL_TABLET | Freq: Every day | ORAL | 1 refills | Status: DC
  Filled 2024-02-21 – 2024-04-07 (×3): qty 90, 90d supply, fill #0
  Filled 2024-05-23 – 2024-07-07 (×2): qty 90, 90d supply, fill #1

## 2024-02-21 MED ORDER — CYCLOBENZAPRINE HCL 10 MG PO TABS
10.0000 mg | ORAL_TABLET | Freq: Two times a day (BID) | ORAL | 1 refills | Status: DC | PRN
  Filled 2024-02-21: qty 180, 90d supply, fill #0
  Filled 2024-03-04: qty 60, 30d supply, fill #0
  Filled 2024-04-08: qty 60, 30d supply, fill #1
  Filled 2024-05-23: qty 60, 30d supply, fill #2
  Filled 2024-07-07: qty 60, 30d supply, fill #3
  Filled 2024-08-10: qty 60, 30d supply, fill #4
  Filled 2024-09-07: qty 60, 30d supply, fill #5

## 2024-02-21 NOTE — Progress Notes (Signed)
 Patient ID: Jeremy [name], male    DOB: August 17, 1961  MRN: [id number]  CC: Rib Injury (Rib injury 02/17/24 - reports pain is subsiding Jeremy [name] appt on 5/14 to address R knee)   Subjective: Jeremy [name] is a 63 y.o. male who presents for UC. 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  Myoview  neg 02/2021   Discussed the use of AI scribe software for clinical note transcription with the patient, who gave verbal consent to proceed.  History of Present Illness Jeremy [name] is a 63 year old male who presents with right sided rib pain after a fall.  He experienced rib pain after he tripped and fell on his driveway four days ago, landing on his right side. The pain is located on the anterior rib cage, below the breast. Initially, the pain was severe, affecting his ability to take deep breaths, cough, or sneeze, but it has improved significantly today. He has not sought prior medical evaluation. There is no significant bruising over the rib area. Pain occurs with coughing, sneezing, or bending over, but has improved. He denies fever or other systemic symptoms.  He has a bruise on his head and an injury to his knee from the fall. He has a history of nerve damage from a herniated disc on his left side in 1992, causing numbness in his left leg. He is diabetic and cautious about potential infections from injuries. Noted to have several scrapes/abrasions on the dorsal surface of the forearms.  He attributes this to being scratched by his dog.  Needs refill on Flexeril  and lisinopril .  Patient Active Problem List   Diagnosis Date Noted   Seasonal allergic rhinitis due to pollen 01/17/2024   Sneezing  01/17/2024   Viral respiratory illness 01/17/2024   COVID-19 01/17/2024   SIADH (syndrome of inappropriate ADH production) (HCC) 09/07/2023   Anastomotic stricture of gastrojejunostomy 04/07/2023   PUD (peptic ulcer disease) 04/02/2023   Anemia 04/02/2023   Marginal ulcer 03/12/2023   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   Coffee ground emesis 03/11/2023   NSAID long-term use 03/11/2023   Anemia, posthemorrhagic, acute 03/11/2023   Upper GI bleeding 03/10/2023   Hypertension associated with type 2 diabetes mellitus (HCC) 01/15/2023   Constipation 10/25/2022   Upper abdominal pain 07/06/2022   Nausea and vomiting 07/06/2022   Hyponatremia 06/23/2021   Low serum vitamin B12 03/23/2021   Vitamin D deficiency 03/23/2021   S/P bypass gastrojejunostomy 01/03/2021   Gastric outlet obstruction    Duodenal stricture    Primary osteoarthritis of both hips 11/15/2020   Aortic atherosclerosis (HCC) 08/30/2020   Coronary artery calcification 08/30/2020   Bilateral carpal tunnel syndrome 06/07/2020   Grief reaction 06/07/2020   Pyloric stenosis in adult 02/05/2020   Congenital hypertrophic pyloric stenosis    Duodenal web    Barrett's esophagus without dysplasia 05/19/2019   OSA (obstructive sleep apnea) 04/30/2019   Iron  deficiency anemia 11/25/2018   Phimosis of penis 11/19/2018   Esophageal dysphagia 08/19/2018   Benign prostatic hyperplasia with post-void dribbling 01/31/2018   Absence of bladder continence 01/31/2018   Obesity (BMI 30-39.9) 11/05/2017   GERD (gastroesophageal reflux disease) 06/03/2017   Chronic pain of both  knees 06/03/2017   Chronic lower back pain 06/03/2017   Type 2 diabetes mellitus without complication, without long-term current use of insulin  (HCC) 04/12/2016   Essential hypertension 04/12/2016     Current Outpatient Medications on File Prior to Visit  Medication Sig Dispense Refill   Accu-Chek Softclix Lancets lancets Use to check blood sugar three  times daily. 100 each 2   acetaminophen  (TYLENOL ) 500 MG tablet Take 1,000 mg by mouth in the morning and at bedtime.      amLODipine  (NORVASC ) 5 MG tablet Take 1 tablet (5 mg total) by mouth daily. 90 tablet 1   ascorbic acid (VITAMIN C) 500 MG tablet Take 500 mg by mouth 2 (two) times daily.     Blood Glucose Monitoring Suppl (ACCU-CHEK GUIDE) w/Device KIT Use to check blood sugar three times daily. 1 kit 0   Cyanocobalamin  (VITAMIN B-12 PO) Take 2,500 mcg by mouth daily.     ferrous sulfate  (FEROSUL) 325 (65 FE) MG tablet Take 1 tablet (325 mg total) by mouth 2 (two) times daily with a meal. 180 tablet 1   fluticasone  (FLONASE ) 50 MCG/ACT nasal spray Place 1 spray into both nostrils daily as needed for allergies or rhinitis. 16 g 2   glipiZIDE  (GLUCOTROL  XL) 2.5 MG 24 hr tablet Take 1 tablet (2.5 mg total) by mouth daily with breakfast. 90 tablet 1   loratadine  (CLARITIN ) 10 MG tablet Take 10 mg by mouth daily.     Magnesium  Oxide 200 MG TABS Take 600 mg by mouth in the morning and at bedtime.     Multiple Vitamins-Minerals (MULTIVITAMIN WITH MINERALS) tablet Take 1 tablet by mouth 2 (two) times daily.     ondansetron  (ZOFRAN ) 8 MG tablet Take 1 tablet (8 mg total) by mouth daily as needed for nausea or vomiting. 30 tablet 2   pantoprazole  (PROTONIX ) 40 MG tablet Take 1 tablet (40 mg total) by mouth 2 (two) times daily before a meal. 60 tablet 5   Potassium 99 MG TABS Take 198 mg by mouth 2 (two) times daily.     sildenafil  (VIAGRA ) 50 MG tablet Take 1 tablet (50 mg total) by mouth 1/2 hr before intercourse.  Limit use to 1 tab/24 hr 10 tablet 3   sodium chloride  1 g tablet Take 1 tablet (1 g total) by mouth daily. 30 tablet 3   sucralfate  (CARAFATE ) 1 GM/10ML suspension Take 10 mLs (1 g total) by mouth 2 (two) times daily. 600 mL 2   traMADol  (ULTRAM ) 50 MG tablet Take 2 tablets (100 mg total) by mouth every 12 (twelve) hours as needed (to fill on or after 05/20/2023). 120 tablet 2    Chlorphen-Phenyleph-APAP (CORICIDIN D COLD/FLU/SINUS) 2-5-325 MG TABS Take 1 tablet by mouth every 6 (six) hours as needed. (Patient not taking: Reported on 02/21/2024) 30 tablet 0   mometasone  (ASMANEX ) 220 MCG/ACT inhaler Inhale 2 puffs into the lungs 2 (two) times daily. (Patient not taking: Reported on 02/21/2024) 1 each 1   No current facility-administered medications on file prior to visit.    Allergies  Allergen Reactions   Cymbalta  [Duloxetine  Hcl] Other (See Comments)    Caused anorgasmia   Doxycycline      Unknown   Other Nausea And Vomiting    Mayonnaise mustard ketchup   Robaxin  [Methocarbamol ] Other (See Comments)    Had hiccups x 4 hours after taking    Social History   Socioeconomic History   Marital status: Significant Other    Spouse  name: Not on file   Number of children: Not on file   Years of education: Not on file   Highest education level: Not on file  Occupational History   Occupation: general contractor  Tobacco Use   Smoking status: Former    Current packs/day: 0.00    Types: Cigarettes    Quit date: 10/16/1988    Years since quitting: 35.3   Smokeless tobacco: Never  Vaping Use   Vaping status: Never Used  Substance and Sexual Activity   Alcohol use: No   Drug use: Yes    Comment: rare   Sexual activity: Not on file  Other Topics Concern   Not on file  Social History Narrative   Volunteers as IT sales professional   Social Drivers of Health   Financial Resource Strain: Medium Risk (02/21/2024)   Overall Financial Resource Strain (CARDIA)    Difficulty of Paying Living Expenses: Somewhat hard  Food Insecurity: Food Insecurity Present (02/21/2024)   Hunger Vital Sign    Worried About Running Out of Food in the Last Year: Never true    Ran Out of Food in the Last Year: Sometimes true  Transportation Needs: No Transportation Needs (02/21/2024)   PRAPARE - Administrator, Civil Service (Medical): No    Lack of Transportation (Non-Medical): No   Physical Activity: Insufficiently Active (02/21/2024)   Exercise Vital Sign    Days of Exercise per Week: 1 day    Minutes of Exercise per Session: 20 min  Stress: No Stress Concern Present (02/21/2024)   [employer] of Occupational Health - Occupational Stress Questionnaire    Feeling of Stress : Only a little  Social Connections: Socially Isolated (02/21/2024)   Social Connection and Isolation Panel [NHANES]    Frequency of Communication with Friends and Family: Once a week    Frequency of Social Gatherings with Friends and Family: Once a week    Attends Religious Services: More than 4 times per year    Active Member of [employer] or Organizations: No    Attends Banker Meetings: Never    Marital Status: Separated  Intimate Partner Violence: Not At Risk (02/21/2024)   Humiliation, Afraid, Rape, and Kick questionnaire    Fear of Current or Ex-Partner: No    Emotionally Abused: No    Physically Abused: No    Sexually Abused: No    Family History  Problem Relation Age of Onset   Breast cancer Mother    Pancreatic cancer Mother    CAD Father    Colon cancer Paternal Grandfather     Past Surgical History:  Procedure Laterality Date   BACK SURGERY  1993   lumbar   BALLOON DILATION N/A 04/25/2023   Procedure: BALLOON DILATION;  Surgeon: Vinetta Greening, DO;  [facility]: [facility];  Service: Endoscopy;  Laterality: N/A;   BIOPSY  02/07/2019   Procedure: BIOPSY;  Surgeon: Alyce Jubilee, MD;  Location: [facility];  Service: Endoscopy;;   BIOPSY  01/30/2022   Procedure: BIOPSY;  Surgeon: Vinetta Greening, DO;  [facility]: [facility];  Service: Endoscopy;;   BIOPSY  04/25/2023   Procedure: BIOPSY;  Surgeon: Vinetta Greening, DO;  [facility]: [facility];  Service: Endoscopy;;   CERVICAL SPINE SURGERY  2000   COLONOSCOPY N/A 02/07/2019   Dr. Nolene Baumgarten: External hemorrhoids next colonoscopy in 10 years   ESOPHAGOGASTRODUODENOSCOPY N/A 02/07/2019   Dr. Nolene Baumgarten:  Barrett's esophagus without dysplasia chronic inactive gastritis but no  H. 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, nonbleeding duodenal diverticulum,   ESOPHAGOGASTRODUODENOSCOPY N/A 01/05/2020   Procedure: ESOPHAGOGASTRODUODENOSCOPY (EGD);  Surgeon: Alyce Jubilee, MD;  [facility]: [facility];  Service: Endoscopy;  Laterality: N/A;  10:30am   ESOPHAGOGASTRODUODENOSCOPY (EGD) WITH PROPOFOL  N/A 12/06/2020   Procedure: ESOPHAGOGASTRODUODENOSCOPY (EGD) WITH PROPOFOL ;  Surgeon: Vinetta Greening, DO;  [facility]: [facility];  Service: Endoscopy;  Laterality: N/A;  11:45am   ESOPHAGOGASTRODUODENOSCOPY (EGD) WITH PROPOFOL  N/A 12/22/2021   long-segment Barrett's s/p biopsy, gastric bypass with normal sized puch and intact staple line. GJ anastomosis with healthy mucosa. Gastritis.   ESOPHAGOGASTRODUODENOSCOPY (EGD) WITH PROPOFOL  N/A 01/30/2022   Procedure: ESOPHAGOGASTRODUODENOSCOPY (EGD) WITH PROPOFOL ;  Surgeon: Vinetta Greening, DO;  [facility];  Service: Endoscopy;  Laterality: N/A;  11:00am   ESOPHAGOGASTRODUODENOSCOPY (EGD) WITH PROPOFOL  N/A 03/11/2023   Procedure: ESOPHAGOGASTRODUODENOSCOPY (EGD) WITH PROPOFOL ;  Surgeon: Ace Holder, MD;  Location: [facility];  Service: Gastroenterology;  Laterality: N/A;   ESOPHAGOGASTRODUODENOSCOPY (EGD) WITH PROPOFOL  N/A 04/25/2023   Procedure: ESOPHAGOGASTRODUODENOSCOPY (EGD) WITH PROPOFOL ;  Surgeon: Vinetta Greening, DO;  [facility];  Service: Endoscopy;  Laterality: N/A;  10:30 am, asa 3   GASTROJEJUNOSTOMY N/A 01/03/2021   Procedure: GASTROJEJUNOSTOMY;  Surgeon: Alanda Allegra, MD;  Location: [facility];  Service: General;  Laterality: N/A;   HAND SURGERY     SAVORY DILATION N/A 01/05/2020   Procedure: SAVORY DILATION;  Surgeon: Alyce Jubilee, MD;  [facility];  Service: Endoscopy;  Laterality: N/A;    ROS: Review of Systems Negative except as stated  above  PHYSICAL EXAM: BP 118/61 (BP Location: Left Arm, Patient Position: Sitting, Cuff Size: Normal)   Pulse 62   Temp 97.9 F (36.6 C) (Oral)   Ht 5\' 10"  (1.778 m)   Wt 210 lb (95.3 kg)   SpO2 97%   BMI 30.13 kg/m   Physical Exam   General appearance - alert, well appearing, and in no distress Mental status - normal mood, behavior, speech, dress, motor activity, and thought processes Chest - clear to auscultation, no wheezes, rales or rhonchi, symmetric air entry.  No splinting noted when taking deep breaths seen. Heart - normal rate, regular rhythm, normal S1, S2, no murmurs, rubs, clicks or gallops Musculoskeletal -there is no reproducible tenderness on palpation of the right anterior lower rib cage at this time. Skin -faint resolving ecchymosis noted over the right eyebrow laterally.  He has several abrasions with ecchymosis on the distal forearms.  Dried blood noted on these areas but they do not appear infected.     Latest Ref Rng & Units 02/08/2024    4:37 PM 01/29/2024   11:23 AM 09/07/2023   12:20 PM  CMP  Glucose 70 - 99 mg/dL 782  956  92   BUN 8 - 27 mg/dL 12  18  15    Creatinine 0.76 - 1.27 mg/dL 2.13  0.86  5.78   Sodium 134 - 144 mmol/L 135  133  140   Potassium 3.5 - 5.2 mmol/L 4.6  4.3  4.3   Chloride 96 - 106 mmol/L 98  96  102   CO2 20 - 29 mmol/L 22  22  23    Calcium  8.6 - 10.2 mg/dL 46.9  9.8  9.6   Total Protein 6.0 - 8.5 g/dL   6.8   Total Bilirubin 0.0 - 1.2 mg/dL   <6.2   Alkaline  Phos 44 - 121 IU/L   102   AST 0 - 40 IU/L   40   ALT 0 - 44 IU/L   56    Lipid Panel     Component Value Date/Time   CHOL 116 09/07/2023 1220   TRIG 141 09/07/2023 1220   HDL 44 09/07/2023 1220   CHOLHDL 2.6 09/07/2023 1220   CHOLHDL 4.8 10/17/2016 1012   VLDL NOT CALC 10/17/2016 1012   LDLCALC 48 09/07/2023 1220    CBC    Component Value Date/Time   WBC 5.1 12/11/2023 1121   WBC 5.1 04/23/2023 1039   RBC 4.25 12/11/2023 1121   HGB 13.3 12/11/2023 1121    HGB 10.0 (L) 05/17/2021 1657   HCT 40.5 12/11/2023 1121   HCT 31.6 (L) 05/17/2021 1657   PLT 206 12/11/2023 1121   PLT 236 05/17/2021 1657   MCV 95.3 12/11/2023 1121   MCV 89 05/17/2021 1657   MCH 31.3 12/11/2023 1121   MCHC 32.8 12/11/2023 1121   RDW 12.9 12/11/2023 1121   RDW 16.4 (H) 05/17/2021 1657   LYMPHSABS 1.2 12/11/2023 1121   LYMPHSABS 2.0 08/13/2019 1617   MONOABS 0.5 12/11/2023 1121   EOSABS 0.5 12/11/2023 1121   EOSABS 0.4 08/13/2019 1617   BASOSABS 0.1 12/11/2023 1121   BASOSABS 0.1 08/13/2019 1617    ASSESSMENT AND PLAN: 1. Rib pain on right side (Primary) Likely severe contusion versus fracture to the right anterior rib cage.  The fact that it has improved since 4 days ago suggest that it was most likely a contusion.  He is on tramadol  for arthritis pain.  Since the pain over the rib cage is getting better, I do not think we need to x-ray at this time as it will not change management  2. Dog scratch Advised to avoid having his dog scratch and break his skin as this can lead to infection.  Advised to put antibiotic ointment on the areas of his forearm until healed.  3. Muscle spasm Requested refill on Flexeril  - cyclobenzaprine  (FLEXERIL ) 10 MG tablet; Take 1 tablet (10 mg total) by mouth 2 (two) times daily as needed for muscle spasms.  Dispense: 180 tablet; Refill: 1  4. Hypertension associated with type 2 diabetes mellitus (HCC) Refill given on lisinopril . - lisinopril  (ZESTRIL ) 20 MG tablet; Take 1 tablet (20 mg total) by mouth in the morning and at bedtime.  Dispense: 180 tablet; Refill: 1  Patient was given the opportunity to ask questions.  Patient verbalized understanding of the plan and was able to repeat key elements of the plan.   This documentation was completed using Paediatric nurse.  Any transcriptional errors are unintentional.  No orders of the defined types were placed in this encounter.    Requested Prescriptions    Signed Prescriptions Disp Refills   cyclobenzaprine  (FLEXERIL ) 10 MG tablet 180 tablet 1    Sig: Take 1 tablet (10 mg total) by mouth 2 (two) times daily as needed for muscle spasms.   lisinopril  (ZESTRIL ) 20 MG tablet 180 tablet 1    Sig: Take 1 tablet (20 mg total) by mouth in the morning and at bedtime.   rOPINIRole  (REQUIP ) 1 MG tablet 90 tablet 1    Sig: Take 1 tablet (1 mg total) by mouth at bedtime.    No follow-ups on file.  Concetta Dee, MD, FACP

## 2024-02-21 NOTE — Patient Instructions (Signed)
 VISIT SUMMARY:  Today, you were seen for rib pain following a fall on your driveway four days ago. You also have a bruise on your head and an injury to your knee from the fall. Your pain has improved significantly since the incident, and you have no signs of serious complications. We also discussed your diabetes and the importance of preventing infections.  YOUR PLAN:  -RIB CONTUSION: A rib contusion is a bruise on the rib caused by trauma. Your symptoms are improving, and there is no need for an X-ray as it would not change the treatment plan. You should rest and be cautious with physical activities to allow healing.  -DIABETES MELLITUS: Diabetes mellitus is a condition that affects your body's ability to regulate blood sugar levels, increasing your risk of infections. You should be cautious with any injuries, such as dog scratches, to prevent infections.  INSTRUCTIONS:  Please rest and avoid strenuous activities to help your rib heal. Be cautious with any injuries to prevent infections, especially due to your diabetes. If you experience any worsening symptoms or signs of infection, please seek medical attention.

## 2024-02-27 ENCOUNTER — Encounter: Admitting: Sports Medicine

## 2024-03-03 ENCOUNTER — Ambulatory Visit: Admitting: Sports Medicine

## 2024-03-03 ENCOUNTER — Encounter: Payer: Self-pay | Admitting: Physician Assistant

## 2024-03-03 ENCOUNTER — Encounter: Payer: Self-pay | Admitting: Sports Medicine

## 2024-03-03 ENCOUNTER — Other Ambulatory Visit (INDEPENDENT_AMBULATORY_CARE_PROVIDER_SITE_OTHER): Payer: Self-pay

## 2024-03-03 DIAGNOSIS — G8929 Other chronic pain: Secondary | ICD-10-CM

## 2024-03-03 DIAGNOSIS — M21372 Foot drop, left foot: Secondary | ICD-10-CM

## 2024-03-03 DIAGNOSIS — M25561 Pain in right knee: Secondary | ICD-10-CM | POA: Diagnosis not present

## 2024-03-03 DIAGNOSIS — M1120 Other chondrocalcinosis, unspecified site: Secondary | ICD-10-CM

## 2024-03-03 DIAGNOSIS — M48061 Spinal stenosis, lumbar region without neurogenic claudication: Secondary | ICD-10-CM | POA: Diagnosis not present

## 2024-03-03 DIAGNOSIS — R29898 Other symptoms and signs involving the musculoskeletal system: Secondary | ICD-10-CM | POA: Diagnosis not present

## 2024-03-03 NOTE — Progress Notes (Signed)
 Patient says that he had two falls in the same week about two weeks ago, and both times he landed directly on the right knee. He says the second time he fell, he was holding a 50lb pole. He did use compression which helped some with his swelling, although he has noticed swelling on the sides of his patella at night. He is feeling much better today than he was when he fell. He says that he had cut his dose of Tramadol  in half, but did go back to his prescribed dose to help with his knee pain.

## 2024-03-03 NOTE — Progress Notes (Signed)
 Jeremy [name] - 63 y.o. male MRN [id number]  Date of birth: 11-22-1960  Office Visit Note: Visit Date: 03/03/2024 PCP: Lawrance Presume, MD Referred by: Lawrance Presume, MD  Subjective: Chief Complaint  Patient presents with   Right Knee - Pain   HPI: Jeremy [name] is a pleasant 63 y.o. male who presents today for follow-up of chronic right knee pain status post mechanical fall.  Also having difficulty walking with reported left ankle foot drop.  Right knee -has had some issues in the past with knee exacerbated as she had 2 falls in the same week.  Fell directly onto the right knee.  He had quite a bit of pain and swelling initially.  This has gotten better with time, activity modification.  He also has been using his tramadol  medication.  His pain is much better in the knee now but is worried why he is falling.  Left ankle/gait issues - he has a history of lumbar spinal stenosis.  He also has a history of L3-L4 or L4-L5 disc herniation/protrusion.  He did have surgery back in 1992 with Dr. Cipriano Creeks.  He currently is seen at [facility].  He also uses Flexeril  twice daily.  He does have some back tightness, but having issues with reported left foot drop.  He feels like his big toe drags on the ground when he walks longer distances.  Pertinent ROS were reviewed with the patient and found to be negative unless otherwise specified above in HPI.   Assessment & Plan: Visit Diagnoses:  1. Chronic pain of right knee   2. Acquired left foot drop   3. Spinal stenosis of lumbar region, unspecified whether neurogenic claudication present   4. Ankle weakness   5. Chondrocalcinosis articularis    Plan: Impression is right knee pain that was exacerbated by mechanical falls.  Has a improving small effusion on the knee joint with evidence of chondrocalcinosis.  Given this is largely improved, recommended ice/heat and compression.  He is more worried about his gait with reported  evidence of foot drop.  This was more progressive and fatigable when I watched him ambulate today.  He is following up with [facility] which I would like him to continue.  I would like to send him to formalized physical therapy to work on mobility about his lumbar spine but also with ankle dorsiflexion and strengthening.  We did discuss that if this is a true neurologic foot drop, we could consider an AFO ankle brace, but I would like to see what sort of improvements he can make with his gait with physical therapy first - recommended seeing [facility] for this.  Additional considerations may include updated MRI of the lumbar spine.  He will continue his tramadol  50 mg, 2 tablets twice daily as needed.  He also may use Flexeril  10 mg twice daily as needed.  Follow-up in 6 weeks.  Follow-up: Return in about 6 weeks (around 04/14/2024) for for L-foot drop/R-knee   Meds & Orders: No orders of the defined types were placed in this encounter.   Orders Placed This Encounter  Procedures   XR Knee Complete 4 Views Right     Procedures: No procedures performed      Clinical History: No specialty comments available.  He reports that he quit smoking about 35 years ago. His smoking use included cigarettes. He has never used smokeless tobacco.  Recent Labs    03/11/23 0058  09/07/23 1120 01/11/24 1025  HGBA1C 4.6* 5.2 5.8    Objective:    Physical Exam  Gen: Well-appearing, in no acute distress; non-toxic CV: Well-perfused. Warm.  Resp: Breathing unlabored on room air; no wheezing. Psych: Fluid speech in conversation; appropriate affect; normal thought process  Ortho Exam - Right knee: There is a small effusion on the right knee.  No joint line tenderness.  There is some pain with palpation of the tibial tubercle and infrapatellar bursa.  Range of motion is preserved.  - Gait analysis: Patient walks with an appropriate heel-to-toe transition.  Initially does not have foot drop, but with  prolonged walking he does drag the left toe and forefoot compared to the right side.  He has 4/5 weakness with great toe extension and very slight weakness with ankle dorsiflexion compared to the contralateral side.  He has 2+ patellar and 1+ Achilles reflexes bilaterally.  Imaging:  *Recent MRI shows prior laminectomy at L4 on the left.  Agree with below findings.  Narrative & Impression  CLINICAL DATA:  Lumbar spinal stenosis.  Prior back surgery.   EXAM: MRI LUMBAR SPINE WITHOUT AND WITH CONTRAST   TECHNIQUE: Multiplanar and multiecho pulse sequences of the lumbar spine were obtained without and with intravenous contrast.   CONTRAST:  20mL MULTIHANCE  GADOBENATE DIMEGLUMINE  529 MG/ML IV SOLN   COMPARISON:  Lumbar radiographs 12/25/2016. No prior MRI for comparison.   FINDINGS: Segmentation:  Normal   Alignment:  Mild retrolisthesis L1-2, L2-3, L3-4.   Vertebrae: Negative for fracture or mass. No enhancing lesions identified. Normal bone marrow. Mild scoliosis.   Conus medullaris: Extends to the L1-2 level and appears normal.   Paraspinal and other soft tissues: Negative   Disc levels:   L1-2: Moderately large left paracentral disc protrusion with downgoing disc material. Moderate spinal stenosis. Subarticular stenosis bilaterally   L2-3: Mild retrolisthesis. Diffuse disc bulging with a small right paracentral disc protrusion. Bilateral facet hypertrophy. Moderate spinal stenosis. Subarticular stenosis bilaterally   L3-4: Mild retrolisthesis. Disc degeneration with diffuse disc bulging. Moderate facet hypertrophy. Moderate spinal stenosis. Moderate subarticular and foraminal stenosis bilaterally.   L4-5: Laminectomy on the left with adequate decompression of the spinal canal. Diffuse endplate spurring and disc bulging. Bilateral facet hypertrophy. Marked subarticular stenosis bilaterally.   L5-S1: Diffuse endplate spurring left greater than right. Bilateral facet  hypertrophy. Severe subarticular stenosis bilaterally.   IMPRESSION: Moderately large central and left paracentral disc protrusion at L1-2 with moderate spinal stenosis and subarticular stenosis bilaterally.   Moderate spinal stenosis L2-3 with a small right paracentral disc protrusion. Subarticular stenosis bilaterally.   Moderate spinal stenosis L3-4 with moderate subarticular foraminal stenosis bilaterally.   Laminectomy on the left at L4-5. Marked subarticular stenosis bilaterally   Marked subarticular stenosis bilaterally L5-S1 due to endplate spurring and facet hypertrophy.     Electronically Signed   By: Anastasio Balsam M.D.   On: 01/13/2017 11:21    Past Medical/Family/Surgical/Social History: Medications & Allergies reviewed per EMR, new medications updated. Patient Active Problem List   Diagnosis Date Noted   Seasonal allergic rhinitis due to pollen 01/17/2024   Sneezing 01/17/2024   Viral respiratory illness 01/17/2024   COVID-19 01/17/2024   SIADH (syndrome of inappropriate ADH production) (HCC) 09/07/2023   Anastomotic stricture of gastrojejunostomy 04/07/2023   PUD (peptic ulcer disease) 04/02/2023   Anemia 04/02/2023   Marginal ulcer 03/12/2023   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   Coffee ground emesis 03/11/2023   NSAID long-term use 03/11/2023  Anemia, posthemorrhagic, acute 03/11/2023   Upper GI bleeding 03/10/2023   Hypertension associated with type 2 diabetes mellitus (HCC) 01/15/2023   Constipation 10/25/2022   Upper abdominal pain 07/06/2022   Nausea and vomiting 07/06/2022   Hyponatremia 06/23/2021   Low serum vitamin B12 03/23/2021   Vitamin D deficiency 03/23/2021   S/P bypass gastrojejunostomy 01/03/2021   Gastric outlet obstruction    Duodenal stricture    Primary osteoarthritis of both hips 11/15/2020   Aortic atherosclerosis (HCC) 08/30/2020   Coronary artery calcification 08/30/2020   Bilateral carpal tunnel syndrome  06/07/2020   Grief reaction 06/07/2020   Pyloric stenosis in adult 02/05/2020   Congenital hypertrophic pyloric stenosis    Duodenal web    Barrett's esophagus without dysplasia 05/19/2019   OSA (obstructive sleep apnea) 04/30/2019   Iron  deficiency anemia 11/25/2018   Phimosis of penis 11/19/2018   Esophageal dysphagia 08/19/2018   Benign prostatic hyperplasia with post-void dribbling 01/31/2018   Absence of bladder continence 01/31/2018   Obesity (BMI 30-39.9) 11/05/2017   GERD (gastroesophageal reflux disease) 06/03/2017   Chronic pain of both knees 06/03/2017   Chronic lower back pain 06/03/2017   Type 2 diabetes mellitus without complication, without long-term current use of insulin  (HCC) 04/12/2016   Essential hypertension 04/12/2016   Past Medical History:  Diagnosis Date   Diabetes mellitus without complication (HCC)    GERD (gastroesophageal reflux disease)    Heart murmur    Hyperlipidemia    Hypertension    IDA (iron  deficiency anemia)    Rash of entire body 03/2016   Sleep apnea    Family History  Problem Relation Age of Onset   Breast cancer Mother    Pancreatic cancer Mother    CAD Father    Colon cancer Paternal Grandfather    Past Surgical History:  Procedure Laterality Date   BACK SURGERY  1993   lumbar   BALLOON DILATION N/A 04/25/2023   Procedure: BALLOON DILATION;  Surgeon: Vinetta Greening, DO;  Location: [facility];  Service: Endoscopy;  Laterality: N/A;   BIOPSY  02/07/2019   Procedure: BIOPSY;  Surgeon: Alyce Jubilee, MD;  Location: [facility];  Service: Endoscopy;;   BIOPSY  01/30/2022   Procedure: BIOPSY;  Surgeon: Vinetta Greening, DO;  Location: [facility];  Service: Endoscopy;;   BIOPSY  04/25/2023   Procedure: BIOPSY;  Surgeon: Vinetta Greening, DO;  Location: [facility];  Service: Endoscopy;;   CERVICAL SPINE SURGERY  2000   COLONOSCOPY N/A 02/07/2019   Dr. Nolene Baumgarten: External hemorrhoids next colonoscopy in 10 years    ESOPHAGOGASTRODUODENOSCOPY N/A 02/07/2019   Dr. Nolene Baumgarten: Barrett's esophagus without dysplasia chronic inactive gastritis but no H. 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, nonbleeding duodenal diverticulum,   ESOPHAGOGASTRODUODENOSCOPY N/A 01/05/2020   Procedure: ESOPHAGOGASTRODUODENOSCOPY (EGD);  Surgeon: Alyce Jubilee, MD;  Location: [facility];  Service: Endoscopy;  Laterality: N/A;  10:30am   ESOPHAGOGASTRODUODENOSCOPY (EGD) WITH PROPOFOL  N/A 12/06/2020   Procedure: ESOPHAGOGASTRODUODENOSCOPY (EGD) WITH PROPOFOL ;  Surgeon: Vinetta Greening, DO;  Location: [facility];  Service: Endoscopy;  Laterality: N/A;  11:45am   ESOPHAGOGASTRODUODENOSCOPY (EGD) WITH PROPOFOL  N/A 12/22/2021   long-segment Barrett's s/p biopsy, gastric bypass with normal sized puch and intact staple line. GJ anastomosis with healthy mucosa. Gastritis.   ESOPHAGOGASTRODUODENOSCOPY (EGD) WITH PROPOFOL  N/A 01/30/2022   Procedure: ESOPHAGOGASTRODUODENOSCOPY (EGD) WITH PROPOFOL ;  Surgeon: Vinetta Greening, DO;  Location:  [facility];  Service: Endoscopy;  Laterality: N/A;  11:00am   ESOPHAGOGASTRODUODENOSCOPY (EGD) WITH PROPOFOL  N/A 03/11/2023   Procedure: ESOPHAGOGASTRODUODENOSCOPY (EGD) WITH PROPOFOL ;  Surgeon: Ace Holder, MD;  Location[facility];  Service: Gastroenterology;  Laterality: N/A;   ESOPHAGOGASTRODUODENOSCOPY (EGD) WITH PROPOFOL  N/A 04/25/2023   Procedure: ESOPHAGOGASTRODUODENOSCOPY (EGD) WITH PROPOFOL ;  Surgeon: Vinetta Greening, DO;  Location: [facility];  Service: Endoscopy;  Laterality: N/A;  10:30 am, asa 3   GASTROJEJUNOSTOMY N/A 01/03/2021   Procedure: GASTROJEJUNOSTOMY;  Surgeon: Alanda Allegra, MD;  Location: [facility];  Service: General;  Laterality: N/A;   HAND SURGERY     SAVORY DILATION N/A 01/05/2020   Procedure: SAVORY DILATION;  Surgeon: Alyce Jubilee, MD;  Location: [facility];  Service: Endoscopy;  Laterality: N/A;    Social History   Occupational History   Occupation: Product/process development scientist  Tobacco Use   Smoking status: Former    Current packs/day: 0.00    Types: Cigarettes    Quit date: 10/16/1988    Years since quitting: 35.4   Smokeless tobacco: Never  Vaping Use   Vaping status: Never Used  Substance and Sexual Activity   Alcohol use: No   Drug use: Yes    Comment: rare   Sexual activity: Not on file

## 2024-03-04 ENCOUNTER — Other Ambulatory Visit: Payer: Self-pay

## 2024-03-04 ENCOUNTER — Other Ambulatory Visit: Payer: Self-pay | Admitting: Internal Medicine

## 2024-03-04 DIAGNOSIS — D509 Iron deficiency anemia, unspecified: Secondary | ICD-10-CM

## 2024-03-04 MED ORDER — FERROUS SULFATE 325 (65 FE) MG PO TABS
325.0000 mg | ORAL_TABLET | Freq: Two times a day (BID) | ORAL | 1 refills | Status: DC
  Filled 2024-03-04 – 2024-03-24 (×2): qty 180, 90d supply, fill #0
  Filled 2024-07-07: qty 180, 90d supply, fill #1

## 2024-03-05 ENCOUNTER — Other Ambulatory Visit: Payer: Self-pay

## 2024-03-07 ENCOUNTER — Other Ambulatory Visit: Payer: Self-pay

## 2024-03-11 ENCOUNTER — Other Ambulatory Visit: Payer: Medicaid Other

## 2024-03-11 ENCOUNTER — Ambulatory Visit: Payer: Medicaid Other | Admitting: Internal Medicine

## 2024-03-12 ENCOUNTER — Encounter: Payer: Self-pay | Admitting: Physician Assistant

## 2024-03-12 ENCOUNTER — Other Ambulatory Visit: Payer: Self-pay

## 2024-03-13 ENCOUNTER — Other Ambulatory Visit: Payer: Self-pay

## 2024-03-13 ENCOUNTER — Ambulatory Visit: Attending: Sports Medicine

## 2024-03-13 DIAGNOSIS — M48061 Spinal stenosis, lumbar region without neurogenic claudication: Secondary | ICD-10-CM | POA: Insufficient documentation

## 2024-03-13 DIAGNOSIS — R29898 Other symptoms and signs involving the musculoskeletal system: Secondary | ICD-10-CM | POA: Diagnosis not present

## 2024-03-13 DIAGNOSIS — M21372 Foot drop, left foot: Secondary | ICD-10-CM | POA: Diagnosis not present

## 2024-03-13 DIAGNOSIS — R2689 Other abnormalities of gait and mobility: Secondary | ICD-10-CM | POA: Insufficient documentation

## 2024-03-13 DIAGNOSIS — M6281 Muscle weakness (generalized): Secondary | ICD-10-CM | POA: Insufficient documentation

## 2024-03-13 NOTE — Therapy (Signed)
 OUTPATIENT PHYSICAL THERAPY LOWER EXTREMITY EVALUATION   Patient Name: Jeremy [name] MRN: [id number] DOB:[birth year], 63 y.o., male Today's Date: 03/13/2024  END OF SESSION:  PT End of Session - 03/13/24 1755     Visit Number 1    Number of Visits 4    Date for PT Re-Evaluation 05/13/24    Authorization Type MCD    PT Start Time 1700    PT Stop Time 1745    PT Time Calculation (min) 45 min    Activity Tolerance Patient tolerated treatment well    Behavior During Therapy [name] for tasks assessed/performed             Past Medical History:  Diagnosis Date   Diabetes mellitus without complication (HCC)    GERD (gastroesophageal reflux disease)    Heart murmur    Hyperlipidemia    Hypertension    IDA (iron  deficiency anemia)    Rash of entire body 03/2016   Sleep apnea    Past Surgical History:  Procedure Laterality Date   BACK SURGERY  1993   lumbar   BALLOON DILATION N/A 04/25/2023   Procedure: BALLOON DILATION;  Surgeon: Vinetta Greening, DO;  Location: [facility];  Service: Endoscopy;  Laterality: N/A;   BIOPSY  02/07/2019   Procedure: BIOPSY;  Surgeon: Alyce Jubilee, MD;  Location: [facility];  Service: Endoscopy;;   BIOPSY  01/30/2022   Procedure: BIOPSY;  Surgeon: Vinetta Greening, DO;  [facility]: [facility];  Service: Endoscopy;;   BIOPSY  04/25/2023   Procedure: BIOPSY;  Surgeon: Vinetta Greening, DO;  [facility]: [facility];  Service: Endoscopy;;   CERVICAL SPINE SURGERY  2000   COLONOSCOPY N/A 02/07/2019   Dr. Nolene Baumgarten: External hemorrhoids next colonoscopy in 10 years   ESOPHAGOGASTRODUODENOSCOPY N/A 02/07/2019   Dr. Nolene Baumgarten: Barrett's esophagus without dysplasia chronic inactive gastritis but no H. 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, nonbleeding duodenal diverticulum,   ESOPHAGOGASTRODUODENOSCOPY N/A 01/05/2020   Procedure: ESOPHAGOGASTRODUODENOSCOPY (EGD);  Surgeon: Alyce Jubilee,  MD;  [facility]: [facility];  Service: Endoscopy;  Laterality: N/A;  10:30am   ESOPHAGOGASTRODUODENOSCOPY (EGD) WITH PROPOFOL  N/A 12/06/2020   Procedure: ESOPHAGOGASTRODUODENOSCOPY (EGD) WITH PROPOFOL ;  Surgeon: Vinetta Greening, DO;  [facility]: [facility];  Service: Endoscopy;  Laterality: N/A;  11:45am   ESOPHAGOGASTRODUODENOSCOPY (EGD) WITH PROPOFOL  N/A 12/22/2021   long-segment Barrett's s/p biopsy, gastric bypass with normal sized puch and intact staple line. GJ anastomosis with healthy mucosa. Gastritis.   ESOPHAGOGASTRODUODENOSCOPY (EGD) WITH PROPOFOL  N/A 01/30/2022   Procedure: ESOPHAGOGASTRODUODENOSCOPY (EGD) WITH PROPOFOL ;  Surgeon: Vinetta Greening, DO;  [facility]: [facility];  Service: Endoscopy;  Laterality: N/A;  11:00am   ESOPHAGOGASTRODUODENOSCOPY (EGD) WITH PROPOFOL  N/A 03/11/2023   Procedure: ESOPHAGOGASTRODUODENOSCOPY (EGD) WITH PROPOFOL ;  Surgeon: Ace Holder, MD;  [facility]: [facility];  Service: Gastroenterology;  Laterality: N/A;   ESOPHAGOGASTRODUODENOSCOPY (EGD) WITH PROPOFOL  N/A 04/25/2023   Procedure: ESOPHAGOGASTRODUODENOSCOPY (EGD) WITH PROPOFOL ;  Surgeon: Vinetta Greening, DO;  [facility];  Service: Endoscopy;  Laterality: N/A;  10:30 am, asa 3   GASTROJEJUNOSTOMY N/A 01/03/2021   Procedure: GASTROJEJUNOSTOMY;  Surgeon: Alanda Allegra, MD;  Location: [facility];  Service: General;  Laterality: N/A;   HAND SURGERY     SAVORY DILATION N/A 01/05/2020   Procedure: SAVORY DILATION;  Surgeon: Alyce Jubilee, MD;  [facility]: [facility];  Service: Endoscopy;  Laterality: N/A;  Patient Active Problem List   Diagnosis Date Noted   Seasonal allergic rhinitis due to pollen 01/17/2024   Sneezing 01/17/2024   Viral respiratory illness 01/17/2024   COVID-19 01/17/2024   SIADH (syndrome of inappropriate ADH production) (HCC) 09/07/2023   Anastomotic stricture of gastrojejunostomy 04/07/2023   PUD (peptic ulcer disease) 04/02/2023   Anemia 04/02/2023    Marginal ulcer 03/12/2023   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   Coffee ground emesis 03/11/2023   NSAID long-term use 03/11/2023   Anemia, posthemorrhagic, acute 03/11/2023   Upper GI bleeding 03/10/2023   Hypertension associated with type 2 diabetes mellitus (HCC) 01/15/2023   Constipation 10/25/2022   Upper abdominal pain 07/06/2022   Nausea and vomiting 07/06/2022   Hyponatremia 06/23/2021   Low serum vitamin B12 03/23/2021   Vitamin D deficiency 03/23/2021   S/P bypass gastrojejunostomy 01/03/2021   Gastric outlet obstruction    Duodenal stricture    Primary osteoarthritis of both hips 11/15/2020   Aortic atherosclerosis (HCC) 08/30/2020   Coronary artery calcification 08/30/2020   Bilateral carpal tunnel syndrome 06/07/2020   Grief reaction 06/07/2020   Pyloric stenosis in adult 02/05/2020   Congenital hypertrophic pyloric stenosis    Duodenal web    Barrett's esophagus without dysplasia 05/19/2019   OSA (obstructive sleep apnea) 04/30/2019   Iron  deficiency anemia 11/25/2018   Phimosis of penis 11/19/2018   Esophageal dysphagia 08/19/2018   Benign prostatic hyperplasia with post-void dribbling 01/31/2018   Absence of bladder continence 01/31/2018   Obesity (BMI 30-39.9) 11/05/2017   GERD (gastroesophageal reflux disease) 06/03/2017   Chronic pain of both knees 06/03/2017   Chronic lower back pain 06/03/2017   Type 2 diabetes mellitus without complication, without long-term current use of insulin  (HCC) 04/12/2016   Essential hypertension 04/12/2016    PCP: Lawrance Presume, MD   REFERRING PROVIDER: Shauna Del, DO  REFERRING DIAG: [phone] (ICD-10-CM) - Acquired left foot drop M48.061 (ICD-10-CM) - Spinal stenosis of lumbar region, unspecified whether neurogenic claudication present R29.898 (ICD-10-CM) - Ankle weakness  THERAPY DIAG:  Foot drop, left - Plan: PT plan of care cert/re-cert  Muscle weakness - Plan: PT plan of care cert/re-cert  Other  abnormalities of gait and mobility - Plan: PT plan of care cert/re-cert  Rationale for Evaluation and Treatment: Rehabilitation  ONSET DATE: chronic  SUBJECTIVE:   SUBJECTIVE STATEMENT: HPI: [name] is a pleasant 63 y.o. male who presents today for follow-up of chronic right knee pain status post mechanical fall.  Also having difficulty walking with reported left ankle foot drop.   Right knee -has had some issues in the past with knee exacerbated as she had 2 falls in the same week.  Fell directly onto the right knee.  He had quite a bit of pain and swelling initially.  This has gotten better with time, activity modification.  He also has been using his tramadol  medication.  His pain is much better in the knee now but is worried why he is falling.   Left ankle/gait issues - he has a history of lumbar spinal stenosis.  He also has a history of L3-L4 or L4-L5 disc herniation/protrusion.  He did have surgery back in 1992 with Dr. Cipriano Creeks.  He currently is seen at [facility].  He also uses Flexeril  twice daily.  He does have some back tightness, but having issues with reported left foot drop.  He feels like his big toe drags on the ground when he walks longer distances.  PERTINENT HISTORY: 1. Chronic pain of right knee   2. Acquired left foot drop   3. Spinal stenosis of lumbar region, unspecified whether neurogenic claudication present   4. Ankle weakness   5. Chondrocalcinosis articularis     Plan: Impression is right knee pain that was exacerbated by mechanical falls.  Has a improving small effusion on the knee joint with evidence of chondrocalcinosis.  Given this is largely improved, recommended ice/heat and compression.  He is more worried about his gait with reported evidence of foot drop.  This was more progressive and fatigable when I watched him ambulate today.  He is following up with [facility] which I would like him to continue.  I would like to send him  to formalized physical therapy to work on mobility about his lumbar spine but also with ankle dorsiflexion and strengthening.  We did discuss that if this is a true neurologic foot drop, we could consider an AFO ankle brace, but I would like to see what sort of improvements he can make with his gait with physical therapy first - recommended seeing [facility] for this.  Additional considerations may include updated MRI of the lumbar spine.  He will continue his tramadol  50 mg, 2 tablets twice daily as needed.  He also may use Flexeril  10 mg twice daily as needed.  Follow-up in 6 weeks. PAIN:  Are you having pain? No  PRECAUTIONS: Fall  RED FLAGS: None   WEIGHT BEARING RESTRICTIONS: No  FALLS:  Has patient fallen in last 6 months? Yes. Number of falls 2-3   OCCUPATION: repairman  PLOF: Independent  PATIENT GOALS: To improve my L ankle function  NEXT MD VISIT: TBD  OBJECTIVE:  Note: Objective measures were completed at Evaluation unless otherwise noted.  DIAGNOSTIC FINDINGS: none  PATIENT SURVEYS:  LEFS 22/80    MUSCLE LENGTH: Not applicable   POSTURE: No Significant postural limitations  PALPATION:   LOWER EXTREMITY ROM:  Active ROM Right eval Left eval  Hip flexion    Hip extension    Hip abduction    Hip adduction    Hip internal rotation    Hip external rotation    Knee flexion    Knee extension    Ankle dorsiflexion 8d 8d  Ankle plantarflexion    Ankle inversion    Ankle eversion     (Blank rows = not tested)  LOWER EXTREMITY MMT:  MMT Right eval Left eval  Hip flexion    Hip extension    Hip abduction    Hip adduction    Hip internal rotation    Hip external rotation    Knee flexion    Knee extension    Ankle dorsiflexion 5 4  Ankle plantarflexion    Ankle inversion    Ankle eversion     (Blank rows = not tested)  LOWER EXTREMITY SPECIAL TESTS:  Not applicable  FUNCTIONAL TESTS:  30 seconds chair stand test 8 stands SLS 15s R, 8s  L DTRs Diminished L achilles, (?) hyperactive L patellar  GAIT: Distance walked: 73ftx2 Assistive device utilized: None Level of assistance: Complete Independence Comments: unremarkable initially  TREATMENT DATE:  [name] Adult PT Treatment:                                                DATE: 03/13/24 Eval and HEP Self Care: Additional minutes spent for educating on updated Therapeutic Home Exercise Program as well as comparing current status to condition at start of symptoms. This included exercises focusing on stretching, strengthening, with focus on eccentric aspects. Long term goals include an improvement in range of motion, strength, endurance as well as avoiding reinjury. Patient's frequency would include in 1-2 times a day, 3-5 times a week for a duration of 6-12 weeks. Proper technique shown and discussed handout in great detail. All questions were discussed and addressed.       PATIENT EDUCATION:  Education details: Discussed eval findings, rehab rationale and POC and patient is in agreement  Person educated: Patient Education method: Explanation and Handouts Education comprehension: verbalized understanding and needs further education  HOME EXERCISE PROGRAM: Access Code: [email or web address] URL: [email or web address] Date: 03/13/2024 Prepared by: Gretta Leavens  Exercises - Heel Toe Raises with Counter Support  - 1-2 x daily - 5 x weekly - 2 sets - 15 reps - Single Leg Stance with Support  - 1-2 x daily - 5 x weekly - 1 sets - 3 reps - 30s hold - Single Leg Heel Raise  - 1-2 x daily - 5 x weekly - 2 sets - 10 reps  ASSESSMENT:  CLINICAL IMPRESSION: Patient is a 64 y.o. male who was seen today for physical therapy evaluation and treatment for L foot drop. Patient presents with mild strength deficits in L DF, B heelcord tightness, diminished  achilles reflex and decreased SLS time compared to R.  Recommend PT to address deficits and return to MD in 4 weeks for re-assess.  OBJECTIVE IMPAIRMENTS: Abnormal gait, decreased activity tolerance, decreased balance, decreased coordination, decreased endurance, decreased knowledge of condition, decreased mobility, difficulty walking, and decreased strength.   ACTIVITY LIMITATIONS: stairs and locomotion level  PERSONAL FACTORS: Age, Fitness, Time since onset of injury/illness/exacerbation, and 1 comorbidity: spinal stenosis are also affecting patient's functional outcome.   REHAB POTENTIAL: Fair based on chronicity and concurrent spinal stenosis    CLINICAL DECISION MAKING: Evolving/moderate complexity  EVALUATION COMPLEXITY: Moderate   GOALS: Goals reviewed with patient? No  SHORT TERM GOALS=LONG TERM GOALS: Target date: 05/13/24  Patient will score at least 30/80 on LEFS to signify clinically meaningful improvement in functional abilities.   Baseline: 22/80 Goal status: INITIAL  2.  Patient will acknowledge/maintain 0/10 pain at least once during episode of care   Baseline: 0/10 Goal status: INITIAL  3.  Patient will increase 30s chair stand reps from 8 to 10 without arms to demonstrate and improved functional ability with less pain/difficulty as well as reduce fall risk.  Baseline: 8 Goal status: INITIAL  4.  Patient to demonstrate independence in HEP  Baseline: [email or web address] Goal status: INITIAL  5.  Increase L SLS to 15s Baseline: 8s Goal status: INITIAL  6.  Increase B DF AROM to 12d Baseline: 8d Goal status: INITIAL   PLAN:  PT FREQUENCY: 1-2x/week  PT DURATION: 6 weeks  PLANNED INTERVENTIONS: 97110-Therapeutic exercises, 97530- Therapeutic activity, [id number]- Neuromuscular re-education, 97535- Self Care, [id number]- Manual therapy, [id number]- Gait training, [phone]- Splinting, [phone]- Electrical stimulation (manual), Patient/Family education, Balance training, and Stair  training  PLAN FOR NEXT SESSION: HEP review and update, manual techniques as appropriate, aerobic tasks, ROM and flexibility activities, strengthening and PREs, TPDN, gait and balance training as needed    For all possible CPT codes, reference the Planned Interventions line above.     Check all conditions that are expected to impact treatment: {Conditions expected to impact treatment:Diabetes mellitus and Complications related to surgery   If treatment provided at initial evaluation, no treatment charged due to lack of authorization.       Yordy Matton M Tagan Bartram, PT 03/13/2024, 5:56 PM

## 2024-03-17 DIAGNOSIS — M47816 Spondylosis without myelopathy or radiculopathy, lumbar region: Secondary | ICD-10-CM | POA: Diagnosis not present

## 2024-03-18 ENCOUNTER — Inpatient Hospital Stay: Admitting: Internal Medicine

## 2024-03-18 ENCOUNTER — Inpatient Hospital Stay: Attending: Internal Medicine

## 2024-03-19 ENCOUNTER — Encounter: Payer: Self-pay | Admitting: Physical Therapy

## 2024-03-19 ENCOUNTER — Ambulatory Visit: Attending: Sports Medicine | Admitting: Physical Therapy

## 2024-03-19 DIAGNOSIS — M5459 Other low back pain: Secondary | ICD-10-CM | POA: Diagnosis not present

## 2024-03-19 DIAGNOSIS — R2689 Other abnormalities of gait and mobility: Secondary | ICD-10-CM | POA: Diagnosis not present

## 2024-03-19 DIAGNOSIS — M21372 Foot drop, left foot: Secondary | ICD-10-CM | POA: Diagnosis not present

## 2024-03-19 DIAGNOSIS — M6281 Muscle weakness (generalized): Secondary | ICD-10-CM | POA: Diagnosis not present

## 2024-03-19 NOTE — Therapy (Signed)
 OUTPATIENT PHYSICAL THERAPY LOWER EXTREMITY EVALUATION   Patient Name: Jeremy [name] MRN: [id number] DOB:1960/10/22, 63 y.o., male Today's Date: 03/19/2024  END OF SESSION:  PT End of Session - 03/19/24 1543     Visit Number 2    Number of Visits 4    Date for PT Re-Evaluation 05/13/24    Authorization Type MCD    PT Start Time 1542    PT Stop Time 1612    PT Time Calculation (min) 30 min    Activity Tolerance Patient tolerated treatment well    Behavior During Therapy [facility] for tasks assessed/performed             Past Medical History:  Diagnosis Date   Diabetes mellitus without complication (HCC)    GERD (gastroesophageal reflux disease)    Heart murmur    Hyperlipidemia    Hypertension    IDA (iron  deficiency anemia)    Rash of entire body 03/2016   Sleep apnea    Past Surgical History:  Procedure Laterality Date   BACK SURGERY  1993   lumbar   BALLOON DILATION N/A 04/25/2023   Procedure: BALLOON DILATION;  Surgeon: Vinetta Greening, DO;  [facility];  Service: Endoscopy;  Laterality: N/A;   BIOPSY  02/07/2019   Procedure: BIOPSY;  Surgeon: Alyce Jubilee, MD;  [facility]: [facility];  Service: Endoscopy;;   BIOPSY  01/30/2022   Procedure: BIOPSY;  Surgeon: Vinetta Greening, DO;  [facility]: [facility];  Service: Endoscopy;;   BIOPSY  04/25/2023   Procedure: BIOPSY;  Surgeon: Vinetta Greening, DO;  [facility];  Service: Endoscopy;;   CERVICAL SPINE SURGERY  2000   COLONOSCOPY N/A 02/07/2019   Dr. Nolene Baumgarten: External hemorrhoids next colonoscopy in 10 years   ESOPHAGOGASTRODUODENOSCOPY N/A 02/07/2019   Dr. Nolene Baumgarten: Barrett's esophagus without dysplasia chronic inactive gastritis but no H. 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, nonbleeding duodenal diverticulum,   ESOPHAGOGASTRODUODENOSCOPY N/A 01/05/2020   Procedure: ESOPHAGOGASTRODUODENOSCOPY (EGD);  Surgeon: Alyce Jubilee, MD;   [facility];  Service: Endoscopy;  Laterality: N/A;  10:30am   ESOPHAGOGASTRODUODENOSCOPY (EGD) WITH PROPOFOL  N/A 12/06/2020   Procedure: ESOPHAGOGASTRODUODENOSCOPY (EGD) WITH PROPOFOL ;  Surgeon: Vinetta Greening, DO;  [facility];  Service: Endoscopy;  Laterality: N/A;  11:45am   ESOPHAGOGASTRODUODENOSCOPY (EGD) WITH PROPOFOL  N/A 12/22/2021   long-segment Barrett's s/p biopsy, gastric bypass with normal sized puch and intact staple line. GJ anastomosis with healthy mucosa. Gastritis.   ESOPHAGOGASTRODUODENOSCOPY (EGD) WITH PROPOFOL  N/A 01/30/2022   Procedure: ESOPHAGOGASTRODUODENOSCOPY (EGD) WITH PROPOFOL ;  Surgeon: Vinetta Greening, DO;  [facility];  Service: Endoscopy;  Laterality: N/A;  11:00am   ESOPHAGOGASTRODUODENOSCOPY (EGD) WITH PROPOFOL  N/A 03/11/2023   Procedure: ESOPHAGOGASTRODUODENOSCOPY (EGD) WITH PROPOFOL ;  Surgeon: Ace Holder, MD;  Location: [facility];  Service: Gastroenterology;  Laterality: N/A;   ESOPHAGOGASTRODUODENOSCOPY (EGD) WITH PROPOFOL  N/A 04/25/2023   Procedure: ESOPHAGOGASTRODUODENOSCOPY (EGD) WITH PROPOFOL ;  Surgeon: Vinetta Greening, DO;  [facility];  Service: Endoscopy;  Laterality: N/A;  10:30 am, asa 3   GASTROJEJUNOSTOMY N/A 01/03/2021   Procedure: GASTROJEJUNOSTOMY;  Surgeon: Alanda Allegra, MD;  Location: [facility];  Service: General;  Laterality: N/A;   HAND SURGERY     SAVORY DILATION N/A 01/05/2020   Procedure: SAVORY DILATION;  Surgeon: Alyce Jubilee, MD;  [facility]: [facility];  Service: Endoscopy;  Laterality: N/A;  Patient Active Problem List   Diagnosis Date Noted   Seasonal allergic rhinitis due to pollen 01/17/2024   Sneezing 01/17/2024   Viral respiratory illness 01/17/2024   COVID-19 01/17/2024   SIADH (syndrome of inappropriate ADH production) (HCC) 09/07/2023   Anastomotic stricture of gastrojejunostomy 04/07/2023   PUD (peptic ulcer disease) 04/02/2023   Anemia 04/02/2023    Marginal ulcer 03/12/2023   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   Coffee ground emesis 03/11/2023   NSAID long-term use 03/11/2023   Anemia, posthemorrhagic, acute 03/11/2023   Upper GI bleeding 03/10/2023   Hypertension associated with type 2 diabetes mellitus (HCC) 01/15/2023   Constipation 10/25/2022   Upper abdominal pain 07/06/2022   Nausea and vomiting 07/06/2022   Hyponatremia 06/23/2021   Low serum vitamin B12 03/23/2021   Vitamin D deficiency 03/23/2021   S/P bypass gastrojejunostomy 01/03/2021   Gastric outlet obstruction    Duodenal stricture    Primary osteoarthritis of both hips 11/15/2020   Aortic atherosclerosis (HCC) 08/30/2020   Coronary artery calcification 08/30/2020   Bilateral carpal tunnel syndrome 06/07/2020   Grief reaction 06/07/2020   Pyloric stenosis in adult 02/05/2020   Congenital hypertrophic pyloric stenosis    Duodenal web    Barrett's esophagus without dysplasia 05/19/2019   OSA (obstructive sleep apnea) 04/30/2019   Iron  deficiency anemia 11/25/2018   Phimosis of penis 11/19/2018   Esophageal dysphagia 08/19/2018   Benign prostatic hyperplasia with post-void dribbling 01/31/2018   Absence of bladder continence 01/31/2018   Obesity (BMI 30-39.9) 11/05/2017   GERD (gastroesophageal reflux disease) 06/03/2017   Chronic pain of both knees 06/03/2017   Chronic lower back pain 06/03/2017   Type 2 diabetes mellitus without complication, without long-term current use of insulin  (HCC) 04/12/2016   Essential hypertension 04/12/2016    PCP: Lawrance Presume, MD   REFERRING PROVIDER: Shauna Del, DO  REFERRING DIAG: [phone] (ICD-10-CM) - Acquired left foot drop M48.061 (ICD-10-CM) - Spinal stenosis of lumbar region, unspecified whether neurogenic claudication present R29.898 (ICD-10-CM) - Ankle weakness  THERAPY DIAG:  Foot drop, left  Other abnormalities of gait and mobility  Muscle weakness  Other low back pain  Rationale for  Evaluation and Treatment: Rehabilitation  ONSET DATE: chronic  SUBJECTIVE:   SUBJECTIVE STATEMENT: Pt attended today's session with reports of pressure in the L knee. Pt stated that they have maintained fair compliance with current HEP.  Stated that standing on one foot is the hard part of his exercises.   HPI: BOYKIN BAETZ is a pleasant 63 y.o. male who presents today for follow-up of chronic right knee pain status post mechanical fall.  Also having difficulty walking with reported left ankle foot drop.   Right knee -has had some issues in the past with knee exacerbated as she had 2 falls in the same week.  Fell directly onto the right knee.  He had quite a bit of pain and swelling initially.  This has gotten better with time, activity modification.  He also has been using his tramadol  medication.  His pain is much better in the knee now but is worried why he is falling.   Left ankle/gait issues - he has a history of lumbar spinal stenosis.  He also has a history of L3-L4 or L4-L5 disc herniation/protrusion.  He did have surgery back in 1992 with Dr. Cipriano Creeks.  He currently is seen at [facility].  He also uses Flexeril  twice daily.  He does have some back tightness, but  having issues with reported left foot drop.  He feels like his big toe drags on the ground when he walks longer distances.  PERTINENT HISTORY: 1. Chronic pain of right knee   2. Acquired left foot drop   3. Spinal stenosis of lumbar region, unspecified whether neurogenic claudication present   4. Ankle weakness   5. Chondrocalcinosis articularis     Plan: Impression is right knee pain that was exacerbated by mechanical falls.  Has a improving small effusion on the knee joint with evidence of chondrocalcinosis.  Given this is largely improved, recommended ice/heat and compression.  He is more worried about his gait with reported evidence of foot drop.  This was more progressive and fatigable when I watched him  ambulate today.  He is following up with [facility] which I would like him to continue.  I would like to send him to formalized physical therapy to work on mobility about his lumbar spine but also with ankle dorsiflexion and strengthening.  We did discuss that if this is a true neurologic foot drop, we could consider an AFO ankle brace, but I would like to see what sort of improvements he can make with his gait with physical therapy first - recommended seeing [facility] for this.  Additional considerations may include updated MRI of the lumbar spine.  He will continue his tramadol  50 mg, 2 tablets twice daily as needed.  He also may use Flexeril  10 mg twice daily as needed.  Follow-up in 6 weeks. PAIN:  Are you having pain? No  PRECAUTIONS: Fall  RED FLAGS: None   WEIGHT BEARING RESTRICTIONS: No  FALLS:  Has patient fallen in last 6 months? Yes. Number of falls 2-3   OCCUPATION: repairman  PLOF: Independent  PATIENT GOALS: To improve my L ankle function  NEXT MD VISIT: TBD  OBJECTIVE:  Note: Objective measures were completed at Evaluation unless otherwise noted.  DIAGNOSTIC FINDINGS: none  PATIENT SURVEYS:  LEFS 22/80    MUSCLE LENGTH: Not applicable   POSTURE: No Significant postural limitations  PALPATION:   LOWER EXTREMITY ROM:  Active ROM Right eval Left eval  Hip flexion    Hip extension    Hip abduction    Hip adduction    Hip internal rotation    Hip external rotation    Knee flexion    Knee extension    Ankle dorsiflexion 8d 8d  Ankle plantarflexion    Ankle inversion    Ankle eversion     (Blank rows = not tested)  LOWER EXTREMITY MMT:  MMT Right eval Left eval  Hip flexion    Hip extension    Hip abduction    Hip adduction    Hip internal rotation    Hip external rotation    Knee flexion    Knee extension    Ankle dorsiflexion 5 4  Ankle plantarflexion    Ankle inversion    Ankle eversion     (Blank rows = not  tested)  LOWER EXTREMITY SPECIAL TESTS:  Not applicable  FUNCTIONAL TESTS:  30 seconds chair stand test 8 stands SLS 15s R, 8s L DTRs Diminished L achilles, (?) hyperactive L patellar  GAIT: Distance walked: 9ftx2 Assistive device utilized: None Level of assistance: Complete Independence Comments: unremarkable initially  TREATMENT: [facility] Adult PT Treatment:                                                DATE: 03/19/2024 Therapeutic Activity: NuStep 5' for activity tolerance Slant board gastroc stretch 2x1' Standing toe raise 2x15, hold 3s  Standing march w/YTB 2x10, hold 2s, UE support PRN (single fingertip assist on 2nd set)   [facility] Adult PT Treatment:                                                DATE: 03/13/24 Eval and HEP Self Care: Additional minutes spent for educating on updated Therapeutic Home Exercise Program as well as comparing current status to condition at start of symptoms. This included exercises focusing on stretching, strengthening, with focus on eccentric aspects. Long term goals include an improvement in range of motion, strength, endurance as well as avoiding reinjury. Patient's frequency would include in 1-2 times a day, 3-5 times a week for a duration of 6-12 weeks. Proper technique shown and discussed handout in great detail. All questions were discussed and addressed.       PATIENT EDUCATION:  Education details: Discussed eval findings, rehab rationale and POC and patient is in agreement  Person educated: Patient Education method: Explanation and Handouts Education comprehension: verbalized understanding and needs further education  HOME EXERCISE PROGRAM: Access Code: [email or web address] URL: [email or web address] Date: 03/13/2024 Prepared by: Gretta Leavens  Exercises - Heel Toe Raises with Counter Support  - 1-2 x  daily - 5 x weekly - 2 sets - 15 reps - Single Leg Stance with Support  - 1-2 x daily - 5 x weekly - 1 sets - 3 reps - 30s hold - Single Leg Heel Raise  - 1-2 x daily - 5 x weekly - 2 sets - 10 reps  ASSESSMENT:  CLINICAL IMPRESSION: Pt attended physical therapy session 13 minutes late for continuation of treatment regarding L foot drop. Today's treatment focused on improvement of  achilles complex motility, SLS time on LLE, and strength with DF. Pt showed  great tolerance to administered treatment with no adverse effects by the end of session. Skilled intervention was utilized via activity modification for pt tolerance with task completion, functional progression/regression promoting best outcomes inline with current rehab goals, as well as minimal verbal/tactile cuing alongside no physical assistance for safe and appropriate performance of today's activities.    Patient is a 63 y.o. male who was seen today for physical therapy evaluation and treatment for L foot drop. Patient presents with mild strength deficits in L DF, B heelcord tightness, diminished achilles reflex and decreased SLS time compared to R.  Recommend PT to address deficits and return to MD in 4 weeks for re-assess.  OBJECTIVE IMPAIRMENTS: Abnormal gait, decreased activity tolerance, decreased balance, decreased coordination, decreased endurance, decreased knowledge of condition, decreased mobility, difficulty walking, and decreased strength.   ACTIVITY LIMITATIONS: stairs and locomotion level  PERSONAL FACTORS: Age, Fitness, Time since onset of injury/illness/exacerbation, and 1 comorbidity: spinal stenosis are also affecting patient's functional outcome.   REHAB POTENTIAL: Fair based on chronicity and concurrent spinal stenosis    CLINICAL DECISION MAKING: Evolving/moderate complexity  EVALUATION COMPLEXITY: Moderate   GOALS: Goals reviewed with patient?  No  SHORT TERM GOALS=LONG TERM GOALS: Target date:  05/13/24  Patient will score at least 30/80 on LEFS to signify clinically meaningful improvement in functional abilities.   Baseline: 22/80 Goal status: INITIAL  2.  Patient will acknowledge/maintain 0/10 pain at least once during episode of care   Baseline: 0/10 Goal status: INITIAL  3.  Patient will increase 30s chair stand reps from 8 to 10 without arms to demonstrate and improved functional ability with less pain/difficulty as well as reduce fall risk.  Baseline: 8 Goal status: INITIAL  4.  Patient to demonstrate independence in HEP  Baseline: [email or web address] Goal status: INITIAL  5.  Increase L SLS to 15s Baseline: 8s Goal status: INITIAL  6.  Increase B DF AROM to 12d Baseline: 8d Goal status: INITIAL   PLAN:  PT FREQUENCY: 1-2x/week  PT DURATION: 6 weeks  PLANNED INTERVENTIONS: 97110-Therapeutic exercises, 97530- Therapeutic activity, [id number]- Neuromuscular re-education, 97535- Self Care, [id number]- Manual therapy, [id number]- Gait training, [phone]- Splinting, [phone]- Electrical stimulation (manual), Patient/Family education, Balance training, and Stair training  PLAN FOR NEXT SESSION: HEP review and update, manual techniques as appropriate, aerobic tasks, ROM and flexibility activities, strengthening and PREs, TPDN, gait and balance training as needed    For all possible CPT codes, reference the Planned Interventions line above.     Check all conditions that are expected to impact treatment: {Conditions expected to impact treatment:Diabetes mellitus and Complications related to surgery   If treatment provided at initial evaluation, no treatment charged due to lack of authorization.       Bunny Caroli, PT 03/19/2024, 4:12 PM

## 2024-03-24 ENCOUNTER — Ambulatory Visit: Admitting: Physical Therapy

## 2024-03-24 ENCOUNTER — Other Ambulatory Visit: Payer: Self-pay

## 2024-03-24 ENCOUNTER — Encounter: Payer: Self-pay | Admitting: Physician Assistant

## 2024-03-24 DIAGNOSIS — M21372 Foot drop, left foot: Secondary | ICD-10-CM

## 2024-03-24 DIAGNOSIS — R2689 Other abnormalities of gait and mobility: Secondary | ICD-10-CM

## 2024-03-24 DIAGNOSIS — M5459 Other low back pain: Secondary | ICD-10-CM | POA: Diagnosis not present

## 2024-03-24 DIAGNOSIS — M6281 Muscle weakness (generalized): Secondary | ICD-10-CM | POA: Diagnosis not present

## 2024-03-24 NOTE — Therapy (Signed)
 OUTPATIENT PHYSICAL THERAPY LOWER EXTREMITY EVALUATION   Patient Name: Jeremy [name] MRN: [id number] DOB:[birth year], 63 y.o., male Today's Date: 03/24/2024  END OF SESSION:  PT End of Session - 03/24/24 1653     Visit Number 3    Number of Visits 4    Date for PT Re-Evaluation 05/13/24    Authorization Type MCD    PT Start Time 1615    PT Stop Time 1655    PT Time Calculation (min) 40 min              Past Medical History:  Diagnosis Date   Diabetes mellitus without complication (HCC)    GERD (gastroesophageal reflux disease)    Heart murmur    Hyperlipidemia    Hypertension    IDA (iron  deficiency anemia)    Rash of entire body 03/2016   Sleep apnea    Past Surgical History:  Procedure Laterality Date   BACK SURGERY  1993   lumbar   BALLOON DILATION N/A 04/25/2023   Procedure: BALLOON DILATION;  Surgeon: Vinetta Greening, DO;  Location: [facility];  Service: Endoscopy;  Laterality: N/A;   BIOPSY  02/07/2019   Procedure: BIOPSY;  Surgeon: Alyce Jubilee, MD;  Location: [facility];  Service: Endoscopy;;   BIOPSY  01/30/2022   Procedure: BIOPSY;  Surgeon: Vinetta Greening, DO;  [facility]: [facility];  Service: Endoscopy;;   BIOPSY  04/25/2023   Procedure: BIOPSY;  Surgeon: Vinetta Greening, DO;  [facility]: [facility];  Service: Endoscopy;;   CERVICAL SPINE SURGERY  2000   COLONOSCOPY N/A 02/07/2019   Dr. Nolene Baumgarten: External hemorrhoids next colonoscopy in 10 years   ESOPHAGOGASTRODUODENOSCOPY N/A 02/07/2019   Dr. Nolene Baumgarten: Barrett's esophagus without dysplasia chronic inactive gastritis but no H. 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, nonbleeding duodenal diverticulum,   ESOPHAGOGASTRODUODENOSCOPY N/A 01/05/2020   Procedure: ESOPHAGOGASTRODUODENOSCOPY (EGD);  Surgeon: Alyce Jubilee, MD;  Location: [facility];  Service: Endoscopy;  Laterality: N/A;  10:30am   ESOPHAGOGASTRODUODENOSCOPY (EGD) WITH  PROPOFOL  N/A 12/06/2020   Procedure: ESOPHAGOGASTRODUODENOSCOPY (EGD) WITH PROPOFOL ;  Surgeon: Vinetta Greening, DO;  [facility]: [facility];  Service: Endoscopy;  Laterality: N/A;  11:45am   ESOPHAGOGASTRODUODENOSCOPY (EGD) WITH PROPOFOL  N/A 12/22/2021   long-segment Barrett's s/p biopsy, gastric bypass with normal sized puch and intact staple line. GJ anastomosis with healthy mucosa. Gastritis.   ESOPHAGOGASTRODUODENOSCOPY (EGD) WITH PROPOFOL  N/A 01/30/2022   Procedure: ESOPHAGOGASTRODUODENOSCOPY (EGD) WITH PROPOFOL ;  Surgeon: Vinetta Greening, DO;  [facility];  Service: Endoscopy;  Laterality: N/A;  11:00am   ESOPHAGOGASTRODUODENOSCOPY (EGD) WITH PROPOFOL  N/A 03/11/2023   Procedure: ESOPHAGOGASTRODUODENOSCOPY (EGD) WITH PROPOFOL ;  Surgeon: Ace Holder, MD;  Location[facility];  Service: Gastroenterology;  Laterality: N/A;   ESOPHAGOGASTRODUODENOSCOPY (EGD) WITH PROPOFOL  N/A 04/25/2023   Procedure: ESOPHAGOGASTRODUODENOSCOPY (EGD) WITH PROPOFOL ;  Surgeon: Vinetta Greening, DO;  [facility];  Service: Endoscopy;  Laterality: N/A;  10:30 am, asa 3   GASTROJEJUNOSTOMY N/A 01/03/2021   Procedure: GASTROJEJUNOSTOMY;  Surgeon: Alanda Allegra, MD;  Location: [facility];  Service: General;  Laterality: N/A;   HAND SURGERY     SAVORY DILATION N/A 01/05/2020   Procedure: SAVORY DILATION;  Surgeon: Alyce Jubilee, MD;  [facility]: [facility];  Service: Endoscopy;  Laterality: N/A;   Patient Active Problem List   Diagnosis Date Noted   Seasonal allergic rhinitis due to pollen 01/17/2024  Sneezing 01/17/2024   Viral respiratory illness 01/17/2024   COVID-19 01/17/2024   SIADH (syndrome of inappropriate ADH production) (HCC) 09/07/2023   Anastomotic stricture of gastrojejunostomy 04/07/2023   PUD (peptic ulcer disease) 04/02/2023   Anemia 04/02/2023   Marginal ulcer 03/12/2023   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   Coffee ground emesis 03/11/2023   NSAID  long-term use 03/11/2023   Anemia, posthemorrhagic, acute 03/11/2023   Upper GI bleeding 03/10/2023   Hypertension associated with type 2 diabetes mellitus (HCC) 01/15/2023   Constipation 10/25/2022   Upper abdominal pain 07/06/2022   Nausea and vomiting 07/06/2022   Hyponatremia 06/23/2021   Low serum vitamin B12 03/23/2021   Vitamin D deficiency 03/23/2021   S/P bypass gastrojejunostomy 01/03/2021   Gastric outlet obstruction    Duodenal stricture    Primary osteoarthritis of both hips 11/15/2020   Aortic atherosclerosis (HCC) 08/30/2020   Coronary artery calcification 08/30/2020   Bilateral carpal tunnel syndrome 06/07/2020   Grief reaction 06/07/2020   Pyloric stenosis in adult 02/05/2020   Congenital hypertrophic pyloric stenosis    Duodenal web    Barrett's esophagus without dysplasia 05/19/2019   OSA (obstructive sleep apnea) 04/30/2019   Iron  deficiency anemia 11/25/2018   Phimosis of penis 11/19/2018   Esophageal dysphagia 08/19/2018   Benign prostatic hyperplasia with post-void dribbling 01/31/2018   Absence of bladder continence 01/31/2018   Obesity (BMI 30-39.9) 11/05/2017   GERD (gastroesophageal reflux disease) 06/03/2017   Chronic pain of both knees 06/03/2017   Chronic lower back pain 06/03/2017   Type 2 diabetes mellitus without complication, without long-term current use of insulin  (HCC) 04/12/2016   Essential hypertension 04/12/2016    PCP: Lawrance Presume, MD   REFERRING PROVIDER: Shauna Del, DO  REFERRING DIAG: [phone] (ICD-10-CM) - Acquired left foot drop M48.061 (ICD-10-CM) - Spinal stenosis of lumbar region, unspecified whether neurogenic claudication present R29.898 (ICD-10-CM) - Ankle weakness  THERAPY DIAG:  Foot drop, left  Other abnormalities of gait and mobility  Muscle weakness  Rationale for Evaluation and Treatment: Rehabilitation  ONSET DATE: chronic  SUBJECTIVE:   SUBJECTIVE STATEMENT: Pt attended today's session with  reports of minimal pain and stiffness following last session Pt stated that they have maintained good compliance with current HEP.  Overall feels that he can get off the floor easier, foot drop has improved a little bit and can stand on his L side for longer.    HPI: HOBART MARTE is a pleasant 63 y.o. male who presents today for follow-up of chronic right knee pain status post mechanical fall.  Also having difficulty walking with reported left ankle foot drop.   Right knee -has had some issues in the past with knee exacerbated as she had 2 falls in the same week.  Fell directly onto the right knee.  He had quite a bit of pain and swelling initially.  This has gotten better with time, activity modification.  He also has been using his tramadol  medication.  His pain is much better in the knee now but is worried why he is falling.   Left ankle/gait issues - he has a history of lumbar spinal stenosis.  He also has a history of L3-L4 or L4-L5 disc herniation/protrusion.  He did have surgery back in 1992 with Dr. Cipriano Creeks.  He currently is seen at [facility].  He also uses Flexeril  twice daily.  He does have some back tightness, but having issues with reported left foot drop.  He  feels like his big toe drags on the ground when he walks longer distances.  PERTINENT HISTORY: 1. Chronic pain of right knee   2. Acquired left foot drop   3. Spinal stenosis of lumbar region, unspecified whether neurogenic claudication present   4. Ankle weakness   5. Chondrocalcinosis articularis     Plan: Impression is right knee pain that was exacerbated by mechanical falls.  Has a improving small effusion on the knee joint with evidence of chondrocalcinosis.  Given this is largely improved, recommended ice/heat and compression.  He is more worried about his gait with reported evidence of foot drop.  This was more progressive and fatigable when I watched him ambulate today.  He is following up with [facility] which I would like him to continue.  I would like to send him to formalized physical therapy to work on mobility about his lumbar spine but also with ankle dorsiflexion and strengthening.  We did discuss that if this is a true neurologic foot drop, we could consider an AFO ankle brace, but I would like to see what sort of improvements he can make with his gait with physical therapy first - recommended seeing [facility] for this.  Additional considerations may include updated MRI of the lumbar spine.  He will continue his tramadol  50 mg, 2 tablets twice daily as needed.  He also may use Flexeril  10 mg twice daily as needed.  Follow-up in 6 weeks. PAIN:  Are you having pain? No  PRECAUTIONS: Fall  RED FLAGS: None   WEIGHT BEARING RESTRICTIONS: No  FALLS:  Has patient fallen in last 6 months? Yes. Number of falls 2-3   OCCUPATION: repairman  PLOF: Independent  PATIENT GOALS: To improve my L ankle function  NEXT MD VISIT: TBD  OBJECTIVE:  Note: Objective measures were completed at Evaluation unless otherwise noted.  DIAGNOSTIC FINDINGS: none  PATIENT SURVEYS:  LEFS 22/80    MUSCLE LENGTH: Not applicable   POSTURE: No Significant postural limitations  PALPATION:   LOWER EXTREMITY ROM:  Active ROM Right eval Left eval  Hip flexion    Hip extension    Hip abduction    Hip adduction    Hip internal rotation    Hip external rotation    Knee flexion    Knee extension    Ankle dorsiflexion 8d 8d  Ankle plantarflexion    Ankle inversion    Ankle eversion     (Blank rows = not tested)  LOWER EXTREMITY MMT:  MMT Right eval Left eval  Hip flexion    Hip extension    Hip abduction    Hip adduction    Hip internal rotation    Hip external rotation    Knee flexion    Knee extension    Ankle dorsiflexion 5 4  Ankle plantarflexion    Ankle inversion    Ankle eversion     (Blank rows = not tested)  LOWER EXTREMITY SPECIAL TESTS:  Not  applicable  FUNCTIONAL TESTS:  30 seconds chair stand test 8 stands SLS 15s R, 8s L DTRs Diminished L achilles, (?) hyperactive L patellar  GAIT: Distance walked: 52ftx2 Assistive device utilized: None Level of assistance: Complete Independence Comments: unremarkable initially  TREATMENT: [facility] Adult PT Treatment:                                                DATE: 03/24/2024 Neuromuscular re-ed: SLS on compliant surface 2x2' LLE, UE support PRN Standing marches with YTB to induce DF 2x12 Standing slant board gastroc stretch 2x1' Rocker board a/p, eccentric control into DF 2x15, UE support PRN, ensure feet are as forward as possible on board Therapeutic Activity: NuStep 8' for activity tolerance   [facility] Adult PT Treatment:                                                DATE: 03/19/2024 Therapeutic Activity: NuStep 5' for activity tolerance Slant board gastroc stretch 2x1' Standing toe raise 2x15, hold 3s  Standing march w/YTB 2x10, hold 2s, UE support PRN (single fingertip assist on 2nd set)   [facility] Adult PT Treatment:                                                DATE: 03/13/24 Eval and HEP Self Care: Additional minutes spent for educating on updated Therapeutic Home Exercise Program as well as comparing current status to condition at start of symptoms. This included exercises focusing on stretching, strengthening, with focus on eccentric aspects. Long term goals include an improvement in range of motion, strength, endurance as well as avoiding reinjury. Patient's frequency would include in 1-2 times a day, 3-5 times a week for a duration of 6-12 weeks. Proper technique shown and discussed handout in great detail. All questions were discussed and addressed.       PATIENT EDUCATION:  Education details: Discussed eval findings, rehab rationale and  POC and patient is in agreement  Person educated: Patient Education method: Explanation and Handouts Education comprehension: verbalized understanding and needs further education  HOME EXERCISE PROGRAM: Access Code: [email or web address] URL: [email or web address] Date: 03/13/2024 Prepared by: Gretta Leavens  Exercises - Heel Toe Raises with Counter Support  - 1-2 x daily - 5 x weekly - 2 sets - 15 reps - Single Leg Stance with Support  - 1-2 x daily - 5 x weekly - 1 sets - 3 reps - 30s hold - Single Leg Heel Raise  - 1-2 x daily - 5 x weekly - 2 sets - 10 reps  ASSESSMENT:  CLINICAL IMPRESSION: Pt attended physical therapy session for continuation of treatment regarding L foot drop. Today's treatment focused on improvement of  achilles complex motility, SLS time on LLE, and strength with DF. Pt showed  great tolerance to administered treatment with no adverse effects by the end of session. Skilled intervention was utilized via activity modification for pt tolerance with task completion, functional progression/regression promoting best outcomes inline with current rehab goals, as well as minimal verbal/tactile cuing alongside no physical assistance for safe and appropriate performance of today's activities.    Patient is a 63 y.o. male who was seen today for physical therapy evaluation and treatment for L foot drop. Patient presents with mild strength deficits in L DF, B heelcord tightness, diminished achilles reflex and decreased SLS  time compared to R.  Recommend PT to address deficits and return to MD in 4 weeks for re-assess.  OBJECTIVE IMPAIRMENTS: Abnormal gait, decreased activity tolerance, decreased balance, decreased coordination, decreased endurance, decreased knowledge of condition, decreased mobility, difficulty walking, and decreased strength.   ACTIVITY LIMITATIONS: stairs and locomotion level  PERSONAL FACTORS: Age, Fitness, Time since onset of injury/illness/exacerbation,  and 1 comorbidity: spinal stenosis are also affecting patient's functional outcome.   REHAB POTENTIAL: Fair based on chronicity and concurrent spinal stenosis    CLINICAL DECISION MAKING: Evolving/moderate complexity  EVALUATION COMPLEXITY: Moderate   GOALS: Goals reviewed with patient? No  SHORT TERM GOALS=LONG TERM GOALS: Target date: 05/13/24  Patient will score at least 30/80 on LEFS to signify clinically meaningful improvement in functional abilities.   Baseline: 22/80 Goal status: INITIAL  2.  Patient will acknowledge/maintain 0/10 pain at least once during episode of care   Baseline: 0/10 Goal status: INITIAL  3.  Patient will increase 30s chair stand reps from 8 to 10 without arms to demonstrate and improved functional ability with less pain/difficulty as well as reduce fall risk.  Baseline: 8 Goal status: INITIAL  4.  Patient to demonstrate independence in HEP  Baseline: [id number] Goal status: INITIAL  5.  Increase L SLS to 15s Baseline: 8s Goal status: INITIAL  6.  Increase B DF AROM to 12d Baseline: 8d Goal status: INITIAL   PLAN:  PT FREQUENCY: 1-2x/week  PT DURATION: 6 weeks  PLANNED INTERVENTIONS: 97110-Therapeutic exercises, 97530- Therapeutic activity, [id number]- Neuromuscular re-education, 97535- Self Care, 16109- Manual therapy, [id number]- Gait training, [phone]- Splinting, [phone]- Electrical stimulation (manual), Patient/Family education, Balance training, and Stair training  PLAN FOR NEXT SESSION: HEP review and update, manual techniques as appropriate, aerobic tasks, ROM and flexibility activities, strengthening and PREs, TPDN, gait and balance training as needed    For all possible CPT codes, reference the Planned Interventions line above.     Check all conditions that are expected to impact treatment: {Conditions expected to impact treatment:Diabetes mellitus and Complications related to surgery   If treatment provided at initial evaluation, no treatment  charged due to lack of authorization.       Bunny Caroli, PT 03/24/2024, 4:55 PM

## 2024-03-29 ENCOUNTER — Encounter: Payer: Self-pay | Admitting: Internal Medicine

## 2024-03-31 ENCOUNTER — Ambulatory Visit

## 2024-03-31 ENCOUNTER — Telehealth: Payer: Self-pay

## 2024-03-31 NOTE — Therapy (Deleted)
 OUTPATIENT PHYSICAL THERAPY LOWER EXTREMITY EVALUATION   Patient Name: Jeremy [name] MRN: [id number] DOB:1961/09/10, 63 y.o., male Today's Date: 03/31/2024  END OF SESSION:     Past Medical History:  Diagnosis Date   Diabetes mellitus without complication (HCC)    GERD (gastroesophageal reflux disease)    Heart murmur    Hyperlipidemia    Hypertension    IDA (iron  deficiency anemia)    Rash of entire body 03/2016   Sleep apnea    Past Surgical History:  Procedure Laterality Date   BACK SURGERY  1993   lumbar   BALLOON DILATION N/A 04/25/2023   Procedure: BALLOON DILATION;  Surgeon: Vinetta Greening, DO;  Location: [facility];  Service: Endoscopy;  Laterality: N/A;   BIOPSY  02/07/2019   Procedure: BIOPSY;  Surgeon: Alyce Jubilee, MD;  Location: [facility];  Service: Endoscopy;;   BIOPSY  01/30/2022   Procedure: BIOPSY;  Surgeon: Vinetta Greening, DO;  [facility]: [facility];  Service: Endoscopy;;   BIOPSY  04/25/2023   Procedure: BIOPSY;  Surgeon: Vinetta Greening, DO;  [facility]: [facility];  Service: Endoscopy;;   CERVICAL SPINE SURGERY  2000   COLONOSCOPY N/A 02/07/2019   Dr. Nolene Baumgarten: External hemorrhoids next colonoscopy in 10 years   ESOPHAGOGASTRODUODENOSCOPY N/A 02/07/2019   Dr. Nolene Baumgarten: Barrett's esophagus without dysplasia chronic inactive gastritis but no H. 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, nonbleeding duodenal diverticulum,   ESOPHAGOGASTRODUODENOSCOPY N/A 01/05/2020   Procedure: ESOPHAGOGASTRODUODENOSCOPY (EGD);  Surgeon: Alyce Jubilee, MD;  [facility]: [facility];  Service: Endoscopy;  Laterality: N/A;  10:30am   ESOPHAGOGASTRODUODENOSCOPY (EGD) WITH PROPOFOL  N/A 12/06/2020   Procedure: ESOPHAGOGASTRODUODENOSCOPY (EGD) WITH PROPOFOL ;  Surgeon: Vinetta Greening, DO;  [facility]: [facility];  Service: Endoscopy;  Laterality: N/A;  11:45am   ESOPHAGOGASTRODUODENOSCOPY (EGD) WITH  PROPOFOL  N/A 12/22/2021   long-segment Barrett's s/p biopsy, gastric bypass with normal sized puch and intact staple line. GJ anastomosis with healthy mucosa. Gastritis.   ESOPHAGOGASTRODUODENOSCOPY (EGD) WITH PROPOFOL  N/A 01/30/2022   Procedure: ESOPHAGOGASTRODUODENOSCOPY (EGD) WITH PROPOFOL ;  Surgeon: Vinetta Greening, DO;  [facility];  Service: Endoscopy;  Laterality: N/A;  11:00am   ESOPHAGOGASTRODUODENOSCOPY (EGD) WITH PROPOFOL  N/A 03/11/2023   Procedure: ESOPHAGOGASTRODUODENOSCOPY (EGD) WITH PROPOFOL ;  Surgeon: Ace Holder, MD;  Location: [facility];  Service: Gastroenterology;  Laterality: N/A;   ESOPHAGOGASTRODUODENOSCOPY (EGD) WITH PROPOFOL  N/A 04/25/2023   Procedure: ESOPHAGOGASTRODUODENOSCOPY (EGD) WITH PROPOFOL ;  Surgeon: Vinetta Greening, DO;  [facility];  Service: Endoscopy;  Laterality: N/A;  10:30 am, asa 3   GASTROJEJUNOSTOMY N/A 01/03/2021   Procedure: GASTROJEJUNOSTOMY;  Surgeon: Alanda Allegra, MD;  Location: [facility];  Service: General;  Laterality: N/A;   HAND SURGERY     SAVORY DILATION N/A 01/05/2020   Procedure: SAVORY DILATION;  Surgeon: Alyce Jubilee, MD;  [facility]: [facility];  Service: Endoscopy;  Laterality: N/A;   Patient Active Problem List   Diagnosis Date Noted   Seasonal allergic rhinitis due to pollen 01/17/2024   Sneezing 01/17/2024   Viral respiratory illness 01/17/2024   COVID-19 01/17/2024   SIADH (syndrome of inappropriate ADH production) (HCC) 09/07/2023   Anastomotic stricture of gastrojejunostomy 04/07/2023   PUD (peptic ulcer disease) 04/02/2023   Anemia 04/02/2023   Marginal ulcer 03/12/2023   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   Coffee ground emesis 03/11/2023   NSAID long-term use 03/11/2023   Anemia,  posthemorrhagic, acute 03/11/2023   Upper GI bleeding 03/10/2023   Hypertension associated with type 2 diabetes mellitus (HCC) 01/15/2023   Constipation 10/25/2022   Upper abdominal pain  07/06/2022   Nausea and vomiting 07/06/2022   Hyponatremia 06/23/2021   Low serum vitamin B12 03/23/2021   Vitamin D deficiency 03/23/2021   S/P bypass gastrojejunostomy 01/03/2021   Gastric outlet obstruction    Duodenal stricture    Primary osteoarthritis of both hips 11/15/2020   Aortic atherosclerosis (HCC) 08/30/2020   Coronary artery calcification 08/30/2020   Bilateral carpal tunnel syndrome 06/07/2020   Grief reaction 06/07/2020   Pyloric stenosis in adult 02/05/2020   Congenital hypertrophic pyloric stenosis    Duodenal web    Barrett's esophagus without dysplasia 05/19/2019   OSA (obstructive sleep apnea) 04/30/2019   Iron  deficiency anemia 11/25/2018   Phimosis of penis 11/19/2018   Esophageal dysphagia 08/19/2018   Benign prostatic hyperplasia with post-void dribbling 01/31/2018   Absence of bladder continence 01/31/2018   Obesity (BMI 30-39.9) 11/05/2017   GERD (gastroesophageal reflux disease) 06/03/2017   Chronic pain of both knees 06/03/2017   Chronic lower back pain 06/03/2017   Type 2 diabetes mellitus without complication, without long-term current use of insulin  (HCC) 04/12/2016   Essential hypertension 04/12/2016    PCP: Lawrance Presume, MD   REFERRING PROVIDER: Shauna Del, DO  REFERRING DIAG: [phone] (ICD-10-CM) - Acquired left foot drop M48.061 (ICD-10-CM) - Spinal stenosis of lumbar region, unspecified whether neurogenic claudication present R29.898 (ICD-10-CM) - Ankle weakness  THERAPY DIAG:  No diagnosis found.  Rationale for Evaluation and Treatment: Rehabilitation  ONSET DATE: chronic  SUBJECTIVE:   SUBJECTIVE STATEMENT: Pt attended today's session with reports of minimal pain and stiffness following last session Pt stated that they have maintained good compliance with current HEP.  Overall feels that he can get off the floor easier, foot drop has improved a little bit and can stand on his L side for longer.    HPI: Jeremy [name] is  a pleasant 63 y.o. male who presents today for follow-up of chronic right knee pain status post mechanical fall.  Also having difficulty walking with reported left ankle foot drop.   Right knee -has had some issues in the past with knee exacerbated as she had 2 falls in the same week.  Fell directly onto the right knee.  He had quite a bit of pain and swelling initially.  This has gotten better with time, activity modification.  He also has been using his tramadol  medication.  His pain is much better in the knee now but is worried why he is falling.   Left ankle/gait issues - he has a history of lumbar spinal stenosis.  He also has a history of L3-L4 or L4-L5 disc herniation/protrusion.  He did have surgery back in 1992 with Dr. Cipriano Creeks.  He currently is seen at [facility].  He also uses Flexeril  twice daily.  He does have some back tightness, but having issues with reported left foot drop.  He feels like his big toe drags on the ground when he walks longer distances.  PERTINENT HISTORY: 1. Chronic pain of right knee   2. Acquired left foot drop   3. Spinal stenosis of lumbar region, unspecified whether neurogenic claudication present   4. Ankle weakness   5. Chondrocalcinosis articularis     Plan: Impression is right knee pain that was exacerbated by mechanical falls.  Has a improving small effusion on  the knee joint with evidence of chondrocalcinosis.  Given this is largely improved, recommended ice/heat and compression.  He is more worried about his gait with reported evidence of foot drop.  This was more progressive and fatigable when I watched him ambulate today.  He is following up with [facility] which I would like him to continue.  I would like to send him to formalized physical therapy to work on mobility about his lumbar spine but also with ankle dorsiflexion and strengthening.  We did discuss that if this is a true neurologic foot drop, we could consider an AFO ankle  brace, but I would like to see what sort of improvements he can make with his gait with physical therapy first - recommended seeing [facility] for this.  Additional considerations may include updated MRI of the lumbar spine.  He will continue his tramadol  50 mg, 2 tablets twice daily as needed.  He also may use Flexeril  10 mg twice daily as needed.  Follow-up in 6 weeks. PAIN:  Are you having pain? No  PRECAUTIONS: Fall  RED FLAGS: None   WEIGHT BEARING RESTRICTIONS: No  FALLS:  Has patient fallen in last 6 months? Yes. Number of falls 2-3   OCCUPATION: repairman  PLOF: Independent  PATIENT GOALS: To improve my L ankle function  NEXT MD VISIT: TBD  OBJECTIVE:  Note: Objective measures were completed at Evaluation unless otherwise noted.  DIAGNOSTIC FINDINGS: none  PATIENT SURVEYS:  LEFS 22/80    MUSCLE LENGTH: Not applicable   POSTURE: No Significant postural limitations  PALPATION:   LOWER EXTREMITY ROM:  Active ROM Right eval Left eval  Hip flexion    Hip extension    Hip abduction    Hip adduction    Hip internal rotation    Hip external rotation    Knee flexion    Knee extension    Ankle dorsiflexion 8d 8d  Ankle plantarflexion    Ankle inversion    Ankle eversion     (Blank rows = not tested)  LOWER EXTREMITY MMT:  MMT Right eval Left eval  Hip flexion    Hip extension    Hip abduction    Hip adduction    Hip internal rotation    Hip external rotation    Knee flexion    Knee extension    Ankle dorsiflexion 5 4  Ankle plantarflexion    Ankle inversion    Ankle eversion     (Blank rows = not tested)  LOWER EXTREMITY SPECIAL TESTS:  Not applicable  FUNCTIONAL TESTS:  30 seconds chair stand test 8 stands SLS 15s R, 8s L DTRs Diminished L achilles, (?) hyperactive L patellar  GAIT: Distance walked: 23ftx2 Assistive device utilized: None Level of assistance: Complete Independence Comments: unremarkable initially  TREATMENT: [facility] Adult PT Treatment:                                                DATE: 03/24/2024 Neuromuscular re-ed: SLS on compliant surface 2x2' LLE, UE support PRN Standing marches with YTB to induce DF 2x12 Standing slant board gastroc stretch 2x1' Rocker board a/p, eccentric control into DF 2x15, UE support PRN, ensure feet are as forward as possible on board Therapeutic Activity: NuStep 8' for activity tolerance   [facility] Adult PT Treatment:                                                DATE: 03/19/2024 Therapeutic Activity: NuStep 5' for activity tolerance Slant board gastroc stretch 2x1' Standing toe raise 2x15, hold 3s  Standing march w/YTB 2x10, hold 2s, UE support PRN (single fingertip assist on 2nd set)   [facility] Adult PT Treatment:                                                DATE: 03/13/24 Eval and HEP Self Care: Additional minutes spent for educating on updated Therapeutic Home Exercise Program as well as comparing current status to condition at start of symptoms. This included exercises focusing on stretching, strengthening, with focus on eccentric aspects. Long term goals include an improvement in range of motion, strength, endurance as well as avoiding reinjury. Patient's frequency would include in 1-2 times a day, 3-5 times a week for a duration of 6-12 weeks. Proper technique shown and discussed handout in great detail. All questions were discussed and addressed.       PATIENT EDUCATION:  Education details: Discussed eval findings, rehab rationale and POC and patient is in agreement  Person educated: Patient Education method: Explanation and Handouts Education comprehension: verbalized understanding and needs further education  HOME EXERCISE PROGRAM: Access Code: [email or web address] URL: [email or web address] Date: 03/13/2024 Prepared by:  Gretta Leavens  Exercises - Heel Toe Raises with Counter Support  - 1-2 x daily - 5 x weekly - 2 sets - 15 reps - Single Leg Stance with Support  - 1-2 x daily - 5 x weekly - 1 sets - 3 reps - 30s hold - Single Leg Heel Raise  - 1-2 x daily - 5 x weekly - 2 sets - 10 reps  ASSESSMENT:  CLINICAL IMPRESSION: Pt attended physical therapy session for continuation of treatment regarding L foot drop. Today's treatment focused on improvement of  achilles complex motility, SLS time on LLE, and strength with DF. Pt showed  great tolerance to administered treatment with no adverse effects by the end of session. Skilled intervention was utilized via activity modification for pt tolerance with task completion, functional progression/regression promoting best outcomes inline with current rehab goals, as well as minimal verbal/tactile cuing alongside no physical assistance for safe and appropriate performance of today's activities.    Patient is a 63 y.o. male who was seen today for physical therapy evaluation and treatment for L foot drop. Patient presents with mild strength deficits in L DF, B heelcord tightness, diminished achilles reflex and decreased SLS  time compared to R.  Recommend PT to address deficits and return to MD in 4 weeks for re-assess.  OBJECTIVE IMPAIRMENTS: Abnormal gait, decreased activity tolerance, decreased balance, decreased coordination, decreased endurance, decreased knowledge of condition, decreased mobility, difficulty walking, and decreased strength.   ACTIVITY LIMITATIONS: stairs and locomotion level  PERSONAL FACTORS: Age, Fitness, Time since onset of injury/illness/exacerbation, and 1 comorbidity: spinal stenosis are also affecting patient's functional outcome.   REHAB POTENTIAL: Fair based on chronicity and concurrent spinal stenosis    CLINICAL DECISION MAKING: Evolving/moderate complexity  EVALUATION COMPLEXITY: Moderate   GOALS: Goals reviewed with patient?  No  SHORT TERM GOALS=LONG TERM GOALS: Target date: 05/13/24  Patient will score at least 30/80 on LEFS to signify clinically meaningful improvement in functional abilities.   Baseline: 22/80 Goal status: INITIAL  2.  Patient will acknowledge/maintain 0/10 pain at least once during episode of care   Baseline: 0/10 Goal status: INITIAL  3.  Patient will increase 30s chair stand reps from 8 to 10 without arms to demonstrate and improved functional ability with less pain/difficulty as well as reduce fall risk.  Baseline: 8 Goal status: INITIAL  4.  Patient to demonstrate independence in HEP  Baseline: [id number] Goal status: INITIAL  5.  Increase L SLS to 15s Baseline: 8s Goal status: INITIAL  6.  Increase B DF AROM to 12d Baseline: 8d Goal status: INITIAL   PLAN:  PT FREQUENCY: 1-2x/week  PT DURATION: 6 weeks  PLANNED INTERVENTIONS: 97110-Therapeutic exercises, 97530- Therapeutic activity, [id number]- Neuromuscular re-education, 97535- Self Care, [id number]- Manual therapy, [id number]- Gait training, [phone]- Splinting, [phone]- Electrical stimulation (manual), Patient/Family education, Balance training, and Stair training  PLAN FOR NEXT SESSION: HEP review and update, manual techniques as appropriate, aerobic tasks, ROM and flexibility activities, strengthening and PREs, TPDN, gait and balance training as needed    For all possible CPT codes, reference the Planned Interventions line above.     Check all conditions that are expected to impact treatment: {Conditions expected to impact treatment:Diabetes mellitus and Complications related to surgery   If treatment provided at initial evaluation, no treatment charged due to lack of authorization.       Adilynne Fitzwater M Aashrith Eves, PT 03/31/2024, 9:24 AM

## 2024-03-31 NOTE — Telephone Encounter (Signed)
 TC due to missed visit.  Patient confused appointment time but escheduled for 04/01/24

## 2024-04-01 ENCOUNTER — Ambulatory Visit

## 2024-04-01 DIAGNOSIS — M6281 Muscle weakness (generalized): Secondary | ICD-10-CM

## 2024-04-01 DIAGNOSIS — M5459 Other low back pain: Secondary | ICD-10-CM | POA: Diagnosis not present

## 2024-04-01 DIAGNOSIS — M21372 Foot drop, left foot: Secondary | ICD-10-CM

## 2024-04-01 DIAGNOSIS — R2689 Other abnormalities of gait and mobility: Secondary | ICD-10-CM

## 2024-04-01 NOTE — Therapy (Signed)
 OUTPATIENT PHYSICAL THERAPY TREATMENT NOTE   Patient Name: Jeremy [name] MRN: [id number] DOB:December 07, 1960, 63 y.o., male Today's Date: 04/01/2024  END OF SESSION:  PT End of Session - 04/01/24 1325     Visit Number 4    Number of Visits 4    Date for PT Re-Evaluation 05/13/24    Authorization Type MCD    Authorization Time Period Approved 7 visits 03/13/24-05/11/24    Authorization - Visit Number 4    Authorization - Number of Visits 7    PT Start Time 1325    PT Stop Time 1400    PT Time Calculation (min) 35 min    Activity Tolerance Patient tolerated treatment well    Behavior During Therapy [facility] for tasks assessed/performed            Past Medical History:  Diagnosis Date   Diabetes mellitus without complication (HCC)    GERD (gastroesophageal reflux disease)    Heart murmur    Hyperlipidemia    Hypertension    IDA (iron  deficiency anemia)    Rash of entire body 03/2016   Sleep apnea    Past Surgical History:  Procedure Laterality Date   BACK SURGERY  1993   lumbar   BALLOON DILATION N/A 04/25/2023   Procedure: BALLOON DILATION;  Surgeon: Vinetta Greening, DO;  [facility];  Service: Endoscopy;  Laterality: N/A;   BIOPSY  02/07/2019   Procedure: BIOPSY;  Surgeon: Alyce Jubilee, MD;  Location: [facility];  Service: Endoscopy;;   BIOPSY  01/30/2022   Procedure: BIOPSY;  Surgeon: Vinetta Greening, DO;  [facility]: [facility];  Service: Endoscopy;;   BIOPSY  04/25/2023   Procedure: BIOPSY;  Surgeon: Vinetta Greening, DO;  [facility]: [facility];  Service: Endoscopy;;   CERVICAL SPINE SURGERY  2000   COLONOSCOPY N/A 02/07/2019   Dr. Nolene Baumgarten: External hemorrhoids next colonoscopy in 10 years   ESOPHAGOGASTRODUODENOSCOPY N/A 02/07/2019   Dr. Nolene Baumgarten: Barrett's esophagus without dysplasia chronic inactive gastritis but no H. 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, nonbleeding duodenal  diverticulum,   ESOPHAGOGASTRODUODENOSCOPY N/A 01/05/2020   Procedure: ESOPHAGOGASTRODUODENOSCOPY (EGD);  Surgeon: Alyce Jubilee, MD;  [facility]: [facility];  Service: Endoscopy;  Laterality: N/A;  10:30am   ESOPHAGOGASTRODUODENOSCOPY (EGD) WITH PROPOFOL  N/A 12/06/2020   Procedure: ESOPHAGOGASTRODUODENOSCOPY (EGD) WITH PROPOFOL ;  Surgeon: Vinetta Greening, DO;  [facility]: [facility];  Service: Endoscopy;  Laterality: N/A;  11:45am   ESOPHAGOGASTRODUODENOSCOPY (EGD) WITH PROPOFOL  N/A 12/22/2021   long-segment Barrett's s/p biopsy, gastric bypass with normal sized puch and intact staple line. GJ anastomosis with healthy mucosa. Gastritis.   ESOPHAGOGASTRODUODENOSCOPY (EGD) WITH PROPOFOL  N/A 01/30/2022   Procedure: ESOPHAGOGASTRODUODENOSCOPY (EGD) WITH PROPOFOL ;  Surgeon: Vinetta Greening, DO;  [facility];  Service: Endoscopy;  Laterality: N/A;  11:00am   ESOPHAGOGASTRODUODENOSCOPY (EGD) WITH PROPOFOL  N/A 03/11/2023   Procedure: ESOPHAGOGASTRODUODENOSCOPY (EGD) WITH PROPOFOL ;  Surgeon: Ace Holder, MD;  Location: [facility] END[facility]COPY;  Service: Gastroenterology;  Laterality: N/A;   ESOPHAGOGASTRODUODENOSCOPY (EGD) WITH PROPOFOL  N/A 04/25/2023   Procedure: ESOPHAGOGASTRODUODENOSCOPY (EGD) WITH PROPOFOL ;  Surgeon: Vinetta Greening, DO;  [facility];  Service: Endoscopy;  Laterality: N/A;  10:30 am, asa 3   GASTROJEJUNOSTOMY N/A 01/03/2021   Procedure: GASTROJEJUNOSTOMY;  Surgeon: Alanda Allegra, MD;  Location: [facility];  Service: General;  Laterality: N/A;   HAND SURGERY     SAVORY DILATION N/A  01/05/2020   Procedure: SAVORY DILATION;  Surgeon: Alyce Jubilee, MD;  [facility]: [facility];  Service: Endoscopy;  Laterality: N/A;   Patient Active Problem List   Diagnosis Date Noted   Seasonal allergic rhinitis due to pollen 01/17/2024   Sneezing 01/17/2024   Viral respiratory illness 01/17/2024   COVID-19 01/17/2024   SIADH (syndrome of inappropriate ADH production)  (HCC) 09/07/2023   Anastomotic stricture of gastrojejunostomy 04/07/2023   PUD (peptic ulcer disease) 04/02/2023   Anemia 04/02/2023   Marginal ulcer 03/12/2023   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   Coffee ground emesis 03/11/2023   NSAID long-term use 03/11/2023   Anemia, posthemorrhagic, acute 03/11/2023   Upper GI bleeding 03/10/2023   Hypertension associated with type 2 diabetes mellitus (HCC) 01/15/2023   Constipation 10/25/2022   Upper abdominal pain 07/06/2022   Nausea and vomiting 07/06/2022   Hyponatremia 06/23/2021   Low serum vitamin B12 03/23/2021   Vitamin D deficiency 03/23/2021   S/P bypass gastrojejunostomy 01/03/2021   Gastric outlet obstruction    Duodenal stricture    Primary osteoarthritis of both hips 11/15/2020   Aortic atherosclerosis (HCC) 08/30/2020   Coronary artery calcification 08/30/2020   Bilateral carpal tunnel syndrome 06/07/2020   Grief reaction 06/07/2020   Pyloric stenosis in adult 02/05/2020   Congenital hypertrophic pyloric stenosis    Duodenal web    Barrett's esophagus without dysplasia 05/19/2019   OSA (obstructive sleep apnea) 04/30/2019   Iron  deficiency anemia 11/25/2018   Phimosis of penis 11/19/2018   Esophageal dysphagia 08/19/2018   Benign prostatic hyperplasia with post-void dribbling 01/31/2018   Absence of bladder continence 01/31/2018   Obesity (BMI 30-39.9) 11/05/2017   GERD (gastroesophageal reflux disease) 06/03/2017   Chronic pain of both knees 06/03/2017   Chronic lower back pain 06/03/2017   Type 2 diabetes mellitus without complication, without long-term current use of insulin  (HCC) 04/12/2016   Essential hypertension 04/12/2016    PCP: Lawrance Presume, MD   REFERRING PROVIDER: Shauna Del, DO  REFERRING DIAG: [phone] (ICD-10-CM) - Acquired left foot drop M48.061 (ICD-10-CM) - Spinal stenosis of lumbar region, unspecified whether neurogenic claudication present R29.898 (ICD-10-CM) - Ankle  weakness  THERAPY DIAG:  Foot drop, left  Other abnormalities of gait and mobility  Muscle weakness  Rationale for Evaluation and Treatment: Rehabilitation  ONSET DATE: chronic  SUBJECTIVE:   SUBJECTIVE STATEMENT: Patient arrives with no ankle pain, cites low back soreness.  Continues to report near daily episodes of dragging feet, unilaterally, but never both at the same time.    HPI: Jeremy [name] is a pleasant 63 y.o. male who presents today for follow-up of chronic right knee pain status post mechanical fall.  Also having difficulty walking with reported left ankle foot drop.   Right knee -has had some issues in the past with knee exacerbated as she had 2 falls in the same week.  Fell directly onto the right knee.  He had quite a bit of pain and swelling initially.  This has gotten better with time, activity modification.  He also has been using his tramadol  medication.  His pain is much better in the knee now but is worried why he is falling.   Left ankle/gait issues - he has a history of lumbar spinal stenosis.  He also has a history of L3-L4 or L4-L5 disc herniation/protrusion.  He did have surgery back in 1992 with Dr. Cipriano Creeks.  He currently is seen at [facility].  He also  uses Flexeril  twice daily.  He does have some back tightness, but having issues with reported left foot drop.  He feels like his big toe drags on the ground when he walks longer distances.  PERTINENT HISTORY: 1. Chronic pain of right knee   2. Acquired left foot drop   3. Spinal stenosis of lumbar region, unspecified whether neurogenic claudication present   4. Ankle weakness   5. Chondrocalcinosis articularis     Plan: Impression is right knee pain that was exacerbated by mechanical falls.  Has a improving small effusion on the knee joint with evidence of chondrocalcinosis.  Given this is largely improved, recommended ice/heat and compression.  He is more worried about his gait with  reported evidence of foot drop.  This was more progressive and fatigable when I watched him ambulate today.  He is following up with [facility] which I would like him to continue.  I would like to send him to formalized physical therapy to work on mobility about his lumbar spine but also with ankle dorsiflexion and strengthening.  We did discuss that if this is a true neurologic foot drop, we could consider an AFO ankle brace, but I would like to see what sort of improvements he can make with his gait with physical therapy first - recommended seeing [facility] for this.  Additional considerations may include updated MRI of the lumbar spine.  He will continue his tramadol  50 mg, 2 tablets twice daily as needed.  He also may use Flexeril  10 mg twice daily as needed.  Follow-up in 6 weeks. PAIN:  Are you having pain? No  PRECAUTIONS: Fall  RED FLAGS: None   WEIGHT BEARING RESTRICTIONS: No  FALLS:  Has patient fallen in last 6 months? Yes. Number of falls 2-3   OCCUPATION: repairman  PLOF: Independent  PATIENT GOALS: To improve my L ankle function  NEXT MD VISIT: TBD  OBJECTIVE:  Note: Objective measures were completed at Evaluation unless otherwise noted.  DIAGNOSTIC FINDINGS: none  PATIENT SURVEYS:  LEFS 22/80    MUSCLE LENGTH: Not applicable   POSTURE: No Significant postural limitations  PALPATION:   LOWER EXTREMITY ROM:  Active ROM Right eval Left eval  Hip flexion    Hip extension    Hip abduction    Hip adduction    Hip internal rotation    Hip external rotation    Knee flexion    Knee extension    Ankle dorsiflexion 8d 8d  Ankle plantarflexion    Ankle inversion    Ankle eversion     (Blank rows = not tested)  LOWER EXTREMITY MMT:  MMT Right eval Left eval  Hip flexion    Hip extension    Hip abduction    Hip adduction    Hip internal rotation    Hip external rotation    Knee flexion    Knee extension    Ankle dorsiflexion 5 4   Ankle plantarflexion    Ankle inversion    Ankle eversion     (Blank rows = not tested)  LOWER EXTREMITY SPECIAL TESTS:  Not applicable  FUNCTIONAL TESTS:  30 seconds chair stand test 8 stands SLS 15s R, 8s L; 04/01/24 10s R, <10s L DTRs Diminished L achilles, (?) hyperactive L patellar  GAIT: Distance walked: 36ftx2 Assistive device utilized: None Level of assistance: Complete Independence Comments: unremarkable initially  TREATMENT: [facility] Adult PT Treatment:                                                DATE: 04/01/24  Neuromuscular re-ed: Placing 5# KB on 4 in step 10x Heel walking at counter top 3 trips Therapeutic Activity: Re-assessment of function and progress towards goals TM 0.9 MPH 4 min 0d, 4 min 5% grade   Gastroc/soleus stretch 30s x2 ea.   [facility] Adult PT Treatment:                                                DATE: 03/24/2024 Neuromuscular re-ed: SLS on compliant surface 2x2' LLE, UE support PRN Standing marches with YTB to induce DF 2x12 Standing slant board gastroc stretch 2x1' Rocker board a/p, eccentric control into DF 2x15, UE support PRN, ensure feet are as forward as possible on board Therapeutic Activity: NuStep 8' for activity tolerance   [facility] Adult PT Treatment:                                                DATE: 03/19/2024 Therapeutic Activity: NuStep 5' for activity tolerance Slant board gastroc stretch 2x1' Standing toe raise 2x15, hold 3s  Standing march w/YTB 2x10, hold 2s, UE support PRN (single fingertip assist on 2nd set)   [facility] Adult PT Treatment:                                                DATE: 03/13/24 Eval and HEP Self Care: Additional minutes spent for educating on updated Therapeutic Home Exercise Program as well as comparing current status to condition at start of symptoms. This included  exercises focusing on stretching, strengthening, with focus on eccentric aspects. Long term goals include an improvement in range of motion, strength, endurance as well as avoiding reinjury. Patient's frequency would include in 1-2 times a day, 3-5 times a week for a duration of 6-12 weeks. Proper technique shown and discussed handout in great detail. All questions were discussed and addressed.       PATIENT EDUCATION:  Education details: Discussed eval findings, rehab rationale and POC and patient is in agreement  Person educated: Patient Education method: Explanation and Handouts Education comprehension: verbalized understanding and needs further education  HOME EXERCISE PROGRAM: Access Code: [email or web address] URL: [email or web address] Date: 03/13/2024 Prepared by: Gretta Leavens  Exercises - Heel Toe Raises with Counter Support  - 1-2 x daily - 5 x weekly - 2 sets - 15 reps - Single Leg Stance with Support  - 1-2 x daily - 5 x weekly - 1 sets - 3 reps - 30s hold - Single Leg Heel Raise  - 1-2 x daily - 5 x weekly - 2 sets - 10 reps  ASSESSMENT:  CLINICAL IMPRESSION: Focus of today was DF strengthening and stretching and attempting to reproduce symptoms through fatigue.  L DF weakness evident with high level tasks of heel walking and using L foot  to manipulate a weight onto/off of a 4 in step.  Updated HEP with heel walking to addres weakness but symptoms may be related to spinal stenosis.   Patient is a 63 y.o. male who was seen today for physical therapy evaluation and treatment for L foot drop. Patient presents with mild strength deficits in L DF, B heelcord tightness, diminished achilles reflex and decreased SLS time compared to R.  Recommend PT to address deficits and return to MD in 4 weeks for re-assess.  OBJECTIVE IMPAIRMENTS: Abnormal gait, decreased activity tolerance, decreased balance, decreased coordination, decreased endurance, decreased knowledge of condition,  decreased mobility, difficulty walking, and decreased strength.   ACTIVITY LIMITATIONS: stairs and locomotion level  PERSONAL FACTORS: Age, Fitness, Time since onset of injury/illness/exacerbation, and 1 comorbidity: spinal stenosis are also affecting patient's functional outcome.   REHAB POTENTIAL: Fair based on chronicity and concurrent spinal stenosis    CLINICAL DECISION MAKING: Evolving/moderate complexity  EVALUATION COMPLEXITY: Moderate   GOALS: Goals reviewed with patient? No  SHORT TERM GOALS=LONG TERM GOALS: Target date: 05/13/24  Patient will score at least 30/80 on LEFS to signify clinically meaningful improvement in functional abilities.   Baseline: 22/80 Goal status: INITIAL  2.  Patient will acknowledge/maintain 0/10 pain at least once during episode of care   Baseline: 0/10 Goal status: INITIAL  3.  Patient will increase 30s chair stand reps from 8 to 10 without arms to demonstrate and improved functional ability with less pain/difficulty as well as reduce fall risk.  Baseline: 8 Goal status: INITIAL  4.  Patient to demonstrate independence in HEP  Baseline: [email or web address] Goal status: INITIAL  5.  Increase L SLS to 15s Baseline: 8s Goal status: INITIAL  6.  Increase B DF AROM to 12d Baseline: 8d Goal status: INITIAL   PLAN:  PT FREQUENCY: 1-2x/week  PT DURATION: 6 weeks  PLANNED INTERVENTIONS: 97110-Therapeutic exercises, 97530- Therapeutic activity, [id number]- Neuromuscular re-education, 97535- Self Care, [id number]- Manual therapy, [id number]- Gait training, [phone]- Splinting, [phone]- Electrical stimulation (manual), Patient/Family education, Balance training, and Stair training  PLAN FOR NEXT SESSION: HEP review and update, manual techniques as appropriate, aerobic tasks, ROM and flexibility activities, strengthening and PREs, TPDN, gait and balance training as needed    For all possible CPT codes, reference the Planned Interventions line above.     Check all  conditions that are expected to impact treatment: {Conditions expected to impact treatment:Diabetes mellitus and Complications related to surgery   If treatment provided at initial evaluation, no treatment charged due to lack of authorization.       Sopheap Boehle M Terrah Decoster, PT 04/01/2024, 3:04 PM

## 2024-04-03 ENCOUNTER — Other Ambulatory Visit: Payer: Self-pay | Admitting: Internal Medicine

## 2024-04-03 MED ORDER — SILDENAFIL CITRATE 50 MG PO TABS
50.0000 mg | ORAL_TABLET | ORAL | 6 refills | Status: AC | PRN
  Filled 2024-04-03: qty 10, 10d supply, fill #0
  Filled 2024-05-23: qty 10, 10d supply, fill #1
  Filled 2024-08-10: qty 10, 10d supply, fill #2

## 2024-04-04 ENCOUNTER — Other Ambulatory Visit: Payer: Self-pay

## 2024-04-04 ENCOUNTER — Encounter: Payer: Self-pay | Admitting: Physician Assistant

## 2024-04-06 NOTE — Therapy (Unsigned)
 OUTPATIENT PHYSICAL THERAPY TREATMENT NOTE/DC SUMMARY   Patient Name: Jeremy [name] MRN: [id number] DOB:03/31/1961, 63 y.o., male Today's Date: 04/07/2024 PHYSICAL THERAPY DISCHARGE SUMMARY  Visits from Start of Care: 5  Current functional level related to goals / functional outcomes: Partially met   Remaining deficits: Claudication symptoms   Education / Equipment: HEP   Patient agrees to discharge. Patient goals were partially met. Patient is being discharged due to being pleased with the current functional level.  END OF SESSION:  PT End of Session - 04/07/24 1653     Visit Number 5    Number of Visits 5    Date for PT Re-Evaluation 05/13/24    Authorization Type MCD    Authorization Time Period Approved 7 visits 03/13/24-05/11/24    Authorization - Number of Visits 7    PT Start Time 1653    PT Stop Time 1731    PT Time Calculation (min) 38 min    Activity Tolerance Patient tolerated treatment well    Behavior During Therapy [facility] for tasks assessed/performed             Past Medical History:  Diagnosis Date   Diabetes mellitus without complication (HCC)    GERD (gastroesophageal reflux disease)    Heart murmur    Hyperlipidemia    Hypertension    IDA (iron  deficiency anemia)    Rash of entire body 03/2016   Sleep apnea    Past Surgical History:  Procedure Laterality Date   BACK SURGERY  1993   lumbar   BALLOON DILATION N/A 04/25/2023   Procedure: BALLOON DILATION;  Surgeon: Cindie Carlin POUR, DO;  [facility];  Service: Endoscopy;  Laterality: N/A;   BIOPSY  02/07/2019   Procedure: BIOPSY;  Surgeon: Harvey Margo LITTIE, MD;  [facility]: [facility];  Service: Endoscopy;;   BIOPSY  01/30/2022   Procedure: BIOPSY;  Surgeon: Cindie Carlin POUR, DO;  [facility]: [facility];  Service: Endoscopy;;   BIOPSY  04/25/2023   Procedure: BIOPSY;  Surgeon: Cindie Carlin POUR, DO;  [facility];  Service: Endoscopy;;   CERVICAL SPINE SURGERY  2000    COLONOSCOPY N/A 02/07/2019   Dr. Harvey: External hemorrhoids next colonoscopy in 10 years   ESOPHAGOGASTRODUODENOSCOPY N/A 02/07/2019   Dr. Harvey: Barrett's esophagus without dysplasia chronic inactive gastritis but no H. 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, nonbleeding duodenal diverticulum,   ESOPHAGOGASTRODUODENOSCOPY N/A 01/05/2020   Procedure: ESOPHAGOGASTRODUODENOSCOPY (EGD);  Surgeon: Harvey Margo LITTIE, MD;  [facility];  Service: Endoscopy;  Laterality: N/A;  10:30am   ESOPHAGOGASTRODUODENOSCOPY (EGD) WITH PROPOFOL  N/A 12/06/2020   Procedure: ESOPHAGOGASTRODUODENOSCOPY (EGD) WITH PROPOFOL ;  Surgeon: Cindie Carlin POUR, DO;  [facility];  Service: Endoscopy;  Laterality: N/A;  11:45am   ESOPHAGOGASTRODUODENOSCOPY (EGD) WITH PROPOFOL  N/A 12/22/2021   long-segment Barrett's s/p biopsy, gastric bypass with normal sized puch and intact staple line. GJ anastomosis with healthy mucosa. Gastritis.   ESOPHAGOGASTRODUODENOSCOPY (EGD) WITH PROPOFOL  N/A 01/30/2022   Procedure: ESOPHAGOGASTRODUODENOSCOPY (EGD) WITH PROPOFOL ;  Surgeon: Cindie Carlin POUR, DO;  [facility];  Service: Endoscopy;  Laterality: N/A;  11:00am   ESOPHAGOGASTRODUODENOSCOPY (EGD) WITH PROPOFOL  N/A 03/11/2023   Procedure: ESOPHAGOGASTRODUODENOSCOPY (EGD) WITH PROPOFOL ;  Surgeon: Leigh Elspeth SQUIBB, MD;  Location: [facility] ENDOSCOPY;  Service: Gastroenterology;  Laterality: N/A;   ESOPHAGOGASTRODUODENOSCOPY (EGD) WITH PROPOFOL  N/A 04/25/2023   Procedure: ESOPHAGOGASTRODUODENOSCOPY (EGD) WITH PROPOFOL ;  Surgeon: Cindie Carlin  K, DO;  [facility];  Service: Endoscopy;  Laterality: N/A;  10:30 am, asa 3   GASTROJEJUNOSTOMY N/A 01/03/2021   Procedure: GASTROJEJUNOSTOMY;  Surgeon: Mavis Anes, MD;  Location: [facility];  Service: General;  Laterality: N/A;   HAND SURGERY     SAVORY DILATION N/A 01/05/2020   Procedure: SAVORY  DILATION;  Surgeon: Harvey Margo CROME, MD;  [facility];  Service: Endoscopy;  Laterality: N/A;   Patient Active Problem List   Diagnosis Date Noted   Seasonal allergic rhinitis due to pollen 01/17/2024   Sneezing 01/17/2024   Viral respiratory illness 01/17/2024   COVID-19 01/17/2024   SIADH (syndrome of inappropriate ADH production) (HCC) 09/07/2023   Anastomotic stricture of gastrojejunostomy 04/07/2023   PUD (peptic ulcer disease) 04/02/2023   Anemia 04/02/2023   Marginal ulcer 03/12/2023   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   Coffee ground emesis 03/11/2023   NSAID long-term use 03/11/2023   Anemia, posthemorrhagic, acute 03/11/2023   Upper GI bleeding 03/10/2023   Hypertension associated with type 2 diabetes mellitus (HCC) 01/15/2023   Constipation 10/25/2022   Upper abdominal pain 07/06/2022   Nausea and vomiting 07/06/2022   Hyponatremia 06/23/2021   Low serum vitamin B12 03/23/2021   Vitamin D deficiency 03/23/2021   S/P bypass gastrojejunostomy 01/03/2021   Gastric outlet obstruction    Duodenal stricture    Primary osteoarthritis of both hips 11/15/2020   Aortic atherosclerosis (HCC) 08/30/2020   Coronary artery calcification 08/30/2020   Bilateral carpal tunnel syndrome 06/07/2020   Grief reaction 06/07/2020   Pyloric stenosis in adult 02/05/2020   Congenital hypertrophic pyloric stenosis    Duodenal web    Barrett's esophagus without dysplasia 05/19/2019   OSA (obstructive sleep apnea) 04/30/2019   Iron  deficiency anemia 11/25/2018   Phimosis of penis 11/19/2018   Esophageal dysphagia 08/19/2018   Benign prostatic hyperplasia with post-void dribbling 01/31/2018   Absence of bladder continence 01/31/2018   Obesity (BMI 30-39.9) 11/05/2017   GERD (gastroesophageal reflux disease) 06/03/2017   Chronic pain of both knees 06/03/2017   Chronic lower back pain 06/03/2017   Type 2 diabetes mellitus without complication, without long-term current  use of insulin  (HCC) 04/12/2016   Essential hypertension 04/12/2016    PCP: Vicci Barnie NOVAK, MD   REFERRING PROVIDER: Burnetta Brunet, DO  REFERRING DIAG: [phone] (ICD-10-CM) - Acquired left foot drop M48.061 (ICD-10-CM) - Spinal stenosis of lumbar region, unspecified whether neurogenic claudication present R29.898 (ICD-10-CM) - Ankle weakness  THERAPY DIAG:  Foot drop, left  Other abnormalities of gait and mobility  Muscle weakness  Rationale for Evaluation and Treatment: Rehabilitation  ONSET DATE: chronic  SUBJECTIVE:   SUBJECTIVE STATEMENT: 0/10 pain.  Feels he is stronger and more mobile. Still noted foot drop but only after 30-45 minutes activity.  HPI: Jeremy [name] is a pleasant 63 y.o. male who presents today for follow-up of chronic right knee pain status post mechanical fall.  Also having difficulty walking with reported left ankle foot drop.   Right knee -has had some issues in the past with knee exacerbated as she had 2 falls in the same week.  Fell directly onto the right knee.  He had quite a bit of pain and swelling initially.  This has gotten better with time, activity modification.  He also has been using his tramadol  medication.  His pain is much better in the knee now but is worried why he is falling.   Left ankle/gait issues - he has  a history of lumbar spinal stenosis.  He also has a history of L3-L4 or L4-L5 disc herniation/protrusion.  He did have surgery back in 1992 with Dr. Alix.  He currently is seen at [facility].  He also uses Flexeril  twice daily.  He does have some back tightness, but having issues with reported left foot drop.  He feels like his big toe drags on the ground when he walks longer distances.  PERTINENT HISTORY: 1. Chronic pain of right knee   2. Acquired left foot drop   3. Spinal stenosis of lumbar region, unspecified whether neurogenic claudication present   4. Ankle weakness   5. Chondrocalcinosis articularis      Plan: Impression is right knee pain that was exacerbated by mechanical falls.  Has a improving small effusion on the knee joint with evidence of chondrocalcinosis.  Given this is largely improved, recommended ice/heat and compression.  He is more worried about his gait with reported evidence of foot drop.  This was more progressive and fatigable when I watched him ambulate today.  He is following up with [facility] which I would like him to continue.  I would like to send him to formalized physical therapy to work on mobility about his lumbar spine but also with ankle dorsiflexion and strengthening.  We did discuss that if this is a true neurologic foot drop, we could consider an AFO ankle brace, but I would like to see what sort of improvements he can make with his gait with physical therapy first - recommended seeing Karleen Kiang for this.  Additional considerations may include updated MRI of the lumbar spine.  He will continue his tramadol  50 mg, 2 tablets twice daily as needed.  He also may use Flexeril  10 mg twice daily as needed.  Follow-up in 6 weeks. PAIN:  Are you having pain? No  PRECAUTIONS: Fall  RED FLAGS: None   WEIGHT BEARING RESTRICTIONS: No  FALLS:  Has patient fallen in last 6 months? Yes. Number of falls 2-3   OCCUPATION: repairman  PLOF: Independent  PATIENT GOALS: To improve my L ankle function  NEXT MD VISIT: TBD  OBJECTIVE:  Note: Objective measures were completed at Evaluation unless otherwise noted.  DIAGNOSTIC FINDINGS: none  PATIENT SURVEYS:  LEFS 22/80 04/07/24 52/80    MUSCLE LENGTH: Not applicable   POSTURE: No Significant postural limitations  PALPATION:   LOWER EXTREMITY ROM:  Active ROM Right eval Left eval  Hip flexion    Hip extension    Hip abduction    Hip adduction    Hip internal rotation    Hip external rotation    Knee flexion    Knee extension    Ankle dorsiflexion 8d 8d  Ankle plantarflexion    Ankle  inversion    Ankle eversion     (Blank rows = not tested)  LOWER EXTREMITY MMT:  MMT Right eval Left eval  Hip flexion    Hip extension    Hip abduction    Hip adduction    Hip internal rotation    Hip external rotation    Knee flexion    Knee extension    Ankle dorsiflexion 5 4  Ankle plantarflexion    Ankle inversion    Ankle eversion     (Blank rows = not tested)  LOWER EXTREMITY SPECIAL TESTS:  Not applicable  FUNCTIONAL TESTS:  30 seconds chair stand test 8 stands; 04/07/24 20 SLS 15s R, 8s  L; 04/01/24 10s R, <10s L; 04/07/24 L 12s DTRs Diminished L achilles, (?) hyperactive L patellar  GAIT: Distance walked: 23ftx2 Assistive device utilized: None Level of assistance: Complete Independence Comments: unremarkable initially                                                                                                                                TREATMENT: [facility] Adult PT Treatment:                                                DATE: 04/07/24  Therapeutic Activity: 30s chair stand test HEP review and update Assessment of ROM, strength and goals   Self Care: Discussion of HEP, ongoing needs  [facility] Adult PT Treatment:                                                DATE: 04/01/24  Neuromuscular re-ed: Placing 5# KB on 4 in step 10x Heel walking at counter top 3 trips Therapeutic Activity: Re-assessment of function and progress towards goals TM 0.9 MPH 4 min 0d, 4 min 5% grade   Gastroc/soleus stretch 30s x2 ea.   [facility] Adult PT Treatment:                                                DATE: 03/24/2024 Neuromuscular re-ed: SLS on compliant surface 2x2' LLE, UE support PRN Standing marches with YTB to induce DF 2x12 Standing slant board gastroc stretch 2x1' Rocker board a/p, eccentric control into DF 2x15, UE support PRN, ensure feet are as forward as possible on board Therapeutic Activity: NuStep 8' for activity tolerance   [facility] Adult PT Treatment:                                                 DATE: 03/19/2024 Therapeutic Activity: NuStep 5' for activity tolerance Slant board gastroc stretch 2x1' Standing toe raise 2x15, hold 3s  Standing march w/YTB 2x10, hold 2s, UE support PRN (single fingertip assist on 2nd set)   [facility] Adult PT Treatment:                                                DATE: 03/13/24 Eval and HEP Self Care: Additional minutes spent for  educating on updated Therapeutic Home Exercise Program as well as comparing current status to condition at start of symptoms. This included exercises focusing on stretching, strengthening, with focus on eccentric aspects. Long term goals include an improvement in range of motion, strength, endurance as well as avoiding reinjury. Patient's frequency would include in 1-2 times a day, 3-5 times a week for a duration of 6-12 weeks. Proper technique shown and discussed handout in great detail. All questions were discussed and addressed.       PATIENT EDUCATION:  Education details: Discussed eval findings, rehab rationale and POC and patient is in agreement  Person educated: Patient Education method: Explanation and Handouts Education comprehension: verbalized understanding and needs further education  HOME EXERCISE PROGRAM: Access Code: [email or web address] URL: [email or web address] Date: 04/07/2024 Prepared by: Reyes Kohut  Exercises - Heel Toe Raises with Counter Support  - 1-2 x daily - 5 x weekly - 2 sets - 15 reps - Single Leg Stance with Support  - 1-2 x daily - 5 x weekly - 1 sets - 3 reps - 30s hold - Single Leg Heel Raise  - 1-2 x daily - 5 x weekly - 2 sets - 10 reps - Heel Walking  - 3 x daily - 5 x weekly  ASSESSMENT:  CLINICAL IMPRESSION: Overall improved.  Continues to report symptoms after 30-45 minutes of prolonged standing and walking, which resolve with rest/sitting, indicating more claudication issues versus DF weakness.  Progress made towards goals, SLS  time and DF ROM still ongoing.    Patient is a 63 y.o. male who was seen today for physical therapy evaluation and treatment for L foot drop. Patient presents with mild strength deficits in L DF, B heelcord tightness, diminished achilles reflex and decreased SLS time compared to R.  Recommend PT to address deficits and return to MD in 4 weeks for re-assess.  OBJECTIVE IMPAIRMENTS: Abnormal gait, decreased activity tolerance, decreased balance, decreased coordination, decreased endurance, decreased knowledge of condition, decreased mobility, difficulty walking, and decreased strength.   ACTIVITY LIMITATIONS: stairs and locomotion level  PERSONAL FACTORS: Age, Fitness, Time since onset of injury/illness/exacerbation, and 1 comorbidity: spinal stenosis are also affecting patient's functional outcome.   REHAB POTENTIAL: Fair based on chronicity and concurrent spinal stenosis    CLINICAL DECISION MAKING: Evolving/moderate complexity  EVALUATION COMPLEXITY: Moderate   GOALS: Goals reviewed with patient? No  SHORT TERM GOALS=LONG TERM GOALS: Target date: 05/13/24  Patient will score at least 30/80 on LEFS to signify clinically meaningful improvement in functional abilities.   Baseline: 22/80; 04/07/24 52/80 Goal status: Met  2.  Patient will acknowledge/maintain 0/10 pain at least once during episode of care   Baseline: 0/10 Goal status: Met  3.  Patient will increase 30s chair stand reps from 8 to 10 without arms to demonstrate and improved functional ability with less pain/difficulty as well as reduce fall risk.  Baseline: 8; 20 Goal status: Met  4.  Patient to demonstrate independence in HEP  Baseline: [email or web address] Goal status: Met  5.  Increase L SLS to 15s Baseline: 8s; 04/07/24 12s Goal status: Partially met  6.  Increase B DF AROM to 12d Baseline: 8d; 04/07/24 8d Goal status: Partially met   PLAN:  PT FREQUENCY: 1-2x/week  PT DURATION: 6 weeks  PLANNED INTERVENTIONS:  [id number]-Therapeutic exercises, 97530- Therapeutic activity, [id number]- Neuromuscular re-education, 97535- Self Care, [id number]- Manual therapy, [id number]- Gait training, [phone]- Splinting, [phone]- Electrical stimulation (manual), Patient/Family education, Balance training, and Stair training  PLAN FOR NEXT  SESSION: HEP review and update, manual techniques as appropriate, aerobic tasks, ROM and flexibility activities, strengthening and PREs, TPDN, gait and balance training as needed    For all possible CPT codes, reference the Planned Interventions line above.     Check all conditions that are expected to impact treatment: {Conditions expected to impact treatment:Diabetes mellitus and Complications related to surgery   If treatment provided at initial evaluation, no treatment charged due to lack of authorization.       Samantha Olivera M Greydis Stlouis, PT 04/07/2024, 5:37 PM

## 2024-04-07 ENCOUNTER — Other Ambulatory Visit: Payer: Self-pay

## 2024-04-07 ENCOUNTER — Ambulatory Visit

## 2024-04-07 DIAGNOSIS — R2689 Other abnormalities of gait and mobility: Secondary | ICD-10-CM | POA: Diagnosis not present

## 2024-04-07 DIAGNOSIS — M6281 Muscle weakness (generalized): Secondary | ICD-10-CM | POA: Diagnosis not present

## 2024-04-07 DIAGNOSIS — M5459 Other low back pain: Secondary | ICD-10-CM | POA: Diagnosis not present

## 2024-04-07 DIAGNOSIS — M21372 Foot drop, left foot: Secondary | ICD-10-CM | POA: Diagnosis not present

## 2024-04-08 ENCOUNTER — Other Ambulatory Visit: Payer: Self-pay

## 2024-04-08 ENCOUNTER — Other Ambulatory Visit: Payer: Self-pay | Admitting: Internal Medicine

## 2024-04-08 DIAGNOSIS — M5451 Vertebrogenic low back pain: Secondary | ICD-10-CM | POA: Diagnosis not present

## 2024-04-08 DIAGNOSIS — M961 Postlaminectomy syndrome, not elsewhere classified: Secondary | ICD-10-CM | POA: Diagnosis not present

## 2024-04-08 DIAGNOSIS — M5416 Radiculopathy, lumbar region: Secondary | ICD-10-CM | POA: Diagnosis not present

## 2024-04-08 DIAGNOSIS — G8929 Other chronic pain: Secondary | ICD-10-CM | POA: Diagnosis not present

## 2024-04-08 DIAGNOSIS — Z79891 Long term (current) use of opiate analgesic: Secondary | ICD-10-CM | POA: Diagnosis not present

## 2024-04-08 DIAGNOSIS — M47816 Spondylosis without myelopathy or radiculopathy, lumbar region: Secondary | ICD-10-CM | POA: Diagnosis not present

## 2024-04-09 ENCOUNTER — Other Ambulatory Visit: Payer: Self-pay

## 2024-04-09 MED ORDER — TRAMADOL HCL 50 MG PO TABS
100.0000 mg | ORAL_TABLET | Freq: Two times a day (BID) | ORAL | 2 refills | Status: DC | PRN
Start: 2024-04-09 — End: 2024-08-13
  Filled 2024-04-09: qty 120, 30d supply, fill #0
  Filled 2024-05-23: qty 120, 30d supply, fill #1
  Filled 2024-07-07: qty 120, 30d supply, fill #2

## 2024-04-10 ENCOUNTER — Other Ambulatory Visit: Payer: Self-pay | Admitting: Nurse Practitioner

## 2024-04-10 DIAGNOSIS — M5416 Radiculopathy, lumbar region: Secondary | ICD-10-CM

## 2024-04-11 ENCOUNTER — Ambulatory Visit
Admission: RE | Admit: 2024-04-11 | Discharge: 2024-04-11 | Disposition: A | Discharge status: Home / Self Care | Source: Ambulatory Visit | Attending: Nurse Practitioner | Admitting: Nurse Practitioner

## 2024-04-11 DIAGNOSIS — M47816 Spondylosis without myelopathy or radiculopathy, lumbar region: Secondary | ICD-10-CM | POA: Diagnosis not present

## 2024-04-11 DIAGNOSIS — M48061 Spinal stenosis, lumbar region without neurogenic claudication: Secondary | ICD-10-CM | POA: Diagnosis not present

## 2024-04-11 DIAGNOSIS — M5416 Radiculopathy, lumbar region: Secondary | ICD-10-CM

## 2024-04-14 ENCOUNTER — Ambulatory Visit: Admitting: Sports Medicine

## 2024-04-14 ENCOUNTER — Encounter: Payer: Self-pay | Admitting: Sports Medicine

## 2024-04-14 DIAGNOSIS — M5442 Lumbago with sciatica, left side: Secondary | ICD-10-CM

## 2024-04-14 DIAGNOSIS — G8929 Other chronic pain: Secondary | ICD-10-CM | POA: Diagnosis not present

## 2024-04-14 DIAGNOSIS — M51362 Other intervertebral disc degeneration, lumbar region with discogenic back pain and lower extremity pain: Secondary | ICD-10-CM

## 2024-04-14 DIAGNOSIS — M48062 Spinal stenosis, lumbar region with neurogenic claudication: Secondary | ICD-10-CM | POA: Diagnosis not present

## 2024-04-14 DIAGNOSIS — M21372 Foot drop, left foot: Secondary | ICD-10-CM

## 2024-04-14 NOTE — Progress Notes (Signed)
 Jeremy [name] - 63 y.o. male MRN [id number]  Date of birth: Feb 21, 1961  Office Visit Note: Visit Date: 04/14/2024 PCP: Vicci Barnie NOVAK, MD Referred by: Vicci Barnie NOVAK, MD  Subjective: Chief Complaint  Patient presents with   Left Foot - Pain    Still having pain. Completed PT. Takes Flexeril  and Tramadol . Helps relieve the pain temporary. Some numbness in feet. Worse with walking (foot drop). Weakness on legs (L>R). States he has MRI done 04/11/2024.    Right Knee - Pain   HPI: Jeremy [name] is a pleasant 63 y.o. male who presents today for follow-up of left leg weakness/foot pain as well as low back pain.  He currently is seen [facility], but states he is not super fond of them.  He has trialed several different lumbar injections with some relief in the past.  They were wanting to perform an RFA, but he was opposed to this.  He did have a recent lumbar spine MRI ordered from an outside physician, but he would like to review this today.  He has been doing formalized physical therapy but just was discharged, this did help his back and did help his gait to some extent but he is still feeling weakness in the legs bilaterally although left is more significant than right.  He has to really pay attention when walking to keep the foot up otherwise hjs toes would drag on the left side.  He also will receive a sharp shooting pain over the lateral to medial shin on the left side that comes and goes.  3 weeks spine and pain clinic, he is managed on Flexeril  10 mg twice daily as well as tramadol  100 mg twice daily.  Pertinent ROS were reviewed with the patient and found to be negative unless otherwise specified above in HPI.   Assessment & Plan: Visit Diagnoses:  1. Degeneration of intervertebral disc of lumbar region with discogenic back pain and lower extremity pain   2. Chronic midline low back pain with left-sided sciatica   3. Spinal stenosis of lumbar region with  neurogenic claudication   4. Acquired left foot drop    Plan: Impression is acute on chronic low back pain with left lower extremity weakness and acquired associated foot drop.  He did have an MRI ordered by an outside physician, but we did review this today which shows notable paracentral disc bulges within the lumbar spine with nerve root contact indicative of L5 and possibly concomitant L4 radiculopathy.  He does have weakness with great toe extension and ankle dorsiflexion on the left side.  Given this, I did discuss the importance of following up with a spine physician for at least evaluation of possible surgical intervention.  He has been receiving lumbar ESI injections with pain management at week without significant relief of his symptoms.  Discussed with Jun that based on his weakness, he should be evaluated for possible surgical or other treatment intervention.  I will refer him to my partner Dr. Georgina, which she is agreeable and understanding to.  He will continue his home therapy in the interim.  May use his Flexeril  10 mg twice daily and his tramadol  100 mg twice daily for his pain control in the interim, which he receives from pain management.  Follow-up: Return for Make new patient appt for Dr. Georgina LBP with L-leg weakness.   Meds & Orders: No orders of the defined types were placed in this encounter.  No orders of the defined types were placed in this encounter.    Procedures: No procedures performed      Clinical History: No specialty comments available.  He reports that he quit smoking about 35 years ago. His smoking use included cigarettes. He has never used smokeless tobacco.  Recent Labs    09/07/23 1120 01/11/24 1025  HGBA1C 5.2 5.8    Objective:    Physical Exam  Gen: Well-appearing, in no acute distress; non-toxic CV: Well-perfused. Warm.  Resp: Breathing unlabored on room air; no wheezing. Psych: Fluid speech in conversation; appropriate affect; normal  thought process  Ortho Exam - Lumbar: No significant TTP palpating along the midline of the lumbar spine.  Mildly ginger range of motion with endrange extension and flexion.  There is 2+ bilateral patellar DTR, 1+ bilateral Achilles DTRs.  There is 3/5 left great toe extension and 4/5 left ankle dorsiflexion strength compared to 5/5 strength of the remainder of lower extremity myotomes in the L3-S1 root.   Imaging:  *Independent review and interpretation of lumbar spine MRI ordered from outside physician from 04/11/2024 was performed by myself today.  Radiology read has not yet been received.  Upon my review, there is multilevel advanced lumbar degenerative disc disease.  There is multiple posterior disc bulges at the L2-L3, L4-L5 and most significantly at the L3-L4 level.  At the L3-L4 level there is a large left-sided paracentral disc herniation that extends into the extraforaminal/far lateral left > right recess with contact upon the exiting nerve root.  There is at least some moderate to severe bilateral (L > R) at the L4-S1 level  Past Medical/Family/Surgical/Social History: Medications & Allergies reviewed per EMR, new medications updated. Patient Active Problem List   Diagnosis Date Noted   Seasonal allergic rhinitis due to pollen 01/17/2024   Sneezing 01/17/2024   Viral respiratory illness 01/17/2024   COVID-19 01/17/2024   SIADH (syndrome of inappropriate ADH production) (HCC) 09/07/2023   Anastomotic stricture of gastrojejunostomy 04/07/2023   PUD (peptic ulcer disease) 04/02/2023   Anemia 04/02/2023   Marginal ulcer 03/12/2023   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   Coffee ground emesis 03/11/2023   NSAID long-term use 03/11/2023   Anemia, posthemorrhagic, acute 03/11/2023   Upper GI bleeding 03/10/2023   Hypertension associated with type 2 diabetes mellitus (HCC) 01/15/2023   Constipation 10/25/2022   Upper abdominal pain 07/06/2022   Nausea and vomiting 07/06/2022    Hyponatremia 06/23/2021   Low serum vitamin B12 03/23/2021   Vitamin D deficiency 03/23/2021   S/P bypass gastrojejunostomy 01/03/2021   Gastric outlet obstruction    Duodenal stricture    Primary osteoarthritis of both hips 11/15/2020   Aortic atherosclerosis (HCC) 08/30/2020   Coronary artery calcification 08/30/2020   Bilateral carpal tunnel syndrome 06/07/2020   Grief reaction 06/07/2020   Pyloric stenosis in adult 02/05/2020   Congenital hypertrophic pyloric stenosis    Duodenal web    Barrett's esophagus without dysplasia 05/19/2019   OSA (obstructive sleep apnea) 04/30/2019   Iron  deficiency anemia 11/25/2018   Phimosis of penis 11/19/2018   Esophageal dysphagia 08/19/2018   Benign prostatic hyperplasia with post-void dribbling 01/31/2018   Absence of bladder continence 01/31/2018   Obesity (BMI 30-39.9) 11/05/2017   GERD (gastroesophageal reflux disease) 06/03/2017   Chronic pain of both knees 06/03/2017   Chronic lower back pain 06/03/2017   Type 2 diabetes mellitus without complication, without long-term current use of insulin  (HCC) 04/12/2016   Essential hypertension 04/12/2016   Past  Medical History:  Diagnosis Date   Diabetes mellitus without complication (HCC)    GERD (gastroesophageal reflux disease)    Heart murmur    Hyperlipidemia    Hypertension    IDA (iron  deficiency anemia)    Rash of entire body 03/2016   Sleep apnea    Family History  Problem Relation Age of Onset   Breast cancer Mother    Pancreatic cancer Mother    CAD Father    Colon cancer Paternal Grandfather    Past Surgical History:  Procedure Laterality Date   BACK SURGERY  1993   lumbar   BALLOON DILATION N/A 04/25/2023   Procedure: BALLOON DILATION;  Surgeon: Cindie Carlin POUR, DO;  Location[facility];  Service: Endoscopy;  Laterality: N/A;   BIOPSY  02/07/2019   Procedure: BIOPSY;  Surgeon: Harvey Margo CROME, MD;  [facility];  Service: Endoscopy;;   BIOPSY   01/30/2022   Procedure: BIOPSY;  Surgeon: Cindie Carlin POUR, DO;  [facility];  Service: Endoscopy;;   BIOPSY  04/25/2023   Procedure: BIOPSY;  Surgeon: Cindie Carlin POUR, DO;  [facility];  Service: Endoscopy;;   CERVICAL SPINE SURGERY  2000   COLONOSCOPY N/A 02/07/2019   Dr. Harvey: External hemorrhoids next colonoscopy in 10 years   ESOPHAGOGASTRODUODENOSCOPY N/A 02/07/2019   Dr. Harvey: Barrett's esophagus without dysplasia chronic inactive gastritis but no H. 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, nonbleeding duodenal diverticulum,   ESOPHAGOGASTRODUODENOSCOPY N/A 01/05/2020   Procedure: ESOPHAGOGASTRODUODENOSCOPY (EGD);  Surgeon: Harvey Margo CROME, MD;  [facility];  Service: Endoscopy;  Laterality: N/A;  10:30am   ESOPHAGOGASTRODUODENOSCOPY (EGD) WITH PROPOFOL  N/A 12/06/2020   Procedure: ESOPHAGOGASTRODUODENOSCOPY (EGD) WITH PROPOFOL ;  Surgeon: Cindie Carlin POUR, DO;  [facility];  Service: Endoscopy;  Laterality: N/A;  11:45am   ESOPHAGOGASTRODUODENOSCOPY (EGD) WITH PROPOFOL  N/A 12/22/2021   long-segment Barrett's s/p biopsy, gastric bypass with normal sized puch and intact staple line. GJ anastomosis with healthy mucosa. Gastritis.   ESOPHAGOGASTRODUODENOSCOPY (EGD) WITH PROPOFOL  N/A 01/30/2022   Procedure: ESOPHAGOGASTRODUODENOSCOPY (EGD) WITH PROPOFOL ;  Surgeon: Cindie Carlin POUR, DO;  [facility];  Service: Endoscopy;  Laterality: N/A;  11:00am   ESOPHAGOGASTRODUODENOSCOPY (EGD) WITH PROPOFOL  N/A 03/11/2023   Procedure: ESOPHAGOGASTRODUODENOSCOPY (EGD) WITH PROPOFOL ;  Surgeon: Leigh Elspeth SQUIBB, MD;  Location: [facility];  Service: Gastroenterology;  Laterality: N/A;   ESOPHAGOGASTRODUODENOSCOPY (EGD) WITH PROPOFOL  N/A 04/25/2023   Procedure: ESOPHAGOGASTRODUODENOSCOPY (EGD) WITH PROPOFOL ;  Surgeon: Cindie Carlin POUR, DO;  [facility];  Service: Endoscopy;   Laterality: N/A;  10:30 am, asa 3   GASTROJEJUNOSTOMY N/A 01/03/2021   Procedure: GASTROJEJUNOSTOMY;  Surgeon: Mavis Anes, MD;  Location: [facility];  Service: General;  Laterality: N/A;   HAND SURGERY     SAVORY DILATION N/A 01/05/2020   Procedure: SAVORY DILATION;  Surgeon: Harvey Margo CROME, MD;  [facility];  Service: Endoscopy;  Laterality: N/A;   Social History   Occupational History   Occupation: Product/process development scientist  Tobacco Use   Smoking status: Former    Current packs/day: 0.00    Types: Cigarettes    Quit date: 10/16/1988    Years since quitting: 35.5   Smokeless tobacco: Never  Vaping Use   Vaping status: Never Used  Substance and Sexual Activity   Alcohol use: No   Drug use: Yes    Comment: rare   Sexual activity: Not  on file

## 2024-04-15 ENCOUNTER — Inpatient Hospital Stay: Attending: Internal Medicine

## 2024-04-15 ENCOUNTER — Inpatient Hospital Stay: Admitting: Internal Medicine

## 2024-04-15 ENCOUNTER — Other Ambulatory Visit: Payer: Self-pay

## 2024-04-15 VITALS — BP 120/65 | HR 58 | Temp 98.0°F | Resp 16 | Ht 70.0 in | Wt 209.0 lb

## 2024-04-15 DIAGNOSIS — K909 Intestinal malabsorption, unspecified: Secondary | ICD-10-CM | POA: Insufficient documentation

## 2024-04-15 DIAGNOSIS — D508 Other iron deficiency anemias: Secondary | ICD-10-CM

## 2024-04-15 DIAGNOSIS — Z9884 Bariatric surgery status: Secondary | ICD-10-CM | POA: Diagnosis not present

## 2024-04-15 LAB — CBC WITH DIFFERENTIAL (CANCER CENTER ONLY)
Abs Immature Granulocytes: 0.09 10*3/uL — ABNORMAL HIGH (ref 0.00–0.07)
Basophils Absolute: 0.1 10*3/uL (ref 0.0–0.1)
Basophils Relative: 1 %
Eosinophils Absolute: 0.5 10*3/uL (ref 0.0–0.5)
Eosinophils Relative: 8 %
HCT: 38.3 % — ABNORMAL LOW (ref 39.0–52.0)
Hemoglobin: 13.3 g/dL (ref 13.0–17.0)
Immature Granulocytes: 2 %
Lymphocytes Relative: 21 %
Lymphs Abs: 1.3 10*3/uL (ref 0.7–4.0)
MCH: 31.8 pg (ref 26.0–34.0)
MCHC: 34.7 g/dL (ref 30.0–36.0)
MCV: 91.6 fL (ref 80.0–100.0)
Monocytes Absolute: 0.6 10*3/uL (ref 0.1–1.0)
Monocytes Relative: 10 %
Neutro Abs: 3.6 10*3/uL (ref 1.7–7.7)
Neutrophils Relative %: 58 %
Platelet Count: 179 10*3/uL (ref 150–400)
RBC: 4.18 MIL/uL — ABNORMAL LOW (ref 4.22–5.81)
RDW: 12.7 % (ref 11.5–15.5)
WBC Count: 6.2 10*3/uL (ref 4.0–10.5)
nRBC: 0 % (ref 0.0–0.2)

## 2024-04-15 LAB — FERRITIN: Ferritin: 47 ng/mL (ref 24–336)

## 2024-04-15 LAB — IRON AND IRON BINDING CAPACITY (CC-WL,HP ONLY)
Iron: 94 ug/dL (ref 45–182)
Saturation Ratios: 28 % (ref 17.9–39.5)
TIBC: 333 ug/dL (ref 250–450)
UIBC: 239 ug/dL (ref 117–376)

## 2024-04-15 NOTE — Progress Notes (Signed)
 [facility] [phone]   [phone]  OFFICE PROGRESS NOTE  Vicci Barnie NOVAK, MD [address] [place] [address]  DIAGNOSIS: Iron  deficiency anemia secondary to malabsorption status post bypass gastrojejunostomy in March 2022.  PRIOR THERAPY: Iron  infusion with Venofer  300 mg IV weekly for 3 weeks.  CURRENT THERAPY: Over-the-counter oral iron  tablet with vitamin C twice daily.  INTERVAL HISTORY: Jeremy [name] 63 y.o. male return to the clinic today for follow-up visit. Discussed the use of AI scribe software for clinical note transcription with the patient, who gave verbal consent to proceed.  History of Present Illness   Jeremy [name] is a 63 year old male with iron  deficiency anemia secondary to malabsorption who presents for evaluation and repeat blood work.  He has a history of iron  deficiency anemia secondary to malabsorption, status post bypass gastrogynostomy in March 2022. He was previously treated with iron  infusions and is currently taking over-the-counter ferrous sulfate  with vitamin C twice daily. He is consistent with his iron  supplement regimen. No fatigue or dizzy spells are reported.  He mentions a change in his magnesium  supplement from magnesium  oxide to magnesium  citrate, which he feels is helping without causing diarrhea.  He reports having fallen due to a 'drooping foot' or 'lazy toe,' attributed to nerve damage in his spine. An MRI was performed, and his orthopedic specialist has reviewed it, suggesting the nerve damage as the cause. He has had previous surgery in the area and there is discussion of potential further surgical intervention.       MEDICAL HISTORY: Past Medical History:  Diagnosis Date   Diabetes mellitus without complication (HCC)    GERD (gastroesophageal reflux disease)    Heart murmur    Hyperlipidemia    Hypertension    IDA (iron  deficiency anemia)    Rash of entire body 03/2016    Sleep apnea     ALLERGIES:  is allergic to cymbalta  [duloxetine  hcl], doxycycline , other, and robaxin  [methocarbamol ].  MEDICATIONS:  Current Outpatient Medications  Medication Sig Dispense Refill   Accu-Chek Softclix Lancets lancets Use to check blood sugar three times daily. 100 each 2   acetaminophen  (TYLENOL ) 500 MG tablet Take 1,000 mg by mouth in the morning and at bedtime.      amLODipine  (NORVASC ) 5 MG tablet Take 1 tablet (5 mg total) by mouth daily. 90 tablet 1   ascorbic acid (VITAMIN C) 500 MG tablet Take 500 mg by mouth 2 (two) times daily.     Blood Glucose Monitoring Suppl (ACCU-CHEK GUIDE) w/Device KIT Use to check blood sugar three times daily. 1 kit 0   Chlorphen-Phenyleph-APAP (CORICIDIN D COLD/FLU/SINUS) 2-5-325 MG TABS Take 1 tablet by mouth every 6 (six) hours as needed. (Patient not taking: Reported on 02/21/2024) 30 tablet 0   Cyanocobalamin  (VITAMIN B-12 PO) Take 2,500 mcg by mouth daily.     cyclobenzaprine  (FLEXERIL ) 10 MG tablet Take 1 tablet (10 mg total) by mouth 2 (two) times daily as needed for muscle spasms. 180 tablet 1   ferrous sulfate  (FEROSUL) 325 (65 FE) MG tablet Take 1 tablet (325 mg total) by mouth 2 (two) times daily with a meal. 180 tablet 1   fluticasone  (FLONASE ) 50 MCG/ACT nasal spray Place 1 spray into both nostrils daily as needed for allergies or rhinitis. 16 g 2   glipiZIDE  (GLUCOTROL  XL) 2.5 MG 24 hr tablet Take 1 tablet (2.5 mg total) by mouth daily  with breakfast. 90 tablet 1   lisinopril  (ZESTRIL ) 20 MG tablet Take 1 tablet (20 mg total) by mouth in the morning and at bedtime. 180 tablet 1   loratadine  (CLARITIN ) 10 MG tablet Take 10 mg by mouth daily.     Magnesium  Oxide 200 MG TABS Take 600 mg by mouth in the morning and at bedtime.     mometasone  (ASMANEX ) 220 MCG/ACT inhaler Inhale 2 puffs into the lungs 2 (two) times daily. (Patient not taking: Reported on 02/21/2024) 1 each 1   Multiple Vitamins-Minerals (MULTIVITAMIN WITH MINERALS)  tablet Take 1 tablet by mouth 2 (two) times daily.     ondansetron  (ZOFRAN ) 8 MG tablet Take 1 tablet (8 mg total) by mouth daily as needed for nausea or vomiting. 30 tablet 2   pantoprazole  (PROTONIX ) 40 MG tablet Take 1 tablet (40 mg total) by mouth 2 (two) times daily before a meal. 60 tablet 5   Potassium 99 MG TABS Take 198 mg by mouth 2 (two) times daily.     rOPINIRole  (REQUIP ) 1 MG tablet Take 1 tablet (1 mg total) by mouth at bedtime. 90 tablet 1   sildenafil  (VIAGRA ) 50 MG tablet Take 1 tablet (50 mg total) by mouth 1/2 hr before intercourse.  Limit use to 1 tab/24 hr 10 tablet 6   sodium chloride  1 g tablet Take 1 tablet (1 g total) by mouth daily. 30 tablet 3   sucralfate  (CARAFATE ) 1 GM/10ML suspension Take 10 mLs (1 g total) by mouth 2 (two) times daily. 600 mL 2   traMADol  (ULTRAM ) 50 MG tablet Take 2 tablets (100 mg total) by mouth every 12 (twelve) hours as needed (to fill on or after 05/20/2023). 120 tablet 2   No current facility-administered medications for this visit.    SURGICAL HISTORY:  Past Surgical History:  Procedure Laterality Date   BACK SURGERY  1993   lumbar   BALLOON DILATION N/A 04/25/2023   Procedure: BALLOON DILATION;  Surgeon: Cindie Carlin POUR, DO;  [facility];  Service: Endoscopy;  Laterality: N/A;   BIOPSY  02/07/2019   Procedure: BIOPSY;  Surgeon: Harvey Margo CROME, MD;  [facility];  Service: Endoscopy;;   BIOPSY  01/30/2022   Procedure: BIOPSY;  Surgeon: Cindie Carlin POUR, DO;  [facility];  Service: Endoscopy;;   BIOPSY  04/25/2023   Procedure: BIOPSY;  Surgeon: Cindie Carlin POUR, DO;  [facility];  Service: Endoscopy;;   CERVICAL SPINE SURGERY  2000   COLONOSCOPY N/A 02/07/2019   Dr. Harvey: External hemorrhoids next colonoscopy in 10 years   ESOPHAGOGASTRODUODENOSCOPY N/A 02/07/2019   Dr. Harvey: Barrett's esophagus without dysplasia chronic inactive gastritis but no H. pylori, small bowel biopsies  negative for celiac, acquired duodenal web likely due to prior PUD, nonbleeding duodenal diverticulum,   ESOPHAGOGASTRODUODENOSCOPY N/A 01/05/2020   Procedure: ESOPHAGOGASTRODUODENOSCOPY (EGD);  Surgeon: Harvey Margo CROME, MD;  [facility];  Service: Endoscopy;  Laterality: N/A;  10:30am   ESOPHAGOGASTRODUODENOSCOPY (EGD) WITH PROPOFOL  N/A 12/06/2020   Procedure: ESOPHAGOGASTRODUODENOSCOPY (EGD) WITH PROPOFOL ;  Surgeon: Cindie Carlin POUR, DO;  [facility];  Service: Endoscopy;  Laterality: N/A;  11:45am   ESOPHAGOGASTRODUODENOSCOPY (EGD) WITH PROPOFOL  N/A 12/22/2021   long-segment Barrett's s/p biopsy, gastric bypass with normal sized puch and intact staple line. GJ anastomosis with healthy mucosa. Gastritis.   ESOPHAGOGASTRODUODENOSCOPY (EGD) WITH PROPOFOL  N/A 01/30/2022   Procedure: ESOPHAGOGASTRODUODENOSCOPY (EGD) WITH PROPOFOL ;  Surgeon: Cindie Carlin POUR, DO;  [facility];  Service: Endoscopy;  Laterality: N/A;  11:00am   ESOPHAGOGASTRODUODENOSCOPY (EGD) WITH PROPOFOL  N/A 03/11/2023   Procedure: ESOPHAGOGASTRODUODENOSCOPY (EGD) WITH PROPOFOL ;  Surgeon: Leigh Elspeth SQUIBB, MD;  Location: [facility];  Service: Gastroenterology;  Laterality: N/A;   ESOPHAGOGASTRODUODENOSCOPY (EGD) WITH PROPOFOL  N/A 04/25/2023   Procedure: ESOPHAGOGASTRODUODENOSCOPY (EGD) WITH PROPOFOL ;  Surgeon: Cindie Carlin POUR, DO;  [facility];  Service: Endoscopy;  Laterality: N/A;  10:30 am, asa 3   GASTROJEJUNOSTOMY N/A 01/03/2021   Procedure: GASTROJEJUNOSTOMY;  Surgeon: Mavis Anes, MD;  Location: [facility];  Service: General;  Laterality: N/A;   HAND SURGERY     SAVORY DILATION N/A 01/05/2020   Procedure: SAVORY DILATION;  Surgeon: Harvey Margo CROME, MD;  [facility];  Service: Endoscopy;  Laterality: N/A;    REVIEW OF SYSTEMS:  A comprehensive review of systems was negative.   PHYSICAL EXAMINATION: General appearance: alert, cooperative, and no  distress Head: Normocephalic, without obvious abnormality, atraumatic Neck: no adenopathy, no JVD, supple, symmetrical, trachea midline, and thyroid not enlarged, symmetric, no tenderness/mass/nodules Lymph nodes: Cervical, supraclavicular, and axillary nodes normal. Resp: clear to auscultation bilaterally Back: symmetric, no curvature. ROM normal. No CVA tenderness. Cardio: regular rate and rhythm, S1, S2 normal, no murmur, click, rub or gallop GI: soft, non-tender; bowel sounds normal; no masses,  no organomegaly Extremities: extremities normal, atraumatic, no cyanosis or edema  ECOG PERFORMANCE STATUS: 1 - Symptomatic but completely ambulatory  Blood pressure 120/65, pulse (!) 58, temperature 98 F (36.7 C), temperature source Temporal, resp. rate 16, height 5' 10 (1.778 m), weight 209 lb (94.8 kg), SpO2 100%.  LABORATORY DATA: Lab Results  Component Value Date   WBC 6.2 04/15/2024   HGB 13.3 04/15/2024   HCT 38.3 (L) 04/15/2024   MCV 91.6 04/15/2024   PLT 179 04/15/2024      Chemistry      Component Value Date/Time   NA 135 02/08/2024 1637   K 4.6 02/08/2024 1637   CL 98 02/08/2024 1637   CO2 22 02/08/2024 1637   BUN 12 02/08/2024 1637   CREATININE 0.98 02/08/2024 1637   CREATININE 0.65 12/14/2022 0832   CREATININE 0.72 10/17/2016 1012      Component Value Date/Time   CALCIUM  10.2 02/08/2024 1637   ALKPHOS 102 09/07/2023 1220   AST 40 09/07/2023 1220   AST 24 12/14/2022 0832   ALT 56 (H) 09/07/2023 1220   ALT 21 12/14/2022 0832   BILITOT <0.2 09/07/2023 1220   BILITOT 0.2 (L) 12/14/2022 0832       RADIOGRAPHIC STUDIES: No results found.  ASSESSMENT AND PLAN: This is a very pleasant 63 years old white male with iron  deficiency anemia secondary to malabsorption secondary to gastrojejunostomy in March 2022. The patient was treated with iron  infusion with Venofer  300 mg IV for 3 doses.  Last dose was given in March 2024.  He is currently on oral iron  tablet with  ferrous sulfate  twice daily with orange juice. Assessment and Plan    Iron  deficiency anemia Iron  deficiency anemia secondary to malabsorption, status post bypass gastrogynostomy in March 2022. Hemoglobin is well-managed at 13.3 g/dL, consistent with previous results. No current symptoms of fatigue or dizziness. Awaiting results of iron  studies. 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 Previous surgery in the area, potential need for further surgical intervention discussed. - Follow up with orthopedic specialist as needed   The patient was advised to call immediately if he has any concerning symptoms in the interval. All questions were answered. The patient knows to call the clinic with any problems, questions or concerns. We can certainly see the patient much sooner if necessary. The total time spent in the appointment was 20 minutes.  Disclaimer: This note was dictated with voice recognition software. Similar sounding words can inadvertently be transcribed and may not be corrected upon review.

## 2024-04-21 ENCOUNTER — Other Ambulatory Visit: Payer: Self-pay

## 2024-04-21 ENCOUNTER — Encounter: Payer: Self-pay | Admitting: Physician Assistant

## 2024-04-25 ENCOUNTER — Other Ambulatory Visit: Payer: Self-pay

## 2024-04-28 ENCOUNTER — Other Ambulatory Visit: Payer: Self-pay

## 2024-05-06 DIAGNOSIS — M5451 Vertebrogenic low back pain: Secondary | ICD-10-CM | POA: Diagnosis not present

## 2024-05-06 DIAGNOSIS — M961 Postlaminectomy syndrome, not elsewhere classified: Secondary | ICD-10-CM | POA: Diagnosis not present

## 2024-05-06 DIAGNOSIS — M47816 Spondylosis without myelopathy or radiculopathy, lumbar region: Secondary | ICD-10-CM | POA: Diagnosis not present

## 2024-05-06 DIAGNOSIS — M5416 Radiculopathy, lumbar region: Secondary | ICD-10-CM | POA: Diagnosis not present

## 2024-05-06 DIAGNOSIS — Z79891 Long term (current) use of opiate analgesic: Secondary | ICD-10-CM | POA: Diagnosis not present

## 2024-05-06 DIAGNOSIS — G8929 Other chronic pain: Secondary | ICD-10-CM | POA: Diagnosis not present

## 2024-05-07 DIAGNOSIS — H903 Sensorineural hearing loss, bilateral: Secondary | ICD-10-CM | POA: Diagnosis not present

## 2024-05-08 ENCOUNTER — Other Ambulatory Visit: Payer: Self-pay

## 2024-05-09 ENCOUNTER — Encounter: Payer: Self-pay | Admitting: Physician Assistant

## 2024-05-09 ENCOUNTER — Telehealth: Payer: Self-pay | Admitting: Internal Medicine

## 2024-05-09 NOTE — Telephone Encounter (Signed)
 Called pt to confirm appt for 7/28 phone number not in service LVM on emerge. contact #

## 2024-05-12 ENCOUNTER — Ambulatory Visit: Attending: Internal Medicine | Admitting: Internal Medicine

## 2024-05-12 ENCOUNTER — Other Ambulatory Visit: Payer: Self-pay

## 2024-05-12 ENCOUNTER — Encounter: Payer: Self-pay | Admitting: Internal Medicine

## 2024-05-12 VITALS — BP 123/60 | HR 66 | Temp 97.6°F | Ht 70.0 in | Wt 215.0 lb

## 2024-05-12 DIAGNOSIS — M5136 Other intervertebral disc degeneration, lumbar region with discogenic back pain only: Secondary | ICD-10-CM | POA: Diagnosis not present

## 2024-05-12 DIAGNOSIS — E119 Type 2 diabetes mellitus without complications: Secondary | ICD-10-CM

## 2024-05-12 DIAGNOSIS — E1159 Type 2 diabetes mellitus with other circulatory complications: Secondary | ICD-10-CM

## 2024-05-12 DIAGNOSIS — E538 Deficiency of other specified B group vitamins: Secondary | ICD-10-CM | POA: Diagnosis not present

## 2024-05-12 DIAGNOSIS — K029 Dental caries, unspecified: Secondary | ICD-10-CM

## 2024-05-12 DIAGNOSIS — K2100 Gastro-esophageal reflux disease with esophagitis, without bleeding: Secondary | ICD-10-CM | POA: Diagnosis not present

## 2024-05-12 DIAGNOSIS — Z7984 Long term (current) use of oral hypoglycemic drugs: Secondary | ICD-10-CM | POA: Diagnosis not present

## 2024-05-12 DIAGNOSIS — I152 Hypertension secondary to endocrine disorders: Secondary | ICD-10-CM | POA: Diagnosis not present

## 2024-05-12 DIAGNOSIS — Z0289 Encounter for other administrative examinations: Secondary | ICD-10-CM

## 2024-05-12 DIAGNOSIS — M48061 Spinal stenosis, lumbar region without neurogenic claudication: Secondary | ICD-10-CM | POA: Diagnosis not present

## 2024-05-12 LAB — POCT GLYCOSYLATED HEMOGLOBIN (HGB A1C): HbA1c, POC (controlled diabetic range): 7.2 % — AB (ref 0.0–7.0)

## 2024-05-12 LAB — GLUCOSE, POCT (MANUAL RESULT ENTRY): POC Glucose: 124 mg/dL — AB (ref 70–99)

## 2024-05-12 MED ORDER — AMLODIPINE BESYLATE 5 MG PO TABS
5.0000 mg | ORAL_TABLET | Freq: Every day | ORAL | 1 refills | Status: DC
  Filled 2024-05-12 – 2024-08-10 (×2): qty 90, 90d supply, fill #0
  Filled 2024-10-23: qty 90, 90d supply, fill #1

## 2024-05-12 MED ORDER — GLIPIZIDE ER 5 MG PO TB24
5.0000 mg | ORAL_TABLET | Freq: Every day | ORAL | 1 refills | Status: DC
  Filled 2024-05-12: qty 90, 90d supply, fill #0
  Filled 2024-08-10: qty 90, 90d supply, fill #1

## 2024-05-12 MED ORDER — PANTOPRAZOLE SODIUM 40 MG PO TBEC
40.0000 mg | DELAYED_RELEASE_TABLET | Freq: Two times a day (BID) | ORAL | 5 refills | Status: AC
  Filled 2024-05-12 – 2024-05-23 (×2): qty 60, 30d supply, fill #0
  Filled 2024-07-07: qty 60, 30d supply, fill #1
  Filled 2024-08-10: qty 60, 30d supply, fill #2
  Filled 2024-10-23: qty 60, 30d supply, fill #3
  Filled ????-??-??: fill #4

## 2024-05-12 NOTE — Progress Notes (Signed)
 Patient ID: Jeremy [name], male    DOB: 1960/10/21  MRN: [id number]  CC: Diabetes (DM f/u. Med refill. Raguel of easily bruising )   Subjective: Jeremy [name] is a 63 y.o. male who presents for chronic ds management. His concerns today include:  Pt with hx of DM, HL, HTN,GERD, IDA (Barrett's esophagus and moderate gastritis thought to be NSAID induced on EGD; iron  infusions by Dr. 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  Myoview  neg 02/2021   Discussed the use of AI scribe software for clinical note transcription with the patient, who gave verbal consent to proceed.  History of Present Illness Jeremy [name] is a 63 year old male with diabetes and hypertension who presents for follow-up.  DM: Results for orders placed or performed in visit on 05/12/24  POCT glucose (manual entry)   Collection Time: 05/12/24  9:42 AM  Result Value Ref Range   POC Glucose 124 (A) 70 - 99 mg/dl  POCT glycosylated hemoglobin (Hb A1C)   Collection Time: 05/12/24  9:45 AM  Result Value Ref Range   Hemoglobin A1C     HbA1c POC (<> result, manual entry)     HbA1c, POC (prediabetic range)     HbA1c, POC (controlled diabetic range) 7.2 (A) 0.0 - 7.0 %   *Note: Due to a large number of results and/or encounters for the requested time period, some results have not been displayed. A complete set of results can be found in Results Review.  His A1c has increased from 5.8 in March to 7.2 currently. He is on glipizide  extended release 2.5 mg once daily but has not been checking his blood sugars regularly despite having a device at home. His blood sugar today was 124. He eats 'whatever I want' and does not avoid sugary drinks or snacks. He experiences hypoglycemia if he does not eat before bed, often consuming sugary snacks like oatmeal cookies with peanut butter and banana,  or popsicles. He occasionally eats watermelon to rehydrate when outside. He has missed taking his glipizide  on some days, which he believes may have affected his A1c.  HTN: His blood pressure today was 123/60, and a recent home reading was 131/68. He is on lisinopril  20 mg twice daily and amlodipine  5 mg daily.  He has been seeing [facility] pain management for his back pain.  He also sees sports medicine Dr. Burnetta.  MRI done in June showed advanced degenerative lumbar spondylosis with multilevel disc disease, foraminal and spinal stenosis.  Had ESI which were helpful.  They were wanting to do nerve ablation but patient is not interested in having that done.  He reports that his left foot drop, which has been present for a long time, has gotten worse over the past three to four months.  Physical therapy has slightly improved his balance.  He is on tramadol  for osteoarthritis in the hips and chronic lower back pain.  He tolerates the tramadol  well.  No significant side effects.  Last took tramadol  last evening.  Due for updated controlled substance prescribing agreement  He is scheduled to see an oral surgeon for potential tooth extractions due to cavities and a split tooth, which he attributes to CPAP use. He plans to get dentures above and partial below.  He takes B12 supplements for a deficiency and has recently changed his magnesium  supplement  and started beet pills as supplements.  Requests refill on pantoprazole  for acid reflux.    Patient Active Problem List   Diagnosis Date Noted   Seasonal allergic rhinitis due to pollen 01/17/2024   Sneezing 01/17/2024   Viral respiratory illness 01/17/2024   COVID-19 01/17/2024   SIADH (syndrome of inappropriate ADH production) (HCC) 09/07/2023   Anastomotic stricture of gastrojejunostomy 04/07/2023   PUD (peptic ulcer disease) 04/02/2023   Anemia 04/02/2023   Marginal ulcer 03/12/2023   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   Coffee ground  emesis 03/11/2023   NSAID long-term use 03/11/2023   Anemia, posthemorrhagic, acute 03/11/2023   Upper GI bleeding 03/10/2023   Hypertension associated with type 2 diabetes mellitus (HCC) 01/15/2023   Constipation 10/25/2022   Upper abdominal pain 07/06/2022   Nausea and vomiting 07/06/2022   Hyponatremia 06/23/2021   Low serum vitamin B12 03/23/2021   Vitamin D deficiency 03/23/2021   S/P bypass gastrojejunostomy 01/03/2021   Gastric outlet obstruction    Duodenal stricture    Primary osteoarthritis of both hips 11/15/2020   Aortic atherosclerosis (HCC) 08/30/2020   Coronary artery calcification 08/30/2020   Bilateral carpal tunnel syndrome 06/07/2020   Grief reaction 06/07/2020   Pyloric stenosis in adult 02/05/2020   Congenital hypertrophic pyloric stenosis    Duodenal web    Barrett's esophagus without dysplasia 05/19/2019   OSA (obstructive sleep apnea) 04/30/2019   Iron  deficiency anemia 11/25/2018   Phimosis of penis 11/19/2018   Esophageal dysphagia 08/19/2018   Benign prostatic hyperplasia with post-void dribbling 01/31/2018   Absence of bladder continence 01/31/2018   Obesity (BMI 30-39.9) 11/05/2017   GERD (gastroesophageal reflux disease) 06/03/2017   Chronic pain of both knees 06/03/2017   Chronic lower back pain 06/03/2017   Type 2 diabetes mellitus without complication, without long-term current use of insulin  (HCC) 04/12/2016   Essential hypertension 04/12/2016     Current Outpatient Medications on File Prior to Visit  Medication Sig Dispense Refill   Accu-Chek Softclix Lancets lancets Use to check blood sugar three times daily. 100 each 2   acetaminophen  (TYLENOL ) 500 MG tablet Take 1,000 mg by mouth in the morning and at bedtime.      ascorbic acid (VITAMIN C) 500 MG tablet Take 500 mg by mouth 2 (two) times daily.     Blood Glucose Monitoring Suppl (ACCU-CHEK GUIDE) w/Device KIT Use to check blood sugar three times daily. 1 kit 0   Cyanocobalamin  (VITAMIN  B-12 PO) Take 2,500 mcg by mouth daily.     cyclobenzaprine  (FLEXERIL ) 10 MG tablet Take 1 tablet (10 mg total) by mouth 2 (two) times daily as needed for muscle spasms. 180 tablet 1   ferrous sulfate  (FEROSUL) 325 (65 FE) MG tablet Take 1 tablet (325 mg total) by mouth 2 (two) times daily with a meal. 180 tablet 1   fluticasone  (FLONASE ) 50 MCG/ACT nasal spray Place 1 spray into both nostrils daily as needed for allergies or rhinitis. 16 g 2   lisinopril  (ZESTRIL ) 20 MG tablet Take 1 tablet (20 mg total) by mouth in the morning and at bedtime. 180 tablet 1   loratadine  (CLARITIN ) 10 MG tablet Take 10 mg by mouth daily.     Magnesium  Oxide 200 MG TABS Take 600 mg by mouth in the morning and at bedtime.     mometasone  (ASMANEX ) 220 MCG/ACT inhaler Inhale 2 puffs into the lungs 2 (two) times daily. 1 each 1   Multiple Vitamins-Minerals (MULTIVITAMIN WITH MINERALS) tablet Take 1  tablet by mouth 2 (two) times daily.     ondansetron  (ZOFRAN ) 8 MG tablet Take 1 tablet (8 mg total) by mouth daily as needed for nausea or vomiting. 30 tablet 2   Potassium 99 MG TABS Take 198 mg by mouth 2 (two) times daily.     rOPINIRole  (REQUIP ) 1 MG tablet Take 1 tablet (1 mg total) by mouth at bedtime. 90 tablet 1   sildenafil  (VIAGRA ) 50 MG tablet Take 1 tablet (50 mg total) by mouth 1/2 hr before intercourse.  Limit use to 1 tab/24 hr 10 tablet 6   sodium chloride  1 g tablet Take 1 tablet (1 g total) by mouth daily. 30 tablet 3   sucralfate  (CARAFATE ) 1 GM/10ML suspension Take 10 mLs (1 g total) by mouth 2 (two) times daily. 600 mL 2   traMADol  (ULTRAM ) 50 MG tablet Take 2 tablets (100 mg total) by mouth every 12 (twelve) hours as needed (to fill on or after 05/20/2023). 120 tablet 2   Chlorphen-Phenyleph-APAP (CORICIDIN D COLD/FLU/SINUS) 2-5-325 MG TABS Take 1 tablet by mouth every 6 (six) hours as needed. (Patient not taking: Reported on 05/12/2024) 30 tablet 0   No current facility-administered medications on file  prior to visit.    Allergies  Allergen Reactions   Cymbalta  [Duloxetine  Hcl] Other (See Comments)    Caused anorgasmia   Doxycycline      Unknown   Other Nausea And Vomiting    Mayonnaise mustard ketchup   Robaxin  [Methocarbamol ] Other (See Comments)    Had hiccups x 4 hours after taking    Social History   Socioeconomic History   Marital status: Significant Other    Spouse name: Not on file   Number of children: Not on file   Years of education: Not on file   Highest education level: Not on file  Occupational History   Occupation: Product/process development scientist  Tobacco Use   Smoking status: Former    Current packs/day: 0.00    Types: Cigarettes    Quit date: 10/16/1988    Years since quitting: 35.5   Smokeless tobacco: Never  Vaping Use   Vaping status: Never Used  Substance and Sexual Activity   Alcohol use: No   Drug use: Yes    Comment: rare   Sexual activity: Not on file  Other Topics Concern   Not on file  Social History Narrative   Volunteers as IT sales professional   Social Drivers of Health   Financial Resource Strain: Medium Risk (02/21/2024)   Overall Financial Resource Strain (CARDIA)    Difficulty of Paying Living Expenses: Somewhat hard  Food Insecurity: Food Insecurity Present (02/21/2024)   Hunger Vital Sign    Worried About Running Out of Food in the Last Year: Never true    Ran Out of Food in the Last Year: Sometimes true  Transportation Needs: No Transportation Needs (02/21/2024)   PRAPARE - Administrator, Civil Service (Medical): No    Lack of Transportation (Non-Medical): No  Physical Activity: Insufficiently Active (02/21/2024)   Exercise Vital Sign    Days of Exercise per Week: 1 day    Minutes of Exercise per Session: 20 min  Stress: No Stress Concern Present (02/21/2024)   [employer] of Occupational Health - Occupational Stress Questionnaire    Feeling of Stress : Only a little  Social Connections: Socially Isolated (02/21/2024)   Social  Connection and Isolation Panel    Frequency of Communication with Friends and Family: Once a week  Frequency of Social Gatherings with Friends and Family: Once a week    Attends Religious Services: More than 4 times per year    Active Member of [employer] or Organizations: No    Attends Banker Meetings: Never    Marital Status: Separated  Intimate Partner Violence: Not At Risk (02/21/2024)   Humiliation, Afraid, Rape, and Kick questionnaire    Fear of Current or Ex-Partner: No    Emotionally Abused: No    Physically Abused: No    Sexually Abused: No    Family History  Problem Relation Age of Onset   Breast cancer Mother    Pancreatic cancer Mother    CAD Father    Colon cancer Paternal Grandfather     Past Surgical History:  Procedure Laterality Date   BACK SURGERY  1993   lumbar   BALLOON DILATION N/A 04/25/2023   Procedure: BALLOON DILATION;  Surgeon: Cindie Carlin POUR, DO;  Location: [facility];  Service: Endoscopy;  Laterality: N/A;   BIOPSY  02/07/2019   Procedure: BIOPSY;  Surgeon: Harvey Margo CROME, MD;  Location: [facility];  Service: Endoscopy;;   BIOPSY  01/30/2022   Procedure: BIOPSY;  Surgeon: Cindie Carlin POUR, DO;  Location: [facility];  Service: Endoscopy;;   BIOPSY  04/25/2023   Procedure: BIOPSY;  Surgeon: Cindie Carlin POUR, DO;  Location: [facility];  Service: Endoscopy;;   CERVICAL SPINE SURGERY  2000   COLONOSCOPY N/A 02/07/2019   Dr. Harvey: External hemorrhoids next colonoscopy in 10 years   ESOPHAGOGASTRODUODENOSCOPY N/A 02/07/2019   Dr. Harvey: Barrett's esophagus without dysplasia chronic inactive gastritis but no H. 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, nonbleeding duodenal diverticulum,   ESOPHAGOGASTRODUODENOSCOPY N/A 01/05/2020   Procedure: ESOPHAGOGASTRODUODENOSCOPY (EGD);  Surgeon: Harvey Margo CROME, MD;  Location: [facility];  Service: Endoscopy;  Laterality: N/A;   10:30am   ESOPHAGOGASTRODUODENOSCOPY (EGD) WITH PROPOFOL  N/A 12/06/2020   Procedure: ESOPHAGOGASTRODUODENOSCOPY (EGD) WITH PROPOFOL ;  Surgeon: Cindie Carlin POUR, DO;  [facility]: [facility];  Service: Endoscopy;  Laterality: N/A;  11:45am   ESOPHAGOGASTRODUODENOSCOPY (EGD) WITH PROPOFOL  N/A 12/22/2021   long-segment Barrett's s/p biopsy, gastric bypass with normal sized puch and intact staple line. GJ anastomosis with healthy mucosa. Gastritis.   ESOPHAGOGASTRODUODENOSCOPY (EGD) WITH PROPOFOL  N/A 01/30/2022   Procedure: ESOPHAGOGASTRODUODENOSCOPY (EGD) WITH PROPOFOL ;  Surgeon: Cindie Carlin POUR, DO;  [facility]: [facility];  Service: Endoscopy;  Laterality: N/A;  11:00am   ESOPHAGOGASTRODUODENOSCOPY (EGD) WITH PROPOFOL  N/A 03/11/2023   Procedure: ESOPHAGOGASTRODUODENOSCOPY (EGD) WITH PROPOFOL ;  Surgeon: Leigh Elspeth SQUIBB, MD;  [facility];  Service: Gastroenterology;  Laterality: N/A;   ESOPHAGOGASTRODUODENOSCOPY (EGD) WITH PROPOFOL  N/A 04/25/2023   Procedure: ESOPHAGOGASTRODUODENOSCOPY (EGD) WITH PROPOFOL ;  Surgeon: Cindie Carlin POUR, DO;  [facility];  Service: Endoscopy;  Laterality: N/A;  10:30 am, asa 3   GASTROJEJUNOSTOMY N/A 01/03/2021   Procedure: GASTROJEJUNOSTOMY;  Surgeon: Mavis Anes, MD;  Location: [facility];  Service: General;  Laterality: N/A;   HAND SURGERY     SAVORY DILATION N/A 01/05/2020   Procedure: SAVORY DILATION;  Surgeon: Harvey Margo CROME, MD;  Location: [facility];  Service: Endoscopy;  Laterality: N/A;    ROS: Review of Systems Negative except as stated above  PHYSICAL EXAM: BP 123/60 (BP Location: Left Arm, Patient Position: Sitting, Cuff Size: Large)   Pulse 66   Temp 97.6 F (36.4 C) (Oral)   Ht 5' 10 (  1.778 m)   Wt 215 lb (97.5 kg)   SpO2 97%   BMI 30.85 kg/m   Wt Readings from Last 3 Encounters:  05/12/24 215 lb (97.5 kg)  04/15/24 209 lb (94.8 kg)  02/21/24 210 lb (95.3 kg)    Physical Exam General appearance - alert,  well appearing, and in no distress Mental status - normal mood, behavior, speech, dress, motor activity, and thought processes Mouth: Patient has several cavities with rotted teeth broken off in the gum in the upper jaw and 1 cavity noted in the lower jaw frontal. Neck - supple, no significant adenopathy Chest - clear to auscultation, no wheezes, rales or rhonchi, symmetric air entry Heart - normal rate, regular rhythm, normal S1, S2, no murmurs, rubs, clicks or gallops Extremities - peripheral pulses normal, no pedal edema, no clubbing or cyanosis Neuro: Power in both lower extremities proximally and distally including the feet 5/5 bilaterally.  Gait is stable.  No flapping of the foot noted on the left side when he walks.    Latest Ref Rng & Units 02/08/2024    4:37 PM 01/29/2024   11:23 AM 09/07/2023   12:20 PM  CMP  Glucose 70 - 99 mg/dL 892  840  92   BUN 8 - 27 mg/dL 12  18  15    Creatinine 0.76 - 1.27 mg/dL 9.01  9.20  9.21   Sodium 134 - 144 mmol/L 135  133  140   Potassium 3.5 - 5.2 mmol/L 4.6  4.3  4.3   Chloride 96 - 106 mmol/L 98  96  102   CO2 20 - 29 mmol/L 22  22  23    Calcium  8.6 - 10.2 mg/dL 89.7  9.8  9.6   Total Protein 6.0 - 8.5 g/dL   6.8   Total Bilirubin 0.0 - 1.2 mg/dL   <9.7   Alkaline Phos 44 - 121 IU/L   102   AST 0 - 40 IU/L   40   ALT 0 - 44 IU/L   56    Lipid Panel     Component Value Date/Time   CHOL 116 09/07/2023 1220   TRIG 141 09/07/2023 1220   HDL 44 09/07/2023 1220   CHOLHDL 2.6 09/07/2023 1220   CHOLHDL 4.8 10/17/2016 1012   VLDL NOT CALC 10/17/2016 1012   LDLCALC 48 09/07/2023 1220    CBC    Component Value Date/Time   WBC 6.2 04/15/2024 1028   WBC 5.1 04/23/2023 1039   RBC 4.18 (L) 04/15/2024 1028   HGB 13.3 04/15/2024 1028   HGB 10.0 (L) 05/17/2021 1657   HCT 38.3 (L) 04/15/2024 1028   HCT 31.6 (L) 05/17/2021 1657   PLT 179 04/15/2024 1028   PLT 236 05/17/2021 1657   MCV 91.6 04/15/2024 1028   MCV 89 05/17/2021 1657   MCH  31.8 04/15/2024 1028   MCHC 34.7 04/15/2024 1028   RDW 12.7 04/15/2024 1028   RDW 16.4 (H) 05/17/2021 1657   LYMPHSABS 1.3 04/15/2024 1028   LYMPHSABS 2.0 08/13/2019 1617   MONOABS 0.6 04/15/2024 1028   EOSABS 0.5 04/15/2024 1028   EOSABS 0.4 08/13/2019 1617   BASOSABS 0.1 04/15/2024 1028   BASOSABS 0.1 08/13/2019 1617    ASSESSMENT AND PLAN: 1. Type 2 diabetes mellitus with other circulatory complication, without long-term current use of insulin  (HCC) (Primary) Not at goal due to dietary indiscretions.  Dietary counseling given.  Increase extended release glipizide  to 5 mg daily. - POCT glycosylated  hemoglobin (Hb A1C) - POCT glucose (manual entry)  2. Diabetes mellitus treated with oral medication (HCC) See #1 above. - glipiZIDE  (GLUCOTROL  XL) 5 MG 24 hr tablet; Take 1 tablet (5 mg total) by mouth daily with breakfast.  Dispense: 90 tablet; Refill: 1  3. Hypertension associated with type 2 diabetes mellitus (HCC) At goal.  Continue Norvasc  5 mg daily and lisinopril  20 mg twice a day - amLODipine  (NORVASC ) 5 MG tablet; Take 1 tablet (5 mg total) by mouth daily.  Dispense: 90 tablet; Refill: 1 - Basic Metabolic Panel  4. Gastroesophageal reflux disease with esophagitis, unspecified whether hemorrhage - pantoprazole  (PROTONIX ) 40 MG tablet; Take 1 tablet (40 mg total) by mouth 2 (two) times daily before a meal.  Dispense: 60 tablet; Refill: 5  5. B12 deficiency Will recheck B12 level - Vitamin B12  6. Degeneration of intervertebral disc of lumbar region with discogenic back pain I went over controlled substance prescribing agreement with him.  He is agreeable to it.  He was unable to produce a urine for urine drug screen so we did a serum. - Drug Screen 10 W/Conf, Se  7. Spinal stenosis of lumbar region, unspecified whether neurogenic claudication present - Drug Screen 10 W/Conf, Se  8. Dental cavities Keep appointment with dentist tomorrow.  He is medically cleared from my  standpoint to have these extractions done.  9.  Controlled substance prescribing agreement signed  Patient was given the opportunity to ask questions.  Patient verbalized understanding of the plan and was able to repeat key elements of the plan.   This documentation was completed using Paediatric nurse.  Any transcriptional errors are unintentional.  Orders Placed This Encounter  Procedures   Vitamin B12   Basic Metabolic Panel   Drug Monitor,Opiates,Screen,Urine   Urine drug profile, 9 drugs (performed at [facility])   Drug Screen 10 W/Conf, Se   POCT glycosylated hemoglobin (Hb A1C)   POCT glucose (manual entry)     Requested Prescriptions   Signed Prescriptions Disp Refills   amLODipine  (NORVASC ) 5 MG tablet 90 tablet 1    Sig: Take 1 tablet (5 mg total) by mouth daily.   glipiZIDE  (GLUCOTROL  XL) 5 MG 24 hr tablet 90 tablet 1    Sig: Take 1 tablet (5 mg total) by mouth daily with breakfast.   pantoprazole  (PROTONIX ) 40 MG tablet 60 tablet 5    Sig: Take 1 tablet (40 mg total) by mouth 2 (two) times daily before a meal.    Return in about 4 months (around 09/12/2024).  Barnie Louder, MD, FACP

## 2024-05-13 ENCOUNTER — Ambulatory Visit: Payer: Self-pay | Admitting: Internal Medicine

## 2024-05-13 LAB — DRUG PROFILE, UR, 9 DRUGS (LABCORP)
Amphetamines, Urine: NEGATIVE ng/mL
Barbiturate Quant, Ur: NEGATIVE ng/mL
Benzodiazepine Quant, Ur: NEGATIVE ng/mL
Cannabinoid Quant, Ur: NEGATIVE ng/mL
Cocaine (Metab.): NEGATIVE ng/mL
Creatinine, Urine: 69.8 mg/dL (ref 20.0–300.0)
Methadone Screen, Urine: NEGATIVE ng/mL
Nitrite Urine, Quantitative: NEGATIVE ug/mL
OPIATE SCREEN URINE: NEGATIVE ng/mL
PCP Quant, Ur: NEGATIVE ng/mL
Propoxyphene: NEGATIVE ng/mL
pH, Urine: 6.6 (ref 4.5–8.9)

## 2024-05-15 ENCOUNTER — Ambulatory Visit: Admitting: Orthopedic Surgery

## 2024-05-15 ENCOUNTER — Other Ambulatory Visit (INDEPENDENT_AMBULATORY_CARE_PROVIDER_SITE_OTHER): Payer: Self-pay

## 2024-05-15 VITALS — BP 121/70 | HR 62 | Ht 70.0 in | Wt 215.0 lb

## 2024-05-15 DIAGNOSIS — M545 Low back pain, unspecified: Secondary | ICD-10-CM

## 2024-05-15 LAB — BASIC METABOLIC PANEL WITH GFR
BUN/Creatinine Ratio: 21 (ref 10–24)
BUN: 15 mg/dL (ref 8–27)
CO2: 22 mmol/L (ref 20–29)
Calcium: 9.2 mg/dL (ref 8.6–10.2)
Chloride: 100 mmol/L (ref 96–106)
Creatinine, Ser: 0.72 mg/dL — ABNORMAL LOW (ref 0.76–1.27)
Glucose: 160 mg/dL — ABNORMAL HIGH (ref 70–99)
Potassium: 4.1 mmol/L (ref 3.5–5.2)
Sodium: 136 mmol/L (ref 134–144)
eGFR: 105 mL/min/1.73 (ref >59)

## 2024-05-15 LAB — DRUG SCREEN 10 W/CONF, SERUM

## 2024-05-15 LAB — VITAMIN B12: Vitamin B-12: 1166 pg/mL (ref 232–1245)

## 2024-05-15 NOTE — Progress Notes (Signed)
 Orthopedic Spine Surgery Office Note  Assessment: Patient is a 63 y.o. male with low back pain with bilateral foot and toe dorsiflexion weakness.  Has significant foraminal stenosis at L4/5 and L5/S1   Plan: -Patient has neurologic weakness with dorsiflexion at the foot and great toe.  He does have foraminal stenosis at L4/5 and L5/S1 that could cause L4 and L5 weakness.  I explained that there is not a medication that would necessarily help with his weakness.  He has recently done physical therapy so I told him to keep working on the exercises and stay active.  His other option would be surgical to take pressure off the nerves.  He was not interested in surgery -I told him that if he changes his mind or develops new symptoms, he should come back to the office and we can reevaluate things -Patient should return to office on an as needed basis   Patient expressed understanding of the plan and all questions were answered to the patient's satisfaction.   ___________________________________________________________________________   History:  Patient is a 63 y.o. male who presents today for lumbar spine.  Patient has history of chronic low back pain but has noticed worsening within the last 6 months.  He is also noticed weakness with his dorsiflexion at the foot which has given him difficulty with running.  He is also sometimes tripped over his foot as a result of this weakness.  He has noticed this foot and ankle weakness for about 6 months as well.  There is no trauma or injury that preceded the onset of the worsening symptoms.  He has done physical therapy and did not notice any significant improvement with that.  Weakness: Yes, feels his foot and ankle are weaker on the right and left.  No other weakness noted Symptoms of imbalance: Has tripped over his foot as a result of the weakness but no other feelings of imbalance or unsteadiness Paresthesias and numbness: Denies Bowel or bladder  incontinence: Yes, has had both urinary and bowel incontinence since his bypass surgery.  They are not constant but periodic in nature.  No recent changes in the symptoms Saddle anesthesia: Denies  Treatments tried: PT, Tylenol , Aleve , Flexeril , tramadol , lumbar steroid injections  Review of systems: Denies fevers and chills, night sweats, unexplained weight loss, history of cancer, pain that wakes him at night  Past medical history: DM (last A1c was 7.2 on 05/12/2024) GERD HLD HTN OSA  Allergies: cymbalta , doxycycline , robaxin   Past surgical history:  Savory dilation L4/5 microdiscectomy Gatrojejunostomy   Social history: Denies use of nicotine product (smoking, vaping, patches, smokeless) Alcohol use: denies Denies recreational drug use   Physical Exam:  BMI of 30.9  General: no acute distress, appears stated age Neurologic: alert, answering questions appropriately, following commands Respiratory: unlabored breathing on room air, symmetric chest rise Psychiatric: appropriate affect, normal cadence to speech   MSK (spine):  -Strength exam      Left  Right EHL    3/5  4/5 TA    4/5  4/5 GSC    5/5  5/5 Knee extension  5/5  5/5 Hip flexion   5/5  5/5  -Sensory exam    Sensation intact to light touch in L3-S1 nerve distributions of bilateral lower extremities  -Achilles DTR: 2/4 on the left, 2/4 on the right -Patellar tendon DTR: 2/4 on the left, 2/4 on the right  -Straight leg raise: Negative bilaterally -Clonus: no beats bilaterally  -Left hip exam: No pain through  range of motion -Right hip exam: No pain through range of motion  Imaging: XRs of the lumbar spine from 05/15/2024 were independently reviewed and interpreted, showing degenerative scoliosis 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  Disc height loss at L3/4, L4/5, L5/S1.  Small anterior osteophyte formation seen at L2/3.  No evidence of instability on flexion/extension views.  No fracture or dislocation  seen.  MRI of the lumbar spine from 04/11/2024 was independently reviewed and interpreted, showing central stenosis at L1/2.  Central stenosis at L central, lateral recess, and right-sided foraminal stenosis at L3/4.  Bilateral foraminal stenosis at L4/5, worse on the right.  Bilateral foraminal stenosis at L5/S1.   Patient name: Jeremy [name] Patient MRN: [id number] Date of visit: 05/15/24

## 2024-05-23 ENCOUNTER — Encounter: Payer: Self-pay | Admitting: Physician Assistant

## 2024-05-23 ENCOUNTER — Other Ambulatory Visit: Payer: Self-pay | Admitting: Internal Medicine

## 2024-05-23 ENCOUNTER — Other Ambulatory Visit: Payer: Self-pay

## 2024-05-23 MED ORDER — ONDANSETRON HCL 8 MG PO TABS
8.0000 mg | ORAL_TABLET | Freq: Every day | ORAL | 2 refills | Status: AC | PRN
  Filled 2024-05-23: qty 30, 30d supply, fill #0

## 2024-05-26 ENCOUNTER — Other Ambulatory Visit: Payer: Self-pay

## 2024-05-26 ENCOUNTER — Encounter: Payer: Self-pay | Admitting: Orthopedic Surgery

## 2024-05-28 ENCOUNTER — Other Ambulatory Visit: Payer: Self-pay

## 2024-06-05 ENCOUNTER — Other Ambulatory Visit: Payer: Self-pay

## 2024-06-05 DIAGNOSIS — M961 Postlaminectomy syndrome, not elsewhere classified: Secondary | ICD-10-CM | POA: Diagnosis not present

## 2024-06-05 DIAGNOSIS — M5451 Vertebrogenic low back pain: Secondary | ICD-10-CM | POA: Diagnosis not present

## 2024-06-05 DIAGNOSIS — M47816 Spondylosis without myelopathy or radiculopathy, lumbar region: Secondary | ICD-10-CM | POA: Diagnosis not present

## 2024-06-05 DIAGNOSIS — M5416 Radiculopathy, lumbar region: Secondary | ICD-10-CM | POA: Diagnosis not present

## 2024-06-05 DIAGNOSIS — G8929 Other chronic pain: Secondary | ICD-10-CM | POA: Diagnosis not present

## 2024-06-05 DIAGNOSIS — Z79891 Long term (current) use of opiate analgesic: Secondary | ICD-10-CM | POA: Diagnosis not present

## 2024-06-08 ENCOUNTER — Other Ambulatory Visit: Payer: Self-pay | Admitting: Internal Medicine

## 2024-06-08 DIAGNOSIS — J302 Other seasonal allergic rhinitis: Secondary | ICD-10-CM

## 2024-06-10 ENCOUNTER — Other Ambulatory Visit: Payer: Self-pay

## 2024-06-10 MED ORDER — FLUTICASONE PROPIONATE 50 MCG/ACT NA SUSP
1.0000 | Freq: Every day | NASAL | 2 refills | Status: AC | PRN
  Filled 2024-06-10: qty 16, 60d supply, fill #0

## 2024-06-11 ENCOUNTER — Other Ambulatory Visit: Payer: Self-pay

## 2024-06-13 ENCOUNTER — Other Ambulatory Visit: Payer: Self-pay

## 2024-07-02 ENCOUNTER — Encounter: Payer: Self-pay | Admitting: Internal Medicine

## 2024-07-07 ENCOUNTER — Other Ambulatory Visit: Payer: Self-pay

## 2024-07-07 ENCOUNTER — Other Ambulatory Visit: Payer: Self-pay | Admitting: Internal Medicine

## 2024-07-07 ENCOUNTER — Encounter: Payer: Self-pay | Admitting: Physician Assistant

## 2024-07-08 ENCOUNTER — Other Ambulatory Visit: Payer: Self-pay

## 2024-07-08 ENCOUNTER — Encounter: Payer: Self-pay | Admitting: Physician Assistant

## 2024-07-08 MED ORDER — SODIUM CHLORIDE 1 G PO TABS
1.0000 g | ORAL_TABLET | Freq: Every day | ORAL | 3 refills | Status: AC
  Filled 2024-07-08: qty 30, 30d supply, fill #0
  Filled 2024-08-10: qty 30, 30d supply, fill #1
  Filled 2024-09-07: qty 30, 30d supply, fill #2
  Filled 2024-10-23: qty 30, 30d supply, fill #3

## 2024-07-08 NOTE — Telephone Encounter (Signed)
 Requested medication (s) are due for refill today: yes  Requested medication (s) are on the active medication list: yes  Last refill:  02/13/24  Future visit scheduled: {Yes  Notes to clinic:   Medication not assigned to a protocol, review manually.      Requested Prescriptions  Pending Prescriptions Disp Refills   sodium chloride  1 g tablet 30 tablet 3    Sig: Take 1 tablet (1 g total) by mouth daily.     Off-Protocol Failed - 07/08/2024  9:15 AM      Failed - Medication not assigned to a protocol, review manually.      Passed - Valid encounter within last 12 months    Recent Outpatient Visits           1 month ago Type 2 diabetes mellitus with other circulatory complication, without long-term current use of insulin  (HCC)   [facility] [place]. [facility] Vicci Barnie NOVAK, MD   4 months ago Rib pain on right side   [facility] [place] [facility] Of [place]. [facility] Vicci Barnie B, MD   5 months ago Diabetes mellitus treated with oral medication [facility]   [facility] Of [place]. [facility] Vicci Barnie B, MD   10 months ago Type 2 diabetes mellitus with other circulatory complication, without long-term current use of insulin  [facility])   [facility] Of [place]. [facility] Vicci Barnie NOVAK, MD   1 year ago Chronic left shoulder pain   [facility] [place] [facility] [place]. [facility] Vicci Barnie NOVAK, MD       Future Appointments             In 2 months Vicci Barnie NOVAK, MD [facility] [place] - [facility] [place]. [facility] [place]

## 2024-07-11 ENCOUNTER — Other Ambulatory Visit: Payer: Self-pay

## 2024-07-17 ENCOUNTER — Emergency Department (HOSPITAL_BASED_OUTPATIENT_CLINIC_OR_DEPARTMENT_OTHER)

## 2024-07-17 ENCOUNTER — Encounter (HOSPITAL_BASED_OUTPATIENT_CLINIC_OR_DEPARTMENT_OTHER): Payer: Self-pay

## 2024-07-17 ENCOUNTER — Emergency Department (HOSPITAL_BASED_OUTPATIENT_CLINIC_OR_DEPARTMENT_OTHER)
Admission: EM | Admit: 2024-07-17 | Discharge: 2024-07-18 | Disposition: A | Discharge status: Home / Self Care | Attending: Emergency Medicine | Admitting: Emergency Medicine

## 2024-07-17 ENCOUNTER — Other Ambulatory Visit: Payer: Self-pay

## 2024-07-17 DIAGNOSIS — R0789 Other chest pain: Secondary | ICD-10-CM | POA: Insufficient documentation

## 2024-07-17 DIAGNOSIS — E119 Type 2 diabetes mellitus without complications: Secondary | ICD-10-CM | POA: Insufficient documentation

## 2024-07-17 DIAGNOSIS — S20211A Contusion of right front wall of thorax, initial encounter: Secondary | ICD-10-CM | POA: Diagnosis not present

## 2024-07-17 DIAGNOSIS — R519 Headache, unspecified: Secondary | ICD-10-CM | POA: Diagnosis present

## 2024-07-17 DIAGNOSIS — M25562 Pain in left knee: Secondary | ICD-10-CM | POA: Diagnosis not present

## 2024-07-17 DIAGNOSIS — I10 Essential (primary) hypertension: Secondary | ICD-10-CM | POA: Insufficient documentation

## 2024-07-17 DIAGNOSIS — S8002XA Contusion of left knee, initial encounter: Secondary | ICD-10-CM

## 2024-07-17 DIAGNOSIS — W11XXXA Fall on and from ladder, initial encounter: Secondary | ICD-10-CM | POA: Diagnosis not present

## 2024-07-17 DIAGNOSIS — Z79899 Other long term (current) drug therapy: Secondary | ICD-10-CM | POA: Insufficient documentation

## 2024-07-17 DIAGNOSIS — S161XXA Strain of muscle, fascia and tendon at neck level, initial encounter: Secondary | ICD-10-CM | POA: Diagnosis not present

## 2024-07-17 DIAGNOSIS — M545 Low back pain, unspecified: Secondary | ICD-10-CM | POA: Insufficient documentation

## 2024-07-17 DIAGNOSIS — W19XXXA Unspecified fall, initial encounter: Secondary | ICD-10-CM

## 2024-07-17 DIAGNOSIS — S8001XA Contusion of right knee, initial encounter: Secondary | ICD-10-CM | POA: Diagnosis not present

## 2024-07-17 DIAGNOSIS — S3011XA Contusion of abdominal wall, initial encounter: Secondary | ICD-10-CM

## 2024-07-17 MED ORDER — ONDANSETRON HCL 4 MG/2ML IJ SOLN
4.0000 mg | Freq: Once | INTRAMUSCULAR | Status: AC
  Administered 2024-07-17: 4 mg via INTRAVENOUS
  Filled 2024-07-17: qty 2

## 2024-07-17 MED ORDER — SODIUM CHLORIDE 0.9 % IV BOLUS
1000.0000 mL | Freq: Once | INTRAVENOUS | Status: AC
Start: 2024-07-17 — End: 2024-07-18
  Administered 2024-07-17: 1000 mL via INTRAVENOUS

## 2024-07-17 MED ORDER — MORPHINE SULFATE (PF) 4 MG/ML IV SOLN
4.0000 mg | Freq: Once | INTRAVENOUS | Status: AC
  Administered 2024-07-17: 4 mg via INTRAVENOUS
  Filled 2024-07-17: qty 1

## 2024-07-17 NOTE — ED Triage Notes (Signed)
 Pt reports falling 8 feet off a roof x5 hours ago. Pt reports possible LOC. Pt endorses L leg pain, R side pain.

## 2024-07-17 NOTE — ED Provider Notes (Signed)
 [place] EMERGENCY DEPARTMENT AT [facility] Provider Note   CSN: [id number] Arrival date & time: 07/17/24  2211     Patient presents with: Jeremy [name] is a 63 y.o. male.  {Add pertinent medical, surgical, social history, OB history to HPI:32947} Patient is a 63 year old male with history of type 2 diabetes, hypertension, GERD, prior neck surgery, sleep apnea.  Patient presenting today for evaluation of a fall.  He reports climbing a ladder and attempting to get onto a roof when the ladder came out from under him causing him to fall.  He reports landing on his right side after falling approximately 8 feet onto the deck boards below.  He is uncertain as to whether or not he lost consciousness, but is experiencing headache, pain in his right chest and low back.  He also complains of left knee pain.  The fall occurred at approximately 630 this evening and has been ambulatory since.       Prior to Admission medications   Medication Sig Start Date End Date Taking? Authorizing Provider  Accu-Chek Softclix Lancets lancets Use to check blood sugar three times daily. 03/14/23   Vicci Barnie NOVAK, MD  acetaminophen  (TYLENOL ) 500 MG tablet Take 1,000 mg by mouth in the morning and at bedtime.     [provider]  amLODipine  (NORVASC ) 5 MG tablet Take 1 tablet (5 mg total) by mouth daily. 05/12/24   Vicci Barnie NOVAK, MD  ascorbic acid (VITAMIN C) 500 MG tablet Take 500 mg by mouth 2 (two) times daily.    [provider]  Blood Glucose Monitoring Suppl (ACCU-CHEK GUIDE) w/Device KIT Use to check blood sugar three times daily. 03/14/23   Vicci Barnie NOVAK, MD  Chlorphen-Phenyleph-APAP (CORICIDIN D COLD/FLU/SINUS) 2-5-325 MG TABS Take 1 tablet by mouth every 6 (six) hours as needed. Patient not taking: Reported on 05/12/2024 01/17/24   Petrina Pries, NP  Cyanocobalamin  (VITAMIN B-12 PO) Take 2,500 mcg by mouth daily.    [provider]  cyclobenzaprine   (FLEXERIL ) 10 MG tablet Take 1 tablet (10 mg total) by mouth 2 (two) times daily as needed for muscle spasms. 02/21/24   Vicci Barnie NOVAK, MD  ferrous sulfate  (FEROSUL) 325 (65 FE) MG tablet Take 1 tablet (325 mg total) by mouth 2 (two) times daily with a meal. 03/04/24   Vicci Barnie NOVAK, MD  fluticasone  (FLONASE ) 50 MCG/ACT nasal spray Place 1 spray into both nostrils daily as needed for allergies or rhinitis. 06/10/24   Vicci Barnie NOVAK, MD  glipiZIDE  (GLUCOTROL  XL) 5 MG 24 hr tablet Take 1 tablet (5 mg total) by mouth daily with breakfast. 05/12/24   Vicci Barnie NOVAK, MD  lisinopril  (ZESTRIL ) 20 MG tablet Take 1 tablet (20 mg total) by mouth in the morning and at bedtime. 02/21/24   Vicci Barnie NOVAK, MD  loratadine  (CLARITIN ) 10 MG tablet Take 10 mg by mouth daily.    [provider]  Magnesium  Oxide 200 MG TABS Take 600 mg by mouth in the morning and at bedtime.    [provider]  mometasone  (ASMANEX ) 220 MCG/ACT inhaler Inhale 2 puffs into the lungs 2 (two) times daily. 01/17/24   Petrina Pries, NP  Multiple Vitamins-Minerals (MULTIVITAMIN WITH MINERALS) tablet Take 1 tablet by mouth 2 (two) times daily.    [provider]  ondansetron  (ZOFRAN ) 8 MG tablet Take 1 tablet (8 mg total) by mouth daily as needed for nausea or vomiting. 05/23/24   Vicci,  Barnie NOVAK, MD  pantoprazole  (PROTONIX ) 40 MG tablet Take 1 tablet (40 mg total) by mouth 2 (two) times daily before a meal. 05/12/24   Vicci Barnie NOVAK, MD  Potassium 99 MG TABS Take 198 mg by mouth 2 (two) times daily.    [provider]  rOPINIRole  (REQUIP ) 1 MG tablet Take 1 tablet (1 mg total) by mouth at bedtime. 02/21/24   Vicci Barnie NOVAK, MD  sildenafil  (VIAGRA ) 50 MG tablet Take 1 tablet (50 mg total) by mouth 1/2 hr before intercourse.  Limit use to 1 tab/24 hr 04/03/24   Vicci Barnie NOVAK, MD  sodium chloride  1 g tablet Take 1 tablet (1 g total) by mouth daily. 07/08/24   Vicci Barnie NOVAK, MD   sucralfate  (CARAFATE ) 1 GM/10ML suspension Take 10 mLs (1 g total) by mouth 2 (two) times daily. 02/13/24   Vicci Barnie NOVAK, MD  traMADol  (ULTRAM ) 50 MG tablet Take 2 tablets (100 mg total) by mouth every 12 (twelve) hours as needed (to fill on or after 05/20/2023). 04/09/24   Vicci Barnie NOVAK, MD    Allergies: Cymbalta  [duloxetine  hcl], Doxycycline , Other, and Robaxin  [methocarbamol ]    Review of Systems  All other systems reviewed and are negative.   Updated Vital Signs BP (!) 145/61   Pulse 75   Temp 98.7 F (37.1 C)   Resp 16   Ht 5' 10 (1.778 m)   Wt 93.9 kg   SpO2 100%   BMI 29.70 kg/m   Physical Exam Vitals and nursing note reviewed.  Constitutional:      General: He is not in acute distress.    Appearance: He is well-developed. He is not diaphoretic.  HENT:     Head: Normocephalic and atraumatic.  Cardiovascular:     Rate and Rhythm: Normal rate and regular rhythm.     Heart sounds: No murmur heard.    No friction rub.  Pulmonary:     Effort: Pulmonary effort is normal. No respiratory distress.     Breath sounds: Normal breath sounds. No wheezing or rales.     Comments: There is tenderness to palpation in the lateral aspect of the right chest.  There is no palpable abnormality or crepitus.  Breath sounds are equal. Abdominal:     General: Bowel sounds are normal. There is no distension.     Palpations: Abdomen is soft.     Tenderness: There is no abdominal tenderness.  Musculoskeletal:        General: Normal range of motion.     Cervical back: Normal range of motion and neck supple.     Comments: The left knee is grossly normal in appearance.  There is no significant swelling or effusion.  He has good range of motion with no crepitus.  Skin:    General: Skin is warm and dry.  Neurological:     General: No focal deficit present.     Mental Status: He is alert and oriented to person, place, and time.     Coordination: Coordination normal.     (all labs  ordered are listed, but only abnormal results are displayed) Labs Reviewed - No data to display  EKG: None  Radiology: No results found.  {Document cardiac monitor, telemetry assessment procedure when appropriate:32947} Procedures   Medications Ordered in the ED  sodium chloride  0.9 % bolus 1,000 mL (has no administration in time range)  ondansetron  (ZOFRAN ) injection 4 mg (has no administration in time range)  morphine (PF)  4 MG/ML injection 4 mg (has no administration in time range)      {Click here for ABCD2, HEART and other calculators REFRESH Note before signing:1}                              Medical Decision Making Amount and/or Complexity of Data Reviewed Radiology: ordered.  Risk Prescription drug management.   ***  {Document critical care time when appropriate  Document review of labs and clinical decision tools ie CHADS2VASC2, etc  Document your independent review of radiology images and any outside records  Document your discussion with family members, caretakers and with consultants  Document social determinants of health affecting pt's care  Document your decision making why or why not admission, treatments were needed:32947:::1}   Final diagnoses:  None    ED Discharge Orders     None

## 2024-07-18 ENCOUNTER — Emergency Department (HOSPITAL_BASED_OUTPATIENT_CLINIC_OR_DEPARTMENT_OTHER)

## 2024-07-18 LAB — CBC WITH DIFFERENTIAL/PLATELET
Abs Immature Granulocytes: 0.11 K/uL — ABNORMAL HIGH (ref 0.00–0.07)
Basophils Absolute: 0 K/uL (ref 0.0–0.1)
Basophils Relative: 1 %
Eosinophils Absolute: 0.3 K/uL (ref 0.0–0.5)
Eosinophils Relative: 4 %
HCT: 33.3 % — ABNORMAL LOW (ref 39.0–52.0)
Hemoglobin: 10.8 g/dL — ABNORMAL LOW (ref 13.0–17.0)
Immature Granulocytes: 2 %
Lymphocytes Relative: 10 %
Lymphs Abs: 0.8 K/uL (ref 0.7–4.0)
MCH: 31.5 pg (ref 26.0–34.0)
MCHC: 32.4 g/dL (ref 30.0–36.0)
MCV: 97.1 fL (ref 80.0–100.0)
Monocytes Absolute: 0.7 K/uL (ref 0.1–1.0)
Monocytes Relative: 10 %
Neutro Abs: 5.6 K/uL (ref 1.7–7.7)
Neutrophils Relative %: 73 %
Platelets: 174 K/uL (ref 150–400)
RBC: 3.43 MIL/uL — ABNORMAL LOW (ref 4.22–5.81)
RDW: 12.6 % (ref 11.5–15.5)
WBC: 7.5 K/uL (ref 4.0–10.5)
nRBC: 0 % (ref 0.0–0.2)

## 2024-07-18 LAB — BASIC METABOLIC PANEL WITH GFR
Anion gap: 9 (ref 5–15)
BUN: 12 mg/dL (ref 8–23)
CO2: 25 mmol/L (ref 22–32)
Calcium: 9.1 mg/dL (ref 8.9–10.3)
Chloride: 103 mmol/L (ref 98–111)
Creatinine, Ser: 0.92 mg/dL (ref 0.61–1.24)
GFR, Estimated: >60 mL/min (ref >60)
Glucose, Bld: 132 mg/dL — ABNORMAL HIGH (ref 70–99)
Potassium: 3.9 mmol/L (ref 3.5–5.1)
Sodium: 137 mmol/L (ref 135–145)

## 2024-07-18 MED ORDER — IOHEXOL 300 MG/ML  SOLN
100.0000 mL | Freq: Once | INTRAMUSCULAR | Status: AC | PRN
  Administered 2024-07-18: 100 mL via INTRAVENOUS

## 2024-07-18 MED ORDER — HYDROCODONE-ACETAMINOPHEN 5-325 MG PO TABS: 1.0000 | ORAL_TABLET | Freq: Four times a day (QID) | ORAL | 0 refills | Status: DC | PRN

## 2024-07-18 MED ORDER — IOHEXOL 350 MG/ML SOLN: 100.0000 mL | Freq: Once | INTRAVENOUS | Status: DC | PRN

## 2024-07-18 NOTE — Discharge Instructions (Addendum)
 Take ibuprofen 600 mg every 6 hours as needed for pain.  Begin taking hydrocodone  as prescribed as needed for pain not relieved with ibuprofen.  Return to the ER if you experience any new and/or concerning issues.

## 2024-08-06 ENCOUNTER — Other Ambulatory Visit: Payer: Self-pay

## 2024-08-10 ENCOUNTER — Other Ambulatory Visit: Payer: Self-pay

## 2024-08-10 ENCOUNTER — Other Ambulatory Visit: Payer: Self-pay | Admitting: Internal Medicine

## 2024-08-10 ENCOUNTER — Encounter: Payer: Self-pay | Admitting: Internal Medicine

## 2024-08-11 ENCOUNTER — Other Ambulatory Visit: Payer: Self-pay

## 2024-08-11 ENCOUNTER — Encounter: Payer: Self-pay | Admitting: Physician Assistant

## 2024-08-13 ENCOUNTER — Other Ambulatory Visit: Payer: Self-pay | Admitting: Internal Medicine

## 2024-08-13 ENCOUNTER — Other Ambulatory Visit: Payer: Self-pay

## 2024-08-13 DIAGNOSIS — N201 Calculus of ureter: Secondary | ICD-10-CM

## 2024-08-13 MED ORDER — TRAMADOL HCL 50 MG PO TABS
100.0000 mg | ORAL_TABLET | Freq: Two times a day (BID) | ORAL | 2 refills | Status: DC | PRN
  Filled 2024-08-13: qty 120, 30d supply, fill #0
  Filled 2024-09-07 – 2024-09-18 (×5): qty 120, 30d supply, fill #1
  Filled 2024-10-23: qty 120, 30d supply, fill #2

## 2024-08-18 ENCOUNTER — Encounter: Payer: Self-pay | Admitting: Radiology

## 2024-09-08 ENCOUNTER — Other Ambulatory Visit: Payer: Self-pay

## 2024-09-08 ENCOUNTER — Encounter: Payer: Self-pay | Admitting: Physician Assistant

## 2024-09-09 ENCOUNTER — Other Ambulatory Visit: Payer: Self-pay

## 2024-09-10 ENCOUNTER — Other Ambulatory Visit: Payer: Self-pay

## 2024-09-15 ENCOUNTER — Encounter: Payer: Self-pay | Admitting: Internal Medicine

## 2024-09-15 ENCOUNTER — Other Ambulatory Visit: Payer: Self-pay

## 2024-09-15 ENCOUNTER — Ambulatory Visit: Attending: Internal Medicine | Admitting: Internal Medicine

## 2024-09-15 ENCOUNTER — Encounter: Payer: Self-pay | Admitting: Physician Assistant

## 2024-09-15 VITALS — BP 115/55 | HR 65 | Temp 98.0°F | Ht 70.0 in | Wt 206.0 lb

## 2024-09-15 DIAGNOSIS — N201 Calculus of ureter: Secondary | ICD-10-CM

## 2024-09-15 DIAGNOSIS — K2270 Barrett's esophagus without dysplasia: Secondary | ICD-10-CM

## 2024-09-15 DIAGNOSIS — Z7984 Long term (current) use of oral hypoglycemic drugs: Secondary | ICD-10-CM | POA: Diagnosis not present

## 2024-09-15 DIAGNOSIS — E1159 Type 2 diabetes mellitus with other circulatory complications: Secondary | ICD-10-CM | POA: Diagnosis not present

## 2024-09-15 DIAGNOSIS — K311 Adult hypertrophic pyloric stenosis: Secondary | ICD-10-CM | POA: Diagnosis not present

## 2024-09-15 DIAGNOSIS — Z23 Encounter for immunization: Secondary | ICD-10-CM | POA: Diagnosis not present

## 2024-09-15 DIAGNOSIS — I152 Hypertension secondary to endocrine disorders: Secondary | ICD-10-CM

## 2024-09-15 DIAGNOSIS — D509 Iron deficiency anemia, unspecified: Secondary | ICD-10-CM | POA: Diagnosis not present

## 2024-09-15 DIAGNOSIS — H9203 Otalgia, bilateral: Secondary | ICD-10-CM | POA: Diagnosis not present

## 2024-09-15 DIAGNOSIS — E119 Type 2 diabetes mellitus without complications: Secondary | ICD-10-CM | POA: Diagnosis not present

## 2024-09-15 DIAGNOSIS — R918 Other nonspecific abnormal finding of lung field: Secondary | ICD-10-CM | POA: Diagnosis not present

## 2024-09-15 LAB — POCT GLYCOSYLATED HEMOGLOBIN (HGB A1C): HbA1c, POC (controlled diabetic range): 6.7 % (ref 0.0–7.0)

## 2024-09-15 LAB — GLUCOSE, POCT (MANUAL RESULT ENTRY): POC Glucose: 202 mg/dL — AB (ref 70–99)

## 2024-09-15 MED ORDER — LISINOPRIL 20 MG PO TABS
20.0000 mg | ORAL_TABLET | Freq: Two times a day (BID) | ORAL | 1 refills | Status: AC
  Filled 2024-09-15 – 2024-10-23 (×2): qty 180, 90d supply, fill #0
  Filled ????-??-??: fill #1

## 2024-09-15 MED ORDER — ROPINIROLE HCL 1 MG PO TABS
1.0000 mg | ORAL_TABLET | Freq: Every day | ORAL | 1 refills | Status: AC
  Filled 2024-09-15 – 2024-10-05 (×3): qty 90, 90d supply, fill #0
  Filled ????-??-??: fill #1

## 2024-09-15 MED ORDER — GLIPIZIDE ER 5 MG PO TB24
5.0000 mg | ORAL_TABLET | Freq: Every day | ORAL | 1 refills | Status: AC
  Filled 2024-09-15 – 2024-11-11 (×3): qty 90, 90d supply, fill #0

## 2024-09-15 MED ORDER — FERROUS SULFATE 325 (65 FE) MG PO TABS
325.0000 mg | ORAL_TABLET | Freq: Two times a day (BID) | ORAL | 1 refills | Status: AC
  Filled 2024-09-15 – 2024-11-10 (×3): qty 180, 90d supply, fill #0

## 2024-09-15 NOTE — Progress Notes (Signed)
 Patient ID: Jeremy [name], male    DOB: December 29, 1960  MRN: [id number]  CC: Diabetes (DM & HTN f/u. Med refills. /Bilateral ear pain - started hearing aids 1 mo ago/Yes to flu vax)   Subjective: Jeremy [name] is a 63 y.o. male who presents for chronic ds management. His concerns today include:  Pt with hx of DM, HL, HTN,GERD, IDA (Barrett's esophagus and moderate gastritis thought to be NSAID induced on EGD; iron  infusions by Dr. 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  Myoview  neg 02/2021   Discussed the use of AI scribe software for clinical note transcription with the patient, who gave verbal consent to proceed.  History of Present Illness   Jeremy [name] is a 63 year old male who presents for a four-month follow-up visit.  He has been experiencing bilateral ear pain for the past week, which is more noticeable when his hearing aids are removed. He sometimes hears his heartbeat in his ears at night. He recently cleaned his ears with a Q-tip and peroxide, which temporarily alleviated the issue. No ear drainage is present.  His hypertension is managed with lisinopril  20 mg twice daily and amlodipine  5 mg daily. Blood pressure readings at home have been stable, and he continues to limit his salt intake.  DM: Results for orders placed or performed in visit on 09/15/24  POCT glucose (manual entry)   Collection Time: 09/15/24 10:32 AM  Result Value Ref Range   POC Glucose 202 (A) 70 - 99 mg/dl  POCT glycosylated hemoglobin (Hb A1C)   Collection Time: 09/15/24 10:33 AM  Result Value Ref Range   Hemoglobin A1C     HbA1c POC (<> result, manual entry)     HbA1c, POC (prediabetic range)     HbA1c, POC (controlled diabetic range) 6.7 0.0 - 7.0 %   *Note: Due to a large number of results and/or encounters for the requested time period, some results  have not been displayed. A complete set of results can be found in Results Review.  Diabetes management includes glipizide  5 mg once daily, increased from 2.5 mg. He occasionally forgets to take his morning medications, including glipizide , despite using a pill box. His A1c has improved from 7.2% to 6.7% over the past four months, but his blood sugar was elevated at 202 mg/dL today. He has misplaced his glucometer in his closet and is not checking his blood sugars regularly. He eats two meals a day and tries to avoid sugary foods.  GERD/Barrett's: He has a history of Barrett's esophagus and has reduced his pantoprazole  from 40 mg twice daily to once every other day due to concerns about food not moving through his system which he attributes to the pantoprazole . He uses sucralfate  as needed. He experienced vomiting about three weeks ago and has noticed slow bowel movements.  Had CT of chest abdomen and pelvis through the emergency room 2 months ago after a fall.  Incidental finding suggested gastric outlet obstruction.  He has a history of pyloric stenosis for which he underwent Roux-en-Y procedure in 2022.  Iron  deficiency: He has iron  deficiency anemia due to poor absorption from previous gastric surgery and is supposed to take iron  supplements twice daily but misses the morning dose about three times a week. His hemoglobin dropped from 13.3 g/dL in July to 10 g/dL in October.  Followed by oncologist Dr. Sherrod whom he last saw in July.  CT of abdomen/pelvis done 07/2024 showed kidney stones, and an elongated 7 mm stone in the right ureter. No burning or blood in the urine, but he reports urgency and occasional leakage in the mornings. Referred to [facility] and is awaiting appt.  He quit smoking in the late 1980s but is exposed to secondhand smoke from his significant other who smokes in the house. Recent imaging showed spots in the right upper lobe of his lungs suggestive of infection versus  inflammation.      Patient Active Problem List   Diagnosis Date Noted   Seasonal allergic rhinitis due to pollen 01/17/2024   Sneezing 01/17/2024   Viral respiratory illness 01/17/2024   COVID-19 01/17/2024   SIADH (syndrome of inappropriate ADH production) 09/07/2023   Anastomotic stricture of gastrojejunostomy 04/07/2023   PUD (peptic ulcer disease) 04/02/2023   Anemia 04/02/2023   Marginal ulcer 03/12/2023   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   Coffee ground emesis 03/11/2023   NSAID long-term use 03/11/2023   Anemia, posthemorrhagic, acute 03/11/2023   Upper GI bleeding 03/10/2023   Hypertension associated with type 2 diabetes mellitus (HCC) 01/15/2023   Constipation 10/25/2022   Upper abdominal pain 07/06/2022   Nausea and vomiting 07/06/2022   Hyponatremia 06/23/2021   Low serum vitamin B12 03/23/2021   Vitamin D deficiency 03/23/2021   S/P bypass gastrojejunostomy 01/03/2021   Gastric outlet obstruction    Duodenal stricture    Primary osteoarthritis of both hips 11/15/2020   Aortic atherosclerosis 08/30/2020   Coronary artery calcification 08/30/2020   Bilateral carpal tunnel syndrome 06/07/2020   Grief reaction 06/07/2020   Pyloric stenosis in adult 02/05/2020   Congenital hypertrophic pyloric stenosis (HCC)    Duodenal web    Barrett's esophagus without dysplasia 05/19/2019   OSA (obstructive sleep apnea) 04/30/2019   Iron  deficiency anemia 11/25/2018   Phimosis of penis 11/19/2018   Esophageal dysphagia 08/19/2018   Benign prostatic hyperplasia with post-void dribbling 01/31/2018   Absence of bladder continence 01/31/2018   Obesity (BMI 30-39.9) 11/05/2017   GERD (gastroesophageal reflux disease) 06/03/2017   Chronic pain of both knees 06/03/2017   Chronic lower back pain 06/03/2017   Type 2 diabetes mellitus without complication, without long-term current use of insulin  (HCC) 04/12/2016   Essential hypertension 04/12/2016     Current Outpatient  Medications on File Prior to Visit  Medication Sig Dispense Refill   Accu-Chek Softclix Lancets lancets Use to check blood sugar three times daily. 100 each 2   acetaminophen  (TYLENOL ) 500 MG tablet Take 1,000 mg by mouth in the morning and at bedtime.      amLODipine  (NORVASC ) 5 MG tablet Take 1 tablet (5 mg total) by mouth daily. 90 tablet 1   ascorbic acid (VITAMIN C) 500 MG tablet Take 500 mg by mouth 2 (two) times daily.     Blood Glucose Monitoring Suppl (ACCU-CHEK GUIDE) w/Device KIT Use to check blood sugar three times daily. 1 kit 0   Cyanocobalamin  (VITAMIN B-12 PO) Take 2,500 mcg by mouth daily.     cyclobenzaprine  (FLEXERIL ) 10 MG tablet Take 1 tablet (10 mg total) by mouth 2 (two) times daily as needed for muscle spasms. 180 tablet 1   ferrous sulfate  (FEROSUL) 325 (65 FE) MG tablet Take 1 tablet (325 mg total) by mouth 2 (two) times daily with a meal. 180 tablet 1   fluticasone  (FLONASE ) 50 MCG/ACT nasal spray Place 1 spray into  both nostrils daily as needed for allergies or rhinitis. 16 g 2   glipiZIDE  (GLUCOTROL  XL) 5 MG 24 hr tablet Take 1 tablet (5 mg total) by mouth daily with breakfast. 90 tablet 1   lisinopril  (ZESTRIL ) 20 MG tablet Take 1 tablet (20 mg total) by mouth in the morning and at bedtime. 180 tablet 1   loratadine  (CLARITIN ) 10 MG tablet Take 10 mg by mouth daily.     Magnesium  Oxide 200 MG TABS Take 600 mg by mouth in the morning and at bedtime.     Multiple Vitamins-Minerals (MULTIVITAMIN WITH MINERALS) tablet Take 1 tablet by mouth 2 (two) times daily.     ondansetron  (ZOFRAN ) 8 MG tablet Take 1 tablet (8 mg total) by mouth daily as needed for nausea or vomiting. 30 tablet 2   pantoprazole  (PROTONIX ) 40 MG tablet Take 1 tablet (40 mg total) by mouth 2 (two) times daily before a meal. 60 tablet 5   Potassium 99 MG TABS Take 198 mg by mouth 2 (two) times daily.     rOPINIRole  (REQUIP ) 1 MG tablet Take 1 tablet (1 mg total) by mouth at bedtime. 90 tablet 1    sildenafil  (VIAGRA ) 50 MG tablet Take 1 tablet (50 mg total) by mouth 1/2 hr before intercourse.  Limit use to 1 tab/24 hr 10 tablet 6   sodium chloride  1 g tablet Take 1 tablet (1 g total) by mouth daily. 30 tablet 3   sucralfate  (CARAFATE ) 1 GM/10ML suspension Take 10 mLs (1 g total) by mouth 2 (two) times daily. 600 mL 2   traMADol  (ULTRAM ) 50 MG tablet Take 2 tablets (100 mg total) by mouth every 12 (twelve) hours as needed (to fill on or after 05/20/2023). 120 tablet 2   Chlorphen-Phenyleph-APAP (CORICIDIN D COLD/FLU/SINUS) 2-5-325 MG TABS Take 1 tablet by mouth every 6 (six) hours as needed. (Patient not taking: Reported on 09/15/2024) 30 tablet 0   mometasone  (ASMANEX ) 220 MCG/ACT inhaler Inhale 2 puffs into the lungs 2 (two) times daily. 1 each 1   No current facility-administered medications on file prior to visit.    Allergies  Allergen Reactions   Cymbalta  [Duloxetine  Hcl] Other (See Comments)    Caused anorgasmia   Doxycycline      Unknown   Other Nausea And Vomiting    Mayonnaise mustard ketchup   Robaxin  [Methocarbamol ] Other (See Comments)    Had hiccups x 4 hours after taking    Social History   Socioeconomic History   Marital status: Significant Other    Spouse name: Not on file   Number of children: Not on file   Years of education: Not on file   Highest education level: Not on file  Occupational History   Occupation: product/process development scientist  Tobacco Use   Smoking status: Former    Current packs/day: 0.00    Types: Cigarettes    Quit date: 10/16/1988    Years since quitting: 35.9   Smokeless tobacco: Never  Vaping Use   Vaping status: Never Used  Substance and Sexual Activity   Alcohol use: No   Drug use: Yes    Comment: rare   Sexual activity: Not on file  Other Topics Concern   Not on file  Social History Narrative   Volunteers as it sales professional   Social Drivers of Health   Financial Resource Strain: Medium Risk (02/21/2024)   Overall Financial Resource  Strain (CARDIA)    Difficulty of Paying Living Expenses: Somewhat hard  Food Insecurity:  Food Insecurity Present (02/21/2024)   Hunger Vital Sign    Worried About Running Out of Food in the Last Year: Never true    Ran Out of Food in the Last Year: Sometimes true  Transportation Needs: No Transportation Needs (02/21/2024)   PRAPARE - Administrator, Civil Service (Medical): No    Lack of Transportation (Non-Medical): No  Physical Activity: Insufficiently Active (02/21/2024)   Exercise Vital Sign    Days of Exercise per Week: 1 day    Minutes of Exercise per Session: 20 min  Stress: No Stress Concern Present (02/21/2024)   [employer] of Occupational Health - Occupational Stress Questionnaire    Feeling of Stress : Only a little  Social Connections: Socially Isolated (02/21/2024)   Social Connection and Isolation Panel    Frequency of Communication with Friends and Family: Once a week    Frequency of Social Gatherings with Friends and Family: Once a week    Attends Religious Services: More than 4 times per year    Active Member of [employer] or Organizations: No    Attends Banker Meetings: Never    Marital Status: Separated  Intimate Partner Violence: Not At Risk (02/21/2024)   Humiliation, Afraid, Rape, and Kick questionnaire    Fear of Current or Ex-Partner: No    Emotionally Abused: No    Physically Abused: No    Sexually Abused: No    Family History  Problem Relation Age of Onset   Breast cancer Mother    Pancreatic cancer Mother    CAD Father    Colon cancer Paternal Grandfather     Past Surgical History:  Procedure Laterality Date   BACK SURGERY  1993   lumbar   BALLOON DILATION N/A 04/25/2023   Procedure: BALLOON DILATION;  Surgeon: Cindie Carlin POUR, DO;  Location: [facility];  Service: Endoscopy;  Laterality: N/A;   BIOPSY  02/07/2019   Procedure: BIOPSY;  Surgeon: Harvey Margo CROME, MD;  Location: [facility];  Service: Endoscopy;;   BIOPSY   01/30/2022   Procedure: BIOPSY;  Surgeon: Cindie Carlin POUR, DO;  Location: [facility];  Service: Endoscopy;;   BIOPSY  04/25/2023   Procedure: BIOPSY;  Surgeon: Cindie Carlin POUR, DO;  Location: [facility];  Service: Endoscopy;;   CERVICAL SPINE SURGERY  2000   COLONOSCOPY N/A 02/07/2019   Dr. Harvey: External hemorrhoids next colonoscopy in 10 years   ESOPHAGOGASTRODUODENOSCOPY N/A 02/07/2019   Dr. Harvey: Barrett's esophagus without dysplasia chronic inactive gastritis but no H. 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, nonbleeding duodenal diverticulum,   ESOPHAGOGASTRODUODENOSCOPY N/A 01/05/2020   Procedure: ESOPHAGOGASTRODUODENOSCOPY (EGD);  Surgeon: Harvey Margo CROME, MD;  Location: [facility];  Service: Endoscopy;  Laterality: N/A;  10:30am   ESOPHAGOGASTRODUODENOSCOPY (EGD) WITH PROPOFOL  N/A 12/06/2020   Procedure: ESOPHAGOGASTRODUODENOSCOPY (EGD) WITH PROPOFOL ;  Surgeon: Cindie Carlin POUR, DO;  Location: [facility];  Service: Endoscopy;  Laterality: N/A;  11:45am   ESOPHAGOGASTRODUODENOSCOPY (EGD) WITH PROPOFOL  N/A 12/22/2021   long-segment Barrett's s/p biopsy, gastric bypass with normal sized puch and intact staple line. GJ anastomosis with healthy mucosa. Gastritis.   ESOPHAGOGASTRODUODENOSCOPY (EGD) WITH PROPOFOL  N/A 01/30/2022   Procedure: ESOPHAGOGASTRODUODENOSCOPY (EGD) WITH PROPOFOL ;  Surgeon: Cindie Carlin POUR, DO;  [facility]: [facility];  Service: Endoscopy;  Laterality: N/A;  11:00am   ESOPHAGOGASTRODUODENOSCOPY (EGD) WITH PROPOFOL  N/A 03/11/2023   Procedure: ESOPHAGOGASTRODUODENOSCOPY (EGD) WITH PROPOFOL ;  Surgeon:  Armbruster, Elspeth SQUIBB, MD;  Location: [facility];  Service: Gastroenterology;  Laterality: N/A;   ESOPHAGOGASTRODUODENOSCOPY (EGD) WITH PROPOFOL  N/A 04/25/2023   Procedure: ESOPHAGOGASTRODUODENOSCOPY (EGD) WITH PROPOFOL ;  Surgeon: Cindie Carlin POUR, DO;  [facility]: [facility];  Service: Endoscopy;   Laterality: N/A;  10:30 am, asa 3   GASTROJEJUNOSTOMY N/A 01/03/2021   Procedure: GASTROJEJUNOSTOMY;  Surgeon: Mavis Anes, MD;  Location: [facility];  Service: General;  Laterality: N/A;   HAND SURGERY     SAVORY DILATION N/A 01/05/2020   Procedure: SAVORY DILATION;  Surgeon: Harvey Margo CROME, MD;  Location: [facility];  Service: Endoscopy;  Laterality: N/A;    ROS: Review of Systems Negative except as stated above  PHYSICAL EXAM: BP (!) 115/55 (BP Location: Left Arm, Patient Position: Sitting, Cuff Size: Large)   Pulse 65   Temp 98 F (36.7 C) (Oral)   Ht 5' 10 (1.778 m)   Wt 206 lb (93.4 kg)   SpO2 98%   BMI 29.56 kg/m   Wt Readings from Last 3 Encounters:  09/15/24 206 lb (93.4 kg)  07/17/24 207 lb (93.9 kg)  05/15/24 215 lb (97.5 kg)    Physical Exam General appearance - alert, well appearing, older caucasian male and in no distress Mental status - normal mood, behavior, speech, dress, motor activity, and thought processes Ears - bilateral TM's and external ear canals normal, a sprig dark hair in both canals. Neck - supple, no significant adenopathy Chest - clear to auscultation, no wheezes, rales or rhonchi, symmetric air entry Heart - normal rate, regular rhythm, normal S1, S2, no murmurs, rubs, clicks or gallops Extremities - peripheral pulses normal, no pedal edema, no clubbing or cyanosis Skin - healed burn spot noted inner aspect LT heel.     Latest Ref Rng & Units 07/18/2024   12:47 AM 05/12/2024   12:18 PM 02/08/2024    4:37 PM  CMP  Glucose 70 - 99 mg/dL 867  839  892   BUN 8 - 23 mg/dL 12  15  12    Creatinine 0.61 - 1.24 mg/dL 9.07  9.27  9.01   Sodium 135 - 145 mmol/L 137  136  135   Potassium 3.5 - 5.1 mmol/L 3.9  4.1  4.6   Chloride 98 - 111 mmol/L 103  100  98   CO2 22 - 32 mmol/L 25  22  22    Calcium  8.9 - 10.3 mg/dL 9.1  9.2  89.7    Lipid Panel     Component Value Date/Time   CHOL 116 09/07/2023 1220   TRIG 141 09/07/2023 1220   HDL 44  09/07/2023 1220   CHOLHDL 2.6 09/07/2023 1220   CHOLHDL 4.8 10/17/2016 1012   VLDL NOT CALC 10/17/2016 1012   LDLCALC 48 09/07/2023 1220    CBC    Component Value Date/Time   WBC 7.5 07/18/2024 0047   RBC 3.43 (L) 07/18/2024 0047   HGB 10.8 (L) 07/18/2024 0047   HGB 13.3 04/15/2024 1028   HGB 10.0 (L) 05/17/2021 1657   HCT 33.3 (L) 07/18/2024 0047   HCT 31.6 (L) 05/17/2021 1657   PLT 174 07/18/2024 0047   PLT 179 04/15/2024 1028   PLT 236 05/17/2021 1657   MCV 97.1 07/18/2024 0047   MCV 89 05/17/2021 1657   MCH 31.5 07/18/2024 0047   MCHC 32.4 07/18/2024 0047   RDW 12.6 07/18/2024 0047   RDW 16.4 (H) 05/17/2021 1657   LYMPHSABS 0.8 07/18/2024 0047  LYMPHSABS 2.0 08/13/2019 1617   MONOABS 0.7 07/18/2024 0047   EOSABS 0.3 07/18/2024 0047   EOSABS 0.4 08/13/2019 1617   BASOSABS 0.0 07/18/2024 0047   BASOSABS 0.1 08/13/2019 1617    ASSESSMENT AND PLAN: 1. Type 2 diabetes mellitus with other circulatory complication, without long-term current use of insulin  (HCC) (Primary) At goal. Advised patient to continue Glucotrol  5 mg daily.  Try setting alarm on his phone every morning to remind him to take his morning medications consistently. - POCT glucose (manual entry) - POCT glycosylated hemoglobin (Hb A1C)  2. Diabetes mellitus treated with oral medication (HCC) See #1 above. - glipiZIDE  (GLUCOTROL  XL) 5 MG 24 hr tablet; Take 1 tablet (5 mg total) by mouth daily with breakfast.  Dispense: 90 tablet; Refill: 1 - Microalbumin / creatinine urine ratio  3. Hypertension associated with type 2 diabetes mellitus (HCC) Continue lisinopril  and amlodipine  - lisinopril  (ZESTRIL ) 20 MG tablet; Take 1 tablet (20 mg total) by mouth in the morning and at bedtime.  Dispense: 180 tablet; Refill: 1  4. Iron  deficiency anemia, unspecified iron  deficiency anemia type Recheck CBC and iron  studies.  Continue iron  supplement. - ferrous sulfate  (FEROSUL) 325 (65 FE) MG tablet; Take 1 tablet  (325 mg total) by mouth 2 (two) times daily with a meal.  Dispense: 180 tablet; Refill: 1 - CBC - Iron , TIBC and Ferritin Panel  5. Barrett's esophagus without dysplasia Advised patient that given his history of Barrett's esophagus, he should take the pantoprazole  at least once a day. - Ambulatory referral to Gastroenterology  6. Acute ear pain, bilateral Ears do not appear to be infected at this time.  Soreness may be due to him getting used to the hearing aids  7. Gastric outlet obstruction Will get him back in with a gastroenterologist to evaluate this finding on CT scan.  Findings seen may be indicative of prior pyloric stenosis for which she underwent Roux-en-Y procedure in 2022. - Ambulatory referral to Gastroenterology  8. Ureterolithiasis Will check urinalysis. Patient given information for [facility] to call them to schedule letting them know that we have submitted referral several weeks ago. - Urinalysis, Routine w reflex microscopic  9. Lung nodules Patient quit smoking over 20 years ago.  However he continues to be exposed to secondhand smoke.  I told him we should plan to repeat CAT scan of the chest again in about 1 year.  10. Need for influenza vaccination - Flu vaccine trivalent PF, 6mos and older(Flulaval,Afluria,Fluarix,Fluzone)  Patient was given the opportunity to ask questions.  Patient verbalized understanding of the plan and was able to repeat key elements of the plan.   This documentation was completed using Paediatric nurse.  Any transcriptional errors are unintentional.  Orders Placed This Encounter  Procedures   POCT glucose (manual entry)   POCT glycosylated hemoglobin (Hb A1C)     Requested Prescriptions   Pending Prescriptions Disp Refills   glipiZIDE  (GLUCOTROL  XL) 5 MG 24 hr tablet 90 tablet 1    Sig: Take 1 tablet (5 mg total) by mouth daily with breakfast.   ferrous sulfate  (FEROSUL) 325 (65 FE) MG tablet 180 tablet  1    Sig: Take 1 tablet (325 mg total) by mouth 2 (two) times daily with a meal.   cyclobenzaprine  (FLEXERIL ) 10 MG tablet 180 tablet 1    Sig: Take 1 tablet (10 mg total) by mouth 2 (two) times daily as needed for muscle spasms.   lisinopril  (ZESTRIL ) 20  MG tablet 180 tablet 1    Sig: Take 1 tablet (20 mg total) by mouth in the morning and at bedtime.   rOPINIRole  (REQUIP ) 1 MG tablet 90 tablet 1    Sig: Take 1 tablet (1 mg total) by mouth at bedtime.    No follow-ups on file.  Barnie Louder, MD, FACP

## 2024-09-15 NOTE — Patient Instructions (Addendum)
 [facility] ph. # [phone].Address [address] [place] [address].    VISIT SUMMARY: During your visit today, we discussed several health concerns including your diabetes, hypertension, anemia, Barrett's esophagus, kidney stones, ear pain, and lung findings. We reviewed your current medications and made some adjustments to help manage your conditions more effectively.  YOUR PLAN: -TYPE 2 DIABETES MELLITUS: Type 2 diabetes is a condition where your body does not use insulin  properly, leading to high blood sugar levels. Your A1c has improved to 6.7%, but your blood sugar was elevated at 202 mg/dL today. Please relocate your glucometer and check your blood sugars in the mornings and some evenings. Set an alarm to remind you to take your morning medications, including glipizide  5 mg daily. We will also schedule a diabetic eye exam and send the report to your provider.  -ESSENTIAL HYPERTENSION: Hypertension, or high blood pressure, is when the force of your blood against your artery walls is too high. Your blood pressure is well-controlled with your current medications. Continue taking lisinopril  20 mg twice daily and amlodipine  5 mg daily, and keep limiting your salt intake.  -IRON  DEFICIENCY ANEMIA: Iron  deficiency anemia is a condition where you do not have enough iron , leading to a lower number of red blood cells. Your hemoglobin has dropped to 10 g/dL. Please set an alarm to remind you to take your iron  supplements twice daily. We will recheck your hemoglobin and iron  levels at the lab.  -BARRETT'S ESOPHAGUS AND POSSIBLE GASTRIC OUTLET OBSTRUCTION: Barrett's esophagus is a condition where the lining of your esophagus is damaged by stomach acid, increasing the risk of cancer. You may also have a gastric outlet obstruction, which is a blockage that slows or stops food from leaving your stomach. Continue taking pantoprazole  at least once daily. You are referred to gastroenterologist Dr. Cindie  for further evaluation.  -CALCULUS OF RIGHT URETER AND NEPHROLITHIASIS: Kidney stones are hard deposits made of minerals and salts that form inside your kidneys. You have small stones in both kidneys and a stone in your right ureter. Follow up with urology for further evaluation of your kidney and ureteral stones.  -OTALGIA, BILATERAL: Otalgia means ear pain. You have been experiencing bilateral ear pain, which is more noticeable without your hearing aids. This may be due to hair against your eardrum.  -OTHER NONSPECIFIC ABNORMAL FINDING OF LUNG FIELD: Recent imaging showed spots in the right upper lobe of your lungs, which could be due to inflammation or infection. Given your history of smoking and exposure to secondhand smoke, we will repeat a chest CT in six months to monitor these findings.  INSTRUCTIONS: Please follow up with the lab to recheck your hemoglobin and iron  levels. Schedule a diabetic eye exam and send the report to your provider. Follow up with urology for further evaluation of your kidney and ureteral stones. You are also referred to gastroenterologist Dr. Cindie for further evaluation of yo ur Barrett's esophagus and possible gastric outlet obstruction. We will repeat a chest CT in six months to monitor the spots in your lungs.                      Contains text generated by [employer].                                 Contains text generated by [employer].

## 2024-09-16 ENCOUNTER — Encounter: Payer: Self-pay | Admitting: Physician Assistant

## 2024-09-16 ENCOUNTER — Other Ambulatory Visit: Payer: Self-pay

## 2024-09-17 ENCOUNTER — Other Ambulatory Visit: Payer: Self-pay

## 2024-09-17 ENCOUNTER — Ambulatory Visit: Payer: Self-pay | Admitting: Internal Medicine

## 2024-09-17 ENCOUNTER — Encounter: Payer: Self-pay | Admitting: Physician Assistant

## 2024-09-17 LAB — IRON,TIBC AND FERRITIN PANEL
Ferritin: 59 ng/mL (ref 30–400)
Iron Saturation: 18 % (ref 15–55)
Iron: 57 ug/dL (ref 38–169)
Total Iron Binding Capacity: 309 ug/dL (ref 250–450)
UIBC: 252 ug/dL (ref 111–343)

## 2024-09-17 LAB — MICROSCOPIC EXAMINATION
Bacteria, UA: NONE SEEN
Casts: NONE SEEN /LPF
RBC, Urine: NONE SEEN /HPF (ref 0–2)
WBC, UA: >30 /HPF — AB (ref 0–5)

## 2024-09-17 LAB — MICROALBUMIN / CREATININE URINE RATIO
Creatinine, Urine: 81.8 mg/dL
Microalb/Creat Ratio: 6 mg/g{creat} (ref 0–29)
Microalbumin, Urine: 4.9 ug/mL

## 2024-09-17 LAB — URINALYSIS, ROUTINE W REFLEX MICROSCOPIC
Bilirubin, UA: NEGATIVE
Ketones, UA: NEGATIVE
Nitrite, UA: NEGATIVE
Protein,UA: NEGATIVE
RBC, UA: NEGATIVE
Specific Gravity, UA: 1.017 (ref 1.005–1.030)
Urobilinogen, Ur: 0.2 mg/dL (ref 0.2–1.0)
pH, UA: 5.5 (ref 5.0–7.5)

## 2024-09-17 LAB — CBC
Hematocrit: 42.9 % (ref 37.5–51.0)
Hemoglobin: 13.7 g/dL (ref 13.0–17.7)
MCH: 30.4 pg (ref 26.6–33.0)
MCHC: 31.9 g/dL (ref 31.5–35.7)
MCV: 95 fL (ref 79–97)
Platelets: 203 x10E3/uL (ref 150–450)
RBC: 4.5 x10E6/uL (ref 4.14–5.80)
RDW: 12.3 % (ref 11.6–15.4)
WBC: 5.8 x10E3/uL (ref 3.4–10.8)

## 2024-09-18 ENCOUNTER — Other Ambulatory Visit: Payer: Self-pay

## 2024-09-18 ENCOUNTER — Encounter: Payer: Self-pay | Admitting: Physician Assistant

## 2024-09-18 ENCOUNTER — Telehealth: Payer: Self-pay

## 2024-09-18 NOTE — Telephone Encounter (Signed)
 Pharmacy Patient Advocate Encounter  Received notification from [employer] that Prior Authorization for TRAMADOL  has been APPROVED from 09/18/2024 to 03/17/2025   PA #/Case ID/Reference #: [id number]

## 2024-09-21 ENCOUNTER — Other Ambulatory Visit: Payer: Self-pay | Admitting: Internal Medicine

## 2024-09-21 MED ORDER — CIPROFLOXACIN HCL 500 MG PO TABS
500.0000 mg | ORAL_TABLET | Freq: Two times a day (BID) | ORAL | 0 refills | Status: AC
  Filled 2024-09-21: qty 14, 7d supply, fill #0

## 2024-09-22 ENCOUNTER — Other Ambulatory Visit: Payer: Self-pay

## 2024-09-22 NOTE — Telephone Encounter (Signed)
 Noted! Thank you

## 2024-09-29 ENCOUNTER — Encounter: Payer: Self-pay | Admitting: Internal Medicine

## 2024-09-29 ENCOUNTER — Other Ambulatory Visit: Payer: Self-pay

## 2024-10-01 ENCOUNTER — Ambulatory Visit: Admitting: Gastroenterology

## 2024-10-02 DIAGNOSIS — N202 Calculus of kidney with calculus of ureter: Secondary | ICD-10-CM | POA: Diagnosis not present

## 2024-10-06 ENCOUNTER — Other Ambulatory Visit: Payer: Self-pay

## 2024-10-20 ENCOUNTER — Inpatient Hospital Stay: Attending: Internal Medicine

## 2024-10-20 ENCOUNTER — Inpatient Hospital Stay: Admitting: Internal Medicine

## 2024-10-20 VITALS — BP 133/54 | HR 66 | Temp 97.8°F | Resp 17 | Ht 70.0 in | Wt 211.3 lb

## 2024-10-20 DIAGNOSIS — D508 Other iron deficiency anemias: Secondary | ICD-10-CM

## 2024-10-20 LAB — CBC WITH DIFFERENTIAL (CANCER CENTER ONLY)
Abs Immature Granulocytes: 0.06 K/uL (ref 0.00–0.07)
Basophils Absolute: 0.1 K/uL (ref 0.0–0.1)
Basophils Relative: 1 %
Eosinophils Absolute: 0.4 K/uL (ref 0.0–0.5)
Eosinophils Relative: 9 %
HCT: 39.8 % (ref 39.0–52.0)
Hemoglobin: 13.6 g/dL (ref 13.0–17.0)
Immature Granulocytes: 1 %
Lymphocytes Relative: 30 %
Lymphs Abs: 1.4 K/uL (ref 0.7–4.0)
MCH: 30.9 pg (ref 26.0–34.0)
MCHC: 34.2 g/dL (ref 30.0–36.0)
MCV: 90.5 fL (ref 80.0–100.0)
Monocytes Absolute: 0.5 K/uL (ref 0.1–1.0)
Monocytes Relative: 10 %
Neutro Abs: 2.3 K/uL (ref 1.7–7.7)
Neutrophils Relative %: 49 %
Platelet Count: 180 K/uL (ref 150–400)
RBC: 4.4 MIL/uL (ref 4.22–5.81)
RDW: 12.8 % (ref 11.5–15.5)
WBC Count: 4.7 K/uL (ref 4.0–10.5)
nRBC: 0 % (ref 0.0–0.2)

## 2024-10-20 LAB — IRON AND IRON BINDING CAPACITY (CC-WL,HP ONLY)
Iron: 66 ug/dL (ref 45–182)
Saturation Ratios: 19 % (ref 17.9–39.5)
TIBC: 337 ug/dL (ref 250–450)
UIBC: 272 ug/dL

## 2024-10-20 LAB — FERRITIN: Ferritin: 60 ng/mL (ref 24–336)

## 2024-10-20 NOTE — Progress Notes (Signed)
 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  PRIOR THERAPY: Iron  infusion with Venofer  300 mg IV weekly for 3 weeks.  CURRENT THERAPY: Over-the-counter oral iron  tablet with vitamin C twice daily.  INTERVAL HISTORY: Jeremy [name] 64 y.o. male return to the clinic today for follow-up visit. Discussed the use of AI scribe software for clinical note transcription with the patient, who gave verbal consent to proceed.  History of Present Illness Jeremy [name] is a 64 year old male with iron  deficiency anemia secondary to malabsorption following bypass gastrogynostomy who presents for routine evaluation and repeat blood work.  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 He previously took vitamin C twice daily but discontinued this due to a change in supplement formulation and is seeking an alternative.  He denies new symptoms since his last visit, including fatigue or weakness. Recent iron  studies from December 2025 were within normal limits, and current laboratory results demonstrate stable hemoglobin at 13.6 g/dL.  He inquires about a possible association between his anemia and increased skin fragility, describing thin skin that is easily injured. He reports no other hematologic or systemic symptoms.    MEDICAL HISTORY: Past Medical History:  Diagnosis Date   Diabetes mellitus without complication (HCC)    GERD (gastroesophageal reflux disease)    Heart murmur    Hyperlipidemia    Hypertension    IDA (iron  deficiency anemia)    Rash of entire body 03/2016   Sleep apnea     ALLERGIES:  is allergic to cymbalta  [duloxetine   hcl], doxycycline , other, and robaxin  [methocarbamol ].  MEDICATIONS:  Current Outpatient Medications  Medication Sig Dispense Refill   Accu-Chek Softclix Lancets lancets Use to check blood sugar three times daily. 100 each 2   acetaminophen  (TYLENOL ) 500 MG tablet Take 1,000 mg by mouth in the morning and at bedtime.      amLODipine  (NORVASC ) 5 MG tablet Take 1 tablet (5 mg total) by mouth daily. 90 tablet 1   ascorbic acid (VITAMIN C) 500 MG tablet Take 500 mg by mouth 2 (two) times daily.     Blood Glucose Monitoring Suppl (ACCU-CHEK GUIDE) w/Device KIT Use to check blood sugar three times daily. 1 kit 0   Cyanocobalamin  (VITAMIN B-12 PO) Take 2,500 mcg by mouth daily.     cyclobenzaprine  (FLEXERIL ) 10 MG tablet Take 1 tablet (10 mg total) by mouth 2 (two) times daily as needed for muscle spasms. 180 tablet 1   ferrous sulfate  (FEROSUL) 325 (65 FE) MG tablet Take 1 tablet (325 mg total) by mouth 2 (two) times daily with a meal. 180 tablet 1   fluticasone  (FLONASE ) 50 MCG/ACT nasal spray Place 1 spray into both nostrils daily as needed for allergies or rhinitis. 16 g 2   glipiZIDE  (GLUCOTROL  XL) 5 MG 24 hr tablet Take 1 tablet (5 mg total) by mouth daily with breakfast. 90 tablet 1   lisinopril  (ZESTRIL ) 20 MG tablet Take 1 tablet (20 mg total) by mouth in the morning and at bedtime. 180 tablet 1   loratadine  (CLARITIN ) 10 MG tablet Take 10 mg by mouth daily.     Magnesium  Oxide 200 MG TABS Take 600  mg by mouth in the morning and at bedtime.     Multiple Vitamins-Minerals (MULTIVITAMIN WITH MINERALS) tablet Take 1 tablet by mouth 2 (two) times daily.     ondansetron  (ZOFRAN ) 8 MG tablet Take 1 tablet (8 mg total) by mouth daily as needed for nausea or vomiting. 30 tablet 2   pantoprazole  (PROTONIX ) 40 MG tablet Take 1 tablet (40 mg total) by mouth 2 (two) times daily before a meal. 60 tablet 5   Potassium 99 MG TABS Take 198 mg by mouth 2 (two) times daily.     rOPINIRole  (REQUIP ) 1 MG tablet  Take 1 tablet (1 mg total) by mouth at bedtime. 90 tablet 1   sildenafil  (VIAGRA ) 50 MG tablet Take 1 tablet (50 mg total) by mouth 1/2 hr before intercourse.  Limit use to 1 tab/24 hr 10 tablet 6   sodium chloride  1 g tablet Take 1 tablet (1 g total) by mouth daily. 30 tablet 3   sucralfate  (CARAFATE ) 1 GM/10ML suspension Take 10 mLs (1 g total) by mouth 2 (two) times daily. 600 mL 2   traMADol  (ULTRAM ) 50 MG tablet Take 2 tablets (100 mg total) by mouth every 12 (twelve) hours as needed (to fill on or after 05/20/2023). 120 tablet 2   No current facility-administered medications for this visit.    SURGICAL HISTORY:  Past Surgical History:  Procedure Laterality Date   BACK SURGERY  1993   lumbar   BALLOON DILATION N/A 04/25/2023   Procedure: BALLOON DILATION;  Surgeon: Cindie Carlin POUR, DO;  [facility]: [facility];  Service: Endoscopy;  Laterality: N/A;   BIOPSY  02/07/2019   Procedure: BIOPSY;  Surgeon: Harvey Margo CROME, MD;  Location: [facility];  Service: Endoscopy;;   BIOPSY  01/30/2022   Procedure: BIOPSY;  Surgeon: Cindie Carlin POUR, DO;  [facility]: [facility];  Service: Endoscopy;;   BIOPSY  04/25/2023   Procedure: BIOPSY;  Surgeon: Cindie Carlin POUR, DO;  [facility]: [facility];  Service: Endoscopy;;   CERVICAL SPINE SURGERY  2000   COLONOSCOPY N/A 02/07/2019   Dr. Harvey: External hemorrhoids next colonoscopy in 10 years   ESOPHAGOGASTRODUODENOSCOPY N/A 02/07/2019   Dr. Harvey: Barrett's esophagus without dysplasia chronic inactive gastritis but no H. 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, nonbleeding duodenal diverticulum,   ESOPHAGOGASTRODUODENOSCOPY N/A 01/05/2020   Procedure: ESOPHAGOGASTRODUODENOSCOPY (EGD);  Surgeon: Harvey Margo CROME, MD;  [facility]: [facility];  Service: Endoscopy;  Laterality: N/A;  10:30am   ESOPHAGOGASTRODUODENOSCOPY (EGD) WITH PROPOFOL  N/A 12/06/2020   Procedure:  ESOPHAGOGASTRODUODENOSCOPY (EGD) WITH PROPOFOL ;  Surgeon: Cindie Carlin POUR, DO;  [facility]: [facility];  Service: Endoscopy;  Laterality: N/A;  11:45am   ESOPHAGOGASTRODUODENOSCOPY (EGD) WITH PROPOFOL  N/A 12/22/2021   long-segment Barrett's s/p biopsy, gastric bypass with normal sized puch and intact staple line. GJ anastomosis with healthy mucosa. Gastritis.   ESOPHAGOGASTRODUODENOSCOPY (EGD) WITH PROPOFOL  N/A 01/30/2022   Procedure: ESOPHAGOGASTRODUODENOSCOPY (EGD) WITH PROPOFOL ;  Surgeon: Cindie Carlin POUR, DO;  [facility];  Service: Endoscopy;  Laterality: N/A;  11:00am   ESOPHAGOGASTRODUODENOSCOPY (EGD) WITH PROPOFOL  N/A 03/11/2023   Procedure: ESOPHAGOGASTRODUODENOSCOPY (EGD) WITH PROPOFOL ;  Surgeon: Leigh Elspeth SQUIBB, MD;  Location: [facility];  Service: Gastroenterology;  Laterality: N/A;   ESOPHAGOGASTRODUODENOSCOPY (EGD) WITH PROPOFOL  N/A 04/25/2023   Procedure: ESOPHAGOGASTRODUODENOSCOPY (EGD) WITH PROPOFOL ;  Surgeon: Cindie Carlin POUR, DO;  [facility];  Service: Endoscopy;  Laterality: N/A;  10:30 am,  asa 3   GASTROJEJUNOSTOMY N/A 01/03/2021   Procedure: GASTROJEJUNOSTOMY;  Surgeon: Mavis Anes, MD;  Location: [facility];  Service: General;  Laterality: N/A;   HAND SURGERY     SAVORY DILATION N/A 01/05/2020   Procedure: SAVORY DILATION;  Surgeon: Harvey Margo CROME, MD;  Location: [facility];  Service: Endoscopy;  Laterality: N/A;    REVIEW OF SYSTEMS:  A comprehensive review of systems was negative.   PHYSICAL EXAMINATION: General appearance: alert, cooperative, and no distress Head: Normocephalic, without obvious abnormality, atraumatic Neck: no adenopathy, no JVD, supple, symmetrical, trachea midline, and thyroid not enlarged, symmetric, no tenderness/mass/nodules Lymph nodes: Cervical, supraclavicular, and axillary nodes normal. Resp: clear to auscultation bilaterally Back: symmetric, no curvature. ROM normal. No CVA tenderness. Cardio: regular  rate and rhythm, S1, S2 normal, no murmur, click, rub or gallop GI: soft, non-tender; bowel sounds normal; no masses,  no organomegaly Extremities: extremities normal, atraumatic, no cyanosis or edema  ECOG PERFORMANCE STATUS: 1 - Symptomatic but completely ambulatory  Blood pressure (!) 133/54, pulse 66, temperature 97.8 F (36.6 C), temperature source Temporal, resp. rate 17, height 5' 10 (1.778 m), weight 211 lb 4.8 oz (95.8 kg), SpO2 99%.  LABORATORY DATA: Lab Results  Component Value Date   WBC 4.7 10/20/2024   HGB 13.6 10/20/2024   HCT 39.8 10/20/2024   MCV 90.5 10/20/2024   PLT 180 10/20/2024      Chemistry      Component Value Date/Time   NA 137 07/18/2024 0047   NA 136 05/12/2024 1218   K 3.9 07/18/2024 0047   CL 103 07/18/2024 0047   CO2 25 07/18/2024 0047   BUN 12 07/18/2024 0047   BUN 15 05/12/2024 1218   CREATININE 0.92 07/18/2024 0047   CREATININE 0.65 12/14/2022 0832   CREATININE 0.72 10/17/2016 1012      Component Value Date/Time   CALCIUM  9.1 07/18/2024 0047   ALKPHOS 102 09/07/2023 1220   AST 40 09/07/2023 1220   AST 24 12/14/2022 0832   ALT 56 (H) 09/07/2023 1220   ALT 21 12/14/2022 0832   BILITOT <0.2 09/07/2023 1220   BILITOT 0.2 (L) 12/14/2022 0832       RADIOGRAPHIC STUDIES: No results found.  ASSESSMENT AND PLAN: This is a very pleasant 64 years old white male with iron  deficiency anemia secondary to malabsorption secondary to gastrojejunostomy in March 2022. The patient was treated with iron  infusion with Venofer  300 mg IV for 3 doses.  Last dose was given in March 2024.  He is currently on oral iron  tablet with ferrous sulfate  twice daily with orange juice. Repeat CBC today showed no significant anemia or any other abnormality.  Iron  study and ferritin are still pending. 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 Hemoglobin and iron  studies remain within normal limits, and he is asymptomatic without new complaints. Maintains compliance with oral iron  supplementation. No evidence of acute anemia or related complications. - Reviewed recent laboratory results confirming stable hemoglobin and iron  indices. - Recommended continuation of current oral iron  supplementation regimen. - Extended follow-up interval to every six months given well-controlled status. - Instructed to report new symptoms such as significant fatigue or weakness for earlier evaluation. He was advised to call immediately if he has any other concerning symptoms in the interval  All questions were answered. The patient knows to call the clinic with any problems, questions  or concerns. We can certainly see the patient much sooner if necessary. The total time spent in the appointment was 20 minutes.  Disclaimer: This note was dictated with voice recognition software. Similar sounding words can inadvertently be transcribed and may not be corrected upon review.        "

## 2024-10-23 ENCOUNTER — Other Ambulatory Visit: Payer: Self-pay

## 2024-10-23 ENCOUNTER — Other Ambulatory Visit: Payer: Self-pay | Admitting: Internal Medicine

## 2024-10-23 DIAGNOSIS — M62838 Other muscle spasm: Secondary | ICD-10-CM

## 2024-10-24 ENCOUNTER — Encounter: Payer: Self-pay | Admitting: Physician Assistant

## 2024-10-24 ENCOUNTER — Other Ambulatory Visit: Payer: Self-pay

## 2024-10-24 MED ORDER — CYCLOBENZAPRINE HCL 10 MG PO TABS
10.0000 mg | ORAL_TABLET | Freq: Two times a day (BID) | ORAL | 1 refills | Status: AC | PRN
  Filled 2024-10-24: qty 60, 30d supply, fill #0
  Filled ????-??-??: fill #1

## 2024-10-27 ENCOUNTER — Telehealth (INDEPENDENT_AMBULATORY_CARE_PROVIDER_SITE_OTHER): Payer: Self-pay

## 2024-10-27 ENCOUNTER — Ambulatory Visit: Admitting: Internal Medicine

## 2024-10-27 ENCOUNTER — Encounter: Payer: Self-pay | Admitting: Internal Medicine

## 2024-10-27 VITALS — BP 135/74 | HR 71 | Temp 97.3°F | Ht 70.0 in | Wt 207.7 lb

## 2024-10-27 DIAGNOSIS — K5900 Constipation, unspecified: Secondary | ICD-10-CM

## 2024-10-27 DIAGNOSIS — K2100 Gastro-esophageal reflux disease with esophagitis, without bleeding: Secondary | ICD-10-CM

## 2024-10-27 DIAGNOSIS — K2270 Barrett's esophagus without dysplasia: Secondary | ICD-10-CM

## 2024-10-27 DIAGNOSIS — K219 Gastro-esophageal reflux disease without esophagitis: Secondary | ICD-10-CM

## 2024-10-27 DIAGNOSIS — K5909 Other constipation: Secondary | ICD-10-CM

## 2024-10-27 DIAGNOSIS — Z8711 Personal history of peptic ulcer disease: Secondary | ICD-10-CM

## 2024-10-27 DIAGNOSIS — D509 Iron deficiency anemia, unspecified: Secondary | ICD-10-CM | POA: Diagnosis not present

## 2024-10-27 NOTE — Telephone Encounter (Signed)
PA is not required.

## 2024-10-27 NOTE — Progress Notes (Signed)
 "   Referring Provider: Vicci Barnie NOVAK, MD Primary Care Physician:  Vicci Barnie NOVAK, MD Primary GI:  Dr. Cindie  Chief Complaint  Patient presents with   Follow-up    Patient here today as a follow up on GERD, and Barrett's esophagus without dysplasia K31.1 (ICD-10-CM) - Gastric outlet obstruction He is currently taking pantoprazole  40 mg one every other day.  He also uses carafate  prn.     HPI:   Jeremy [name] is a 64 y.o. male who presents to clinic today for follow-up visit.  Last seen in our office 09/03/2023.  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 Mavis on 01/03/2021 with duodenum/duodenal stricture left in place, history of long segment Barrett's esophagus, constipation, IDA following with hematology for IV iron  prn.   Barrett's esophagus: Long segment approximately 10 cm, no history of dysplasia.  Due for surveillance this year.  Iron  deficiency anemia: Secondary to malabsorption status post gastrojejunostomy March 2022.  Following with hematology and receiving iron  infusions.  Chronic GERD: Well-controlled on pantoprazole  every other day.  Denies any dysphagia odynophagia.  No epigastric or chest pain. Take Carafate  as needed.   Most recent procedure history: EGD 04/25/2023 with long segment Barrett's esophagus, gastrojejunostomy characterized by healthy-appearing mucosa, acquired duodenal stenosis dilated, gastritis biopsied. Pathology with reactive gastropathy, chronic gastritis, negative for H. pylori.   EGD 03/11/2023 with large gastric ulceration in the setting of Aleve .  He had been taking Aleve  but discontinued use shortly thereafter.  No evidence of gastrojejunostomy though possibly not visualized due to large amount of food debris.  EGD 01/30/2022: long-segment Barrett's s/p biopsy, gastric bypass with  normal sized puch and intact staple line. GJ anastomosis with healthy mucosa. Gastritis.   Colonoscopy 02/07/19 internal and external hemorrhoids, otherwise WNL, 10 year recall. Reports 1 grandfather with colon cancer, family history otherwise unremarkable.   Today, reports he is doing okay. He states his food does not move well. He has not lost weight, actually gained 5 lbs wince last seen in 2014. Occasional vomiting if he eats too much.   Does complain about constipation. Will have a BM around once per week. When that happens, he will have 2 normal BMs and then subsequent multiple episodes of diarrhea. Was on metamucil in the past which did not help.   Past Medical History:  Diagnosis Date   Diabetes mellitus without complication (HCC)    GERD (gastroesophageal reflux disease)    Heart murmur    Hyperlipidemia    Hypertension    IDA (iron  deficiency anemia)    Rash of entire body 03/2016   Sleep apnea     Past Surgical History:  Procedure Laterality Date   BACK SURGERY  1993   lumbar   BALLOON DILATION N/A 04/25/2023   Procedure: BALLOON DILATION;  Surgeon: Cindie Carlin POUR, DO;  Location: [facility];  Service: Endoscopy;  Laterality: N/A;   BIOPSY  02/07/2019   Procedure: BIOPSY;  Surgeon: Harvey Margo LITTIE, MD;  Location: [facility];  Service: Endoscopy;;   BIOPSY  01/30/2022   Procedure: BIOPSY;  Surgeon: Cindie Carlin POUR, DO;  Location: [facility];  Service: Endoscopy;;   BIOPSY  04/25/2023   Procedure: BIOPSY;  Surgeon: Cindie Carlin POUR, DO;  Location: [facility];  Service: Endoscopy;;   CERVICAL SPINE SURGERY  2000   COLONOSCOPY N/A 02/07/2019  Dr. Harvey: External hemorrhoids next colonoscopy in 10 years   ESOPHAGOGASTRODUODENOSCOPY N/A 02/07/2019   Dr. Harvey: Barrett's esophagus without dysplasia chronic inactive gastritis but no H. 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, nonbleeding duodenal  diverticulum,   ESOPHAGOGASTRODUODENOSCOPY N/A 01/05/2020   Procedure: ESOPHAGOGASTRODUODENOSCOPY (EGD);  Surgeon: Harvey Margo CROME, MD;  Location: [facility];  Service: Endoscopy;  Laterality: N/A;  10:30am   ESOPHAGOGASTRODUODENOSCOPY (EGD) WITH PROPOFOL  N/A 12/06/2020   Procedure: ESOPHAGOGASTRODUODENOSCOPY (EGD) WITH PROPOFOL ;  Surgeon: Cindie Carlin POUR, DO;  Location: [facility];  Service: Endoscopy;  Laterality: N/A;  11:45am   ESOPHAGOGASTRODUODENOSCOPY (EGD) WITH PROPOFOL  N/A 12/22/2021   long-segment Barrett's s/p biopsy, gastric bypass with normal sized puch and intact staple line. GJ anastomosis with healthy mucosa. Gastritis.   ESOPHAGOGASTRODUODENOSCOPY (EGD) WITH PROPOFOL  N/A 01/30/2022   Procedure: ESOPHAGOGASTRODUODENOSCOPY (EGD) WITH PROPOFOL ;  Surgeon: Cindie Carlin POUR, DO;  Location: [facility];  Service: Endoscopy;  Laterality: N/A;  11:00am   ESOPHAGOGASTRODUODENOSCOPY (EGD) WITH PROPOFOL  N/A 03/11/2023   Procedure: ESOPHAGOGASTRODUODENOSCOPY (EGD) WITH PROPOFOL ;  Surgeon: Leigh Elspeth SQUIBB, MD;  Location: [facility];  Service: Gastroenterology;  Laterality: N/A;   ESOPHAGOGASTRODUODENOSCOPY (EGD) WITH PROPOFOL  N/A 04/25/2023   Procedure: ESOPHAGOGASTRODUODENOSCOPY (EGD) WITH PROPOFOL ;  Surgeon: Cindie Carlin POUR, DO;  Location: [facility];  Service: Endoscopy;  Laterality: N/A;  10:30 am, asa 3   GASTROJEJUNOSTOMY N/A 01/03/2021   Procedure: GASTROJEJUNOSTOMY;  Surgeon: Mavis Anes, MD;  Location: [facility];  Service: General;  Laterality: N/A;   HAND SURGERY     SAVORY DILATION N/A 01/05/2020   Procedure: SAVORY DILATION;  Surgeon: Harvey Margo CROME, MD;  Location: [facility];  Service: Endoscopy;  Laterality: N/A;    Current Outpatient Medications  Medication Sig Dispense Refill   Accu-Chek Softclix Lancets lancets Use to check blood sugar three times daily. 100 each 2   acetaminophen  (TYLENOL ) 500 MG tablet Take 1,000 mg by mouth in the morning and at  bedtime.  (Patient taking differently: Take 1,000 mg by mouth daily at 6 (six) AM.)     amLODipine  (NORVASC ) 5 MG tablet Take 1 tablet (5 mg total) by mouth daily. 90 tablet 1   ascorbic acid (VITAMIN C) 500 MG tablet Take 500 mg by mouth 2 (two) times daily.     Blood Glucose Monitoring Suppl (ACCU-CHEK GUIDE) w/Device KIT Use to check blood sugar three times daily. 1 kit 0   Cyanocobalamin  (VITAMIN B-12 PO) Take 2,500 mcg by mouth daily.     cyclobenzaprine  (FLEXERIL ) 10 MG tablet Take 1 tablet (10 mg total) by mouth 2 (two) times daily as needed for muscle spasms. (Patient taking differently: Take 10 mg by mouth daily at 6 (six) AM.) 180 tablet 1   ferrous sulfate  (FEROSUL) 325 (65 FE) MG tablet Take 1 tablet (325 mg total) by mouth 2 (two) times daily with a meal. 180 tablet 1   fluticasone  (FLONASE ) 50 MCG/ACT nasal spray Place 1 spray into both nostrils daily as needed for allergies or rhinitis. 16 g 2   glipiZIDE  (GLUCOTROL  XL) 5 MG 24 hr tablet Take 1 tablet (5 mg total) by mouth daily with breakfast. 90 tablet 1   lisinopril  (ZESTRIL ) 20 MG tablet Take 1 tablet (20 mg total) by mouth in the morning and at bedtime. 180 tablet 1   loratadine  (CLARITIN ) 10 MG tablet Take 10 mg by mouth daily.     Magnesium  Oxide 200 MG TABS Take  600 mg by mouth in the morning and at bedtime.     Multiple Vitamins-Minerals (MULTIVITAMIN WITH MINERALS) tablet Take 1 tablet by mouth 2 (two) times daily. (Patient taking differently: Take 1 tablet by mouth daily.)     ondansetron  (ZOFRAN ) 8 MG tablet Take 1 tablet (8 mg total) by mouth daily as needed for nausea or vomiting. 30 tablet 2   pantoprazole  (PROTONIX ) 40 MG tablet Take 1 tablet (40 mg total) by mouth 2 (two) times daily before a meal. (Patient taking differently: Take 40 mg by mouth. Every other day.) 60 tablet 5   Potassium 99 MG TABS Take 198 mg by mouth 2 (two) times daily.     rOPINIRole  (REQUIP ) 1 MG tablet Take 1 tablet (1 mg total) by mouth at  bedtime. 90 tablet 1   sildenafil  (VIAGRA ) 50 MG tablet Take 1 tablet (50 mg total) by mouth 1/2 hr before intercourse.  Limit use to 1 tab/24 hr 10 tablet 6   sucralfate  (CARAFATE ) 1 GM/10ML suspension Take 10 mLs (1 g total) by mouth 2 (two) times daily. (Patient taking differently: Take 1 g by mouth as needed.) 600 mL 2   traMADol  (ULTRAM ) 50 MG tablet Take 2 tablets (100 mg total) by mouth every 12 (twelve) hours as needed (to fill on or after 05/20/2023). 120 tablet 2   sodium chloride  1 g tablet Take 1 tablet (1 g total) by mouth daily. (Patient not taking: Reported on 10/27/2024) 30 tablet 3   No current facility-administered medications for this visit.    Allergies as of 10/27/2024 - Review Complete 10/27/2024  Allergen Reaction Noted   Cymbalta  [duloxetine  hcl] Other (See Comments) 06/05/2017   Doxycycline   03/02/2021   Other Nausea And Vomiting 01/24/2019   Robaxin  [methocarbamol ] Other (See Comments) 06/02/2019    Family History  Problem Relation Age of Onset   Breast cancer Mother    Pancreatic cancer Mother    CAD Father    Colon cancer Paternal Grandfather     Social History   Socioeconomic History   Marital status: Significant Other    Spouse name: Not on file   Number of children: Not on file   Years of education: Not on file   Highest education level: Not on file  Occupational History   Occupation: product/process development scientist  Tobacco Use   Smoking status: Former    Current packs/day: 0.00    Types: Cigarettes    Quit date: 10/16/1988    Years since quitting: 36.0   Smokeless tobacco: Never  Vaping Use   Vaping status: Never Used  Substance and Sexual Activity   Alcohol use: No   Drug use: Yes    Comment: rare   Sexual activity: Not on file  Other Topics Concern   Not on file  Social History Narrative   Volunteers as it sales professional   Social Drivers of Health   Tobacco Use: Medium Risk (10/27/2024)   Patient History    Smoking Tobacco Use: Former    Smokeless  Tobacco Use: Never    Passive Exposure: Not on file  Financial Resource Strain: Medium Risk (02/21/2024)   Overall Financial Resource Strain (CARDIA)    Difficulty of Paying Living Expenses: Somewhat hard  Food Insecurity: Food Insecurity Present (02/21/2024)   Hunger Vital Sign    Worried About Running Out of Food in the Last Year: Never true    Ran Out of Food in the Last Year: Sometimes true  Transportation Needs: No Transportation Needs (02/21/2024)  PRAPARE - Administrator, Civil Service (Medical): No    Lack of Transportation (Non-Medical): No  Physical Activity: Insufficiently Active (02/21/2024)   Exercise Vital Sign    Days of Exercise per Week: 1 day    Minutes of Exercise per Session: 20 min  Stress: No Stress Concern Present (02/21/2024)   [employer] of Occupational Health - Occupational Stress Questionnaire    Feeling of Stress : Only a little  Social Connections: Socially Isolated (02/21/2024)   Social Connection and Isolation Panel    Frequency of Communication with Friends and Family: Once a week    Frequency of Social Gatherings with Friends and Family: Once a week    Attends Religious Services: More than 4 times per year    Active Member of Clubs or Organizations: No    Attends Banker Meetings: Never    Marital Status: Separated  Depression (PHQ2-9): Low Risk (02/21/2024)   Depression (PHQ2-9)    PHQ-2 Score: 2  Alcohol Screen: Low Risk (02/21/2024)   Alcohol Screen    Last Alcohol Screening Score (AUDIT): 0  Housing: Low Risk (02/21/2024)   Housing Stability Vital Sign    Unable to Pay for Housing in the Last Year: No    Number of Times Moved in the Last Year: 0    Homeless in the Last Year: No  Utilities: Not At Risk (02/21/2024)   [facility] Utilities    Threatened with loss of utilities: No  Health Literacy: Adequate Health Literacy (02/21/2024)   B1300 Health Literacy    Frequency of need for help with medical instructions: Never     Subjective: Review of Systems  Constitutional:  Negative for chills and fever.  HENT:  Negative for congestion and hearing loss.   Eyes:  Negative for blurred vision and double vision.  Respiratory:  Negative for cough and shortness of breath.   Cardiovascular:  Negative for chest pain and palpitations.  Gastrointestinal:  Negative for abdominal pain, blood in stool, constipation, diarrhea, heartburn, melena and vomiting.  Genitourinary:  Negative for dysuria and urgency.  Musculoskeletal:  Negative for joint pain and myalgias.  Skin:  Negative for itching and rash.  Neurological:  Negative for dizziness and headaches.  Psychiatric/Behavioral:  Negative for depression. The patient is not nervous/anxious.      Objective: BP 135/74 (BP Location: Left Arm, Patient Position: Sitting, Cuff Size: Normal)   Pulse 71   Temp (!) 97.3 F (36.3 C) (Temporal)   Ht 5' 10 (1.778 m)   Wt 207 lb 11.2 oz (94.2 kg)   BMI 29.80 kg/m  Physical Exam Constitutional:      Appearance: Normal appearance.  HENT:     Head: Normocephalic and atraumatic.  Eyes:     Extraocular Movements: Extraocular movements intact.     Conjunctiva/sclera: Conjunctivae normal.  Cardiovascular:     Rate and Rhythm: Normal rate and regular rhythm.  Pulmonary:     Effort: Pulmonary effort is normal.     Breath sounds: Normal breath sounds.  Abdominal:     General: Bowel sounds are normal.     Palpations: Abdomen is soft.  Musculoskeletal:        General: Normal range of motion.     Cervical back: Normal range of motion and neck supple.  Skin:    General: Skin is warm.  Neurological:     General: No focal deficit present.     Mental Status: He is alert and oriented to person, place,  and time.  Psychiatric:        Mood and Affect: Mood normal.        Behavior: Behavior normal.      Assessment/Plan:  1.  Barrett's esophagus-due for surveillance EGD this year.  Will schedule today. The risks including  infection, bleed, or perforation as well as benefits, limitations, alternatives and imponderables have been reviewed with the patient. Potential for esophageal dilation, biopsy, etc. have also been reviewed.  Questions have been answered. All parties agreeable.  Will need to be on clear liquid diet x2 days prior to procedure.   Counseled on taking his pantoprazole  daily instead of every other day  2.  Iron  deficiency anemia-secondary to malabsorption status post gastrojejunostomy March 2022.  Continue to follow with hematology, receiving iron  infusions as needed.  3.  Chronic GERD-well-controlled on pantoprazole  every other day.  Given his Barrett's esophagus, needs to take this daily.  Counseled on avoiding NSAIDs.  4. Chronic constipation- For patient's constipation, I recommended taking MiraLAX  1 capful daily.  If this is not adequate then increase to 2 capfuls daily.  If this is still not adequate then add on once daily Dulcolax.  Encouraged to drink at least 6 glasses of water  daily.   Follow-up 6 months.  10/27/2024 10:19 AM  "

## 2024-10-27 NOTE — H&P (View-Only) (Signed)
 "   Referring Provider: Vicci Barnie NOVAK, MD Primary Care Physician:  Vicci Barnie NOVAK, MD Primary GI:  Dr. Cindie  Chief Complaint  Patient presents with   Follow-up    Patient here today as a follow up on GERD, and Barrett's esophagus without dysplasia K31.1 (ICD-10-CM) - Gastric outlet obstruction He is currently taking pantoprazole  40 mg one every other day.  He also uses carafate  prn.     HPI:   Jeremy [name] is a 64 y.o. male who presents to clinic today for follow-up visit.  Last seen in our office 09/03/2023.  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 Mavis on 01/03/2021 with duodenum/duodenal stricture left in place, history of long segment Barrett's esophagus, constipation, IDA following with hematology for IV iron  prn.   Barrett's esophagus: Long segment approximately 10 cm, no history of dysplasia.  Due for surveillance this year.  Iron  deficiency anemia: Secondary to malabsorption status post gastrojejunostomy March 2022.  Following with hematology and receiving iron  infusions.  Chronic GERD: Well-controlled on pantoprazole  every other day.  Denies any dysphagia odynophagia.  No epigastric or chest pain. Take Carafate  as needed.   Most recent procedure history: EGD 04/25/2023 with long segment Barrett's esophagus, gastrojejunostomy characterized by healthy-appearing mucosa, acquired duodenal stenosis dilated, gastritis biopsied. Pathology with reactive gastropathy, chronic gastritis, negative for H. pylori.   EGD 03/11/2023 with large gastric ulceration in the setting of Aleve .  He had been taking Aleve  but discontinued use shortly thereafter.  No evidence of gastrojejunostomy though possibly not visualized due to large amount of food debris.  EGD 01/30/2022: long-segment Barrett's s/p biopsy, gastric bypass with  normal sized puch and intact staple line. GJ anastomosis with healthy mucosa. Gastritis.   Colonoscopy 02/07/19 internal and external hemorrhoids, otherwise WNL, 10 year recall. Reports 1 grandfather with colon cancer, family history otherwise unremarkable.   Today, reports he is doing okay. He states his food does not move well. He has not lost weight, actually gained 5 lbs wince last seen in 2014. Occasional vomiting if he eats too much.   Does complain about constipation. Will have a BM around once per week. When that happens, he will have 2 normal BMs and then subsequent multiple episodes of diarrhea. Was on metamucil in the past which did not help.   Past Medical History:  Diagnosis Date   Diabetes mellitus without complication (HCC)    GERD (gastroesophageal reflux disease)    Heart murmur    Hyperlipidemia    Hypertension    IDA (iron  deficiency anemia)    Rash of entire body 03/2016   Sleep apnea     Past Surgical History:  Procedure Laterality Date   BACK SURGERY  1993   lumbar   BALLOON DILATION N/A 04/25/2023   Procedure: BALLOON DILATION;  Surgeon: Cindie Carlin POUR, DO;  Location: [facility];  Service: Endoscopy;  Laterality: N/A;   BIOPSY  02/07/2019   Procedure: BIOPSY;  Surgeon: Harvey Margo LITTIE, MD;  Location: [facility];  Service: Endoscopy;;   BIOPSY  01/30/2022   Procedure: BIOPSY;  Surgeon: Cindie Carlin POUR, DO;  Location: [facility];  Service: Endoscopy;;   BIOPSY  04/25/2023   Procedure: BIOPSY;  Surgeon: Cindie Carlin POUR, DO;  Location: [facility];  Service: Endoscopy;;   CERVICAL SPINE SURGERY  2000   COLONOSCOPY N/A 02/07/2019  Dr. Harvey: External hemorrhoids next colonoscopy in 10 years   ESOPHAGOGASTRODUODENOSCOPY N/A 02/07/2019   Dr. Harvey: Barrett's esophagus without dysplasia chronic inactive gastritis but no H. 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, nonbleeding duodenal  diverticulum,   ESOPHAGOGASTRODUODENOSCOPY N/A 01/05/2020   Procedure: ESOPHAGOGASTRODUODENOSCOPY (EGD);  Surgeon: Harvey Margo CROME, MD;  Location: [facility];  Service: Endoscopy;  Laterality: N/A;  10:30am   ESOPHAGOGASTRODUODENOSCOPY (EGD) WITH PROPOFOL  N/A 12/06/2020   Procedure: ESOPHAGOGASTRODUODENOSCOPY (EGD) WITH PROPOFOL ;  Surgeon: Cindie Carlin POUR, DO;  Location: [facility];  Service: Endoscopy;  Laterality: N/A;  11:45am   ESOPHAGOGASTRODUODENOSCOPY (EGD) WITH PROPOFOL  N/A 12/22/2021   long-segment Barrett's s/p biopsy, gastric bypass with normal sized puch and intact staple line. GJ anastomosis with healthy mucosa. Gastritis.   ESOPHAGOGASTRODUODENOSCOPY (EGD) WITH PROPOFOL  N/A 01/30/2022   Procedure: ESOPHAGOGASTRODUODENOSCOPY (EGD) WITH PROPOFOL ;  Surgeon: Cindie Carlin POUR, DO;  [facility]: [facility];  Service: Endoscopy;  Laterality: N/A;  11:00am   ESOPHAGOGASTRODUODENOSCOPY (EGD) WITH PROPOFOL  N/A 03/11/2023   Procedure: ESOPHAGOGASTRODUODENOSCOPY (EGD) WITH PROPOFOL ;  Surgeon: Leigh Elspeth SQUIBB, MD;  Location: [facility];  Service: Gastroenterology;  Laterality: N/A;   ESOPHAGOGASTRODUODENOSCOPY (EGD) WITH PROPOFOL  N/A 04/25/2023   Procedure: ESOPHAGOGASTRODUODENOSCOPY (EGD) WITH PROPOFOL ;  Surgeon: Cindie Carlin POUR, DO;  [facility]: [facility];  Service: Endoscopy;  Laterality: N/A;  10:30 am, asa 3   GASTROJEJUNOSTOMY N/A 01/03/2021   Procedure: GASTROJEJUNOSTOMY;  Surgeon: Mavis Anes, MD;  Location: [facility];  Service: General;  Laterality: N/A;   HAND SURGERY     SAVORY DILATION N/A 01/05/2020   Procedure: SAVORY DILATION;  Surgeon: Harvey Margo CROME, MD;  Location: [facility];  Service: Endoscopy;  Laterality: N/A;    Current Outpatient Medications  Medication Sig Dispense Refill   Accu-Chek Softclix Lancets lancets Use to check blood sugar three times daily. 100 each 2   acetaminophen  (TYLENOL ) 500 MG tablet Take 1,000 mg by mouth in the morning and at  bedtime.  (Patient taking differently: Take 1,000 mg by mouth daily at 6 (six) AM.)     amLODipine  (NORVASC ) 5 MG tablet Take 1 tablet (5 mg total) by mouth daily. 90 tablet 1   ascorbic acid (VITAMIN C) 500 MG tablet Take 500 mg by mouth 2 (two) times daily.     Blood Glucose Monitoring Suppl (ACCU-CHEK GUIDE) w/Device KIT Use to check blood sugar three times daily. 1 kit 0   Cyanocobalamin  (VITAMIN B-12 PO) Take 2,500 mcg by mouth daily.     cyclobenzaprine  (FLEXERIL ) 10 MG tablet Take 1 tablet (10 mg total) by mouth 2 (two) times daily as needed for muscle spasms. (Patient taking differently: Take 10 mg by mouth daily at 6 (six) AM.) 180 tablet 1   ferrous sulfate  (FEROSUL) 325 (65 FE) MG tablet Take 1 tablet (325 mg total) by mouth 2 (two) times daily with a meal. 180 tablet 1   fluticasone  (FLONASE ) 50 MCG/ACT nasal spray Place 1 spray into both nostrils daily as needed for allergies or rhinitis. 16 g 2   glipiZIDE  (GLUCOTROL  XL) 5 MG 24 hr tablet Take 1 tablet (5 mg total) by mouth daily with breakfast. 90 tablet 1   lisinopril  (ZESTRIL ) 20 MG tablet Take 1 tablet (20 mg total) by mouth in the morning and at bedtime. 180 tablet 1   loratadine  (CLARITIN ) 10 MG tablet Take 10 mg by mouth daily.     Magnesium  Oxide 200 MG TABS Take  600 mg by mouth in the morning and at bedtime.     Multiple Vitamins-Minerals (MULTIVITAMIN WITH MINERALS) tablet Take 1 tablet by mouth 2 (two) times daily. (Patient taking differently: Take 1 tablet by mouth daily.)     ondansetron  (ZOFRAN ) 8 MG tablet Take 1 tablet (8 mg total) by mouth daily as needed for nausea or vomiting. 30 tablet 2   pantoprazole  (PROTONIX ) 40 MG tablet Take 1 tablet (40 mg total) by mouth 2 (two) times daily before a meal. (Patient taking differently: Take 40 mg by mouth. Every other day.) 60 tablet 5   Potassium 99 MG TABS Take 198 mg by mouth 2 (two) times daily.     rOPINIRole  (REQUIP ) 1 MG tablet Take 1 tablet (1 mg total) by mouth at  bedtime. 90 tablet 1   sildenafil  (VIAGRA ) 50 MG tablet Take 1 tablet (50 mg total) by mouth 1/2 hr before intercourse.  Limit use to 1 tab/24 hr 10 tablet 6   sucralfate  (CARAFATE ) 1 GM/10ML suspension Take 10 mLs (1 g total) by mouth 2 (two) times daily. (Patient taking differently: Take 1 g by mouth as needed.) 600 mL 2   traMADol  (ULTRAM ) 50 MG tablet Take 2 tablets (100 mg total) by mouth every 12 (twelve) hours as needed (to fill on or after 05/20/2023). 120 tablet 2   sodium chloride  1 g tablet Take 1 tablet (1 g total) by mouth daily. (Patient not taking: Reported on 10/27/2024) 30 tablet 3   No current facility-administered medications for this visit.    Allergies as of 10/27/2024 - Review Complete 10/27/2024  Allergen Reaction Noted   Cymbalta  [duloxetine  hcl] Other (See Comments) 06/05/2017   Doxycycline   03/02/2021   Other Nausea And Vomiting 01/24/2019   Robaxin  [methocarbamol ] Other (See Comments) 06/02/2019    Family History  Problem Relation Age of Onset   Breast cancer Mother    Pancreatic cancer Mother    CAD Father    Colon cancer Paternal Grandfather     Social History   Socioeconomic History   Marital status: Significant Other    Spouse name: Not on file   Number of children: Not on file   Years of education: Not on file   Highest education level: Not on file  Occupational History   Occupation: product/process development scientist  Tobacco Use   Smoking status: Former    Current packs/day: 0.00    Types: Cigarettes    Quit date: 10/16/1988    Years since quitting: 36.0   Smokeless tobacco: Never  Vaping Use   Vaping status: Never Used  Substance and Sexual Activity   Alcohol use: No   Drug use: Yes    Comment: rare   Sexual activity: Not on file  Other Topics Concern   Not on file  Social History Narrative   Volunteers as it sales professional   Social Drivers of Health   Tobacco Use: Medium Risk (10/27/2024)   Patient History    Smoking Tobacco Use: Former    Smokeless  Tobacco Use: Never    Passive Exposure: Not on file  Financial Resource Strain: Medium Risk (02/21/2024)   Overall Financial Resource Strain (CARDIA)    Difficulty of Paying Living Expenses: Somewhat hard  Food Insecurity: Food Insecurity Present (02/21/2024)   Hunger Vital Sign    Worried About Running Out of Food in the Last Year: Never true    Ran Out of Food in the Last Year: Sometimes true  Transportation Needs: No Transportation Needs (02/21/2024)  PRAPARE - Administrator, Civil Service (Medical): No    Lack of Transportation (Non-Medical): No  Physical Activity: Insufficiently Active (02/21/2024)   Exercise Vital Sign    Days of Exercise per Week: 1 day    Minutes of Exercise per Session: 20 min  Stress: No Stress Concern Present (02/21/2024)   [employer] of Occupational Health - Occupational Stress Questionnaire    Feeling of Stress : Only a little  Social Connections: Socially Isolated (02/21/2024)   Social Connection and Isolation Panel    Frequency of Communication with Friends and Family: Once a week    Frequency of Social Gatherings with Friends and Family: Once a week    Attends Religious Services: More than 4 times per year    Active Member of Clubs or Organizations: No    Attends Banker Meetings: Never    Marital Status: Separated  Depression (PHQ2-9): Low Risk (02/21/2024)   Depression (PHQ2-9)    PHQ-2 Score: 2  Alcohol Screen: Low Risk (02/21/2024)   Alcohol Screen    Last Alcohol Screening Score (AUDIT): 0  Housing: Low Risk (02/21/2024)   Housing Stability Vital Sign    Unable to Pay for Housing in the Last Year: No    Number of Times Moved in the Last Year: 0    Homeless in the Last Year: No  Utilities: Not At Risk (02/21/2024)   AHC Utilities    Threatened with loss of utilities: No  Health Literacy: Adequate Health Literacy (02/21/2024)   B1300 Health Literacy    Frequency of need for help with medical instructions: Never     Subjective: Review of Systems  Constitutional:  Negative for chills and fever.  HENT:  Negative for congestion and hearing loss.   Eyes:  Negative for blurred vision and double vision.  Respiratory:  Negative for cough and shortness of breath.   Cardiovascular:  Negative for chest pain and palpitations.  Gastrointestinal:  Negative for abdominal pain, blood in stool, constipation, diarrhea, heartburn, melena and vomiting.  Genitourinary:  Negative for dysuria and urgency.  Musculoskeletal:  Negative for joint pain and myalgias.  Skin:  Negative for itching and rash.  Neurological:  Negative for dizziness and headaches.  Psychiatric/Behavioral:  Negative for depression. The patient is not nervous/anxious.      Objective: BP 135/74 (BP Location: Left Arm, Patient Position: Sitting, Cuff Size: Normal)   Pulse 71   Temp (!) 97.3 F (36.3 C) (Temporal)   Ht 5' 10 (1.778 m)   Wt 207 lb 11.2 oz (94.2 kg)   BMI 29.80 kg/m  Physical Exam Constitutional:      Appearance: Normal appearance.  HENT:     Head: Normocephalic and atraumatic.  Eyes:     Extraocular Movements: Extraocular movements intact.     Conjunctiva/sclera: Conjunctivae normal.  Cardiovascular:     Rate and Rhythm: Normal rate and regular rhythm.  Pulmonary:     Effort: Pulmonary effort is normal.     Breath sounds: Normal breath sounds.  Abdominal:     General: Bowel sounds are normal.     Palpations: Abdomen is soft.  Musculoskeletal:        General: Normal range of motion.     Cervical back: Normal range of motion and neck supple.  Skin:    General: Skin is warm.  Neurological:     General: No focal deficit present.     Mental Status: He is alert and oriented to person, place,  and time.  Psychiatric:        Mood and Affect: Mood normal.        Behavior: Behavior normal.      Assessment/Plan:  1.  Barrett's esophagus-due for surveillance EGD this year.  Will schedule today. The risks including  infection, bleed, or perforation as well as benefits, limitations, alternatives and imponderables have been reviewed with the patient. Potential for esophageal dilation, biopsy, etc. have also been reviewed.  Questions have been answered. All parties agreeable.  Will need to be on clear liquid diet x2 days prior to procedure.   Counseled on taking his pantoprazole  daily instead of every other day  2.  Iron  deficiency anemia-secondary to malabsorption status post gastrojejunostomy March 2022.  Continue to follow with hematology, receiving iron  infusions as needed.  3.  Chronic GERD-well-controlled on pantoprazole  every other day.  Given his Barrett's esophagus, needs to take this daily.  Counseled on avoiding NSAIDs.  4. Chronic constipation- For patient's constipation, I recommended taking MiraLAX  1 capful daily.  If this is not adequate then increase to 2 capfuls daily.  If this is still not adequate then add on once daily Dulcolax.  Encouraged to drink at least 6 glasses of water  daily.   Follow-up 6 months.  10/27/2024 10:19 AM  "

## 2024-10-27 NOTE — Patient Instructions (Signed)
 We will schedule you for EGD for surveillance purposes given your history of Barrett's esophagus.   For your constipation, I want you to start taking over the counter MiraLAX  1 capful daily.  If this does not adequately control your constipation, I would increase to 2 capfuls daily.  If this is still not adequate, then I would add on once daily Dulcolax (bisacodyl) tablet.   Be sure to drink at least 4 to 6 glasses of water  daily.   Follow up after EGD.   It was very nice to see you again today.   Dr. Cindie

## 2024-10-27 NOTE — Telephone Encounter (Signed)
 Spoke with patient in the office, scheduled EGD for 11/05/2024 at 12:30pm. Instruction packet given to patient.

## 2024-10-28 ENCOUNTER — Other Ambulatory Visit: Payer: Self-pay

## 2024-10-29 ENCOUNTER — Other Ambulatory Visit: Payer: Self-pay | Admitting: Internal Medicine

## 2024-10-29 ENCOUNTER — Encounter: Payer: Self-pay | Admitting: Physician Assistant

## 2024-10-29 ENCOUNTER — Other Ambulatory Visit: Payer: Self-pay

## 2024-10-29 DIAGNOSIS — I152 Hypertension secondary to endocrine disorders: Secondary | ICD-10-CM

## 2024-10-29 MED ORDER — SODIUM CHLORIDE 1 G PO TABS
1.0000 g | ORAL_TABLET | Freq: Every day | ORAL | 3 refills | Status: AC
  Filled ????-??-??: fill #0

## 2024-10-29 MED ORDER — AMLODIPINE BESYLATE 5 MG PO TABS
5.0000 mg | ORAL_TABLET | Freq: Every day | ORAL | 1 refills | Status: AC
  Filled ????-??-??: fill #0

## 2024-10-29 MED ORDER — TRAMADOL HCL 50 MG PO TABS
100.0000 mg | ORAL_TABLET | Freq: Two times a day (BID) | ORAL | 2 refills | Status: AC | PRN
  Filled ????-??-??: fill #0

## 2024-11-05 ENCOUNTER — Other Ambulatory Visit: Payer: Self-pay

## 2024-11-05 ENCOUNTER — Encounter (HOSPITAL_COMMUNITY): Payer: Self-pay | Admitting: Internal Medicine

## 2024-11-05 ENCOUNTER — Ambulatory Visit (HOSPITAL_COMMUNITY)
Admission: RE | Admit: 2024-11-05 | Discharge: 2024-11-05 | Disposition: A | Discharge status: Home / Self Care | Attending: Internal Medicine | Admitting: Internal Medicine

## 2024-11-05 ENCOUNTER — Ambulatory Visit (HOSPITAL_COMMUNITY): Admitting: Anesthesiology

## 2024-11-05 ENCOUNTER — Encounter (HOSPITAL_COMMUNITY)
Admission: RE | Disposition: A | Discharge status: Home / Self Care | Payer: Self-pay | Source: Home / Self Care | Attending: Internal Medicine

## 2024-11-05 DIAGNOSIS — K2950 Unspecified chronic gastritis without bleeding: Secondary | ICD-10-CM | POA: Diagnosis not present

## 2024-11-05 DIAGNOSIS — Z87891 Personal history of nicotine dependence: Secondary | ICD-10-CM | POA: Insufficient documentation

## 2024-11-05 DIAGNOSIS — K2270 Barrett's esophagus without dysplasia: Secondary | ICD-10-CM | POA: Diagnosis present

## 2024-11-05 DIAGNOSIS — K2100 Gastro-esophageal reflux disease with esophagitis, without bleeding: Secondary | ICD-10-CM | POA: Diagnosis not present

## 2024-11-05 DIAGNOSIS — Z980 Intestinal bypass and anastomosis status: Secondary | ICD-10-CM | POA: Diagnosis not present

## 2024-11-05 DIAGNOSIS — E119 Type 2 diabetes mellitus without complications: Secondary | ICD-10-CM | POA: Insufficient documentation

## 2024-11-05 DIAGNOSIS — K315 Obstruction of duodenum: Secondary | ICD-10-CM | POA: Insufficient documentation

## 2024-11-05 DIAGNOSIS — Z931 Gastrostomy status: Secondary | ICD-10-CM | POA: Diagnosis not present

## 2024-11-05 DIAGNOSIS — I10 Essential (primary) hypertension: Secondary | ICD-10-CM | POA: Diagnosis not present

## 2024-11-05 DIAGNOSIS — G4730 Sleep apnea, unspecified: Secondary | ICD-10-CM | POA: Insufficient documentation

## 2024-11-05 DIAGNOSIS — I2510 Atherosclerotic heart disease of native coronary artery without angina pectoris: Secondary | ICD-10-CM | POA: Insufficient documentation

## 2024-11-05 DIAGNOSIS — M1990 Unspecified osteoarthritis, unspecified site: Secondary | ICD-10-CM | POA: Insufficient documentation

## 2024-11-05 DIAGNOSIS — K279 Peptic ulcer, site unspecified, unspecified as acute or chronic, without hemorrhage or perforation: Secondary | ICD-10-CM | POA: Diagnosis not present

## 2024-11-05 DIAGNOSIS — D649 Anemia, unspecified: Secondary | ICD-10-CM | POA: Insufficient documentation

## 2024-11-05 DIAGNOSIS — Z9884 Bariatric surgery status: Secondary | ICD-10-CM | POA: Insufficient documentation

## 2024-11-05 DIAGNOSIS — Z13810 Encounter for screening for upper gastrointestinal disorder: Secondary | ICD-10-CM

## 2024-11-05 HISTORY — PX: ESOPHAGOGASTRODUODENOSCOPY: SHX5428

## 2024-11-05 LAB — GLUCOSE, CAPILLARY: Glucose-Capillary: 86 mg/dL (ref 70–99)

## 2024-11-05 MED ORDER — LACTATED RINGERS IV SOLN: INTRAVENOUS | Status: DC

## 2024-11-05 MED ORDER — PROPOFOL 500 MG/50ML IV EMUL
INTRAVENOUS | Status: DC | PRN
  Administered 2024-11-05: 100 mg via INTRAVENOUS
  Administered 2024-11-05: 150 ug/kg/min via INTRAVENOUS

## 2024-11-05 MED ORDER — LIDOCAINE 2% (20 MG/ML) 5 ML SYRINGE
INTRAMUSCULAR | Status: DC | PRN
  Administered 2024-11-05: 60 mg via INTRAVENOUS

## 2024-11-05 MED ORDER — LACTATED RINGERS IV SOLN: INTRAVENOUS | Status: DC | PRN

## 2024-11-05 NOTE — Transfer of Care (Signed)
 Immediate Anesthesia Transfer of Care Note  Patient: Jeremy [name]  Procedure(s) Performed: EGD (ESOPHAGOGASTRODUODENOSCOPY)  Patient Location: Endoscopy Unit  Anesthesia Type:General  Level of Consciousness: drowsy  Airway & Oxygen Therapy: Patient Spontanous Breathing  Post-op Assessment: Report given to RN and Post -op Vital signs reviewed and stable  Post vital signs: Reviewed and stable  Last Vitals:  Vitals Value Taken Time  BP    Temp    Pulse    Resp    SpO2      Last Pain:  Vitals:   11/05/24 1158  TempSrc:   PainSc: 0-No pain      Patients Stated Pain Goal: 7 (11/05/24 1124)  Complications: No notable events documented.

## 2024-11-05 NOTE — Op Note (Signed)
 [facility] Patient Name: Jeremy [name] Procedure Date: 11/05/2024 11:58 AM MRN: [id number] Date of Birth: 22-Jul-1961 Attending MD: Carlin POUR. Cindie , [place], [id number] CSN: [id number] Age: 64 Admit Type: Outpatient Procedure:                Upper GI endoscopy Indications:              Surveillance procedure, Barrett's esophagus Providers:                Carlin POUR. Cindie, DO, Devere Lodge, Alm Dorcas Balm., Technician Referring MD:              Medicines:                See the Anesthesia note for documentation of the                            administered medications Complications:            No immediate complications. Estimated Blood Loss:     Estimated blood loss was minimal. Procedure:                Pre-Anesthesia Assessment:                           - The anesthesia plan was to use monitored                            anesthesia care (MAC).                           After obtaining informed consent, the endoscope was                            passed under direct vision. Throughout the                            procedure, the patient's blood pressure, pulse, and                            oxygen saturations were monitored continuously. The                            Endoscope was introduced through the mouth, and                            advanced to the efferent jejunal loop. The upper GI                            endoscopy was accomplished without difficulty. The                            patient tolerated the procedure well. Scope In: 12:04:11 PM Scope Out: 12:14:11 PM Total Procedure Duration: 0 hours 10 minutes 0 seconds  Findings:      The esophagus and gastroesophageal junction were examined with  white       light and narrow band imaging (NBI) from a forward view and retroflexed       position. There were esophageal mucosal changes secondary to established       long-segment Barrett's disease. These changes involved the mucosa at  the       upper extent of the gastric folds (40 cm from the incisors) extending to       the Z-line (30 cm from the incisors). Circumferential salmon-colored       mucosa was present. The maximum longitudinal extent of these esophageal       mucosal changes was 10 cm in length. Mucosa was biopsied with a cold       forceps for histology in 4 quadrants at intervals of 2 cm. The following       biopsy specimens were sent to pathology: A total of 5 specimen bottles       were sent to pathology.      LA Grade C (one or more mucosal breaks continuous between tops of 2 or       more mucosal folds, less than 75% circumference) esophagitis with no       bleeding was found in the middle third of the esophagus.      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 This was       characterized by healthy appearing mucosa. Examined jejunum appeared       normal      An acquired benign-appearing, intrinsic severe stenosis was found in the       first portion of the duodenum and was non-traversed. Impression:               - Esophageal mucosal changes secondary to                            established long-segment Barrett's disease.                            Biopsied.                           - LA Grade C reflux esophagitis with no bleeding.                           - A gastrojejunostomy was found, characterized by                            healthy appearing mucosa.                           - Acquired duodenal stenosis. Moderate Sedation:      Per Anesthesia Care Recommendation:           - Patient has a contact number available for                            emergencies. The signs and symptoms of potential                            delayed complications were discussed with the  patient. Return to normal activities tomorrow.                            Written discharge instructions were provided to the                            patient.                            - Resume previous diet.                           - Continue present medications.                           - Await pathology results.                           - Repeat upper endoscopy in 3 years for                            surveillance.                           - Return to GI clinic in 6 months.                           - Counseled on importance of taking PPI Procedure Code(s):        --- Professional ---                           [id number], Esophagogastroduodenoscopy, flexible,                            transoral; with biopsy, single or multiple Diagnosis Code(s):        --- Professional ---                           K22.70, Barrett's esophagus without dysplasia                           K21.00, Gastro-esophageal reflux disease with                            esophagitis, without bleeding                           Z98.0, Intestinal bypass and anastomosis status                           K31.5, Obstruction of duodenum CPT copyright 2022 American Medical Association. All rights reserved. The codes documented in this report are preliminary and upon coder review may  be revised to meet current compliance requirements. Carlin POUR. Cindie, DO Carlin POUR. Cindie, DO 11/05/2024 12:20:46 PM This report has been signed electronically. Number of Addenda: 0

## 2024-11-05 NOTE — Interval H&P Note (Signed)
 History and Physical Interval Note:  11/05/2024 11:18 AM  Jeremy [name]  has presented today for surgery, with the diagnosis of surveillance of barrett's esophagus.  The various methods of treatment have been discussed with the patient and family. After consideration of risks, benefits and other options for treatment, the patient has consented to  Procedures with comments: EGD (ESOPHAGOGASTRODUODENOSCOPY) (N/A) - 12:30pm, ASA 3- room 1 ok as a surgical intervention.  The patient's history has been reviewed, patient examined, no change in status, stable for surgery.  I have reviewed the patient's chart and labs.  Questions were answered to the patient's satisfaction.     Carlin MARLA Hasty

## 2024-11-05 NOTE — Anesthesia Postprocedure Evaluation (Signed)
"   Anesthesia Post Note  Patient: Jeremy [name]  Procedure(s) Performed: EGD (ESOPHAGOGASTRODUODENOSCOPY)  Patient location during evaluation: PACU Anesthesia Type: General Level of consciousness: awake and alert Pain management: pain level controlled Vital Signs Assessment: post-procedure vital signs reviewed and stable Respiratory status: spontaneous breathing, nonlabored ventilation, respiratory function stable and patient connected to nasal cannula oxygen Cardiovascular status: blood pressure returned to baseline and stable Postop Assessment: no apparent nausea or vomiting Anesthetic complications: no   No notable events documented.   Last Vitals:  Vitals:   11/05/24 1222 11/05/24 1226  BP: (!) 89/40 (!) 108/45  Pulse: (!) 59 65  Resp: 17 16  Temp:    SpO2: 99% 100%    Last Pain:  Vitals:   11/05/24 1226  TempSrc:   PainSc: 0-No pain                 Andrea Limes      "

## 2024-11-05 NOTE — Discharge Instructions (Addendum)
 EGD Discharge instructions Please read the instructions outlined below and refer to this sheet in the next few weeks. These discharge instructions provide you with general information on caring for yourself after you leave the hospital. Your doctor may also give you specific instructions. While your treatment has been planned according to the most current medical practices available, unavoidable complications occasionally occur. If you have any problems or questions after discharge, please call your doctor. ACTIVITY You may resume your regular activity but move at a slower pace for the next 24 hours.  Take frequent rest periods for the next 24 hours.  Walking will help expel (get rid of) the air and reduce the bloated feeling in your abdomen.  No driving for 24 hours (because of the anesthesia (medicine) used during the test).  You may shower.  Do not sign any important legal documents or operate any machinery for 24 hours (because of the anesthesia used during the test).  NUTRITION Drink plenty of fluids.  You may resume your normal diet.  Begin with a light meal and progress to your normal diet.  Avoid alcoholic beverages for 24 hours or as instructed by your caregiver.  MEDICATIONS You may resume your normal medications unless your caregiver tells you otherwise.  WHAT YOU CAN EXPECT TODAY You may experience abdominal discomfort such as a feeling of fullness or gas pains.  FOLLOW-UP Your doctor will discuss the results of your test with you.  SEEK IMMEDIATE MEDICAL ATTENTION IF ANY OF THE FOLLOWING OCCUR: Excessive nausea (feeling sick to your stomach) and/or vomiting.  Severe abdominal pain and distention (swelling).  Trouble swallowing.  Temperature over 101 F (37.8 C).  Rectal bleeding or vomiting of blood.   Your EGD revealed evidence of known Barrett's esophagus. I took biopsies today of the entire segment. You also had evidence of reflux esophagitis. Previous surgical site in  your stomach looked healthy and was widely patent.   Await pathology results, my office will contact you  It is important that you take your pantoprazole  every day.  Follow-up in GI office in 6 months.     I hope you have a great rest of your week!  Carlin POUR. Cindie, D.O. Gastroenterology and Hepatology [facility]

## 2024-11-05 NOTE — Anesthesia Preprocedure Evaluation (Signed)
"                                    Anesthesia Evaluation    Airway Mallampati: II  TM Distance: >3 FB Neck ROM: Full    Dental no notable dental hx.    Pulmonary sleep apnea , former smoker   Pulmonary exam normal breath sounds clear to auscultation       Cardiovascular hypertension, + CAD  Normal cardiovascular exam+ Valvular Problems/Murmurs  Rhythm:Regular Rate:Normal     Neuro/Psych  Neuromuscular disease    GI/Hepatic PUD,GERD  ,,  Endo/Other  diabetes    Renal/GU      Musculoskeletal  (+) Arthritis , Osteoarthritis,    Abdominal   Peds  Hematology  (+) Blood dyscrasia, anemia   Anesthesia Other Findings   Reproductive/Obstetrics                              Anesthesia Physical Anesthesia Plan  ASA: 3  Anesthesia Plan: General   Post-op Pain Management:    Induction: Intravenous  PONV Risk Score and Plan:   Airway Management Planned: Nasal Cannula and Natural Airway  Additional Equipment:   Intra-op Plan:   Post-operative Plan:   Informed Consent: I have reviewed the patients History and Physical, chart, labs and discussed the procedure including the risks, benefits and alternatives for the proposed anesthesia with the patient or authorized representative who has indicated his/her understanding and acceptance.     Dental advisory given  Plan Discussed with: CRNA  Anesthesia Plan Comments:         Anesthesia Quick Evaluation  "

## 2024-11-06 ENCOUNTER — Encounter (HOSPITAL_COMMUNITY): Payer: Self-pay | Admitting: Internal Medicine

## 2024-11-06 ENCOUNTER — Other Ambulatory Visit: Payer: Self-pay

## 2024-11-07 ENCOUNTER — Other Ambulatory Visit: Payer: Self-pay

## 2024-11-08 LAB — SURGICAL PATHOLOGY

## 2024-11-10 ENCOUNTER — Other Ambulatory Visit: Payer: Self-pay

## 2024-11-10 ENCOUNTER — Encounter: Payer: Self-pay | Admitting: Physician Assistant

## 2024-11-11 ENCOUNTER — Other Ambulatory Visit: Payer: Self-pay

## 2024-11-11 MED ORDER — PENICILLIN V POTASSIUM 500 MG PO TABS
500.0000 mg | ORAL_TABLET | Freq: Four times a day (QID) | ORAL | 0 refills | Status: AC
  Filled 2024-11-11: qty 56, 14d supply, fill #0

## 2024-11-11 MED ORDER — CHLORHEXIDINE GLUCONATE 0.12 % MT SOLN
15.0000 mL | Freq: Two times a day (BID) | OROMUCOSAL | 2 refills | Status: AC
  Filled 2024-11-11: qty 473, 16d supply, fill #0

## 2024-11-14 ENCOUNTER — Ambulatory Visit: Payer: Self-pay | Admitting: Internal Medicine

## 2024-11-16 ENCOUNTER — Encounter: Payer: Self-pay | Admitting: Internal Medicine

## 2024-11-17 ENCOUNTER — Encounter: Payer: Self-pay | Admitting: Sports Medicine

## 2024-11-18 ENCOUNTER — Other Ambulatory Visit: Payer: Self-pay | Admitting: Internal Medicine

## 2024-11-18 DIAGNOSIS — G4733 Obstructive sleep apnea (adult) (pediatric): Secondary | ICD-10-CM

## 2025-04-20 ENCOUNTER — Inpatient Hospital Stay

## 2025-04-20 ENCOUNTER — Inpatient Hospital Stay: Admitting: Internal Medicine
